# Patient Record
Sex: Male | Born: 1935 | Race: White | Hispanic: No | Smoking: Former smoker
Health system: Southern US, Community
[De-identification: ages and names within clinical notes are randomized; demographics above are authoritative.]

## PROBLEM LIST (undated history)

## (undated) DIAGNOSIS — D61818 Other pancytopenia: Secondary | ICD-10-CM

## (undated) DIAGNOSIS — IMO0002 Reserved for concepts with insufficient information to code with codable children: Secondary | ICD-10-CM

## (undated) DIAGNOSIS — Z952 Presence of prosthetic heart valve: Secondary | ICD-10-CM

## (undated) DIAGNOSIS — I350 Nonrheumatic aortic (valve) stenosis: Secondary | ICD-10-CM

## (undated) DIAGNOSIS — N183 Chronic kidney disease, stage 3 unspecified: Secondary | ICD-10-CM

## (undated) DIAGNOSIS — L0390 Cellulitis, unspecified: Secondary | ICD-10-CM

## (undated) DIAGNOSIS — E7800 Pure hypercholesterolemia, unspecified: Secondary | ICD-10-CM

## (undated) DIAGNOSIS — I5032 Chronic diastolic (congestive) heart failure: Secondary | ICD-10-CM

## (undated) DIAGNOSIS — E039 Hypothyroidism, unspecified: Secondary | ICD-10-CM

## (undated) DIAGNOSIS — I480 Paroxysmal atrial fibrillation: Secondary | ICD-10-CM

## (undated) DIAGNOSIS — I639 Cerebral infarction, unspecified: Secondary | ICD-10-CM

## (undated) DIAGNOSIS — L03116 Cellulitis of left lower limb: Secondary | ICD-10-CM

## (undated) DIAGNOSIS — R161 Splenomegaly, not elsewhere classified: Secondary | ICD-10-CM

## (undated) DIAGNOSIS — M7120 Synovial cyst of popliteal space [Baker], unspecified knee: Secondary | ICD-10-CM

## (undated) DIAGNOSIS — Z9289 Personal history of other medical treatment: Secondary | ICD-10-CM

## (undated) DIAGNOSIS — G252 Other specified forms of tremor: Secondary | ICD-10-CM

## (undated) DIAGNOSIS — R9431 Abnormal electrocardiogram [ECG] [EKG]: Secondary | ICD-10-CM

## (undated) DIAGNOSIS — R7881 Bacteremia: Secondary | ICD-10-CM

## (undated) DIAGNOSIS — K5790 Diverticulosis of intestine, part unspecified, without perforation or abscess without bleeding: Secondary | ICD-10-CM

## (undated) DIAGNOSIS — D696 Thrombocytopenia, unspecified: Secondary | ICD-10-CM

## (undated) DIAGNOSIS — I959 Hypotension, unspecified: Secondary | ICD-10-CM

## (undated) DIAGNOSIS — M25469 Effusion, unspecified knee: Secondary | ICD-10-CM

## (undated) DIAGNOSIS — D72819 Decreased white blood cell count, unspecified: Secondary | ICD-10-CM

## (undated) DIAGNOSIS — Z8719 Personal history of other diseases of the digestive system: Secondary | ICD-10-CM

## (undated) DIAGNOSIS — I10 Essential (primary) hypertension: Secondary | ICD-10-CM

## (undated) DIAGNOSIS — C140 Malignant neoplasm of pharynx, unspecified: Secondary | ICD-10-CM

## (undated) DIAGNOSIS — K219 Gastro-esophageal reflux disease without esophagitis: Secondary | ICD-10-CM

## (undated) DIAGNOSIS — F419 Anxiety disorder, unspecified: Secondary | ICD-10-CM

## (undated) DIAGNOSIS — K635 Polyp of colon: Secondary | ICD-10-CM

## (undated) DIAGNOSIS — R001 Bradycardia, unspecified: Secondary | ICD-10-CM

## (undated) HISTORY — DX: Effusion, unspecified knee: M25.469

## (undated) HISTORY — DX: Polyp of colon: K63.5

## (undated) HISTORY — DX: Anxiety disorder, unspecified: F41.9

## (undated) HISTORY — DX: Abnormal electrocardiogram (ECG) (EKG): R94.31

## (undated) HISTORY — DX: Other specified forms of tremor: G25.2

## (undated) HISTORY — PX: EXCISIONAL HEMORRHOIDECTOMY: SHX1541

## (undated) HISTORY — DX: Malignant neoplasm of pharynx, unspecified: C14.0

## (undated) HISTORY — DX: Synovial cyst of popliteal space (Baker), unspecified knee: M71.20

## (undated) HISTORY — PX: SURGERY SCROTAL / TESTICULAR: SUR1316

## (undated) HISTORY — PX: CHOLECYSTECTOMY: SHX55

## (undated) HISTORY — PX: CARDIAC CATHETERIZATION: SHX172

## (undated) HISTORY — PX: OTHER SURGICAL HISTORY: SHX169

## (undated) HISTORY — DX: Cerebral infarction, unspecified: I63.9

## (undated) HISTORY — DX: Paroxysmal atrial fibrillation: I48.0

## (undated) HISTORY — DX: Other pancytopenia: D61.818

## (undated) HISTORY — PX: CARDIAC VALVE REPLACEMENT: SHX585

## (undated) HISTORY — DX: Splenomegaly, not elsewhere classified: R16.1

## (undated) HISTORY — DX: Diverticulosis of intestine, part unspecified, without perforation or abscess without bleeding: K57.90

## (undated) HISTORY — DX: Reserved for concepts with insufficient information to code with codable children: IMO0002

## (undated) HISTORY — DX: Essential (primary) hypertension: I10

## (undated) HISTORY — DX: Cellulitis of left lower limb: L03.116

## (undated) HISTORY — DX: Decreased white blood cell count, unspecified: D72.819

## (undated) HISTORY — DX: Hypothyroidism, unspecified: E03.9

## (undated) HISTORY — DX: Gastro-esophageal reflux disease without esophagitis: K21.9

## (undated) HISTORY — DX: Thrombocytopenia, unspecified: D69.6

## (undated) HISTORY — DX: Nonrheumatic aortic (valve) stenosis: I35.0

## (undated) HISTORY — PX: INGUINAL HERNIA REPAIR: SUR1180

## (undated) HISTORY — DX: Hypotension, unspecified: I95.9

## (undated) HISTORY — DX: Bradycardia, unspecified: R00.1

---

## 1978-09-09 HISTORY — PX: MICROLARYNGOSCOPY WITH CO2 LASER AND EXCISION OF VOCAL CORD LESION: SHX5970

## 1997-10-05 ENCOUNTER — Inpatient Hospital Stay (HOSPITAL_COMMUNITY): Admission: EM | Admit: 1997-10-05 | Discharge: 1997-10-06 | Payer: Self-pay | Admitting: Internal Medicine

## 1998-05-06 ENCOUNTER — Other Ambulatory Visit: Admission: RE | Admit: 1998-05-06 | Discharge: 1998-05-06 | Payer: Self-pay | Admitting: Internal Medicine

## 1998-05-09 ENCOUNTER — Ambulatory Visit (HOSPITAL_COMMUNITY): Admission: RE | Admit: 1998-05-09 | Discharge: 1998-05-09 | Payer: Self-pay | Admitting: Internal Medicine

## 1998-05-09 ENCOUNTER — Encounter: Payer: Self-pay | Admitting: Internal Medicine

## 1998-08-10 ENCOUNTER — Other Ambulatory Visit: Admission: RE | Admit: 1998-08-10 | Discharge: 1998-08-10 | Payer: Self-pay | Admitting: Oncology

## 1999-04-20 ENCOUNTER — Inpatient Hospital Stay (HOSPITAL_COMMUNITY): Admission: EM | Admit: 1999-04-20 | Discharge: 1999-04-22 | Payer: Self-pay | Admitting: Emergency Medicine

## 1999-04-20 ENCOUNTER — Encounter: Payer: Self-pay | Admitting: Emergency Medicine

## 1999-05-09 ENCOUNTER — Encounter (INDEPENDENT_AMBULATORY_CARE_PROVIDER_SITE_OTHER): Payer: Self-pay

## 1999-05-09 ENCOUNTER — Other Ambulatory Visit: Admission: RE | Admit: 1999-05-09 | Discharge: 1999-05-09 | Payer: Self-pay | Admitting: Internal Medicine

## 2000-07-18 ENCOUNTER — Inpatient Hospital Stay (HOSPITAL_COMMUNITY): Admission: EM | Admit: 2000-07-18 | Discharge: 2000-07-19 | Payer: Self-pay | Admitting: Emergency Medicine

## 2000-07-19 ENCOUNTER — Encounter: Payer: Self-pay | Admitting: Cardiology

## 2000-08-30 ENCOUNTER — Other Ambulatory Visit: Admission: RE | Admit: 2000-08-30 | Discharge: 2000-08-30 | Payer: Self-pay | Admitting: Internal Medicine

## 2000-08-30 ENCOUNTER — Encounter (INDEPENDENT_AMBULATORY_CARE_PROVIDER_SITE_OTHER): Payer: Self-pay | Admitting: Specialist

## 2002-04-07 ENCOUNTER — Ambulatory Visit (HOSPITAL_COMMUNITY): Admission: RE | Admit: 2002-04-07 | Discharge: 2002-04-07 | Payer: Self-pay | Admitting: Physician Assistant

## 2002-04-07 ENCOUNTER — Encounter: Payer: Self-pay | Admitting: Internal Medicine

## 2002-05-18 ENCOUNTER — Inpatient Hospital Stay (HOSPITAL_COMMUNITY): Admission: AD | Admit: 2002-05-18 | Discharge: 2002-05-20 | Payer: Self-pay | Admitting: Cardiology

## 2002-09-12 ENCOUNTER — Ambulatory Visit (HOSPITAL_COMMUNITY): Admission: RE | Admit: 2002-09-12 | Discharge: 2002-09-12 | Payer: Self-pay | Admitting: Neurosurgery

## 2002-09-12 ENCOUNTER — Encounter: Payer: Self-pay | Admitting: Neurosurgery

## 2002-10-29 ENCOUNTER — Encounter: Payer: Self-pay | Admitting: Neurosurgery

## 2002-10-29 ENCOUNTER — Encounter: Admission: RE | Admit: 2002-10-29 | Discharge: 2002-10-29 | Payer: Self-pay | Admitting: Neurosurgery

## 2002-11-13 ENCOUNTER — Encounter: Admission: RE | Admit: 2002-11-13 | Discharge: 2002-11-13 | Payer: Self-pay | Admitting: Neurosurgery

## 2002-12-07 ENCOUNTER — Encounter: Admission: RE | Admit: 2002-12-07 | Discharge: 2002-12-07 | Payer: Self-pay | Admitting: Neurosurgery

## 2002-12-24 ENCOUNTER — Inpatient Hospital Stay (HOSPITAL_COMMUNITY): Admission: AD | Admit: 2002-12-24 | Discharge: 2002-12-25 | Payer: Self-pay | Admitting: Cardiology

## 2003-03-05 ENCOUNTER — Inpatient Hospital Stay (HOSPITAL_COMMUNITY): Admission: AD | Admit: 2003-03-05 | Discharge: 2003-03-09 | Payer: Self-pay | Admitting: Cardiology

## 2003-04-28 ENCOUNTER — Encounter: Admission: RE | Admit: 2003-04-28 | Discharge: 2003-04-28 | Payer: Self-pay | Admitting: Internal Medicine

## 2003-09-10 ENCOUNTER — Inpatient Hospital Stay (HOSPITAL_COMMUNITY): Admission: EM | Admit: 2003-09-10 | Discharge: 2003-09-15 | Payer: Self-pay | Admitting: Emergency Medicine

## 2003-09-10 ENCOUNTER — Ambulatory Visit: Payer: Self-pay | Admitting: Infectious Diseases

## 2003-11-25 ENCOUNTER — Ambulatory Visit: Payer: Self-pay | Admitting: Cardiology

## 2003-11-25 ENCOUNTER — Ambulatory Visit: Payer: Self-pay

## 2003-12-16 ENCOUNTER — Ambulatory Visit: Payer: Self-pay | Admitting: Cardiology

## 2004-01-06 ENCOUNTER — Ambulatory Visit: Payer: Self-pay | Admitting: *Deleted

## 2004-01-11 ENCOUNTER — Ambulatory Visit: Payer: Self-pay

## 2004-02-14 ENCOUNTER — Ambulatory Visit: Payer: Self-pay | Admitting: *Deleted

## 2004-03-13 ENCOUNTER — Ambulatory Visit: Payer: Self-pay | Admitting: Cardiology

## 2004-03-27 ENCOUNTER — Ambulatory Visit: Payer: Self-pay | Admitting: Cardiology

## 2004-04-06 ENCOUNTER — Ambulatory Visit: Payer: Self-pay | Admitting: Internal Medicine

## 2004-04-17 ENCOUNTER — Ambulatory Visit: Payer: Self-pay | Admitting: Cardiology

## 2004-04-25 ENCOUNTER — Ambulatory Visit: Payer: Self-pay | Admitting: Cardiology

## 2004-05-16 ENCOUNTER — Ambulatory Visit: Payer: Self-pay | Admitting: Cardiology

## 2004-05-24 ENCOUNTER — Ambulatory Visit: Payer: Self-pay | Admitting: Cardiology

## 2004-05-30 ENCOUNTER — Ambulatory Visit: Payer: Self-pay | Admitting: Cardiology

## 2004-06-20 ENCOUNTER — Ambulatory Visit: Payer: Self-pay | Admitting: Internal Medicine

## 2004-07-13 ENCOUNTER — Ambulatory Visit: Payer: Self-pay | Admitting: Cardiology

## 2004-08-01 ENCOUNTER — Ambulatory Visit: Payer: Self-pay | Admitting: Internal Medicine

## 2004-08-10 ENCOUNTER — Ambulatory Visit: Payer: Self-pay | Admitting: Cardiology

## 2004-08-21 ENCOUNTER — Ambulatory Visit: Payer: Self-pay | Admitting: Internal Medicine

## 2004-09-07 ENCOUNTER — Ambulatory Visit: Payer: Self-pay | Admitting: Internal Medicine

## 2004-09-28 ENCOUNTER — Ambulatory Visit: Payer: Self-pay | Admitting: Cardiology

## 2004-10-06 ENCOUNTER — Ambulatory Visit: Payer: Self-pay | Admitting: Cardiology

## 2004-10-06 ENCOUNTER — Encounter: Admission: RE | Admit: 2004-10-06 | Discharge: 2004-10-06 | Payer: Self-pay | Admitting: Neurosurgery

## 2004-10-10 ENCOUNTER — Ambulatory Visit: Payer: Self-pay | Admitting: Internal Medicine

## 2004-10-16 ENCOUNTER — Ambulatory Visit: Payer: Self-pay | Admitting: Cardiology

## 2004-10-25 ENCOUNTER — Ambulatory Visit: Payer: Self-pay | Admitting: Cardiology

## 2004-10-25 ENCOUNTER — Encounter: Admission: RE | Admit: 2004-10-25 | Discharge: 2004-10-25 | Payer: Self-pay | Admitting: Neurosurgery

## 2004-11-01 ENCOUNTER — Ambulatory Visit: Payer: Self-pay | Admitting: Cardiology

## 2004-11-08 ENCOUNTER — Encounter: Admission: RE | Admit: 2004-11-08 | Discharge: 2004-11-08 | Payer: Self-pay | Admitting: Neurosurgery

## 2004-11-08 ENCOUNTER — Ambulatory Visit: Payer: Self-pay | Admitting: *Deleted

## 2004-11-15 ENCOUNTER — Ambulatory Visit: Payer: Self-pay | Admitting: Cardiology

## 2004-11-27 ENCOUNTER — Ambulatory Visit: Payer: Self-pay | Admitting: Cardiology

## 2004-12-13 ENCOUNTER — Ambulatory Visit: Payer: Self-pay | Admitting: *Deleted

## 2004-12-13 ENCOUNTER — Ambulatory Visit: Payer: Self-pay | Admitting: Cardiology

## 2004-12-13 ENCOUNTER — Ambulatory Visit: Payer: Self-pay

## 2004-12-27 ENCOUNTER — Ambulatory Visit: Payer: Self-pay | Admitting: Cardiology

## 2004-12-28 ENCOUNTER — Ambulatory Visit: Payer: Self-pay | Admitting: Cardiology

## 2005-02-08 ENCOUNTER — Ambulatory Visit: Payer: Self-pay | Admitting: Cardiology

## 2005-02-22 ENCOUNTER — Ambulatory Visit: Payer: Self-pay | Admitting: Internal Medicine

## 2005-03-15 ENCOUNTER — Ambulatory Visit: Payer: Self-pay | Admitting: Cardiology

## 2005-03-26 ENCOUNTER — Ambulatory Visit: Payer: Self-pay | Admitting: Internal Medicine

## 2005-03-29 ENCOUNTER — Ambulatory Visit: Payer: Self-pay | Admitting: Cardiology

## 2005-04-11 ENCOUNTER — Ambulatory Visit: Payer: Self-pay | Admitting: Cardiology

## 2005-05-02 ENCOUNTER — Ambulatory Visit: Payer: Self-pay | Admitting: Cardiology

## 2005-05-16 ENCOUNTER — Ambulatory Visit: Payer: Self-pay | Admitting: Cardiology

## 2005-05-30 ENCOUNTER — Ambulatory Visit: Payer: Self-pay | Admitting: Cardiology

## 2005-06-07 ENCOUNTER — Encounter: Payer: Self-pay | Admitting: Cardiology

## 2005-06-07 ENCOUNTER — Ambulatory Visit: Payer: Self-pay

## 2005-06-27 ENCOUNTER — Ambulatory Visit: Payer: Self-pay | Admitting: Cardiovascular Disease

## 2005-07-02 ENCOUNTER — Ambulatory Visit: Payer: Self-pay | Admitting: Internal Medicine

## 2005-07-17 ENCOUNTER — Encounter: Admission: RE | Admit: 2005-07-17 | Discharge: 2005-07-17 | Payer: Self-pay | Admitting: Neurosurgery

## 2005-07-25 ENCOUNTER — Ambulatory Visit: Payer: Self-pay | Admitting: Cardiovascular Disease

## 2005-08-14 ENCOUNTER — Encounter: Admission: RE | Admit: 2005-08-14 | Discharge: 2005-08-14 | Payer: Self-pay | Admitting: Neurosurgery

## 2005-08-14 ENCOUNTER — Ambulatory Visit: Payer: Self-pay | Admitting: Cardiology

## 2005-08-23 ENCOUNTER — Ambulatory Visit: Payer: Self-pay | Admitting: Cardiology

## 2005-09-05 ENCOUNTER — Encounter: Admission: RE | Admit: 2005-09-05 | Discharge: 2005-09-05 | Payer: Self-pay | Admitting: Neurosurgery

## 2005-09-05 ENCOUNTER — Ambulatory Visit: Payer: Self-pay | Admitting: Cardiology

## 2005-09-14 ENCOUNTER — Ambulatory Visit: Payer: Self-pay | Admitting: Cardiology

## 2005-09-25 ENCOUNTER — Encounter: Admission: RE | Admit: 2005-09-25 | Discharge: 2005-09-25 | Payer: Self-pay | Admitting: Neurosurgery

## 2005-09-25 ENCOUNTER — Ambulatory Visit: Payer: Self-pay | Admitting: Cardiology

## 2005-10-04 ENCOUNTER — Ambulatory Visit: Payer: Self-pay | Admitting: Cardiology

## 2005-10-18 ENCOUNTER — Ambulatory Visit: Payer: Self-pay | Admitting: Cardiology

## 2005-11-07 ENCOUNTER — Ambulatory Visit: Payer: Self-pay | Admitting: Cardiology

## 2005-12-04 ENCOUNTER — Ambulatory Visit: Payer: Self-pay | Admitting: Cardiology

## 2006-01-10 ENCOUNTER — Emergency Department (HOSPITAL_COMMUNITY): Admission: EM | Admit: 2006-01-10 | Discharge: 2006-01-11 | Payer: Self-pay | Admitting: Emergency Medicine

## 2006-01-14 ENCOUNTER — Ambulatory Visit: Payer: Self-pay | Admitting: Cardiology

## 2006-02-07 ENCOUNTER — Ambulatory Visit: Payer: Self-pay | Admitting: Cardiology

## 2006-02-20 ENCOUNTER — Ambulatory Visit: Payer: Self-pay | Admitting: Cardiology

## 2006-02-28 ENCOUNTER — Ambulatory Visit: Payer: Self-pay | Admitting: Cardiology

## 2006-03-18 ENCOUNTER — Ambulatory Visit: Payer: Self-pay | Admitting: Cardiology

## 2006-03-28 ENCOUNTER — Ambulatory Visit: Payer: Self-pay | Admitting: Cardiology

## 2006-04-04 ENCOUNTER — Ambulatory Visit: Payer: Self-pay | Admitting: Cardiology

## 2006-04-18 ENCOUNTER — Ambulatory Visit: Payer: Self-pay | Admitting: Cardiology

## 2006-05-01 ENCOUNTER — Ambulatory Visit: Payer: Self-pay | Admitting: *Deleted

## 2006-05-01 ENCOUNTER — Ambulatory Visit: Payer: Self-pay | Admitting: Cardiology

## 2006-05-13 DIAGNOSIS — IMO0002 Reserved for concepts with insufficient information to code with codable children: Secondary | ICD-10-CM | POA: Insufficient documentation

## 2006-05-13 DIAGNOSIS — F411 Generalized anxiety disorder: Secondary | ICD-10-CM | POA: Insufficient documentation

## 2006-05-13 DIAGNOSIS — D696 Thrombocytopenia, unspecified: Secondary | ICD-10-CM

## 2006-05-13 DIAGNOSIS — R161 Splenomegaly, not elsewhere classified: Secondary | ICD-10-CM | POA: Insufficient documentation

## 2006-05-13 DIAGNOSIS — I6350 Cerebral infarction due to unspecified occlusion or stenosis of unspecified cerebral artery: Secondary | ICD-10-CM | POA: Insufficient documentation

## 2006-05-13 DIAGNOSIS — M109 Gout, unspecified: Secondary | ICD-10-CM | POA: Insufficient documentation

## 2006-05-13 DIAGNOSIS — I10 Essential (primary) hypertension: Secondary | ICD-10-CM | POA: Insufficient documentation

## 2006-05-14 ENCOUNTER — Ambulatory Visit: Payer: Self-pay | Admitting: Internal Medicine

## 2006-05-20 ENCOUNTER — Ambulatory Visit: Payer: Self-pay | Admitting: Internal Medicine

## 2006-05-20 LAB — CONVERTED CEMR LAB
Basophils Relative: 0.1 % (ref 0.0–1.0)
Eosinophils Relative: 1.5 % (ref 0.0–5.0)
HCT: 40.5 % (ref 39.0–52.0)
Hemoglobin: 14 g/dL (ref 13.0–17.0)
Lymphocytes Relative: 20.3 % (ref 12.0–46.0)
Monocytes Absolute: 0.2 10*3/uL (ref 0.2–0.7)
Monocytes Relative: 4.4 % (ref 3.0–11.0)
Neutro Abs: 2.6 10*3/uL (ref 1.4–7.7)
Neutrophils Relative %: 73.7 % (ref 43.0–77.0)
Prothrombin Time: 22.4 s — ABNORMAL HIGH (ref 10.0–14.0)
RDW: 15.9 % — ABNORMAL HIGH (ref 11.5–14.6)
WBC: 3.6 10*3/uL — ABNORMAL LOW (ref 4.5–10.5)

## 2006-05-24 ENCOUNTER — Ambulatory Visit: Payer: Self-pay | Admitting: Cardiology

## 2006-05-28 ENCOUNTER — Ambulatory Visit: Payer: Self-pay | Admitting: Cardiology

## 2006-06-11 ENCOUNTER — Ambulatory Visit: Payer: Self-pay | Admitting: Cardiology

## 2006-06-18 ENCOUNTER — Ambulatory Visit: Payer: Self-pay | Admitting: Cardiology

## 2006-07-16 ENCOUNTER — Ambulatory Visit: Payer: Self-pay | Admitting: Cardiology

## 2006-08-13 ENCOUNTER — Ambulatory Visit: Payer: Self-pay | Admitting: Cardiology

## 2006-09-10 ENCOUNTER — Ambulatory Visit: Payer: Self-pay | Admitting: Cardiology

## 2006-09-12 ENCOUNTER — Ambulatory Visit: Payer: Self-pay | Admitting: Cardiology

## 2006-10-09 ENCOUNTER — Ambulatory Visit: Payer: Self-pay | Admitting: Cardiology

## 2006-11-05 ENCOUNTER — Ambulatory Visit: Payer: Self-pay | Admitting: Internal Medicine

## 2006-11-05 DIAGNOSIS — M7120 Synovial cyst of popliteal space [Baker], unspecified knee: Secondary | ICD-10-CM

## 2006-11-11 ENCOUNTER — Encounter: Admission: RE | Admit: 2006-11-11 | Discharge: 2006-11-11 | Payer: Self-pay | Admitting: Internal Medicine

## 2006-11-12 ENCOUNTER — Encounter (INDEPENDENT_AMBULATORY_CARE_PROVIDER_SITE_OTHER): Payer: Self-pay | Admitting: *Deleted

## 2006-11-14 ENCOUNTER — Ambulatory Visit: Payer: Self-pay | Admitting: Cardiology

## 2006-11-14 LAB — CONVERTED CEMR LAB
Albumin: 4.5 g/dL (ref 3.5–5.2)
Alkaline Phosphatase: 80 units/L (ref 39–117)
BUN: 25 mg/dL — ABNORMAL HIGH (ref 6–23)
Basophils Absolute: 0 10*3/uL (ref 0.0–0.1)
Free T4: 0.9 ng/dL (ref 0.6–1.6)
GFR calc Af Amer: 53 mL/min
HCT: 42.2 % (ref 39.0–52.0)
Hemoglobin: 14.5 g/dL (ref 13.0–17.0)
Lymphocytes Relative: 23.7 % (ref 12.0–46.0)
MCHC: 34.4 g/dL (ref 30.0–36.0)
MCV: 88.9 fL (ref 78.0–100.0)
Monocytes Absolute: 0.3 10*3/uL (ref 0.2–0.7)
Monocytes Relative: 6.6 % (ref 3.0–11.0)
Neutro Abs: 2.7 10*3/uL (ref 1.4–7.7)
Neutrophils Relative %: 68 % (ref 43.0–77.0)
Potassium: 4.6 meq/L (ref 3.5–5.1)
Sodium: 141 meq/L (ref 135–145)
TSH: 6.81 microintl units/mL — ABNORMAL HIGH (ref 0.35–5.50)
Total Bilirubin: 0.9 mg/dL (ref 0.3–1.2)
Total Protein: 7.1 g/dL (ref 6.0–8.3)

## 2006-11-19 ENCOUNTER — Ambulatory Visit: Payer: Self-pay

## 2006-12-12 ENCOUNTER — Ambulatory Visit: Payer: Self-pay | Admitting: Cardiovascular Disease

## 2007-01-10 ENCOUNTER — Ambulatory Visit: Payer: Self-pay | Admitting: Cardiology

## 2007-01-13 ENCOUNTER — Telehealth (INDEPENDENT_AMBULATORY_CARE_PROVIDER_SITE_OTHER): Payer: Self-pay | Admitting: *Deleted

## 2007-01-14 ENCOUNTER — Ambulatory Visit: Payer: Self-pay | Admitting: Internal Medicine

## 2007-01-14 DIAGNOSIS — G250 Essential tremor: Secondary | ICD-10-CM

## 2007-01-14 DIAGNOSIS — L0390 Cellulitis, unspecified: Secondary | ICD-10-CM

## 2007-01-14 DIAGNOSIS — G252 Other specified forms of tremor: Secondary | ICD-10-CM

## 2007-01-14 DIAGNOSIS — L0291 Cutaneous abscess, unspecified: Secondary | ICD-10-CM

## 2007-01-16 ENCOUNTER — Telehealth (INDEPENDENT_AMBULATORY_CARE_PROVIDER_SITE_OTHER): Payer: Self-pay | Admitting: *Deleted

## 2007-01-17 ENCOUNTER — Telehealth (INDEPENDENT_AMBULATORY_CARE_PROVIDER_SITE_OTHER): Payer: Self-pay | Admitting: *Deleted

## 2007-01-17 LAB — CONVERTED CEMR LAB
Basophils Relative: 0.3 % (ref 0.0–1.0)
Eosinophils Relative: 0.9 % (ref 0.0–5.0)
Hemoglobin: 12.4 g/dL — ABNORMAL LOW (ref 13.0–17.0)
Lymphocytes Relative: 16.2 % (ref 12.0–46.0)
Monocytes Relative: 3.2 % (ref 3.0–11.0)
Neutro Abs: 2.7 10*3/uL (ref 1.4–7.7)
Platelets: 76 10*3/uL — ABNORMAL LOW (ref 150–400)
RDW: 14.9 % — ABNORMAL HIGH (ref 11.5–14.6)
Uric Acid, Serum: 4.5 mg/dL (ref 2.4–7.0)
WBC: 3.5 10*3/uL — ABNORMAL LOW (ref 4.5–10.5)

## 2007-02-10 ENCOUNTER — Ambulatory Visit: Payer: Self-pay | Admitting: Cardiology

## 2007-02-10 ENCOUNTER — Ambulatory Visit: Payer: Self-pay | Admitting: Internal Medicine

## 2007-02-18 ENCOUNTER — Ambulatory Visit: Payer: Self-pay | Admitting: Cardiovascular Disease

## 2007-03-04 ENCOUNTER — Ambulatory Visit: Payer: Self-pay | Admitting: Cardiology

## 2007-03-18 ENCOUNTER — Ambulatory Visit: Payer: Self-pay | Admitting: Cardiology

## 2007-04-15 ENCOUNTER — Ambulatory Visit: Payer: Self-pay | Admitting: Cardiovascular Disease

## 2007-04-15 ENCOUNTER — Ambulatory Visit: Payer: Self-pay | Admitting: Cardiology

## 2007-04-15 LAB — CONVERTED CEMR LAB
ALT: 32 units/L (ref 0–53)
AST: 30 units/L (ref 0–37)
Alkaline Phosphatase: 71 units/L (ref 39–117)
Bilirubin, Direct: 0.1 mg/dL (ref 0.0–0.3)
CO2: 28 meq/L (ref 19–32)
Chloride: 106 meq/L (ref 96–112)
Glucose, Bld: 127 mg/dL — ABNORMAL HIGH (ref 70–99)
Potassium: 4.3 meq/L (ref 3.5–5.1)
Sodium: 140 meq/L (ref 135–145)
TSH: 7.04 microintl units/mL — ABNORMAL HIGH (ref 0.35–5.50)
Total Bilirubin: 0.8 mg/dL (ref 0.3–1.2)
Total Protein: 6.7 g/dL (ref 6.0–8.3)

## 2007-04-28 ENCOUNTER — Ambulatory Visit: Payer: Self-pay

## 2007-04-28 ENCOUNTER — Encounter: Payer: Self-pay | Admitting: Cardiology

## 2007-05-13 ENCOUNTER — Ambulatory Visit: Payer: Self-pay | Admitting: Cardiovascular Disease

## 2007-05-23 ENCOUNTER — Emergency Department (HOSPITAL_COMMUNITY): Admission: EM | Admit: 2007-05-23 | Discharge: 2007-05-23 | Payer: Self-pay | Admitting: Emergency Medicine

## 2007-05-27 ENCOUNTER — Ambulatory Visit: Payer: Self-pay | Admitting: Internal Medicine

## 2007-06-10 ENCOUNTER — Ambulatory Visit: Payer: Self-pay | Admitting: Cardiovascular Disease

## 2007-06-12 ENCOUNTER — Ambulatory Visit: Payer: Self-pay | Admitting: Oncology

## 2007-06-12 ENCOUNTER — Ambulatory Visit: Payer: Self-pay | Admitting: Cardiology

## 2007-06-12 ENCOUNTER — Ambulatory Visit: Payer: Self-pay | Admitting: Internal Medicine

## 2007-06-12 ENCOUNTER — Inpatient Hospital Stay (HOSPITAL_COMMUNITY): Admission: AD | Admit: 2007-06-12 | Discharge: 2007-06-18 | Payer: Self-pay | Admitting: Internal Medicine

## 2007-06-12 ENCOUNTER — Telehealth (INDEPENDENT_AMBULATORY_CARE_PROVIDER_SITE_OTHER): Payer: Self-pay | Admitting: *Deleted

## 2007-06-13 ENCOUNTER — Encounter: Payer: Self-pay | Admitting: Internal Medicine

## 2007-06-20 ENCOUNTER — Ambulatory Visit: Payer: Self-pay | Admitting: Oncology

## 2007-06-23 ENCOUNTER — Ambulatory Visit: Payer: Self-pay | Admitting: Internal Medicine

## 2007-06-23 ENCOUNTER — Telehealth (INDEPENDENT_AMBULATORY_CARE_PROVIDER_SITE_OTHER): Payer: Self-pay | Admitting: *Deleted

## 2007-06-23 DIAGNOSIS — M79606 Pain in leg, unspecified: Secondary | ICD-10-CM

## 2007-06-23 DIAGNOSIS — D649 Anemia, unspecified: Secondary | ICD-10-CM

## 2007-06-25 ENCOUNTER — Encounter: Payer: Self-pay | Admitting: Internal Medicine

## 2007-06-26 ENCOUNTER — Encounter: Payer: Self-pay | Admitting: Internal Medicine

## 2007-06-26 ENCOUNTER — Ambulatory Visit: Payer: Self-pay

## 2007-06-26 ENCOUNTER — Ambulatory Visit: Payer: Self-pay | Admitting: Cardiology

## 2007-06-27 ENCOUNTER — Telehealth: Payer: Self-pay | Admitting: Internal Medicine

## 2007-06-27 LAB — CONVERTED CEMR LAB
Basophils Absolute: 0.3 10*3/uL — ABNORMAL HIGH (ref 0.0–0.1)
Basophils Relative: 4.9 % — ABNORMAL HIGH (ref 0.0–1.0)
HCT: 34.1 % — ABNORMAL LOW (ref 39.0–52.0)
Hemoglobin: 11.5 g/dL — ABNORMAL LOW (ref 13.0–17.0)
Lymphocytes Relative: 13.6 % (ref 12.0–46.0)
MCHC: 33.7 g/dL (ref 30.0–36.0)
MCV: 87.5 fL (ref 78.0–100.0)
Monocytes Absolute: 0.3 10*3/uL (ref 0.1–1.0)
Neutro Abs: 4.1 10*3/uL (ref 1.4–7.7)
RBC: 3.9 M/uL — ABNORMAL LOW (ref 4.22–5.81)
RDW: 16.2 % — ABNORMAL HIGH (ref 11.5–14.6)

## 2007-07-21 ENCOUNTER — Ambulatory Visit: Payer: Self-pay | Admitting: Cardiology

## 2007-07-21 ENCOUNTER — Ambulatory Visit: Payer: Self-pay | Admitting: Internal Medicine

## 2007-07-21 DIAGNOSIS — S81809A Unspecified open wound, unspecified lower leg, initial encounter: Secondary | ICD-10-CM | POA: Insufficient documentation

## 2007-07-31 ENCOUNTER — Encounter: Payer: Self-pay | Admitting: Internal Medicine

## 2007-07-31 ENCOUNTER — Telehealth (INDEPENDENT_AMBULATORY_CARE_PROVIDER_SITE_OTHER): Payer: Self-pay | Admitting: *Deleted

## 2007-07-31 LAB — CONVERTED CEMR LAB
Eosinophils Absolute: 0 10*3/uL (ref 0.0–0.7)
HCT: 38.9 % — ABNORMAL LOW (ref 39.0–52.0)
Monocytes Absolute: 0.2 10*3/uL (ref 0.1–1.0)
Monocytes Relative: 5 % (ref 3.0–12.0)
Neutrophils Relative %: 73.8 % (ref 43.0–77.0)
Platelets: 68 10*3/uL — ABNORMAL LOW (ref 150–400)
RDW: 17.5 % — ABNORMAL HIGH (ref 11.5–14.6)

## 2007-08-04 ENCOUNTER — Ambulatory Visit: Payer: Self-pay | Admitting: Internal Medicine

## 2007-08-04 DIAGNOSIS — D72819 Decreased white blood cell count, unspecified: Secondary | ICD-10-CM | POA: Insufficient documentation

## 2007-08-08 ENCOUNTER — Encounter: Payer: Self-pay | Admitting: Internal Medicine

## 2007-08-08 ENCOUNTER — Ambulatory Visit: Payer: Self-pay | Admitting: Oncology

## 2007-08-08 LAB — CBC & DIFF AND RETIC
Basophils Absolute: 0 10*3/uL (ref 0.0–0.1)
EOS%: 1.6 % (ref 0.0–7.0)
Eosinophils Absolute: 0 10*3/uL (ref 0.0–0.5)
HCT: 36.4 % — ABNORMAL LOW (ref 38.7–49.9)
HGB: 12.8 g/dL — ABNORMAL LOW (ref 13.0–17.1)
IRF: 0.32 (ref 0.070–0.380)
MCH: 30.5 pg (ref 28.0–33.4)
MCV: 87 fL (ref 81.6–98.0)
MONO%: 7 % (ref 0.0–13.0)
NEUT#: 1.4 10*3/uL — ABNORMAL LOW (ref 1.5–6.5)
NEUT%: 65.7 % (ref 40.0–75.0)
RETIC #: 37.7 10*3/uL (ref 31.8–103.9)
lymph#: 0.5 10*3/uL — ABNORMAL LOW (ref 0.9–3.3)

## 2007-08-08 LAB — COMPREHENSIVE METABOLIC PANEL
ALT: 29 U/L (ref 0–53)
AST: 29 U/L (ref 0–37)
Albumin: 4.3 g/dL (ref 3.5–5.2)
CO2: 25 mEq/L (ref 19–32)
Calcium: 8.7 mg/dL (ref 8.4–10.5)
Chloride: 106 mEq/L (ref 96–112)
Creatinine, Ser: 1.44 mg/dL (ref 0.40–1.50)
Potassium: 4.3 mEq/L (ref 3.5–5.3)

## 2007-08-08 LAB — MORPHOLOGY

## 2007-08-08 LAB — LACTATE DEHYDROGENASE: LDH: 141 U/L (ref 94–250)

## 2007-08-08 LAB — IRON AND TIBC: UIBC: 210 ug/dL

## 2007-09-08 ENCOUNTER — Ambulatory Visit: Payer: Self-pay | Admitting: Cardiology

## 2007-11-05 ENCOUNTER — Ambulatory Visit: Payer: Self-pay | Admitting: Cardiology

## 2007-11-05 LAB — CONVERTED CEMR LAB
Alkaline Phosphatase: 82 units/L (ref 39–117)
Bilirubin, Direct: 0.1 mg/dL (ref 0.0–0.3)
Chloride: 107 meq/L (ref 96–112)
GFR calc Af Amer: 53 mL/min
GFR calc non Af Amer: 44 mL/min
Glucose, Bld: 90 mg/dL (ref 70–99)
Potassium: 5.1 meq/L (ref 3.5–5.1)
Sodium: 143 meq/L (ref 135–145)

## 2007-12-03 ENCOUNTER — Ambulatory Visit: Payer: Self-pay | Admitting: Oncology

## 2007-12-08 ENCOUNTER — Encounter: Payer: Self-pay | Admitting: Internal Medicine

## 2007-12-08 LAB — CBC WITH DIFFERENTIAL/PLATELET
Basophils Absolute: 0 10*3/uL (ref 0.0–0.1)
EOS%: 1.8 % (ref 0.0–7.0)
Eosinophils Absolute: 0.1 10*3/uL (ref 0.0–0.5)
HCT: 37.6 % — ABNORMAL LOW (ref 38.7–49.9)
HGB: 13.2 g/dL (ref 13.0–17.1)
MCH: 31.5 pg (ref 28.0–33.4)
MCV: 89.9 fL (ref 81.6–98.0)
NEUT%: 66.8 % (ref 40.0–75.0)
lymph#: 0.9 10*3/uL (ref 0.9–3.3)

## 2008-02-25 ENCOUNTER — Ambulatory Visit: Payer: Self-pay | Admitting: Cardiology

## 2008-03-11 ENCOUNTER — Ambulatory Visit: Payer: Self-pay | Admitting: Internal Medicine

## 2008-03-11 DIAGNOSIS — M25469 Effusion, unspecified knee: Secondary | ICD-10-CM

## 2008-03-11 DIAGNOSIS — IMO0002 Reserved for concepts with insufficient information to code with codable children: Secondary | ICD-10-CM

## 2008-05-15 DIAGNOSIS — I359 Nonrheumatic aortic valve disorder, unspecified: Secondary | ICD-10-CM

## 2008-06-01 ENCOUNTER — Ambulatory Visit: Payer: Self-pay | Admitting: Oncology

## 2008-06-03 ENCOUNTER — Encounter: Payer: Self-pay | Admitting: Internal Medicine

## 2008-06-03 LAB — CBC WITH DIFFERENTIAL/PLATELET
BASO%: 0.4 % (ref 0.0–2.0)
Basophils Absolute: 0 10*3/uL (ref 0.0–0.1)
HCT: 35.3 % — ABNORMAL LOW (ref 38.4–49.9)
HGB: 12.1 g/dL — ABNORMAL LOW (ref 13.0–17.1)
MONO#: 0.1 10*3/uL (ref 0.1–0.9)
NEUT%: 76.3 % — ABNORMAL HIGH (ref 39.0–75.0)
WBC: 2.9 10*3/uL — ABNORMAL LOW (ref 4.0–10.3)
lymph#: 0.5 10*3/uL — ABNORMAL LOW (ref 0.9–3.3)

## 2008-06-08 ENCOUNTER — Ambulatory Visit: Payer: Self-pay | Admitting: Cardiology

## 2008-06-09 LAB — CONVERTED CEMR LAB
ALT: 20 units/L (ref 0–53)
AST: 24 units/L (ref 0–37)
Alkaline Phosphatase: 81 units/L (ref 39–117)
Bilirubin, Direct: 0.1 mg/dL (ref 0.0–0.3)
CO2: 28 meq/L (ref 19–32)
Glucose, Bld: 101 mg/dL — ABNORMAL HIGH (ref 70–99)
Potassium: 4.4 meq/L (ref 3.5–5.1)
Sodium: 142 meq/L (ref 135–145)
Total Bilirubin: 1 mg/dL (ref 0.3–1.2)

## 2008-06-15 ENCOUNTER — Telehealth: Payer: Self-pay | Admitting: Cardiology

## 2008-06-16 ENCOUNTER — Encounter: Payer: Self-pay | Admitting: Cardiology

## 2008-08-30 ENCOUNTER — Ambulatory Visit: Payer: Self-pay | Admitting: Internal Medicine

## 2008-09-10 ENCOUNTER — Encounter: Payer: Self-pay | Admitting: Internal Medicine

## 2008-09-14 ENCOUNTER — Ambulatory Visit: Payer: Self-pay | Admitting: Cardiology

## 2008-09-14 ENCOUNTER — Telehealth (INDEPENDENT_AMBULATORY_CARE_PROVIDER_SITE_OTHER): Payer: Self-pay | Admitting: *Deleted

## 2008-09-15 ENCOUNTER — Encounter: Payer: Self-pay | Admitting: Internal Medicine

## 2008-09-20 ENCOUNTER — Telehealth (INDEPENDENT_AMBULATORY_CARE_PROVIDER_SITE_OTHER): Payer: Self-pay | Admitting: *Deleted

## 2008-09-24 ENCOUNTER — Ambulatory Visit: Payer: Self-pay | Admitting: Internal Medicine

## 2008-09-28 LAB — CONVERTED CEMR LAB: Free T4: 0.9 ng/dL (ref 0.6–1.6)

## 2008-10-04 ENCOUNTER — Telehealth: Payer: Self-pay | Admitting: Internal Medicine

## 2008-10-06 ENCOUNTER — Ambulatory Visit: Payer: Self-pay | Admitting: Internal Medicine

## 2008-10-06 DIAGNOSIS — E118 Type 2 diabetes mellitus with unspecified complications: Secondary | ICD-10-CM

## 2008-10-06 DIAGNOSIS — R946 Abnormal results of thyroid function studies: Secondary | ICD-10-CM | POA: Insufficient documentation

## 2008-11-26 ENCOUNTER — Ambulatory Visit: Payer: Self-pay | Admitting: Oncology

## 2008-11-30 ENCOUNTER — Encounter: Payer: Self-pay | Admitting: Internal Medicine

## 2008-11-30 LAB — CBC WITH DIFFERENTIAL/PLATELET
BASO%: 0.2 % (ref 0.0–2.0)
MCHC: 34.8 g/dL (ref 32.0–36.0)
MONO#: 0.1 10*3/uL (ref 0.1–0.9)
RBC: 3.82 10*6/uL — ABNORMAL LOW (ref 4.20–5.82)
WBC: 3 10*3/uL — ABNORMAL LOW (ref 4.0–10.3)
lymph#: 0.6 10*3/uL — ABNORMAL LOW (ref 0.9–3.3)

## 2008-12-13 ENCOUNTER — Encounter: Payer: Self-pay | Admitting: Internal Medicine

## 2008-12-21 DIAGNOSIS — I2510 Atherosclerotic heart disease of native coronary artery without angina pectoris: Secondary | ICD-10-CM

## 2008-12-22 ENCOUNTER — Ambulatory Visit: Payer: Self-pay | Admitting: Cardiology

## 2008-12-27 ENCOUNTER — Ambulatory Visit: Payer: Self-pay | Admitting: Internal Medicine

## 2008-12-29 ENCOUNTER — Encounter (INDEPENDENT_AMBULATORY_CARE_PROVIDER_SITE_OTHER): Payer: Self-pay | Admitting: *Deleted

## 2008-12-29 ENCOUNTER — Telehealth (INDEPENDENT_AMBULATORY_CARE_PROVIDER_SITE_OTHER): Payer: Self-pay | Admitting: *Deleted

## 2008-12-29 LAB — CONVERTED CEMR LAB
Creatinine, Ser: 1.8 mg/dL — ABNORMAL HIGH (ref 0.4–1.5)
Hgb A1c MFr Bld: 5.4 % (ref 4.6–6.5)
Potassium: 4.7 meq/L (ref 3.5–5.1)

## 2009-01-05 ENCOUNTER — Ambulatory Visit: Payer: Self-pay | Admitting: Internal Medicine

## 2009-01-05 DIAGNOSIS — R7309 Other abnormal glucose: Secondary | ICD-10-CM | POA: Insufficient documentation

## 2009-03-02 ENCOUNTER — Ambulatory Visit: Payer: Self-pay | Admitting: Internal Medicine

## 2009-03-02 LAB — CONVERTED CEMR LAB
BUN: 34 mg/dL — ABNORMAL HIGH (ref 6–23)
TSH: 6.84 microintl units/mL — ABNORMAL HIGH (ref 0.35–5.50)

## 2009-03-09 ENCOUNTER — Ambulatory Visit: Payer: Self-pay | Admitting: Internal Medicine

## 2009-06-09 ENCOUNTER — Ambulatory Visit: Payer: Self-pay | Admitting: Cardiology

## 2009-06-28 ENCOUNTER — Encounter: Payer: Self-pay | Admitting: Cardiology

## 2009-06-28 ENCOUNTER — Ambulatory Visit: Payer: Self-pay | Admitting: Internal Medicine

## 2009-06-28 ENCOUNTER — Ambulatory Visit: Payer: Self-pay

## 2009-06-28 ENCOUNTER — Ambulatory Visit (HOSPITAL_COMMUNITY): Admission: RE | Admit: 2009-06-28 | Discharge: 2009-06-28 | Payer: Self-pay | Admitting: Cardiology

## 2009-06-30 ENCOUNTER — Telehealth: Payer: Self-pay | Admitting: Cardiology

## 2009-07-19 ENCOUNTER — Ambulatory Visit: Payer: Self-pay | Admitting: Internal Medicine

## 2009-07-25 ENCOUNTER — Ambulatory Visit: Payer: Self-pay | Admitting: Internal Medicine

## 2009-07-25 DIAGNOSIS — R7989 Other specified abnormal findings of blood chemistry: Secondary | ICD-10-CM | POA: Insufficient documentation

## 2009-08-16 ENCOUNTER — Ambulatory Visit: Payer: Self-pay | Admitting: Oncology

## 2009-08-18 ENCOUNTER — Encounter: Payer: Self-pay | Admitting: Internal Medicine

## 2009-08-18 LAB — CBC WITH DIFFERENTIAL/PLATELET
Basophils Absolute: 0 10*3/uL (ref 0.0–0.1)
EOS%: 1.4 % (ref 0.0–7.0)
Eosinophils Absolute: 0 10*3/uL (ref 0.0–0.5)
HCT: 31.5 % — ABNORMAL LOW (ref 38.4–49.9)
HGB: 10.9 g/dL — ABNORMAL LOW (ref 13.0–17.1)
MCH: 30.9 pg (ref 27.2–33.4)
MCV: 89.3 fL (ref 79.3–98.0)
MONO%: 5.9 % (ref 0.0–14.0)
NEUT#: 1.4 10*3/uL — ABNORMAL LOW (ref 1.5–6.5)
NEUT%: 71.4 % (ref 39.0–75.0)
Platelets: 46 10*3/uL — ABNORMAL LOW (ref 140–400)
RDW: 16.5 % — ABNORMAL HIGH (ref 11.0–14.6)

## 2009-09-13 ENCOUNTER — Telehealth: Payer: Self-pay | Admitting: Internal Medicine

## 2009-09-14 ENCOUNTER — Telehealth (INDEPENDENT_AMBULATORY_CARE_PROVIDER_SITE_OTHER): Payer: Self-pay | Admitting: *Deleted

## 2009-10-13 ENCOUNTER — Ambulatory Visit: Payer: Self-pay | Admitting: Internal Medicine

## 2009-10-14 LAB — CONVERTED CEMR LAB
Albumin: 3.9 g/dL (ref 3.5–5.2)
BUN: 26 mg/dL — ABNORMAL HIGH (ref 6–23)
Creatinine, Ser: 1.5 mg/dL (ref 0.4–1.5)
TSH: 5.37 microintl units/mL (ref 0.35–5.50)
Total Bilirubin: 0.4 mg/dL (ref 0.3–1.2)
Uric Acid, Serum: 8.2 mg/dL — ABNORMAL HIGH (ref 4.0–7.8)

## 2009-10-17 ENCOUNTER — Telehealth (INDEPENDENT_AMBULATORY_CARE_PROVIDER_SITE_OTHER): Payer: Self-pay | Admitting: *Deleted

## 2009-10-18 ENCOUNTER — Encounter: Payer: Self-pay | Admitting: Internal Medicine

## 2009-10-27 ENCOUNTER — Ambulatory Visit: Payer: Self-pay | Admitting: Internal Medicine

## 2009-11-15 ENCOUNTER — Ambulatory Visit: Payer: Self-pay | Admitting: Oncology

## 2009-11-17 ENCOUNTER — Encounter: Payer: Self-pay | Admitting: Internal Medicine

## 2009-11-17 LAB — CBC WITH DIFFERENTIAL/PLATELET
Basophils Absolute: 0 10*3/uL (ref 0.0–0.1)
Eosinophils Absolute: 0 10*3/uL (ref 0.0–0.5)
HCT: 33 % — ABNORMAL LOW (ref 38.4–49.9)
HGB: 11.5 g/dL — ABNORMAL LOW (ref 13.0–17.1)
LYMPH%: 22.3 % (ref 14.0–49.0)
MCV: 89.5 fL (ref 79.3–98.0)
MONO%: 5.2 % (ref 0.0–14.0)
NEUT#: 1.7 10*3/uL (ref 1.5–6.5)
NEUT%: 70.5 % (ref 39.0–75.0)
Platelets: 56 10*3/uL — ABNORMAL LOW (ref 140–400)
RDW: 17.8 % — ABNORMAL HIGH (ref 11.0–14.6)

## 2009-11-17 LAB — COMPREHENSIVE METABOLIC PANEL
CO2: 24 mEq/L (ref 19–32)
Glucose, Bld: 117 mg/dL — ABNORMAL HIGH (ref 70–99)
Sodium: 139 mEq/L (ref 135–145)
Total Bilirubin: 0.5 mg/dL (ref 0.3–1.2)
Total Protein: 6.5 g/dL (ref 6.0–8.3)

## 2009-11-17 LAB — LACTATE DEHYDROGENASE: LDH: 125 U/L (ref 94–250)

## 2009-11-26 ENCOUNTER — Observation Stay (HOSPITAL_COMMUNITY)
Admission: EM | Admit: 2009-11-26 | Discharge: 2009-11-28 | Payer: Self-pay | Source: Home / Self Care | Admitting: Emergency Medicine

## 2009-11-28 ENCOUNTER — Encounter (INDEPENDENT_AMBULATORY_CARE_PROVIDER_SITE_OTHER): Payer: Self-pay | Admitting: Internal Medicine

## 2009-11-28 ENCOUNTER — Ambulatory Visit: Payer: Self-pay | Admitting: Surgery

## 2009-12-06 ENCOUNTER — Ambulatory Visit: Payer: Self-pay | Admitting: Internal Medicine

## 2009-12-06 DIAGNOSIS — L02419 Cutaneous abscess of limb, unspecified: Secondary | ICD-10-CM

## 2009-12-06 DIAGNOSIS — D61818 Other pancytopenia: Secondary | ICD-10-CM

## 2009-12-06 DIAGNOSIS — L03119 Cellulitis of unspecified part of limb: Secondary | ICD-10-CM

## 2009-12-07 LAB — CONVERTED CEMR LAB
Basophils Relative: 0.3 % (ref 0.0–3.0)
Eosinophils Relative: 1.4 % (ref 0.0–5.0)
HCT: 34.9 % — ABNORMAL LOW (ref 39.0–52.0)
Hemoglobin: 11.9 g/dL — ABNORMAL LOW (ref 13.0–17.0)
Hgb A1c MFr Bld: 5.7 % (ref 4.6–6.5)
Lymphs Abs: 0.7 10*3/uL (ref 0.7–4.0)
MCV: 91.3 fL (ref 78.0–100.0)
Monocytes Relative: 5.5 % (ref 3.0–12.0)
Neutro Abs: 3.4 10*3/uL (ref 1.4–7.7)
RBC: 3.82 M/uL — ABNORMAL LOW (ref 4.22–5.81)
WBC: 4.5 10*3/uL (ref 4.5–10.5)

## 2009-12-22 ENCOUNTER — Ambulatory Visit: Payer: Self-pay | Admitting: Internal Medicine

## 2009-12-27 ENCOUNTER — Ambulatory Visit: Payer: Self-pay | Admitting: Cardiology

## 2009-12-27 ENCOUNTER — Telehealth: Payer: Self-pay | Admitting: Cardiology

## 2010-02-09 NOTE — Progress Notes (Signed)
Summary: pt rtn call to someone  Phone Note Call from Patient Call back at Home Phone [phone]   Caller: Patient Reason for Call: Talk to Nurse, Talk to Doctor Summary of Call: pt rtrn call to someone Initial call taken by: Omer Jack,  June 30, 2009 12:37 PM  Follow-up for Phone Call        Phone Call Completed PT AWARE OF ECHO RESULST. Follow-up by: Scherrie Bateman, LPN,  June 30, 2009 1:10 PM

## 2010-02-09 NOTE — Progress Notes (Signed)
Summary: pt rtn call  Phone Note Call from Patient Call back at Home Phone [phone]   Caller: Patient Reason for Call: Talk to Nurse, Talk to Doctor Summary of Call: pt rtn call Initial call taken by: Omer Jack,  December 27, 2009 11:20 AM  Follow-up for Phone Call        I talked with Mr. Brar this morning.  He was out mowing the yard and forgot he had an appt with Dr. Daleen Squibb.  He is not having cardiac complaints at this time and will reschedule his appt. with Dr. Daleen Squibb. Mylo Red RN Follow-up by: Lisabeth Devoid RN,  December 27, 2009 12:13 PM     Appended Document: pt rtn call  Reviewed Juanito Doom, MD

## 2010-02-09 NOTE — Assessment & Plan Note (Signed)
Summary: hosp f/u cellulitis/cbs   Vital Signs:  Patient profile:   77 year old male Weight:      191.4 pounds BMI:     27.56 Temp:     97.8 degrees F oral Pulse rate:   64 / minute Resp:     17 per minute BP sitting:   122 / 76  (left arm) Cuff size:   large  Vitals Entered By: Shonna Chock CMA (December 06, 2009 11:17 AM) CC: Hospital follow-up   Primary Care Illyria Sobocinski:  Dr. Alwyn Ren  CC:  Hospital follow-up.  History of Present Illness: Discharge Summary reviewed: LLE cellulitis , acute onset 11/26/2009 as  rigor , chils & fever. Avelox  & Septra DS completed as of today. At discharge WBC was 1.8, Hgb 8.7 & platelet # was 43,000. Heme values "were up " earlier this  month @ Dr Sherrill's (WBC 2.4, Hgb 11.5, plat # 56,000 on 11/17/2009. FBS since D/C 135-164. A1c was 5.9% in 07/2009.  Current Medications (verified): 1)  Omeprazole 20 Mg  Cpdr (Omeprazole) .[name] Kitchen.. 1 By Mouth Qd 2)  Levothyroxine Sodium 50 Mcg  (Levothyroxine Sodium) .[name] Kitchen.. 1 Once Daily Except 1&1/2 M,w,f & Sun 3)  Furosemide 20 Mg  Tabs (Furosemide) .[name] Kitchen.. 1 By Mouth Three Times A Day 4)  Pacerone 200 Mg Tabs (Amiodarone Hcl) .... One Tablet Once Daily. 5)  Primidone 50 Mg  Tabs (Primidone) .... 2 Tab Two Times A Day 6)  Flax Seed Oil 1000 Mg  Caps (Flaxseed (Linseed)) .[name] Kitchen.. 1 By Mouth Once Daily 7)  Multivitamins   Tabs (Multiple Vitamin) .[name] Kitchen.. 1 Tab Once Daily 8)  Bayer Aspirin 325 Mg  Tabs (Aspirin) .[name] Kitchen.. 1 By Mouth Once Daily 9)  Klor-Con 20 Meq  Pack (Potassium Chloride) .[name] Kitchen.. 1 By Mouth Qd 10)  Fish Oil 1000 Mg Caps (Omega-3 Fatty Acids) .[name] Kitchen.. 1 Cap Once Daily 11)  Vitamin C 500 Mg Tabs (Ascorbic Acid) .[name] Kitchen.. 1 Tab Once Daily 12)  Losartan Potassium 100 Mg Tabs (Losartan Potassium) .[name] Kitchen.. 1 Once Daily 13)  Cinnamon 500 Mg Tabs (Cinnamon) .... 2 Caps Once Daily 14)  Vitamin D 1000 Unit Tabs (Cholecalciferol) .[name] Kitchen.. 1 Tab Once Daily 15)  Propranolol Hcl 20 Mg Tabs (Propranolol Hcl) .... Take 1 Tab Once Daily Two Times A Day  For Tremors 16)  Colcrys 0.6 Mg Tabs (Colchicine) .... Take 1 Tab  Two Times A Day 17)  Allopurinol 100 Mg Tabs (Allopurinol) .[name] Kitchen.. 1 Once Daily  Allergies: 1)  ! Pcn 2)  ! Neosporin  Past History:  Past Medical History: COUMADIN THERAPY (ICD-V58.61) CVA (ICD-434.91) ATRIAL FIBRILLATION (ICD-427.31) CAD, NATIVE VESSEL (ICD-414.01) BRADYCARDIA (ICD-427.89) EDEMA (ICD-782.3) AORTIC STENOSIS, MILD (ICD-424.1) HYPERTENSION (ICD-401.9) DIABETES MELLITUS, CONTROLLED (ICD-250.00) ABRASION, ARM (ICD-919.0) JOINT EFFUSION, LEFT KNEE (ICD-719.06) LEUKOPENIA, CHRONIC (ICD-288.50) MULTIPLE&UNSPEC OPEN WOUND LOWER LIMB COMP (ICD-894.1) UNSPECIFIED ANEMIA (ICD-285.9) PAIN IN SOFT TISSUES OF LIMB (ICD-729.5) CELLULITIS AND ABSCESS OF UNSPECIFIED SITE (ICD-682.9) ACTION TREMOR (ICD-333.1) POPLITEAL CYST, RIGHT (ICD-727.51) Family Hx of ADENOCARCINOMA, COLON (ICD-153.9) Family Hx of HX, FAMILY, STROKE (ICD-V17.1) THROMBOCYTOPENIA (ICD-287.5) THROAT CANCER (ICD-149.9) DEGENERATIVE DISC DISEASE (ICD-722.6) POLYP, COLON (ICD-211.3) ANXIETY (ICD-300.00) SPLENOMEGALY (ICD-789.2) GOUT (ICD-274.9) GERD (ICD-530.81) ABNORMAL THYROID FUNCTION TESTS (ICD-794.5) 11/2009 : Cellulitis , PCP     Physical Exam  General:  in no acute distress; alert,appropriate and cooperative throughout examination Mouth:  Herpetic lesion L lower lip Pulses:  R and L dorsalis pedis and posterior tibial pulses are full and equal bilaterally Extremities:  1+ left pedal edema and  trace right pedal edema.   Neg Homan's LLE Skin:  Stasis hyperpigmentation L shin  > R Cervical Nodes:  No lymphadenopathy noted Axillary Nodes:  No palpable lymphadenopathy   Impression & Recommendations:  Problem # 1:  CELLULITIS, LEG, LEFT (ICD-682.6)  clinically resolved  Orders: Venipuncture (30865) TLB-CBC Platelet - w/Differential (85025-CBCD)  Problem # 2:  OTHER PANCYTOPENIA (ICD-284.19)  Orders: Venipuncture  (78469) TLB-CBC Platelet - w/Differential (85025-CBCD)  Problem # 3:  HYPERGLYCEMIA, FASTING (ICD-790.29)  Orders: TLB-A1C / Hgb A1C (Glycohemoglobin) (83036-A1C)  Complete Medication List: 1)  Omeprazole 20 Mg Cpdr (Omeprazole) .[name] Kitchen.. 1 by mouth qd 2)  Levothyroxine Sodium 50 Mcg (levothyroxine Sodium)  .[name] Kitchen.. 1 once daily except 1&1/2 m,w,f & sun 3)  Furosemide 20 Mg Tabs (Furosemide) .[name] Kitchen.. 1 by mouth three times a day 4)  Pacerone 200 Mg Tabs (Amiodarone hcl) .... One tablet once daily. 5)  Primidone 50 Mg Tabs (Primidone) .... 2 tab two times a day 6)  Flax Seed Oil 1000 Mg Caps (Flaxseed (linseed)) .[name] Kitchen.. 1 by mouth once daily 7)  Multivitamins Tabs (Multiple vitamin) .[name] Kitchen.. 1 tab once daily 8)  Bayer Aspirin 325 Mg Tabs (Aspirin) .[name] Kitchen.. 1 by mouth once daily 9)  Klor-con 20 Meq Pack (Potassium chloride) .[name] Kitchen.. 1 by mouth qd 10)  Fish Oil 1000 Mg Caps (Omega-3 fatty acids) .[name] Kitchen.. 1 cap once daily 11)  Vitamin C 500 Mg Tabs (Ascorbic acid) .[name] Kitchen.. 1 tab once daily 12)  Losartan Potassium 100 Mg Tabs (Losartan potassium) .[name] Kitchen.. 1 once daily 13)  Cinnamon 500 Mg Tabs (Cinnamon) .... 2 caps once daily 14)  Vitamin D 1000 Unit Tabs (Cholecalciferol) .[name] Kitchen.. 1 tab once daily 15)  Propranolol Hcl 20 Mg Tabs (Propranolol hcl) .... Take 1 tab once daily two times a day for tremors 16)  Colcrys 0.6 Mg Tabs (Colchicine) .... Take 1 tab  two times a day 17)  Allopurinol 100 Mg Tabs (Allopurinol) .[name] Kitchen.. 1 once daily  Patient Instructions: 1)  Check your blood sugars regularly. If your readings are usually  AVERAGING above :150  or below 90 you should contact our office. 2)  It is important that your Diabetic A1c level is checked every 3 months.   Orders Added: 1)  Est. Patient Level III [62952] 2)  Venipuncture [84132] 3)  TLB-CBC Platelet - w/Differential [85025-CBCD] 4)  TLB-A1C / Hgb A1C (Glycohemoglobin) [83036-A1C]

## 2010-02-09 NOTE — Progress Notes (Signed)
Summary: Medication Concerns   Phone Note Refill Request   Refills Requested: Medication #1:  COLCRYS 0.6 MG TABS Take 1 tab  two times a day.  pt called back stating that when he went to pick up med it was $300.00 for a month supply. pt states that he is unable to afford this med. Spoke with pharmacy there are no other cheaper med beside allopurinol that the pt can take. pls advise on alternative med............[name] KitchenFelecia Deloach CMA  September 14, 2009 10:09 AM    Follow-up for Phone Call        will they honor Colcrys discount card? Follow-up by: [name] MD,  September 14, 2009 4:15 PM  Additional Follow-up for Phone Call Additional follow up Details #1::        pt does not have rx insurance pt get med filled at [place] so pt is unable to use coupon.........[name] KitchenFelecia Deloach CMA  September 14, 2009 4:42 PM     Additional Follow-up for Phone Call Additional follow up Details #2::    The critical issue is to get the uric level less than 7; a level  less than 5 or 6 is usually needed  to prevent gout. The Colcrys is needed to treat acute gout. Once gout is controlled , low dose Allopurinol could be started to reduce uric acid ,but this agent is not as safe for kidney function  as Uloric. I have asked Drug Rep is there is a way to get Colcrys cheaper. Also you should check to see if Colcrys or Uloric are available @ the [place]. This issue of drug costs is terribly frustrating ,and  it is becoming more common every month. Hopp Follow-up by: [name] MD,  September 15, 2009 7:58 PM  Additional Follow-up for Phone Call Additional follow up Details #3:: Details for Additional Follow-up Action Taken: I left message on VM for Debbie, Colcrys represenative to see if she had any recommendations on how we could better help the patient in this situation (Per Dr/Hopper's request).Shonna Chock CMA  September 16, 2009 4:59 PM    I spoke with the rep yesterday and was informed to call patient and give  him the patient assistance program number for Colcrys [phone], I called patient x 2-line busy, I will try to reach patient later./Chrae [facility] CMA  September 20, 2009 8:28 AM   I spoke with patient and he said he will go to the [place] on 09/28/2009 and will see if they can help him with getting rx filled. Patient aware I will still mail him the assistance form .Shonna Chock CMA  September 22, 2009 2:10 PM

## 2010-02-09 NOTE — Progress Notes (Signed)
Summary: med too expensive   Phone Note Refill Request Call back at Home Phone [phone] Message from:  Patient  Refills Requested: Medication #1:  ULORIC 40 MG TABS 1 once daily. Pt states that med is too expensive at  $188.36 for 30 day supply. Pt would like to know if there is another med that he can try that is cheaper. Pt uses [facility]. pls advise............[name] KitchenFelecia Deloach CMA  September 13, 2009 9:22 AM    Follow-up for Phone Call        Colcrys 0.6 mg two times a day #60. BUN,creat ,K+ , uric acid in 4 weeks.(790.6, 274.9) Follow-up by: [name] MD,  September 13, 2009 1:20 PM  Additional Follow-up for Phone Call Additional follow up Details #1::        pt aware, rx sent to pharmacy, appt schedule............[name] KitchenFelecia Deloach CMA  September 13, 2009 2:29 PM     New/Updated Medications: COLCRYS 0.6 MG TABS (COLCHICINE) Take 1 tab  two times a day Prescriptions: COLCRYS 0.6 MG TABS (COLCHICINE) Take 1 tab  two times a day  #60 x 0   Entered by:   Jeremy Johann CMA   Authorized by:   [name] MD   Signed by:   Jeremy Johann CMA on 09/13/2009   Method used:   F[facility]ed to ...       [facility]       [address]       [place], [place]  [address]       Ph: [phone]       Fax: [phone]   RxID:   [id number]

## 2010-02-09 NOTE — Progress Notes (Signed)
Summary: Problems with Medication assistance forms  Phone Note Other Incoming   Caller: Sam from [facility] pt assistance program Details for Reason: Issues with RX Summary of Call: Msg from Sam stating that they rcv'd an Rx assistance request form for this pt that was not properly filled out. He stated that the quantity and rf amount was not complete and he faxed over another form to be completed. If any questions or concerns c/b [phone].  Initial call taken by: Almeta Monas CMA Duncan Dull),  October 17, 2009 12:25 PM  Follow-up for Phone Call        Form was completed and faxed back then to be scanned Follow-up by: Shonna Chock CMA,  October 19, 2009 10:00 AM

## 2010-02-09 NOTE — Assessment & Plan Note (Signed)
Summary: fup on labs//ccm   Vital Signs:  Patient profile:   77 year old male Weight:      195 pounds Pulse rate:   56 / minute Resp:     15 per minute BP sitting:   100 / 62  (left arm) Cuff size:   large  Vitals Entered By: Shonna Chock (March 09, 2009 11:25 AM) CC: Follow-up visit: Discuss Labs (copy given) Comments REVIEWED MED LIST, PATIENT AGREED DOSE AND INSTRUCTION CORRECT    Primary Care Provider:  Dr. Alwyn Ren  CC:  Follow-up visit: Discuss Labs (copy given).  History of Present Illness: [name] is asymptomatic ; he goes to [place] every 6 months. Labs reviewed & risks discussed. Despite > 40 oz of fluids / day , BUN 34. No gout , but uric acid 8.5. Off all DM meds his A1c 5.8%. FBS 150- 179. Weight stable; no specific diet. No hypoglycemia.TSH 6.84 ; he forgets to take extra 1/2 of thyroid 50 micrograms  on Weds.  Allergies: 1)  ! Pcn 2)  ! Neosporin  Review of Systems General:  Denies fatigue and weight loss. Eyes:  Denies blurring, double vision, and vision loss-both eyes. ENT:  Denies difficulty swallowing and hoarseness. CV:  Denies chest pain or discomfort, leg cramps with exertion, lightheadness, near fainting, and palpitations. 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 Neuro:  Denies numbness, poor balance, and tingling; Burning in feet. Endo:  Complains of heat intolerance; denies cold intolerance, excessive hunger, excessive thirst, and excessive urination; "Hot flashes" intermittently.  Physical Exam  General:  Appears younger than age,in no acute distress; alert,appropriate and cooperative throughout examination Neck:  No deformities, masses, or tenderness noted. Lungs:  Normal respiratory effort, chest expands symmetrically. Lungs are clear to auscultation, no crackles or wheezes. Heart:  regular rhythm, no gallop, no rub, no JVD, bradycardia, and grade1 1/2   /6 systolic murmur R base.     Pulses:  R and L carotid,radia,dorsalis pedis and posterior tibial pulses are full and equal bilaterally Extremities:  1+ left pedal edema and 1+ right pedal edema.   Neurologic:  alert & oriented X3 and DTRs symmetrical  but 0-1/2+ @ knees Skin:  Hyperpigmented forearms from ecchymoses Cervical Nodes:  No lymphadenopathy noted Axillary Nodes:  No palpable lymphadenopathy Psych:  memory intact for recent and remote, normally interactive, and good eye contact.     Impression & Recommendations:  Problem # 1:  HYPERGLYCEMIA, FASTING (ICD-790.29) Elevated FBS with normal A1c; R/O machine dysfunction His updated medication list for this problem includes:    Glipizide 5 Mg Xr24h-tab (Glipizide) .[name] Kitchen... 1/2 tab two times a day**med d/c**  Problem # 2:  THYROID FUNCTION TEST, ABNORMAL (ICD-794.5) TSH 6.84;non adherence to increased dose on Weds  Problem # 3:  GOUT (ICD-274.9) Inactive but uric acid elevated His updated medication list for this problem includes:    Allopurinol 100 Mg Tabs (Allopurinol) .[name] Kitchen... 2 by mouth once daily **med d/c**  Complete Medication List: 1)  Omeprazole 20 Mg Cpdr (Omeprazole) .[name] Kitchen.. 1 by mouth qd 2)  Levothyroxine Sodium 500 Mcg Solr (Levothyroxine sodium) .... 0.05mg  1 by mouth once daily except 1& 1/2 on weds 3)  Furosemide 20 Mg Tabs (Furosemide) .[name] Kitchen.. 1 by mouth three times a day 4)  Pacerone 200 Mg Tabs (Amiodarone hcl) .... One tablet once daily. 5)  Allopurinol 100 Mg Tabs (Allopurinol) .... 2 by mouth once daily **med d/c**  6)  Primidone 50 Mg Tabs (Primidone) .... 2 tab two times a day 7)  Flax Seed Oil 1000 Mg Caps (Flaxseed (linseed)) .[name] Kitchen.. 1 by mouth once daily 8)  Multivitamins Tabs (Multiple vitamin) .[name] Kitchen.. 1 tab once daily 9)  Bayer Aspirin 325 Mg Tabs (Aspirin) .[name] Kitchen.. 1 by mouth once daily 10)  Klor-con 20 Meq Pack (Potassium chloride) .[name] Kitchen.. 1 by mouth qd 11)  Fish Oil 1000 Mg Caps (Omega-3 fatty acids) .[name] Kitchen.. 1 cap once daily 12)  Vitamin C 500 Mg Tabs  (Ascorbic acid) .[name] Kitchen.. 1 tab once daily 13)  Glipizide 5 Mg Xr24h-tab (Glipizide) .... 1/2 tab two times a day**med d/c** 14)  Losartan Potassium 100 Mg Tabs (Losartan potassium) .[name] Kitchen.. 1 once daily 15)  Cinnamon 500 Mg Tabs (Cinnamon) .... 2 caps once daily 16)  Vitamin D 1000 Unit Tabs (Cholecalciferol) .[name] Kitchen.. 1 tab once daily 17)  Propranolol Hcl 20 Mg Tabs (Propranolol hcl) .... Take 1 tab once daily two times a day for tremors  Patient Instructions: 1)  Consume LESS THAN 40 grams of sugar / day from foods & drinks with High Fructose Corn Syrup as #1, 2 or # 3 on label to prevent DM & gout (HFCS raises uric acid) 2)  Please schedule a follow-up appointment in 4 months. 3)  HbgA1C;uric acid; TSH. Take Glucometer to all appts to verify reliaability. Use med box to guarantee correct thyroid dose.

## 2010-02-09 NOTE — Assessment & Plan Note (Signed)
Summary: 6 MONTHS F/U /CY   Visit Type:  6 mo f/u Primary Provider:  Dr. Alwyn Ren  CC:  edema/ankles/legs///no other complaints today.  History of Present Illness: [name] comes in today for followup of his complex cardiac and valvular history.  He's been having some dyspnea on exertion but continues to work initially. He denies any chest discomfort per se. He's had no syncope.  He remains on Coumadin and has had no further falls. He  He denies any orthopnea, PND or significant edema except at the end of the day. He's had no claudication.  Current Medications (verified): 1)  Omeprazole 20 Mg  Cpdr (Omeprazole) .[name] Kitchen.. 1 By Mouth Qd 2)  Levothyroxine Sodium 500 Mcg Solr (Levothyroxine Sodium) .... 0.05mg  1 By Mouth Once Daily Except 1& 1/2 On Weds 3)  Furosemide 20 Mg  Tabs (Furosemide) .[name] Kitchen.. 1 By Mouth Three Times A Day 4)  Pacerone 200 Mg Tabs (Amiodarone Hcl) .... One Tablet Once Daily. 5)  Allopurinol 100 Mg Tabs (Allopurinol) .... 2 By Mouth Once Daily **med D/c** 6)  Primidone 50 Mg  Tabs (Primidone) .... 2 Tab Two Times A Day 7)  Flax Seed Oil 1000 Mg  Caps (Flaxseed (Linseed)) .[name] Kitchen.. 1 By Mouth Once Daily 8)  Multivitamins   Tabs (Multiple Vitamin) .[name] Kitchen.. 1 Tab Once Daily 9)  Bayer Aspirin 325 Mg  Tabs (Aspirin) .[name] Kitchen.. 1 By Mouth Once Daily 10)  Klor-Con 20 Meq  Pack (Potassium Chloride) .[name] Kitchen.. 1 By Mouth Qd 11)  Fish Oil 1000 Mg Caps (Omega-3 Fatty Acids) .[name] Kitchen.. 1 Cap Once Daily 12)  Vitamin C 500 Mg Tabs (Ascorbic Acid) .[name] Kitchen.. 1 Tab Once Daily 13)  Glipizide 5 Mg Xr24h-Tab (Glipizide) .... 1/2 Tab Two Times A Day**med D/c** 14)  Losartan Potassium 100 Mg Tabs (Losartan Potassium) .[name] Kitchen.. 1 Once Daily 15)  Cinnamon 500 Mg Tabs (Cinnamon) .... 2 Caps Once Daily 16)  Vitamin D 1000 Unit Tabs (Cholecalciferol) .[name] Kitchen.. 1 Tab Once Daily 17)  Propranolol Hcl 20 Mg Tabs (Propranolol Hcl) .... Take 1 Tab Once Daily Two Times A Day For Tremors  Allergies: 1)  ! Pcn 2)  ! Neosporin  Past  History:  Past Medical History: Last updated: 12/21/2008 COUMADIN THERAPY (ICD-V58.61) CVA (ICD-434.91) ATRIAL FIBRILLATION (ICD-427.31) CAD, NATIVE VESSEL (ICD-414.01) BRADYCARDIA (ICD-427.89) EDEMA (ICD-782.3) AORTIC STENOSIS, MILD (ICD-424.1) HYPERTENSION (ICD-401.9) DIABETES MELLITUS, CONTROLLED (ICD-250.00) ABRASION, ARM (ICD-919.0) JOINT EFFUSION, LEFT KNEE (ICD-719.06) LEUKOPENIA, CHRONIC (ICD-288.50) MULTIPLE&UNSPEC OPEN WOUND LOWER LIMB COMP (ICD-894.1) UNSPECIFIED ANEMIA (ICD-285.9) PAIN IN SOFT TISSUES OF LIMB (ICD-729.5) CELLULITIS AND ABSCESS OF UNSPECIFIED SITE (ICD-682.9) ACTION TREMOR (ICD-333.1) POPLITEAL CYST, RIGHT (ICD-727.51) Family Hx of ADENOCARCINOMA, COLON (ICD-153.9) Family Hx of HX, FAMILY, STROKE (ICD-V17.1) THROMBOCYTOPENIA (ICD-287.5) THROAT CANCER (ICD-149.9) DEGENERATIVE DISC DISEASE (ICD-722.6) POLYP, COLON (ICD-211.3) ANXIETY (ICD-300.00) SPLENOMEGALY (ICD-789.2) GOUT (ICD-274.9) GERD (ICD-530.81) ABNORMAL THYROID FUNCTION TESTS (ICD-794.5)      Past Surgical History: Last updated: 06/12/2007 Cholecystectomy  Family History: Last updated: 06/12/2007 [place]  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  Alcohol Use - no  Risk Factors: Smoking Status: never (05/15/2008)  Review of Systems       negative other than history of present illness  Vital Signs:  Patient profile:   77 year old male Height:      70 inches Weight:      190 pounds BMI:     27.36 Pulse rate:   51 / minute  Pulse rhythm:   irregular BP sitting:   118 / 60  (left arm) Cuff size:   large  Vitals Entered By: Danielle Rankin, CMA (June 09, 2009 10:26 AM)  Physical Exam  General:  Well developed, well nourished, in no acute distress. Head:  normocephalic and atraumatic Eyes:  PERRLA/EOM intact; conjunctiva and lids normal. Neck:  Neck supple, no JVD. No  masses, thyromegaly or abnormal cervical nodes. Chest Navya Timmons:  no deformities or breast masses noted Lungs:  decreased breath sounds throughout Heart:  PMI nondisplaced, normal S1-S2 difficult to hear split. Aortic stenosis murmur. No gallop Msk:  decreased ROM.   Pulses:  pulses normal in all 4 extremities Extremities:  bruised,1+ left pedal edema and 1+ right pedal edema.   Neurologic:  Alert and oriented x 3. Skin:  Intact without lesions or rashes. Psych:  Normal affect.   EKG  Procedure date:  06/09/2009  Findings:      sinus bradycardia, left axis deviation  Impression & Recommendations:  Problem # 1:  ATRIAL FIBRILLATION (ICD-427.31) Assessment Unchanged Will check T4 and LFTs His updated medication list for this problem includes:    Pacerone 200 Mg Tabs (Amiodarone hcl) ..... One tablet once daily.    Bayer Aspirin 325 Mg Tabs (Aspirin) .[name] Kitchen... 1 by mouth once daily    Propranolol Hcl 20 Mg Tabs (Propranolol hcl) .[name] Kitchen... Take 1 tab once daily two times a day for tremors  Problem # 2:  COUMADIN THERAPY (ICD-V58.61) Assessment: Unchanged  Problem # 3:  CAD, NATIVE VESSEL (ICD-414.01) Assessment: Unchanged  His updated medication list for this problem includes:    Bayer Aspirin 325 Mg Tabs (Aspirin) .[name] Kitchen... 1 by mouth once daily    Propranolol Hcl 20 Mg Tabs (Propranolol hcl) .[name] Kitchen... Take 1 tab once daily two times a day for tremors  Problem # 4:  BRADYCARDIA (ICD-427.89) Assessment: Unchanged  His updated medication list for this problem includes:    Pacerone 200 Mg Tabs (Amiodarone hcl) ..... One tablet once daily.    Bayer Aspirin 325 Mg Tabs (Aspirin) .[name] Kitchen... 1 by mouth once daily    Propranolol Hcl 20 Mg Tabs (Propranolol hcl) .[name] Kitchen... Take 1 tab once daily two times a day for tremors  Orders: EKG w/ Interpretation (93000)  Problem # 5:  AORTIC STENOSIS, MILD (ICD-424.1)  I am having a difficult time hearing is A2 split. But his dyspnea chest discomfort on occasions,  will repeat his 2-D echocardiogram to evaluate severity of his aortic stenosis. His updated medication list for this problem includes:    Furosemide 20 Mg Tabs (Furosemide) .[name] Kitchen... 1 by mouth three times a day    Losartan Potassium 100 Mg Tabs (Losartan potassium) .[name] Kitchen... 1 once daily    Propranolol Hcl 20 Mg Tabs (Propranolol hcl) .[name] Kitchen... Take 1 tab once daily two times a day for tremors  Orders: Echocardiogram (Echo)  Patient Instructions: 1)  Your physician recommends that you schedule a follow-up appointment in: 6  MONTHS WITH DR Michaelene Dutan 2)  Your physician recommends that you return for lab work AO:[id number] T4 LIVER AT DR  HOPPERS OFF IN JULY V58.69  3)  Your physician recommends that you continue on your current medications as directed. Please refer to the Current Medication list given to you today. 4)  Your physician has requested that you have an echocardiogram.  Echocardiography is a painless test that uses sound waves to create images of your heart. It provides your doctor with information about the size and shape  of your heart and how well your heart's chambers and valves are working.  This procedure takes approximately one hour. There are no restrictions for this procedure.

## 2010-02-09 NOTE — Assessment & Plan Note (Signed)
Summary: FOLLOWUP FROM LAST LABS///[facility]   Vital Signs:  Patient profile:   77 year old male Weight:      197.6 pounds BMI:     28.46 Pulse rate:   56 / minute Resp:     16 per minute BP sitting:   132 / 80  (left arm) Cuff size:   large  Vitals Entered By: Shonna Chock CMA (October 27, 2009 9:28 AM) CC: Follow-up visit: discuss labs (patient with mailed copy)   Primary Care Provider:  Dr. Alwyn Ren  CC:  Follow-up visit: discuss labs (patient with mailed copy).  History of Present Illness:  Labs reviewed & risks discussed. Uric acid improved but not @ goal . Colcrys & Uloric were very expensive; he did  stay on it. He is off Allopurinol due to renal impairment . Last gout attack was  ? 1 year ago.    BP has been elevated but is decreasing.Records were reviewed.The patient reports urinary frequency, but denies lightheadedness, headaches, and fatigue.  Associated symptoms include leg edema and pedal edema.  The patient denies the following associated symptoms: chest pain, chest pressure, dyspnea, palpitations, and syncope.  Adjunctive measures currently used by the patient include salt restriction.    Current Medications (verified): 1)  Omeprazole 20 Mg  Cpdr (Omeprazole) .[name] Kitchen.. 1 By Mouth Qd 2)  Levothyroxine Sodium 50 Mcg  (Levothyroxine Sodium) .... 0.05mg  1 By Mouth Once Daily Except 1& 1/2 On  M, W & F 3)  Furosemide 20 Mg  Tabs (Furosemide) .[name] Kitchen.. 1 By Mouth Three Times A Day 4)  Pacerone 200 Mg Tabs (Amiodarone Hcl) .... One Tablet Once Daily. 5)  Primidone 50 Mg  Tabs (Primidone) .... 2 Tab Two Times A Day 6)  Flax Seed Oil 1000 Mg  Caps (Flaxseed (Linseed)) .[name] Kitchen.. 1 By Mouth Once Daily 7)  Multivitamins   Tabs (Multiple Vitamin) .[name] Kitchen.. 1 Tab Once Daily 8)  Bayer Aspirin 325 Mg  Tabs (Aspirin) .[name] Kitchen.. 1 By Mouth Once Daily 9)  Klor-Con 20 Meq  Pack (Potassium Chloride) .[name] Kitchen.. 1 By Mouth Qd 10)  Fish Oil 1000 Mg Caps (Omega-3 Fatty Acids) .[name] Kitchen.. 1 Cap Once Daily 11)  Vitamin C 500 Mg Tabs  (Ascorbic Acid) .[name] Kitchen.. 1 Tab Once Daily 12)  Losartan Potassium 100 Mg Tabs (Losartan Potassium) .[name] Kitchen.. 1 Once Daily 13)  Cinnamon 500 Mg Tabs (Cinnamon) .... 2 Caps Once Daily 14)  Vitamin D 1000 Unit Tabs (Cholecalciferol) .[name] Kitchen.. 1 Tab Once Daily 15)  Propranolol Hcl 20 Mg Tabs (Propranolol Hcl) .... Take 1 Tab Once Daily Two Times A Day For Tremors 16)  Colcrys 0.6 Mg Tabs (Colchicine) .... Take 1 Tab  Two Times A Day  Allergies: 1)  ! Pcn 2)  ! Neosporin  Physical Exam  General:  well-nourished;alert,appropriate and cooperative throughout examination Lungs:  Normal respiratory effort, chest expands symmetrically. Lungs are clear to auscultation, no crackles or wheezes. Heart:  normal rate, regular rhythm, no gallop, no rub, no JVD, and grade 1.5-2  /6 systolic murmur R base .   Pulses:  R and L carotid,radial,dorsalis pedis and posterior tibial pulses are full and equal bilaterally Extremities:  1+ left pedal edema and 1+ right pedal edema.   Neurologic:  alert & oriented X3.     Impression & Recommendations:  Problem # 1:  HYPERURICEMIA, ASYMPTOMATIC (ICD-790.6)  Problem # 2:  HYPERTENSION (ICD-401.9)  His updated medication list for this problem includes:    Furosemide 20 Mg Tabs (Furosemide) .[name] Kitchen... 1 by  mouth three times a day    Losartan Potassium 100 Mg Tabs (Losartan potassium) .[name] Kitchen... 1 once daily    Propranolol Hcl 20 Mg Tabs (Propranolol hcl) .[name] Kitchen... Take 1 tab once daily two times a day for tremors  Problem # 3:  HYPERGLYCEMIA, FASTING (ICD-790.29)  The following medications were removed from the medication list:    Glipizide 5 Mg Xr24h-tab (Glipizide) .[name] Kitchen... 1/2 tab two times a day**med d/c**  Complete Medication List: 1)  Omeprazole 20 Mg Cpdr (Omeprazole) .[name] Kitchen.. 1 by mouth qd 2)  Levothyroxine Sodium 50 Mcg (levothyroxine Sodium)  .[name] Kitchen.. 1 once daily except 1&1/2 m,w,f & sun 3)  Furosemide 20 Mg Tabs (Furosemide) .[name] Kitchen.. 1 by mouth three times a day 4)  Pacerone 200 Mg Tabs  (Amiodarone hcl) .... One tablet once daily. 5)  Primidone 50 Mg Tabs (Primidone) .... 2 tab two times a day 6)  Flax Seed Oil 1000 Mg Caps (Flaxseed (linseed)) .[name] Kitchen.. 1 by mouth once daily 7)  Multivitamins Tabs (Multiple vitamin) .[name] Kitchen.. 1 tab once daily 8)  Bayer Aspirin 325 Mg Tabs (Aspirin) .[name] Kitchen.. 1 by mouth once daily 9)  Klor-con 20 Meq Pack (Potassium chloride) .[name] Kitchen.. 1 by mouth qd 10)  Fish Oil 1000 Mg Caps (Omega-3 fatty acids) .[name] Kitchen.. 1 cap once daily 11)  Vitamin C 500 Mg Tabs (Ascorbic acid) .[name] Kitchen.. 1 tab once daily 12)  Losartan Potassium 100 Mg Tabs (Losartan potassium) .[name] Kitchen.. 1 once daily 13)  Cinnamon 500 Mg Tabs (Cinnamon) .... 2 caps once daily 14)  Vitamin D 1000 Unit Tabs (Cholecalciferol) .[name] Kitchen.. 1 tab once daily 15)  Propranolol Hcl 20 Mg Tabs (Propranolol hcl) .... Take 1 tab once daily two times a day for tremors 16)  Colcrys 0.6 Mg Tabs (Colchicine) .... Take 1 tab  two times a day 17)  Allopurinol 100 Mg Tabs (Allopurinol) .[name] Kitchen.. 1 once daily  Patient Instructions: 1)  Avoid HFCS sugar as discussed.Allopurinol 100 mg once daily . 2)  Please schedule a follow-up Lab  appointment in 2 months: Uric acid : 274.9; 3)  BMP :401.9; 4)  HbgA1C :790.29;  5)TSH : 244.9.   Orders Added: 1)  Est. Patient Level III [16109]

## 2010-02-09 NOTE — Assessment & Plan Note (Signed)
Summary: 4 mth fu/kdc   Vital Signs:  Patient profile:   77 year old male Weight:      192.4 pounds Pulse rate:   56 / minute Resp:     15 per minute BP sitting:   140 / 74  (left arm) Cuff size:   large  Vitals Entered By: Shonna Chock CMA (July 25, 2009 9:27 AM) CC: 4 month follow-up, discuss labs (copy given), Type 2 diabetes mellitus follow-up   Primary Care Provider:  Dr. Alwyn Ren  CC:  4 month follow-up, discuss labs (copy given), and Type 2 diabetes mellitus follow-up.  History of Present Illness: Labs reviewed & risks discussed. Uric acid up from 8.5 to 9.6. Role of HFCS in raising UA discussed.He is not on HCTZ. Allopurinol D/Ced due to renal insuffibciency. No gout for  > 3 years.   The patient denies polyuria, polydipsia, blurred vision, self managed hypoglycemia, weight loss, weight gain, and numbness of extremities.  Other symptoms include neuropathic pain in bottom of feet.  The patient denies the following symptoms: chest pain, vomiting, orthostatic symptoms, poor wound healing, vision loss, and foot ulcer.  Since the last visit the patient reports good dietary compliance, compliance with medications, and exercising regularly.  The patient has been measuring capillary blood glucose before breakfast; range 137-172 yet A1c 58 in 02/11 & 5.9% on 07/11.  Since the last visit, the patient reports having had eye care by an ophthalmologist  but  no foot care.    Current Medications (verified): 1)  Omeprazole 20 Mg  Cpdr (Omeprazole) .[name] Kitchen.. 1 By Mouth Qd 2)  Levothyroxine Sodium 500 Mcg Solr (Levothyroxine Sodium) .... 0.05mg  1 By Mouth Once Daily Except 1& 1/2 On Weds 3)  Furosemide 20 Mg  Tabs (Furosemide) .[name] Kitchen.. 1 By Mouth Three Times A Day 4)  Pacerone 200 Mg Tabs (Amiodarone Hcl) .... One Tablet Once Daily. 5)  Allopurinol 100 Mg Tabs (Allopurinol) .... 2 By Mouth Once Daily **med D/c** 6)  Primidone 50 Mg  Tabs (Primidone) .... 2 Tab Two Times A Day 7)  Flax Seed Oil 1000 Mg   Caps (Flaxseed (Linseed)) .[name] Kitchen.. 1 By Mouth Once Daily 8)  Multivitamins   Tabs (Multiple Vitamin) .[name] Kitchen.. 1 Tab Once Daily 9)  Bayer Aspirin 325 Mg  Tabs (Aspirin) .[name] Kitchen.. 1 By Mouth Once Daily 10)  Klor-Con 20 Meq  Pack (Potassium Chloride) .[name] Kitchen.. 1 By Mouth Qd 11)  Fish Oil 1000 Mg Caps (Omega-3 Fatty Acids) .[name] Kitchen.. 1 Cap Once Daily 12)  Vitamin C 500 Mg Tabs (Ascorbic Acid) .[name] Kitchen.. 1 Tab Once Daily 13)  Glipizide 5 Mg Xr24h-Tab (Glipizide) .... 1/2 Tab Two Times A Day**med D/c** 14)  Losartan Potassium 100 Mg Tabs (Losartan Potassium) .[name] Kitchen.. 1 Once Daily 15)  Cinnamon 500 Mg Tabs (Cinnamon) .... 2 Caps Once Daily 16)  Vitamin D 1000 Unit Tabs (Cholecalciferol) .[name] Kitchen.. 1 Tab Once Daily 17)  Propranolol Hcl 20 Mg Tabs (Propranolol Hcl) .... Take 1 Tab Once Daily Two Times A Day For Tremors  Allergies: 1)  ! Pcn 2)  ! Neosporin  Review of Systems General:  Denies fatigue. ENT:  Complains of difficulty swallowing; denies hoarseness; Occasional dysphagia with water ;" food occasionally slow going down". CV:  Denies palpitations. GI:  Denies constipation and diarrhea. Derm:  Denies changes in nail beds, dryness, and hair loss. Endo:  Denies cold intolerance and heat intolerance.  Physical Exam  General:  Appears younger than age;well-nourished,in no acute distress; alert,appropriate and cooperative throughout examination  Neck:  No deformities, masses, or tenderness noted. Lungs:  Normal respiratory effort, chest expands symmetrically. Lungs are clear to auscultation, no crackles or wheezes. Heart:  normal rate, regular rhythm, no gallop, no rub, no JVD, no HJR, and grade 2-2.5 /6 systolic murmur.   Pulses:  R and L carotid,radial,dorsalis pedis and posterior tibial pulses are full and equal bilaterally Extremities:  1/2+ left pedal edema and 1/2 + right pedal edema. Mild fungal toenail changes . MCP enlargement. Crepitus of knees Neurologic:  alert & oriented X3.   Skin:  Scattered bruising &  hyperpigmentattion esp of forearms Psych:  memory intact for recent and remote, normally interactive, and good eye contact.     Impression & Recommendations:  Problem # 1:  HYPERGLYCEMIA, FASTING (ICD-790.29) A1c normal His updated medication list for this problem includes:    Glipizide 5 Mg Xr24h-tab (Glipizide) .[name] Kitchen... 1/2 tab two times a day**med d/c**  Problem # 2:  THYROID FUNCTION TEST, ABNORMAL (ICD-794.5)  Problem # 3:  HYPERURICEMIA, ASYMPTOMATIC (ICD-790.6)  Complete Medication List: 1)  Omeprazole 20 Mg Cpdr (Omeprazole) .[name] Kitchen.. 1 by mouth qd 2)  Levothyroxine Sodium 500 Mcg Solr (Levothyroxine sodium) .... 0.05mg  1 by mouth once daily except 1& 1/2 on  m, w & f 3)  Furosemide 20 Mg Tabs (Furosemide) .[name] Kitchen.. 1 by mouth three times a day 4)  Pacerone 200 Mg Tabs (Amiodarone hcl) .... One tablet once daily. 5)  Allopurinol 100 Mg Tabs (Allopurinol) .... 2 by mouth once daily **med d/c** 6)  Primidone 50 Mg Tabs (Primidone) .... 2 tab two times a day 7)  Flax Seed Oil 1000 Mg Caps (Flaxseed (linseed)) .[name] Kitchen.. 1 by mouth once daily 8)  Multivitamins Tabs (Multiple vitamin) .[name] Kitchen.. 1 tab once daily 9)  Bayer Aspirin 325 Mg Tabs (Aspirin) .[name] Kitchen.. 1 by mouth once daily 10)  Klor-con 20 Meq Pack (Potassium chloride) .[name] Kitchen.. 1 by mouth qd 11)  Fish Oil 1000 Mg Caps (Omega-3 fatty acids) .[name] Kitchen.. 1 cap once daily 12)  Vitamin C 500 Mg Tabs (Ascorbic acid) .[name] Kitchen.. 1 tab once daily 13)  Glipizide 5 Mg Xr24h-tab (Glipizide) .... 1/2 tab two times a day**med d/c** 14)  Losartan Potassium 100 Mg Tabs (Losartan potassium) .[name] Kitchen.. 1 once daily 15)  Cinnamon 500 Mg Tabs (Cinnamon) .... 2 caps once daily 16)  Vitamin D 1000 Unit Tabs (Cholecalciferol) .[name] Kitchen.. 1 tab once daily 17)  Propranolol Hcl 20 Mg Tabs (Propranolol hcl) .... Take 1 tab once daily two times a day for tremors 18)  Uloric 40 Mg Tabs (Febuxostat) .[name] Kitchen.. 1 once daily  Patient Instructions: 1)  Please schedule a follow-up appointment in 10  weeks. 2)  BUN,  creat, K+, uric acid,TSH ,Hepatic Panel . Consume< 40 grams of sugar / day from High Fructose Corn Syrup as #1, 2 or # 3 on label. Prescriptions: ULORIC 40 MG TABS (FEBUXOSTAT) 1 once daily  #30 x 5   Entered and Authorized by:   [name] MD   Signed by:   [name] MD on 07/25/2009   Method used:   Print then Give to Patient   RxID:   [id number] LEVOTHYROXINE SODIUM 500 MCG SOLR (LEVOTHYROXINE SODIUM) 0.05MG  1 by mouth once daily EXCEPT 1& 1/2 on  M, W & F  #90 x 1   Entered and Authorized by:   [name] MD   Signed by:   [name] MD on 07/25/2009   Method used:   Print then Give to Patient  RxID:   [id number]

## 2010-02-09 NOTE — Letter (Signed)
Summary: [facility]  [facility]   Imported By: Lanelle Bal 12/06/2009 12:37:19  _____________________________________________________________________  External Attachment:    Type:   Image     Comment:   External Document

## 2010-02-09 NOTE — Letter (Signed)
Summary: [facility]  [facility]   Imported By: Lanelle Bal 09/22/2009 09:36:15  _____________________________________________________________________  External Attachment:    Type:   Image     Comment:   External Document

## 2010-02-09 NOTE — Medication Information (Signed)
Summary: Patient Assistance Form/[employer]  Patient Assistance Form/[employer]   Imported By: Lanelle Bal 10/26/2009 15:53:13  _____________________________________________________________________  External Attachment:    Type:   Image     Comment:   External Document

## 2010-02-17 ENCOUNTER — Encounter: Payer: Self-pay | Admitting: Internal Medicine

## 2010-02-17 ENCOUNTER — Ambulatory Visit (INDEPENDENT_AMBULATORY_CARE_PROVIDER_SITE_OTHER): Payer: BC Managed Care – PPO | Admitting: Internal Medicine

## 2010-02-17 DIAGNOSIS — R21 Rash and other nonspecific skin eruption: Secondary | ICD-10-CM | POA: Insufficient documentation

## 2010-02-17 DIAGNOSIS — I10 Essential (primary) hypertension: Secondary | ICD-10-CM

## 2010-02-23 NOTE — Assessment & Plan Note (Signed)
Summary: BP RUNNING HIGH/RH.....   Vital Signs:  Patient profile:   77 year old male Weight:      197 pounds BMI:     28.37 Temp:     98.4 degrees F oral Pulse rate:   64 / minute Resp:     15 per minute BP sitting:   140 / 70  (left arm) Cuff size:   large  Vitals Entered By: Shonna Chock CMA (February 17, 2010 2:45 PM) CC: B/P elevated at home and examine legs: itchy/dry spots   Primary Care Provider:  Dr. Alwyn Ren  CC:  B/P elevated at home and examine legs: itchy/dry spots.  History of Present Illness:      This is an 77 year old man who presents for Hypertension follow-up; systolic BP elevated recently(124/ 53- 181/77 over past month) .  The patient reports headaches and edema, but denies lightheadedness, urinary frequency, and fatigue.  The patient denies the following associated symptoms: chest pain, chest pressure, exercise intolerance, dyspnea, palpitations, and syncope.  The patient reports that dietary compliance has been good.  Adjunctive measures currently used by the patient include salt restriction.    Allergies: 1)  ! Pcn 2)  ! Neosporin  Physical Exam  Lungs:  Normal respiratory effort, chest expands symmetrically. Lungs are clear to auscultation, no crackles or wheezes. Heart:  normal rate, regular rhythm, no gallop, no rub, no JVD, and grade 1.5 -2  /6 systolic murmur.   Pulses:  R and L carotid,radial,dorsalis pedis and posterior tibial pulses are full and equal bilaterally Extremities:  1+ left & R  pedal edema.   Skin:  eczematoid rash upper calves   Impression & Recommendations:  Problem # 1:  HYPERTENSION (ICD-401.9) suboptimal control His updated medication list for this problem includes:    Furosemide 20 Mg Tabs (Furosemide) .[name] Kitchen... 1 by mouth three times a day    Losartan Potassium 100 Mg Tabs (Losartan potassium) .[name] Kitchen... 1 once daily    Propranolol Hcl 20 Mg Tabs (Propranolol hcl) .[name] Kitchen... Take 1 &1/2 tablets  two times a day  Problem # 2:   RASH-NONVESICULAR (ICD-782.1)  Complete Medication List: 1)  Omeprazole 20 Mg Cpdr (Omeprazole) .[name] Kitchen.. 1 by mouth qd 2)  Levothyroxine Sodium 50 Mcg Tabs (Levothyroxine sodium) .[name] Kitchen.. 1 tab once daily .[name] KitchenMarland Kitchenexcept on m, w, f, &sun take 1 1/2 tab 3)  Furosemide 20 Mg Tabs (Furosemide) .[name] Kitchen.. 1 by mouth three times a day 4)  Pacerone 200 Mg Tabs (Amiodarone hcl) .... One tablet once daily. 5)  Primidone 50 Mg Tabs (Primidone) .... 2 tab two times a day 6)  Flax Seed Oil 1000 Mg Caps (Flaxseed (linseed)) .[name] Kitchen.. 1 by mouth once daily 7)  Multivitamins Tabs (Multiple vitamin) .[name] Kitchen.. 1 tab once daily 8)  Bayer Aspirin 325 Mg Tabs (Aspirin) .[name] Kitchen.. 1 by mouth once daily 9)  Klor-con 20 Meq Pack (Potassium chloride) .[name] Kitchen.. 1 by mouth qd 10)  Fish Oil 1000 Mg Caps (Omega-3 fatty acids) .[name] Kitchen.. 1 cap once daily 11)  Vitamin C 500 Mg Tabs (Ascorbic acid) .[name] Kitchen.. 1 tab once daily 12)  Losartan Potassium 100 Mg Tabs (Losartan potassium) .[name] Kitchen.. 1 once daily 13)  Cinnamon 500 Mg Tabs (Cinnamon) .... 2 caps once daily 14)  Vitamin D 1000 Unit Tabs (Cholecalciferol) .[name] Kitchen.. 1 tab once daily 15)  Propranolol Hcl 20 Mg Tabs (Propranolol hcl) .... Take 1 &1/2 tablets  two times a day 16)  Colcrys 0.6 Mg Tabs (Colchicine) .... Take 1 tab  two  times a day 17)  Allopurinol 100 Mg Tabs (Allopurinol) .[name] Kitchen.. 1 once daily  Patient Instructions: 1)   Mix Eucerin 1 part to 1 part Cort Aid  & apply two times a day to rash. 2)  Check your Blood Pressure regularly. Your goa = AVERAGE < 140/90, ideally < 135/85 .   Orders Added: 1)  Est. Patient Level III [19147]

## 2010-02-24 ENCOUNTER — Ambulatory Visit (INDEPENDENT_AMBULATORY_CARE_PROVIDER_SITE_OTHER): Payer: Medicare Other | Admitting: Cardiology

## 2010-02-24 ENCOUNTER — Encounter: Payer: Self-pay | Admitting: Cardiology

## 2010-02-24 ENCOUNTER — Other Ambulatory Visit: Payer: Self-pay | Admitting: Cardiology

## 2010-02-24 DIAGNOSIS — E119 Type 2 diabetes mellitus without complications: Secondary | ICD-10-CM

## 2010-02-24 DIAGNOSIS — I4891 Unspecified atrial fibrillation: Secondary | ICD-10-CM

## 2010-02-24 LAB — HEPATIC FUNCTION PANEL
ALT: 25 U/L (ref 0–53)
AST: 23 U/L (ref 0–37)
Alkaline Phosphatase: 68 U/L (ref 39–117)
Bilirubin, Direct: 0.1 mg/dL (ref 0.0–0.3)
Total Bilirubin: 0.7 mg/dL (ref 0.3–1.2)
Total Protein: 6.8 g/dL (ref 6.0–8.3)

## 2010-02-24 LAB — BASIC METABOLIC PANEL
CO2: 28 mEq/L (ref 19–32)
Chloride: 100 mEq/L (ref 96–112)
Creatinine, Ser: 1.4 mg/dL (ref 0.4–1.5)
Potassium: 4.2 mEq/L (ref 3.5–5.1)
Sodium: 135 mEq/L (ref 135–145)

## 2010-02-24 LAB — T4, FREE: Free T4: 1 ng/dL (ref 0.60–1.60)

## 2010-03-01 NOTE — Assessment & Plan Note (Signed)
Summary: rov per pt call-mb   Visit Type:  Follow-up Primary Provider:  Dr. Alwyn Ren  CC:  edema in feet and ankles..  History of Present Illness: Mr [name] returns for his PAF and AS. He remains active without sxs of recurrent PAF. Spirometry at the [facility] was normal this week by prelim report I reviewed today. No sxs of AS as well.  Current Medications (verified): 1)  Omeprazole 20 Mg  Cpdr (Omeprazole) .[name] Kitchen.. 1 By Mouth Qd 2)  Levothyroxine Sodium 50 Mcg Tabs (Levothyroxine Sodium) .[name] Kitchen.. 1 Tab Once Daily .[name] KitchenMarland Kitchenexcept On M, W, F, &sun Take 1 1/2 Tab 3)  Furosemide 20 Mg  Tabs (Furosemide) .[name] Kitchen.. 1 By Mouth Three Times A Day 4)  Pacerone 200 Mg Tabs (Amiodarone Hcl) .... One Tablet Once Daily. 5)  Primidone 50 Mg  Tabs (Primidone) .... 2 Tab Two Times A Day 6)  Flax Seed Oil 1000 Mg  Caps (Flaxseed (Linseed)) .[name] Kitchen.. 1 By Mouth Once Daily 7)  Multivitamins   Tabs (Multiple Vitamin) .[name] Kitchen.. 1 Tab Once Daily 8)  Bayer Aspirin 325 Mg  Tabs (Aspirin) .[name] Kitchen.. 1 By Mouth Once Daily 9)  Klor-Con 20 Meq  Pack (Potassium Chloride) .[name] Kitchen.. 1 By Mouth Qd 10)  Fish Oil 1000 Mg Caps (Omega-3 Fatty Acids) .[name] Kitchen.. 1 Cap Once Daily 11)  Vitamin C 500 Mg Tabs (Ascorbic Acid) .[name] Kitchen.. 1 Tab Once Daily 12)  Losartan Potassium 100 Mg Tabs (Losartan Potassium) .[name] Kitchen.. 1 Once Daily 13)  Cinnamon 500 Mg Tabs (Cinnamon) .... 2 Caps Once Daily 14)  Vitamin D 1000 Unit Tabs (Cholecalciferol) .[name] Kitchen.. 1 Tab Once Daily 15)  Propranolol Hcl 20 Mg Tabs (Propranolol Hcl) .... Take 1 &1/2 Tablets  Two Times A Day 16)  Colcrys 0.6 Mg Tabs (Colchicine) .... Take 1 Tab  Two Times A Day 17)  Allopurinol 100 Mg Tabs (Allopurinol) .[name] Kitchen.. 1 Once Daily  Allergies (verified): 1)  ! Pcn 2)  ! Neosporin  Past History:  Past Medical History: Last updated: 12/06/2009 COUMADIN THERAPY (ICD-V58.61) CVA (ICD-434.91) ATRIAL FIBRILLATION (ICD-427.31) CAD, NATIVE VESSEL (ICD-414.01) BRADYCARDIA (ICD-427.89) EDEMA (ICD-782.3) AORTIC STENOSIS, MILD  (ICD-424.1) HYPERTENSION (ICD-401.9) DIABETES MELLITUS, CONTROLLED (ICD-250.00) ABRASION, ARM (ICD-919.0) JOINT EFFUSION, LEFT KNEE (ICD-719.06) LEUKOPENIA, CHRONIC (ICD-288.50) MULTIPLE&UNSPEC OPEN WOUND LOWER LIMB COMP (ICD-894.1) UNSPECIFIED ANEMIA (ICD-285.9) PAIN IN SOFT TISSUES OF LIMB (ICD-729.5) CELLULITIS AND ABSCESS OF UNSPECIFIED SITE (ICD-682.9) ACTION TREMOR (ICD-333.1) POPLITEAL CYST, RIGHT (ICD-727.51) Family Hx of ADENOCARCINOMA, COLON (ICD-153.9) Family Hx of HX, FAMILY, STROKE (ICD-V17.1) THROMBOCYTOPENIA (ICD-287.5) THROAT CANCER (ICD-149.9) DEGENERATIVE DISC DISEASE (ICD-722.6) POLYP, COLON (ICD-211.3) ANXIETY (ICD-300.00) SPLENOMEGALY (ICD-789.2) GOUT (ICD-274.9) GERD (ICD-530.81) ABNORMAL THYROID FUNCTION TESTS (ICD-794.5) 11/2009 : Cellulitis , PCP     Past Surgical History: Last updated: 06/12/2007 Cholecystectomy  Family History: Last updated: 06/12/2007 [place]  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  Alcohol Use - no  Risk Factors: Smoking Status: never (05/15/2008)  Review of Systems       negativee other than HPI  Vital Signs:  Patient profile:   77 year old male Height:      70 inches Weight:      196 pounds BMI:     28.22 Pulse rate:   56 / minute Resp:     18 per minute BP sitting:   148 / 68  (left arm) Cuff size:   regular  Vitals Entered By: Celestia Khat, CMA (February 24, 2010 12:33 PM)  Physical Exam  General:  Well developed, well nourished, in no acute distress. Head:  normocephalic and atraumatic Eyes:  PERRLA/EOM intact; conjunctiva and lids normal. Neck:  Neck supple, no JVD. No masses, thyromegaly or abnormal cervical nodes. Chest Keval Nam:  no deformities or breast masses noted Lungs:  Clear bilaterally to auscultation and percussion. Heart:  RRR, NI S1 DECREASED S2 BUT SPLITS, BILATERAL REFERRED SOUND AT BASE  OF NECK Msk:  Back normal, normal gait. Muscle strength and tone normal. Pulses:  pulses normal in all 4 extremities Extremities:  No clubbing or cyanosis. Neurologic:  Alert and oriented x 3. Skin:  Intact without lesions or rashes. Psych:  Normal affect.   Impression & Recommendations:  Problem # 1:  ATRIAL FIBRILLATION (ICD-427.31) Assessment Improved Check amio labs. His updated medication list for this problem includes:    Pacerone 200 Mg Tabs (Amiodarone hcl) ..... One tablet once daily.    Bayer Aspirin 325 Mg Tabs (Aspirin) .[name] Kitchen... 1 by mouth once daily    Propranolol Hcl 20 Mg Tabs (Propranolol hcl) .[name] Kitchen... Take 1 &1/2 tablets  two times a day  Orders: EKG w/ Interpretation (93000) TLB-BMP (Basic Metabolic Panel-BMET) (80048-METABOL) TLB-Hepatic/Liver Function Pnl (80076-HEPATIC) TLB-TSH (Thyroid Stimulating Hormone) (84443-TSH) TLB-T4 (Thyrox), Free [phone])  Problem # 2:  COUMADIN THERAPY (ICD-V58.61) Assessment: Unchanged  Orders: TLB-BMP (Basic Metabolic Panel-BMET) (80048-METABOL) TLB-Hepatic/Liver Function Pnl (80076-HEPATIC) TLB-TSH (Thyroid Stimulating Hormone) (84443-TSH) TLB-T4 (Thyrox), Free [phone])  Problem # 3:  CAD, NATIVE VESSEL (ICD-414.01) Assessment: Unchanged  His updated medication list for this problem includes:    Bayer Aspirin 325 Mg Tabs (Aspirin) .[name] Kitchen... 1 by mouth once daily    Propranolol Hcl 20 Mg Tabs (Propranolol hcl) .[name] Kitchen... Take 1 &1/2 tablets  two times a day  Problem # 4:  BRADYCARDIA (ICD-427.89) Assessment: Unchanged  His updated medication list for this problem includes:    Pacerone 200 Mg Tabs (Amiodarone hcl) ..... One tablet once daily.    Bayer Aspirin 325 Mg Tabs (Aspirin) .[name] Kitchen... 1 by mouth once daily    Propranolol Hcl 20 Mg Tabs (Propranolol hcl) .[name] Kitchen... Take 1 &1/2 tablets  two times a day  Problem # 5:  EDEMA (ICD-782.3) Assessment: Improved  Problem # 6:  AORTIC STENOSIS, MILD (ICD-424.1) Assessment:  Unchanged  His updated medication list for this problem includes:    Furosemide 20 Mg Tabs (Furosemide) .[name] Kitchen... 1 by mouth three times a day    Losartan Potassium 100 Mg Tabs (Losartan potassium) .[name] Kitchen... 1 once daily    Propranolol Hcl 20 Mg Tabs (Propranolol hcl) .[name] Kitchen... Take 1 &1/2 tablets  two times a day  Other Orders: TLB-A1C / Hgb A1C (Glycohemoglobin) (83036-A1C)  Patient Instructions: 1)  Your physician recommends that you schedule a follow-up appointment in: 6 months with DR.Jadalynn Burr 2)  Your physician recommends that you have lab work today 3)  Your physician recommends that you continue on your current medications as directed. Please refer to the Current Medication list given to you today.

## 2010-03-06 ENCOUNTER — Other Ambulatory Visit: Payer: Self-pay

## 2010-03-21 LAB — INFLUENZA PANEL BY PCR (TYPE A & B)
H1N1 flu by pcr: NOT DETECTED
Influenza A By PCR: NEGATIVE
Influenza B By PCR: NEGATIVE

## 2010-03-21 LAB — GLUCOSE, CAPILLARY: Glucose-Capillary: 143 mg/dL — ABNORMAL HIGH (ref 70–99)

## 2010-03-21 LAB — DIFFERENTIAL
Basophils Relative: 0 % (ref 0–1)
Eosinophils Relative: 1 % (ref 0–5)
Lymphocytes Relative: 6 % — ABNORMAL LOW (ref 12–46)
Monocytes Absolute: 0.2 10*3/uL (ref 0.1–1.0)
Neutrophils Relative %: 87 % — ABNORMAL HIGH (ref 43–77)

## 2010-03-21 LAB — TSH: TSH: 1.798 u[IU]/mL (ref 0.350–4.500)

## 2010-03-21 LAB — BASIC METABOLIC PANEL
Calcium: 8.4 mg/dL (ref 8.4–10.5)
Chloride: 111 mEq/L (ref 96–112)
Creatinine, Ser: 1.94 mg/dL — ABNORMAL HIGH (ref 0.4–1.5)
GFR calc Af Amer: 40 mL/min — ABNORMAL LOW (ref >60)
GFR calc Af Amer: 43 mL/min — ABNORMAL LOW (ref >60)
GFR calc non Af Amer: 35 mL/min — ABNORMAL LOW (ref >60)
Potassium: 4 mEq/L (ref 3.5–5.1)
Potassium: 4.4 mEq/L (ref 3.5–5.1)
Sodium: 140 mEq/L (ref 135–145)
Sodium: 143 mEq/L (ref 135–145)

## 2010-03-21 LAB — URINALYSIS, ROUTINE W REFLEX MICROSCOPIC
Ketones, ur: NEGATIVE mg/dL
Nitrite: NEGATIVE
Protein, ur: NEGATIVE mg/dL
pH: 5.5 (ref 5.0–8.0)

## 2010-03-21 LAB — CBC
Hemoglobin: 11.1 g/dL — ABNORMAL LOW (ref 13.0–17.0)
Hemoglobin: 8.7 g/dL — ABNORMAL LOW (ref 13.0–17.0)
MCHC: 35.2 g/dL (ref 30.0–36.0)
MCV: 90.5 fL (ref 78.0–100.0)
Platelets: 43 10*3/uL — ABNORMAL LOW (ref 150–400)
Platelets: 45 10*3/uL — ABNORMAL LOW (ref 150–400)
RBC: 2.75 MIL/uL — ABNORMAL LOW (ref 4.22–5.81)
RBC: 2.79 MIL/uL — ABNORMAL LOW (ref 4.22–5.81)
RDW: 17.8 % — ABNORMAL HIGH (ref 11.5–15.5)
WBC: 1.8 10*3/uL — ABNORMAL LOW (ref 4.0–10.5)
WBC: 1.8 10*3/uL — ABNORMAL LOW (ref 4.0–10.5)

## 2010-03-21 LAB — URINE CULTURE: Culture  Setup Time: 201111191057

## 2010-03-21 LAB — CULTURE, BLOOD (ROUTINE X 2): Culture: NO GROWTH

## 2010-03-21 LAB — COMPREHENSIVE METABOLIC PANEL
ALT: 27 U/L (ref 0–53)
AST: 35 U/L (ref 0–37)
Albumin: 3.9 g/dL (ref 3.5–5.2)
Calcium: 8.7 mg/dL (ref 8.4–10.5)
GFR calc Af Amer: 49 mL/min — ABNORMAL LOW (ref >60)
Sodium: 137 mEq/L (ref 135–145)
Total Protein: 6.1 g/dL (ref 6.0–8.3)

## 2010-03-21 LAB — CLOSTRIDIUM DIFFICILE BY PCR: Toxigenic C. Difficile by PCR: NOT DETECTED

## 2010-03-21 LAB — PROCALCITONIN: Procalcitonin: 0.43 ng/mL

## 2010-04-12 ENCOUNTER — Ambulatory Visit (HOSPITAL_BASED_OUTPATIENT_CLINIC_OR_DEPARTMENT_OTHER)
Admission: RE | Admit: 2010-04-12 | Discharge: 2010-04-12 | Disposition: A | Discharge status: Home / Self Care | Payer: Medicare Other | Source: Ambulatory Visit | Attending: Family Medicine | Admitting: Family Medicine

## 2010-04-12 ENCOUNTER — Ambulatory Visit (INDEPENDENT_AMBULATORY_CARE_PROVIDER_SITE_OTHER): Payer: Medicare Other | Admitting: Family Medicine

## 2010-04-12 ENCOUNTER — Encounter: Payer: Self-pay | Admitting: Family Medicine

## 2010-04-12 ENCOUNTER — Other Ambulatory Visit: Payer: Self-pay | Admitting: Family Medicine

## 2010-04-12 ENCOUNTER — Ambulatory Visit: Payer: Medicare Other | Admitting: Family Medicine

## 2010-04-12 DIAGNOSIS — M7120 Synovial cyst of popliteal space [Baker], unspecified knee: Secondary | ICD-10-CM

## 2010-04-12 DIAGNOSIS — R609 Edema, unspecified: Secondary | ICD-10-CM

## 2010-04-12 DIAGNOSIS — M79609 Pain in unspecified limb: Secondary | ICD-10-CM | POA: Insufficient documentation

## 2010-04-12 DIAGNOSIS — M79606 Pain in leg, unspecified: Secondary | ICD-10-CM

## 2010-04-12 NOTE — Progress Notes (Signed)
  Subjective:    Patient ID: [name], male    DOB: Nov 29, 1922, 77 y.o.   MRN: [id number]  HPI Bilateral leg swelling- R>L, swelling has been increasing over the course of 'a few weeks'.  R leg is site of vein harvest.  Reports urinating normally.  Has hx of intermittant edema.  Reports he can 'feel the fluid running down under my skin when i stand up in the morning'.  Hx of cellulitis.  R leg is painful behind the knee.  Denies recent immobility or travel.  Thinks legs look better today than yesterday.   Review of Systems For ROS see HPI    Objective:   Physical Exam  Constitutional: He appears well-developed and well-nourished. No distress.  HENT:  Head: Normocephalic and atraumatic.  Cardiovascular: Normal rate, regular rhythm and intact distal pulses.   Pulmonary/Chest: Effort normal and breath sounds normal. No respiratory distress. He has no wheezes. He has no rales.  Musculoskeletal: He exhibits edema. He exhibits no tenderness.       +2 edema on RLE +1 edema on LLE          Assessment & Plan:

## 2010-04-12 NOTE — Patient Instructions (Signed)
Schedule a lab visit next week to check your potassium We will call you with your ultrasound results- Take your 1st Right on [address], Turn Left on [address], Turn Right on [address] (the [employer] is on your Left)  Go to the [employer] Either increase your Lasix to 4 pills in the morning or continue the 3 pills in the morning and add 1 or 2 extra pills as needed for swelling Continue to limit your salt intake Elevate your legs as much as possible Call with any questions or concerns Hang in there! Happy [name]!

## 2010-04-13 ENCOUNTER — Other Ambulatory Visit: Payer: Self-pay | Admitting: Internal Medicine

## 2010-04-16 NOTE — Assessment & Plan Note (Signed)
This is not a new problem for pt- seems to be intermittant.  Legs are 'improved' today as compared to yesterday.  Given that R leg is more swollen than L, will get doppler to r/o clot.  Will also assess presence of Baker's cyst since pt has hx of this.  Will increase Lasix and have pt return next week for repeat K+.  Reviewed supportive care and red flags that should prompt return.  Pt expressed understanding and is in agreement w/ plan.

## 2010-04-20 ENCOUNTER — Other Ambulatory Visit (INDEPENDENT_AMBULATORY_CARE_PROVIDER_SITE_OTHER): Payer: Medicare Other

## 2010-04-20 DIAGNOSIS — R609 Edema, unspecified: Secondary | ICD-10-CM

## 2010-04-20 LAB — BASIC METABOLIC PANEL
Calcium: 9 mg/dL (ref 8.4–10.5)
GFR: 47.52 mL/min — ABNORMAL LOW (ref >60.00)
Sodium: 135 mEq/L (ref 135–145)

## 2010-04-25 ENCOUNTER — Encounter: Payer: Self-pay | Admitting: *Deleted

## 2010-04-27 ENCOUNTER — Telehealth: Payer: Self-pay | Admitting: Family Medicine

## 2010-04-27 NOTE — Telephone Encounter (Signed)
Pt aware K+ and kidney fxn are both stable. Can continue lasix as needed.

## 2010-04-27 NOTE — Telephone Encounter (Signed)
Patient called to check on results of lab work from 4/12--checking potassium---please call about results

## 2010-05-18 ENCOUNTER — Encounter (HOSPITAL_BASED_OUTPATIENT_CLINIC_OR_DEPARTMENT_OTHER): Payer: Medicare Other | Admitting: Oncology

## 2010-05-18 ENCOUNTER — Other Ambulatory Visit: Payer: Self-pay | Admitting: Oncology

## 2010-05-18 DIAGNOSIS — D696 Thrombocytopenia, unspecified: Secondary | ICD-10-CM

## 2010-05-18 DIAGNOSIS — R161 Splenomegaly, not elsewhere classified: Secondary | ICD-10-CM

## 2010-05-18 DIAGNOSIS — D61818 Other pancytopenia: Secondary | ICD-10-CM

## 2010-05-18 LAB — CBC WITH DIFFERENTIAL/PLATELET
BASO%: 0.2 % (ref 0.0–2.0)
EOS%: 1.5 % (ref 0.0–7.0)
HCT: 35.7 % — ABNORMAL LOW (ref 38.4–49.9)
LYMPH%: 18.2 % (ref 14.0–49.0)
MCH: 31.1 pg (ref 27.2–33.4)
MCHC: 34.4 g/dL (ref 32.0–36.0)
NEUT%: 76.3 % — ABNORMAL HIGH (ref 39.0–75.0)
RBC: 3.95 10*6/uL — ABNORMAL LOW (ref 4.20–5.82)
lymph#: 0.6 10*3/uL — ABNORMAL LOW (ref 0.9–3.3)

## 2010-05-23 NOTE — Assessment & Plan Note (Signed)
[facility]                            CARDIOLOGY OFFICE NOTE   JAYSHON, DOMMER                       MRN:          [id number]  DATE:09/08/2007                            DOB:          [birth year]    Mr. Halladay comes in today for further management and followup.  I  placed him on metoprolol 25 mg a day for his intention tremor and atrial  fib.  He went to the [place] for blood work and to get his meds renewed, and  they switched him to propranolol 40 b.i.d.  His heart rate is 48 today.  He denies any syncope or presyncope.   His other meds are the same.  We have switched him from amiodarone to  aspirin 324 a day because of his bleeding.  He also did this because of  his falls.   He has not had any further falls.  He is being a little more careful.  He is rarely in atrial fib.   PHYSICAL EXAMINATION:  VITAL SIGNS:  His blood pressure today is 144/68,  pulse 45-48 and regular, and weight is 187.  HEENT:  Unchanged.  NECK:  Carotids are full.  Thyroid is not enlarged.  LUNGS:  Clear.  HEART:  Reveals a systolic murmur along the left sternal border.  It is  difficult to hear S2 split as usual.  ABDOMEN:  Soft.  Good bowel sounds.  No midline bruit.  EXTREMITIES:  No cyanosis, clubbing, or edema.  His ecchymoses have  cleared out dramatically, off Coumadin.  He still has a few bruises.  NEURO:  Intact, otherwise, and his intention tremor is actually better.   ASSESSMENT AND PLAN:  Mr. Stancil is doing well.  I have cut his  propranolol back to 20 b.i.d. for increasing his heart rate and avoiding  any bradycardia-associated complications.  I will see him back again in  2 months.  At that time, he will probably need amiodarone, blood work,  and he wants a flu shot.     Thomas C. Daleen Squibb, MD, [facility]  Electronically Signed    TCW/MedQ  DD: 09/08/2007  DT: 09/08/2007  Job #: [id number]

## 2010-05-23 NOTE — Assessment & Plan Note (Signed)
[facility]                            CARDIOLOGY OFFICE NOTE   NESTOR, WIENEKE                       MRN:          [id number]  DATE:07/21/2007                            DOB:          10/09/1922    Mr. Locker comes in today after being seen in the hospital with a  series of accidents, non-syncope related, that resulted in cellulitis of  his left lower extremity and severe bruising of his right lower  extremity.   He was on Coumadin, of course, at that time.  It was stopped.  He was  also found to be pancytopenic with a hemoglobin of 9.2, chronic  thrombocytopenia and his white count was suppressed.  He is being seen  by Dr. Donnie Coffin of Hematology who ordered some blood work this morning.  His iron levels were also low at 26.  His reticulocyte count was only  0.8.   I had a long discussion with the primary care team and we felt Mr.  Brawley should come off of Coumadin.  He is still doing a lot of  activities at age 77 that he probably should not be doing.   He also has very infrequent atrial fib and we felt like his  thromboembolic risk is low.  He is still on aspirin 81 mg a day.  I have  asked him to increase this to 325 a day.   His biggest complaint is tremor with intention.  He is also not sleeping  well.   MEDICATIONS:  Cardiac meds at this time include:  1. Furosemide 60 mg a day.  2. Potassium 20 mEq a day.  3. Aspirin 81 mg a day.  4. Pacerone 200 mg p.o. b.i.d.  5. Fish oil.   PHYSICAL EXAMINATION:  GENERAL:  He is a very pleasant gentleman in no  acute distress.  VITAL SIGNS:  His blood pressure 130/82.  His pulse is 70 and regular.  His weight is 182, down 8.  HEENT:  Normocephalic and atraumatic.  No other changes.  NECK:  Carotid upstrokes are equal bilaterally without bruits.  Thyroid  is not enlarged.  Trachea is midline.  LUNGS:  Clear except for decreased breath sounds throughout.  CARDIAC:  Systolic murmur, which is  stable.  He has regular rate and  rhythm.  ABDOMEN:  Soft.  Good bowel sounds.  EXTREMITIES:  Resolving ecchymoses and cellulitis is absent.  Pulses are  intact.  He does have a tremor that gets worse with intention  particularly of the left hand.   ASSESSMENT AND PLAN:  Mr. Shifflett is still being evaluated for his  pancytopenia.  He is rarely in atrial fibrillation, unable to stay with  aspirin 325 mg a day.  I have also prescribed  metoprolol succinate 25 mg a day for possible help with his intention  tremor.  I will see him back in about 4 weeks.     Thomas C. Daleen Squibb, MD, [facility]  Electronically Signed    TCW/MedQ  DD: 07/21/2007  DT: 07/21/2007  Job #: [id number]

## 2010-05-23 NOTE — Assessment & Plan Note (Signed)
[facility]                            CARDIOLOGY OFFICE NOTE   HIROKI, WINT                       MRN:          [id number]  DATE:11/14/2006                            DOB:          11/25/1922    [name] returns today for management of the following issues.  1. Chronic atrial fibrillation.  He maintains in sinus rhythm with      Pacerone.  He has not had any breakthroughs clinically.  2. Anticoagulation.  3. History of chest pain, negative stress Myoview December 13, 2004.  4. Normal left ventricular function.  5. Mild aortic stenosis by 2 D echocardiogram, 2007.  6. Hypertension.  7. History of lower extremity edema responsive to Lasix.   He is not sleeping well and has some chronic fatigue.  Otherwise, he has  no specific cardiac complaints.   MEDICATIONS:  1. Niacin 500 mg daily.  2. Flax seed 1 daily.  3. Gingko daily.  4. Multivitamin daily.  5. Coumadin per pharmacy.  6. Omeprazole 20 mg daily.  7. Primidone 50 mg b.i.d.  8. Allopurinol 300 mg daily.  9. Furosemide 60 mg daily.  10.Potassium 20 mEq daily.  11.Aspirin 81 mg daily.  12.Pacerone 200 mg b.i.d.  13.Fish oil 1 daily.  14.Levothyroxine 0.025 mg daily.   EXAM:  Blood pressure 148/76, pulse 64 and regular.  Weight is 184.  HEENT:  He has a lot of spider angiomas.  Facial color is a bit ruddy.  PERRLA.  Extraocular movements intact.  Sclerae injected.  Facial  symmetry is normal.  Carotids are full with soft systolic sounds versus bruits.  Thyroid is  not enlarged.  Trachea is midline.  LUNGS:  Decreased breath sounds throughout.  Few Velcro rales in the  bases.  HEART:  A nondisplaced PMI.  Normal S1, S2.  He has a systolic murmur  consistent with aortic stenosis.  It was difficult to hear an S2 split.  ABDOMEN:  Soft, good bowel sounds.  No tenderness.  EXTREMITIES:  No edema.  Pulses are present.  NEURO:  Intact.   ASSESSMENT AND PLAN:  [name] is doing well.   We will check  amiodarone labs today as well as labs on Coumadin including a CBC.  He  would like a copy of these to send to the [place].  I have made no change in  his medical program.  We will see him back in 3 months.     Thomas C. Daleen Squibb, MD, [facility]  Electronically Signed    TCW/MedQ  DD: 11/14/2006  DT: 11/14/2006  Job #: [id number]

## 2010-05-23 NOTE — Assessment & Plan Note (Signed)
[facility]                        [place] OFFICE NOTE   HATCHER, FRONING                       MRN:          [id number]  DATE:05/14/2006                            DOB:          [birth year]    [name] was seen May 14, 2006 to evaluate pain in the shoulder and abdomen  related to trauma.   The second Saturday in April, after using a shotgun [place] hunting, he  experienced ecchymosis of the right upper extremity.  Since that time,  he will be awakened by pain in the right upper extremity.  He has no  significant symptoms during the day.   Subsequently, he was changing a tire when the tool flew off and struck  him in the abdomen and right flank.  He has had residual pain at the  site of the hematoma.   Additionally, today he struck his left forearm on the metal hanger in  the bathroom and denuded the left forearm,  a skin laceration  ofapproximately 4 cm x 2 cm.   Last tetanus is unknown.   PHYSICAL EXAMINATION:  EXTREMITIES:  There is full range of motion of  the shoulder and good strength in the hands.  Extensive  hyperpigmentation changes over his forearms is related to previous  trauma.  ABDOMEN:  He has a 2 x 2 cm subcutaneous hematoma of the right abdomen,  with extensive ecchymosis extending posteriorly on the right.  Bowel  sounds are active, and he is tender only at the hematoma.   His INR was 2.8 on May 01, 2006.   He has posttraumatic bleeding related to his Coumadin and aspirin  therapy.  He plans on [place] hunting again with a shotgun.  He will need  to pad the shoulder in order to prevent recurrence.  He is at high risk  for posttraumatic bleeding related to such injuries.  The abrasion of the left forearm was dressed; he should monitor for  purulence or red streaks up the  arm or fever.  I would expect the  avulsed dermis to slough.  Tetanus booster was given.   A cervical pillow would be recommended overnight, and  he will be given a  prescription for Darvocet-N to be taken at bedtime or during the night  if he is having pain.  This would be least likely to interfere with his  Coumadin therapy.     Titus Dubin. Alwyn Ren, MD,FACP,FCCP  Electronically Signed    [facility]/MedQ  DD: 05/14/2006  DT: 05/14/2006  Job #: [id number]

## 2010-05-23 NOTE — Discharge Summary (Signed)
NAME:  [name], [name] NO.:  [id number]   MEDICAL RECORD NO.:  [id number]          PATIENT TYPE:  INP   LOCATION:  6706                         FACILITY:  [facility]   PHYSICIAN:  Valerie A. Felicity Coyer, MDDATE OF BIRTH:  1922/04/30   DATE OF ADMISSION:  06/12/2007  DATE OF DISCHARGE:  06/18/2007                               DISCHARGE SUMMARY   DISCHARGE DIAGNOSES:  1. Left lower extremity cellulitis.  2. Pancytopenia, question mild underlying myelodysplastic syndrome      status post hematology evaluation.  3. Acute renal insufficiency.  4. History of atrial fibrillation.  5. Hypotension/volume depletion during this admission.  6. Degenerative disk disease.  7. Anxiety.  8. Hypothyroidism.  9. Gastroesophageal reflux disease.   HISTORY OF PRESENT ILLNESS:  [name] is an 77 year old male who was  admitted on June 12, 2007 with chief complaint of fever and chills.  He  noted that 6 days prior to this admission, he fell on a billboard and  suffered a large bruise to his right thigh.  Two weeks prior to this  admission, he cut his left leg with emergent visit to the emergency  department where he received 4 sutures which popped out.  He was not  put on any antibiotics.  His leg was very hot and warm.  He was noted to  have cellulitis.  Coumadin checked in [facility] on June 2  noted an INR of 1.9.  He was admitted for further evaluation and  treatment.   PAST MEDICAL HISTORY:  1. Atrial fibrillation, on chronic Coumadin prior to this admission.  2. GERD.  3. Gout.  4. Hypertension.  5. Action tremor.  6. Popliteal cyst on the right neck.  7. Chronic thrombocytopenia.  8. History of throat cancer diagnosed in the 90s.  9. Degenerative disk disease.  10.History of colon polyps.  11.Anxiety.  12.History of splenomegaly.  13.History of CVA.  14.History of cholecystectomy.   COURSE OF HOSPITALIZATION:  1. Left lower extremity cellulitis.  The patient  was admitted.  He was      placed on broad-spectrum antibiotics which included Zosyn.  This      was later changed to Cipro, vancomycin, and ceftazidime.  He      continued to improve clinically.  He was then transitioned over to      Avelox which he has tolerated well.  He did have some acute renal      insufficiency and dehydration during this admission with      temperatures as high as 102.  Blood cultures are negative x2 at      time of this dictation.  His left lower extremity cellulitis is      clinically improving.  This time we are planning discharge to home      and to continue Avelox p.o. to complete a total of the 10-day      course.  The patient will be followed by home health RN to assist      with local wound care of anterior shin wound.  2. Pancytopenia.  The patient  has a history of chronic      thrombocytopenia.  He did have a drift in his hemoglobin.  CT of      the abdomen and pelvis was performed which noted no large hematoma,      although his hemoglobin drifted during this admission.  He did      receive 2 units of packed red blood cells during this admission as      well as 1 unit of FFP.  He did develop extensive ecchymosis on his      left calf secondary to cellulitis which may have accounted for some      of this hemoglobin drift.  He currently remains hemodynamically      stable.  Hematology consult was requested.  The patient was seen by      Dr. Pierce Walton.  It was his recommendation to discontinue Coumadin      as he felt that his risk of bleed was greater than his risk of      clot.  The patient was also seen in followup by Dr. Valera Castle who      agreed that anticoagulation should be discontinued.  INR at time of      discharge was 1.5 and is trending downward as he is currently off      of Coumadin.  His white count at time of discharge is improved at      3.6.   DISCHARGE MEDICATIONS:  1. Omeprazole 20 mg p.o. daily.  2. Synthroid 25 mcg tabs  one-half tablets p.o. daily.  3. Furosemide 20 mg p.o. b.i.d.  4. Pacerone 200 mg p.o. b.i.d.  5. Allopurinol 300 mg p.o. daily.  6. Primidone 50 mg p.o. daily.  7. Niacin 500 mg p.o. daily.  8. Flaxseed oil 1000 mg as before.  9. Coumadin 5 mg p.o. daily.  10.Aspirin 81 mg p.o. daily.  11.Ginkgo 60 mg p.o. daily as before.  12.Klor-Con 20 mEq p.o. daily.  13.Avelox 400 mg p.o. daily for 5 days.   DISPOSITION:  The patient will be discharged to home with home health RN  for local wound care.   FOLLOWUP:  He is to follow up with Dr. [name] on Tuesday, June  16 at 2:45 p.m.  He is instructed to keep his left leg elevated and to  call Dr. Alwyn Walton should he develop increased swelling, pain, or redness  of the left leg or should he develop fever greater than 101.   PERTINENT DISCHARGE LABORATORY DATA:  Blood cultures on June 12, 2007  remained no growth to date x2.  Hemoglobin 10.3, hematocrit 29.8, white  blood cell count 3.6, platelets 81.  INR 1.5.      Sandford Craze, NP      Raenette Rover. Felicity Coyer, MD  Electronically Signed    MO/MEDQ  D:  06/18/2007  T:  06/19/2007  Job:  [id number]   cc:   Walter Dubin. Alwyn Ren, MD,FACP,FCCP  Walter Sans. Daleen Squibb, MD, [facility]  Walter Crane, MD

## 2010-05-23 NOTE — Consult Note (Signed)
NAME:  [name], STEIL NO.:  [id number]   MEDICAL RECORD NO.:  [id number]          PATIENT TYPE:  INP   LOCATION:  6706                         FACILITY:  [facility]   PHYSICIAN:  Jesse Sans. Wall, MD, FACCDATE OF BIRTH:  May 31, 1922   DATE OF CONSULTATION:  06/16/2007  DATE OF DISCHARGE:                                 CONSULTATION   I was asked by the [facility], specifically Dr. Drue Novel and  Dr. Felicity Coyer, to consult on [name] for question of discontinuing  his Coumadin.   HISTORY OF PRESENT ILLNESS:  [name] is an 77 year old gentleman who  I know well.  His cardiac problems include;  1. Paroxysmal atrial fibrillation, fairly well controlled on      amiodarone.  He does have occasional breakthroughs.  2. Mild aortic stenosis, last echo April 28, 2007.  3. Normal left ventricular function.  4. Normal left atrial size.  5. Hypertension.  6. History of anticoagulation with easy bruisability, particularly the      left upper extremities.  We try to keep his INR around 2.  7. Chronic lower extremity edema, responsive to Lasix.  8. History of chest discomfort, negative stress Myoview, December 13, 2004.   He was admitted to Dr. Leta Jungling office on Friday.  He was admitted because  of a significant hematoma of his right upper groin area from a fall well  outlined in the chart.  He did not have a syncopal event.  He also had  left lower extremity cellulitis and hemorrhage and hematoma from a cut  he received on thin piece of metal on a roof.  He had this sutured in  the emergency room.   He said the sutures busted loose and he started to have some drainage.   He was found to have a hemoglobin of 9.2 on admission.  He has a history  of chronic thrombocytopenia and now pancytopenia.  He was seen by Dr.  Donnie Coffin of hematology who recommend that they discontinue his Coumadin.   His Protime on admission was an INR of 2.0.  His fecal occult blood has  been negative.  His iron levels were low at a level of 26 and ferritin  was 351.  Reticulocyte count 0.8.  He has chronic renal sufficiency with  his creatinine around 1.7-2.0 in the office.   PAST MEDICAL HISTORY:  Significant for no known drug allergies.   MEDICATIONS PRIOR TO ADMISSION:  1. Amiodarone 200 mg p.o. b.i.d. which has kept him in the sinus      rhythm.  2. Coumadin trying to keep an INR of 2-2.5.  3. Levothyroxine 0.025 mg per day.  4. Aspirin 81 mg a day.  5. Potassium 20 mEg a day.  6. Furosemide 60 mg a day.   HABITS:  He does not use alcohol or tobacco.   SOCIAL HISTORY:  He is married, has 3 children, 4 grandchildren, and  retired from the [employer].  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  At 48, he probably  does a lot of activities he  should not be doing and I discussed candidly today.   REVIEW OF SYSTEMS:  He has had a headache over the back part of his head  to the front of his forehead for the last 3 days.  He denied this  several days ago.  He has not hit his head.  He denies any chest pain,  shortness of breath, tachy palpitations, orthopnea, PND.  His other  review of systems are in the HPI and the rest of them are unremarkable  and all queried.   PHYSICAL EXAMINATION:  GENERAL:  Today, he is very pleasant.  He is at  baseline mental status, alert, and oriented.  VITAL SIGNS:  Blood pressure is 137/83, his pulse is 68 and regular, his  respiratory rate is 18, his temperature is 98, and his saturations are  96% on room air.  HEENT:  Normocephalic, atraumatic.  PERRL.  Extraocular movements are  intact.  Sclerae are clear.  Facial symmetry is normal.  NECK:  Supple.  Carotids reveal soft systolic sounds bilaterally.  Thyroid is not enlarged.  Trachea is midline.  LUNGS:  Remarkable for bibasilar crackles which were baseline.  HEART:  Reveals a nondisplaced PMI.  He has a systolic murmur and it is  difficult to hear his S2  split.  ABDOMEN:  Soft, good bowel sounds.  There is no obvious  hepatosplenomegaly.  EXTREMITIES:  Reveal a hematoma of the right upper thigh.  He has 1+  pitting edema in that leg.  His dorsalis pedis pulses are 2+/4+,  posterior tibialis are 1+/4+.  His left lower extremity shows an area of  dark red cellulitis which is why his skin is getting darker.  He clearly  has a fair amount of blood under the skin.  NEUROLOGIC:  Grossly intact.  SKIN:  As already described.  MUSCULOSKELETAL:  Chronic arthritic changes.   LABORATORY DATA:  CT scan of the abdomen and pelvis did not show  retroperitoneal hematoma or bleed, but he does have some stranding in  his left psoas muscle.  He has not had a head CT.   ASSESSMENT/PLAN:  [name] clearly presents with 2 falls and  significant blood loss.  He is now pancytopenic and iron deficient.  He  clearly needs to come off Coumadin.  The fact that he stays in sinus  rhythm most of the time decreases his risk of thromboembolic event.  However, if he goes again on atrial fibrillation, he is at significant  risk of having a stroke.  This I have discussed with he and his wife  today.   His headache is disturbing.  He needs a head CT.   PLAN:  1. Discontinue Coumadin.  2. Discontinue aspirin for now, hopefully discontinue prior to      discharge.  3. CT scan without contrast of the head to rule out subdural hematoma.   Thank you very much for the consultation.     [name] Squibb, MD, [facility]  Electronically Signed    TCW/MEDQ  D:  06/16/2007  T:  06/17/2007  Job:  [id number]

## 2010-05-23 NOTE — Assessment & Plan Note (Signed)
[facility] HEALTHCARE                            CARDIOLOGY OFFICE NOTE   AKSHATH, MCCAREY                       MRN:          [id number]  DATE:02/25/2008                            DOB:          04/02/1922    [name] comes in today for followup.   His biggest complaint is hot flashes and melanotic.  He had these in the  remote past, but they have recurred.  They clearly sound like true hot  flashes!   He has had no syncope, presyncope, tachy palpitations, had no further  falls.   PROBLEM LIST INCLUDES:  1. Chronic atrial fibrillation.  He is in sinus rhythm and sinus brady      part of the time on amiodarone.  We have continued this drug for      this reason.  2. Anticoagulation.  3. History of chest discomfort with a negative Myoview on 2006 with      normal left ventricular function.  4. Mild aortic stenosis by 2-D echo.  His last 2-D echo was April 28, 2007 which was stable.  5. Hypertension.  6. History of lower extremity edema, responsive to Lasix.   His meds are unchanged since his last visit.  Please note this Pacerone  is 200 mg p.o. b.i.d.  His last blood work was 3 months ago was stable.   PHYSICAL EXAMINATION:  VITAL SIGNS:  His blood pressure is 130/70, his  pulse is 56, and he is in sinus brady.  EKG confirms.  His weight is 196  up 5.  HEENT:  Normal.  NECK:  Supple.  Carotid upstrokes were equal bilaterally with soft  referred sounds bilaterally.  Thyroid is not enlarged or tender.  Trachea is midline.  CHEST:  Lungs were clear to auscultation and percussion except for a few  dry crackles in the bases which are baseline.  HEART:  Reveals a normal PMI.  He has a systolic murmur consistent with  aortic stenosis.  S2 splits.  ABDOMEN:  Soft.  Good bowel sounds.  There is no midline or pulsatile  masses.  No hepatomegaly.  EXTREMITIES:  Reveal chronic brawny changes in the lower extremities  with hyperpigmentation and  diffuse ecchymoses that are old in his upper  extremities.  SKIN:  Really thin.  Pulses were present, but reduced.  NEURO:  Exam is intact.   ASSESSMENT/PLAN:  Mr. Hallahan is stable from my standpoint.  With his  bradycardia and remaining in sinus rhythm, I am going to cut his  Pacerone down to 200 mg per day.  In addition, I have talked to Dr. Shelby Dubin and we will try low dose Effexor XR generic 75 mg nightly per  month to see if that help his hot flashes.  I will see him back again in  3 months at which time he will need amiodarone blood work.     Thomas C. Daleen Squibb, MD, [facility]  Electronically Signed    TCW/MedQ  DD: 02/25/2008  DT: 02/25/2008  Job #: [id number]

## 2010-05-23 NOTE — Assessment & Plan Note (Signed)
[facility] HEALTHCARE                            CARDIOLOGY OFFICE NOTE   [name], [name]                       MRN:          [id number]  DATE:11/05/2007                            DOB:          August 29, 1922    [name] comes in today.  He has been having some chest tightness and  shortness of breath with exertion.  Last stress Myoview was in 2006 and  was negative for ischemia.  He has no known coronary disease, although  he probably has some based on his risk factors.   He has also been having some hot flashes out of the blue.  He has no  other associated symptoms.  Last time we checked his amiodarone, labs,  his TSH was little bit high, but his free T4 was normal.  We will repeat  that today.   His problem list is well outlined in the recent discharge summary as  well as April 15, 2007.   His meds today are outlined on the chart and are essentially unchanged  except I have him on 325 mg of aspirin per day rather than 81.   PHYSICAL EXAMINATION:  VITAL SIGNS:  His blood pressure is 124/62, his  pulse is 58 and regular, his weight is 191, up 4.  HEENT:  Baseline and unchanged.  Carotid upstrokes are equal bilaterally  without bruits, no thyromegaly.  No tenderness to his thyroid.  Trachea  is midline.  LUNGS:  Clear to auscultation and percussion.  HEART:  A poorly appreciated PMI, soft S1 and S2.  Systolic murmur, S2  split.  ABDOMINAL:  Soft, good bowel sounds.  No midline bruit.  EXTREMITIES:  No cyanosis, clubbing, or edema.  Pulses are intact.  NEURO:  Intact.   I am concerned [name] may have some obstructive coronary artery  disease.  We will arrange for him to have an adenosine rest-stress  Myoview.  In addition, we will check a free T4 and TSH.  Otherwise, I  will see him back again in 6 months.     Thomas C. Daleen Squibb, MD, [facility]  Electronically Signed    TCW/MedQ  DD: 11/05/2007  DT: 11/06/2007  Job #: [id number]

## 2010-05-23 NOTE — Letter (Signed)
May 23, 2006    Jesse Sans. Wall, MD, FACC  [address]  [address]  [place], [place] [address]   RE:  NAZAIAH, NAVARRETE  MRN:  [id number]  /  DOB:  1922-05-08   Dear Elijah Birk:   Would you please re-evaluate Vonna Kotyk as to the risk, benefit ratio of  continuing Coumadin.  Even minor trauma results in profound ecchymotic  lesions.  Minor abrasions result in chronic oozing of blood from the  wound site.   He has had a CVA and has paroxysmal atrial fibrillation for which he has  been hospitalized twice.  He has been evaluated by Dr. [name],  hematologist/oncologist, and found to have chronic leukopenia and  thrombocytopenia due to splenomegaly.  This was evaluated by a bone  marrow biopsy in 2000.   I would recommend periodic monitor by Dr. [name].   At this time, his white count is 3600 with a normal differential.  Platelet count is 73000.  On May 20, 2006, his INR was 3.1.  I have  asked him to hold the Coumadin until the abrasion has stopped bleeding  and a decent eschar can establish itself.   I do appreciate your evaluation recommendations.    Sincerely,      Titus Dubin. Alwyn Ren, MD,FACP,FCCP    [facility]/MedQ  DD: 05/23/2006  DT: 05/23/2006  Job #: [id number]

## 2010-05-23 NOTE — Assessment & Plan Note (Signed)
[facility] HEALTHCARE                            CARDIOLOGY OFFICE NOTE   TREMAINE, EARWOOD                       MRN:          [id number]  DATE:04/15/2007                            DOB:          1922/06/15    [name] comes in today for follow up.   PROBLEM LIST:  1. History of chronic atrial fibrillation.  Surprisingly, he is in      sinus bradycardia today.  He has no palpitations.  2. Anticoagulation.  3. History of chest discomfort.  He had a negative stress Myoview on      December 13, 2004 with normal LV function.  4. Mild aortic stenosis by 2-D echocardiogram in 2007.  He is due a      follow-up study.  5. Hypertension which was elevated on last visit.  6. History of lower extremity edema responsive to Lasix.   His biggest complaint today is that he just feels all hot and flushed  after he takes his niacin.  I have told him to stop it.  He is only on  500 mg over-the-counter.   Denies any orthopnea, PND, palpitations, syncope, presyncope.  His lower  extremity edema is better.   MEDICATIONS:  His medications are unchanged since last visit except that  his levothyroxine is now at 0.025 mg one a day.  His amiodarone is 200  mg b.i.d., aspirin 81 mg a day, potassium 20 mg a day, furosemide 60 mg  a day and Coumadin as directed.   PHYSICAL EXAMINATION:  VITAL SIGNS:  His blood pressure today is 139/71,  his pulse is 61.  He is in sinus rhythm.  His EKG is stable with a left  axis deviation.  His weight is 190, which is stable.  HEENT:  He is less flushed today.  Pupils are equal, round and reactive  to light and accommodation.  Extraocular movements intact.  Sclerae are  clear.  Face symmetry is normal.  NECK:  Supple.  Carotids upstrokes were equal bilaterally without  bruits.  No JVD.  Thyroid is not enlarged.  Trachea is midline.  LUNGS:  Clear.  HEART:  A systolic murmur consistent with AS, and it was difficult hear  S2 split.  ABDOMEN:   Protuberant with good bowel sounds.  There is no tenderness.  EXTREMITIES:  Only trace to 1+ edema which is markedly improved.  Pulses  are present but reduced.  His ecchymoses over his arms are remarkably  improved, as well.  NEUROLOGIC:  Intact.   ASSESSMENT AND PLAN:  [name] is probably have side effects from his  niacin.  I have told him to eliminate this.  I do not think it is worth  it.  We will check amiodarone labs today and  also set him up for a 2-D echocardiogram to follow up on his aortic  stenosis.  I will see him back in 3 months.     Thomas C. Daleen Squibb, MD, [facility]  Electronically Signed    TCW/MedQ  DD: 04/15/2007  DT: 04/15/2007  Job #: [id number]

## 2010-05-23 NOTE — Assessment & Plan Note (Signed)
[facility] HEALTHCARE                            CARDIOLOGY OFFICE NOTE   [name], [name]                       MRN:          [id number]  DATE:02/10/2007                            DOB:          1922-04-08    [name] returns today.   PROBLEM LIST:  1. Chronic atrial fibrillation.  He has had very little, if any,      palpitations since I last saw him in November.  2. Anticoagulation.  3. History of chest pain.  Negative stress Myoview, December 13, 2004,      with normal left ventricular function.  4. Mild aortic stenosis by 2-D echocardiogram in 2007.  5. Hypertension.  6. History of lower extremity edema responsive to Lasix.   He has just returned from a trip in [place].  He has all kinds of  pictures today of Tilapia that he caught!   He had blood work back in November which was stable.   Medications are unchanged.  He says sometimes he gets hot flashes.  This  is a new complaint.  It starts as wasted and goes all way to his face.  It may be the niacin.   PHYSICAL EXAMINATION:  VITAL SIGNS:  Blood pressure is 153/76, pulse 68  and regular, weight 190, up 6.  HEENT:  Unchanged.  NECK:  Supple.  Trachea is midline.  Thyroid is not enlarged or  palpable.  Carotids are full.  Soft systolic sounds.  LUNGS:  Clear except for a few crackles in the bases which is baseline.  HEART:  A nondisplaced PMI.  He has a systolic murmur consistent with  AS.  His S2 is hard to hear a split, but I think I do.  ABDOMEN:  Soft,  good bowel sounds.  No tenderness.  EXTREMITIES:  No edema.  Pulses intact.  NEUROLOGIC:  Intact.   ASSESSMENT:  [name] is stable.  Will see him back in April.  At  that time, he will need amiodarone labs and 2-D echocardiogram to follow  up on his aortic stenosis.     Thomas C. Daleen Squibb, MD, [facility]  Electronically Signed    TCW/MedQ  DD: 02/10/2007  DT: 02/11/2007  Job #: [id number]

## 2010-05-23 NOTE — Assessment & Plan Note (Signed)
[facility] HEALTHCARE                            CARDIOLOGY OFFICE NOTE   [name], BONES                       MRN:          [id number]  DATE:06/18/2006                            DOB:          [birth year]    [name] returns today for management of the following issues:  1. Chronic atrial fibrillation.  He has been bothered by intermittent      episodes of atrial fibrillation.  Since we saw him on May 24, 2006,      he has had none!  He continues on Pacerone 200 mg twice a day.  2. Chronic easy bruisability on Coumadin.  His INR was 2.5, and he is      due one today.  His bruising is improved.  3. History of chest pain.  Negative stress Myoview December 13, 2004.  4. Normal left ventricular systolic function.  5. Mild aortic stenosis.  6. Hypertension.  7. Gastroesophageal reflux.  8. History of lower extremity edema.   He is doing well today.  His biggest problem is chronic back pain, which  keeps him from sleeping.   His medications are unchanged since last visit.   His blood pressure today is 126/77.  His pulse 66 and regular.  His  weight is 183, down 3.  HEENT:  Unremarkable and unchanged.  Carotid upstrokes are equal bilaterally without bruits.  There is no  thyromegaly.  Trachea is midline.  LUNGS:  Clear except for a few crackles in the bases, which is baseline.  HEART:  Reveals a nondisplaced PMI.  He has a soft systolic murmur with  a split S2.  ABDOMINAL EXAM:  Soft.  EXTREMITIES:  Reveal minimal edema.  He has a lot of diffuse ecchymoses  and bruising of his upper extremities, which is baseline.   ASSESSMENT AND PLAN:  [name] is doing well.  He had a pro time  today in the Coumadin Clinic.  He is going to try to go to the [place] to get  some Pacerone, which will save him 70 dollars a month.  I will see him  back in September.     Thomas C. Daleen Squibb, MD, [facility]  Electronically Signed    TCW/MedQ  DD: 06/18/2006  DT: 06/18/2006  Job  #: [id number]

## 2010-05-23 NOTE — Assessment & Plan Note (Signed)
[facility] HEALTHCARE                            CARDIOLOGY OFFICE NOTE   [name], [name]                       MRN:          [id number]  DATE:05/24/2006                            DOB:          05/24/1922    PRIMARY CARDIOLOGIST:  [name].   PATIENT PROFILE:  An 77 year old married Caucasian male with a prior  history of a-fib who presents after having the recent laceration on his  forearm for discussion regarding Coumadin therapy.   PROBLEM LIST:  1. Chronic atrial fibrillation, on Pacerone and Coumadin.  2. Chronic easy bruisability, on Coumadin therapy.  3. History of chest pain, December 13, 2004, exercise Myoview, exercise      x5 minutes and 50 seconds, no ST or T changes.  Ejection fraction      of 71%, and there is no evidence of ischemia or scar.  4. Gastroesophageal reflux disease.  5. Hypertension.  6. History of lower extremity edema.   HISTORY OF PRESENT ILLNESS:  An 77 year old Caucasian male with the  above problem list who was last seen in clinic May 01, 2006.  From an  atrial fibrillation standpoint, he feels that he has been doing well  since his Pacerone was increased to 200 mg b.i.d.  He had an episode of  what he feels to be a-fib occurring last night lasting just 30 or 40  seconds, but prior to that he has not had anything in about a month.  Last Wednesday, he developed a laceration following bumping into a hook  on the door.  He was seen by [name].  It was felt that the wound was  healing well, but his Coumadin was discontinued.  The patient also had  some significant bruising on his left upper extremity after apparently  bumping something and then developing a small knot which he then pushed,  and this developed into a large bruise.  [name] presents today to  discuss whether or not he should continue his Coumadin therapy.  He has  not had any GI bleed, significant epistaxis or hemoptysis.   HOME  MEDICATIONS:  1. Niacin 500 mg daily.  2. Flax seed 1 daily.  3. Gingko daily.  4. Multivitamin 1 daily.  5. Coumadin, currently on hold.  6. Omeprazole 20 mg daily.  7. Primidone 50 mg b.i.d.  8. Allopurinol 300 mg daily.  9. Lasix 20 mg 3 tablets daily.  10.Potassium 20 mEq daily.  11.Levothyroxine 25 mcg daily.  12.Aspirin 81 mg daily.  13.Lyrica 50 mg b.i.d.  14.Pacerone 200 mg b.i.d.  15.Fish oil 1 daily.   PHYSICAL EXAMINATION:  Blood pressure 140/86, heart rate 65,  respirations 18.  He is 186 pounds.  Pleasant, white male in no acute distress.  Awake, alert, and oriented  x3.  NECK:  No bruits or JVD.  LUNGS:  Respiration is regular and unlabored, clear to auscultation.  CARDIAC:  Regular, S1, S2, he has a 2/6 systolic ejection murmur loudest  at the upper sternal borders but heard throughout.  ABDOMEN:  Soft, nontender, nondistended.  Bowel sounds present  x4.  EXTREMITIES:  Warm and dry and pink with significant ecchymoses of  bilateral forearms, left upper arm, along with a healing laceration of  the left forearm.  Dorsalis pedis, posterior tibial pulses are 1+ and  equal bilaterally.  There is no clubbing or edema.   ACCESSORY CLINICAL FINDINGS:  None.   ASSESSMENT AND PLAN:  1. Chronic atrial fibrillation.  The patient is on chronic Coumadin      therapy and this was recently placed on hold after obtaining a      laceration about a week ago.  The case has been discussed with Dr.      Daleen Walton who has also seen the patient.  Given the patient's paroxysmal      atrial fibrillation and age with risk of stroke approaching 10%, we      will plan to reinitiate Coumadin therapy at this time.  The patient      has not had any bleeding per se, but obviously bruises quite      easily.  We will discontinue his aspirin in hopes that this will      make him somewhat less sensitive to bruising.  We would also      recommend maintaining his INR right at 2.0, and thus he may get       away with a lower dose of Coumadin.  He will continue to be      followed in our Coumadin clinic.  2. Disposition.  The patient will follow up with [name] as      previously scheduled.      [name], ANP  Electronically Signed      [name]. Daleen Squibb, MD, [facility]  Electronically Signed   CB/MedQ  DD: 05/24/2006  DT: 05/24/2006  Job #: [id number]

## 2010-05-23 NOTE — Assessment & Plan Note (Signed)
[facility] HEALTHCARE                            CARDIOLOGY OFFICE NOTE   JAMESEN, STAHNKE                       MRN:          [id number]  DATE:09/12/2006                            DOB:          24-Mar-1922    Mr. Harshfield returns today for further management of the following  issues.  1. Chronic atrial fibrillation.  He has had no atrial fibrillation      since last visit June 18, 2006.  He remains on Pacerone 200 mg p.o.      b.i.d.  2. Anticoagulation with chronic bruisability.  3. History of chest pain, negative stress Myoview December 13, 2004.  4. Normal left ventricular systolic function.  5. Mild aortic stenosis.  6. Hypertension.  7. History of lower extremity edema responsive to Lasix.   He is having no complaints today.  He just got back from [place].  He  caught a bunch of [place] fish.   He walked 3 miles this morning without any symptoms.   He recently went to the [place] and they changed his dose of levothyroxine.  They suggested blood work in 3 months.   His meds are unchanged otherwise.   EXAM:  Blood pressure is 122/78, pulse 63 and regular, weight is 183.  HEENT:  Ruddy complexion, which is baseline.  The rest of his HEENT exam  is unremarkable.  Carotids are full without bruits.  No JVD.  Thyroid is not enlarged.  Trachea is midline.  LUNGS:  Clear.  HEART:  Reveals a regular rate and rhythm without gallop.  He has a  systolic murmur consistent with his aortic stenosis.  S2 does split.  ABDOMEN:  Soft with good bowel sounds.  No midline bruits.  EXTREMITIES:  With no cyanosis, clubbing, or edema today.  Pulses were  present.  NEURO:  Grossly intact.  He has chronic pigmentation changes of his skin  from chronic bruising from Coumadin.  These are unchanged.   His EKG shows normal sinus rhythm with a normal QTc, normal PR interval.   ASSESSMENT AND PLAN:  Mr. Dimock is doing well.  I have made no change  in his  program.  We will have him back in November, at which time we  will check blood work, including free T4 and TSH.    Thomas C. Daleen Squibb, MD, [facility]  Electronically Signed   TCW/MedQ  DD: 09/12/2006  DT: 09/12/2006  Job #: [id number]

## 2010-05-23 NOTE — Consult Note (Signed)
NAME:  [name], [name] NO.:  [id number]   MEDICAL RECORD NO.:  [id number]          PATIENT TYPE:  INP   LOCATION:  6706                         FACILITY:  [facility]   PHYSICIAN:  Pierce Crane, MD        DATE OF BIRTH:  Dec 08, 1922   DATE OF CONSULTATION:  DATE OF DISCHARGE:                                 CONSULTATION   This is an 77 year old gentleman who presents with anemia.   This gentleman has been in relatively good health.  He apparently had a  laceration on his left tibial area about a month ago and had sutures  placed and subsequently these were removed and he had developed  cellulitis as a result.  He also about a week ago fell through a  floorboard and developed hematoma around his right leg.  Of note, he is  on Coumadin and subsequently developed further large hematoma on the  right leg.  He was admitted because of the cellulitis on his leg.  He  has been on Coumadin for atrial fibrillation and his baseline hemoglobin  was 8.7.  His hemoglobin subsequently had fallen to 7.7.  He received 2  units of packed red blood cells.  Hemoglobin currently is 9.2.  Of note  is that he has a long-standing history of splenomegaly and chronic  thrombocytopenia.  He relates having had a bone marrow done once or  twice in the past.  It is not clear who did it and it is not clear with  the results.  He has been on long-term anticoagulation secondary to  atrial fibrillation and has continued throughout this course.  His other  medical problems include history of hypertension, history of GERD,  history of gout.  He has a history of colonic polyps, degenerative disk  disease,  history of throat cancer in 1990s.   CURRENT MEDICATIONS:  Omeprazole, Synthroid, furosemide, amiodarone,  allopurinol, Coumadin, potassium, and Inderal.   ALCOHOL OR TOBACCO USE:  None.   ALLERGIES:  None.   SOCIAL HISTORY:  He is married, 3 children, 4 grandchildren, retired  from the [employer].  He has been married for about 60 years.   REVIEW OF SYSTEMS:  Unremarkable apart from pain in his leg.  He denies  headache or blurred vision and he has not had any other areas of  bleeding.  Of note, he has had a CTA to rule out retroperitoneal  hematoma.   PHYSICAL EXAMINATION:  VITAL SIGNS:  Currently, his blood pressure is  117/53, pulse 66, respiratory 16, saturation 93%.  HEENT:  He has no palpable adenopathy in the head and neck area.  LUNGS:  Clear.  HEART SOUNDS:  Normal.  He has the spleen tip palpable on inspiration and he has no obvious  hepatomegaly.  He has no inguinal adenopathy.  He has ecchymoses on his  right upper thigh area as well as right shoulder.  His left tibial area  is bandaged.  There is a purpuric ecchymotic area extending below the  bandage, which may have resulted in cellulitis.  The area is  somewhat  fluctuant.   LABORATORY DATA:  His white count 2.7, hemoglobin 9.2, platelet count  47,000, and creatinine is 1.88.  LDH is normal.   ASSESSMENT/PLAN:  This gentleman has anemia.  In November 2008, there  was CBC on the chart, which showed his hemoglobin of 14.  I believe that  in point of fact, he likely has acute on chronic anemia.  Two to three  weeks ago he bled extensively.  We do not have any results of that CBC  at that time.  His hemoglobin is likely reflection of that as well as  whatever bleeding has gone on in the interim with hematoma in his right  leg.  In addition, he has some moderate renal insufficiency, which I  think is contributing to his anemia.  In addition, he has a history of  splenomegaly, chronic thrombocytopenia, and he also has leukopenia,  which was chronic as well.  I cannot find any result of his previous  bone marrow biopsies.  The splenomegaly was noted on a CT done in 2000,  so I suspect this is a longstanding history.  Possibility has been  raised that this may represent a proliferative myelofibrosis type   picture, in which case, he may have decreased ability to respond  appropriately to bleeding and his retic count will be important to look  at.   I think he needs his iron studies checked and he needs to have his  hemoglobin monitored.  Assuming he has a normal ferritin he may benefit  from Aranesp in the short term just to increase his hemoglobin,  especially given his renal insufficiency.   We will plan on follow him while in hospital.  We likely need to follow  up in the outpatient clinic as well.      Pierce Crane, MD  Electronically Signed     PR/MEDQ  D:  06/14/2007  T:  06/15/2007  Job:  [id number]   cc:   Darcey Nora, MD

## 2010-05-26 NOTE — Assessment & Plan Note (Signed)
[facility]                                 ON-CALL NOTE   [name], [name]                         MRN:          [id number]  DATE:02/15/2006                            DOB:          March 26, 1922    He is a patient of Dr. Vern Claude.  [name] called in this evening  because he was having irregular heartbeat which was faster at around 7  o'clock this evening, but he now says the heart rate is 76 with some  skipped beats.  Looking at his history, he has a history of paroxysmal  atrial fibrillation and is currently on amiodarone 200 mg b.i.d. as well  as Coumadin therapy.  It sounds as though he is well rate-controlled at  the present, and he thinks that he may have come out of a-fib or is on  the way out of it.  He denies any chest pain or shortness of breath at  this point.  I advised that if he is currently comfortable he can remain  at home, as he is already on amiodarone and Coumadin.  However, if he  becomes more symptomatic then he should come into the emergency room for  additional evaluation and possibly IV diltiazem.  The patient said he is  feeling okay right now and will plan to stay at home and see how he  feels in the morning. He does have followup with Dr. Daleen Squibb on Wednesday.     [name], ANP  Electronically Signed    CB/MedQ  DD: 02/15/2006  DT: 02/16/2006  Job #: [id number]

## 2010-05-26 NOTE — H&P (Signed)
NAME:  Walter, Walton NO.:  [id number]   MEDICAL RECORD NO.:  [id number]                   PATIENT TYPE:  INP   LOCATION:  4732                                 FACILITY:  [facility]   PHYSICIAN:  Titus Dubin. Alwyn Ren, M.D. [facility]         DATE OF BIRTH:  December 14, 1922   DATE OF ADMISSION:  09/10/2003  DATE OF DISCHARGE:                                HISTORY & PHYSICAL   HISTORY OF PRESENT ILLNESS:  [name] is a 77 year old white male  admitted with herpes zoster with radicular pain, with cellulitis in the  right lower extremity.   He was seen September 03, 2003, acutely, having awakened with pain in the left  lower extremity involving the ankle, knee and hip.  He was having difficulty  ambulating.  Upon awakening, he noted pain in the right ankle approximately  3 a.m.  Through the day, it radiated into the right popliteal area and then  the hip.  The pain is variable, ranging from dull to sharp, but constant at  the ankle.  It would throb as he would hang the ankle down.  Significantly,  he has a history of essential tremor and peripheral neuropathy, and is  followed by [facility].   On exam at that time, he was afebrile.  Homans sign was negative.  Pedal  pulses were decreased.  He had trace edema.  He had some pain with rotation  of the right hip.  There was mild erythema over the feet.  He was  exquisitely tender to touch or palpation over the right medial malleolus.  He has had a history of hyperuricemia and gout, and for that reason, he has  not been treated with hydrochlorothiazide in the past.  He was placed on  prednisone 20 mg 1 b.i.d. for 5 days and then 1 daily.  He was placed on  colchicine 0.6 mg every 1 hour until symptoms were relieved or he had nausea  or vomiting.  He has esophageal reflux and was told to avoid any  nonsteroidals, which would also possibly exacerbate any gout.   CBC and differential revealed a white count of  3700 with hematocrit of 36.2.  Uric acid was normal at 6.   He was seen acutely on September 10, 2003 with recurrence of the pain.  On  September 03, 2003, he had taken the prednisone and colchicine and made the  comment, I thought I was home-free, i.e., that this his pain in the ankle,  knee and hip had resolved.   Approximately 10 p.m. on the evening of September 04, 2003, he had nausea and  vomiting on 3 occasions, and this persisted until 2 a.m.  On September 05, 2003, he felt well enough to increase activities.  On September 09, 2003, the  pain returned acutely after he had been picking apples and peas.   He awoke the morning of September 10, 2003  with some chest pressure which was  better with burping.  He had no exertional angina.   The pain now persists at the right ankle and anteroinferior shin.  He has  had redness over the right calf as of September 10, 2003.   Neurontin, which had been called in September 09, 2003, 100 mg to 200 mg  every 8 hours had been of some benefit.   At this visit, a herpetic-type rash was noted over the lumbar spine.  He  stated this had occurred after the onset of the leg pain and he did not make  a connection.  It was not present on September 03, 2003 when seen.  There was a  suggestion of erythema over the right calf, suggesting cellulitis.  Homans  sign again was negative to equivocal.   He was admitted because of his pain which may be herpetic in nature, but  there is a question of possible cellulitis and possible deep venous  thrombosis.   PAST MEDICAL HISTORY:  His past medical history is long and complicated.  He  has been hospitalized on at least 2 occasions in paroxysmal atrial  fibrillation.  He has also had a CVA.  In 1998, he had visual scotoma  related to either small stroke or optic nerve or parietal cortex.  He has  been on anticoagulants because of the atrial fibrillation.  He has had vocal  cord cancer as well as cholecystectomy.  He has  also had a colonoscopy which  revealed diverticulosis and polyps.  He has hypertension and reflux.  He has  been noted to have leukopenia and thrombocytopenia in the context of the  splenomegaly.  He was felt to have acute gout in February of 2004.   FAMILY HISTORY:  Family history is positive for stroke in his daughter, lung  cancer in his mother, colon cancer in a sister.   SOCIAL HISTORY:  He has not smoked since 1964.  His alcohol intake has been  minimal.   ALLERGIES:  He is allergic to PENICILLIN, NEOSPORIN and SEPTRA.   PRESENT MEDICATIONS:  Present medications include:  1.  Multivitamins and supplements including ginkgo biloba extract.  2.  Metamucil.  3.  Omeprazole 20 mg daily.  4.  Chlordiazepoxide 10 mg daily.  5.  Allopurinol 300 mg daily.  6.  Coumadin in varying doses.  7.  Sotalol 80 mg b.i.d.  8.  Niaspan 500 mg at bedtime.  9.  Furosemide 20 mg 3 daily.  10. Lisinopril 40 mg daily.  11. Aspirin 81 mg daily.  12. Primidone 50 mg __________.   REVIEW OF SYSTEMS:  Review of systems is outlined above.  He has had some  heartburn, as noted, with chest pressure.  On the evening of September 09, 2003, he had chills.  He denies any fever or purulent secretions from the  head or chest.  He has had no other GI symptoms.   PHYSICAL EXAMINATION:  GENERAL:  On exam, he appears somewhat uncomfortable.  VITAL SIGNS:  Temperature is 99.3, weight 190, pulse of 92 and regular,  respiratory rate 16 and blood pressure 90/60.  HEENT:  Pattern alopecia is present.  He has arteriolar narrowing.  There is  decreased visualization of the fundus on the right.  He has an upper  denture; the remainder of the otolaryngologic exam is unremarkable except  for some decreased light reflex of the tympanic membranes.  NECK:  Thyroid is normal to palpation.  CHEST:  Chest is clear with no rubs or splinting. CARDIAC:  He has grade 1/2 to 1 systolic murmur with a regular rhythm.  EXTREMITIES:   Trace edema is noted.  Homans sign is equivocal.  ABDOMEN:  He has no lymphadenopathy or organomegaly.  There is a bruise  noted over the right upper quadrant and an operative scar in the right upper  quadrant.  SKIN:  The herpetic rash almost appears to mimic hemangioma as it is reddish  in color.  Erythema is noted over the right calf.   ASSESSMENT AND PLAN:  He is now admitted with right upper quadrant pain and  erythema suggestive of possible cellulitis and possible deep venous  thrombosis.  Additional considerations would be osteomyelitis, in view of  the severe pain, exquisite pain to palpation.   He has herpes zoster over the lumbar area which may be playing a role in  this pain.   He has a penicillin allergy and therefore clindamycin and gentamicin will be  used.  He will be placed on Lovenox and the Coumadin held.  Cardiac enzymes  will also be checked because of the chest pressure he mentions.                                                Titus Dubin. Alwyn Ren, M.D. [facility]    [facility]/MEDQ  D:  09/11/2003  T:  09/11/2003  Job:  [id number]   cc:   [name] L. Thad Ranger, M.D.  [address]  [address]  [place]  [place] [address]  Fax: [phone]

## 2010-06-16 ENCOUNTER — Emergency Department (HOSPITAL_COMMUNITY)
Admission: EM | Admit: 2010-06-16 | Discharge: 2010-06-16 | Disposition: A | Discharge status: Home / Self Care | Payer: Medicare Other | Attending: Emergency Medicine | Admitting: Emergency Medicine

## 2010-06-16 DIAGNOSIS — S61209A Unspecified open wound of unspecified finger without damage to nail, initial encounter: Secondary | ICD-10-CM | POA: Insufficient documentation

## 2010-06-16 DIAGNOSIS — Y93H2 Activity, gardening and landscaping: Secondary | ICD-10-CM | POA: Insufficient documentation

## 2010-06-16 DIAGNOSIS — I2510 Atherosclerotic heart disease of native coronary artery without angina pectoris: Secondary | ICD-10-CM | POA: Insufficient documentation

## 2010-06-16 DIAGNOSIS — Z85819 Personal history of malignant neoplasm of unspecified site of lip, oral cavity, and pharynx: Secondary | ICD-10-CM | POA: Insufficient documentation

## 2010-06-16 DIAGNOSIS — Z79899 Other long term (current) drug therapy: Secondary | ICD-10-CM | POA: Insufficient documentation

## 2010-06-16 DIAGNOSIS — I10 Essential (primary) hypertension: Secondary | ICD-10-CM | POA: Insufficient documentation

## 2010-06-16 DIAGNOSIS — E039 Hypothyroidism, unspecified: Secondary | ICD-10-CM | POA: Insufficient documentation

## 2010-06-16 DIAGNOSIS — W268XXA Contact with other sharp object(s), not elsewhere classified, initial encounter: Secondary | ICD-10-CM | POA: Insufficient documentation

## 2010-06-28 ENCOUNTER — Emergency Department (HOSPITAL_COMMUNITY)
Admission: EM | Admit: 2010-06-28 | Discharge: 2010-06-29 | Disposition: A | Discharge status: Home / Self Care | Payer: Medicare Other | Attending: Emergency Medicine | Admitting: Emergency Medicine

## 2010-06-28 DIAGNOSIS — Z85819 Personal history of malignant neoplasm of unspecified site of lip, oral cavity, and pharynx: Secondary | ICD-10-CM | POA: Insufficient documentation

## 2010-06-28 DIAGNOSIS — Y92009 Unspecified place in unspecified non-institutional (private) residence as the place of occurrence of the external cause: Secondary | ICD-10-CM | POA: Insufficient documentation

## 2010-06-28 DIAGNOSIS — M79609 Pain in unspecified limb: Secondary | ICD-10-CM | POA: Insufficient documentation

## 2010-06-28 DIAGNOSIS — I2510 Atherosclerotic heart disease of native coronary artery without angina pectoris: Secondary | ICD-10-CM | POA: Insufficient documentation

## 2010-06-28 DIAGNOSIS — I10 Essential (primary) hypertension: Secondary | ICD-10-CM | POA: Insufficient documentation

## 2010-06-28 DIAGNOSIS — S61209A Unspecified open wound of unspecified finger without damage to nail, initial encounter: Secondary | ICD-10-CM | POA: Insufficient documentation

## 2010-06-28 DIAGNOSIS — Z79899 Other long term (current) drug therapy: Secondary | ICD-10-CM | POA: Insufficient documentation

## 2010-06-28 DIAGNOSIS — W260XXA Contact with knife, initial encounter: Secondary | ICD-10-CM | POA: Insufficient documentation

## 2010-06-28 DIAGNOSIS — Z7982 Long term (current) use of aspirin: Secondary | ICD-10-CM | POA: Insufficient documentation

## 2010-06-28 DIAGNOSIS — E039 Hypothyroidism, unspecified: Secondary | ICD-10-CM | POA: Insufficient documentation

## 2010-07-27 ENCOUNTER — Encounter: Payer: Self-pay | Admitting: Cardiology

## 2010-08-30 ENCOUNTER — Encounter: Payer: Self-pay | Admitting: Cardiology

## 2010-08-30 ENCOUNTER — Ambulatory Visit (INDEPENDENT_AMBULATORY_CARE_PROVIDER_SITE_OTHER): Payer: Medicare Other | Admitting: Cardiology

## 2010-08-30 DIAGNOSIS — I4891 Unspecified atrial fibrillation: Secondary | ICD-10-CM

## 2010-08-30 DIAGNOSIS — I2510 Atherosclerotic heart disease of native coronary artery without angina pectoris: Secondary | ICD-10-CM

## 2010-08-30 DIAGNOSIS — I359 Nonrheumatic aortic valve disorder, unspecified: Secondary | ICD-10-CM

## 2010-08-30 DIAGNOSIS — R609 Edema, unspecified: Secondary | ICD-10-CM

## 2010-08-30 DIAGNOSIS — I498 Other specified cardiac arrhythmias: Secondary | ICD-10-CM

## 2010-08-30 DIAGNOSIS — Z79899 Other long term (current) drug therapy: Secondary | ICD-10-CM

## 2010-08-30 DIAGNOSIS — I6350 Cerebral infarction due to unspecified occlusion or stenosis of unspecified cerebral artery: Secondary | ICD-10-CM

## 2010-08-30 LAB — BASIC METABOLIC PANEL
BUN: 26 mg/dL — ABNORMAL HIGH (ref 6–23)
Creatinine, Ser: 1.4 mg/dL (ref 0.4–1.5)
GFR: 51.87 mL/min — ABNORMAL LOW (ref >60.00)
Potassium: 4.3 mEq/L (ref 3.5–5.1)

## 2010-08-30 LAB — T4, FREE: Free T4: 1.06 ng/dL (ref 0.60–1.60)

## 2010-08-30 LAB — HEPATIC FUNCTION PANEL
Albumin: 4.4 g/dL (ref 3.5–5.2)
Total Bilirubin: 0.8 mg/dL (ref 0.3–1.2)

## 2010-08-30 NOTE — Patient Instructions (Signed)
Please have lab work today to monitor the effects of Amiodarone.  The current medical regimen is effective;  continue present plan and medications.  Follow up in 6 months with Dr Daleen Squibb.  You will receive a letter in the mail 2 months before you are due.  Please call us when you receive this letter to schedule your follow up appointment.

## 2010-08-30 NOTE — Progress Notes (Signed)
HPI [name] returns today for his multiple cardiac issues. He has had one episode of brief palpitations otherwise asymptomatic. He denies any syncope, angina or change in his chronic dyspnea on exertion. His edema has been stable. No problems with Coumadin.  Meds reviewed. Complex regimen but he is compliant. Wife assures me of this.  His electrocardiogram shows sinus bradycardia with first-degree block. It is notable for LVH. Past Medical History  Diagnosis Date  . Anticoagulant long-term use   . Stroke   . Atrial fibrillation   . CAD (coronary artery disease)   . Bradycardia   . Aortic stenosis   . Hypertension   . Diabetes mellitus   . Anemia   . Action tremor   . Thrombocytopenia   . Throat cancer   . Colon polyp   . Anxiety   . Splenomegaly   . Gout   . GERD (gastroesophageal reflux disease)   . Thyroid disease   . Leukopenia   . Degenerative disc disease   . Edema   . Abrasion or friction burn of other, multiple, and unspecified sites, without mention of infection   . Joint effusion, knee     left knee  . Multiple and unspecified open wound of lower limb, complicated   . Pain in limb   . Cellulitis and abscess of unspecified site   . Synovial cyst of popliteal space     Past Surgical History  Procedure Date  . Cholecystectomy   . Joint effusion     left knee    Family History  Problem Relation Age of Onset  . Colon cancer    . Stroke    . Cancer Other     colon  . Stroke Other     History   Social History  . Marital Status: Married    Spouse Name: N/A    Number of Children: N/A  . Years of Education: N/A   Occupational History  . Not on file.   Social History Main Topics  . Smoking status: Former Smoker    Quit date: 01/11/1962  . Smokeless tobacco: Not on file  . Alcohol Use: No  . Drug Use: Not on file  . Sexually Active: Not on file   Other Topics Concern  . Not on file   Social History Narrative  . No narrative on file     Allergies  Allergen Reactions  . Penicillins   . Triple Antibiotic     Current Outpatient Prescriptions  Medication Sig Dispense Refill  . allopurinol (ZYLOPRIM) 100 MG tablet Take 100 mg by mouth daily.        [name] Kitchen amiodarone (PACERONE) 200 MG tablet Take 200 mg by mouth daily.        . Ascorbic Acid (VITAMIN C) 500 MG tablet Take 500 mg by mouth daily.        [name] Kitchen aspirin 325 MG tablet Take 325 mg by mouth daily.        . Cholecalciferol (VITAMIN D) 1000 UNITS capsule Take 1,000 Units by mouth daily.        . Cinnamon 500 MG TABS Take 1,000 mg by mouth 2 (two) times daily.       . colchicine 0.6 MG tablet Take 0.6 mg by mouth 2 (two) times daily.        . fish oil-omega-3 fatty acids 1000 MG capsule Take 1 g by mouth daily.        . Flaxseed, Linseed, (FLAX SEED OIL) 1000  MG CAPS Take by mouth daily.        . furosemide (LASIX) 20 MG tablet Take 20 mg by mouth 3 (three) times daily.        [name] Kitchen levothyroxine (SYNTHROID, LEVOTHROID) 50 MCG tablet Take 50 mcg by mouth as directed. Take 1 tab daily EXCEPT M,W,F, Sun take 1.5 tabs       . losartan (COZAAR) 100 MG tablet Take 100 mg by mouth daily.        . Multiple Vitamin (MULTIVITAMIN) tablet Take 1 tablet by mouth daily.        [name] Kitchen omeprazole (PRILOSEC) 20 MG capsule Take 20 mg by mouth daily.        . potassium chloride SA (K-DUR,KLOR-CON) 20 MEQ tablet Take 20 mEq by mouth daily.        . primidone (MYSOLINE) 50 MG tablet Take 100 mg by mouth 2 (two) times daily.        . propranolol (INDERAL) 20 MG tablet Take 30 mg by mouth 2 (two) times daily.          ROS Negative other than HPI.   PE General Appearance: well developed, well nourished in no acute distress HEENT: symmetrical face, PERRLA, good dentition  Neck: no JVD, thyromegaly, or adenopathy, trachea midline Chest: symmetric without deformity Cardiac: PMI non-displaced, RRR, normal S1, S2, no gallop, aortic stenosis murmur unchanged, S2 splits. Lung: clear to ausculation and  percussion Vascular: all pulses full without bruits  Abdominal: nondistended, nontender, good bowel sounds, no HSM, no bruits Extremities: no cyanosis, clubbing , 1+ edema of the lower extremities, no sign of DVT, no varicosities  Skin: normal color, no rashes, lots of bruising, baseline Neuro: alert and oriented x 3, non-focal Pysch: normal affect Filed Vitals:   08/30/10 1017  BP: 146/68  Pulse: 55  Height: 5\' 10"  (1.778 m)  Weight: 194 lb (87.998 kg)    EKG  Labs and Studies Reviewed.   Lab Results  Component Value Date   WBC 4.5 12/06/2009   HGB 12.3* 05/18/2010   HGB 12.3* 05/18/2010   HCT 35.7* 05/18/2010   HCT 35.7* 05/18/2010   MCV 90.3 05/18/2010   MCV 90.3 05/18/2010   PLT 59* 05/18/2010   PLT 59* 05/18/2010      Chemistry      Component Value Date/Time   NA 135 04/20/2010 0958   K 4.7 04/20/2010 0958   CL 99 04/20/2010 0958   CO2 27 04/20/2010 0958   BUN 38* 04/20/2010 0958   CREATININE 1.5 04/20/2010 0958      Component Value Date/Time   CALCIUM 9.0 04/20/2010 0958   ALKPHOS 68 02/24/2010 1257   AST 23 02/24/2010 1257   ALT 25 02/24/2010 1257   BILITOT 0.7 02/24/2010 1257       No results found for this basename: CHOL   No results found for this basename: HDL   No results found for this basename: LDLCALC   No results found for this basename: TRIG   No results found for this basename: CHOLHDL   Lab Results  Component Value Date   HGBA1C 6.0 02/24/2010   Lab Results  Component Value Date   ALT 25 02/24/2010   AST 23 02/24/2010   ALKPHOS 68 02/24/2010   BILITOT 0.7 02/24/2010   Lab Results  Component Value Date   TSH 4.88 02/24/2010

## 2010-08-30 NOTE — Assessment & Plan Note (Signed)
Stable. Continue current medical therapy. 

## 2010-08-30 NOTE — Assessment & Plan Note (Signed)
Stable by history and exam. Echo not indicated.

## 2010-08-30 NOTE — Assessment & Plan Note (Signed)
Asymptomatic and stable. No change in treatment.

## 2010-08-30 NOTE — Assessment & Plan Note (Signed)
Doing well on amiodarone. Check labs today.

## 2010-10-04 LAB — PROTIME-INR
INR: 1.5
Prothrombin Time: 18.5 — ABNORMAL HIGH

## 2010-10-05 LAB — BASIC METABOLIC PANEL
BUN: 18
BUN: 27 — ABNORMAL HIGH
BUN: 32 — ABNORMAL HIGH
BUN: 35 — ABNORMAL HIGH
CO2: 20
CO2: 22
Calcium: 8.4
Chloride: 108
Chloride: 113 — ABNORMAL HIGH
Creatinine, Ser: 1.88 — ABNORMAL HIGH
Creatinine, Ser: 1.95 — ABNORMAL HIGH
GFR calc non Af Amer: 33 — ABNORMAL LOW
Glucose, Bld: 122 — ABNORMAL HIGH
Glucose, Bld: 129 — ABNORMAL HIGH
Glucose, Bld: 141 — ABNORMAL HIGH
Glucose, Bld: 192 — ABNORMAL HIGH
Potassium: 4.1
Potassium: 4.4
Sodium: 139
Sodium: 142

## 2010-10-05 LAB — CBC
HCT: 22.6 — ABNORMAL LOW
HCT: 25 — ABNORMAL LOW
HCT: 26 — ABNORMAL LOW
HCT: 26.3 — ABNORMAL LOW
HCT: 26.4 — ABNORMAL LOW
HCT: 29.3 — ABNORMAL LOW
Hemoglobin: 10 — ABNORMAL LOW
Hemoglobin: 8.9 — ABNORMAL LOW
MCHC: 34
MCHC: 34.2
MCHC: 34.3
MCHC: 34.5
MCHC: 35
MCV: 85.4
MCV: 85.6
MCV: 86
MCV: 86
MCV: 86.3
Platelets: 43 — CL
Platelets: 47 — CL
Platelets: 50 — ABNORMAL LOW
Platelets: 50 — ABNORMAL LOW
Platelets: 65 — ABNORMAL LOW
Platelets: 81 — ABNORMAL LOW
RBC: 3.09 — ABNORMAL LOW
RBC: 3.47 — ABNORMAL LOW
RDW: 15.7 — ABNORMAL HIGH
RDW: 15.7 — ABNORMAL HIGH
RDW: 15.8 — ABNORMAL HIGH
RDW: 15.9 — ABNORMAL HIGH
RDW: 16.1 — ABNORMAL HIGH
RDW: 16.2 — ABNORMAL HIGH
WBC: 1.7 — ABNORMAL LOW
WBC: 2.7 — ABNORMAL LOW
WBC: 3 — ABNORMAL LOW

## 2010-10-05 LAB — CULTURE, BLOOD (ROUTINE X 2)
Culture: NO GROWTH
Culture: NO GROWTH

## 2010-10-05 LAB — PROTIME-INR
INR: 1.5
INR: 1.9 — ABNORMAL HIGH
INR: 2 — ABNORMAL HIGH
INR: 2.3 — ABNORMAL HIGH
Prothrombin Time: 21.2 — ABNORMAL HIGH
Prothrombin Time: 22.5 — ABNORMAL HIGH
Prothrombin Time: 23.5 — ABNORMAL HIGH
Prothrombin Time: 24.1 — ABNORMAL HIGH

## 2010-10-05 LAB — URINALYSIS, ROUTINE W REFLEX MICROSCOPIC
Bilirubin Urine: NEGATIVE
Hgb urine dipstick: NEGATIVE
Specific Gravity, Urine: 1.019
pH: 5.5

## 2010-10-05 LAB — URINE CULTURE

## 2010-10-05 LAB — DIFFERENTIAL
Basophils Absolute: 0
Basophils Absolute: 0
Basophils Absolute: 0
Basophils Relative: 0
Basophils Relative: 0
Eosinophils Absolute: 0
Eosinophils Absolute: 0
Eosinophils Absolute: 0
Eosinophils Relative: 1
Eosinophils Relative: 1
Lymphocytes Relative: 18
Monocytes Absolute: 0.1
Monocytes Absolute: 0.1
Monocytes Relative: 6
Neutrophils Relative %: 83 — ABNORMAL HIGH

## 2010-10-05 LAB — HEPATIC FUNCTION PANEL
ALT: 49
AST: 41 — ABNORMAL HIGH
Total Protein: 5.7 — ABNORMAL LOW

## 2010-10-05 LAB — PREPARE FRESH FROZEN PLASMA

## 2010-10-05 LAB — CROSSMATCH: Antibody Screen: NEGATIVE

## 2010-10-05 LAB — IRON AND TIBC
Iron: 26 — ABNORMAL LOW
UIBC: 166

## 2010-10-05 LAB — ABO/RH: ABO/RH(D): A POS

## 2010-10-05 LAB — FERRITIN: Ferritin: 351 — ABNORMAL HIGH (ref 22–322)

## 2010-10-05 LAB — URINE MICROSCOPIC-ADD ON

## 2010-10-05 LAB — RETICULOCYTES
RBC.: 3.15 — ABNORMAL LOW
Retic Ct Pct: 0.8

## 2010-10-05 LAB — LACTATE DEHYDROGENASE: LDH: 115

## 2010-10-18 ENCOUNTER — Encounter: Payer: Self-pay | Admitting: Internal Medicine

## 2010-10-18 ENCOUNTER — Encounter: Payer: Self-pay | Admitting: *Deleted

## 2010-10-18 ENCOUNTER — Ambulatory Visit (INDEPENDENT_AMBULATORY_CARE_PROVIDER_SITE_OTHER): Payer: Medicare Other | Admitting: Internal Medicine

## 2010-10-18 VITALS — BP 132/78 | HR 53 | Temp 98.0°F | Wt 197.6 lb

## 2010-10-18 DIAGNOSIS — R079 Chest pain, unspecified: Secondary | ICD-10-CM

## 2010-10-18 DIAGNOSIS — I2510 Atherosclerotic heart disease of native coronary artery without angina pectoris: Secondary | ICD-10-CM

## 2010-10-18 DIAGNOSIS — C148 Malignant neoplasm of overlapping sites of lip, oral cavity and pharynx: Secondary | ICD-10-CM

## 2010-10-18 DIAGNOSIS — I10 Essential (primary) hypertension: Secondary | ICD-10-CM

## 2010-10-18 DIAGNOSIS — Z23 Encounter for immunization: Secondary | ICD-10-CM

## 2010-10-18 DIAGNOSIS — E119 Type 2 diabetes mellitus without complications: Secondary | ICD-10-CM

## 2010-10-18 DIAGNOSIS — D649 Anemia, unspecified: Secondary | ICD-10-CM

## 2010-10-18 DIAGNOSIS — I498 Other specified cardiac arrhythmias: Secondary | ICD-10-CM

## 2010-10-18 LAB — CBC WITH DIFFERENTIAL/PLATELET
Eosinophils Relative: 1.3 % (ref 0.0–5.0)
HCT: 34.2 % — ABNORMAL LOW (ref 39.0–52.0)
Hemoglobin: 11.6 g/dL — ABNORMAL LOW (ref 13.0–17.0)
Lymphs Abs: 0.7 10*3/uL (ref 0.7–4.0)
Monocytes Relative: 7.5 % (ref 3.0–12.0)
Neutro Abs: 2.2 10*3/uL (ref 1.4–7.7)
WBC: 3.2 10*3/uL — ABNORMAL LOW (ref 4.5–10.5)

## 2010-10-18 LAB — TROPONIN I: Troponin I: <0.01 ng/mL (ref <0.06)

## 2010-10-18 LAB — BASIC METABOLIC PANEL
CO2: 28 mEq/L (ref 19–32)
Calcium: 8.8 mg/dL (ref 8.4–10.5)
Creatinine, Ser: 1.6 mg/dL — ABNORMAL HIGH (ref 0.4–1.5)
GFR: 43.72 mL/min — ABNORMAL LOW (ref >60.00)
Glucose, Bld: 94 mg/dL (ref 70–99)

## 2010-10-18 LAB — D-DIMER, QUANTITATIVE: D-Dimer, Quant: 0.3 ug/mL-FEU (ref 0.00–0.48)

## 2010-10-18 MED ORDER — NITROGLYCERIN 0.3 MG SL SUBL: 0.3000 mg | SUBLINGUAL_TABLET | SUBLINGUAL | Status: DC | PRN

## 2010-10-18 NOTE — Progress Notes (Signed)
Subjective:    Patient ID: [name], male    DOB: September 22, 1922, 77 y.o.   MRN: [id number]  HPI  HYPERTENSION: exacerbation  Disease Monitoring  Blood pressure up > 4 weeks ; up to 174/74  Chest pain: yes, intermittently  for 2 weeks   Dyspnea: yes, DOE walking up incline , not PNDyspnea   Claudication: no   Medication compliance: no  Medication Side Effects  Lightheadedness: no,    Urinary frequency: no   Edema: yes, chronic    Preventitive Healthcare:               Diet Pattern: no plan  Salt Restriction: no added salt   CHEST PAIN: Location: LSB  Quality: dull  Duration: several minutes   Onset (rest, exertion): worse with exertion as noted  Radiation: no  Better with: rest  Symptoms Nausea/vomiting: no  Diaphoresis: no   Pleuritic: no  Dizziness: no  Palpitations: no  Syncope: no  Indigestion: no   Red Flags Worse with exertion: yes  Recent Immobility: no   Tearing/radiation to back: no      Review of Systems     Objective:   Physical Exam Gen.: Healthy and well-nourished in appearance. Alert, appropriate and cooperative throughout exam,but taciturn & man of few words   Eyes: No corneal or conjunctival inflammation noted. No icterus. Mouth: Oral mucosa and oropharynx reveal no lesions or exudates. Lower  teeth in good repair; upper plate. Neck: No deformities, masses, or tenderness noted.  Thyroid normal. Lungs: Normal respiratory effort; chest expands symmetrically. Lungs are clear to auscultation without rales, wheezes, or increased work of breathing. Heart: Normal rate and rhythm. Normal S1 and S2. No gallop, click, or rub. Grade 2 -2.5  holosystolic  Murmur; radiation into carotids & to LAAL. Abdomen: Bowel sounds normal; abdomen soft and nontender. No masses, organomegaly. Large ventral  hernia noted.    .                                                                                   Musculoskeletal/extremities:  No clubbing, cyanosis noted.  Tone & strength  normal.Joints : minor OA DIP changes. Nail health  Good;nails  stained . Homan's negative. 1+ edema of legs Vascular: Carotid, radial artery, dorsalis pedis and  posterior tibial pulses are full and equal.  Neurologic: Alert and oriented x3. Deep tendon reflexes symmetrical and normal.          Skin: Intact without suspicious lesions or rashes.Extensive arm bruising with chronic hyperpigmentation Lymph: No cervical, axillary  lymphadenopathy present. Psych: Mood and affect are normal. Normally interactive                                                                                         Assessment & Plan:  #1 hypertension, labile  #2  chest pain  Plan: See orders and recommendations

## 2010-10-18 NOTE — Patient Instructions (Signed)
.  If symptoms persist or progress go to ER

## 2010-10-19 ENCOUNTER — Encounter: Payer: Self-pay | Admitting: Physician Assistant

## 2010-10-19 ENCOUNTER — Inpatient Hospital Stay (HOSPITAL_COMMUNITY)
Admission: EM | Admit: 2010-10-19 | Discharge: 2010-10-24 | DRG: 603 | Disposition: A | Discharge status: Home / Self Care | Payer: Medicare Other | Attending: Internal Medicine | Admitting: Internal Medicine

## 2010-10-19 ENCOUNTER — Ambulatory Visit (INDEPENDENT_AMBULATORY_CARE_PROVIDER_SITE_OTHER): Payer: Medicare Other | Admitting: Physician Assistant

## 2010-10-19 VITALS — BP 160/70 | HR 54 | Resp 18 | Ht 70.0 in | Wt 199.4 lb

## 2010-10-19 DIAGNOSIS — Z7982 Long term (current) use of aspirin: Secondary | ICD-10-CM

## 2010-10-19 DIAGNOSIS — N183 Chronic kidney disease, stage 3 unspecified: Secondary | ICD-10-CM | POA: Diagnosis present

## 2010-10-19 DIAGNOSIS — I658 Occlusion and stenosis of other precerebral arteries: Secondary | ICD-10-CM | POA: Diagnosis present

## 2010-10-19 DIAGNOSIS — I359 Nonrheumatic aortic valve disorder, unspecified: Secondary | ICD-10-CM

## 2010-10-19 DIAGNOSIS — I4891 Unspecified atrial fibrillation: Secondary | ICD-10-CM

## 2010-10-19 DIAGNOSIS — E039 Hypothyroidism, unspecified: Secondary | ICD-10-CM | POA: Diagnosis present

## 2010-10-19 DIAGNOSIS — Z860100 Personal history of colon polyps, unspecified: Secondary | ICD-10-CM

## 2010-10-19 DIAGNOSIS — K219 Gastro-esophageal reflux disease without esophagitis: Secondary | ICD-10-CM | POA: Diagnosis present

## 2010-10-19 DIAGNOSIS — R079 Chest pain, unspecified: Secondary | ICD-10-CM | POA: Diagnosis present

## 2010-10-19 DIAGNOSIS — I6529 Occlusion and stenosis of unspecified carotid artery: Secondary | ICD-10-CM | POA: Diagnosis present

## 2010-10-19 DIAGNOSIS — Z79899 Other long term (current) drug therapy: Secondary | ICD-10-CM

## 2010-10-19 DIAGNOSIS — L02419 Cutaneous abscess of limb, unspecified: Principal | ICD-10-CM | POA: Diagnosis present

## 2010-10-19 DIAGNOSIS — I129 Hypertensive chronic kidney disease with stage 1 through stage 4 chronic kidney disease, or unspecified chronic kidney disease: Secondary | ICD-10-CM | POA: Diagnosis present

## 2010-10-19 DIAGNOSIS — M109 Gout, unspecified: Secondary | ICD-10-CM | POA: Diagnosis present

## 2010-10-19 DIAGNOSIS — I2510 Atherosclerotic heart disease of native coronary artery without angina pectoris: Secondary | ICD-10-CM | POA: Diagnosis present

## 2010-10-19 DIAGNOSIS — N179 Acute kidney failure, unspecified: Secondary | ICD-10-CM | POA: Diagnosis present

## 2010-10-19 DIAGNOSIS — I060 Rheumatic aortic stenosis: Secondary | ICD-10-CM

## 2010-10-19 DIAGNOSIS — Z87891 Personal history of nicotine dependence: Secondary | ICD-10-CM

## 2010-10-19 DIAGNOSIS — Z880 Allergy status to penicillin: Secondary | ICD-10-CM

## 2010-10-19 DIAGNOSIS — I10 Essential (primary) hypertension: Secondary | ICD-10-CM

## 2010-10-19 DIAGNOSIS — E119 Type 2 diabetes mellitus without complications: Secondary | ICD-10-CM | POA: Diagnosis present

## 2010-10-19 DIAGNOSIS — D61818 Other pancytopenia: Secondary | ICD-10-CM | POA: Diagnosis present

## 2010-10-19 DIAGNOSIS — Z8673 Personal history of transient ischemic attack (TIA), and cerebral infarction without residual deficits: Secondary | ICD-10-CM

## 2010-10-19 DIAGNOSIS — Z8501 Personal history of malignant neoplasm of esophagus: Secondary | ICD-10-CM

## 2010-10-19 DIAGNOSIS — I509 Heart failure, unspecified: Secondary | ICD-10-CM | POA: Diagnosis present

## 2010-10-19 MED ORDER — ISOSORBIDE MONONITRATE ER 30 MG PO TB24: 30.0000 mg | ORAL_TABLET | Freq: Every day | ORAL | Status: DC

## 2010-10-19 MED ORDER — NITROGLYCERIN 0.4 MG SL SUBL: 0.4000 mg | SUBLINGUAL_TABLET | SUBLINGUAL | Status: DC | PRN

## 2010-10-19 NOTE — Assessment & Plan Note (Signed)
Maintaining NSR on amiodarone.  TSH and LFTs ok in 08/2010.

## 2010-10-19 NOTE — Progress Notes (Signed)
History of Present Illness: Primary Cardiologist:  Dr. 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  He has a h/o persistent atrial fibrillation, maintaining NSR, no longer on coumadin but remains on ASA and moderate to severe AS.  He had a cardiolite in 2006 that demonstrated no scar or ischemia.  He has never had a heart cath.  Carotids in 11/08: 0-39% bilat ICA stenosis.  Last echo 6/11 demonstrated normal LVF, mod to severe AS with a mean gradient of 37 mmHg.  Over the last 2 weeks he notes lower sternal chest pressure.  It can occur with exertion.  It can occur at rest.  He notes associated dyspnea.  No diaphoresis or nausea.  No syncope.  Denies orthopnea, PND or significant change in edema.  It is not getting worse.  Does not have NTG to take.  Pain lasts about 5-6 minutes.  Pain resolves on its own.  Rest does not necessarily make it better.  He saw Dr. Alwyn Ren yesterday.  Cardiac enzymes were obtained and were negative.  Creatinine 1.6, DDimer 0.30, Hgb 11.6 and stable.     Past Medical History  Diagnosis Date  . Anticoagulant long-term use   . Stroke   . Atrial fibrillation   . Bradycardia   . Aortic stenosis     echo 6/11: mild LVH, EF 60-65%, Mod to severe AS (indexed AVA 0.74 sq. cm; mean 37 mmHg, MAD, mod LAE)  . Hypertension   . Diabetes mellitus   . Anemia   . Action tremor   . Thrombocytopenia   . Throat cancer     s/p resection  . Colon polyp   . Anxiety   . Splenomegaly   . Gout   . GERD (gastroesophageal reflux disease)   . Hypothyroidism   . Leukopenia     Chronic pancytopenia  . Degenerative disc disease   . Edema   . Abrasion or friction burn of other, multiple, and unspecified sites, without mention of infection   . Joint effusion, knee     left knee  . Multiple and unspecified open wound of lower limb, complicated   . Pain in limb   . Cellulitis and abscess of unspecified site   . Synovial cyst  of popliteal space     Current Outpatient Prescriptions  Medication Sig Dispense Refill  . allopurinol (ZYLOPRIM) 100 MG tablet Take 100 mg by mouth daily.        [name] Kitchen amiodarone (PACERONE) 200 MG tablet Take 200 mg by mouth daily.        . Ascorbic Acid (VITAMIN C) 500 MG tablet Take 500 mg by mouth daily.        [name] Kitchen aspirin 325 MG tablet Take 325 mg by mouth daily.        . Cholecalciferol (VITAMIN D) 1000 UNITS capsule Take 1,000 Units by mouth daily.        . Cinnamon 500 MG TABS Take 1,000 mg by mouth 2 (two) times daily.       . colchicine 0.6 MG tablet Take 0.6 mg by mouth 2 (two) times daily.        . fish oil-omega-3 fatty acids 1000 MG capsule Take 1 g by mouth daily.        . Flaxseed, Linseed, (FLAX SEED OIL) 1000 MG CAPS Take by mouth daily.        . furosemide (LASIX) 20 MG  tablet Take 20 mg by mouth 3 (three) times daily.        [name] Kitchen levothyroxine (SYNTHROID, LEVOTHROID) 50 MCG tablet Take 50 mcg by mouth as directed. Take 1 tab daily EXCEPT M,W,F, Sun take 1.5 tabs       . losartan (COZAAR) 100 MG tablet Take 100 mg by mouth daily.        . Multiple Vitamin (MULTIVITAMIN) tablet Take 1 tablet by mouth daily.        [name] Kitchen omeprazole (PRILOSEC) 20 MG capsule Take 20 mg by mouth daily.        . potassium chloride SA (K-DUR,KLOR-CON) 20 MEQ tablet Take 20 mEq by mouth daily.        . primidone (MYSOLINE) 50 MG tablet Take 100 mg by mouth 2 (two) times daily.        . propranolol (INDERAL) 20 MG tablet Take 30 mg by mouth 2 (two) times daily.        [name] Kitchen DISCONTD: nitroGLYCERIN (NITROSTAT) 0.3 MG SL tablet Place 1 tablet (0.3 mg total) under the tongue every 5 (five) minutes as needed for chest pain.  25 tablet  2  . isosorbide mononitrate (IMDUR) 30 MG 24 hr tablet Take 1 tablet (30 mg total) by mouth daily.  30 tablet  11  . nitroGLYCERIN (NITROSTAT) 0.4 MG SL tablet Place 1 tablet (0.4 mg total) under the tongue every 5 (five) minutes as needed for chest pain.  25 tablet  6     Allergies: Allergies  Allergen Reactions  . Penicillins   . Triple Antibiotic     Social Hx:  Non smoker  ROS:  Please see the history of present illness.  All other systems reviewed and negative.   Vital Signs: BP 160/70  Pulse 54  Resp 18  Ht 5\' 10"  (1.778 m)  Wt 199 lb 6.4 oz (90.447 kg)  BMI 28.61 kg/m2  PHYSICAL EXAM: Well nourished, well developed, in no acute distress HEENT: normal Neck: no JVD Vascular: radiating murmur to bilateral carotids Cardiac:  normal S1, faint to absent S2; RRR; 3/6 harsh systolic murmur with crescendo-decrescendo quality loudest at RUSB Lungs:  clear to auscultation bilaterally, no wheezing, rhonchi or rales Abd: soft, nontender, no hepatomegaly Ext: trace to 1+ bilateral edema with chronic changes and scattered excoriations Skin: warm and dry Neuro:  CNs 2-12 intact, no focal abnormalities noted Psych: normal affect  EKG:  Sinus bradycardia, heart rate 55, left axis deviation, nonspecific interventricular conduction delay, LVH, no ischemic changes  ASSESSMENT AND PLAN:

## 2010-10-19 NOTE — Assessment & Plan Note (Signed)
Symptoms concerning for angina.  Question if AS is now critical given symptoms.  Will proceed with echo ASAP.  If critical, will need to decide whether or not he is a surgical candidate.  This is my first meeting with him.  He appears to be a marginal surgical candidate at best.  Will need to get him in for close follow up with Dr. Daleen Squibb.  If AS essentially unchanged, would proceed with Myoview.  He has chronic kidney disease.  If low risk scan, would try medical therapy first.  If high risk, consider cardiac cath with Dr. Vern Claude input.  Follow up next week with Dr. Daleen Squibb or me on a day he is in the office.  Start Imdur 30 mg QD.  Give NTG to use PRN.

## 2010-10-19 NOTE — Patient Instructions (Signed)
Your physician has requested that you have an echocardiogram dx 395.0 AORTIC STENOSIS. Echocardiography is a painless test that uses sound waves to create images of your heart. It provides your doctor with information about the size and shape of your heart and how well your heart's chambers and valves are working. This procedure takes approximately one hour. There are no restrictions for this procedure.  Your physician has recommended you make the following change in your medication: START IMDUR 30 MG 1 TABLET DAILY; A PRESCRIPTION FOR NITROGLYCERIN HAS BEEN SENT IN FOR YOU TODAY  Your physician recommends that you schedule a follow-up appointment in: 10/24/10 @ 10 AM TO SEE SCOTT WEAVER, PA-C

## 2010-10-19 NOTE — Assessment & Plan Note (Signed)
Uncontrolled.  Start Isosorbide as noted.

## 2010-10-19 NOTE — Assessment & Plan Note (Addendum)
Check echo as noted above.  Of note, spoke with Dr. Eden Emms who advised me that Dr. Excell Seltzer is looking for ideal patients to do TAVR on.  Consider referral to Dr. Excell Seltzer if he has critical AS and is not a traditional AVR candidate.

## 2010-10-20 ENCOUNTER — Inpatient Hospital Stay (HOSPITAL_COMMUNITY): Payer: Medicare Other

## 2010-10-20 ENCOUNTER — Other Ambulatory Visit (HOSPITAL_COMMUNITY): Payer: Medicare Other | Admitting: Radiology

## 2010-10-20 DIAGNOSIS — I359 Nonrheumatic aortic valve disorder, unspecified: Secondary | ICD-10-CM

## 2010-10-20 DIAGNOSIS — R079 Chest pain, unspecified: Secondary | ICD-10-CM

## 2010-10-20 DIAGNOSIS — M79609 Pain in unspecified limb: Secondary | ICD-10-CM

## 2010-10-20 DIAGNOSIS — M7989 Other specified soft tissue disorders: Secondary | ICD-10-CM

## 2010-10-20 LAB — CBC
HCT: 27.5 % — ABNORMAL LOW (ref 39.0–52.0)
HCT: 25.6 % — ABNORMAL LOW (ref 39.0–52.0)
Hemoglobin: 8.9 g/dL — ABNORMAL LOW (ref 13.0–17.0)
MCH: 30.1 pg (ref 26.0–34.0)
MCH: 30 pg (ref 26.0–34.0)
MCHC: 35.3 g/dL (ref 30.0–36.0)
MCHC: 34.2 g/dL (ref 30.0–36.0)
MCV: 86.8 fL (ref 78.0–100.0)
MCV: 86.5 fL (ref 78.0–100.0)
MCV: 87.7 fL (ref 78.0–100.0)
Platelets: 44 10*3/uL — ABNORMAL LOW (ref 150–400)
Platelets: 39 10*3/uL — ABNORMAL LOW (ref 150–400)
RBC: 2.96 MIL/uL — ABNORMAL LOW (ref 4.22–5.81)
RDW: 16.6 % — ABNORMAL HIGH (ref 11.5–15.5)
RDW: 16.7 % — ABNORMAL HIGH (ref 11.5–15.5)

## 2010-10-20 LAB — DIFFERENTIAL
Basophils Relative: 0 % (ref 0–1)
Basophils Relative: 0 % (ref 0–1)
Eosinophils Absolute: 0 10*3/uL (ref 0.0–0.7)
Eosinophils Absolute: 0 10*3/uL (ref 0.0–0.7)
Eosinophils Relative: 0 % (ref 0–5)
Eosinophils Relative: 0 % (ref 0–5)
Lymphocytes Relative: 11 % — ABNORMAL LOW (ref 12–46)
Lymphs Abs: 0.2 10*3/uL — ABNORMAL LOW (ref 0.7–4.0)
Lymphs Abs: 0.3 10*3/uL — ABNORMAL LOW (ref 0.7–4.0)
Monocytes Absolute: 0.4 10*3/uL (ref 0.1–1.0)
Monocytes Absolute: 0.2 10*3/uL (ref 0.1–1.0)
Monocytes Relative: 8 % (ref 3–12)
Neutro Abs: 2.9 10*3/uL (ref 1.7–7.7)
Neutro Abs: 2.3 10*3/uL (ref 1.7–7.7)

## 2010-10-20 LAB — BASIC METABOLIC PANEL
BUN: 30 mg/dL — ABNORMAL HIGH (ref 6–23)
Chloride: 101 mEq/L (ref 96–112)
Creatinine, Ser: 1.69 mg/dL — ABNORMAL HIGH (ref 0.50–1.35)
GFR calc Af Amer: 41 mL/min — ABNORMAL LOW (ref >90)
GFR calc non Af Amer: 35 mL/min — ABNORMAL LOW (ref >90)

## 2010-10-20 LAB — COMPREHENSIVE METABOLIC PANEL
ALT: 28 U/L (ref 0–53)
AST: 26 U/L (ref 0–37)
Alkaline Phosphatase: 58 U/L (ref 39–117)
CO2: 25 mEq/L (ref 19–32)
GFR calc Af Amer: 43 mL/min — ABNORMAL LOW (ref >90)
GFR calc non Af Amer: 37 mL/min — ABNORMAL LOW (ref >90)
Glucose, Bld: 155 mg/dL — ABNORMAL HIGH (ref 70–99)
Potassium: 4.5 mEq/L (ref 3.5–5.1)
Sodium: 138 mEq/L (ref 135–145)
Total Protein: 5.8 g/dL — ABNORMAL LOW (ref 6.0–8.3)

## 2010-10-20 LAB — GLUCOSE, CAPILLARY: Glucose-Capillary: 142 mg/dL — ABNORMAL HIGH (ref 70–99)

## 2010-10-21 DIAGNOSIS — I359 Nonrheumatic aortic valve disorder, unspecified: Secondary | ICD-10-CM

## 2010-10-21 LAB — CBC
HCT: 23.5 % — ABNORMAL LOW (ref 39.0–52.0)
Hemoglobin: 8.2 g/dL — ABNORMAL LOW (ref 13.0–17.0)
RBC: 2.66 MIL/uL — ABNORMAL LOW (ref 4.22–5.81)
WBC: 2.1 10*3/uL — ABNORMAL LOW (ref 4.0–10.5)

## 2010-10-21 LAB — CARDIAC PANEL(CRET KIN+CKTOT+MB+TROPI)
CK, MB: 1.9 ng/mL (ref 0.3–4.0)
Relative Index: INVALID (ref 0.0–2.5)
Troponin I: <0.3 ng/mL (ref <0.30)

## 2010-10-21 LAB — GLUCOSE, CAPILLARY
Glucose-Capillary: 146 mg/dL — ABNORMAL HIGH (ref 70–99)
Glucose-Capillary: 139 mg/dL — ABNORMAL HIGH (ref 70–99)
Glucose-Capillary: 134 mg/dL — ABNORMAL HIGH (ref 70–99)

## 2010-10-21 LAB — BASIC METABOLIC PANEL
BUN: 32 mg/dL — ABNORMAL HIGH (ref 6–23)
GFR calc Af Amer: 38 mL/min — ABNORMAL LOW (ref >90)
GFR calc non Af Amer: 32 mL/min — ABNORMAL LOW (ref >90)
Potassium: 4.2 mEq/L (ref 3.5–5.1)
Sodium: 137 mEq/L (ref 135–145)

## 2010-10-22 LAB — BASIC METABOLIC PANEL
BUN: 37 mg/dL — ABNORMAL HIGH (ref 6–23)
Calcium: 9.2 mg/dL (ref 8.4–10.5)
GFR calc non Af Amer: 29 mL/min — ABNORMAL LOW (ref >90)
Glucose, Bld: 147 mg/dL — ABNORMAL HIGH (ref 70–99)
Sodium: 138 mEq/L (ref 135–145)

## 2010-10-22 LAB — CBC
Hemoglobin: 9.1 g/dL — ABNORMAL LOW (ref 13.0–17.0)
MCH: 30.1 pg (ref 26.0–34.0)
MCHC: 34.1 g/dL (ref 30.0–36.0)

## 2010-10-22 LAB — GLUCOSE, CAPILLARY: Glucose-Capillary: 103 mg/dL — ABNORMAL HIGH (ref 70–99)

## 2010-10-23 ENCOUNTER — Telehealth: Payer: Self-pay | Admitting: *Deleted

## 2010-10-23 LAB — GLUCOSE, CAPILLARY: Glucose-Capillary: 122 mg/dL — ABNORMAL HIGH (ref 70–99)

## 2010-10-23 LAB — BASIC METABOLIC PANEL
BUN: 42 mg/dL — ABNORMAL HIGH (ref 6–23)
CO2: 23 mEq/L (ref 19–32)
Calcium: 9.2 mg/dL (ref 8.4–10.5)
Creatinine, Ser: 1.86 mg/dL — ABNORMAL HIGH (ref 0.50–1.35)
Glucose, Bld: 151 mg/dL — ABNORMAL HIGH (ref 70–99)

## 2010-10-23 LAB — CBC
Hemoglobin: 9.6 g/dL — ABNORMAL LOW (ref 13.0–17.0)
MCH: 29.9 pg (ref 26.0–34.0)
MCV: 88.5 fL (ref 78.0–100.0)
RBC: 3.21 MIL/uL — ABNORMAL LOW (ref 4.22–5.81)

## 2010-10-23 NOTE — Telephone Encounter (Signed)
Tried tcb again to see why pt cx echo and he has an appt to see SW/TW 10/24/10. Danielle Rankin

## 2010-10-23 NOTE — Telephone Encounter (Signed)
lmom in regards to pt cx echo but has appt 10/16 to see SW/TW. need to rsc echo. Danielle Rankin

## 2010-10-24 ENCOUNTER — Ambulatory Visit: Payer: Medicare Other | Admitting: Physician Assistant

## 2010-10-24 LAB — TYPE AND SCREEN: Unit division: 0

## 2010-10-24 LAB — CBC
HCT: 26.3 % — ABNORMAL LOW (ref 39.0–52.0)
Hemoglobin: 8.8 g/dL — ABNORMAL LOW (ref 13.0–17.0)
WBC: 2 10*3/uL — ABNORMAL LOW (ref 4.0–10.5)

## 2010-10-24 LAB — GLUCOSE, CAPILLARY: Glucose-Capillary: 148 mg/dL — ABNORMAL HIGH (ref 70–99)

## 2010-10-24 NOTE — Telephone Encounter (Signed)
Patient in hospital and will follow up with Dr. Daleen Squibb post discharge.  Tereso Newcomer, PA-C

## 2010-10-24 NOTE — Telephone Encounter (Signed)
Pt returning call to [place]. Please call back.  Pt husband in hospital, please call pt in hospital room: [phone]  Pt wife cancelled ECHO b/c pt went into hospital, ECHO was done in hospital on Friday.

## 2010-10-24 NOTE — Telephone Encounter (Signed)
S/w pt in the hospital today, as per Dr. Daleen Squibb pt is not a candidate for surgery. Danielle Rankin

## 2010-10-25 ENCOUNTER — Telehealth: Payer: Self-pay | Admitting: Cardiology

## 2010-10-25 ENCOUNTER — Ambulatory Visit: Payer: Medicare Other | Admitting: Physician Assistant

## 2010-10-25 MED ORDER — NIACIN ER (ANTIHYPERLIPIDEMIC) 500 MG PO TBCR: 500.0000 mg | EXTENDED_RELEASE_TABLET | Freq: Every day | ORAL | Status: DC

## 2010-10-25 MED ORDER — LOSARTAN POTASSIUM 100 MG PO TABS: 100.0000 mg | ORAL_TABLET | Freq: Every day | ORAL | Status: DC

## 2010-10-25 NOTE — Telephone Encounter (Signed)
Pt called he was released from hospital yesterday and he wanted to talk to you about meds he is taking

## 2010-10-25 NOTE — Telephone Encounter (Signed)
I spoke with pt who  1. Has a heart rate of 50                                       2. Unsure of medications post discharge Pt heart rate was 59 this morning and after he took his medications (Inderal, Imdur) this morning his heart rate is 50  bp 146/62.  He states he has more chest discomfort when his heart rate  Is 50. Denies angina at this time. Reviewed all of his discharge medications. Imdur and Propanolol were not continued on discharge instruction sheet. Pt thought he was supposed to continue these. He just started taking them before going into the hospital when he saw [place] last week. Pt understands each medication on his sheet and only take those medications at this time. Reassurance given to pt and wife. Pt has appt with pcp next week.  Pt will call back if he has any problems.  Mylo Red RN

## 2010-10-25 NOTE — Consult Note (Signed)
NAME:  [name], [name] NO.:  [id number]  MEDICAL RECORD NO.:  [id number]  LOCATION:  3707                         FACILITY:  [facility]  PHYSICIAN:  Luis Abed, MD, FACCDATE OF BIRTH:  03/04/1922  DATE OF CONSULTATION:  10/20/2010 DATE OF DISCHARGE:                                CONSULTATION   The patient is an 77 years of age.  Mr. Rueter cardiac status is followed by Dr. Elijah Birk wall.  The patient has had some chest discomfort in the past.  Cardiac catheterization has never been done.  There was a nuclear stress study in 2006 revealing no ischemia.  The patient has aortic stenosis.  It was thought to be mild in 2009.  However in since June of 2011, the mean gradient was up to 37 mmHg and it was felt to be moderately severe.  The patient does have atrial fibrillation.  He has been on amiodarone and maintaining sinus rhythm.  He has had some chest discomfort recently.  He was actually seen in our office by Mr. Tereso Newcomer, [facility], on October 19, 2010.  The chest discomfort could occur with exertion or at rest.  There is no shortness of breath, diaphoresis, or nausea.  He has not had syncope.  He has not had PND or orthopnea. Recent D-dimer was in the normal range.  The decision had been made as an outpatient to try to proceed with a followup echo to reassess his aortic stenosis.  However in the mean time, he had increased swelling in his left lower extremity.  He has also had a fever.  He is currently admitted to for further evaluation and treatment of his leg.  We are asked to see him for his chest discomfort.  PAST MEDICAL HISTORY:  ALLERGIES:  PENICILLIN.  MEDICATIONS:  Prior to admission he had been on; 1. Allopurinol. 2. Amiodarone 200 mg daily. 3. Aspirin 325. 4. Colchicine 0.6 b.i.d. 5. Fish oil. 6. Furosemide 20 mg 3 times daily. 7. Synthroid 50 mcg daily. 8. Losartan 100 mg daily. 9. Multivitamin. 10.Prilosec KCl 20 mEq daily. 11.Mysoline 100  mg b.i.d. 12.Propranolol 30 mg b.i.d. 13.Imdur 30 mg daily.  OTHER MEDICAL PROBLEMS:  See the extensive list below.  SOCIAL HISTORY:  The patient lives with his wife.  He quit smoking in 1964.  FAMILY HISTORY:  There is no strong family history of coronary artery disease.  REVIEW OF SYSTEMS:  The patient is denies chills, sweats, headache, cough, nausea or vomiting, urinary symptoms.  All other systems are reviewed and are negative other than the HPI.  PHYSICAL EXAM:  VITAL SIGNS:  Blood pressure is 130/67 with a pulse of 64.  Temperature is 98.7.  O2 sat is 99% on room air. GENERAL:  The patient is oriented to person, time and place.  Affect is normal.  His wife is in the room at the time of the evaluation. HEAD:  Atraumatic.  There is no xanthelasma. NECK:  There is no jugular venous distention.  There is no respiratory distress. LUNGS:  Clear.  Respiratory effort is not labored.  There is a radiated murmur in the neck bilaterally. CARDIAC:  S1 and S2.  There is a 3/6 crescendo decrescendo systolic murmur consistent with aortic stenosis. ABDOMEN:  Soft.  There is no significant peripheral edema.  There is discoloration and swelling of the left lower extremity. MUSCULOSKELETAL:  There are no musculoskeletal deformities.  LABORATORY DATA:  Hemoglobin is 8.8.  White blood count is 2300.  BUN is 28 with a creatinine of 1.6.  Potassium is 4.5.  At this point, I do not yet see a chest x-ray result.  EKG reveals nonspecific ST-T wave changes with sinus rhythm.  PROBLEMS: 1. History of gout. 2. History of throat cancer. 3. Thrombocytopenia and leukopenia followed by Dr. Carla Drape.  This is a     significant issue. 4. Hypothyroidism, treated. 5. GERD. 6. Significant anemia.  His hemoglobin is 8.8. 7. Diabetes. 8. History of hypertension. 9. History of atrial fibrillation.  The patient is in sinus rhythm on     amiodarone. 10.Amiodarone therapy. 11.Question of a history of a  CVA.  I cannot document this any     further. 12.History of some edema in the past that has been responsive to     Lasix. 13.History of normal LV function. 14.Carotid artery disease.  His last Doppler was done several years     ago, with 0-39% bilateral disease. 15.Current admission with swelling on the left leg and probable     cellulitis.  This is being treated by the primary team. 16.Chest discomfort.  The patient has never undergone catheterization.     His nuclear scan was negative in 2006.  So far, there is no     definite evidence of myocardial ischemia.  We know very recent labs     as an outpatient revealed negative cardiac enzymes and a normal D-     dimer. 17.Aortic stenosis.  On physical exam, this appears to be getting     worse.  There is a 2nd heart sound but it may be only the pulmonary     component.  The patient is to have a 2D echo today for further     assessment. 18.The overall approach in this case is complex related to his     multitude of problems.  We will have to see his current 2D echo.     If the patient has critical aortic stenosis, and we feel that this     is causing his exertional chest discomfort, we could consider     intervention.  We should 1st speak with Dr. Excell Seltzer, to see if the     patient would be a candidate for any type of percutaneous approach.     Also ischemia needs to be ruled out as the bases of his chest     discomfort.  However, I am very hesitant to recommend     catheterization at this point with his problems and his renal     dysfunction.  The patient does have a hemoglobin of 8.8.  Some     records suggest that this has dropped recently.  In combination of     his anemia with age 76 and leukopenia and other issues including     renal     dysfunction, I feel would be most prudent to move cautiously here     before proceeding with aggressive cardiac treatments.  We will     assess his 2D echo.  His leg of course needs to be treated  as this     is the major reason for his current admission.  Decision has to be     made about the approach his anemia.  We will follow him with you.     Luis Abed, MD, [facility]     JDK/MEDQ  D:  10/20/2010  T:  10/20/2010  Job:  [id number]  Electronically Signed by Willa Rough MD FACC on 10/25/2010 03:24:23 PM

## 2010-10-25 NOTE — Telephone Encounter (Signed)
Yes. I talked with pt wife twice more. Mylo Red RN

## 2010-10-25 NOTE — Telephone Encounter (Signed)
Pt wife calling stating that niasin was one of pt old medicines. Pt is not longer taking medications. Please return pt call with any questions.

## 2010-10-25 NOTE — Telephone Encounter (Signed)
Wife calls back about pt medications. They do not have a prescription, and no bottles at home, for Losartan or for Niacin. Wife has double checked bottles and does not have these. I will send refill in for these medications. Mylo Red RN

## 2010-10-25 NOTE — Telephone Encounter (Signed)
Ok Do they understand what medicines he is to take now? Tereso Newcomer, PA-C

## 2010-10-30 ENCOUNTER — Other Ambulatory Visit (HOSPITAL_COMMUNITY): Payer: Medicare Other | Admitting: Radiology

## 2010-10-31 ENCOUNTER — Ambulatory Visit (INDEPENDENT_AMBULATORY_CARE_PROVIDER_SITE_OTHER): Payer: Medicare Other | Admitting: Internal Medicine

## 2010-10-31 ENCOUNTER — Telehealth: Payer: Self-pay | Admitting: *Deleted

## 2010-10-31 ENCOUNTER — Encounter: Payer: Self-pay | Admitting: Internal Medicine

## 2010-10-31 DIAGNOSIS — L03116 Cellulitis of left lower limb: Secondary | ICD-10-CM

## 2010-10-31 DIAGNOSIS — L03119 Cellulitis of unspecified part of limb: Secondary | ICD-10-CM

## 2010-10-31 DIAGNOSIS — I359 Nonrheumatic aortic valve disorder, unspecified: Secondary | ICD-10-CM

## 2010-10-31 DIAGNOSIS — R519 Headache, unspecified: Secondary | ICD-10-CM

## 2010-10-31 DIAGNOSIS — R51 Headache: Secondary | ICD-10-CM

## 2010-10-31 MED ORDER — GABAPENTIN 100 MG PO CAPS: 100.0000 mg | ORAL_CAPSULE | ORAL | Status: DC

## 2010-10-31 NOTE — Telephone Encounter (Signed)
I spoke with [name] after talking with Dr. Daleen Squibb. [name] has recently been taking sl NTG for chest pressure.  He has been advised to stop taking NTG, stop any strenuous activity. [name] understands. Follow-up appt rescheduled for 11/08/10 at 3pm.  Wife will accompany [name] to appt. Mylo Red RN

## 2010-10-31 NOTE — Telephone Encounter (Signed)
Thank you Debbie. Danielle Rankin

## 2010-10-31 NOTE — Progress Notes (Signed)
  Subjective:    Patient ID: [name], male    DOB: Sep 10, 1922, 77 y.o.   MRN: [id number]  HPI   He was hospitalized 10/11-10/16/2012 with cellulitis of the left lower extremity. That has essentially resolved; he denies fever, chills, or sweats.  He had chest pain while in the hospital and was seen by Dr. Daleen Squibb. 3 sets of cardiac enzymes were negative. Echocardiogram demonstrated severe aortic valve stenosis which has progressed over the past year. He has a pending  followup appointment with Dr. Daleen Squibb as an outpatient  While hospitalized he developed headaches. No specific treatment was initiated.    Review of Systems HEADACHE: Onset: while in hospital   Location: occiput  Quality: throbbing Frequency: daily  Precipitating factors: light  Prior treatment: Tylenol w/o help; wearing sunglasses does Associated Symptoms Nausea/vomiting: no  Photophobia/phonophobia: yes, only light  Tearing of eyes: no  Sinus pain/pressure: no  Red Flags Fever: no  Neck pain/stiffness: no  Vision/speech/swallow/hearing difficulty: flashes of light 10/17  Focal weakness/numbness: no  Altered mental status: no  Trauma: no  New type of headache: yes Anticoagulant use: yes, ASA only  Past medical history of migraine (questionable visual) without pain      Objective:   Physical Exam  Gen. appearance: Well-nourished, in no distress Eyes: Extraocular motion intact, field of vision normal, vision grossly intact, no nystagmus ENT: Canals clear, tympanic membranes normal,  hearing grossly decreased Neck: Normal range of motion, no masses, normal thyroid Cardiovascular: Rate and rhythm normal; no gallops or extra heart sounds. Grade 1.5-2 AS murmur Muscle skeletal:  Tone &  strength normal;1/2 + edema LLE Neuro:no cranial nerve deficit, deep tendon  reflexes decreased R kneeLymph: No cervical or axillary LA Skin: Warm and dry without cellulitis; stasis hyperpigmentation of all limbs Psych: no anxiety  or mood change. Normally interactive and cooperative.         Assessment & Plan:  #1 cellulitis, clinically resolved  #2 chest pain in the context of progressive aortic stenosis  #3 headaches, probable visual migraine  Plan: See orders and recommendations.

## 2010-10-31 NOTE — Patient Instructions (Signed)
Please keep a diary of your headaches . Document  each occurrence on the calendar with notation of : #1 any prodrome ( any non headache symptom such as marked fatigue,visual changes, ,etc ) which precedes actual headache ; #2) severity on 1-10 scale; #3) any triggers ( food/ drink,enviromenntal or weather changes ,physical or emotional stress) in 8-12 hour period prior to the headache; & #4) response to any medications or other intervention. Please review "Headache" @ [facility] for additional information.   

## 2010-11-03 ENCOUNTER — Ambulatory Visit: Payer: Medicare Other | Admitting: Cardiology

## 2010-11-03 NOTE — Discharge Summary (Signed)
NAME:  [name], [name] NO.:  [id number]  MEDICAL RECORD NO.:  [id number]  LOCATION:  3707                         FACILITY:  [facility]  PHYSICIAN:  Peggye Pitt, M.D. DATE OF BIRTH:  29-Jun-1922  DATE OF ADMISSION:  10/19/2010 DATE OF DISCHARGE:  10/24/2010                              DISCHARGE SUMMARY   PRIMARY CARE PHYSICIAN:  [name]  CARDIOLOGIST:  [name], [facility]  DISCHARGE DIAGNOSES: 1. Left lower extremity cellulitis, improving. 2. Severe aortic valve stenosis. 3. Acute on chronic kidney disease, stage III. 4. Diabetes mellitus. 5. Hypertension. 6. Paroxysmally atrial fibrillation, not maintained on chronic     anticoagulation with Coumadin. 7. Pancytopenia, which is chronic followed by [name] for over 30     years.  DISCHARGE MEDICATIONS: 1. Docusate 100 mg twice daily. 2. Doxycycline 100 mg twice daily for 14 days. 3. Oxycodone 5 mg every 4 hours as needed for pain. 4. MiraLAX 17 g daily. 5. Allopurinol 300 mg daily. 6. Aspirin 325 mg daily. 7. Cinnamon 500 mg over the counter 2 capsules daily. 8. Fish oil 1000 mg daily. 9. Flaxseed oil 1000 mg over the counter 1 tablet daily. 10.Lasix 20 mg, which he takes 3 tablets daily. 11.Potassium chloride 20 mEq daily. 12.Synthroid 25 mcg daily. 13.Losartan 100 mg daily. 14.Multivitamin 1 tablet daily. 15.Niacin 500 mg 1 tablet daily. 16.Omeprazole 20 mg daily. 17.Amiodarone 200 mg daily. 18.Primidone 50 mg 2 tablets twice daily. 19.Vitamin C 500 mg over the counter daily. 20.Vitamin D3 1000 units over-the-counter daily.  DISPOSITION AND FOLLOWUP:  [name] will be discharged home today in stable and improved condition.  He will need to follow up with his primary care physician and with [name] as previously scheduled.  IMAGES AND PROCEDURES THIS HOSPITALIZATION: 1. A left tib-fib x-ray on October 20, 2010, that showed no acute     fracture or  dislocation.  Bony overgrowth over the distal fibula     may be related to remote fracture rather than osteomyelitis. 2. Chest x-ray on October 20, 2010, that showed no acute     cardiopulmonary abnormalities.  HISTORY AND PHYSICAL:  For full details, please refer to dictation on October 20, 2010, by [name], however, in brief [name] is a very pleasant 77 year old gentleman who was very active at baseline who came in with complaints of sudden onset of left lower extremity pain and swelling.  He also had chills and rigors; however, no subjective temperatures.  Because of this, he was admitted to our service for further evaluation and management.  HOSPITAL COURSE BY PROBLEM: 1. Left lower extremity cellulitis.  He was placed on vancomycin and     Primaxin on admission.  His leg has done well, it is much less     swollen.  Please note that he has some chronic skin changes to both     lower extremities that appear at baseline. 2. Acute on chronic kidney disease, stage III.  His creatinine went as     high as 2, it is down to 1.8, which is close to his baseline. 3. Severe aortic valve stenosis.  He did have  some chest pain on     admission.  He was ruled out for acute coronary syndrome with 3     sets of negative cardiac enzymes and EKGs that demonstrated no     acute ST or T-wave abnormalities.  He is chronically followed by     [name] with [facility] who has graciously seen him     while in the hospital.  It appears that his aortic stenosis has     rapidly progressed in the past year.  [name] is considering     possible options for relief of his stenotic valve.  He will need to     follow up with [name] in the outpatient setting. 4. Rest of chronic conditions are stable.  VITALS ON DAY OF DISCHARGE:  Blood pressure 104/65, heart rate 67, respirations 18, temp 97.6, and sats were 99% on room air.     Peggye Pitt, M.D.     EH/MEDQ  D:   10/24/2010  T:  10/24/2010  Job:  [id number]  cc:   [name] [name], [facility]  Electronically Signed by Peggye Pitt M.D. on 11/03/2010 03:52:06 PM

## 2010-11-03 NOTE — H&P (Signed)
NAME:  [name], [name] NO.:  [id number]  MEDICAL RECORD NO.:  [id number]  LOCATION:  [facility]                         FACILITY:  [facility]  PHYSICIAN:  Pleas Koch, MD        DATE OF BIRTH:  04-17-1922  DATE OF ADMISSION:  10/19/2010 DATE OF DISCHARGE:                             HISTORY & PHYSICAL   CHIEF COMPLAINT: 1. Swelling of left lower calf. 2. Fever. 3. Pancytopenia.  This pleasant 77 year old Caucasian male states he was in his normal state of health until about 4:00 p.m. on October 19, 2010, when he noticed sudden onset of chills, rigor, and increasing swelling in his left lower extremity.  He denies any specific inciting factor but does think that he was scratching at his left lower calf on October 19, 2010, and then suddenly developed the swelling and fever.  He states that after he ate supper could hardly and has had some chills and he then went to bed.  Found that he had a fever as well as 100.9 when he was in bed and took some Aleve for it and his wife, who relates some of the history states that as it looked like _________ in the past cellulitis and the patient decided to come to emergency room.  The patient is undergoing current workup for cardiac chest pain.  It seems Tereso Newcomer of [facility] on behalf of Dr. Daleen Squibb, who has been doing the workup.  So far, EKG is done and he was actually scheduled for an echocardiogram this morning, October 20, 2010, at Dr. Frederik Pear office.  The patient actually went to Dr. Frederik Pear office yesterday and was doing great, but does state that he was not sure about what was going on yesterday evening and decided to come here.  The patient states that his current chest pain has lasted only about 3-4 minutes at a time and this may better by rest.  He has been told that he might have some stiffness of one of the valves of his heart.  PAST MEDICAL HISTORY:  Significant for the following: 1. Pancytopenia.   Followed chronically by Dr. [name] for the past 35     years without cause. 2. Diabetes mellitus. 3. Hypertension. 4. Chronic renal insufficiency. 5. Paroxysmal atrial fibrillation, not on Coumadin secondary to above     and aortic stenosis. 6. Possible history of CVA. 7. Colonic polyp per report and status post colonoscopy on August 30, 2000, showing adenomatous polyp but no high-grade dysplasia or     invasive malignancy.  Past surgical history is significant for open cholecystectomy and hernia mesh repair.  ALLERGIES:  The patient is allergic to the PENICILLIN GROUP OF MEDICATIONS.  REVIEW OF SYSTEMS:  The patient denies any nausea, vomiting, any dark stool, any tarry stool, any blurred vision, any double vision, any ataxia, any dysuria, any diarrhea, any cough, any cold, any other issues.  PHYSICAL EXAMINATION:  VITAL SIGNS:  Temperature 98.2, blood pressure 119/48, pulse 61, respirations 18.  Pain is 8/10.  O2 sats 98%.  SOCIAL HISTORY:  The patient lives with his wife _________ room 27 and has been with his wife  for the past 6-8 years.  He used to work as, I believe, he was a Visual merchandiser.  He used to smoke but quit in 1964 and occasionally has a mixed drink, but this is only socially.  PERTINENT LABORATORY FINDINGS:  CBC with diff showed hemoglobin 11.6 on October 18, 2010, which dropped today to a total of 8.9.  BUN and creatinine is elevated at 30 and 1.69 which is about his baseline.  [name] INR is 1.27.  PERTINENT IMAGING STUDIES DONE:  Nothing.  ASSESSMENT AND PLAN: 1. Briefly, this is a pleasant 77 year old male with what appears to     be recurrent cellulitis.  He has been treated for this in the past     and will be treated for this again.  The patient apparently is     allergic to penicillin drugs which limited the choice of his     medications and currently we will just keep him on vancomycin and     penicillin until we can streamline his  antibiotics and care.  He     may benefit from chronic suppressive therapy given the fact that     this may be second to third attempt regarding the same.  Please     contact if you have any questions. 2. Hypertension, is moderately controlled.  We await review of his med     rec to re-implement medications. 3. Atrial fibrillation - not on Coumadin.  Amiodarone will be     continued at 200 mg daily. 4. Possible gout. 5. History of throat cancer.  This is a chronic issue and the patient     already follows up with Dr. [name].  We will make him aware of     the patient's presence here. 6. Pancytopenia.  The patients' blood count seems to be dropping.  He     will be typed and screened for 2 units of packed red blood cells.     Repeat labs will be obtained in the morning, i.e., now and should     be followed by rounding physician. 7. Query cardiogenic chest pain.  We would like [facility]     consult on the same regarding the issue. 8. Hypothyroidism.  The patient will be continued on thyroxine. 9. Hyperlipidemia.  We will hold his niacin at present time. 10.Possible congestive heart failure history.  The patient had an     echocardiogram done most recently on June 28, 2009, per my report     with an EF of 60-65% with high ventricular filling pressure.  He     also had noted moderate-to-severe aortic stenosis, valve area of     1.5.  - The patient mentions to me also that he has severe AS and     that they think that the majority of his chest pain may be     consistent with this.  Given this issue if his hemoglobin does drop     below 8, he will need a transfusion as this would compromise his     hemodynamics. 11.Questionable history of coronary artery disease.   The patient will be admitted to [facility] and will be reviewed on a day-to-day basis.  Repeat labs have been ordered.  Rounding physician will coordinate care with Dr. [name] and with Cardiology  to determine final course of action.          ______________________________ Pleas Koch, MD     JS/MEDQ  D:  10/20/2010  T:  10/20/2010  Job:  [id number]  Electronically Signed by Pleas Koch MD on 11/03/2010 02:35:31 PM

## 2010-11-08 ENCOUNTER — Encounter: Payer: Self-pay | Admitting: Cardiology

## 2010-11-08 ENCOUNTER — Ambulatory Visit (INDEPENDENT_AMBULATORY_CARE_PROVIDER_SITE_OTHER): Payer: Medicare Other | Admitting: Cardiology

## 2010-11-08 DIAGNOSIS — D61818 Other pancytopenia: Secondary | ICD-10-CM

## 2010-11-08 DIAGNOSIS — I10 Essential (primary) hypertension: Secondary | ICD-10-CM

## 2010-11-08 DIAGNOSIS — R079 Chest pain, unspecified: Secondary | ICD-10-CM

## 2010-11-08 DIAGNOSIS — I2510 Atherosclerotic heart disease of native coronary artery without angina pectoris: Secondary | ICD-10-CM

## 2010-11-08 DIAGNOSIS — I359 Nonrheumatic aortic valve disorder, unspecified: Secondary | ICD-10-CM

## 2010-11-08 DIAGNOSIS — I6350 Cerebral infarction due to unspecified occlusion or stenosis of unspecified cerebral artery: Secondary | ICD-10-CM

## 2010-11-08 DIAGNOSIS — I4891 Unspecified atrial fibrillation: Secondary | ICD-10-CM

## 2010-11-08 MED ORDER — ZOLPIDEM TARTRATE 5 MG PO TABS: 5.0000 mg | ORAL_TABLET | Freq: Every evening | ORAL | Status: DC | PRN

## 2010-11-08 NOTE — Assessment & Plan Note (Signed)
He now has symptomatic severe aortic stenosis. He may also have concomitant coronary disease. I discussed this with him and his wife at length for over 30 minutes. I've also called Dr. Bernette Redbird at [facility] who will see him early next week at the latest for possible TAVR and cor intervention. Mr. And Mrs. Boyajian understand that he is high risk but without intervention is high risk for sudden death. I have strongly advised him to her tail his activities including dear hunting or any manual work. He is also advised not to use sublingual nitroglycerin with his severe aortic stenosis. I will be anxious to hear back from Dr. Romeo Apple. Hopefully he can help him.

## 2010-11-08 NOTE — Progress Notes (Signed)
HPI [name] returns today after being discharged the hospital for left lower steady cellulitis.  During his stay, an echocardiogram was changed which showed significant progression of his aortic stenosis. It is now severe and symptomatic. He is having significant chest tightness with exertion. He is not known to have significant coronary disease though I Have been treating him medically for that. He has taken several nitroglycerin for the chest discomfort.  He wants to remain active but is not a surgical candidate. Factors included heart age, chronic renal insufficiency with a baseline creatinine of around 1.7, and chronic pancytopenia. His last white count was around 2000, hemoglobin around 9 and his platelet count in the high 50s. There denies any bleeding. I took him off Coumadin in the past because of risky hobbies and activities and some falling. His gait is actually good. He  I have not examined him today. EKG was not repeated. Past Medical History  Diagnosis Date  . Anticoagulant long-term use   . Stroke   . Atrial fibrillation   . Bradycardia   . Aortic stenosis     Echo 6/11: mild LVH, EF 60-65%, Mod to severe AS (indexed AVA 0.74 sq. cm; mean 37 mmHg, MAD, mod LAE)  . Hypertension   . Diabetes mellitus   . Anemia   . Action tremor   . Thrombocytopenia   . Throat cancer     s/p resection  . Colon polyp   . Anxiety   . Splenomegaly   . Gout   . GERD (gastroesophageal reflux disease)   . Hypothyroidism   . Leukopenia     Chronic pancytopenia  . Degenerative disc disease   . Edema   . Abrasion or friction burn of other, multiple, and unspecified sites, without mention of infection   . Joint effusion, knee     left knee  . Multiple and unspecified open wound of lower limb, complicated   . Pain in limb   . Cellulitis and abscess of unspecified site   . Synovial cyst of popliteal space   . Cellulitis of left leg 10/11-16/2012    Past Surgical History  Procedure  Date  . Cholecystectomy   . Joint effusion     left knee    Family History  Problem Relation Age of Onset  . Colon cancer    . Stroke    . Cancer Other     colon  . Stroke Other     History   Social History  . Marital Status: Married    Spouse Name: N/A    Number of Children: 3  . Years of Education: N/A   Occupational History  . Retired Company secretary    Social History Main Topics  . Smoking status: Former Smoker    Quit date: 01/11/1962  . Smokeless tobacco: Not on file  . Alcohol Use: No  . Drug Use: Not on file  . Sexually Active: Not on file   Other Topics Concern  . 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 Penicillins      ? Rash, but unsure  . Triple Antibiotic     Current Outpatient Prescriptions  Medication Sig Dispense Refill  . allopurinol (ZYLOPRIM) 100 MG tablet Take 100 mg by mouth daily.       [name] Kitchen amiodarone (PACERONE) 200 MG tablet Take 200 mg by mouth daily.        [name] Kitchen  Ascorbic Acid (VITAMIN C) 500 MG tablet Take 500 mg by mouth daily.        [name] Kitchen aspirin 325 MG tablet Take 325 mg by mouth daily.        . Cholecalciferol (VITAMIN D) 1000 UNITS capsule Take 1,000 Units by mouth daily.        . Cinnamon 500 MG TABS Take 1,000 mg by mouth 2 (two) times daily.       [name] Kitchen docusate sodium (COLACE) 100 MG capsule Take 100 mg by mouth daily.        [name] Kitchen doxycycline (DORYX) 100 MG DR capsule Take 100 mg by mouth 2 (two) times daily. For 14 days       . fish oil-omega-3 fatty acids 1000 MG capsule Take 1 g by mouth daily.        . Flaxseed, Linseed, (FLAX SEED OIL) 1000 MG CAPS Take by mouth daily.        . furosemide (LASIX) 20 MG tablet Take 20 mg by mouth 3 (three) times daily.        [name] Kitchen gabapentin (NEURONTIN) 100 MG capsule Take 1 capsule (100 mg total) by mouth as directed. 1 pill every 8 hours as needed for headache  30 capsule  1  . isosorbide dinitrate (ISORDIL) 30 MG tablet Take 30 mg by  mouth daily.        [name] Kitchen levothyroxine (SYNTHROID, LEVOTHROID) 50 MCG tablet Take 50 mcg by mouth. Pt states taking 50mg  Tuesday, Thursday, Friday, takes 1 & 1/2 Monday, Wednesday, Friday and Sunday.       . losartan (COZAAR) 100 MG tablet Take 1 tablet (100 mg total) by mouth daily.  30 tablet  6  . Multiple Vitamin (MULTIVITAMIN) tablet Take 1 tablet by mouth daily.        . niacin (NIASPAN) 500 MG CR tablet Take 1 tablet (500 mg total) by mouth at bedtime.  30 tablet  6  . nitroGLYCERIN (NITROSTAT) 0.4 MG SL tablet Place 1 tablet (0.4 mg total) under the tongue every 5 (five) minutes as needed for chest pain.  25 tablet  6  . omeprazole (PRILOSEC) 20 MG capsule Take 20 mg by mouth daily.        [name] Kitchen oxyCODONE (OXY IR/ROXICODONE) 5 MG immediate release tablet Take 5 mg by mouth every 4 (four) hours as needed.        . polyethylene glycol (MIRALAX / GLYCOLAX) packet Take 17 g by mouth daily.        . potassium chloride SA (K-DUR,KLOR-CON) 20 MEQ tablet Take 20 mEq by mouth daily.        . primidone (MYSOLINE) 50 MG tablet Take 100 mg by mouth 2 (two) times daily.        [name] Kitchen zolpidem (AMBIEN) 5 MG tablet Take 1 tablet (5 mg total) by mouth at bedtime as needed for sleep.  30 tablet  3    ROS Negative other than HPI.   PE No change from previous exam. Filed Vitals:   11/08/10 1504  BP: 132/86  Pulse: 80  Height: 5\' 10"  (1.778 m)  Weight: 184 lb (83.462 kg)    EKG  Labs and Studies Reviewed.   Lab Results  Component Value Date   WBC 2.0* 10/24/2010   HGB 8.8* 10/24/2010   HCT 26.3* 10/24/2010   MCV 88.3 10/24/2010   PLT 57* 10/24/2010      Chemistry      Component Value Date/Time   NA 136 10/23/2010  0610   K 4.0 10/23/2010 0610   CL 101 10/23/2010 0610   CO2 23 10/23/2010 0610   BUN 42* 10/23/2010 0610   CREATININE 1.86* 10/23/2010 0610      Component Value Date/Time   CALCIUM 9.2 10/23/2010 0610   ALKPHOS 58 10/20/2010 0705   AST 26 10/20/2010 0705   ALT 28 10/20/2010  0705   BILITOT 0.8 10/20/2010 0705       No results found for this basename: CHOL   No results found for this basename: HDL   No results found for this basename: LDLCALC   No results found for this basename: TRIG   No results found for this basename: CHOLHDL   Lab Results  Component Value Date   HGBA1C 6.0 10/18/2010   Lab Results  Component Value Date   ALT 28 10/20/2010   AST 26 10/20/2010   ALKPHOS 58 10/20/2010   BILITOT 0.8 10/20/2010   Lab Results  Component Value Date   TSH 3.25 08/30/2010

## 2010-11-10 ENCOUNTER — Ambulatory Visit: Payer: Medicare Other | Admitting: Cardiology

## 2010-11-15 ENCOUNTER — Encounter: Payer: Medicare Other | Admitting: Cardiology

## 2010-12-06 ENCOUNTER — Other Ambulatory Visit: Payer: Self-pay | Admitting: *Deleted

## 2010-12-06 ENCOUNTER — Telehealth: Payer: Self-pay | Admitting: *Deleted

## 2010-12-06 NOTE — Telephone Encounter (Signed)
Received call through triage from patient wife, apparently patient has recently been seen by the Cardiologist and Dr James Ivanoff, and needs followup with Dr [name] ASAP to evaluate and treat for ongoing problem with thrombocytopenia. Wife states Dr James Ivanoff was to get in touch with Dr [name] and she was calling to follow up on this. Will notify Dr [name] and his desk nurse of situation for scheduling of appts. Wife call back number is [phone].

## 2010-12-07 ENCOUNTER — Telehealth: Payer: Self-pay | Admitting: Oncology

## 2010-12-07 NOTE — Telephone Encounter (Signed)
S/w the pt's wife and she is aware of the appt with lisa on 12/08/2010@10 :15am

## 2010-12-08 ENCOUNTER — Ambulatory Visit (HOSPITAL_BASED_OUTPATIENT_CLINIC_OR_DEPARTMENT_OTHER): Payer: Medicare Other

## 2010-12-08 ENCOUNTER — Other Ambulatory Visit: Payer: Self-pay | Admitting: Oncology

## 2010-12-08 ENCOUNTER — Ambulatory Visit: Payer: Medicare Other | Admitting: Oncology

## 2010-12-08 ENCOUNTER — Ambulatory Visit (HOSPITAL_BASED_OUTPATIENT_CLINIC_OR_DEPARTMENT_OTHER): Payer: Medicare Other | Admitting: Nurse Practitioner

## 2010-12-08 ENCOUNTER — Telehealth: Payer: Self-pay | Admitting: Oncology

## 2010-12-08 DIAGNOSIS — R079 Chest pain, unspecified: Secondary | ICD-10-CM

## 2010-12-08 DIAGNOSIS — I359 Nonrheumatic aortic valve disorder, unspecified: Secondary | ICD-10-CM

## 2010-12-08 DIAGNOSIS — D61818 Other pancytopenia: Secondary | ICD-10-CM

## 2010-12-08 DIAGNOSIS — R0989 Other specified symptoms and signs involving the circulatory and respiratory systems: Secondary | ICD-10-CM

## 2010-12-08 DIAGNOSIS — D6949 Other primary thrombocytopenia: Secondary | ICD-10-CM

## 2010-12-08 LAB — CBC WITH DIFFERENTIAL/PLATELET
BASO%: 0.3 % (ref 0.0–2.0)
Eosinophils Absolute: 0 10*3/uL (ref 0.0–0.5)
MCHC: 34 g/dL (ref 32.0–36.0)
MONO#: 0.2 10*3/uL (ref 0.1–0.9)
NEUT#: 2.6 10*3/uL (ref 1.5–6.5)
RBC: 4.19 10*6/uL — ABNORMAL LOW (ref 4.20–5.82)
RDW: 15.9 % — ABNORMAL HIGH (ref 11.0–14.6)
WBC: 3.4 10*3/uL — ABNORMAL LOW (ref 4.0–10.3)
lymph#: 0.6 10*3/uL — ABNORMAL LOW (ref 0.9–3.3)

## 2010-12-08 MED ORDER — ROMIPLOSTIM 250 MCG ~~LOC~~ SOLR
1.0000 ug/kg | Freq: Once | SUBCUTANEOUS | Status: AC
  Administered 2010-12-08: 85 ug via SUBCUTANEOUS
  Filled 2010-12-08: qty 0.17

## 2010-12-08 NOTE — Progress Notes (Signed)
OFFICE PROGRESS NOTE  Interval history:  [name] is an 77 year old man with chronic pancytopenia. He was recently admitted to [facility] for further evaluation of severe aortic stenosis and consideration for surgery. He was evaluated by Dr. Sharen Counter, hematology, during the hospitalization due to pancytopenia. He underwent a bone marrow biopsy on 11/20/2010 with findings of a slightly hypercellular marrow (55%) for age; erythroid predominant trilineage hematopoeisis; a spectrum of maturation in all 3 hematopoietic lineages, interstitial histiocytosis with Niemann-Pick like histiocytes; negative for increased blasts; negative for significant dysplasia; negative for lymphoma.  Dr. James Ivanoff has recommended a trial of Nplate at an initial dose of 1 mcg per kilogram every week with a goal platelet count of greater than 100,000 prior to aortic valve surgery.  [name] reports that overall he feels well. He continues to note easy bruising. He denies bleeding. He specifically denies epistaxis, hematuria, hematochezia, melena. He has mild dyspnea on exertion. He has occasional chest pain. He takes nitroglycerin as needed.     Objective: Blood pressure 142/84, pulse 20, temperature 97.3 F (36.3 C), temperature source Oral, height 5\' 10"  (1.778 m), weight 186 lb 11.2 oz (84.687 kg).  Oropharynx is without thrush or ulceration. Lungs are clear. Regular cardiac rhythm. 2-3/6 systolic murmur. Abdomen is soft and nontender. No organomegaly. Trace lower leg edema bilaterally. Hyperpigmentation at the forearms and lower legs. Ecchymoses at the forearms as well.   Lab Results: Lab Results  Component Value Date   WBC 2.0* 10/24/2010   HGB 8.8* 10/24/2010   HCT 26.3* 10/24/2010   MCV 88.3 10/24/2010   PLT 57* 10/24/2010    Chemistry:      Component Value Date/Time   NA 136 10/23/2010 0610   K 4.0 10/23/2010 0610   CL 101 10/23/2010 0610   CO2 23 10/23/2010 0610   GLUCOSE 151* 10/23/2010 0610   BUN 42* 10/23/2010 0610   CREATININE 1.86* 10/23/2010 0610   CALCIUM 9.2 10/23/2010 0610   PROT 5.8* 10/20/2010 0705   ALBUMIN 3.4* 10/20/2010 0705   AST 26 10/20/2010 0705   ALT 28 10/20/2010 0705   ALKPHOS 58 10/20/2010 0705   BILITOT 0.8 10/20/2010 0705   GFRNONAA 31* 10/23/2010 0610   GFRAA 36* 10/23/2010 0610     Studies/Results: No results found.  Medications: I have reviewed the patient's current medications.  Assessment/Plan: 1. Chronic pancytopenia.  2. Splenomegaly.  3. History of atrial fibrillation.  4. Admission with a left lower extremity cellulitis in June 2009.  5. "Hot flashes," etiology unclear.  6. Diabetes.   7. Severe aortic stenosis-he is being evaluated for aortic valve replacement. 8.     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  Disposition-plan to proceed with a trial of Nplate 1 mcg/kg weekly in an effort to increase the platelet count to greater than 100,000 prior to aortic valve surgery. Dr. [name] reviewed potential toxicities associated with Nplate including bone marrow fibrosis and thrombotic/thromboembolic complications. [name] is agreeable to proceed. He will receive the first injection today. He will return for a followup visit on 12/29/2010. He will contact the office in the interim with any problems.  Patient seen with Dr. [name].     Lonna Cobb ANP/GNP-BC

## 2010-12-08 NOTE — Telephone Encounter (Signed)
gve the pt his dec 2012 appt calendar °

## 2010-12-15 ENCOUNTER — Other Ambulatory Visit: Payer: Self-pay | Admitting: *Deleted

## 2010-12-15 ENCOUNTER — Ambulatory Visit (HOSPITAL_BASED_OUTPATIENT_CLINIC_OR_DEPARTMENT_OTHER): Payer: Medicare Other

## 2010-12-15 DIAGNOSIS — R161 Splenomegaly, not elsewhere classified: Secondary | ICD-10-CM

## 2010-12-15 DIAGNOSIS — D696 Thrombocytopenia, unspecified: Secondary | ICD-10-CM

## 2010-12-15 DIAGNOSIS — D61818 Other pancytopenia: Secondary | ICD-10-CM

## 2010-12-15 LAB — CBC WITH DIFFERENTIAL/PLATELET
BASO%: 0.3 % (ref 0.0–2.0)
EOS%: 1.2 % (ref 0.0–7.0)
Eosinophils Absolute: 0 10*3/uL (ref 0.0–0.5)
MCH: 30.9 pg (ref 27.2–33.4)
MCHC: 34.2 g/dL (ref 32.0–36.0)
MCV: 90.4 fL (ref 79.3–98.0)
MONO%: 6.7 % (ref 0.0–14.0)
NEUT#: 2.2 10*3/uL (ref 1.5–6.5)
RBC: 4.08 10*6/uL — ABNORMAL LOW (ref 4.20–5.82)
RDW: 15.3 % — ABNORMAL HIGH (ref 11.0–14.6)

## 2010-12-15 MED ORDER — ROMIPLOSTIM 250 MCG ~~LOC~~ SOLR
85.0000 ug | Freq: Once | SUBCUTANEOUS | Status: AC
  Administered 2010-12-15: 85 ug via SUBCUTANEOUS
  Filled 2010-12-15: qty 0.17

## 2010-12-18 ENCOUNTER — Telehealth: Payer: Self-pay | Admitting: Oncology

## 2010-12-18 ENCOUNTER — Ambulatory Visit (HOSPITAL_BASED_OUTPATIENT_CLINIC_OR_DEPARTMENT_OTHER): Payer: Medicare Other

## 2010-12-18 NOTE — Telephone Encounter (Signed)
This is a test.

## 2010-12-22 ENCOUNTER — Other Ambulatory Visit: Payer: Self-pay | Admitting: Oncology

## 2010-12-29 ENCOUNTER — Ambulatory Visit (HOSPITAL_BASED_OUTPATIENT_CLINIC_OR_DEPARTMENT_OTHER): Payer: Medicare Other | Admitting: Nurse Practitioner

## 2010-12-29 ENCOUNTER — Telehealth: Payer: Self-pay | Admitting: Oncology

## 2010-12-29 ENCOUNTER — Other Ambulatory Visit: Payer: Medicare Other

## 2010-12-29 ENCOUNTER — Other Ambulatory Visit: Payer: Self-pay | Admitting: Oncology

## 2010-12-29 ENCOUNTER — Ambulatory Visit: Payer: Medicare Other

## 2010-12-29 VITALS — BP 120/70 | HR 74 | Temp 97.9°F | Ht 70.0 in | Wt 187.3 lb

## 2010-12-29 DIAGNOSIS — D696 Thrombocytopenia, unspecified: Secondary | ICD-10-CM

## 2010-12-29 DIAGNOSIS — I359 Nonrheumatic aortic valve disorder, unspecified: Secondary | ICD-10-CM

## 2010-12-29 DIAGNOSIS — D6949 Other primary thrombocytopenia: Secondary | ICD-10-CM

## 2010-12-29 DIAGNOSIS — E119 Type 2 diabetes mellitus without complications: Secondary | ICD-10-CM

## 2010-12-29 LAB — CBC WITH DIFFERENTIAL/PLATELET
BASO%: 0.3 % (ref 0.0–2.0)
Eosinophils Absolute: 0 10*3/uL (ref 0.0–0.5)
HCT: 36.2 % — ABNORMAL LOW (ref 38.4–49.9)
LYMPH%: 16.1 % (ref 14.0–49.0)
MONO#: 0.2 10*3/uL (ref 0.1–0.9)
NEUT#: 2.2 10*3/uL (ref 1.5–6.5)
NEUT%: 76.9 % — ABNORMAL HIGH (ref 39.0–75.0)
Platelets: 99 10*3/uL — ABNORMAL LOW (ref 140–400)
WBC: 2.9 10*3/uL — ABNORMAL LOW (ref 4.0–10.3)
lymph#: 0.5 10*3/uL — ABNORMAL LOW (ref 0.9–3.3)

## 2010-12-29 MED ORDER — ROMIPLOSTIM 250 MCG ~~LOC~~ SOLR
1.0000 ug/kg | Freq: Once | SUBCUTANEOUS | Status: AC
  Administered 2010-12-29: 85 ug via SUBCUTANEOUS
  Filled 2010-12-29: qty 0.17

## 2010-12-29 NOTE — Progress Notes (Signed)
OFFICE PROGRESS NOTE  Interval history:  [name] is an 77 year old man with chronic pancytopenia. He was recently admitted to [facility] for further evaluation of severe aortic stenosis and consideration for surgery. He was evaluated by Dr. Sharen Counter, hematology, during the hospitalization due to pancytopenia. He underwent a bone marrow biopsy on 11/20/2010 with findings of a slightly hypercellular marrow (55%) for age; erythroid predominant trilineage hematopoeisis; a spectrum of maturation in all 3 hematopoietic lineages, interstitial histiocytosis with Niemann-Pick like histiocytes; negative for increased blasts; negative for significant dysplasia; negative for lymphoma.  Dr. James Ivanoff recommended a trial of Nplate at an initial dose of 1 mcg per kilogram every week with a goal platelet count of greater than 100,000 prior to aortic valve surgery. He received the first Nplate injection on 12/08/2010 at which time the platelet count was 52,000. He received a second injection on 12/15/2010 at which time the platelet count was 74,000. He received the third injection at [facility] on 12/22/2010.  [name] reports that overall he feels well. No bleeding. He has mild dyspnea on exertion. He has occasional chest pain. He takes nitroglycerin as needed. He reports the aortic valve surgery is scheduled on 01/23/2011.     Objective: Blood pressure 120/70, pulse 74, temperature 97.9 F (36.6 C), temperature source Oral, height 5\' 10"  (1.778 m), weight 187 lb 4.8 oz (84.959 kg).  Oropharynx is without thrush or ulceration. Lungs are clear. Regular cardiac rhythm. 2-3/6 systolic murmur. Abdomen is soft and nontender. No organomegaly. Trace lower leg edema bilaterally. Hyperpigmentation at the forearms and lower legs. Ecchymoses at the forearms as well.   Lab Results: Lab Results  Component Value Date   WBC 2.9* 12/29/2010   HGB 12.6* 12/29/2010   HCT 36.2* 12/29/2010   MCV 87.9  12/29/2010   PLT 99* 12/29/2010    Chemistry:      Component Value Date/Time   NA 136 10/23/2010 0610   K 4.0 10/23/2010 0610   CL 101 10/23/2010 0610   CO2 23 10/23/2010 0610   GLUCOSE 151* 10/23/2010 0610   BUN 42* 10/23/2010 0610   CREATININE 1.86* 10/23/2010 0610   CALCIUM 9.2 10/23/2010 0610   PROT 5.8* 10/20/2010 0705   ALBUMIN 3.4* 10/20/2010 0705   AST 26 10/20/2010 0705   ALT 28 10/20/2010 0705   ALKPHOS 58 10/20/2010 0705   BILITOT 0.8 10/20/2010 0705   GFRNONAA 31* 10/23/2010 0610   GFRAA 36* 10/23/2010 0610     Studies/Results: No results found.  Medications: I have reviewed the patient's current medications.  Assessment/Plan: 1. Chronic pancytopenia.  2. Splenomegaly.  3. History of atrial fibrillation.  4. Admission with a left lower extremity cellulitis in June 2009.  5. "Hot flashes," etiology unclear.  6. Diabetes.   7. Severe aortic stenosis-he is being evaluated for aortic valve replacement. 8.     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  Disposition-the platelet count has improved since beginning Nplate. He will receive the fourth weekly injection today. He will return for injections on 01/05/2011, 01/12/2011 and 01/19/2011. The surgery is scheduled on 01/23/2011. He will return for a followup visit with Dr. [name] 02/23/2011. He will contact the office the interim with any problems.  Patient reviewed with Dr. [name].  Lonna Cobb ANP/GNP-BC

## 2010-12-29 NOTE — Telephone Encounter (Signed)
appt moved out to 2/25 per lisa,inj appts made for 12/28 1/4 and 1/11.not printed per wife req   aom

## 2011-01-05 ENCOUNTER — Telehealth: Payer: Self-pay | Admitting: Cardiology

## 2011-01-05 ENCOUNTER — Other Ambulatory Visit (HOSPITAL_BASED_OUTPATIENT_CLINIC_OR_DEPARTMENT_OTHER): Payer: Medicare Other

## 2011-01-05 ENCOUNTER — Ambulatory Visit (HOSPITAL_BASED_OUTPATIENT_CLINIC_OR_DEPARTMENT_OTHER): Payer: Medicare Other

## 2011-01-05 DIAGNOSIS — D696 Thrombocytopenia, unspecified: Secondary | ICD-10-CM

## 2011-01-05 LAB — CBC WITH DIFFERENTIAL/PLATELET
BASO%: 0.9 % (ref 0.0–2.0)
EOS%: 0.9 % (ref 0.0–7.0)
HCT: 35.9 % — ABNORMAL LOW (ref 38.4–49.9)
LYMPH%: 17.7 % (ref 14.0–49.0)
MCH: 30.5 pg (ref 27.2–33.4)
MCHC: 34.9 g/dL (ref 32.0–36.0)
MCV: 87.3 fL (ref 79.3–98.0)
NEUT%: 75 % (ref 39.0–75.0)
Platelets: 91 10*3/uL — ABNORMAL LOW (ref 140–400)

## 2011-01-05 MED ORDER — AMIODARONE HCL 200 MG PO TABS: 200.0000 mg | ORAL_TABLET | Freq: Every day | ORAL | Status: DC

## 2011-01-05 MED ORDER — LOSARTAN POTASSIUM 100 MG PO TABS: 100.0000 mg | ORAL_TABLET | Freq: Every day | ORAL | Status: DC

## 2011-01-05 MED ORDER — ROMIPLOSTIM 250 MCG ~~LOC~~ SOLR
1.0000 ug/kg | Freq: Once | SUBCUTANEOUS | Status: AC
  Administered 2011-01-05: 85 ug via SUBCUTANEOUS
  Filled 2011-01-05: qty 0.17

## 2011-01-05 MED ORDER — POTASSIUM CHLORIDE CRYS ER 20 MEQ PO TBCR: 20.0000 meq | EXTENDED_RELEASE_TABLET | Freq: Every day | ORAL | Status: DC

## 2011-01-05 MED ORDER — NITROGLYCERIN 0.4 MG SL SUBL: 0.4000 mg | SUBLINGUAL_TABLET | SUBLINGUAL | Status: DC | PRN

## 2011-01-05 MED ORDER — VITAMIN C 500 MG PO TABS: 500.0000 mg | ORAL_TABLET | Freq: Every day | ORAL | Status: AC

## 2011-01-05 MED ORDER — ALLOPURINOL 100 MG PO TABS: 100.0000 mg | ORAL_TABLET | Freq: Every day | ORAL | Status: DC

## 2011-01-05 MED ORDER — PRIMIDONE 50 MG PO TABS: 100.0000 mg | ORAL_TABLET | Freq: Two times a day (BID) | ORAL | Status: DC

## 2011-01-05 MED ORDER — NIACIN ER (ANTIHYPERLIPIDEMIC) 500 MG PO TBCR: 500.0000 mg | EXTENDED_RELEASE_TABLET | Freq: Every day | ORAL | Status: DC

## 2011-01-05 MED ORDER — FUROSEMIDE 20 MG PO TABS: 40.0000 mg | ORAL_TABLET | Freq: Every day | ORAL | Status: DC

## 2011-01-05 MED ORDER — ISOSORBIDE DINITRATE 30 MG PO TABS: 30.0000 mg | ORAL_TABLET | Freq: Every day | ORAL | Status: DC

## 2011-01-05 MED ORDER — VITAMIN D 1000 UNITS PO CAPS: 1000.0000 [IU] | ORAL_CAPSULE | Freq: Every day | ORAL | Status: AC

## 2011-01-05 MED ORDER — OMEPRAZOLE 20 MG PO CPDR: 20.0000 mg | DELAYED_RELEASE_CAPSULE | Freq: Every day | ORAL | Status: DC

## 2011-01-05 NOTE — Telephone Encounter (Signed)
New Msg: Pt wife calling stating that pt insurance for prescriptions is changing as of Jan 09, 2011 and was told to contact pt prescriber to inform of changes and need for pre authorization. Please return pt call to discuss further if necessary. Pt wife said if needed she can come into office today.  Previously pt was getting medication through [place] instead of [facility].   [employer] HMO [phone]

## 2011-01-05 NOTE — Telephone Encounter (Signed)
Called / number busy. We just need name of mail order pharmacy to send to. Will try to contact again.

## 2011-01-05 NOTE — Telephone Encounter (Signed)
FU Call: Mail Order Pharmacy is [facility] fax # [phone] New Prescription for pt continued medications  Member ID: [id number]

## 2011-01-05 NOTE — Telephone Encounter (Signed)
Spoke with pt and pts wife.  Made appointment for February 2013 and will have meds ready for pt at appointment.

## 2011-01-05 NOTE — Telephone Encounter (Signed)
New Rx's sent to [facility] per pt request.

## 2011-01-05 NOTE — Telephone Encounter (Signed)
Spoke with wife, she will call back with mail order pharmacy.

## 2011-01-10 ENCOUNTER — Encounter: Payer: Self-pay | Admitting: Cardiology

## 2011-01-10 ENCOUNTER — Ambulatory Visit (INDEPENDENT_AMBULATORY_CARE_PROVIDER_SITE_OTHER): Payer: Medicare Other | Admitting: Cardiology

## 2011-01-10 ENCOUNTER — Telehealth: Payer: Self-pay | Admitting: *Deleted

## 2011-01-10 ENCOUNTER — Telehealth: Payer: Self-pay | Admitting: Cardiology

## 2011-01-10 DIAGNOSIS — I359 Nonrheumatic aortic valve disorder, unspecified: Secondary | ICD-10-CM

## 2011-01-10 DIAGNOSIS — I2510 Atherosclerotic heart disease of native coronary artery without angina pectoris: Secondary | ICD-10-CM

## 2011-01-10 DIAGNOSIS — R079 Chest pain, unspecified: Secondary | ICD-10-CM

## 2011-01-10 NOTE — Telephone Encounter (Signed)
New msg Pt has had chest pain last night, no sob sob som hurting today transferred to triage nurse

## 2011-01-10 NOTE — Telephone Encounter (Signed)
App made w/ dr wall this am, pt agreeable.

## 2011-01-10 NOTE — Assessment & Plan Note (Signed)
Noncardiac. Reassurance given. Patient advised to really limit physical activity until his aortic valve is replaced.

## 2011-01-10 NOTE — Telephone Encounter (Signed)
Pt c/o cp off and on x 2 days. Nitro helpful but not taking all pain away. Has not taken any today and told to take one. Has cp while at rest. bp 133/69, denies sob, no nausea, pain stays mid chest, pt has not taken any meds for gerd. App made this am with dr wall.

## 2011-01-10 NOTE — Patient Instructions (Signed)
Keep scheduled appointment on March 07, 2011.

## 2011-01-10 NOTE — Progress Notes (Signed)
HPI [name] comes in today for chest pain. It is along the lower right sternal border and well localized. It hurts to cough. He denies any fever, chills, hemoptysis. He took nitroglycerin with no relief.  He is here aortic stenosis and is scheduled for minimally invasive aortic valve replacement due on January 15. He has no significant obstructive coronary disease.  Past Medical History  Diagnosis Date  . Anticoagulant long-term use   . Stroke   . Campath-induced atrial fibrillation   . Bradycardia   . Aortic stenosis     Echo 6/11: mild LVH, EF 60-65%, Mod to severe AS (indexed AVA 0.74 sq. cm; mean 37 mmHg, MAD, mod LAE)  . Hypertension   . Diabetes mellitus   . Anemia   . Action tremor   . Thrombocytopenia   . Throat cancer     s/p resection  . Colon polyp   . Anxiety   . Splenomegaly   . Gout   . GERD (gastroesophageal reflux disease)   . Hypothyroidism   . Leukopenia     Chronic pancytopenia  . Degenerative disc disease   . Edema   . Abrasion or friction burn of other, multiple, and unspecified sites, without mention of infection   . Joint effusion, knee     left knee  . Multiple and unspecified open wound of lower limb, complicated   . Pain in limb   . Cellulitis and abscess of unspecified site   . Synovial cyst of popliteal space   . Cellulitis of left leg 10/11-16/2012    Current Outpatient Prescriptions  Medication Sig Dispense Refill  . amiodarone (PACERONE) 200 MG tablet Take 1 tablet (200 mg total) by mouth daily.  90 tablet  3  . aspirin 325 MG tablet Take 325 mg by mouth daily.        . Cholecalciferol (VITAMIN D) 1000 UNITS capsule Take 1 capsule (1,000 Units total) by mouth daily.  90 capsule  3  . fish oil-omega-3 fatty acids 1000 MG capsule Take 1 g by mouth daily.        . Flaxseed, Linseed, (FLAX SEED OIL) 1000 MG CAPS Take by mouth daily.        . furosemide (LASIX) 20 MG tablet Take 2 tablets (40 mg total) by mouth daily.  180 tablet  3  .  levothyroxine (SYNTHROID, LEVOTHROID) 50 MCG tablet Take 50 mcg by mouth. Pt states taking 50mg  Tuesday, Thursday, Friday, takes 1 & 1/2 Monday, Wednesday, Friday and Sunday.       . losartan (COZAAR) 100 MG tablet Take 1 tablet (100 mg total) by mouth daily.  30 tablet  6  . Multiple Vitamin (MULTIVITAMIN) tablet Take 1 tablet by mouth daily.        . nitroGLYCERIN (NITROSTAT) 0.4 MG SL tablet Place 1 tablet (0.4 mg total) under the tongue every 5 (five) minutes as needed for chest pain.  25 tablet  6  . omeprazole (PRILOSEC) 20 MG capsule Take 1 capsule (20 mg total) by mouth daily.  90 capsule  3  . oxyCODONE (OXY IR/ROXICODONE) 5 MG immediate release tablet Take 5 mg by mouth every 4 (four) hours as needed.        . potassium chloride SA (K-DUR,KLOR-CON) 20 MEQ tablet Take 1 tablet (20 mEq total) by mouth daily.  90 tablet  3  . psyllium (METAMUCIL) 58.6 % packet Take 1 packet by mouth daily.        . vitamin C (  ASCORBIC ACID) 500 MG tablet Take 1 tablet (500 mg total) by mouth daily.  90 tablet  3  . zolpidem (AMBIEN) 5 MG tablet Take 1 tablet (5 mg total) by mouth at bedtime as needed for sleep.  30 tablet  3    Allergies  Allergen Reactions  . Penicillins      ? Rash, but unsure  . Triple Antibiotic     Family History  Problem Relation Age of Onset  . Colon cancer    . Stroke    . Cancer Other     colon  . Stroke Other     History   Social History  . Marital Status: Married    Spouse Name: N/A    Number of Children: 3  . Years of Education: N/A   Occupational History  . Retired Company secretary    Social History Main Topics  . Smoking status: Former Smoker    Quit date: 01/11/1962  . Smokeless tobacco: Not on file  . Alcohol Use: No  . Drug Use: Not on file  . Sexually Active: Not on file   Other Topics Concern  . 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    ROS ALL NEGATIVE EXCEPT THOSE NOTED IN HPI  PE  General Appearance:  well developed, well nourished in no acute distress HEENT: symmetrical face, PERRLA, good dentition  Neck: no JVD, thyromegaly, or adenopathy, trachea midline Chest: symmetric without deformity Cardiac: PMI non-displaced, RRR, normal S1, S2, aortic stenosis murmur Lung: clear to ausculation and percussion Vascular: all pulses full without bruits  Abdominal: nondistended, nontender, good bowel sounds, no HSM, no bruits Extremities: no cyanosis, clubbing or edema, no sign of DVT, no varicosities  Skin: normal color, no rashes Neuro: alert and oriented x 3, non-focal Pysch: normal affect  EKG Normal sinus rhythm, no acute ST segment changes BMET    Component Value Date/Time   NA 136 10/23/2010 0610   K 4.0 10/23/2010 0610   CL 101 10/23/2010 0610   CO2 23 10/23/2010 0610   GLUCOSE 151* 10/23/2010 0610   BUN 42* 10/23/2010 0610   CREATININE 1.86* 10/23/2010 0610   CALCIUM 9.2 10/23/2010 0610   GFRNONAA 31* 10/23/2010 0610   GFRAA 36* 10/23/2010 0610    Lipid Panel  No results found for this basename: chol, trig, hdl, cholhdl, vldl, ldlcalc    CBC    Component Value Date/Time   WBC 3.7* 01/05/2011 1545   WBC 2.0* 10/24/2010 0500   RBC 4.11* 01/05/2011 1545   RBC 2.98* 10/24/2010 0500   HGB 12.5* 01/05/2011 1545   HGB 8.8* 10/24/2010 0500   HCT 35.9* 01/05/2011 1545   HCT 26.3* 10/24/2010 0500   PLT 91* 01/05/2011 1545   PLT 57* 10/24/2010 0500   MCV 87.3 01/05/2011 1545   MCV 88.3 10/24/2010 0500   MCH 30.5 01/05/2011 1545   MCH 29.5 10/24/2010 0500   MCHC 34.9 01/05/2011 1545   MCHC 33.5 10/24/2010 0500   RDW 14.7* 01/05/2011 1545   RDW 15.9* 10/24/2010 0500   LYMPHSABS 0.7* 01/05/2011 1545   LYMPHSABS 0.3* 10/20/2010 0705   MONOABS 0.2 01/05/2011 1545   MONOABS 0.2 10/20/2010 0705   EOSABS 0.0 01/05/2011 1545   EOSABS 0.0 10/20/2010 0705   BASOSABS 0.0 01/05/2011 1545   BASOSABS 0.0 10/20/2010 0705

## 2011-01-12 ENCOUNTER — Other Ambulatory Visit: Payer: Self-pay | Admitting: Nurse Practitioner

## 2011-01-12 ENCOUNTER — Emergency Department (HOSPITAL_COMMUNITY): Payer: Medicare Other

## 2011-01-12 ENCOUNTER — Ambulatory Visit (HOSPITAL_BASED_OUTPATIENT_CLINIC_OR_DEPARTMENT_OTHER): Payer: Medicare Other | Admitting: Nurse Practitioner

## 2011-01-12 ENCOUNTER — Other Ambulatory Visit: Payer: Medicare Other | Admitting: Lab

## 2011-01-12 ENCOUNTER — Encounter (HOSPITAL_COMMUNITY): Payer: Self-pay | Admitting: *Deleted

## 2011-01-12 ENCOUNTER — Emergency Department (INDEPENDENT_AMBULATORY_CARE_PROVIDER_SITE_OTHER): Payer: Medicare Other

## 2011-01-12 ENCOUNTER — Emergency Department (HOSPITAL_COMMUNITY)
Admission: EM | Admit: 2011-01-12 | Discharge: 2011-01-12 | Disposition: A | Discharge status: Home / Self Care | Payer: Medicare Other | Attending: Emergency Medicine | Admitting: Emergency Medicine

## 2011-01-12 ENCOUNTER — Other Ambulatory Visit: Payer: Self-pay

## 2011-01-12 ENCOUNTER — Emergency Department (HOSPITAL_COMMUNITY)
Admission: EM | Admit: 2011-01-12 | Discharge: 2011-01-12 | Disposition: A | Discharge status: Nursing Home (Includes ICF) | Payer: Medicare Other | Source: Home / Self Care

## 2011-01-12 DIAGNOSIS — D6949 Other primary thrombocytopenia: Secondary | ICD-10-CM

## 2011-01-12 DIAGNOSIS — Z79899 Other long term (current) drug therapy: Secondary | ICD-10-CM | POA: Insufficient documentation

## 2011-01-12 DIAGNOSIS — K219 Gastro-esophageal reflux disease without esophagitis: Secondary | ICD-10-CM | POA: Insufficient documentation

## 2011-01-12 DIAGNOSIS — R079 Chest pain, unspecified: Secondary | ICD-10-CM

## 2011-01-12 DIAGNOSIS — D696 Thrombocytopenia, unspecified: Secondary | ICD-10-CM

## 2011-01-12 DIAGNOSIS — I350 Nonrheumatic aortic (valve) stenosis: Secondary | ICD-10-CM

## 2011-01-12 DIAGNOSIS — I10 Essential (primary) hypertension: Secondary | ICD-10-CM | POA: Insufficient documentation

## 2011-01-12 DIAGNOSIS — I359 Nonrheumatic aortic valve disorder, unspecified: Secondary | ICD-10-CM

## 2011-01-12 DIAGNOSIS — Z8679 Personal history of other diseases of the circulatory system: Secondary | ICD-10-CM | POA: Insufficient documentation

## 2011-01-12 DIAGNOSIS — R1013 Epigastric pain: Secondary | ICD-10-CM | POA: Insufficient documentation

## 2011-01-12 DIAGNOSIS — E119 Type 2 diabetes mellitus without complications: Secondary | ICD-10-CM | POA: Insufficient documentation

## 2011-01-12 DIAGNOSIS — M549 Dorsalgia, unspecified: Secondary | ICD-10-CM | POA: Insufficient documentation

## 2011-01-12 LAB — LIPASE, BLOOD: Lipase: 38 U/L (ref 11–59)

## 2011-01-12 LAB — COMPREHENSIVE METABOLIC PANEL
Albumin: 3.9 g/dL (ref 3.5–5.2)
Alkaline Phosphatase: 65 U/L (ref 39–117)
BUN: 28 mg/dL — ABNORMAL HIGH (ref 6–23)
CO2: 23 mEq/L (ref 19–32)
Chloride: 99 mEq/L (ref 96–112)
GFR calc Af Amer: 55 mL/min — ABNORMAL LOW (ref >90)
GFR calc non Af Amer: 48 mL/min — ABNORMAL LOW (ref >90)
Glucose, Bld: 138 mg/dL — ABNORMAL HIGH (ref 70–99)
Potassium: 4.6 mEq/L (ref 3.5–5.1)
Total Bilirubin: 0.7 mg/dL (ref 0.3–1.2)

## 2011-01-12 LAB — URINALYSIS, ROUTINE W REFLEX MICROSCOPIC
Bilirubin Urine: NEGATIVE
Hgb urine dipstick: NEGATIVE
Ketones, ur: NEGATIVE mg/dL
Specific Gravity, Urine: 1.011 (ref 1.005–1.030)
Urobilinogen, UA: 0.2 mg/dL (ref 0.0–1.0)

## 2011-01-12 LAB — CBC WITH DIFFERENTIAL/PLATELET
BASO%: 0 % (ref 0.0–2.0)
EOS%: 0.6 % (ref 0.0–7.0)
MCH: 29.9 pg (ref 27.2–33.4)
MCHC: 34.4 g/dL (ref 32.0–36.0)
MCV: 86.9 fL (ref 79.3–98.0)
MONO%: 5.2 % (ref 0.0–14.0)
NEUT%: 85 % — ABNORMAL HIGH (ref 39.0–75.0)
RDW: 14.7 % — ABNORMAL HIGH (ref 11.0–14.6)
lymph#: 0.4 10*3/uL — ABNORMAL LOW (ref 0.9–3.3)

## 2011-01-12 MED ORDER — ROMIPLOSTIM 250 MCG ~~LOC~~ SOLR
85.0000 ug | Freq: Once | SUBCUTANEOUS | Status: AC
  Administered 2011-01-12: 85 ug via SUBCUTANEOUS
  Filled 2011-01-12: qty 0.17

## 2011-01-12 MED ORDER — SODIUM CHLORIDE 0.9 % IV SOLN: INTRAVENOUS | Status: DC

## 2011-01-12 MED ORDER — OXYCODONE-ACETAMINOPHEN 5-325 MG PO TABS
2.0000 | ORAL_TABLET | Freq: Once | ORAL | Status: AC
  Administered 2011-01-12: 2 via ORAL
  Filled 2011-01-12 (×2): qty 1

## 2011-01-12 MED ORDER — MORPHINE SULFATE 2 MG/ML IJ SOLN
2.0000 mg | Freq: Once | INTRAMUSCULAR | Status: AC
  Administered 2011-01-12: 2 mg via INTRAVENOUS
  Filled 2011-01-12: qty 1

## 2011-01-12 MED ORDER — FENTANYL CITRATE 0.05 MG/ML IJ SOLN
25.0000 ug | Freq: Once | INTRAMUSCULAR | Status: AC
  Administered 2011-01-12: 25 ug via INTRAVENOUS
  Filled 2011-01-12: qty 2

## 2011-01-12 MED ORDER — OXYCODONE-ACETAMINOPHEN 5-325 MG PO TABS: 1.0000 | ORAL_TABLET | ORAL | Status: AC | PRN

## 2011-01-12 MED ORDER — MORPHINE SULFATE 4 MG/ML IJ SOLN
4.0000 mg | Freq: Once | INTRAMUSCULAR | Status: AC
  Administered 2011-01-12: 4 mg via INTRAVENOUS
  Filled 2011-01-12 (×2): qty 1

## 2011-01-12 MED ORDER — IOHEXOL 300 MG/ML  SOLN
100.0000 mL | Freq: Once | INTRAMUSCULAR | Status: AC | PRN
  Administered 2011-01-12: 100 mL via INTRAVENOUS

## 2011-01-12 NOTE — ED Notes (Signed)
Clarification about the pts trip to [facility] today.  The pt apparently had an injection to  Promote platelet formation instead of being transfused with platelets

## 2011-01-12 NOTE — ED Notes (Signed)
Pt  Reports   Upper  Back  And  Chest  Pain              Which  Started  Yesterday        He  Reports  He  Had  Chest  Pain  Several  Days  Ago           And  Went  To  Cardiologist             -  He  Reports  The   Chest  Pain     Has  Subsided  Now  But  Was  prevalant  Earlier  Today  He  Reports      That    He  Received  platelt  Injection  Earlier  Today     At  [facility]  To  Build  Up  His  Platelets  For  Impending  Surgery

## 2011-01-12 NOTE — ED Notes (Signed)
The pt continues to have pain

## 2011-01-12 NOTE — ED Notes (Signed)
The pt continues to have pain in his lower back.  Requesting water- given

## 2011-01-12 NOTE — ED Notes (Signed)
The pt [facility] with back pain with radiation to the chest for the past 2 days.  Worse today.  He saw dr wall for the same 2 days ago.  He was at [facility] long this am and was transfused with plateletts.  He was told to go to [facility] about his back pain..  The pt is scheduled for surgery at [facility]  Jan 15 to repair an aortic valve stenosis.  The pts brother has also died today not sure if the pt knows .  The wife knows

## 2011-01-12 NOTE — ED Notes (Signed)
The pt has returned from c-t sleeping at present.

## 2011-01-12 NOTE — ED Notes (Signed)
Pt c/o his feet burning ice pack given to use or not his choice.  He just wanted to try it

## 2011-01-12 NOTE — ED Provider Notes (Signed)
Medical screening examination/treatment/procedure(s) were performed by non-physician practitioner and as supervising physician I was immediately available for consultation/collaboration.  Raynald Blend, MD 01/12/11 1729

## 2011-01-12 NOTE — ED Notes (Signed)
Pt  Placed  On  Cardiac    Monitor       Nasal  o2  At  2 l  /  Min     Reports  phined to   [employer]  Nurse  Er

## 2011-01-12 NOTE — ED Provider Notes (Signed)
History     CSN: [id number]  Arrival date & time 01/12/11  1146   None     Chief Complaint  Patient presents with  . Back Pain    (Consider location/radiation/quality/duration/timing/severity/associated sxs/prior treatment) HPI Comments: [name] presents with c/o Rt back back - onset approx 36hrs ago, and chest pain. The back pain is continuous and he states is severe. Unlike back pain he has had previously associated with DDD. He denies injury. Pain is not worsened or relieved by movement or certain positions. Pt states the pain is sharp, and feels like leaning on a knot. He took an aleve without improvement. He also continues to have intermittent Rt inferior sternal border chest pain. This comes intermittently throughout the day, is sharp, and usually lasts only for a short period of time. He discussed this with Dr Daleen Squibb 2 days ago. He has severe aortic stenosis and is scheduled for valve replacement on 01-23-11.  No dysnea, indigestion, nausea or vomiting. He is uncertain what medications he is taking.   The history is provided by the patient and the spouse.    Past Medical History  Diagnosis Date  . Anticoagulant long-term use   . Stroke   . Campath-induced atrial fibrillation   . Bradycardia   . Aortic stenosis     Echo 6/11: mild LVH, EF 60-65%, Mod to severe AS (indexed AVA 0.74 sq. cm; mean 37 mmHg, MAD, mod LAE)  . Hypertension   . Diabetes mellitus   . Anemia   . Action tremor   . Thrombocytopenia   . Throat cancer     s/p resection  . Colon polyp   . Anxiety   . Splenomegaly   . Gout   . GERD (gastroesophageal reflux disease)   . Hypothyroidism   . Leukopenia     Chronic pancytopenia  . Degenerative disc disease   . Edema   . Abrasion or friction burn of other, multiple, and unspecified sites, without mention of infection   . Joint effusion, knee     left knee  . Multiple and unspecified open wound of lower limb, complicated   . Pain in limb   . Cellulitis  and abscess of unspecified site   . Synovial cyst of popliteal space   . Cellulitis of left leg 10/11-16/2012    Past Surgical History  Procedure Date  . Cholecystectomy   . Joint effusion     left knee    Family History  Problem Relation Age of Onset  . Colon cancer    . Stroke    . Cancer Other     colon  . Stroke Other     History  Substance Use Topics  . Smoking status: Former Smoker    Quit date: 01/11/1962  . Smokeless tobacco: Not on file  . Alcohol Use: No      Review of Systems  Constitutional: Negative for fever and chills.  Respiratory: Negative for cough, shortness of breath and wheezing.   Cardiovascular: Positive for chest pain. Negative for palpitations.  Musculoskeletal: Positive for back pain.    Allergies  Penicillins and Triple antibiotic  Home Medications   Current Outpatient Rx  Name Route Sig Dispense Refill  . AMIODARONE HCL 200 MG PO TABS Oral Take 1 tablet (200 mg total) by mouth daily. 90 tablet 3  . ASPIRIN 325 MG PO TABS Oral Take 325 mg by mouth daily.      [name] Kitchen VITAMIN D 1000 UNITS PO CAPS Oral  Take 1 capsule (1,000 Units total) by mouth daily. 90 capsule 3  . OMEGA-3 FATTY ACIDS 1000 MG PO CAPS Oral Take 1 g by mouth daily.      [name] Kitchen FLAX SEED OIL 1000 MG PO CAPS Oral Take by mouth daily.      . FUROSEMIDE 20 MG PO TABS Oral Take 2 tablets (40 mg total) by mouth daily. 180 tablet 3  . LEVOTHYROXINE SODIUM 50 MCG PO TABS Oral Take 50 mcg by mouth. Pt states taking 50mg  Tuesday, Thursday, Friday, takes 1 & 1/2 Monday, Wednesday, Friday and Sunday.     [name] Kitchen LOSARTAN POTASSIUM 100 MG PO TABS Oral Take 1 tablet (100 mg total) by mouth daily. 30 tablet 6  . ONE-DAILY MULTI VITAMINS PO TABS Oral Take 1 tablet by mouth daily.      [name] Kitchen NITROGLYCERIN 0.4 MG SL SUBL Sublingual Place 1 tablet (0.4 mg total) under the tongue every 5 (five) minutes as needed for chest pain. 25 tablet 6  . OMEPRAZOLE 20 MG PO CPDR Oral Take 1 capsule (20 mg total) by mouth  daily. 90 capsule 3  . OXYCODONE HCL 5 MG PO TABS Oral Take 5 mg by mouth every 4 (four) hours as needed.      [name] Kitchen POTASSIUM CHLORIDE CRYS ER 20 MEQ PO TBCR Oral Take 1 tablet (20 mEq total) by mouth daily. 90 tablet 3  . PSYLLIUM 58.6 % PO PACK Oral Take 1 packet by mouth daily.      [name] Kitchen VITAMIN C 500 MG PO TABS Oral Take 1 tablet (500 mg total) by mouth daily. 90 tablet 3  . ZOLPIDEM TARTRATE 5 MG PO TABS Oral Take 1 tablet (5 mg total) by mouth at bedtime as needed for sleep. 30 tablet 3    BP 155/72  Pulse 74  Temp(Src) 97.6 F (36.4 C) (Oral)  Resp 22  SpO2 100%  Physical Exam  Nursing note and vitals reviewed. Constitutional: He appears well-developed and well-nourished. No distress.  HENT:  Head: Normocephalic and atraumatic.  Right Ear: Tympanic membrane, external ear and ear canal normal.  Left Ear: Tympanic membrane, external ear and ear canal normal.  Nose: Nose normal.  Mouth/Throat: Uvula is midline, oropharynx is clear and moist and mucous membranes are normal. No oropharyngeal exudate, posterior oropharyngeal edema or posterior oropharyngeal erythema.  Neck: Neck supple.  Cardiovascular: Normal rate, regular rhythm and normal heart sounds.   Pulmonary/Chest: Effort normal and breath sounds normal. No respiratory distress. He exhibits no tenderness.  Abdominal: Soft. Bowel sounds are normal. He exhibits no distension, no pulsatile midline mass and no mass. There is no hepatosplenomegaly. There is no tenderness.  Musculoskeletal:       Thoracic back: He exhibits no tenderness, no bony tenderness, no swelling and no spasm.       Lumbar back: He exhibits no tenderness, no bony tenderness, no swelling and no spasm.  Lymphadenopathy:    He has no cervical adenopathy.  Neurological: He is alert.  Skin: Skin is warm and dry.  Psychiatric: He has a normal mood and affect.    ED Course  Procedures (including critical care time)  Labs Reviewed - No data to display Dg Chest 2  View  01/12/2011  *RADIOLOGY REPORT*  Clinical Data: Chest pain radiating to the right back  CHEST - 2 VIEW  Comparison: Portable chest x-ray of 10/20/2010  Findings: The lungs are clear although hyperaerated.  No infiltrate or effusion is seen.  The heart is mildly  enlarged.  There are calcified mediastinal and hilar nodes consist with prior granulomatous disease.  No bony abnormality is seen.  IMPRESSION:  1.  Calcified nodes could be consistent with prior granulomatous disease.  No active process. 2.  Cardiomegaly. Hyperaeration.  Original Report Authenticated By: Juline Patch, M.D.     1. Chest pain   2. Back pain   3. Aortic stenosis   4. Hypertension   5. Thrombocytopenia       MDM  CXR - no mediastinal widening, unchanged compared to previous. EKG - NSR rate 72. Lt axis deviation, minimal voltage criteria for LVH. Unchanged compared to EKG 01-10-11. Pt transferred to [facility] for further care & evaluation.  Patients previous records viewed, including 01-10-11 & 13-31-12 OV Dr Daleen Squibb. Discussed case with Dr Ladon Applebaum prior to transfer who agreed with plan.         Melody Comas, [place] 01/12/11 1528

## 2011-01-12 NOTE — ED Notes (Signed)
Pt waiting for c-t.  Pain in his back continues

## 2011-01-12 NOTE — ED Notes (Signed)
The pts pain is not much better just n=making him sleepy

## 2011-01-12 NOTE — ED Provider Notes (Signed)
History     CSN: [id number]  Arrival date & time 01/12/11  1608   First MD Initiated Contact with Patient 01/12/11 1637      Chief Complaint  Patient presents with  . Back Pain    (Consider location/radiation/quality/duration/timing/severity/associated sxs/prior treatment) Patient is a 78 y.o. male presenting with back pain. The history is provided by the patient. No language interpreter was used.  Back Pain  This is a new problem. The current episode started yesterday. The problem occurs constantly. The problem has not changed since onset.The pain is associated with no known injury. The pain is present in the thoracic spine. The quality of the pain is described as burning. Radiates to: epigastrum. The pain is at a severity of 7/10. The pain is moderate. The pain is the same all the time. Associated symptoms include chest pain (occasional lower chest pain) and abdominal pain (occaSional mild epigastric pain). Pertinent negatives include no fever, no headaches, no abdominal swelling and no bowel incontinence. He has tried NSAIDs, ice and heat for the symptoms. The treatment provided no relief.    Past Medical History  Diagnosis Date  . Anticoagulant long-term use   . Stroke   . Campath-induced atrial fibrillation   . Bradycardia   . Aortic stenosis     Echo 6/11: mild LVH, EF 60-65%, Mod to severe AS (indexed AVA 0.74 sq. cm; mean 37 mmHg, MAD, mod LAE)  . Hypertension   . Diabetes mellitus   . Anemia   . Action tremor   . Thrombocytopenia   . Throat cancer     s/p resection  . Colon polyp   . Anxiety   . Splenomegaly   . Gout   . GERD (gastroesophageal reflux disease)   . Hypothyroidism   . Leukopenia     Chronic pancytopenia  . Degenerative disc disease   . Edema   . Abrasion or friction burn of other, multiple, and unspecified sites, without mention of infection   . Joint effusion, knee     left knee  . Multiple and unspecified open wound of lower limb, complicated     . Pain in limb   . Cellulitis and abscess of unspecified site   . Synovial cyst of popliteal space   . Cellulitis of left leg 10/11-16/2012    Past Surgical History  Procedure Date  . Cholecystectomy   . Joint effusion     left knee    Family History  Problem Relation Age of Onset  . Colon cancer    . Stroke    . Cancer Other     colon  . Stroke Other     History  Substance Use Topics  . Smoking status: Former Smoker    Quit date: 01/11/1962  . Smokeless tobacco: Not on file  . Alcohol Use: No      Review of Systems  Constitutional: Negative for fever and chills.  Cardiovascular: Positive for chest pain (occasional lower chest pain).  Gastrointestinal: Positive for abdominal pain (occaSional mild epigastric pain). Negative for bowel incontinence.  Musculoskeletal: Positive for back pain.  Neurological: Negative for headaches.  All other systems reviewed and are negative.    Allergies  Penicillins and Triple antibiotic  Home Medications   Current Outpatient Rx  Name Route Sig Dispense Refill  . AMIODARONE HCL 200 MG PO TABS Oral Take 1 tablet (200 mg total) by mouth daily. 90 tablet 3  . ASPIRIN 325 MG PO TABS Oral Take 325 mg by  mouth daily.      [name] Kitchen VITAMIN D 1000 UNITS PO CAPS Oral Take 1 capsule (1,000 Units total) by mouth daily. 90 capsule 3  . OMEGA-3 FATTY ACIDS 1000 MG PO CAPS Oral Take 1 g by mouth daily.      [name] Kitchen FLAX SEED OIL 1000 MG PO CAPS Oral Take 1 capsule by mouth daily.     . FUROSEMIDE 20 MG PO TABS Oral Take 2 tablets (40 mg total) by mouth daily. 180 tablet 3  . LEVOTHYROXINE SODIUM 50 MCG PO TABS Oral Take 50 mcg by mouth. Pt states taking 50mg  Tuesday, Thursday, Friday, takes 1 & 1/2 Monday, Wednesday, Friday and Sunday.     [name] Kitchen LOSARTAN POTASSIUM 100 MG PO TABS Oral Take 1 tablet (100 mg total) by mouth daily. 30 tablet 6  . ONE-DAILY MULTI VITAMINS PO TABS Oral Take 1 tablet by mouth daily.      [name] Kitchen NITROGLYCERIN 0.4 MG SL SUBL Sublingual  Place 1 tablet (0.4 mg total) under the tongue every 5 (five) minutes as needed for chest pain. 25 tablet 6  . OMEPRAZOLE 20 MG PO CPDR Oral Take 1 capsule (20 mg total) by mouth daily. 90 capsule 3  . OXYCODONE HCL 5 MG PO TABS Oral Take 5 mg by mouth every 4 (four) hours as needed. For pain    . POTASSIUM CHLORIDE CRYS ER 20 MEQ PO TBCR Oral Take 1 tablet (20 mEq total) by mouth daily. 90 tablet 3  . PSYLLIUM 58.6 % PO PACK Oral Take 1 packet by mouth daily.      [name] Kitchen VITAMIN C 500 MG PO TABS Oral Take 1 tablet (500 mg total) by mouth daily. 90 tablet 3  . ZOLPIDEM TARTRATE 5 MG PO TABS Oral Take 1 tablet (5 mg total) by mouth at bedtime as needed for sleep. 30 tablet 3    BP 157/66  Pulse 86  Temp(Src) 97.6 F (36.4 C) (Oral)  Resp 18  SpO2 100%  Physical Exam  Nursing note and vitals reviewed. Constitutional: He is oriented to person, place, and time. He appears well-developed and well-nourished. No distress.  HENT:  Head: Normocephalic and atraumatic.  Mouth/Throat: No oropharyngeal exudate.  Eyes: EOM are normal. Pupils are equal, round, and reactive to light.  Neck: Normal range of motion. Neck supple.  Cardiovascular: Normal rate and regular rhythm.  Exam reveals no friction rub.   No murmur heard. Pulmonary/Chest: Effort normal and breath sounds normal. No respiratory distress. He has no wheezes. He has no rales.  Abdominal: He exhibits no distension. There is no tenderness. There is no rebound.  Musculoskeletal: Normal range of motion. He exhibits no edema.       Thoracic back: He exhibits tenderness (Around T10, R sided paraspinal muscles). He exhibits no bony tenderness, no swelling, no edema, no deformity, no laceration and no spasm.  Neurological: He is alert and oriented to person, place, and time.  Skin: He is not diaphoretic.    ED Course  Procedures (including critical care time)  Labs Reviewed  COMPREHENSIVE METABOLIC PANEL - Abnormal; Notable for the following:     Sodium 134 (*)    Glucose, Bld 138 (*)    BUN 28 (*)    GFR calc non Af Amer 48 (*)    GFR calc Af Amer 55 (*)    All other components within normal limits  LIPASE, BLOOD  TROPONIN I  URINALYSIS, ROUTINE W REFLEX MICROSCOPIC   Dg Chest 2 View  01/12/2011  *RADIOLOGY REPORT*  Clinical Data: Chest pain radiating to the right back  CHEST - 2 VIEW  Comparison: Portable chest x-ray of 10/20/2010  Findings: The lungs are clear although hyperaerated.  No infiltrate or effusion is seen.  The heart is mildly enlarged.  There are calcified mediastinal and hilar nodes consist with prior granulomatous disease.  No bony abnormality is seen.  IMPRESSION:  1.  Calcified nodes could be consistent with prior granulomatous disease.  No active process. 2.  Cardiomegaly. Hyperaeration.  Original Report Authenticated By: Juline Patch, M.D.   Ct Angio Chest W/cm &/or Wo Cm  01/12/2011  *RADIOLOGY REPORT*  Clinical Data:  78 year old male with chest pain.  CT ANGIOGRAPHY CHEST WITH CONTRAST  Technique:  Multidetector CT imaging of the chest was performed using the standard protocol during bolus administration of intravenous contrast.  Multiplanar CT image reconstructions including MIPs were obtained to evaluate the vascular anatomy.  Contrast: OMNIPAQUE IOHEXOL 300 MG/ML IV SOLN  Comparison:  01/12/2011 and prior chest radiographs.  06/14/2007 abdominal CT.  Findings:  This is a technically satisfactory study.  No pulmonary emboli are identified. Ectasia of the thoracic aorta is noted with the ascending aorta measuring 3.6 cm and the descending aorta measuring 3.2 cm in greatest diameter.  Cardiomegaly is identified with moderate to heavy coronary artery and aortic valvular calcifications. Calcified mediastinal/hilar lymph nodes and pulmonary nodules are compatible with prior granulomatous disease. There is no evidence of suspicious nodule, mass, airspace disease, consolidation or pneumothorax. A 5 x 15 mm low  density soft tissue structure in the proximal right mainstem bronchus probably represents mucus. Scattered blebs within both lungs are noted.  No acute or suspicious bony abnormalities are noted. Marked splenomegaly is again identified.  Review of the MIP images confirms the above findings.  IMPRESSION: No evidence of acute abnormality - no evidence of pulmonary emboli.  Ectasia of the thoracic aorta without aneurysm.  Cardiomegaly, coronary artery disease and heavy aortic valvular calcifications.  Unchanged marked splenomegaly.  Original Report Authenticated By: Rosendo Gros, M.D.     1. Back pain       MDM  78 year old male presents with back pain. Patient was seen at urgent care earlier today for the same complaint and was sent here after a chest x-ray and EKG were performed. [name] back pain has been present since yesterday and has been constant. No alleviating or exacerbating factors. Pain located around T10 to the right of the spine. No bony tenderness. No masses appreciated. No rash appreciated. Occasional chest pain which is not relieved by nitroglycerin. Occasional epigastric tenderness which radiates from his back.  Was seen 2 days ago by his cardiologist for his chest/epigastric pain. His cardiologist is not concerned that this is cardiac in etiology. Patient does have aortic valve replacement scheduled in 11 days and is receiving shots to help a chronic thrombocytopenia for his surgery.  Patient denies dyspnea, cough, headache, vision changes, nausea, vomiting, dysuria, hematuria. On exam vitals are stable. No masses appreciated in his back. No bony tenderness.  No CVA tenderness. Lungs are clear bilaterally. Equal pulses in lower extremities and upper extremities. Mild epigastric tenderness without rebound or guarding. Reviewed chest x-ray done earlier today at urgent care; it shows no widened mediastinum and chronic cardiomegaly. Will check basic labs including troponin, LFTs, and  lipase. Labs normal, normal troponin, urine normal. LFTs and lipase normal. CT of chest ordered since still having pain and unsure of  etiology of pain. CT normal and reviewed by me,  no acute dissection. Read as above. Patient still in pain which is most likely musculoskeletal. Patient given prescription for Percocet and instructed to follow up with cardiology and PCP next week. Patient sure he has good followup since he is having valve replacement surgery within the next 2 weeks  Elwin Mocha, MD 01/12/11 2318

## 2011-01-14 NOTE — ED Provider Notes (Signed)
I saw and evaluated the patient, reviewed the resident's note and I agree with the findings and plan.   Date: 01/14/2011  Rate: 72   Rhythm: normal sinus rhythm  QRS Axis: left  Intervals: normal  ST/T Wave abnormalities: normal  Conduction Disutrbances:none  Narrative Interpretation: LAD, NSR  CP x >2 days, now R paraspinal thoracic pain. RRR, pain not reproducible on exam. CTA unremarkable for acute cause of pain. Doubt ACS and was seen by cardiology at onset of chest pain which they did not believe to be cardiac.Pain control, home      Forbes Cellar, MD 01/14/11 2307

## 2011-01-19 ENCOUNTER — Other Ambulatory Visit (HOSPITAL_BASED_OUTPATIENT_CLINIC_OR_DEPARTMENT_OTHER): Payer: Medicare Other

## 2011-01-19 ENCOUNTER — Ambulatory Visit (HOSPITAL_BASED_OUTPATIENT_CLINIC_OR_DEPARTMENT_OTHER): Payer: Medicare Other

## 2011-01-19 DIAGNOSIS — D6949 Other primary thrombocytopenia: Secondary | ICD-10-CM

## 2011-01-19 DIAGNOSIS — D696 Thrombocytopenia, unspecified: Secondary | ICD-10-CM

## 2011-01-19 LAB — CBC WITH DIFFERENTIAL/PLATELET
Basophils Absolute: 0 10*3/uL (ref 0.0–0.1)
Eosinophils Absolute: 0 10*3/uL (ref 0.0–0.5)
LYMPH%: 16.2 % (ref 14.0–49.0)
MCV: 85.8 fL (ref 79.3–98.0)
MONO%: 4.4 % (ref 0.0–14.0)
NEUT#: 2.6 10*3/uL (ref 1.5–6.5)
Platelets: 110 10*3/uL — ABNORMAL LOW (ref 140–400)
RBC: 4.15 10*6/uL — ABNORMAL LOW (ref 4.20–5.82)

## 2011-01-19 MED ORDER — ROMIPLOSTIM 250 MCG ~~LOC~~ SOLR
85.0000 ug | Freq: Once | SUBCUTANEOUS | Status: AC
  Administered 2011-01-19: 85 ug via SUBCUTANEOUS
  Filled 2011-01-19: qty 0.17

## 2011-01-23 HISTORY — PX: AORTIC VALVE REPLACEMENT: SHX41

## 2011-01-30 ENCOUNTER — Ambulatory Visit: Payer: Medicare Other | Admitting: Oncology

## 2011-01-30 ENCOUNTER — Other Ambulatory Visit: Payer: Medicare Other | Admitting: Lab

## 2011-02-15 ENCOUNTER — Telehealth: Payer: Self-pay | Admitting: Cardiology

## 2011-02-15 NOTE — Telephone Encounter (Signed)
Pt is post AVR replacement on 01/23/11.  He is calling to see Dr. Daleen Squibb sooner.  He said his surgery went well. Except he is in atrial fib and on medication.  His staples were removed today.  He has been retaining fluid but is on medication for this. Denies shortness of breath. He is agreeable to see Tereso Newcomer PA when Dr. Daleen Squibb is here. His wife will bring updated medication list. They will need refills.  She also said their handicapped placard is running out and will need a new one.  She cannot find the form she got from the [facility]. I have form for pt. Appt made for 02/23/11 with Lorin Picket at 9:50am. Mylo Red RN

## 2011-02-15 NOTE — Telephone Encounter (Signed)
New msg: pt wife calling wanting to know if pt can see MD ASAP. Pt would rather not see PA.  Pt heart is out of rhythm and wanted to see MD sooner than pt scheduled appt.   Please return pt call to discuss further.

## 2011-02-22 ENCOUNTER — Other Ambulatory Visit: Payer: Self-pay | Admitting: *Deleted

## 2011-02-22 ENCOUNTER — Telehealth: Payer: Self-pay | Admitting: *Deleted

## 2011-02-22 DIAGNOSIS — D696 Thrombocytopenia, unspecified: Secondary | ICD-10-CM

## 2011-02-22 NOTE — Telephone Encounter (Signed)
Call from pt's wife requesting sooner appt with Dr. [name]. Pt had valve replaced 01/23/11. Reviewed with Dr. [name]: Order received for CBC on 2/15, keep same office appt. Attempted to return call with this info. Busy signal. Order sent to schedulers for lab appt.

## 2011-02-23 ENCOUNTER — Telehealth: Payer: Self-pay | Admitting: Physician Assistant

## 2011-02-23 ENCOUNTER — Encounter: Payer: Self-pay | Admitting: Physician Assistant

## 2011-02-23 ENCOUNTER — Telehealth: Payer: Self-pay | Admitting: Oncology

## 2011-02-23 ENCOUNTER — Ambulatory Visit (INDEPENDENT_AMBULATORY_CARE_PROVIDER_SITE_OTHER): Payer: Medicare Other | Admitting: Physician Assistant

## 2011-02-23 VITALS — BP 140/80 | HR 75 | Ht 70.0 in | Wt 180.0 lb

## 2011-02-23 DIAGNOSIS — I10 Essential (primary) hypertension: Secondary | ICD-10-CM

## 2011-02-23 DIAGNOSIS — I359 Nonrheumatic aortic valve disorder, unspecified: Secondary | ICD-10-CM

## 2011-02-23 DIAGNOSIS — I5032 Chronic diastolic (congestive) heart failure: Secondary | ICD-10-CM

## 2011-02-23 DIAGNOSIS — D6949 Other primary thrombocytopenia: Secondary | ICD-10-CM

## 2011-02-23 DIAGNOSIS — I4891 Unspecified atrial fibrillation: Secondary | ICD-10-CM

## 2011-02-23 DIAGNOSIS — I509 Heart failure, unspecified: Secondary | ICD-10-CM

## 2011-02-23 DIAGNOSIS — Z952 Presence of prosthetic heart valve: Secondary | ICD-10-CM

## 2011-02-23 LAB — CBC WITH DIFFERENTIAL/PLATELET
Basophils Absolute: 0 10*3/uL (ref 0.0–0.1)
Lymphocytes Relative: 15.1 % (ref 12.0–46.0)
Monocytes Relative: 8.2 % (ref 3.0–12.0)
Platelets: 94 10*3/uL — ABNORMAL LOW (ref 150.0–400.0)
RDW: 16.2 % — ABNORMAL HIGH (ref 11.5–14.6)

## 2011-02-23 LAB — BASIC METABOLIC PANEL
BUN: 26 mg/dL — ABNORMAL HIGH (ref 6–23)
Calcium: 9 mg/dL (ref 8.4–10.5)
GFR: 47.79 mL/min — ABNORMAL LOW (ref >60.00)
Glucose, Bld: 111 mg/dL — ABNORMAL HIGH (ref 70–99)
Sodium: 138 mEq/L (ref 135–145)

## 2011-02-23 NOTE — Assessment & Plan Note (Signed)
Maintaining NSR.  He is not a coumadin candidate due to his thrombocytopenia.  He remains on ASA.  Patient also seen with Dr. Valera Castle today.  We will restart his amiodarone.

## 2011-02-23 NOTE — Assessment & Plan Note (Signed)
Borderline control.  Continue current medications for now.  Adjust lasix as noted.

## 2011-02-23 NOTE — Telephone Encounter (Signed)
Spoke with pt he is aware it was Amiodarone

## 2011-02-23 NOTE — Assessment & Plan Note (Signed)
Adjust diuretics as noted.  He is doing a great job monitoring his weights.

## 2011-02-23 NOTE — Telephone Encounter (Signed)
pt number was busy on 02/14 until 02/15 @ 4pm.  scheduled lab appt on 02/18

## 2011-02-23 NOTE — Assessment & Plan Note (Signed)
S/p tissue AVR with Dr. Silvestre Mesi at [facility] by right thoracotomy.  Doing ok.  Will refer to cardiac rehab.  Still looks a little volume overloaded.  We thought he was on Lasix 20 mg and wanted to increase to 40 mg QD.  But patient thinks he is taking 40 mg QD now.  He will check his medications when he gets home and contact us.  We can increase the dose based upon his current prescription.  Check a BMET today.  Repeat a bmet in one week.  He has had some nausea.  We asked him to stop the oxycodone as this may be causing that.  Also check a CBC today.  Refer to cardiac rehab.  Follow up with Dr. Valera Castle in one month.

## 2011-02-23 NOTE — Assessment & Plan Note (Signed)
He has follow up with Dr.Sherrill in 2 weeks. Check cbc today.

## 2011-02-23 NOTE — Patient Instructions (Signed)
Your physician recommends that you schedule a follow-up appointment in: 1 month with Dr Daleen Squibb Your physician recommends that you return for lab work in: today and in 1 week Your physician has requested that you have an echocardiogram. Echocardiography is a painless test that uses sound waves to create images of your heart. It provides your doctor with information about the size and shape of your heart and how well your heart's chambers and valves are working. This procedure takes approximately one hour. There are no restrictions for this procedure. You have been referred to Cardiac Rehab at [facility] Your physician has recommended you make the following change in your medication: RESTART Amiodarone 200 mg daily -- STOP Oxycodone

## 2011-02-23 NOTE — Progress Notes (Signed)
[address] [place], [place]  [address] Phone: [phone] Fax:  [phone]  Date:  02/23/2011   Name:  [name]       DOB:  09-20-1922 MRN:  [id number]  PCP:  Dr. Alwyn Ren Primary Cardiologist:  Dr. Valera Castle  Primary Electrophysiologist:  None    History of Present Illness: [name] is a 78 y.o. male who presents for post hospital follow up.  He has a h/o persistent atrial fibrillation, maintaining NSR on amiodarone, no longer on coumadin (due to thrombocytopenia), severe AS, HTN, DM2, hypothyroidism, GERD.  He was recently noted to develop severe AS with symptoms of angina.  Echo in 10/12 with a mean AV gradient of 60 mmHg.  He was referred to [facility].  He was not a candidate for TAVR.  He had a cath at [facility] that demonstrated no obstructive CAD.  He was followed closely by his Hematologist here (Dr. Alcide Evener) and hematology at [facility] (Dr. James Ivanoff) due to his chronic pancytopenia.    He eventually underwent tissue AVR by a right thoracotomy approach with Dr. Silvestre Mesi 01/23/11.  Post op course notable for a/c renal failure (peak creatinine of 2).  Follow up notes indicate he was in AFib and this was thought to be chronic and his amiodarone was stopped.  Patient was volume overloaded and lasix was increased.  Patient is not sure what dose of Lasix he is on now.  He is overall doing ok.  Notes some nausea and increased anxiety.  Overall breathing is ok.  Sometimes has to prop up to breath better. No PND.  Edema in legs is improved.  Has a cough that is notable for clear sputum only.  He has been walking.  Describes Class 2-2b symptoms.  Has occasional chest soreness.  No syncope.  Eager to start cardiac rehab.    Past Medical History  Diagnosis Date  . Stroke   . Atrial fibrillation     amiodarone therapy  . Bradycardia   . Aortic stenosis     echo 10/12: EF 65-70%, grade 2 diast dysfxn, severe AS, mean gradient 60 mmHg, mod LAE, PASP 47;  s/p minimally  invasive tissue AVR with Dr. Silvestre Mesi at [facility] 01/2011 (pre-AVR cath with no obs CAD)  . Hypertension   . Diabetes mellitus   . Anemia   . Action tremor   . Thrombocytopenia     Dr. [name]  . Throat cancer     s/p resection  . Colon polyp   . Anxiety   . Splenomegaly   . Gout   . GERD (gastroesophageal reflux disease)   . Hypothyroidism   . Leukopenia     Chronic pancytopenia  . Degenerative disc disease   . Joint effusion, knee     left knee  . Synovial cyst of popliteal space   . Cellulitis of left leg 10/11-16/2012  . CKD (chronic kidney disease)     Current Outpatient Prescriptions  Medication Sig Dispense Refill  . allopurinol (ZYLOPRIM) 100 MG tablet Take 100 mg by mouth Daily.      [name] Kitchen amiodarone (PACERONE) 200 MG tablet Take 1 tablet (200 mg total) by mouth daily.  90 tablet  3  . aspirin 325 MG tablet Take 325 mg by mouth daily.        . Cholecalciferol (VITAMIN D) 1000 UNITS capsule Take 1 capsule (1,000 Units total) by mouth daily.  90 capsule  3  . COLCRYS 0.6 MG tablet Take  0.6 mg by mouth Daily.      [name] Kitchen diltiazem (CARDIZEM CD) 240 MG 24 hr capsule Take 240 mg by mouth Daily.      . fish oil-omega-3 fatty acids 1000 MG capsule Take 1 g by mouth daily.        . Flaxseed, Linseed, (FLAX SEED OIL) 1000 MG CAPS Take 1 capsule by mouth daily.       . furosemide (LASIX) 20 MG tablet Take 20 mg by mouth daily.      [name] Kitchen levothyroxine (SYNTHROID, LEVOTHROID) 50 MCG tablet Take 50 mcg by mouth daily.       . metoprolol succinate (TOPROL-XL) 25 MG 24 hr tablet Take 25 mg by mouth Daily.      . Multiple Vitamin (MULTIVITAMIN) tablet Take 1 tablet by mouth daily.        . nitroGLYCERIN (NITROSTAT) 0.4 MG SL tablet Place 1 tablet (0.4 mg total) under the tongue every 5 (five) minutes as needed for chest pain.  25 tablet  6  . omeprazole (PRILOSEC) 20 MG capsule Take 1 capsule (20 mg total) by mouth daily.  90 capsule  3  . potassium chloride SA (K-DUR,KLOR-CON) 20 MEQ tablet Take 1  tablet (20 mEq total) by mouth daily.  90 tablet  3  . psyllium (METAMUCIL) 58.6 % packet Take 1 packet by mouth daily.        . vitamin C (ASCORBIC ACID) 500 MG tablet Take 1 tablet (500 mg total) by mouth daily.  90 tablet  3  . zolpidem (AMBIEN) 5 MG tablet Take 1 tablet (5 mg total) by mouth at bedtime as needed for sleep.  30 tablet  3    Allergies: Allergies  Allergen Reactions  . Penicillins      ? Rash, but unsure  . Triple Antibiotic     History  Substance Use Topics  . Smoking status: Former Smoker    Quit date: 01/11/1962  . Smokeless tobacco: Not on file  . Alcohol Use: No     ROS:  Please see the history of present illness.   All other systems reviewed and negative.   PHYSICAL EXAM: VS:  BP 140/80  Pulse 75  Ht 5\' 10"  (1.778 m)  Wt 180 lb (81.647 kg)  BMI 25.83 kg/m2 Well nourished, well developed, in no acute distress HEENT: normal Neck: no JVD Cardiac:  normal S1, S2; RRR; no murmur Chest: right thoracotomy scar well healed without erythema or discharge Lungs:  clear to auscultation bilaterally, no wheezing, rhonchi or rales Abd: soft, nontender, no hepatomegaly Ext: 1-2+ bilateeral edema, R>L Skin: warm and dry Neuro:  CNs 2-12 intact, no focal abnormalities noted  EKG:  Sinus rhythm, heart rate 75, left axis deviation, nonspecific ST-T wave changes, prolonged QT with QTc 462 ms  ASSESSMENT AND PLAN:

## 2011-02-23 NOTE — Telephone Encounter (Signed)
New msg Pt's wife called. She said they were here this morning. She wants to talk to you about which med he is to start back on. Please call

## 2011-02-26 ENCOUNTER — Telehealth: Payer: Self-pay | Admitting: *Deleted

## 2011-02-26 ENCOUNTER — Other Ambulatory Visit: Payer: Medicare Other

## 2011-02-26 DIAGNOSIS — D696 Thrombocytopenia, unspecified: Secondary | ICD-10-CM

## 2011-02-26 DIAGNOSIS — I5032 Chronic diastolic (congestive) heart failure: Secondary | ICD-10-CM

## 2011-02-26 DIAGNOSIS — D649 Anemia, unspecified: Secondary | ICD-10-CM

## 2011-02-26 LAB — CBC WITH DIFFERENTIAL/PLATELET
BASO%: 0.2 % (ref 0.0–2.0)
Basophils Absolute: 0 10*3/uL (ref 0.0–0.1)
HCT: 34.7 % — ABNORMAL LOW (ref 38.4–49.9)
HGB: 11.8 g/dL — ABNORMAL LOW (ref 13.0–17.1)
MCHC: 34 g/dL (ref 32.0–36.0)
MONO#: 0.2 10*3/uL (ref 0.1–0.9)
NEUT%: 78.6 % — ABNORMAL HIGH (ref 39.0–75.0)
WBC: 3.4 10*3/uL — ABNORMAL LOW (ref 4.0–10.3)
lymph#: 0.5 10*3/uL — ABNORMAL LOW (ref 0.9–3.3)

## 2011-02-26 NOTE — Telephone Encounter (Signed)
s/w pt and his wife about lab results and lasix /K+ dose change , repeat bmet 03/05/11 @ Dr. [name] office.  Increase lasix to 60 mg daily and increase K+ 30 meq daily per Tereso Newcomer, PA-C.  Wife asked for refills for diltiazem, colcrys and allopurinol to [facility] [address] today. [name]

## 2011-02-27 ENCOUNTER — Ambulatory Visit (INDEPENDENT_AMBULATORY_CARE_PROVIDER_SITE_OTHER): Payer: Medicare Other | Admitting: Internal Medicine

## 2011-02-27 ENCOUNTER — Encounter: Payer: Self-pay | Admitting: Internal Medicine

## 2011-02-27 VITALS — BP 136/72 | HR 71 | Temp 97.5°F | Wt 177.0 lb

## 2011-02-27 DIAGNOSIS — R110 Nausea: Secondary | ICD-10-CM

## 2011-02-27 DIAGNOSIS — G2581 Restless legs syndrome: Secondary | ICD-10-CM | POA: Insufficient documentation

## 2011-02-27 DIAGNOSIS — G4700 Insomnia, unspecified: Secondary | ICD-10-CM

## 2011-02-27 DIAGNOSIS — Z954 Presence of other heart-valve replacement: Secondary | ICD-10-CM

## 2011-02-27 DIAGNOSIS — E039 Hypothyroidism, unspecified: Secondary | ICD-10-CM

## 2011-02-27 MED ORDER — LORAZEPAM 0.5 MG PO TABS: ORAL_TABLET | ORAL | Status: DC

## 2011-02-27 MED ORDER — RANITIDINE HCL 150 MG PO TABS: 150.0000 mg | ORAL_TABLET | Freq: Two times a day (BID) | ORAL | Status: DC

## 2011-02-27 NOTE — Progress Notes (Signed)
Subjective:    Patient ID: [name], male    DOB: [birth year], 78 y.o.   MRN: [id number]  HPI He had aortic valve replacement 01/23/2011 at [facility] by Dr. Silvestre Mesi. He was in the hospital for 11 days; since return home he has had insomnia. His wife denies severe snoring; she sleeps in another room and is not sure whether he has any apnea. He states that he gets antsy and must get up and move around. He does not describe classic restless leg syndrome symptoms but describes diffuse discomfort.  Insomnia pattern: Difficulty going to sleep:yes Frequent awakening:yes if med not taken Early awakening:occasionally Nightmares:no Abnormal leg movement:no Risk factors/sleep hygiene: Stimulants:no Alcohol intake:no Reading, watching TV, eating in bed:no Daytime naps:no Stress/anxiety:some Work/travel factors:no Impact: Daytime hypersomnolence: no Motor vehicle accident/motor dysfunction:no Treatment to date/efficacy: Ambien helps      Review of Systems He denies any chest pain, palpitations, edema, or paroxysmal nocturnal dyspnea as playing a role in his sleep issues.  He describes nausea each day beginning approximately 3 PM. He denies dysphagia or severe dyspepsia.  He said some cough with white sputum but the cough does not disturb sleep. He does not have a lot of heartburn; he does have increased burping. TUMS have been of some benefit.  His weight was 191/26 and has dropped to 170. She did have fluid retention.  Blood pressures in the last 3 weeks have ranged from 102/54 to a high of 152/81.  He sees a hematologist for thrombocytopenia and has been receiving platelet transfusions.     Objective:   Physical Exam Gen.:  well-nourished in appearance. Alert, appropriate and cooperative throughout exam.  Eyes: No corneal or conjunctival inflammation noted. No icterus, lid lag or proptosis   Mouth: Oral mucosa and oropharynx reveal no lesions or exudates.  Upper plate. Neck: No deformities, masses, or tenderness noted.  Thyroid normal Lungs: Normal respiratory effort; chest expands symmetrically. Lungs are clear to auscultation without rales, wheezes, or increased work of breathing. Heart: Normal rate and rhythm. Normal S1 and S2. No gallop, click, or rub. Grade 1/6 systolic murmur  Abdomen: Bowel sounds normal; abdomen soft and nontender. No masses, organomegaly . Large ventral hernia noted.                                                                          Musculoskeletal/extremities: Trace to one half edema is noted in the extremities  Vascular: Carotid, radial artery, dorsalis pedis and  posterior tibial pulses are full and equal. There is slight transmission of the basilar murmur into the left carotid  Neurologic: Alert and oriented x3. Deep tendon reflexes symmetrical and normal.  Hand tremor         Skin: There is hyperpigmentation of the extremities particularly the forearms Lymph: No cervical, axillary lymphadenopathy present. Psych: Mood and affect are normal. Normally interactive  Assessment & Plan:   #1 insomnia; the exact etiology is unclear. This could possibly be related to low ferritin levels. Anxiety may also be a component. A trial of low-dose lorazepam at bedtime and be recommended  #2 nausea; reflux measures will be discussed. Clonidine will be prescribed.

## 2011-02-27 NOTE — Patient Instructions (Signed)
The triggers for reflux   include stress; the "aspirin family" ; alcohol; peppermint; and caffeine (coffee, tea, cola, and chocolate). The aspirin family would include aspirin and the nonsteroidal agents such as ibuprofen &  Naproxen. Tylenol would not cause reflux. If having symptoms  ; food & drink should be avoided for @ least 2 hours before going to bed.   Hold the omeprazole while taking the ranitidine 150 mg twice a day 30 minutes before breakfast and evening meal.

## 2011-03-01 ENCOUNTER — Other Ambulatory Visit: Payer: Medicare Other | Admitting: *Deleted

## 2011-03-01 ENCOUNTER — Ambulatory Visit (HOSPITAL_COMMUNITY): Payer: Medicare Other | Attending: Cardiology

## 2011-03-01 ENCOUNTER — Other Ambulatory Visit: Payer: Self-pay

## 2011-03-01 DIAGNOSIS — Z954 Presence of other heart-valve replacement: Secondary | ICD-10-CM | POA: Insufficient documentation

## 2011-03-01 DIAGNOSIS — I359 Nonrheumatic aortic valve disorder, unspecified: Secondary | ICD-10-CM

## 2011-03-01 DIAGNOSIS — E119 Type 2 diabetes mellitus without complications: Secondary | ICD-10-CM | POA: Insufficient documentation

## 2011-03-01 DIAGNOSIS — I10 Essential (primary) hypertension: Secondary | ICD-10-CM | POA: Insufficient documentation

## 2011-03-01 DIAGNOSIS — I079 Rheumatic tricuspid valve disease, unspecified: Secondary | ICD-10-CM | POA: Insufficient documentation

## 2011-03-01 DIAGNOSIS — M7989 Other specified soft tissue disorders: Secondary | ICD-10-CM | POA: Insufficient documentation

## 2011-03-02 ENCOUNTER — Telehealth: Payer: Self-pay | Admitting: Cardiology

## 2011-03-02 NOTE — Telephone Encounter (Signed)
New problem:  Per Randa Evens, order fax over 2/19 . Will re-faxed form over . To start cardiac rehab.

## 2011-03-05 ENCOUNTER — Other Ambulatory Visit (HOSPITAL_BASED_OUTPATIENT_CLINIC_OR_DEPARTMENT_OTHER): Payer: Medicare Other | Admitting: Lab

## 2011-03-05 ENCOUNTER — Telehealth: Payer: Self-pay | Admitting: Oncology

## 2011-03-05 ENCOUNTER — Ambulatory Visit (HOSPITAL_BASED_OUTPATIENT_CLINIC_OR_DEPARTMENT_OTHER): Payer: Medicare Other | Admitting: Oncology

## 2011-03-05 ENCOUNTER — Telehealth: Payer: Self-pay

## 2011-03-05 VITALS — BP 144/62 | HR 65 | Temp 97.0°F | Ht 70.0 in | Wt 175.1 lb

## 2011-03-05 DIAGNOSIS — D696 Thrombocytopenia, unspecified: Secondary | ICD-10-CM

## 2011-03-05 DIAGNOSIS — I5032 Chronic diastolic (congestive) heart failure: Secondary | ICD-10-CM

## 2011-03-05 LAB — CBC WITH DIFFERENTIAL/PLATELET
Eosinophils Absolute: 0 10*3/uL (ref 0.0–0.5)
LYMPH%: 14.9 % (ref 14.0–49.0)
MONO#: 0.2 10*3/uL (ref 0.1–0.9)
NEUT#: 3.3 10*3/uL (ref 1.5–6.5)
Platelets: 84 10*3/uL — ABNORMAL LOW (ref 140–400)
RBC: 4.3 10*6/uL (ref 4.20–5.82)
WBC: 4.2 10*3/uL (ref 4.0–10.3)
lymph#: 0.6 10*3/uL — ABNORMAL LOW (ref 0.9–3.3)

## 2011-03-05 LAB — BASIC METABOLIC PANEL
CO2: 25 mEq/L (ref 19–32)
Calcium: 9.1 mg/dL (ref 8.4–10.5)
Chloride: 104 mEq/L (ref 96–112)
Glucose, Bld: 122 mg/dL — ABNORMAL HIGH (ref 70–99)
Potassium: 3.9 mEq/L (ref 3.5–5.3)
Sodium: 140 mEq/L (ref 135–145)

## 2011-03-05 NOTE — Telephone Encounter (Signed)
APPTS MADE AND PRINTED FOR MAY AND AUG    AOM

## 2011-03-05 NOTE — Telephone Encounter (Signed)
Message copied by Maurice Small on Mon Mar 05, 2011  7:37 AM ------      Message from: Pecola Lawless      Created: Sun Mar 04, 2011  7:00 AM       TSH (Thyroid Stimulating Hormone) normal range = 0.35- 5.50. Ideal value is 1-3. A  Value below 0.35 indicates excessive thyroid function or excessive thyroid medication supplementation (HYPERthyroid state) . Your chart lists present dose of thyroid  As 50 mcg daily.DECREASE to 50 mcg EXCEPT 25 mcg (1/2 pill) on Weds.PLEASE BRING THESE INSTRUCTIONS TO FOLLOW UP  LAB APPOINTMENT in 10 weeks for repeat TSH.This will guarantee correct labs are drawn, eliminating need for repeat blood sampling ( needle sticks ! ). Diagnoses /Codes:244.9.      Low Ferritin can be associated with muscle symptoms or even restless legs. Your value is normal.      Thank you for the [facility] tickets ; it is always a treat ! [employer]

## 2011-03-05 NOTE — Progress Notes (Signed)
OFFICE PROGRESS NOTE   INTERVAL HISTORY:   He returns as scheduled. He underwent aortic valve replacement procedure at [facility] on 01/23/2011. He reports developing a gout flare in the right great toe while hospitalized. This has improved, but has not completely resolved. The platelet count was adequate for surgery after he received preoperative Nplate. He complains of insomnia.  Objective:  Vital signs in last 24 hours:  Blood pressure 144/62, pulse 65, temperature 97 F (36.1 C), temperature source Oral, height 5\' 10"  (1.778 m), weight 175 lb 1.6 oz (79.425 kg).   Lymphatics: No cervical, supraclavicular, axillary, or inguinal nodes Resp: Lungs clear bilaterally Cardio: Regular rate and rhythm, 2/6 systolic murmur GI: No hepatosplenomegaly Vascular: No leg edema  Musculoskeletal: Mild erythema at the first MTP joint on the right. No apparent effusion.  Lab Results:  Lab Results  Component Value Date   WBC 4.2 03/05/2011   HGB 12.5* 03/05/2011   HCT 36.5* 03/05/2011   MCV 84.9 03/05/2011   PLT 84* 03/05/2011   ANC 3.3    Medications: I have reviewed the patient's current medications.  Assessment/Plan: 1. Chronic pancytopenia-the thrombocytopenia responded to Nplate therapy 2. Splenomegaly.  3. History of atrial fibrillation.  4. Admission with a left lower extremity cellulitis in June 2009.  5. "Hot flashes," etiology unclear.  6. Diabetes.  7. Severe aortic stenosis-he is being evaluated for aortic valve replacement. 8. 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 9. Episode of gout at the right great toe following the aortic valve replacement surgery  Disposition:  The  platelet count remains adequate. He appears stable from a hematologic standpoint. He will return for a CBC in 3 months. He is scheduled for an opposite visit in 6 months. He will try over-the-counter Benadryl for the insomnia. If this does not help he will see Dr. Alwyn Ren.   Lucile Shutters, MD  03/05/2011  3:17 PM

## 2011-03-06 ENCOUNTER — Telehealth: Payer: Self-pay

## 2011-03-06 NOTE — Telephone Encounter (Signed)
Increase lorazepam to 0.5 mg 2 pills at bedtime as needed. Continue the ranitidine twice a day before breakfast and evening meal. Monitor for any stool changes of rectal bleeding or melena. If nausea persists or progresses; GI consul tindicated

## 2011-03-06 NOTE — Telephone Encounter (Signed)
Patient called stating that he saw the doctor last week or the week before and has been taking the sleeping pills and the medication that the doctor gave him, however, he is still nauseated, not eating or sleeping well.  Please advise.

## 2011-03-07 ENCOUNTER — Ambulatory Visit: Payer: Medicare Other | Admitting: Cardiology

## 2011-03-07 NOTE — Telephone Encounter (Signed)
Spoke with patient regarding recommendations and changes.  He said he would try.  He was instructed to call us back in a week or so if still not feeling better.

## 2011-03-07 NOTE — Telephone Encounter (Signed)
LMVM for patient to return call. 

## 2011-03-07 NOTE — Telephone Encounter (Signed)
Order signed and faxed Mylo Red RN

## 2011-03-07 NOTE — Telephone Encounter (Signed)
Patient returned your call.

## 2011-03-08 ENCOUNTER — Encounter (HOSPITAL_COMMUNITY): Payer: Self-pay

## 2011-03-08 ENCOUNTER — Encounter (HOSPITAL_COMMUNITY)
Admission: RE | Admit: 2011-03-08 | Discharge: 2011-03-08 | Disposition: A | Discharge status: Home / Self Care | Payer: Medicare Other | Source: Ambulatory Visit | Attending: Cardiology | Admitting: Cardiology

## 2011-03-08 NOTE — Progress Notes (Addendum)
Cardiac Rehab Medication Review by a Pharmacist  Does the patient  feel that his/her medications are working for him/her?  yes  Has the patient been experiencing any side effects to the medications prescribed?  no  Does the patient measure his/her own blood pressure or blood glucose at home?  yes   Does the patient have any problems obtaining medications due to transportation or finances?   yes  Understanding of regimen: fair Understanding of indications: poor Potential of compliance: poor    Pharmacist comments:  1. It is unclear if this patient should be on amiodarone. A recent note to the patient says to stop taking amiodarone. Patient says that he was told to restart amiodarone but to stop "pacerdone." There is no medication by the name of pacerdone. Patient has an appointment with Dr. Daleen Squibb next week. Patient says it is impossible to get a hold of anyone at his clinic office; he's tried on multiple occasions to talk with someone by phone. 2. Patient cannot recall name or indication of most cardiac meds.  3. Patient was told to stop taking a medication to help with sleep. He has been taking a round white pill 1-2 tablets qhs PRN sleep. I suspect this to be the Ambien, not Lorazepam. Please address which medication he should be on for sleep.  Zoe Lan, PharmD pgr [phone] 03/08/2011, 8:34 AM

## 2011-03-09 ENCOUNTER — Telehealth: Payer: Self-pay | Admitting: Internal Medicine

## 2011-03-09 ENCOUNTER — Telehealth: Payer: Self-pay | Admitting: Cardiology

## 2011-03-09 NOTE — Telephone Encounter (Signed)
Patient just wanted to clarify the medication change indicated in the letter.  His questions were answered.

## 2011-03-09 NOTE — Telephone Encounter (Signed)
Patient called regarding letter copied from chart below, please call back @ [phone]   March 05, 2011   [name] [address] [place] [address]   Dear [name],  Below are the results from your recent visit:  TSH      Component Value Range   TSH 0.62  0.35 - 5.50 (uIU/mL)  FERRITIN      Component Value Range   Ferritin 427.2 (*) 22.0 - 322.0 (ng/mL)   TSH (Thyroid Stimulating Hormone) normal range = 0.35- 5.50. Ideal value is 1-3. A Value below 0.35 indicates excessive thyroid function or excessive thyroid medication supplementation (HYPERthyroid state) . Your chart lists present dose of thyroid As 50 mcg daily.DECREASE to 50 mcg EXCEPT 25 mcg (1/2 pill) on Weds.PLEASE BRING THESE INSTRUCTIONS TO FOLLOW UP LAB APPOINTMENT in 10 weeks for repeat TSH.This will guarantee correct labs are drawn, eliminating need for repeat blood sampling ( needle sticks ! ). Diagnoses /Codes:244.9. Low Ferritin can be associated with muscle symptoms or even restless legs. Your value is normal. Thank you for the [facility] tickets ; it is always a treat ! [employer]    If you have any questions or concerns, please don't hesitate to call.  Sincerely,    Titus Dubin. Alwyn Ren

## 2011-03-09 NOTE — Telephone Encounter (Signed)
Wife has already been called by Lorin Picket results of ECHO Mylo Red RN

## 2011-03-09 NOTE — Telephone Encounter (Signed)
Patient wife [name] request return call [phone]  Patient had Echo 2/21 never received any test results.  Please return call to patient wife [name] on hm#

## 2011-03-12 ENCOUNTER — Encounter (HOSPITAL_COMMUNITY): Payer: Self-pay

## 2011-03-12 ENCOUNTER — Encounter (HOSPITAL_COMMUNITY)
Admission: RE | Admit: 2011-03-12 | Discharge: 2011-03-12 | Disposition: A | Discharge status: Home / Self Care | Payer: Medicare Other | Source: Ambulatory Visit | Attending: Cardiology | Admitting: Cardiology

## 2011-03-12 DIAGNOSIS — Z8679 Personal history of other diseases of the circulatory system: Secondary | ICD-10-CM | POA: Insufficient documentation

## 2011-03-12 DIAGNOSIS — N189 Chronic kidney disease, unspecified: Secondary | ICD-10-CM | POA: Insufficient documentation

## 2011-03-12 DIAGNOSIS — K219 Gastro-esophageal reflux disease without esophagitis: Secondary | ICD-10-CM | POA: Insufficient documentation

## 2011-03-12 DIAGNOSIS — I509 Heart failure, unspecified: Secondary | ICD-10-CM | POA: Insufficient documentation

## 2011-03-12 DIAGNOSIS — I129 Hypertensive chronic kidney disease with stage 1 through stage 4 chronic kidney disease, or unspecified chronic kidney disease: Secondary | ICD-10-CM | POA: Insufficient documentation

## 2011-03-12 DIAGNOSIS — I080 Rheumatic disorders of both mitral and aortic valves: Secondary | ICD-10-CM | POA: Insufficient documentation

## 2011-03-12 DIAGNOSIS — I5030 Unspecified diastolic (congestive) heart failure: Secondary | ICD-10-CM | POA: Insufficient documentation

## 2011-03-12 DIAGNOSIS — I10 Essential (primary) hypertension: Secondary | ICD-10-CM | POA: Insufficient documentation

## 2011-03-12 DIAGNOSIS — I2510 Atherosclerotic heart disease of native coronary artery without angina pectoris: Secondary | ICD-10-CM | POA: Insufficient documentation

## 2011-03-12 DIAGNOSIS — I4891 Unspecified atrial fibrillation: Secondary | ICD-10-CM | POA: Insufficient documentation

## 2011-03-12 DIAGNOSIS — Z5189 Encounter for other specified aftercare: Secondary | ICD-10-CM | POA: Insufficient documentation

## 2011-03-12 DIAGNOSIS — E039 Hypothyroidism, unspecified: Secondary | ICD-10-CM | POA: Insufficient documentation

## 2011-03-12 NOTE — Progress Notes (Signed)
Pt started cardiac rehab today.  Pt tolerated light exercise without difficulty.  VSS, telemetry-Sinus rhythm.  Pt denies cp or dyspnea with exertion.  Pt oriented to exercise equipment and routine.  Understanding verbalized.  Will continue to monitor the patient throughout  the program.-jr,rn

## 2011-03-14 ENCOUNTER — Other Ambulatory Visit: Payer: Self-pay | Admitting: Cardiology

## 2011-03-14 ENCOUNTER — Encounter (HOSPITAL_COMMUNITY)
Admission: RE | Admit: 2011-03-14 | Discharge: 2011-03-14 | Disposition: A | Discharge status: Home / Self Care | Payer: Medicare Other | Source: Ambulatory Visit | Attending: Cardiology | Admitting: Cardiology

## 2011-03-14 NOTE — Telephone Encounter (Signed)
New Msg: pt wife calling want RX's refill request sent to [facility]. Please call in RX with 90 day supply.

## 2011-03-14 NOTE — Progress Notes (Signed)
[name] 78 y.o. male       Nutrition Screen                                                                    YES  NO Do you live in a nursing home?  X   Do you eat out more than 3 times/week?    X If yes, how many times per week do you eat out?  Do you have food allergies?   X If yes, what are you allergic to?  Have you gained or lost more than 10 lbs without trying?              X  If yes, how much weight have you lost and over what time period? 20 lbs lost over  6 weeks  Do you want to lose weight?     X If yes, what is a goal weight or amount of weight you would like to lose?  lb  Do you eat alone most of the time?   X   Do you eat less than 2 meals/day?  X If yes, how many meals do you eat? 3  Do you drink more than 3 alcohol drinks/day?  X If yes, how many drinks per day? 0  Are you having trouble with constipation? *  X If yes, what are you doing to help relieve constipation?  Do you have financial difficulties with buying food?*    X   Are you experiencing regular nausea/ vomiting?*     X   Do you have a poor appetite? *                                       X    Do you have trouble chewing/swallowing? *   X    Pt with diagnoses of:  X GERD          X AVR/MVR/ICD      X >33 years old  X %  Body fat >goal / Body Mass Index >25 X HTN / BP >120/80 XCHF  X DM/A1c >6 / CBG >126       Pt Risk Score   6       Diagnosis Risk Score  130       Total Risk Score   136                        X High Risk                Low Risk    HT: 70" Ht Readings from Last 1 Encounters:  03/08/11 5\' 10"  (1.778 m)    WT:   173.1 lb (78.7 kg) Wt Readings from Last 3 Encounters:  03/08/11 173 lb 8 oz (78.7 kg)  03/05/11 175 lb 1.6 oz (79.425 kg)  02/27/11 177 lb (80.287 kg)     IBW 75.5 104%IBW BMI 24.9 27.0%body fat  Meds reviewed: Vitamin D, Fish Oil, Flaxseed oil, Lasix, Potassium Chloride, MVI, Metamucil, Vitamin C Past Medical History  Diagnosis Date  . Stroke   . Atrial  fibrillation  amiodarone therapy  . Bradycardia   . Aortic stenosis     echo 10/12: EF 65-70%, grade 2 diast dysfxn, severe AS, mean gradient 60 mmHg, mod LAE, PASP 47;  s/p minimally invasive tissue AVR with Dr. Silvestre Mesi at [facility] 01/2011 (pre-AVR cath with no obs CAD)  . Hypertension   . Diabetes mellitus   . Anemia   . Action tremor   . Thrombocytopenia     Dr. [name]  . Throat cancer     s/p resection  . Colon polyp   . Anxiety   . Splenomegaly   . Gout   . GERD (gastroesophageal reflux disease)   . Hypothyroidism   . Leukopenia     Chronic pancytopenia  . Degenerative disc disease   . Joint effusion, knee     left knee  . Synovial cyst of popliteal space   . Cellulitis of left leg 10/11-16/2012  . CKD (chronic kidney disease)         Activity level: Pt is moderately active  Wt goal: 173 lb ( 78.7 kg) Current tobacco use? No      Food/Drug Interaction? No       Labs:  Lipid Panel  No results found for this basename: chol, trig, hdl, cholhdl, vldl, ldlcalc   Lab Results  Component Value Date   HGBA1C 6.0 10/18/2010   10/24/10 Glucose 108  LDL goal: < 70      DM and > 2:      HTN, > 76 yo male,   Estimated Daily Nutrition Needs for: ? wt maintenance  2100-2200 Kcal , Total Fat 65-70gm, Saturated Fat 15-17 gm, Trans Fat 2.2-2.5 gm,  Sodium less than 1500 mg

## 2011-03-14 NOTE — Telephone Encounter (Signed)
FU Call: Pt is almost out of lorazepam 0.5 m   Please call pt RX in ASAP.   [facility] Fax # [phone]

## 2011-03-15 MED ORDER — DILTIAZEM HCL ER COATED BEADS 240 MG PO CP24: 240.0000 mg | ORAL_CAPSULE | Freq: Every day | ORAL | Status: DC

## 2011-03-15 MED ORDER — AMIODARONE HCL 200 MG PO TABS: 200.0000 mg | ORAL_TABLET | Freq: Every day | ORAL | Status: DC

## 2011-03-15 MED ORDER — POTASSIUM CHLORIDE CRYS ER 20 MEQ PO TBCR: 30.0000 meq | EXTENDED_RELEASE_TABLET | Freq: Every day | ORAL | Status: DC

## 2011-03-15 MED ORDER — FUROSEMIDE 20 MG PO TABS: 40.0000 mg | ORAL_TABLET | Freq: Every day | ORAL | Status: DC

## 2011-03-15 MED ORDER — METOPROLOL SUCCINATE ER 25 MG PO TB24: 25.0000 mg | ORAL_TABLET | Freq: Every day | ORAL | Status: DC

## 2011-03-16 ENCOUNTER — Encounter (HOSPITAL_COMMUNITY)
Admission: RE | Admit: 2011-03-16 | Discharge: 2011-03-16 | Disposition: A | Discharge status: Home / Self Care | Payer: Medicare Other | Source: Ambulatory Visit | Attending: Cardiology | Admitting: Cardiology

## 2011-03-19 ENCOUNTER — Encounter (HOSPITAL_COMMUNITY)
Admission: RE | Admit: 2011-03-19 | Discharge: 2011-03-19 | Disposition: A | Discharge status: Home / Self Care | Payer: Medicare Other | Source: Ambulatory Visit | Attending: Cardiology | Admitting: Cardiology

## 2011-03-21 ENCOUNTER — Encounter (HOSPITAL_COMMUNITY)
Admission: RE | Admit: 2011-03-21 | Discharge: 2011-03-21 | Disposition: A | Discharge status: Home / Self Care | Payer: Medicare Other | Source: Ambulatory Visit | Attending: Cardiology | Admitting: Cardiology

## 2011-03-22 ENCOUNTER — Telehealth: Payer: Self-pay | Admitting: Cardiology

## 2011-03-22 ENCOUNTER — Other Ambulatory Visit: Payer: Self-pay | Admitting: Internal Medicine

## 2011-03-22 NOTE — Telephone Encounter (Signed)
New Msg: Pt wife calling needing RX of [facility]oprolol [facility] [address] [facility] [place]. RX was called into mail order however medication will not be delivered into pt until after he is out; therefore pt needs RX called in for 10 tablets to hold him over until RX comes in.   Please return pt call to discuss further if necessary.

## 2011-03-22 NOTE — Telephone Encounter (Signed)
Prescription called to pharmacy and pt notified.

## 2011-03-23 ENCOUNTER — Encounter (HOSPITAL_COMMUNITY): Payer: Medicare Other

## 2011-03-26 ENCOUNTER — Encounter (HOSPITAL_COMMUNITY)
Admission: RE | Admit: 2011-03-26 | Discharge: 2011-03-26 | Disposition: A | Discharge status: Home / Self Care | Payer: Medicare Other | Source: Ambulatory Visit | Attending: Cardiology | Admitting: Cardiology

## 2011-03-26 NOTE — Progress Notes (Signed)
Pt c/o back pain while walking on track. Pt reports pain is to the right and center of mid back, describes as a dull ache and this is a chronic condition for him. Pt reports relief of the sx while riding stationary bike.  Will continue to monitor-jrion,rn

## 2011-03-28 ENCOUNTER — Encounter (HOSPITAL_COMMUNITY)
Admission: RE | Admit: 2011-03-28 | Discharge: 2011-03-28 | Disposition: A | Discharge status: Home / Self Care | Payer: Medicare Other | Source: Ambulatory Visit | Attending: Cardiology | Admitting: Cardiology

## 2011-03-28 NOTE — Progress Notes (Signed)
[name] 78 y.o. male Nutrition Note Spoke with pt.  Nutrition Plan and Nutrition Survey reviewed with pt. Pt is following Step 1 of the Therapeutic Lifestyle Changes diet. This Clinical research associate went over Diabetes Education test results. Pt is not on any DM medications and states he has not been previously educated re: DM. DM education offered via Cardiac Rehab reviewed with pt. Pt checks his CBG's q am before breakfast.  Fasting CBG's @138  mg/dL per pt. Pt unaware of recommended CBG range for fasting CBG's. Recommended fasting CBG range reviewed with pt. Pt reports he lost 20 lbs during the 6 weeks following his AVR surgery. Pt states his UBW was 180-185 lbs and that "I would like to stay at the weight I am now," which is 170-175 lbs. Nutrition Diagnosis   Food-and nutrition-related knowledge deficit related to lack of exposure to information as related to diagnosis of: ? CVD ? DM (A1c 6.0)   Nutrition RX/ Estimated Daily Nutrition Needs for: wt maintenance 2100-2200 Kcal, 65-70 gm fat, 15-17 gm sat fat, 2.2-2.5 gm trans-fat, <1500 mg sodium, 250 gm CHO   Nutrition Intervention   Pt's individual nutrition plan including cholesterol goals reviewed with pt.   Benefits of adopting Therapeutic Lifestyle Changes discussed when Medficts reviewed.   Pt to attend the Portion Distortion class   Pt to attend the  ? Nutrition I class                     ? Nutrition II class        ? Diabetes Blitz class       ? Diabetes Q & A class   Pt given handouts for: ? DM ? low sodium    Continue client-centered nutrition education by RD, as part of interdisciplinary care. Goal(s)   Pt to identify and limit food sources of saturated fat, trans fat, and cholesterol   Pt able to name foods that affect blood glucose  Monitor and Evaluate progress toward nutrition goal with team.

## 2011-03-30 ENCOUNTER — Encounter (HOSPITAL_COMMUNITY)
Admission: RE | Admit: 2011-03-30 | Discharge: 2011-03-30 | Disposition: A | Discharge status: Home / Self Care | Payer: Medicare Other | Source: Ambulatory Visit | Attending: Cardiology | Admitting: Cardiology

## 2011-03-30 NOTE — Progress Notes (Signed)
Reviewed home exercise with pt today.  Pt plans to walk outdoors for exercise.  Reviewed THR, pulse, RPE, sign and symptoms, NTG use, and when to call 911 or MD.  Pt voiced understanding. Electronically signed by Harriett Sine MS on Friday March 30 2011 at [phone]

## 2011-04-02 ENCOUNTER — Encounter (HOSPITAL_COMMUNITY)
Admission: RE | Admit: 2011-04-02 | Discharge: 2011-04-02 | Disposition: A | Discharge status: Home / Self Care | Payer: Medicare Other | Source: Ambulatory Visit | Attending: Cardiology | Admitting: Cardiology

## 2011-04-03 ENCOUNTER — Ambulatory Visit (INDEPENDENT_AMBULATORY_CARE_PROVIDER_SITE_OTHER): Payer: Medicare Other | Admitting: Cardiology

## 2011-04-03 ENCOUNTER — Ambulatory Visit: Payer: Medicare Other | Admitting: Cardiology

## 2011-04-03 ENCOUNTER — Encounter: Payer: Self-pay | Admitting: Cardiology

## 2011-04-03 VITALS — BP 116/59 | HR 60 | Resp 18 | Ht 70.0 in | Wt 176.0 lb

## 2011-04-03 DIAGNOSIS — I4891 Unspecified atrial fibrillation: Secondary | ICD-10-CM

## 2011-04-03 DIAGNOSIS — I359 Nonrheumatic aortic valve disorder, unspecified: Secondary | ICD-10-CM

## 2011-04-03 DIAGNOSIS — I2510 Atherosclerotic heart disease of native coronary artery without angina pectoris: Secondary | ICD-10-CM

## 2011-04-03 MED ORDER — COLCHICINE 0.6 MG PO TABS: ORAL_TABLET | ORAL | Status: DC

## 2011-04-03 MED ORDER — COLCHICINE 0.6 MG PO TABS: 0.6000 mg | ORAL_TABLET | Freq: Every day | ORAL | Status: DC

## 2011-04-03 NOTE — Assessment & Plan Note (Signed)
Regular rhythm by exam today. Continue amiodarone.

## 2011-04-03 NOTE — Patient Instructions (Signed)
Your physician wants you to follow-up in: 6 months with Dr. Wall.  You will receive a reminder letter in the mail two months in advance. If you don't receive a letter, please call our office to schedule the follow-up appointment.  

## 2011-04-03 NOTE — Progress Notes (Signed)
HPI [name] comes in today after having his aortic valve replaced at [facility] on Jan 15. He has done remarkably well with some atypical chest pain. He had no significant obstructive coronary disease.  He remains on amiodarone  For his A. Fib. He's had no clinical recurrence. He is not on Coumadin because of a severe anemia, thrombocytopenia, and frequent falls.  Past Medical History  Diagnosis Date  . Stroke   . Atrial fibrillation     amiodarone therapy  . Bradycardia   . Aortic stenosis     echo 10/12: EF 65-70%, grade 2 diast dysfxn, severe AS, mean gradient 60 mmHg, mod LAE, PASP 47;  s/p minimally invasive tissue AVR with Dr. Silvestre Mesi at [facility] 01/2011 (pre-AVR cath with no obs CAD)  . Hypertension   . Diabetes mellitus   . Anemia   . Action tremor   . Thrombocytopenia     Dr. [name]  . Throat cancer     s/p resection  . Colon polyp   . Anxiety   . Splenomegaly   . Gout   . GERD (gastroesophageal reflux disease)   . Hypothyroidism   . Leukopenia     Chronic pancytopenia  . Degenerative disc disease   . Joint effusion, knee     left knee  . Synovial cyst of popliteal space   . Cellulitis of left leg 10/11-16/2012  . CKD (chronic kidney disease)     Current Outpatient Prescriptions  Medication Sig Dispense Refill  . allopurinol (ZYLOPRIM) 100 MG tablet Take 100 mg by mouth Daily.      [name] Kitchen amiodarone (PACERONE) 200 MG tablet Take 1 tablet (200 mg total) by mouth daily.  90 tablet  3  . aspirin 325 MG tablet Take 325 mg by mouth daily.        . Cholecalciferol (VITAMIN D) 1000 UNITS capsule Take 1 capsule (1,000 Units total) by mouth daily.  90 capsule  3  . diltiazem (CARDIZEM CD) 240 MG 24 hr capsule Take 1 capsule (240 mg total) by mouth daily.  90 capsule  3  . fish oil-omega-3 fatty acids 1000 MG capsule Take 1 g by mouth daily.        . Flaxseed, Linseed, (FLAX SEED OIL) 1000 MG CAPS Take 1 capsule by mouth daily.       . furosemide (LASIX) 20 MG tablet Take 2 tablets  (40 mg total) by mouth daily.  180 tablet  3  . levothyroxine (SYNTHROID, LEVOTHROID) 50 MCG tablet Take 50 mcg by mouth. 1 by mouth daily except 25 mcg on Wed      . LORazepam (ATIVAN) 0.5 MG tablet Take 0.5 mg by mouth at bedtime as needed. Pt takes 1-2 tablets daily prn sleep      . metoprolol succinate (TOPROL-XL) 25 MG 24 hr tablet Take 1 tablet (25 mg total) by mouth daily.  90 tablet  3  . Multiple Vitamin (MULTIVITAMIN) tablet Take 1 tablet by mouth daily.        . nitroGLYCERIN (NITROSTAT) 0.4 MG SL tablet Place 1 tablet (0.4 mg total) under the tongue every 5 (five) minutes as needed for chest pain.  25 tablet  6  . potassium chloride SA (K-DUR,KLOR-CON) 20 MEQ tablet Take 1.5 tablets (30 mEq total) by mouth daily. 1 1/2 by mouth daily  135 tablet  3  . psyllium (METAMUCIL) 58.6 % packet Take 1 packet by mouth daily.        . ranitidine (ZANTAC) 150  MG tablet Take 1 tablet (150 mg total) by mouth 2 (two) times daily.  60 tablet  2  . vitamin C (ASCORBIC ACID) 500 MG tablet Take 1 tablet (500 mg total) by mouth daily.  90 tablet  3  . colchicine 0.6 MG tablet Take one tablet by mouth daily during an acute attack of gout.  30 tablet  2  . colchicine 0.6 MG tablet Take one tablet by mouth daily during an acute episode of gout.  90 tablet  3  . zolpidem (AMBIEN) 5 MG tablet Take 5-10 mg by mouth at bedtime as needed. As needed for sleep        Allergies  Allergen Reactions  . Penicillins     Does not remember reaction (~year 1950)  . Triple Antibiotic     ? Reaction (thinks he remembers redness)    Family History  Problem Relation Age of Onset  . Colon cancer    . Stroke    . Cancer Other     colon  . Stroke Other     History   Social History  . Marital Status: Married    Spouse Name: N/A    Number of Children: 3  . Years of Education: N/A   Occupational History  . Retired Company secretary    Social History Main Topics  . Smoking status: Former Smoker    Quit date:  01/11/1962  . Smokeless tobacco: Not on file  . Alcohol Use: No  . Drug Use: Not on file  . Sexually Active: Not on file   Other Topics Concern  . 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    ROS ALL NEGATIVE EXCEPT THOSE NOTED IN HPI  PE  General Appearance: well developed, well nourished in no acute distress, Frail HEENT: symmetrical face, PERRLA, good dentition  Neck: no JVD, thyromegaly, or adenopathy, trachea midline Chest: symmetric without deformity Cardiac: PMI non-displaced, RRR, normal S1, S2, no gallop or murmur Lung: clear to ausculation and percussion Vascular: all pulses full without bruits  Abdominal: nondistended, nontender, good bowel sounds, no HSM, no bruits Extremities: no cyanosis, clubbing or edema, no sign of DVT, no varicosities  Skin: normal color, no rashes Neuro: alert and oriented x 3, non-focal Pysch: normal affect  EKG  BMET    Component Value Date/Time   NA 140 03/05/2011 0946   K 3.9 03/05/2011 0946   CL 104 03/05/2011 0946   CO2 25 03/05/2011 0946   GLUCOSE 122* 03/05/2011 0946   BUN 32* 03/05/2011 0946   CREATININE 1.41* 03/05/2011 0946   CALCIUM 9.1 03/05/2011 0946   GFRNONAA 48* 01/12/2011 1703   GFRAA 55* 01/12/2011 1703    Lipid Panel  No results found for this basename: chol, trig, hdl, cholhdl, vldl, ldlcalc    CBC    Component Value Date/Time   WBC 4.2 03/05/2011 0946   WBC 4.2* 02/23/2011 1056   RBC 4.30 03/05/2011 0946   RBC 4.22 02/23/2011 1056   HGB 12.5* 03/05/2011 0946   HGB 12.0* 02/23/2011 1056   HCT 36.5* 03/05/2011 0946   HCT 36.3* 02/23/2011 1056   PLT 84* 03/05/2011 0946   PLT 94.0* 02/23/2011 1056   MCV 84.9 03/05/2011 0946   MCV 86.1 02/23/2011 1056   MCH 29.1 03/05/2011 0946   MCH 29.5 10/24/2010 0500   MCHC 34.3 03/05/2011 0946   MCHC 33.1 02/23/2011 1056   RDW 17.3* 03/05/2011 0946   RDW  16.2* 02/23/2011 1056   LYMPHSABS 0.6* 03/05/2011 0946   LYMPHSABS 0.6* 02/23/2011 1056   MONOABS  0.2 03/05/2011 0946   MONOABS 0.3 02/23/2011 1056   EOSABS 0.0 03/05/2011 0946   EOSABS 0.0 02/23/2011 1056   BASOSABS 0.0 03/05/2011 0946   BASOSABS 0.0 02/23/2011 1056

## 2011-04-03 NOTE — Assessment & Plan Note (Signed)
Status post aortic valve replacement doing well. Return to see me in 6 months.

## 2011-04-03 NOTE — Assessment & Plan Note (Signed)
Nonobstructive disease. Chest pain atypical most likely musculoskeletal. Reassurance given.

## 2011-04-04 ENCOUNTER — Encounter (HOSPITAL_COMMUNITY)
Admission: RE | Admit: 2011-04-04 | Discharge: 2011-04-04 | Disposition: A | Discharge status: Home / Self Care | Payer: Medicare Other | Source: Ambulatory Visit | Attending: Cardiology | Admitting: Cardiology

## 2011-04-06 ENCOUNTER — Encounter (HOSPITAL_COMMUNITY): Payer: Medicare Other

## 2011-04-09 ENCOUNTER — Encounter (HOSPITAL_COMMUNITY)
Admission: RE | Admit: 2011-04-09 | Discharge: 2011-04-09 | Disposition: A | Discharge status: Home / Self Care | Payer: Medicare Other | Source: Ambulatory Visit | Attending: Cardiology | Admitting: Cardiology

## 2011-04-09 DIAGNOSIS — E039 Hypothyroidism, unspecified: Secondary | ICD-10-CM | POA: Insufficient documentation

## 2011-04-09 DIAGNOSIS — I509 Heart failure, unspecified: Secondary | ICD-10-CM | POA: Insufficient documentation

## 2011-04-09 DIAGNOSIS — I4891 Unspecified atrial fibrillation: Secondary | ICD-10-CM | POA: Insufficient documentation

## 2011-04-09 DIAGNOSIS — Z5189 Encounter for other specified aftercare: Secondary | ICD-10-CM | POA: Insufficient documentation

## 2011-04-09 DIAGNOSIS — K219 Gastro-esophageal reflux disease without esophagitis: Secondary | ICD-10-CM | POA: Insufficient documentation

## 2011-04-09 DIAGNOSIS — I10 Essential (primary) hypertension: Secondary | ICD-10-CM | POA: Insufficient documentation

## 2011-04-09 DIAGNOSIS — I5030 Unspecified diastolic (congestive) heart failure: Secondary | ICD-10-CM | POA: Insufficient documentation

## 2011-04-09 DIAGNOSIS — I080 Rheumatic disorders of both mitral and aortic valves: Secondary | ICD-10-CM | POA: Insufficient documentation

## 2011-04-09 DIAGNOSIS — I2510 Atherosclerotic heart disease of native coronary artery without angina pectoris: Secondary | ICD-10-CM | POA: Insufficient documentation

## 2011-04-09 DIAGNOSIS — I129 Hypertensive chronic kidney disease with stage 1 through stage 4 chronic kidney disease, or unspecified chronic kidney disease: Secondary | ICD-10-CM | POA: Insufficient documentation

## 2011-04-09 DIAGNOSIS — Z8679 Personal history of other diseases of the circulatory system: Secondary | ICD-10-CM | POA: Insufficient documentation

## 2011-04-09 DIAGNOSIS — N189 Chronic kidney disease, unspecified: Secondary | ICD-10-CM | POA: Insufficient documentation

## 2011-04-10 ENCOUNTER — Other Ambulatory Visit: Payer: Self-pay | Admitting: Internal Medicine

## 2011-04-10 NOTE — Telephone Encounter (Signed)
Dr.Hopper please advise, I do not see where we previously filled this for this patient (I checked [facility] also)

## 2011-04-10 NOTE — Telephone Encounter (Signed)
Refill For  Lorazepam 0.5mg  Tablet  Take one tablet nightly at bedtime as needed  Doesn't show written here  Last written by Deveron Furlong RN on 3.6.13

## 2011-04-10 NOTE — Telephone Encounter (Signed)
#   30 , R x 2

## 2011-04-10 NOTE — Telephone Encounter (Signed)
Pt also left msg on triage vmail in reference to having this med refilled.

## 2011-04-10 NOTE — Telephone Encounter (Signed)
Pt wife called back to see if the rx had been sent for the Lorazepam, please advise

## 2011-04-11 ENCOUNTER — Encounter (HOSPITAL_COMMUNITY)
Admission: RE | Admit: 2011-04-11 | Discharge: 2011-04-11 | Disposition: A | Discharge status: Home / Self Care | Payer: Medicare Other | Source: Ambulatory Visit | Attending: Cardiology | Admitting: Cardiology

## 2011-04-11 MED ORDER — LORAZEPAM 0.5 MG PO TABS: 0.5000 mg | ORAL_TABLET | Freq: Every evening | ORAL | Status: DC | PRN

## 2011-04-11 NOTE — Telephone Encounter (Signed)
RX sent

## 2011-04-13 ENCOUNTER — Encounter (HOSPITAL_COMMUNITY)
Admission: RE | Admit: 2011-04-13 | Discharge: 2011-04-13 | Disposition: A | Discharge status: Home / Self Care | Payer: Medicare Other | Source: Ambulatory Visit | Attending: Cardiology | Admitting: Cardiology

## 2011-04-13 ENCOUNTER — Telehealth: Payer: Self-pay | Admitting: Cardiology

## 2011-04-13 NOTE — Telephone Encounter (Signed)
Discussed pt issues with lori gerhardt np, pt to stop diltiazem. He will see lori next week to make sure hheart rate and bp okay. Pt voiced understanding. He will call with further problems prior to appt.

## 2011-04-13 NOTE — Progress Notes (Signed)
Pt arrived at cardiac rehab with concerns about bradycardia at home.  HR-43 with his home BP monitor upon arrival home from cardiac rehab.  Pt states he felt "strange".   Pt also reports episode of presyncope on Tuesday after walking uphill.  Symptoms relieved with rest.  Pt returned walk home (downhill) and symptoms returned. Again, symptoms relieved with rest.  Pt denies that he experienced sx on Wednesday at Cardiac Rehab, however HR-48 during recovery on Wednesday.  Pt   C/o chest discomfort at rest at cardiac rehab.  Pt rates 3/10, sx quickly relieved with lying down.  BP- 164/62, HR-58 upon arrival.   Pt reports he did not sleep well last night, has been out of lorazepam for 2 days.  Phone call to Dr. Vern Claude office, pt spoke to [place]. Per pt,she will discuss pt sx with MD and call him at home.  BP-148/64 HR- 60.  Pt asymptomatic, discharged to home.

## 2011-04-13 NOTE — Telephone Encounter (Signed)
New problem:  Per Randa Evens from cardiac rehab. patient their now . would like to be seen today. C/O chest discomfort, pre-syncope on Tuesday B/p 164/62.

## 2011-04-13 NOTE — Telephone Encounter (Signed)
Spoke with pt, he is currently at cardiac rehab. He had an episode tuesday this week, he had been walking about 3/4 aq mile on uneven ground. He stopped at his nephew's store and suddenly felt faint. He denies dizziness or palpitations. After resting it went away and he was able to walk home, he had another episode when he got home. He has noticed that he will occ feel "funny", will check his pulse and it be down in the 40's. He had some chest discomfort this am that was relieved with rest and is completely gone now. He is drinking 2 glasses of water with breakfast every morning. He feels fine now and will go home, i will discuss with the DOD and call him there.

## 2011-04-16 ENCOUNTER — Telehealth: Payer: Self-pay | Admitting: Cardiology

## 2011-04-16 ENCOUNTER — Encounter (HOSPITAL_COMMUNITY)
Admission: RE | Admit: 2011-04-16 | Discharge: 2011-04-16 | Disposition: A | Discharge status: Home / Self Care | Payer: Medicare Other | Source: Ambulatory Visit | Attending: Cardiology | Admitting: Cardiology

## 2011-04-16 MED ORDER — LEVOTHYROXINE SODIUM 50 MCG PO TABS: ORAL_TABLET | ORAL | Status: DC

## 2011-04-16 NOTE — Telephone Encounter (Signed)
New Problem:     [name] called in needing a 90 day refill of her husbands levothyroxine (SYNTHROID, LEVOTHROID) 50 MCG tablet with Primemail (phone#[phone]). Patient would like all of his medications to be refilled with Primemail from now on.  Please call if you have any questions.

## 2011-04-16 NOTE — Progress Notes (Signed)
Pt reports he feels well today with no further presyncopal episodes.  Pt stopped Diltiazem per [employer].  Pt reports he continues to be out of lorazepam and is also out of levothyroxine.  Pt states it is too early to have lorazepam filled as he was doubling his dose.  Pt states his wife is calling his MD today to request refill of levothyroxine.  Pt prefers mail order pharmacy for this med but is willing to purchase from local pharmacy will awaiting mail order med to arrive.  Pt instructed to let me know if he continues to have difficulty obtaining medication.   Understanding verbalized

## 2011-04-17 ENCOUNTER — Other Ambulatory Visit: Payer: Self-pay | Admitting: Cardiology

## 2011-04-17 NOTE — Telephone Encounter (Signed)
error 

## 2011-04-18 ENCOUNTER — Encounter (HOSPITAL_COMMUNITY)
Admission: RE | Admit: 2011-04-18 | Discharge: 2011-04-18 | Disposition: A | Discharge status: Home / Self Care | Payer: Medicare Other | Source: Ambulatory Visit | Attending: Cardiology | Admitting: Cardiology

## 2011-04-18 ENCOUNTER — Encounter: Payer: Self-pay | Admitting: Nurse Practitioner

## 2011-04-18 ENCOUNTER — Ambulatory Visit (INDEPENDENT_AMBULATORY_CARE_PROVIDER_SITE_OTHER): Payer: Medicare Other | Admitting: Nurse Practitioner

## 2011-04-18 VITALS — BP 112/60 | HR 61 | Ht 70.0 in | Wt 178.2 lb

## 2011-04-18 DIAGNOSIS — I498 Other specified cardiac arrhythmias: Secondary | ICD-10-CM

## 2011-04-18 DIAGNOSIS — I4891 Unspecified atrial fibrillation: Secondary | ICD-10-CM

## 2011-04-18 DIAGNOSIS — R251 Tremor, unspecified: Secondary | ICD-10-CM

## 2011-04-18 DIAGNOSIS — R001 Bradycardia, unspecified: Secondary | ICD-10-CM

## 2011-04-18 DIAGNOSIS — R259 Unspecified abnormal involuntary movements: Secondary | ICD-10-CM

## 2011-04-18 MED ORDER — LEVOTHYROXINE SODIUM 50 MCG PO TABS: ORAL_TABLET | ORAL | Status: DC

## 2011-04-18 NOTE — Assessment & Plan Note (Signed)
He remains in sinus.

## 2011-04-18 NOTE — Progress Notes (Signed)
[name] Date of Birth: 27-Jan-1922 Medical Record #[id number]  History of Present Illness: Mr. Gagen is seen today for a work in visit. He is seen for Dr. Daleen Squibb. He has had recent minimally invasive AVR by Dr. Silvestre Mesi at [facility] this past January. He has done quite well post op. He does have a history of atrial fib but no coumadin due to anemia, thrombocytopenia and falls. He is on chronic amiodarone and has had issues with bradycardia. He also has HTN, DM, tremor. Prior throat cancer, gout, GERD, hypothyroidism, CKD and DJD.  He comes in today. He is here with his wife. We stopped his diltiazem last week due to bradycardia that was noted at cardiac rehab as well after reporting some "fainty-like" spells at home. He has noticed heart rates down in the 40's. He has been 50 at rehab. It is sinus. He says he now feels just fine. Blood pressure and heart rate have been satisfactory since stopping the Diltiazem. He is planning on going [place] hunting at [employer] this weekend. No chest pain. Not short of breath. No swelling. Does have a significant tremor of his hands. Was apparently on some medicine in the past but does not know what it was. He does remain on amiodarone as well. He has returned to cardiac rehab as of this week.  Current Outpatient Prescriptions on File Prior to Visit  Medication Sig Dispense Refill  . allopurinol (ZYLOPRIM) 100 MG tablet Take 100 mg by mouth Daily.      [name] Kitchen amiodarone (PACERONE) 200 MG tablet Take 1 tablet (200 mg total) by mouth daily.  90 tablet  3  . aspirin 325 MG tablet Take 325 mg by mouth daily.        . Cholecalciferol (VITAMIN D) 1000 UNITS capsule Take 1 capsule (1,000 Units total) by mouth daily.  90 capsule  3  . fish oil-omega-3 fatty acids 1000 MG capsule Take 1 g by mouth daily.        . Flaxseed, Linseed, (FLAX SEED OIL) 1000 MG CAPS Take 1 capsule by mouth daily.       . furosemide (LASIX) 20 MG tablet Take 2 tablets (40 mg total) by mouth daily.  180  tablet  3  . LORazepam (ATIVAN) 0.5 MG tablet Take 1 tablet (0.5 mg total) by mouth at bedtime as needed. Pt takes 1-2 tablets daily prn sleep  30 tablet  2  . metoprolol succinate (TOPROL-XL) 25 MG 24 hr tablet Take 1 tablet (25 mg total) by mouth daily.  90 tablet  3  . Multiple Vitamin (MULTIVITAMIN) tablet Take 1 tablet by mouth daily.        . nitroGLYCERIN (NITROSTAT) 0.4 MG SL tablet Place 1 tablet (0.4 mg total) under the tongue every 5 (five) minutes as needed for chest pain.  25 tablet  6  . omeprazole (PRILOSEC) 20 MG capsule Take 20 mg by mouth daily.      . potassium chloride SA (K-DUR,KLOR-CON) 20 MEQ tablet Take 1.5 tablets (30 mEq total) by mouth daily. 1 1/2 by mouth daily  135 tablet  3  . vitamin C (ASCORBIC ACID) 500 MG tablet Take 1 tablet (500 mg total) by mouth daily.  90 tablet  3  . DISCONTD: colchicine 0.6 MG tablet Take one tablet by mouth daily during an acute episode of gout.  90 tablet  3  . DISCONTD: levothyroxine (SYNTHROID, LEVOTHROID) 50 MCG tablet Take 1 tablet (50 mcg) by mouth daily except  on Wednesdays take 1/2 tablet ( ) by mouth  90 tablet  60  . DISCONTD: colchicine 0.6 MG tablet Take one tablet by mouth daily during an acute attack of gout.  30 tablet  2    Allergies  Allergen Reactions  . Penicillins     Does not remember reaction (~year 1950)  . Triple Antibiotic     ? Reaction (thinks he remembers redness)    Past Medical History  Diagnosis Date  . Stroke   . Atrial fibrillation     amiodarone therapy  . Bradycardia   . Aortic stenosis     echo 10/12: EF 65-70%, grade 2 diast dysfxn, severe AS, mean gradient 60 mmHg, mod LAE, PASP 47;  s/p minimally invasive tissue AVR with Dr. Silvestre Mesi at [facility] 01/2011 (pre-AVR cath with no obs CAD)  . Hypertension   . Diabetes mellitus   . Anemia   . Action tremor   . Thrombocytopenia     Dr. [name]  . Throat cancer     s/p resection  . Colon polyp   . Anxiety   . Splenomegaly   . Gout   . GERD  (gastroesophageal reflux disease)   . Hypothyroidism   . Leukopenia     Chronic pancytopenia  . Degenerative disc disease   . Joint effusion, knee     left knee  . Synovial cyst of popliteal space   . Cellulitis of left leg 10/11-16/2012  . CKD (chronic kidney disease)     Past Surgical History  Procedure Date  . Cholecystectomy   . Joint effusion     left knee  . Aortic valve replacement 01/23/2011    via minimally invasive approach per Dr Silvestre Mesi, [facility]    History  Smoking status  . Former Smoker  . Quit date: 01/11/1962  Smokeless tobacco  . Not on file    History  Alcohol Use No    Family History  Problem Relation Age of Onset  . Colon cancer    . Stroke    . Cancer Other     colon  . Stroke Other     Review of Systems: The review of systems is per the HPI.  All other systems were reviewed and are negative.  Physical Exam: BP 112/60  Pulse 61  Ht 5\' 10"  (1.778 m)  Wt 178 lb 3.2 oz (80.831 kg)  BMI 25.57 kg/m2 Patient is very pleasant and in no acute distress. He looks younger than his stated age. Skin is warm and dry. Color is normal.  HEENT is unremarkable. Normocephalic/atraumatic. PERRL. Sclera are nonicteric. Neck is supple. No masses. No JVD. Lungs are clear. Cardiac exam shows a regular rate and rhythm. Soft outflow murmur noted. Incision over the right chest looks ok. Abdomen is soft. Extremities are without edema. Gait and ROM are intact. No gross neurologic deficits noted.  LABORATORY DATA: EKG today shows sinus rhythm. Rate is 61.   Assessment / Plan:

## 2011-04-18 NOTE — Assessment & Plan Note (Signed)
Patient presents with symptomatic bradycardia. His Diltiazem has been stopped. His symptoms have resolved. He remains in sinus rhythm. Blood pressure looks ok. I do not think we need to change his medicines further. He will continue to monitor at home. Will tentatively see him back at his regular time. Patient is agreeable to this plan and will call if any problems develop in the interim.

## 2011-04-18 NOTE — Patient Instructions (Signed)
Stay off the Diltiazem  Monitor your heart rate at home  Keep up with your cardiac rehab  We will see you back at your regular time with Dr. Daleen Squibb

## 2011-04-18 NOTE — Assessment & Plan Note (Signed)
Does have significant tremor of both hands. Probably worsened by his amiodarone. He has apparently been on some type of medicine in the past. His wife is going to check his pill bottles at home and let us know.

## 2011-04-19 ENCOUNTER — Telehealth: Payer: Self-pay | Admitting: Nurse Practitioner

## 2011-04-19 NOTE — Telephone Encounter (Signed)
Patient called was told spoke to Norma Fredrickson NP she advised start Propranolol 10 mg three times a day.Advised to call back if needed.

## 2011-04-19 NOTE — Telephone Encounter (Signed)
Patient wife Curtis Sites calling to report information requested by Dawayne Patricia.  @ 4/10 appnt  Patient previous medication- Propranolol HCL 10 MG tablet. She can be reached at hm# [phone] for additional information.

## 2011-04-19 NOTE — Telephone Encounter (Signed)
Patient called wanting to know can he restart propranolol 10 mg 3 tablets twice daily for tremors.States his tremors are getting worse.

## 2011-04-20 ENCOUNTER — Encounter (HOSPITAL_COMMUNITY)
Admission: RE | Admit: 2011-04-20 | Discharge: 2011-04-20 | Disposition: A | Discharge status: Home / Self Care | Payer: Medicare Other | Source: Ambulatory Visit | Attending: Cardiology | Admitting: Cardiology

## 2011-04-23 ENCOUNTER — Encounter (HOSPITAL_COMMUNITY)
Admission: RE | Admit: 2011-04-23 | Discharge: 2011-04-23 | Disposition: A | Discharge status: Home / Self Care | Payer: Medicare Other | Source: Ambulatory Visit | Attending: Cardiology | Admitting: Cardiology

## 2011-04-25 ENCOUNTER — Encounter (HOSPITAL_COMMUNITY)
Admission: RE | Admit: 2011-04-25 | Discharge: 2011-04-25 | Disposition: A | Discharge status: Home / Self Care | Payer: Medicare Other | Source: Ambulatory Visit | Attending: Cardiology | Admitting: Cardiology

## 2011-04-25 ENCOUNTER — Ambulatory Visit: Payer: Medicare Other | Admitting: Internal Medicine

## 2011-04-26 ENCOUNTER — Encounter: Payer: Self-pay | Admitting: Internal Medicine

## 2011-04-26 ENCOUNTER — Ambulatory Visit (INDEPENDENT_AMBULATORY_CARE_PROVIDER_SITE_OTHER): Payer: Medicare Other | Admitting: Internal Medicine

## 2011-04-26 VITALS — BP 114/60 | HR 52 | Wt 182.4 lb

## 2011-04-26 DIAGNOSIS — G4700 Insomnia, unspecified: Secondary | ICD-10-CM

## 2011-04-26 MED ORDER — LORAZEPAM 0.5 MG PO TABS: ORAL_TABLET | ORAL | Status: DC

## 2011-04-26 NOTE — Patient Instructions (Signed)
To prevent sleep dysfunction follow these instructions for sleep hygiene. Do not read, watch TV, or eat in bed. Do not get into bed until you are ready to turn off the light &  to go to sleep. Do not ingest stimulants ( decongestants, diet pills, nicotine, caffeine) after the evening meal.  

## 2011-04-26 NOTE — Progress Notes (Signed)
  Subjective:    Patient ID: [name], male    DOB: 08-05-1922, 78 y.o.   MRN: [id number]  HPI  On Jan 15th, pt had aortic valve replacement replacement performed at [facility]. Pt has had difficulty sleeping in the past however since the surgery, the difficulty has increased. Pt is having difficulty falling asleep and staying asleep. Pt states he sometimes does not fall asleep until 2am then he awakens one hour later. Pt has mild nocturia 1-2 times per night but pt states this is normal for him. Pt's wife states he breathes differently at night and only takes short shallow breaths and occasionally will stop breathing momentarily.   Pt denies nightmares  Pt states he sometimes has restless legs while in his chair but not while in bed.  Pt denies snoring.  Sleep hygiene: Pt does not have a TV in his room and does not read in bed.  Pt drinks one cup of decaf coffee per day.  Pt drinks alcohol rarely, only on vacation to the beach.  Pt denies naps during the day.  Pt states he was anxious for about 2 months after the surgery in January however the anxiety has improved.  Pt was prescribed a sleep medication about 2 months ago and pt used one bottle worth of medication and it seemed to help but there was a problem with renewing the prescription so the patient stopped taking the medication. Last dose was approximately 3 weeks ago.  Insomnia does not have an impact on daily activities.  Pt's appetite has improved.   Pt has been placed on ranitidine and propanolol (10mg  TID) approximately 2 weeks ago. Pt's HR is currently 52. Propanolol is primarily for the patient's fine hand tremors.     Review of Systems his wife describes "shallow breathing" but not true apnea. In reviewing her chart it appears that lorazepam 0. 5 mg 2 pills at bedtime was affective      Objective:   Physical Exam   He appears healthy and well-nourished. He does not appear fatigued.  Nares and oropharynx revealed no  obstruction.  Chest is clear without abnormal breath sounds  He has a regular rhythm with grade 1 systolic murmur.  No cyanosis, clubbing or present. He does have trace edema to midshin        Assessment & Plan:  #1 sleep disturbance ; possible sleep apnea but description is vague  Plan: Trial of lorazepam 1 mg at bedtime. His wife is asked to observe for any apnea. If she observes such, sleep study will be performed

## 2011-04-27 ENCOUNTER — Encounter (HOSPITAL_COMMUNITY)
Admission: RE | Admit: 2011-04-27 | Discharge: 2011-04-27 | Disposition: A | Discharge status: Home / Self Care | Payer: Medicare Other | Source: Ambulatory Visit | Attending: Cardiology | Admitting: Cardiology

## 2011-04-27 NOTE — Progress Notes (Signed)
Pt weight 83.8kg today at cardiac rehab up from 82.0kg on Monday.  Pt c/o mild dyspnea more than usual.  Pt reports pedal edema same as usual.  Lungs clear.  Pt states he thought he was supposed to increase his lasix dosage however is unsure about this.  PC to Dr. Vern Claude office. Per Dr. Vern Claude nurse, pt advised to take extra lasix 20mg  and KDUR today.  Continue daily weights.  Pt reports home weight today 175lb. Pt confirmed he has been taking lasix 40mg  daily at home, denies missed doses.  Pt verbalized understanding.  Will continue to monitor.

## 2011-04-30 ENCOUNTER — Encounter (HOSPITAL_COMMUNITY)
Admission: RE | Admit: 2011-04-30 | Discharge: 2011-04-30 | Disposition: A | Discharge status: Home / Self Care | Payer: Medicare Other | Source: Ambulatory Visit | Attending: Cardiology | Admitting: Cardiology

## 2011-04-30 ENCOUNTER — Telehealth: Payer: Self-pay | Admitting: *Deleted

## 2011-04-30 NOTE — Progress Notes (Signed)
Pt weight at cardiac rehab today 83.4kg.  Pt reports home weight 176lb today. Pt states dyspnea slightly improved.  Pt took additional lasix 20mg  and KDur on Friday and Sat.  Spoke to [employer], Dr. Vern Claude nurse.  She advised pt to continue additional lasix and KDur until weight restored to usual.  Pt informed, understanding verbalized.

## 2011-04-30 NOTE — Telephone Encounter (Signed)
[name] with Cardiac rehab calls today b/c pt weight is still up 1 pound. His shortness of breath has iimproved since taking extra dose of lasix on Friday and Saturday along with extra potassium. Weight is 176.  He will take 1 extra lasix today. Walter Red RN

## 2011-05-02 ENCOUNTER — Telehealth: Payer: Self-pay | Admitting: *Deleted

## 2011-05-02 ENCOUNTER — Telehealth (HOSPITAL_COMMUNITY): Payer: Self-pay | Admitting: Cardiac Rehabilitation

## 2011-05-02 ENCOUNTER — Encounter (HOSPITAL_COMMUNITY)
Admission: RE | Admit: 2011-05-02 | Discharge: 2011-05-02 | Disposition: A | Discharge status: Home / Self Care | Payer: Medicare Other | Source: Ambulatory Visit | Attending: Cardiology | Admitting: Cardiology

## 2011-05-02 NOTE — Telephone Encounter (Signed)
Spoke to Dr. Vern Claude triage nurse to discuss pt weight, improved dyspnea.  Pt home weight has consistently been 175lb and cardiac rehab wt consistently 182lb  Even with additional lasix 20mg   Daily.  Pt reports dyspnea improved, lower extremity edema same as usual, pt denies orthopnea or cough. Per Norma Fredrickson, NP pt advised stop additional lasix 20mg  and continue lasix 40mg  daily.  Will continue to monitor.  Pt verbalized understanding

## 2011-05-02 NOTE — Telephone Encounter (Signed)
Cardiac rehab called for advise stating pts weight has not decreased with the additional lasix 20 mg in afternoon along with lasix 40 mg in am. Pt states he is feeling better, denies cough, denies change to usual pedal edema, denies worsening SOB, wt today 182 lb,  4/22 182 lb,  4/19 180 lb, 4/17 180 lb, 4/15 180 lb, Nurse questions if he is to continue taking additional lasix and k+ since he is feeling better, Dr Daleen Squibb out of office, Spoke with Norma Fredrickson NP pt is to stop lasix 20 mg noon dose and go back to original 40 mg daily, Randa Evens was called back and will inform the pt, pt to call with further questions.

## 2011-05-04 ENCOUNTER — Encounter (HOSPITAL_COMMUNITY)
Admission: RE | Admit: 2011-05-04 | Discharge: 2011-05-04 | Disposition: A | Discharge status: Home / Self Care | Payer: Medicare Other | Source: Ambulatory Visit | Attending: Cardiology | Admitting: Cardiology

## 2011-05-07 ENCOUNTER — Encounter (HOSPITAL_COMMUNITY)
Admission: RE | Admit: 2011-05-07 | Discharge: 2011-05-07 | Disposition: A | Discharge status: Home / Self Care | Payer: Medicare Other | Source: Ambulatory Visit | Attending: Cardiology | Admitting: Cardiology

## 2011-05-08 ENCOUNTER — Encounter: Payer: Self-pay | Admitting: Nurse Practitioner

## 2011-05-08 ENCOUNTER — Ambulatory Visit (INDEPENDENT_AMBULATORY_CARE_PROVIDER_SITE_OTHER): Payer: Medicare Other | Admitting: Nurse Practitioner

## 2011-05-08 ENCOUNTER — Telehealth: Payer: Self-pay | Admitting: Cardiology

## 2011-05-08 VITALS — BP 108/70 | HR 77 | Ht 70.0 in | Wt 180.0 lb

## 2011-05-08 DIAGNOSIS — J069 Acute upper respiratory infection, unspecified: Secondary | ICD-10-CM

## 2011-05-08 DIAGNOSIS — I4891 Unspecified atrial fibrillation: Secondary | ICD-10-CM

## 2011-05-08 MED ORDER — AMIODARONE HCL 200 MG PO TABS: 200.0000 mg | ORAL_TABLET | Freq: Two times a day (BID) | ORAL | Status: DC

## 2011-05-08 MED ORDER — DOXYCYCLINE HYCLATE 100 MG PO TABS: 100.0000 mg | ORAL_TABLET | Freq: Two times a day (BID) | ORAL | Status: AC

## 2011-05-08 NOTE — Telephone Encounter (Signed)
Patient request return call at [phone]  Patient woke up in the middle of the night with irregular heart beat.  (BP 100/43 HR 100) Patient also notes valve replacement in 01/2011 ([facility]) and cold over the last couple of days. No SOB or Dizziness at this time. Please return call to advise.

## 2011-05-08 NOTE — Assessment & Plan Note (Addendum)
Unfortunately, [name] has gone back into atrial fib. His rate is controlled. He is not all that symptomatic and is more bothered by his cold/URI. He does not appear to be volume overloaded.  He is not a candidate for coumadin and his risk of stroke is increased. He does understand and accept this. I have spoken to Dr. Graciela Husbands regarding his situation. We are going to treat his URI. I am going to give him a week of Doxycycline 100 mg BID. He is PCN allergic and I did not want to use a quinolone with the amiodarone on board. We are going to increase his amiodarone with hopes of restoring sinus rhythm to BID. He does have moderate LAE and this may be futile. I have also explained that this may aggravate his tremor as well.  I will see him back in about 2 weeks. If he remains in atrial fib, then we will stop the amiodarone and just focus on rate control. We will also check baseline labs today as well. Patient and his wife are agreeable to this plan and will call if any problems develop in the interim.

## 2011-05-08 NOTE — Patient Instructions (Signed)
I am going to check some labs today  We are going to put you on some antibiotics - Doxycycline 100 mg two times a day for one week  Use Robitussin or Mucinex as needed. Stay hydrated.  Increase your amiodarone to two times a day  I will see you back in about 2 weeks.  Call the [facility] office at [phone] if you have any questions, problems or concerns.

## 2011-05-08 NOTE — Progress Notes (Signed)
[name] Date of Birth: 10-Jun-1922 Medical Record #[id number]  History of Present Illness: Mr. Pianka is seen back today for a work in visit. He is seen for Dr. Daleen Squibb. He had recent minimally invasive AVR by Dr. Silvestre Mesi at [facility] this past January. He did quite well with some exception of some bradycardia. He does have PAF and is on amiodarone. No longer on his Diltiazem due to bradycardia. He has been deemed not a candidate for coumadin due to anemia, thrombocytopenia and history of falls. His other issues include HTN, DM, tremor, prior throat cancer, gout, GERD, hypothyroidism, CKD and DJD. He has had a follow up echo back in February. He does have moderate LAE.  He comes in today. He is here with his wife. Says he is not really feeling all that bad from his heart. Has gotten a bad cold with cough and congestion. Not really anymore short of breath. No fever or chills. Did just get back from [place] hunting at the beach this past weekend. Is back on some propranolol for his tremor with good result. This morning he woke up about 3 am and noted that his heart was back out of rhythm. Not really dizzy or lightheaded. No chest pain. His swelling in his legs has improved with time.   Current Outpatient Prescriptions on File Prior to Visit  Medication Sig Dispense Refill  . allopurinol (ZYLOPRIM) 100 MG tablet Take 100 mg by mouth Daily.      [name] Kitchen amiodarone (PACERONE) 200 MG tablet Take 1 tablet (200 mg total) by mouth daily.  90 tablet  3  . aspirin 325 MG tablet Take 325 mg by mouth daily.        . Cholecalciferol (VITAMIN D) 1000 UNITS capsule Take 1 capsule (1,000 Units total) by mouth daily.  90 capsule  3  . colchicine 0.6 MG tablet as needed. Take one tablet by mouth daily during an acute episode of gout.      . fish oil-omega-3 fatty acids 1000 MG capsule Take 1 g by mouth daily.        . Flaxseed, Linseed, (FLAX SEED OIL) 1000 MG CAPS Take 1 capsule by mouth daily.       . furosemide (LASIX) 20  MG tablet Take 2 tablets (40 mg total) by mouth daily.  180 tablet  3  . levothyroxine (SYNTHROID, LEVOTHROID) 50 MCG tablet Take 1 tablet (50 mcg) by mouth daily except on Wednesdays take 1/2 tablet ( ) by mouth  30 tablet  1  . LORazepam (ATIVAN) 0.5 MG tablet 1 qhs  prn  30 tablet  2  . metoprolol succinate (TOPROL-XL) 25 MG 24 hr tablet Take 1 tablet (25 mg total) by mouth daily.  90 tablet  3  . Multiple Vitamin (MULTIVITAMIN) tablet Take 1 tablet by mouth daily.        . nitroGLYCERIN (NITROSTAT) 0.4 MG SL tablet Place 1 tablet (0.4 mg total) under the tongue every 5 (five) minutes as needed for chest pain.  25 tablet  6  . omeprazole (PRILOSEC) 20 MG capsule Take 20 mg by mouth daily.      . potassium chloride SA (K-DUR,KLOR-CON) 20 MEQ tablet Take 1.5 tablets (30 mEq total) by mouth daily. 1 1/2 by mouth daily  135 tablet  3  . propranolol (INDERAL) 10 MG tablet Take 10 mg by mouth 3 (three) times daily.      . ranitidine (ZANTAC) 150 MG tablet Take 150 mg by mouth 2 (  two) times daily.      . vitamin C (ASCORBIC ACID) 500 MG tablet Take 1 tablet (500 mg total) by mouth daily.  90 tablet  3    Allergies  Allergen Reactions  . Penicillins     Does not remember reaction (~year 1950)  . Neomycin-Bacitracin Zn-Polymyx     ? Reaction (thinks he remembers redness)    Past Medical History  Diagnosis Date  . Stroke   . Atrial fibrillation     amiodarone therapy  . Bradycardia   . Aortic stenosis     echo 10/12: EF 65-70%, grade 2 diast dysfxn, severe AS, mean gradient 60 mmHg, mod LAE, PASP 47;  s/p minimally invasive tissue AVR with Dr. Silvestre Mesi at [facility] 01/2011 (pre-AVR cath with no obs CAD)  . Hypertension   . Diabetes mellitus   . Anemia   . Action tremor   . Thrombocytopenia     Dr. [name]  . Throat cancer     s/p resection  . Colon polyp   . Anxiety   . Splenomegaly   . Gout   . GERD (gastroesophageal reflux disease)   . Hypothyroidism   . Leukopenia     Chronic  pancytopenia  . Degenerative disc disease   . Joint effusion, knee     left knee  . Synovial cyst of popliteal space   . Cellulitis of left leg 10/11-16/2012  . CKD (chronic kidney disease)   . Atrial flutter 05/08/2011    Past Surgical History  Procedure Date  . Cholecystectomy   . Joint effusion     left knee  . Aortic valve replacement 01/23/2011    via minimally invasive approach per Dr Silvestre Mesi, [facility]    History  Smoking status  . Former Smoker  . Quit date: 01/11/1962  Smokeless tobacco  . Not on file    History  Alcohol Use No    Family History  Problem Relation Age of Onset  . Colon cancer    . Stroke    . Cancer Other     colon  . Stroke Other     Review of Systems: The review of systems is positive for URI with cough and congestion.  All other systems were reviewed and are negative.  Physical Exam: BP 108/70  Pulse 77  Ht 5\' 10"  (1.778 m)  Wt 180 lb (81.647 kg)  BMI 25.83 kg/m2 Patient is an elderly white male who is in no acute distress. Sounds a little hoarse and nasally congested. Skin is warm and dry. His arms and legs are very dark in color - almost bluish. Color is normal.  HEENT is unremarkable. Normocephalic/atraumatic. PERRL. Sclera are nonicteric. Neck is supple. No masses. No JVD. Lungs are fairly clear. Cardiac exam shows an irregular rate and rhythm. He has an outflow murmur.  Abdomen is soft. Extremities are without edema. Gait and ROM are intact. No gross neurologic deficits noted.   LABORATORY DATA: His EKG today shows recurrent atrial fib. He has a controlled ventricular response.   BMET and CBC are pending  Lab Results  Component Value Date   WBC 4.2 03/05/2011   HGB 12.5* 03/05/2011   HCT 36.5* 03/05/2011   PLT 84* 03/05/2011   GLUCOSE 122* 03/05/2011   ALT 19 01/12/2011   AST 32 01/12/2011   NA 140 03/05/2011   K 3.9 03/05/2011   CL 104 03/05/2011   CREATININE 1.41* 03/05/2011   BUN 32* 03/05/2011   CO2 25 03/05/2011  TSH 0.62  02/27/2011   INR 1.27 10/20/2010   HGBA1C 6.0 10/18/2010    Echo Study Conclusions (February 2013)  - Left ventricle: The cavity size was normal. Wall thickness was increased in a pattern of moderate LVH. The estimated ejection fraction was 60%. Wall motion was normal; there were no regional wall motion abnormalities. Doppler parameters are consistent with high ventricular filling pressure. - Aortic valve: There is a prosthetic valve in the aortic postion. There is no AS. There is an AI signal that is vey eccentric. This may be a perivalvular leak. - Left atrium: The atrium was moderately dilated. - Right ventricle: The cavity size was mildly dilated. - Right atrium: The atrium was moderately dilated. - Tricuspid valve: Mild-moderate regurgitation. - Pulmonary arteries: PA peak pressure: 42mm Hg (S).    Assessment / Plan:

## 2011-05-08 NOTE — Telephone Encounter (Signed)
States he has had hx of afib but not since valve replacement, woke up at 3 am with heart racing, denies cp/sob/dizziness. Took bp a couple times- 100/43 p 100  114/53 p 83 c/o fatigue and having cold like symptoms/ cough, chills. Pt request to be seen, app set today.

## 2011-05-09 ENCOUNTER — Encounter (HOSPITAL_COMMUNITY)
Admission: RE | Admit: 2011-05-09 | Discharge: 2011-05-09 | Disposition: A | Discharge status: Home / Self Care | Payer: Medicare Other | Source: Ambulatory Visit | Attending: Cardiology | Admitting: Cardiology

## 2011-05-09 DIAGNOSIS — Z5189 Encounter for other specified aftercare: Secondary | ICD-10-CM | POA: Insufficient documentation

## 2011-05-09 DIAGNOSIS — Z8679 Personal history of other diseases of the circulatory system: Secondary | ICD-10-CM | POA: Insufficient documentation

## 2011-05-09 DIAGNOSIS — I2510 Atherosclerotic heart disease of native coronary artery without angina pectoris: Secondary | ICD-10-CM | POA: Insufficient documentation

## 2011-05-09 DIAGNOSIS — I129 Hypertensive chronic kidney disease with stage 1 through stage 4 chronic kidney disease, or unspecified chronic kidney disease: Secondary | ICD-10-CM | POA: Insufficient documentation

## 2011-05-09 DIAGNOSIS — K219 Gastro-esophageal reflux disease without esophagitis: Secondary | ICD-10-CM | POA: Insufficient documentation

## 2011-05-09 DIAGNOSIS — I10 Essential (primary) hypertension: Secondary | ICD-10-CM | POA: Insufficient documentation

## 2011-05-09 DIAGNOSIS — I5030 Unspecified diastolic (congestive) heart failure: Secondary | ICD-10-CM | POA: Insufficient documentation

## 2011-05-09 DIAGNOSIS — I509 Heart failure, unspecified: Secondary | ICD-10-CM | POA: Insufficient documentation

## 2011-05-09 DIAGNOSIS — N189 Chronic kidney disease, unspecified: Secondary | ICD-10-CM | POA: Insufficient documentation

## 2011-05-09 DIAGNOSIS — E039 Hypothyroidism, unspecified: Secondary | ICD-10-CM | POA: Insufficient documentation

## 2011-05-09 DIAGNOSIS — I4891 Unspecified atrial fibrillation: Secondary | ICD-10-CM | POA: Insufficient documentation

## 2011-05-09 DIAGNOSIS — I080 Rheumatic disorders of both mitral and aortic valves: Secondary | ICD-10-CM | POA: Insufficient documentation

## 2011-05-09 LAB — CBC WITH DIFFERENTIAL/PLATELET
Basophils Absolute: 0 10*3/uL (ref 0.0–0.1)
Basophils Relative: 0.1 % (ref 0.0–3.0)
Eosinophils Absolute: 0.1 10*3/uL (ref 0.0–0.7)
Eosinophils Relative: 1.3 % (ref 0.0–5.0)
HCT: 34.4 % — ABNORMAL LOW (ref 39.0–52.0)
Hemoglobin: 11.6 g/dL — ABNORMAL LOW (ref 13.0–17.0)
Lymphocytes Relative: 16.5 % (ref 12.0–46.0)
Lymphs Abs: 0.7 10*3/uL (ref 0.7–4.0)
MCHC: 33.8 g/dL (ref 30.0–36.0)
MCV: 87.5 fl (ref 78.0–100.0)
Monocytes Absolute: 0.3 10*3/uL (ref 0.1–1.0)
Monocytes Relative: 7.3 % (ref 3.0–12.0)
Neutro Abs: 3.1 10*3/uL (ref 1.4–7.7)
Neutrophils Relative %: 74.8 % (ref 43.0–77.0)
Platelets: 67 10*3/uL — ABNORMAL LOW (ref 150.0–400.0)
RBC: 3.93 Mil/uL — ABNORMAL LOW (ref 4.22–5.81)
RDW: 18.6 % — ABNORMAL HIGH (ref 11.5–14.6)
WBC: 4.1 10*3/uL — ABNORMAL LOW (ref 4.5–10.5)

## 2011-05-09 LAB — BASIC METABOLIC PANEL
BUN: 42 mg/dL — ABNORMAL HIGH (ref 6–23)
CO2: 23 mEq/L (ref 19–32)
Calcium: 9 mg/dL (ref 8.4–10.5)
Chloride: 108 mEq/L (ref 96–112)
Creatinine, Ser: 1.8 mg/dL — ABNORMAL HIGH (ref 0.4–1.5)
GFR: 39.37 mL/min — ABNORMAL LOW (ref >60.00)
Glucose, Bld: 107 mg/dL — ABNORMAL HIGH (ref 70–99)
Potassium: 4.6 mEq/L (ref 3.5–5.1)
Sodium: 141 mEq/L (ref 135–145)

## 2011-05-11 ENCOUNTER — Encounter (HOSPITAL_COMMUNITY)
Admission: RE | Admit: 2011-05-11 | Discharge: 2011-05-11 | Disposition: A | Discharge status: Home / Self Care | Payer: Medicare Other | Source: Ambulatory Visit | Attending: Cardiology | Admitting: Cardiology

## 2011-05-14 ENCOUNTER — Encounter (HOSPITAL_COMMUNITY)
Admission: RE | Admit: 2011-05-14 | Discharge: 2011-05-14 | Disposition: A | Discharge status: Home / Self Care | Payer: Medicare Other | Source: Ambulatory Visit | Attending: Cardiology | Admitting: Cardiology

## 2011-05-14 ENCOUNTER — Telehealth: Payer: Self-pay | Admitting: *Deleted

## 2011-05-14 DIAGNOSIS — I2510 Atherosclerotic heart disease of native coronary artery without angina pectoris: Secondary | ICD-10-CM

## 2011-05-14 NOTE — Telephone Encounter (Signed)
Received call from [facility] at Cardiac Rehab. Pt is at rehab today and wt is up to 184 lbs. Wt was 180 lbs when he saw Norma Fredrickson, NP on May 08, 2011. Pt weighs self daily and reports wt is up 1-2 lbs from usual home wt.  Has increased edema in lower extremities.  No shortness of breath. Reports more fatigued than usual.  Was able to exercise at rehab today. In SR now. Blood pressure 140/82 and heart rate in 60's.  Pt takes Lasix 40 daily and KCl 30 meq daily. Will review with Norma Fredrickson, NP

## 2011-05-14 NOTE — Progress Notes (Signed)
Pt arrived at cardiac rehab c/o increased lower extremity edema and fatigue.  Pt denies dyspnea, lungs are clear.  Pt is wearing compression hose, trace-1+ edema present in shins.  Pt weight at cardiac rehab 84.1kg up from 82.3kg last week.  Pt reports home weight 176lb.  Pt states he is currently taking lasix 40mg  qAM.  pc to Dr. Vern Claude triage nurse, who will discuss with Norma Fredrickson, NP in Dr. Vern Claude absence and advise pt.  Pt verbalized understanding

## 2011-05-14 NOTE — Telephone Encounter (Signed)
May increase Lasix to 40 mg BID for 3 days and double his potassium as well. Then resume prior dose. Check BMET on return visit.

## 2011-05-14 NOTE — Telephone Encounter (Signed)
Spoke with pt's wife. Pt is on way home from cardiac rehab and she would like me to give instructions to her.  Wife given instructions from Norma Fredrickson, NP.   She also reports pt still has cough and little improvement in URI symptoms.  I asked her to contact primary care for these symptoms and she is agreeable with this plan.

## 2011-05-16 ENCOUNTER — Encounter (HOSPITAL_COMMUNITY)
Admission: RE | Admit: 2011-05-16 | Discharge: 2011-05-16 | Disposition: A | Discharge status: Home / Self Care | Payer: Medicare Other | Source: Ambulatory Visit | Attending: Cardiology | Admitting: Cardiology

## 2011-05-18 ENCOUNTER — Encounter (HOSPITAL_COMMUNITY): Payer: Medicare Other

## 2011-05-21 ENCOUNTER — Encounter (HOSPITAL_COMMUNITY)
Admission: RE | Admit: 2011-05-21 | Discharge: 2011-05-21 | Disposition: A | Discharge status: Home / Self Care | Payer: Medicare Other | Source: Ambulatory Visit | Attending: Cardiology | Admitting: Cardiology

## 2011-05-22 ENCOUNTER — Encounter: Payer: Self-pay | Admitting: Nurse Practitioner

## 2011-05-22 ENCOUNTER — Ambulatory Visit (INDEPENDENT_AMBULATORY_CARE_PROVIDER_SITE_OTHER): Payer: Medicare Other | Admitting: Nurse Practitioner

## 2011-05-22 ENCOUNTER — Other Ambulatory Visit: Payer: Medicare Other

## 2011-05-22 VITALS — BP 150/68 | HR 49 | Ht 70.0 in | Wt 177.0 lb

## 2011-05-22 DIAGNOSIS — I509 Heart failure, unspecified: Secondary | ICD-10-CM

## 2011-05-22 DIAGNOSIS — I4891 Unspecified atrial fibrillation: Secondary | ICD-10-CM

## 2011-05-22 DIAGNOSIS — I5032 Chronic diastolic (congestive) heart failure: Secondary | ICD-10-CM

## 2011-05-22 DIAGNOSIS — E039 Hypothyroidism, unspecified: Secondary | ICD-10-CM

## 2011-05-22 DIAGNOSIS — I10 Essential (primary) hypertension: Secondary | ICD-10-CM

## 2011-05-22 DIAGNOSIS — I2510 Atherosclerotic heart disease of native coronary artery without angina pectoris: Secondary | ICD-10-CM

## 2011-05-22 LAB — BASIC METABOLIC PANEL
BUN: 56 mg/dL — ABNORMAL HIGH (ref 6–23)
CO2: 22 mEq/L (ref 19–32)
Calcium: 9.3 mg/dL (ref 8.4–10.5)
Chloride: 109 mEq/L (ref 96–112)
Creatinine, Ser: 2 mg/dL — ABNORMAL HIGH (ref 0.4–1.5)
GFR: 33.75 mL/min — ABNORMAL LOW (ref >60.00)
Glucose, Bld: 115 mg/dL — ABNORMAL HIGH (ref 70–99)
Potassium: 4.7 mEq/L (ref 3.5–5.1)
Sodium: 141 mEq/L (ref 135–145)

## 2011-05-22 LAB — TSH: TSH: 0.09 u[IU]/mL — ABNORMAL LOW (ref 0.35–5.50)

## 2011-05-22 NOTE — Assessment & Plan Note (Signed)
He is back in sinus rhythm. He remains on low dose amiodarone and so we will continue for now. Heart rates averaging in the mid to low 50's at home. He goes to rehab and is monitored there as well. I have left him on his current regimen. Unfortunately, his stroke risk remains high. He is not a candidate for coumadin. We will be rechecking labs today and will see him back in about a month. Patient is agreeable to this plan and will call if any problems develop in the interim.

## 2011-05-22 NOTE — Progress Notes (Signed)
[name] Date of Birth: 12-02-1922 Medical Record #[id number]  History of Present Illness: [name] is seen today for a 2 week check. He is seen for Dr. Daleen Squibb. He is 78 years of age and remains quite active. He had minimally invasive AVR by Dr. Silvestre Mesi at [facility] this past January. Does have PAF and is on amiodarone. No longer on Diltiazem due to bradycardia. Not a candidate for coumadin given his anemia, thrombocytopenia and history of falls. Other issues include HTN, DM, tremor, prior throat cancer, gout, GERD, hypothyroidism, CKD and DJD.   I saw him 2 weeks ago and he was back in atrial fib and had a URI that we treated with Doxycycline. We increased his amiodarone to BID. He has had some issues with weight gain and fluid retention that were treated with some extra Lasix.  He has had his last echo in February of this year.   He comes in today. He is here alone. He has been back [place] hunting this past weekend. His cold has improved. He is finished with the antibiotics. He took some extra Lasix for a couple of days. His weight is back down. Blood pressure at home is a little elevated in the morning but this is before he takes any medicines and he does not check it further during the course of the day. No apparent issues at rehab reported with his blood pressure. He does tell me that the amiodarone got cut back by the rehab girls. He went back into a regular rhythm this past Saturday. His heart rate is in the low 50's. No chest pain. Not dizzy or lightheaded. No syncope reported. Overall, he feels like he has improved.   Current Outpatient Prescriptions on File Prior to Visit  Medication Sig Dispense Refill  . allopurinol (ZYLOPRIM) 100 MG tablet Take 100 mg by mouth Daily.      [name] Kitchen aspirin 325 MG tablet Take 325 mg by mouth daily.        . Cholecalciferol (VITAMIN D) 1000 UNITS capsule Take 1 capsule (1,000 Units total) by mouth daily.  90 capsule  3  . colchicine 0.6 MG tablet as needed. Take  one tablet by mouth daily during an acute episode of gout.      [name] Kitchen doxycycline (VIBRA-TABS) 100 MG tablet Take 1 tablet (100 mg total) by mouth 2 (two) times daily.  14 tablet  0  . fish oil-omega-3 fatty acids 1000 MG capsule Take 1 g by mouth daily.        . Flaxseed, Linseed, (FLAX SEED OIL) 1000 MG CAPS Take 1 capsule by mouth daily.       . furosemide (LASIX) 20 MG tablet Take 2 tablets (40 mg total) by mouth daily.  180 tablet  3  . levothyroxine (SYNTHROID, LEVOTHROID) 50 MCG tablet Take 1 tablet (50 mcg) by mouth daily except on Wednesdays take 1/2 tablet ( ) by mouth  30 tablet  1  . LORazepam (ATIVAN) 0.5 MG tablet 1 qhs  prn  30 tablet  2  . metoprolol succinate (TOPROL-XL) 25 MG 24 hr tablet Take 1 tablet (25 mg total) by mouth daily.  90 tablet  3  . Multiple Vitamin (MULTIVITAMIN) tablet Take 1 tablet by mouth daily.        . nitroGLYCERIN (NITROSTAT) 0.4 MG SL tablet Place 1 tablet (0.4 mg total) under the tongue every 5 (five) minutes as needed for chest pain.  25 tablet  6  . omeprazole (PRILOSEC) 20  MG capsule Take 20 mg by mouth daily.      . potassium chloride SA (K-DUR,KLOR-CON) 20 MEQ tablet Take 1.5 tablets (30 mEq total) by mouth daily. 1 1/2 by mouth daily  135 tablet  3  . propranolol (INDERAL) 10 MG tablet Take 10 mg by mouth 3 (three) times daily.      . ranitidine (ZANTAC) 150 MG tablet Take 150 mg by mouth 2 (two) times daily.      . vitamin C (ASCORBIC ACID) 500 MG tablet Take 1 tablet (500 mg total) by mouth daily.  90 tablet  3  . DISCONTD: amiodarone (PACERONE) 200 MG tablet Take 1 tablet (200 mg total) by mouth 2 (two) times daily.  90 tablet  3    Allergies  Allergen Reactions  . Penicillins     Does not remember reaction (~year 1950)  . Neomycin-Bacitracin Zn-Polymyx     ? Reaction (thinks he remembers redness)    Past Medical History  Diagnosis Date  . Stroke   . Atrial fibrillation     amiodarone therapy; not a candidate for anticoagulation  .  Bradycardia   . Aortic stenosis     echo 10/12: EF 65-70%, grade 2 diast dysfxn, severe AS, mean gradient 60 mmHg, mod LAE, PASP 47;  s/p minimally invasive tissue AVR with Dr. Silvestre Mesi at [facility] 01/2011 (pre-AVR cath with no obs CAD)  . Hypertension   . Diabetes mellitus   . Anemia   . Action tremor   . Thrombocytopenia     Dr. [name]  . Throat cancer     s/p resection  . Colon polyp   . Anxiety   . Splenomegaly   . Gout   . GERD (gastroesophageal reflux disease)   . Hypothyroidism   . Leukopenia     Chronic pancytopenia  . Degenerative disc disease   . Joint effusion, knee     left knee  . Synovial cyst of popliteal space   . Cellulitis of left leg 10/11-16/2012  . CKD (chronic kidney disease)     Past Surgical History  Procedure Date  . Cholecystectomy   . Joint effusion     left knee  . Aortic valve replacement 01/23/2011    via minimally invasive approach per Dr Silvestre Mesi, [facility]    History  Smoking status  . Former Smoker  . Quit date: 01/11/1962  Smokeless tobacco  . Not on file    History  Alcohol Use No    Family History  Problem Relation Age of Onset  . Colon cancer    . Stroke    . Cancer Other     colon  . Stroke Other     Review of Systems: The review of systems is per the HPI.  He brings me a note from Dr. Alwyn Ren to get his TSH rechecked. All other systems were reviewed and are negative.  Physical Exam: BP 150/68  Pulse 49  Ht 5\' 10"  (1.778 m)  Wt 177 lb (80.287 kg)  BMI 25.40 kg/m2 Patient is very pleasant and in no acute distress. Skin is warm and dry. His extremities have stasis changes - almost a bluish hue. He is ecchymotic. Color is normal.  HEENT is unremarkable. Normocephalic/atraumatic. PERRL. Sclera are nonicteric. Neck is supple. No masses. No JVD. Lungs are clear. Cardiac exam shows a regular rate and rhythm.Soft outflow murmur noted. Abdomen is soft. Extremities are without significant edema. Gait and ROM are intact. No gross  neurologic deficits noted.  LABORATORY DATA: PENDING.  EKG today shows sinus bradycardia. Rate is 49.    Assessment / Plan:

## 2011-05-22 NOTE — Assessment & Plan Note (Signed)
His weights are back down. He looks compensated. I will see him back in a month.

## 2011-05-22 NOTE — Patient Instructions (Signed)
Call the [facility] about your lab appointment for next week. Tell them that you have had some labs here with [place].  I want you to stay on your current medicines  I will see you in a month.  Call the [facility] office at [phone] if you have any questions, problems or concerns.

## 2011-05-22 NOTE — Assessment & Plan Note (Signed)
His morning readings before taking any medicines are up a little. I have asked him to check some readings during the course of the day. He will also be monitored at rehab. For now, no change in his current regimen.

## 2011-05-23 ENCOUNTER — Other Ambulatory Visit: Payer: Self-pay | Admitting: *Deleted

## 2011-05-23 ENCOUNTER — Encounter (HOSPITAL_COMMUNITY)
Admission: RE | Admit: 2011-05-23 | Discharge: 2011-05-23 | Disposition: A | Discharge status: Home / Self Care | Payer: Medicare Other | Source: Ambulatory Visit | Attending: Cardiology | Admitting: Cardiology

## 2011-05-23 DIAGNOSIS — E0590 Thyrotoxicosis, unspecified without thyrotoxic crisis or storm: Secondary | ICD-10-CM

## 2011-05-23 DIAGNOSIS — E039 Hypothyroidism, unspecified: Secondary | ICD-10-CM

## 2011-05-23 DIAGNOSIS — I10 Essential (primary) hypertension: Secondary | ICD-10-CM

## 2011-05-23 MED ORDER — LEVOTHYROXINE SODIUM 25 MCG PO TABS: 25.0000 ug | ORAL_TABLET | Freq: Every day | ORAL | Status: DC

## 2011-05-25 ENCOUNTER — Encounter (HOSPITAL_COMMUNITY)
Admission: RE | Admit: 2011-05-25 | Discharge: 2011-05-25 | Disposition: A | Discharge status: Home / Self Care | Payer: Medicare Other | Source: Ambulatory Visit | Attending: Cardiology | Admitting: Cardiology

## 2011-05-28 ENCOUNTER — Encounter (HOSPITAL_COMMUNITY)
Admission: RE | Admit: 2011-05-28 | Discharge: 2011-05-28 | Disposition: A | Discharge status: Home / Self Care | Payer: Medicare Other | Source: Ambulatory Visit | Attending: Cardiology | Admitting: Cardiology

## 2011-05-29 ENCOUNTER — Other Ambulatory Visit (HOSPITAL_BASED_OUTPATIENT_CLINIC_OR_DEPARTMENT_OTHER): Payer: Medicare Other | Admitting: Lab

## 2011-05-29 DIAGNOSIS — D696 Thrombocytopenia, unspecified: Secondary | ICD-10-CM

## 2011-05-29 LAB — CBC WITH DIFFERENTIAL/PLATELET
EOS%: 1.3 % (ref 0.0–7.0)
LYMPH%: 17.2 % (ref 14.0–49.0)
MCH: 29.5 pg (ref 27.2–33.4)
MCV: 88.1 fL (ref 79.3–98.0)
MONO%: 5.4 % (ref 0.0–14.0)
RBC: 3.94 10*6/uL — ABNORMAL LOW (ref 4.20–5.82)
RDW: 17.1 % — ABNORMAL HIGH (ref 11.0–14.6)
nRBC: 0 % (ref 0–0)

## 2011-05-30 ENCOUNTER — Encounter (HOSPITAL_COMMUNITY)
Admission: RE | Admit: 2011-05-30 | Discharge: 2011-05-30 | Disposition: A | Discharge status: Home / Self Care | Payer: Medicare Other | Source: Ambulatory Visit | Attending: Cardiology | Admitting: Cardiology

## 2011-06-01 ENCOUNTER — Encounter (HOSPITAL_COMMUNITY): Payer: Medicare Other

## 2011-06-01 ENCOUNTER — Telehealth: Payer: Self-pay | Admitting: *Deleted

## 2011-06-01 NOTE — Telephone Encounter (Signed)
Left VM with CBC results--stable and follow up as scheduled.

## 2011-06-01 NOTE — Telephone Encounter (Signed)
Message copied by Wandalee Ferdinand on Fri Jun 01, 2011 12:42 PM ------      Message from: Thornton Papas B      Created: Thu May 31, 2011  9:57 PM       Please call patient, hb/plts are stable, f/u as scheduled

## 2011-06-04 ENCOUNTER — Encounter (HOSPITAL_COMMUNITY): Payer: Medicare Other

## 2011-06-06 ENCOUNTER — Encounter (HOSPITAL_COMMUNITY)
Admission: RE | Admit: 2011-06-06 | Discharge: 2011-06-06 | Disposition: A | Discharge status: Home / Self Care | Payer: Medicare Other | Source: Ambulatory Visit | Attending: Cardiology | Admitting: Cardiology

## 2011-06-06 ENCOUNTER — Other Ambulatory Visit (INDEPENDENT_AMBULATORY_CARE_PROVIDER_SITE_OTHER): Payer: Medicare Other

## 2011-06-06 DIAGNOSIS — I10 Essential (primary) hypertension: Secondary | ICD-10-CM

## 2011-06-06 LAB — BASIC METABOLIC PANEL
BUN: 37 mg/dL — ABNORMAL HIGH (ref 6–23)
CO2: 26 mEq/L (ref 19–32)
Calcium: 9 mg/dL (ref 8.4–10.5)
Chloride: 107 mEq/L (ref 96–112)
Creatinine, Ser: 1.8 mg/dL — ABNORMAL HIGH (ref 0.4–1.5)
GFR: 37.38 mL/min — ABNORMAL LOW (ref >60.00)
Glucose, Bld: 89 mg/dL (ref 70–99)
Potassium: 5.4 mEq/L — ABNORMAL HIGH (ref 3.5–5.1)
Sodium: 139 mEq/L (ref 135–145)

## 2011-06-08 ENCOUNTER — Encounter (HOSPITAL_COMMUNITY)
Admission: RE | Admit: 2011-06-08 | Discharge: 2011-06-08 | Disposition: A | Discharge status: Home / Self Care | Payer: Medicare Other | Source: Ambulatory Visit | Attending: Cardiology | Admitting: Cardiology

## 2011-06-11 ENCOUNTER — Encounter (HOSPITAL_COMMUNITY)
Admission: RE | Admit: 2011-06-11 | Discharge: 2011-06-11 | Disposition: A | Discharge status: Home / Self Care | Payer: Medicare Other | Source: Ambulatory Visit | Attending: Cardiology | Admitting: Cardiology

## 2011-06-11 DIAGNOSIS — I080 Rheumatic disorders of both mitral and aortic valves: Secondary | ICD-10-CM | POA: Insufficient documentation

## 2011-06-11 DIAGNOSIS — K219 Gastro-esophageal reflux disease without esophagitis: Secondary | ICD-10-CM | POA: Insufficient documentation

## 2011-06-11 DIAGNOSIS — Z5189 Encounter for other specified aftercare: Secondary | ICD-10-CM | POA: Insufficient documentation

## 2011-06-11 DIAGNOSIS — I10 Essential (primary) hypertension: Secondary | ICD-10-CM | POA: Insufficient documentation

## 2011-06-11 DIAGNOSIS — I5030 Unspecified diastolic (congestive) heart failure: Secondary | ICD-10-CM | POA: Insufficient documentation

## 2011-06-11 DIAGNOSIS — Z8679 Personal history of other diseases of the circulatory system: Secondary | ICD-10-CM | POA: Insufficient documentation

## 2011-06-11 DIAGNOSIS — I2510 Atherosclerotic heart disease of native coronary artery without angina pectoris: Secondary | ICD-10-CM | POA: Insufficient documentation

## 2011-06-11 DIAGNOSIS — I509 Heart failure, unspecified: Secondary | ICD-10-CM | POA: Insufficient documentation

## 2011-06-11 DIAGNOSIS — I4891 Unspecified atrial fibrillation: Secondary | ICD-10-CM | POA: Insufficient documentation

## 2011-06-11 DIAGNOSIS — I129 Hypertensive chronic kidney disease with stage 1 through stage 4 chronic kidney disease, or unspecified chronic kidney disease: Secondary | ICD-10-CM | POA: Insufficient documentation

## 2011-06-11 DIAGNOSIS — E039 Hypothyroidism, unspecified: Secondary | ICD-10-CM | POA: Insufficient documentation

## 2011-06-11 DIAGNOSIS — N189 Chronic kidney disease, unspecified: Secondary | ICD-10-CM | POA: Insufficient documentation

## 2011-06-13 ENCOUNTER — Encounter (HOSPITAL_COMMUNITY)
Admission: RE | Admit: 2011-06-13 | Discharge: 2011-06-13 | Disposition: A | Discharge status: Home / Self Care | Payer: Medicare Other | Source: Ambulatory Visit | Attending: Cardiology | Admitting: Cardiology

## 2011-06-13 NOTE — Progress Notes (Signed)
Pt developed skin tear while getting off stationary bike today at cardiac rehab.  Area cleaned with gauze, tegaderm applied.  No s/s of injury or infection.

## 2011-06-15 ENCOUNTER — Encounter (HOSPITAL_COMMUNITY)
Admission: RE | Admit: 2011-06-15 | Discharge: 2011-06-15 | Disposition: A | Discharge status: Home / Self Care | Payer: Medicare Other | Source: Ambulatory Visit | Attending: Cardiology | Admitting: Cardiology

## 2011-06-15 ENCOUNTER — Encounter (HOSPITAL_COMMUNITY): Payer: Self-pay

## 2011-06-18 NOTE — Progress Notes (Signed)
Cardiac Rehabilitation Program Outcomes Report   Orientation: 03/08/2011 Graduate Date:  06/15/2011 Discharge Date:   # of sessions completed: 36  Cardiologist: Daleen Squibb Family MD:  Rivka Safer Time:  0815  A.  Exercise Program:  Tolerates exercise @ 4.6 METS for 30 minutes, Improved functional capacity  16.33 %, Decreased  muscular strength  1.61 %, Improved  flexibility 20.0 %, Exercise limited by musculoskeletal problems and Discharged to home exercise program.  Anticipated compliance:  fair  B.  Mental Health:    C.  Education/Instruction/Skills  Accurately checks own pulse, Knows THR for exercise, Uses Perceived Exertion Scale and/or Dyspnea Scale and Attended 12 education classes    D.  Nutrition/Weight Control/Body Composition:  % Body Fat  28.6 and Patient has gained 5.2 kg Pt following a step 1 therapeutic lifestyle changes diet according to pt nutrition survey on admission. Pt consumed 21.7% kcal from saturated fat. Do not feel nutrition survey accurately reflects pt intake. Pt was not following a heart healthy diet on admission. No post-rehab data available to evaluate. Pt wt now up to pt's reported UBW.  Section completed by: Mickle Plumb, M.Ed, RD, LDN, CDE  E.  Blood Lipids  No results found for this basename: CHOL, HDL, LDLCALC, LDLDIRECT, TRIG, CHOLHDL    F.  Lifestyle Changes:    G.  Symptoms noted with exercise:  Shortness of breath, Musculoskeletal problems, Fatigue, Resting hypertension and Exertional hypertension  Report Completed By:  Electronically signed by Harriett Sine MS on Monday June 18 2011 at 0921   Comments:         Pt successfully completed cardiac rehab attending 36/36 exercise and 18 education sessions. Pt had excellent participation.   Pt hypertensive at times highest rest BP- 160/54 and highest exercise [id number], Telemetry-NSR.  Pt METS increased from 2.8 at baseline to 3.6 upon completion of program. Pt plans to  exercise on his own.  Pt did not have hospital admission during cardiac rehab period.  Pt has made positive lifestyle changes and should be commended for his efforts.  Pt was a pleasure to work with.  Thank you for the referral.

## 2011-06-21 ENCOUNTER — Ambulatory Visit (INDEPENDENT_AMBULATORY_CARE_PROVIDER_SITE_OTHER): Payer: Medicare Other | Admitting: *Deleted

## 2011-06-21 DIAGNOSIS — I10 Essential (primary) hypertension: Secondary | ICD-10-CM

## 2011-06-21 LAB — BASIC METABOLIC PANEL
Chloride: 106 mEq/L (ref 96–112)
Creatinine, Ser: 1.9 mg/dL — ABNORMAL HIGH (ref 0.4–1.5)
GFR: 35.58 mL/min — ABNORMAL LOW (ref >60.00)
Potassium: 4.4 mEq/L (ref 3.5–5.1)

## 2011-06-22 ENCOUNTER — Ambulatory Visit: Payer: Medicare Other | Admitting: Nurse Practitioner

## 2011-06-26 ENCOUNTER — Encounter: Payer: Self-pay | Admitting: Nurse Practitioner

## 2011-06-26 ENCOUNTER — Ambulatory Visit (INDEPENDENT_AMBULATORY_CARE_PROVIDER_SITE_OTHER): Payer: Medicare Other | Admitting: Nurse Practitioner

## 2011-06-26 VITALS — BP 156/58 | HR 44 | Ht 70.0 in | Wt 181.6 lb

## 2011-06-26 DIAGNOSIS — I10 Essential (primary) hypertension: Secondary | ICD-10-CM

## 2011-06-26 DIAGNOSIS — I4891 Unspecified atrial fibrillation: Secondary | ICD-10-CM

## 2011-06-26 DIAGNOSIS — I5032 Chronic diastolic (congestive) heart failure: Secondary | ICD-10-CM

## 2011-06-26 NOTE — Assessment & Plan Note (Signed)
He is in sinus on physical exam today. Remains on his amiodarone. Heart rates are staying in the 50's at home. He is not symptomatic at this time. He is not a candidate for coumadin and unfortunately, his stroke risk is high. We will see him back in about 3 months. No change in his current regimen for now. Patient is agreeable to this plan and will call if any problems develop in the interim.

## 2011-06-26 NOTE — Patient Instructions (Signed)
You may use some extra Lasix as needed but I would really encourage you to use your support stockings  We will see you in 3 months.  Call the [facility] office at [phone] if you have any questions, problems or concerns.

## 2011-06-26 NOTE — Assessment & Plan Note (Signed)
He has satisfactory readings for the most part. He is to let us know if he starts running above 160 consistently.

## 2011-06-26 NOTE — Assessment & Plan Note (Signed)
Weight is up a little. I told him he could use a little extra lasix as needed. He does have some swelling but I think if he could start wearing his support stockings it will help him. For now, he seems to be holding his own and he is happy with his current status. Will see him back in 3 months. Will need labs on return. Patient is agreeable to this plan and will call if any problems develop in the interim.

## 2011-06-26 NOTE — Progress Notes (Signed)
[name] Date of Birth: 29-Aug-1922 Medical Record #[id number]  History of Present Illness: [name] is seen back today for a one month check. [name] is seen for Dr. Daleen Walton. [name] is an active 78 year old male. [name] had minimally invasive AVR by Dr. Silvestre Walton at [facility] this past January. Does have PAF and is on amiodarone. No longer on Diltiazem due to bradycardia. Not a candidate for coumadin given his anemia, thrombocytopenia and history of falls. Other issues include HTN, DM, tremor, prior throat cancer, gout, GERD, hypothyroidism, CKD and DJD.   [name] comes in today. [name] is here with his wife. Seems to be holding his own for now. His wife is happy with how [name] is doing. Has been fishing at [employer]. Does have some swelling in his legs but has stopped using support stockings due to the heat. Some extra Lasix has been used as well. Not using too much salt. Not dizzy. Still has his tremor which is chronic. Skin remains quite fragile. Not very short of breath. No chest pain. Had BMET last week that looked ok. Has CKD. Brings in his blood pressure readings. Some lability noted but for the most part it is acceptable. [name] checks his blood pressure before taking his medicines. [name] has graduated from cardiac rehab.   Current Outpatient Prescriptions on File Prior to Visit  Medication Sig Dispense Refill  . allopurinol (ZYLOPRIM) 100 MG tablet Take 100 mg by mouth Daily.      [name] Kitchen amiodarone (PACERONE) 200 MG tablet Take 200 mg by mouth daily.      [name] Kitchen aspirin 325 MG tablet Take 325 mg by mouth daily.        . Cholecalciferol (VITAMIN D) 1000 UNITS capsule Take 1 capsule (1,000 Units total) by mouth daily.  90 capsule  3  . colchicine 0.6 MG tablet as needed. Take one tablet by mouth daily during an acute episode of gout.      . fish oil-omega-3 fatty acids 1000 MG capsule Take 1 g by mouth daily.        . Flaxseed, Linseed, (FLAX SEED OIL) 1000 MG CAPS Take 1 capsule by mouth daily.       . furosemide (LASIX) 20 MG tablet Take  2 tablets (40 mg total) by mouth daily.  180 tablet  3  . levothyroxine (LEVOTHROID) 25 MCG tablet Take 1 tablet (25 mcg total) by mouth daily.  30 tablet  11  . metoprolol succinate (TOPROL-XL) 25 MG 24 hr tablet Take 1 tablet (25 mg total) by mouth daily.  90 tablet  3  . Multiple Vitamin (MULTIVITAMIN) tablet Take 1 tablet by mouth daily.        . nitroGLYCERIN (NITROSTAT) 0.4 MG SL tablet Place 1 tablet (0.4 mg total) under the tongue every 5 (five) minutes as needed for chest pain.  25 tablet  6  . omeprazole (PRILOSEC) 20 MG capsule Take 20 mg by mouth daily.      . potassium chloride SA (K-DUR,KLOR-CON) 20 MEQ tablet Take 1.5 tablets (30 mEq total) by mouth daily. 1 1/2 by mouth daily  135 tablet  3  . propranolol (INDERAL) 10 MG tablet Take 10 mg by mouth 3 (three) times daily. Sometimes only taking just BID      . vitamin C (ASCORBIC ACID) 500 MG tablet Take 1 tablet (500 mg total) by mouth daily.  90 tablet  3  . DISCONTD: LORazepam (ATIVAN) 0.5 MG tablet 1 qhs  prn  30  tablet  2    Allergies  Allergen Reactions  . Penicillins     Does not remember reaction (~year 1950)  . Neomycin-Bacitracin Zn-Polymyx     ? Reaction (thinks [name] remembers redness)    Past Medical History  Diagnosis Date  . Stroke   . Atrial fibrillation     amiodarone therapy; not a candidate for anticoagulation  . Bradycardia   . Aortic stenosis     echo 10/12: EF 65-70%, grade 2 diast dysfxn, severe AS, mean gradient 60 mmHg, mod LAE, PASP 47;  s/p minimally invasive tissue AVR with Dr. Silvestre Walton at [facility] 01/2011 (pre-AVR cath with no obs CAD)  . Hypertension   . Diabetes mellitus   . Anemia   . Action tremor   . Thrombocytopenia     Dr. Truett Walton  . Throat cancer     s/p resection  . Colon polyp   . Anxiety   . Splenomegaly   . Gout   . GERD (gastroesophageal reflux disease)   . Hypothyroidism   . Leukopenia     Chronic pancytopenia  . Degenerative disc disease   . Joint effusion, knee     left  knee  . Synovial cyst of popliteal space   . Cellulitis of left leg 10/11-16/2012  . CKD (chronic kidney disease)     Past Surgical History  Procedure Date  . Cholecystectomy   . Joint effusion     left knee  . Aortic valve replacement 01/23/2011    via minimally invasive approach per Dr [name], [facility]    History  Smoking status  . Former Smoker  . Quit date: 01/11/1962  Smokeless tobacco  . Not on file    History  Alcohol Use No    Family History  Problem Relation Age of Onset  . Colon cancer    . Stroke    . Cancer Other     colon  . Stroke Other     Review of Systems: The review of systems is per the HPI.  All other systems were reviewed and are negative.  Physical Exam: BP 156/58  Pulse 44  Ht 5\' 10"  (1.778 m)  Wt 181 lb 9.6 oz (82.373 kg)  BMI 26.06 kg/m2 Patient is very pleasant and in no acute distress. Skin is warm and dry. [name] is quite ecchymotic. Arms are bluish in hue from his amiodarone. Color is normal.  HEENT is unremarkable. Normocephalic/atraumatic. PERRL. Sclera are nonicteric. Neck is supple. No masses. No JVD. Lungs are clear. Cardiac exam shows a regular rate and rhythm. Rate is about 50 by me. Abdomen is soft. Extremities are with 1+ bilateral edema. Gait and ROM are intact. No gross neurologic deficits noted.   LABORATORY DATA:    Chemistry      Component Value Date/Time   NA 139 06/21/2011 0954   K 4.4 06/21/2011 0954   CL 106 06/21/2011 0954   CO2 25 06/21/2011 0954   BUN 36* 06/21/2011 0954   CREATININE 1.9* 06/21/2011 0954      Component Value Date/Time   CALCIUM 8.8 06/21/2011 0954   ALKPHOS 65 01/12/2011 1703   AST 32 01/12/2011 1703   ALT 19 01/12/2011 1703   BILITOT 0.7 01/12/2011 1703        Assessment / Plan:

## 2011-07-02 ENCOUNTER — Other Ambulatory Visit: Payer: Self-pay | Admitting: Cardiology

## 2011-07-02 MED ORDER — OMEPRAZOLE 20 MG PO CPDR: 20.0000 mg | DELAYED_RELEASE_CAPSULE | Freq: Every day | ORAL | Status: DC

## 2011-07-02 NOTE — Telephone Encounter (Signed)
Refill- Omeprazole (PRILOSEC) 20 MG capsule  (90 Day Supply Please)   Preferred Pharmacy [facility]

## 2011-07-18 ENCOUNTER — Other Ambulatory Visit (INDEPENDENT_AMBULATORY_CARE_PROVIDER_SITE_OTHER): Payer: Medicare Other

## 2011-07-18 DIAGNOSIS — E0590 Thyrotoxicosis, unspecified without thyrotoxic crisis or storm: Secondary | ICD-10-CM

## 2011-07-19 LAB — TSH: TSH: 1 u[IU]/mL (ref 0.35–5.50)

## 2011-08-31 ENCOUNTER — Ambulatory Visit (INDEPENDENT_AMBULATORY_CARE_PROVIDER_SITE_OTHER): Payer: Medicare Other | Admitting: Nurse Practitioner

## 2011-08-31 ENCOUNTER — Telehealth: Payer: Self-pay | Admitting: *Deleted

## 2011-08-31 ENCOUNTER — Encounter (INDEPENDENT_AMBULATORY_CARE_PROVIDER_SITE_OTHER): Payer: Medicare Other

## 2011-08-31 ENCOUNTER — Telehealth: Payer: Self-pay | Admitting: Cardiology

## 2011-08-31 ENCOUNTER — Encounter: Payer: Self-pay | Admitting: Nurse Practitioner

## 2011-08-31 VITALS — BP 170/68 | HR 65 | Ht 70.0 in | Wt 185.8 lb

## 2011-08-31 DIAGNOSIS — I4891 Unspecified atrial fibrillation: Secondary | ICD-10-CM

## 2011-08-31 DIAGNOSIS — I5030 Unspecified diastolic (congestive) heart failure: Secondary | ICD-10-CM

## 2011-08-31 DIAGNOSIS — I10 Essential (primary) hypertension: Secondary | ICD-10-CM

## 2011-08-31 DIAGNOSIS — IMO0002 Reserved for concepts with insufficient information to code with codable children: Secondary | ICD-10-CM

## 2011-08-31 LAB — CBC WITH DIFFERENTIAL/PLATELET
Basophils Absolute: 0 10*3/uL (ref 0.0–0.1)
Basophils Relative: 0.3 % (ref 0.0–3.0)
Eosinophils Absolute: 0 10*3/uL (ref 0.0–0.7)
Eosinophils Relative: 1.3 % (ref 0.0–5.0)
HCT: 35.5 % — ABNORMAL LOW (ref 39.0–52.0)
Hemoglobin: 11.8 g/dL — ABNORMAL LOW (ref 13.0–17.0)
Lymphocytes Relative: 20.1 % (ref 12.0–46.0)
Lymphs Abs: 0.7 10*3/uL (ref 0.7–4.0)
MCHC: 33.3 g/dL (ref 30.0–36.0)
MCV: 88.4 fl (ref 78.0–100.0)
Monocytes Absolute: 0.2 10*3/uL (ref 0.1–1.0)
Monocytes Relative: 6.8 % (ref 3.0–12.0)
Neutro Abs: 2.5 10*3/uL (ref 1.4–7.7)
Neutrophils Relative %: 71.5 % (ref 43.0–77.0)
Platelets: 68 10*3/uL — ABNORMAL LOW (ref 150.0–400.0)
RBC: 4.02 Mil/uL — ABNORMAL LOW (ref 4.22–5.81)
RDW: 17 % — ABNORMAL HIGH (ref 11.5–14.6)
WBC: 3.5 10*3/uL — ABNORMAL LOW (ref 4.5–10.5)

## 2011-08-31 LAB — BASIC METABOLIC PANEL
BUN: 39 mg/dL — ABNORMAL HIGH (ref 6–23)
CO2: 26 mEq/L (ref 19–32)
Calcium: 9.2 mg/dL (ref 8.4–10.5)
Chloride: 102 mEq/L (ref 96–112)
Creatinine, Ser: 1.9 mg/dL — ABNORMAL HIGH (ref 0.4–1.5)
GFR: 36.9 mL/min — ABNORMAL LOW (ref >60.00)
Glucose, Bld: 79 mg/dL (ref 70–99)
Potassium: 4.7 mEq/L (ref 3.5–5.1)
Sodium: 136 mEq/L (ref 135–145)

## 2011-08-31 MED ORDER — AMIODARONE HCL 200 MG PO TABS: 200.0000 mg | ORAL_TABLET | Freq: Two times a day (BID) | ORAL | Status: DC

## 2011-08-31 MED ORDER — PROPRANOLOL HCL 10 MG PO TABS: 10.0000 mg | ORAL_TABLET | Freq: Three times a day (TID) | ORAL | Status: DC

## 2011-08-31 MED ORDER — HYDRALAZINE HCL 25 MG PO TABS: 25.0000 mg | ORAL_TABLET | Freq: Two times a day (BID) | ORAL | Status: DC

## 2011-08-31 NOTE — Telephone Encounter (Signed)
New msg Pt called and said he woke up and is out of rhythm. Please call

## 2011-08-31 NOTE — Telephone Encounter (Signed)
Pt woke up this morning around 0230 "feeling my heart beat in the tips of my finger. I felt like my heart was skipping" Pt thinks he may be back in atrial fib. Reviewed meds with pt.  Bp 117/60 HR 70-84. States he has been feeling weak just since this morning. Denies shortness of breath States edema is better and weight is down 2.2 pounds overnight. Agrees to come in for EKG this morning Mylo Red RN

## 2011-08-31 NOTE — Progress Notes (Signed)
[name] Date of Birth: Jul 30, 1922 Medical Record #[id number]  History of Present Illness: Mr. Kestenbaum is seen today for a work in visit. He is seen for Dr. Daleen Squibb. He is 78 years old. Has had minimally invasive AVR by Dr. Silvestre Mesi at [facility] in January of this year. Does have PAF and is on chronic amiodarone. Not able to tolerate Diltiazem due to bradycardia. Not a candidate for coumadin due to his anemia, thrombocytopenia and history of falls. Other issues include HTN, DM, tremor, prior throat cancer, gout, GERD, hypothyroidism, CKD and DJD.   He comes in today. He is here with his wife. He called earlier today. Thought he was back in atrial fib. Came to the office for a nurse visit and EKG. This does confirm atrial fib. He says he woke up out of rhythm during the night. Feels a little more short winded. Little dizzy. Has had balance issues but no recent falls. Continues to bruise. Last platelet count was just 50k. No chest pain. Blood pressure diary at home shows his readings to be creeping up. Little more swelling as well.   Current Outpatient Prescriptions on File Prior to Visit  Medication Sig Dispense Refill  . allopurinol (ZYLOPRIM) 100 MG tablet Take 100 mg by mouth Daily.      [name] Kitchen amiodarone (PACERONE) 200 MG tablet Take 200 mg by mouth daily.      [name] Kitchen aspirin 325 MG tablet Take 325 mg by mouth daily.        . Cholecalciferol (VITAMIN D) 1000 UNITS capsule Take 1 capsule (1,000 Units total) by mouth daily.  90 capsule  3  . colchicine 0.6 MG tablet as needed. Take one tablet by mouth daily during an acute episode of gout.      . fish oil-omega-3 fatty acids 1000 MG capsule Take 1 g by mouth daily.        . Flaxseed, Linseed, (FLAX SEED OIL) 1000 MG CAPS Take 1 capsule by mouth daily.       . furosemide (LASIX) 20 MG tablet Take 2 tablets (40 mg total) by mouth daily.  180 tablet  3  . levothyroxine (LEVOTHROID) 25 MCG tablet Take 1 tablet (25 mcg total) by mouth daily.  30 tablet  11  .  LORazepam (ATIVAN) 0.5 MG tablet Take 0.5 mg by mouth every 6 (six) hours as needed. 1 qhs  prn      . metoprolol succinate (TOPROL-XL) 25 MG 24 hr tablet Take 1 tablet (25 mg total) by mouth daily.  90 tablet  3  . Multiple Vitamin (MULTIVITAMIN) tablet Take 1 tablet by mouth daily.        . nitroGLYCERIN (NITROSTAT) 0.4 MG SL tablet Place 1 tablet (0.4 mg total) under the tongue every 5 (five) minutes as needed for chest pain.  25 tablet  6  . omeprazole (PRILOSEC) 20 MG capsule Take 1 capsule (20 mg total) by mouth daily.  90 capsule  1  . potassium chloride SA (K-DUR,KLOR-CON) 20 MEQ tablet Take 1.5 tablets (30 mEq total) by mouth daily. 1 1/2 by mouth daily  135 tablet  3  . propranolol (INDERAL) 10 MG tablet Take 10 mg by mouth 3 (three) times daily. Sometimes only taking just BID      . vitamin C (ASCORBIC ACID) 500 MG tablet Take 1 tablet (500 mg total) by mouth daily.  90 tablet  3    Allergies  Allergen Reactions  . Penicillins     Does  not remember reaction (~year 1950)  . Neomycin-Bacitracin Zn-Polymyx     ? Reaction (thinks he remembers redness)    Past Medical History  Diagnosis Date  . Stroke   . Atrial fibrillation     amiodarone therapy; not a candidate for anticoagulation  . Bradycardia   . Aortic stenosis     echo 10/12: EF 65-70%, grade 2 diast dysfxn, severe AS, mean gradient 60 mmHg, mod LAE, PASP 47;  s/p minimally invasive tissue AVR with Dr. Silvestre Mesi at [facility] 01/2011 (pre-AVR cath with no obs CAD)  . Hypertension   . Diabetes mellitus   . Anemia   . Action tremor   . Thrombocytopenia     Dr. [name]  . Throat cancer     s/p resection  . Colon polyp   . Anxiety   . Splenomegaly   . Gout   . GERD (gastroesophageal reflux disease)   . Hypothyroidism   . Leukopenia     Chronic pancytopenia  . Degenerative disc disease   . Joint effusion, knee     left knee  . Synovial cyst of popliteal space   . Cellulitis of left leg 10/11-16/2012  . CKD (chronic  kidney disease)     Past Surgical History  Procedure Date  . Cholecystectomy   . Joint effusion     left knee  . Aortic valve replacement 01/23/2011    via minimally invasive approach per Dr Silvestre Mesi, [facility]    History  Smoking status  . Former Smoker  . Quit date: 01/11/1962  Smokeless tobacco  . Not on file    History  Alcohol Use No    Family History  Problem Relation Age of Onset  . Colon cancer    . Stroke    . Cancer Other     colon  . Stroke Other     Review of Systems: The review of systems is per the HPI.  All other systems were reviewed and are negative.  Physical Exam: BP 170/68  Pulse 65  Ht 5\' 10"  (1.778 m)  Wt 185 lb 12.8 oz (84.278 kg)  BMI 26.66 kg/m2 Patient is an elderly white male who is very pleasant and in no acute distress. Skin is warm and dry. Color is normal.  HEENT is unremarkable. Normocephalic/atraumatic. PERRL. Sclera are nonicteric. Neck is supple. No masses. No JVD. Lungs are clear. Cardiac exam shows an irregular rhythm. His rate is controlled. He has an outflow murmur. Abdomen is soft. Extremities are with 1+ edema. His extremities are quite ecchymotic. Gait and ROM are intact. No gross neurologic deficits noted.   LABORATORY DATA: EKG today shows atrial fib with a controlled ventricular response.   Lab Results  Component Value Date   WBC 3.1* 05/29/2011   HGB 11.6* 05/29/2011   HCT 34.7* 05/29/2011   PLT 52* 05/29/2011   GLUCOSE 110* 06/21/2011   ALT 19 01/12/2011   AST 32 01/12/2011   NA 139 06/21/2011   K 4.4 06/21/2011   CL 106 06/21/2011   CREATININE 1.9* 06/21/2011   BUN 36* 06/21/2011   CO2 25 06/21/2011   TSH 1.00 07/18/2011   INR 1.27 10/20/2010   HGBA1C 6.0 10/18/2010    Assessment / Plan: 1. Recurrent atrial fib - He is on amiodarone. I have increased his dose to BID until I see him back in early September. He is not a candidate for coumadin.   2. Diastolic heart failure - Little more swelling on exam today. Weight is  up  about 4 pounds since his visit with me in June. I have increased his Lasix slightly for the next 3 days.  We will check labs today as well.   3. HTN - readings at home are quite high as it is here today. I have added Hydralazine 25 mg just BID. He will continue to monitor and bring his readings for review at his next visit in early September.  His Propranolol (for tremor) is refilled today.   I will see him back as scheduled in early September. Patient is agreeable to this plan and will call if any problems develop in the interim.

## 2011-08-31 NOTE — Telephone Encounter (Signed)
Message copied by Awilda Bill on Fri Aug 31, 2011  2:14 PM ------      Message from: Rosalio Macadamia      Created: Fri Aug 31, 2011  1:29 PM       Ok to report. Labs are satisfactory and are at his baseline.

## 2011-08-31 NOTE — Patient Instructions (Addendum)
I want you to take the amiodarone two times a day until I see you back  I want you to take an extra lasix (total of 60 mg) for the next 3 days only and then go back to your 2 pills (40mg ) a day  We need to check some labs today  Start Hydralazine 25 mg two times a day for your blood pressure. This prescription is at [facility]  I refilled your Propranolol to [facility]  Keep checking your blood pressure at home and bring in your readings from home.  I will see you in September as planned.  Call the [facility] office at [phone] if you have any questions, problems or concerns.

## 2011-08-31 NOTE — Telephone Encounter (Signed)
Pt spouse notifed of lab results.  Vista Mink, CMA

## 2011-09-03 ENCOUNTER — Telehealth: Payer: Self-pay | Admitting: Oncology

## 2011-09-03 ENCOUNTER — Ambulatory Visit (HOSPITAL_BASED_OUTPATIENT_CLINIC_OR_DEPARTMENT_OTHER): Payer: Medicare Other | Admitting: Oncology

## 2011-09-03 ENCOUNTER — Other Ambulatory Visit (HOSPITAL_BASED_OUTPATIENT_CLINIC_OR_DEPARTMENT_OTHER): Payer: Medicare Other

## 2011-09-03 VITALS — BP 167/59 | HR 52 | Temp 97.0°F | Resp 20 | Ht 70.0 in | Wt 186.4 lb

## 2011-09-03 DIAGNOSIS — I4891 Unspecified atrial fibrillation: Secondary | ICD-10-CM

## 2011-09-03 DIAGNOSIS — D696 Thrombocytopenia, unspecified: Secondary | ICD-10-CM

## 2011-09-03 DIAGNOSIS — D649 Anemia, unspecified: Secondary | ICD-10-CM

## 2011-09-03 DIAGNOSIS — D61818 Other pancytopenia: Secondary | ICD-10-CM

## 2011-09-03 DIAGNOSIS — Q2529 Other atresia of aorta: Secondary | ICD-10-CM

## 2011-09-03 LAB — CBC WITH DIFFERENTIAL/PLATELET
Basophils Absolute: 0 10*3/uL (ref 0.0–0.1)
Eosinophils Absolute: 0.1 10*3/uL (ref 0.0–0.5)
HCT: 37.4 % — ABNORMAL LOW (ref 38.4–49.9)
HGB: 12.6 g/dL — ABNORMAL LOW (ref 13.0–17.1)
LYMPH%: 16.3 % (ref 14.0–49.0)
MCV: 87.9 fL (ref 79.3–98.0)
MONO%: 5.5 % (ref 0.0–14.0)
NEUT#: 3.1 10*3/uL (ref 1.5–6.5)
NEUT%: 76.1 % — ABNORMAL HIGH (ref 39.0–75.0)
Platelets: 66 10*3/uL — ABNORMAL LOW (ref 140–400)
RDW: 17 % — ABNORMAL HIGH (ref 11.0–14.6)

## 2011-09-03 NOTE — Telephone Encounter (Signed)
Gave pt appt calendar for February 2014 lab and MD °

## 2011-09-03 NOTE — Patient Instructions (Addendum)
[name] 1922-06-21 [id number]  [facility] Discharge Instructions  Your exam findings, labs and results were discussed with your MD today.  Filed Vitals:   09/03/11 0935  BP: 167/59  Pulse: 52  Temp: 97 F (36.1 C)  Resp: 20    Current Outpatient Prescriptions on File Prior to Visit  Medication Sig Dispense Refill  . allopurinol (ZYLOPRIM) 100 MG tablet Take 100 mg by mouth Daily.      [name] Kitchen amiodarone (PACERONE) 200 MG tablet Take 1 tablet (200 mg total) by mouth 2 (two) times daily.      [name] Kitchen aspirin 325 MG tablet Take 325 mg by mouth daily.        . Cholecalciferol (VITAMIN D) 1000 UNITS capsule Take 1 capsule (1,000 Units total) by mouth daily.  90 capsule  3  . colchicine 0.6 MG tablet as needed. Take one tablet by mouth daily during an acute episode of gout.      . fish oil-omega-3 fatty acids 1000 MG capsule Take 1 g by mouth daily.        . Flaxseed, Linseed, (FLAX SEED OIL) 1000 MG CAPS Take 1 capsule by mouth daily.       . furosemide (LASIX) 20 MG tablet Take 2 tablets (40 mg total) by mouth daily.  180 tablet  3  . hydrALAZINE (APRESOLINE) 25 MG tablet Take 1 tablet (25 mg total) by mouth 2 (two) times daily.  60 tablet  3  . levothyroxine (LEVOTHROID) 25 MCG tablet Take 1 tablet (25 mcg total) by mouth daily.  30 tablet  11  . LORazepam (ATIVAN) 0.5 MG tablet Take 0.5 mg by mouth every 6 (six) hours as needed. 1 qhs  prn      . metoprolol succinate (TOPROL-XL) 25 MG 24 hr tablet Take 1 tablet (25 mg total) by mouth daily.  90 tablet  3  . Multiple Vitamin (MULTIVITAMIN) tablet Take 1 tablet by mouth daily.        . nitroGLYCERIN (NITROSTAT) 0.4 MG SL tablet Place 1 tablet (0.4 mg total) under the tongue every 5 (five) minutes as needed for chest pain.  25 tablet  6  . omeprazole (PRILOSEC) 20 MG capsule Take 1 capsule (20 mg total) by mouth daily.  90 capsule  1  . potassium chloride SA (K-DUR,KLOR-CON) 20 MEQ tablet Take 1.5 tablets (30 mEq total) by mouth  daily. 1 1/2 by mouth daily  135 tablet  3  . propranolol (INDERAL) 10 MG tablet Take 1 tablet (10 mg total) by mouth 3 (three) times daily. Sometimes only taking just BID  270 tablet  3  . vitamin C (ASCORBIC ACID) 500 MG tablet Take 1 tablet (500 mg total) by mouth daily.  90 tablet  3    Please visit scheduling to obtain calendar for future appointments.  Please call the [facility] at [phone] during business hours should you have any further questions or need assistance in obtaining follow-up care. If you have a medical emergency, please dial [phone].  Special Instructions:

## 2011-09-03 NOTE — Progress Notes (Signed)
   [facility]    OFFICE PROGRESS NOTE   INTERVAL HISTORY:   He returns as scheduled. He continues to bruise easily and bleeds with minor trauma to the skin. No other bleeding. Good appetite and energy level. The dyspnea improved following aortic valve replacement. He had recurrent atrial fibrillation last week and the amiodarone dose was increased.  Objective:  Vital signs in last 24 hours:  Blood pressure 167/59, pulse 52, temperature 97 F (36.1 C), temperature source Oral, resp. rate 20, height 5\' 10"  (1.778 m), weight 186 lb 6.4 oz (84.55 kg).    Lymphatics: No cervical, supraclavicular, or axillary nodes Resp: Lungs clear bilaterally Cardio: Regular rate and rhythm with an occasional premature beat GI: No hepatosplenomegaly Vascular: No leg edema  Skin: Changes of chronic purpura at the arms and legs     Lab Results:  Lab Results  Component Value Date   WBC 4.0 09/03/2011   HGB 12.6* 09/03/2011   HCT 37.4* 09/03/2011   MCV 87.9 09/03/2011   PLT 66* 09/03/2011   ANC 3.1   Medications: I have reviewed the patient's current medications.  Assessment/Plan: 1. Chronic pancytopenia-the thrombocytopenia responded to Nplate therapy prior to aortic valve replacement surgery 2. History of Splenomegaly.  3. History of atrial fibrillation.  4. Admission with a left lower extremity cellulitis in June 2009.  5. "Hot flashes," etiology unclear.  6. Diabetes.  7. Severe aortic stenosis-he underwent aortic valve replacement surgery at [facility] on 01/23/2011 8. 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  9. Episode of gout at the right great toe following the  aortic valve replacement surgery    Disposition:  He is stable from a hematologic standpoint. He remains off of Nplate. Mr. Sahni will return for an office visit and CBC in 6 months.   Thornton Papas, MD  09/03/2011  6:55 PM

## 2011-09-11 ENCOUNTER — Telehealth: Payer: Self-pay | Admitting: Cardiology

## 2011-09-11 NOTE — Telephone Encounter (Signed)
Patient called phone rings busy. 

## 2011-09-11 NOTE — Telephone Encounter (Signed)
Advised pt per dr wall's advice--BP will go down when pain from fall is under control--but if this does not happen and symptoms get worse--go to nearest ED--pt agrees

## 2011-09-11 NOTE — Telephone Encounter (Signed)
plz return call to patient at [phone] regarding swelling in the hand

## 2011-09-11 NOTE — Telephone Encounter (Signed)
That is fine. Blood pressure will come down once his pain improves. Do not over treat.

## 2011-09-11 NOTE — Telephone Encounter (Signed)
[place] and [place] B/P is still elevated.B/P ranging 173/58,164/49,162/52,156/52 pulse-in rhythm ranging in the 50's.States his hands are still swollen with lf worse,seems slightly better this morning.States he lost balance in boat this past Saturday and fell backwards and thinks he fractured a rib on lf side, with pain in lf rib area.States he applied ice and is taking aleve.Advised to see pcp if not better.Patient was told Dr.Wall not in office today.Will send message to him for advice about B/P and swelling in hands.

## 2011-09-18 ENCOUNTER — Ambulatory Visit (INDEPENDENT_AMBULATORY_CARE_PROVIDER_SITE_OTHER): Payer: Medicare Other | Admitting: Internal Medicine

## 2011-09-18 ENCOUNTER — Ambulatory Visit (INDEPENDENT_AMBULATORY_CARE_PROVIDER_SITE_OTHER): Payer: Medicare Other | Admitting: Nurse Practitioner

## 2011-09-18 ENCOUNTER — Encounter: Payer: Self-pay | Admitting: Nurse Practitioner

## 2011-09-18 ENCOUNTER — Encounter: Payer: Self-pay | Admitting: Internal Medicine

## 2011-09-18 VITALS — BP 128/60 | HR 52 | Temp 98.3°F | Wt 192.4 lb

## 2011-09-18 VITALS — BP 126/54 | HR 52 | Ht 70.0 in | Wt 192.4 lb

## 2011-09-18 DIAGNOSIS — I4891 Unspecified atrial fibrillation: Secondary | ICD-10-CM

## 2011-09-18 DIAGNOSIS — W1809XA Striking against other object with subsequent fall, initial encounter: Secondary | ICD-10-CM

## 2011-09-18 DIAGNOSIS — S298XXA Other specified injuries of thorax, initial encounter: Secondary | ICD-10-CM

## 2011-09-18 DIAGNOSIS — Z23 Encounter for immunization: Secondary | ICD-10-CM

## 2011-09-18 DIAGNOSIS — I5032 Chronic diastolic (congestive) heart failure: Secondary | ICD-10-CM

## 2011-09-18 DIAGNOSIS — I480 Paroxysmal atrial fibrillation: Secondary | ICD-10-CM

## 2011-09-18 DIAGNOSIS — N289 Disorder of kidney and ureter, unspecified: Secondary | ICD-10-CM

## 2011-09-18 DIAGNOSIS — W1800XA Striking against unspecified object with subsequent fall, initial encounter: Secondary | ICD-10-CM

## 2011-09-18 DIAGNOSIS — I10 Essential (primary) hypertension: Secondary | ICD-10-CM

## 2011-09-18 DIAGNOSIS — S299XXA Unspecified injury of thorax, initial encounter: Secondary | ICD-10-CM

## 2011-09-18 DIAGNOSIS — D696 Thrombocytopenia, unspecified: Secondary | ICD-10-CM

## 2011-09-18 DIAGNOSIS — R7309 Other abnormal glucose: Secondary | ICD-10-CM

## 2011-09-18 MED ORDER — HYDRALAZINE HCL 25 MG PO TABS: 25.0000 mg | ORAL_TABLET | Freq: Two times a day (BID) | ORAL | Status: DC

## 2011-09-18 MED ORDER — TRAMADOL HCL 50 MG PO TABS: 50.0000 mg | ORAL_TABLET | Freq: Three times a day (TID) | ORAL | Status: AC | PRN

## 2011-09-18 MED ORDER — ALLOPURINOL 100 MG PO TABS: 100.0000 mg | ORAL_TABLET | Freq: Every day | ORAL | Status: DC

## 2011-09-18 MED ORDER — AMIODARONE HCL 200 MG PO TABS: 200.0000 mg | ORAL_TABLET | Freq: Every day | ORAL | Status: DC

## 2011-09-18 NOTE — Assessment & Plan Note (Signed)
Renal impairment is stable with a creatinine of 1.9 and GFR of 36.90.

## 2011-09-18 NOTE — Progress Notes (Signed)
  Subjective:    Patient ID: [name], male    DOB: 12-13-1922, 78 y.o.   MRN: [id number]  [place]PI [place] and struck his left posterior thorax against the edge of the boat. Since that time he describes constant burning pain up to a level 8, only partially responsive to Aleve. The pain is aggravated by change in position but not by respirations. He denies any associated shortness of breath or hemoptysis.  Hematologist is monitoring his anemia and thrombocytopenia. His most recent platelet count was 66,000 on 09/03/11.    Review of Systems The home health nurse recommended updating labs and immunizations. Discussion of fall risk prevention was also recommended.  His ophthalmologic exam has been completed this year. Apparently he has increased pressure in one eye but no diagnosis of glaucoma. He has diabetic shoes from the [place].  He denies hematuria or rectal bleeding since the fall.     Objective:   Physical Exam General appearance;good nourishment; uncomfortable but  w/o distress.  Eyes: No conjunctival inflammation or scleral icterus is present.  Oral exam: Dental hygiene is good; lips and gums are healthy appearing.There is no oropharyngeal erythema or exudate noted.   Heart:  Normal rate and regular rhythm. S1 and S2 normal without gallop,  click, rub or other extra sounds.Grade 1/6 systolic murmur    Lungs/thorax: There is no increased work of breathing; he has slight rales in the left lower lobe. There is tenderness to palpation over the left posterior axillary line. Slight crepitus is suggested clinically.   Skin:Warm & dry.  Intact without suspicious lesions or rashes ; no jaundice or tenting. He has marked hyperpigmentation over the forearms; there is significant bruising of the left posterior thorax  Lymphatic: No lymphadenopathy is noted about the head, neck, axilla areas.              Assessment & Plan:  #1 chest wall trauma, rule  out fracture. Fall prevention discussed Plan: See orders and recommendations

## 2011-09-18 NOTE — Addendum Note (Signed)
Addended by: Edison Simon C on: 09/18/2011 02:00 PM   Modules accepted: Orders

## 2011-09-18 NOTE — Patient Instructions (Addendum)
Please  schedule fasting Labs  @ [facility]: Lipids, hepatic panel .PLEASE BRING THESE INSTRUCTIONS TO FOLLOW UP  LAB APPOINTMENT.This will guarantee correct labs are drawn, eliminating need for repeat blood sampling ( needle sticks ! ). Diagnoses /Codes: 401.9,272.4,790.29. Annual podiatry and retinal assessments are indicated because of  the diabetes.   If you activate My Chart; the results can be released to you as soon as they populate from the lab. If you choose not to use this program; the labs have to be reviewed, copied & mailed   causing a delay in getting the results to you.

## 2011-09-18 NOTE — Patient Instructions (Addendum)
See Dr. Alwyn Ren later today  I think you are doing ok  Stay on your current medicines but you may take an extra Lasix for the next 2 days.   No more Alleve. I think this is what made your weight go up  I want to see you in 3 months.  Call the [facility] office at [phone] if you have any questions, problems or concerns.

## 2011-09-18 NOTE — Assessment & Plan Note (Signed)
A1c will be scheduled

## 2011-09-18 NOTE — Progress Notes (Signed)
[name] Date of Birth: 1922/08/20 Medical Record #[id number]  History of Present Illness: [name] is seen back today for a follow up visit. He is seen for Dr. Daleen Squibb. I saw him about 2 weeks ago with recurrent atrial fib. He is 89. He has had minimally invasive AVR by Dr. Silvestre Mesi at [facility] in January of this year. Does have PAF and is on chronic amiodarone. Not able to tolerate Diltiazem due to bradycardia. Not a candidate for coumadin due to his anemia, thrombocytopenia and history of falls. Other issues include HTN, DM, tremor, prior throat cancer, gout, GERD, hypothyroidism, CKD and DJD.   I saw him 2 weeks ago and he was back in atrial fib. Felt a little more short of breath and dizzy. We do not have a lot of options for his atrial fib. We increased his amiodarone. He converted back within just a couple of days. He cut the amiodarone back to one a day at that time. Over the recent holiday, he was at the beach and fell in the boat. Thought he may have fractured a rib fracture. He is pretty bruised up. He has had considerable pain and called in to report that his BP was up. This was felt to be due to the pain and probably the use of NSAID. His wife says he was taking Aleve up to 4 x a day.   He comes in today. He is here with his wife. He is doing ok. Still with some pain in his left side. Hurts a little with deep breathing. Seeing Dr. Alwyn Ren later today. His rhythm has been ok since the most recent episode of atrial fib. His weight is up. It coincides with the use of Aleve. No chest pain. Not dizzy or lightheaded.   Current Outpatient Prescriptions on File Prior to Visit  Medication Sig Dispense Refill  . aspirin 325 MG tablet Take 325 mg by mouth daily.        . Cholecalciferol (VITAMIN D) 1000 UNITS capsule Take 1 capsule (1,000 Units total) by mouth daily.  90 capsule  3  . colchicine 0.6 MG tablet as needed. Take one tablet by mouth daily during an acute episode of gout.      . fish  oil-omega-3 fatty acids 1000 MG capsule Take 1 g by mouth daily.        . Flaxseed, Linseed, (FLAX SEED OIL) 1000 MG CAPS Take 1 capsule by mouth daily.       . furosemide (LASIX) 20 MG tablet Take 2 tablets (40 mg total) by mouth daily.  180 tablet  3  . levothyroxine (LEVOTHROID) 25 MCG tablet Take 1 tablet (25 mcg total) by mouth daily.  30 tablet  11  . LORazepam (ATIVAN) 0.5 MG tablet Take 0.5 mg by mouth every 6 (six) hours as needed. 1 qhs  prn      . metoprolol succinate (TOPROL-XL) 25 MG 24 hr tablet Take 1 tablet (25 mg total) by mouth daily.  90 tablet  3  . Multiple Vitamin (MULTIVITAMIN) tablet Take 1 tablet by mouth daily.        . nitroGLYCERIN (NITROSTAT) 0.4 MG SL tablet Place 1 tablet (0.4 mg total) under the tongue every 5 (five) minutes as needed for chest pain.  25 tablet  6  . omeprazole (PRILOSEC) 20 MG capsule Take 1 capsule (20 mg total) by mouth daily.  90 capsule  1  . potassium chloride SA (K-DUR,KLOR-CON) 20 MEQ tablet Take 1.5  tablets (30 mEq total) by mouth daily. 1 1/2 by mouth daily  135 tablet  3  . propranolol (INDERAL) 10 MG tablet Take 1 tablet (10 mg total) by mouth 3 (three) times daily. Sometimes only taking just BID  270 tablet  3  . vitamin C (ASCORBIC ACID) 500 MG tablet Take 1 tablet (500 mg total) by mouth daily.  90 tablet  3  . DISCONTD: allopurinol (ZYLOPRIM) 100 MG tablet Take 100 mg by mouth Daily.      [name] Kitchen DISCONTD: amiodarone (PACERONE) 200 MG tablet Take 1 tablet (200 mg total) by mouth 2 (two) times daily.      [name] Kitchen DISCONTD: hydrALAZINE (APRESOLINE) 25 MG tablet Take 1 tablet (25 mg total) by mouth 2 (two) times daily.  60 tablet  3    Allergies  Allergen Reactions  . Penicillins     Does not remember reaction (~year 1950)  . Neomycin-Bacitracin Zn-Polymyx     ? Reaction (thinks he remembers redness)    Past Medical History  Diagnosis Date  . Stroke   . Atrial fibrillation     amiodarone therapy; not a candidate for anticoagulation  .  Bradycardia   . Aortic stenosis     echo 10/12: EF 65-70%, grade 2 diast dysfxn, severe AS, mean gradient 60 mmHg, mod LAE, PASP 47;  s/p minimally invasive tissue AVR with Dr. Silvestre Mesi at [facility] 01/2011 (pre-AVR cath with no obs CAD)  . Hypertension   . Diabetes mellitus   . Anemia   . Action tremor   . Thrombocytopenia     Dr. [name]  . Throat cancer     s/p resection  . Colon polyp   . Anxiety   . Splenomegaly   . Gout   . GERD (gastroesophageal reflux disease)   . Hypothyroidism   . Leukopenia     Chronic pancytopenia  . Degenerative disc disease   . Joint effusion, knee     left knee  . Synovial cyst of popliteal space   . Cellulitis of left leg 10/11-16/2012  . CKD (chronic kidney disease)     Past Surgical History  Procedure Date  . Cholecystectomy   . Joint effusion     left knee  . Aortic valve replacement 01/23/2011    via minimally invasive approach per Dr Silvestre Mesi, [facility]    History  Smoking status  . Former Smoker  . Quit date: 01/11/1962  Smokeless tobacco  . Not on file    History  Alcohol Use No    Family History  Problem Relation Age of Onset  . Colon cancer    . Stroke    . Cancer Other     colon  . Stroke Other     Review of Systems: The review of systems is per the HPI.  All other systems were reviewed and are negative.  Physical Exam: BP 126/54  Pulse 52  Ht 5\' 10"  (1.778 m)  Wt 192 lb 6.4 oz (87.272 kg)  BMI 27.61 kg/m2 Patient is very pleasant and in no acute distress. He does appear to be chronically ill. Skin is warm and dry. Color is normal.  He is quite ecchymotic. HEENT is unremarkable. Normocephalic/atraumatic. PERRL. Sclera are nonicteric. Neck is supple. No masses. No JVD. Lungs are clear. Extensive bruising down the left lateral back. Cardiac exam shows a regular rate and rhythm. Abdomen is soft. Extremities are with edema. Gait and ROM are intact. No gross neurologic deficits noted.   LABORATORY DATA:  Lab Results    Component Value Date   WBC 4.0 09/03/2011   HGB 12.6* 09/03/2011   HCT 37.4* 09/03/2011   PLT 66* 09/03/2011   GLUCOSE 79 08/31/2011   ALT 19 01/12/2011   AST 32 01/12/2011   NA 136 08/31/2011   K 4.7 08/31/2011   CL 102 08/31/2011   CREATININE 1.9* 08/31/2011   BUN 39* 08/31/2011   CO2 26 08/31/2011   TSH 1.00 07/18/2011   INR 1.27 10/20/2010   HGBA1C 6.0 10/18/2010    Assessment / Plan:  1. PAF - EKG today shows sinus bradycardia with 1st degree AV block. He has already cut his amiodarone back. He is not a candidate for coumadin. His options are limited but at least he is back in sinus for now.  2. Fall - may have had a rib fracture. Encouraged him to cough and deep breath. He is seeing Dr. Alwyn Ren later today.   3. Diastolic heart failure - His weight is up. He has had recent NSAID use which I suspect is more the culprit. I have asked him to stop the Aleve. He may use an extra Lasix for the next 2 days.   4. HTN - BP has now come down nicely with the addition of Hydralazine.   His Hydralazine and Allopurinol are refilled today. He is to see Dr. Daleen Squibb later this month. I will see him back in about mid December. Patient is agreeable to this plan and will call if any problems develop in the interim.

## 2011-09-18 NOTE — Assessment & Plan Note (Signed)
The risk of health or life threatening bleeding from trauma was discussed in the context of thrombocytopenia. Fall prevention stressed. Because of low platelet count, avoid excess aspirin , ibuprofen, naproxen etc .Repeat platelet count if  any abnormal bruising or bleeding occur.

## 2011-09-19 ENCOUNTER — Ambulatory Visit (INDEPENDENT_AMBULATORY_CARE_PROVIDER_SITE_OTHER)
Admission: RE | Admit: 2011-09-19 | Discharge: 2011-09-19 | Disposition: A | Discharge status: Home / Self Care | Payer: Medicare Other | Source: Ambulatory Visit | Attending: Internal Medicine | Admitting: Internal Medicine

## 2011-09-19 ENCOUNTER — Other Ambulatory Visit (INDEPENDENT_AMBULATORY_CARE_PROVIDER_SITE_OTHER): Payer: Medicare Other

## 2011-09-19 ENCOUNTER — Telehealth: Payer: Self-pay | Admitting: Cardiology

## 2011-09-19 DIAGNOSIS — R7309 Other abnormal glucose: Secondary | ICD-10-CM

## 2011-09-19 DIAGNOSIS — S299XXA Unspecified injury of thorax, initial encounter: Secondary | ICD-10-CM

## 2011-09-19 DIAGNOSIS — I10 Essential (primary) hypertension: Secondary | ICD-10-CM

## 2011-09-19 DIAGNOSIS — S298XXA Other specified injuries of thorax, initial encounter: Secondary | ICD-10-CM

## 2011-09-19 LAB — LIPID PANEL
Cholesterol: 78 mg/dL (ref 0–200)
HDL: 35.6 mg/dL — ABNORMAL LOW (ref >39.00)

## 2011-09-19 LAB — HEPATIC FUNCTION PANEL
ALT: 24 U/L (ref 0–53)
AST: 28 U/L (ref 0–37)
Bilirubin, Direct: 0.2 mg/dL (ref 0.0–0.3)
Total Bilirubin: 0.8 mg/dL (ref 0.3–1.2)
Total Protein: 6.4 g/dL (ref 6.0–8.3)

## 2011-09-19 LAB — HEMOGLOBIN A1C: Hgb A1c MFr Bld: 5.4 % (ref 4.6–6.5)

## 2011-09-19 NOTE — Telephone Encounter (Signed)
Pt received klor con from mail order, dosage different from what he was taking, pls call [phone]

## 2011-09-19 NOTE — Telephone Encounter (Signed)
Pt  Calls today regarding potassium prescription. He has been  taking 1/2 of a Potassium daily for many months. Checked past labs & K+ was 4.7 Pt is feeling better except that his back and side still hurt "right much" from where he fell.  Does not hurt to take a deep breath in. Corrected potassium dosage. Mylo Red RN

## 2011-09-21 ENCOUNTER — Telehealth: Payer: Self-pay | Admitting: Internal Medicine

## 2011-09-21 NOTE — Telephone Encounter (Signed)
Left message on voicemail informing patient all reports mailed. Patient to return call if he would like specific details on either report

## 2011-09-21 NOTE — Telephone Encounter (Signed)
pt called wants lab & xray results called into him ph# [phone] ok to leave mess on recorder

## 2011-10-01 ENCOUNTER — Ambulatory Visit: Payer: Medicare Other | Admitting: Cardiology

## 2011-10-04 ENCOUNTER — Encounter: Payer: Self-pay | Admitting: Cardiology

## 2011-10-04 ENCOUNTER — Ambulatory Visit (INDEPENDENT_AMBULATORY_CARE_PROVIDER_SITE_OTHER): Payer: Medicare Other | Admitting: Cardiology

## 2011-10-04 VITALS — BP 141/79 | HR 46 | Ht 70.0 in | Wt 193.0 lb

## 2011-10-04 DIAGNOSIS — R609 Edema, unspecified: Secondary | ICD-10-CM

## 2011-10-04 DIAGNOSIS — R001 Bradycardia, unspecified: Secondary | ICD-10-CM | POA: Insufficient documentation

## 2011-10-04 DIAGNOSIS — I359 Nonrheumatic aortic valve disorder, unspecified: Secondary | ICD-10-CM

## 2011-10-04 DIAGNOSIS — I5032 Chronic diastolic (congestive) heart failure: Secondary | ICD-10-CM

## 2011-10-04 DIAGNOSIS — I498 Other specified cardiac arrhythmias: Secondary | ICD-10-CM

## 2011-10-04 DIAGNOSIS — I10 Essential (primary) hypertension: Secondary | ICD-10-CM

## 2011-10-04 DIAGNOSIS — I4891 Unspecified atrial fibrillation: Secondary | ICD-10-CM

## 2011-10-04 DIAGNOSIS — I2510 Atherosclerotic heart disease of native coronary artery without angina pectoris: Secondary | ICD-10-CM

## 2011-10-04 MED ORDER — PROPRANOLOL HCL 10 MG PO TABS: 10.0000 mg | ORAL_TABLET | Freq: Three times a day (TID) | ORAL | Status: DC

## 2011-10-04 MED ORDER — HYDRALAZINE HCL 50 MG PO TABS: 50.0000 mg | ORAL_TABLET | Freq: Two times a day (BID) | ORAL | Status: DC

## 2011-10-04 NOTE — Assessment & Plan Note (Signed)
Stable. It doesn't seem to bother him so we'll not increase his Lasix. He also has aortic stenosis and needs preload.

## 2011-10-04 NOTE — Assessment & Plan Note (Signed)
Stable. Continue conservative therapy at this point.

## 2011-10-04 NOTE — Assessment & Plan Note (Signed)
Poor control. Increase hydralazine to 50 mg twice a day.

## 2011-10-04 NOTE — Assessment & Plan Note (Signed)
Stable. Continue current medical program. I will increase his hydralazine to 50 mg twice a day for better afterload reduction.

## 2011-10-04 NOTE — Assessment & Plan Note (Addendum)
We have made it clear to him and his wife that he is on 2 beta blockers. I've asked my nurse to discuss with him which he prefers. He may prefer propranolol because of his tremors. After discussion, he prefers propranolol 3 times a day.

## 2011-10-04 NOTE — Assessment & Plan Note (Signed)
Stable. Continue secondary preventative therapy. 

## 2011-10-04 NOTE — Patient Instructions (Addendum)
Your physician has recommended you make the following change in your medication:  stop Metoprolol succinate ( Toprol) Increase Hydralazine to 50mg  twice a day. Continue taking Inderal 10 mg 1 tablet three times a day  Your physician wants you to follow-up in:6 months with Dr. Daleen Squibb. You will receive a reminder letter in the mail two months in advance. If you don't receive a letter, please call our office to schedule the follow-up appointment.

## 2011-10-04 NOTE — Progress Notes (Signed)
HPI [name] returns he also day for close followup of his multiple cardiac and vascular issues.  He was recently evaluated here for some chest pain. He had fallen and had broken a lower left rib. He was noted to be bradycardic. He was relatively asymptomatic.  I note today that he is taking propranolol 10 mg by mouth 3 times a day when necessary for tremors. He is also on low-dose metoprolol. His heart rate is in the mid 40s.  He is not a Coumadin candidate because of his fall risk but also severe thrombocytopenia.  Denies any increased dyspnea on exertion, chest pain, or presyncope. He does have chronic lower extremity edema.  Past Medical History  Diagnosis Date  . Stroke   . Atrial fibrillation     amiodarone therapy; not a candidate for anticoagulation  . Bradycardia   . Aortic stenosis     echo 10/12: EF 65-70%, grade 2 diast dysfxn, severe AS, mean gradient 60 mmHg, mod LAE, PASP 47;  s/p minimally invasive tissue AVR with Dr. Silvestre Walton at [facility] 01/2011 (pre-AVR cath with no obs CAD)  . Hypertension   . Diabetes mellitus   . Anemia   . Action tremor   . Thrombocytopenia     Dr. Truett Walton  . Throat cancer     s/p resection  . Colon polyp   . Anxiety   . Splenomegaly   . Gout   . GERD (gastroesophageal reflux disease)   . Hypothyroidism   . Leukopenia     Chronic pancytopenia  . Degenerative disc disease   . Joint effusion, knee     left knee  . Synovial cyst of popliteal space   . Cellulitis of left leg 10/11-16/2012  . CKD (chronic kidney disease)     Current Outpatient Prescriptions  Medication Sig Dispense Refill  . amiodarone (PACERONE) 200 MG tablet Take 1 tablet (200 mg total) by mouth daily.      [name] Kitchen aspirin 325 MG tablet Take 325 mg by mouth daily.        . Cholecalciferol (VITAMIN D) 1000 UNITS capsule Take 1 capsule (1,000 Units total) by mouth daily.  90 capsule  3  . colchicine 0.6 MG tablet as needed. Take one tablet by mouth daily during an acute  episode of gout.      . fish oil-omega-3 fatty acids 1000 MG capsule Take 1 g by mouth daily.        . Flaxseed, Linseed, (FLAX SEED OIL) 1000 MG CAPS Take 1 capsule by mouth daily.       . furosemide (LASIX) 20 MG tablet Take 2 tablets (40 mg total) by mouth daily.  180 tablet  3  . hydrALAZINE (APRESOLINE) 50 MG tablet Take 1 tablet (50 mg total) by mouth 2 (two) times daily.  180 tablet  3  . levothyroxine (LEVOTHROID) 25 MCG tablet Take 1 tablet (25 mcg total) by mouth daily.  30 tablet  11  . LORazepam (ATIVAN) 0.5 MG tablet Take 0.5 mg by mouth every 6 (six) hours as needed. 1 qhs  prn      . Multiple Vitamin (MULTIVITAMIN) tablet Take 1 tablet by mouth daily.        . nitroGLYCERIN (NITROSTAT) 0.4 MG SL tablet Place 1 tablet (0.4 mg total) under the tongue every 5 (five) minutes as needed for chest pain.  25 tablet  6  . omeprazole (PRILOSEC) 20 MG capsule Take 1 capsule (20 mg total) by mouth daily.  90  capsule  1  . potassium chloride SA (K-DUR,KLOR-CON) 20 MEQ tablet Take 30 mEq by mouth daily. 1/2 by mouth daily      . propranolol (INDERAL) 10 MG tablet Take 1 tablet (10 mg total) by mouth 3 (three) times daily.  270 tablet  3  . vitamin C (ASCORBIC ACID) 500 MG tablet Take 1 tablet (500 mg total) by mouth daily.  90 tablet  3  . DISCONTD: hydrALAZINE (APRESOLINE) 25 MG tablet Take 1 tablet (25 mg total) by mouth 2 (two) times daily.  180 tablet  3  . DISCONTD: propranolol (INDERAL) 10 MG tablet Take 1 tablet (10 mg total) by mouth 3 (three) times daily. Sometimes only taking just BID  270 tablet  3  . allopurinol (ZYLOPRIM) 100 MG tablet Take 1 tablet (100 mg total) by mouth daily.  90 tablet  3    Allergies  Allergen Reactions  . Penicillins     Does not remember reaction (~year 1950)  . Neomycin-Bacitracin Zn-Polymyx     ? Reaction (thinks he remembers redness)    Family History  Problem Relation Age of Onset  . Colon cancer    . Stroke    . Cancer Other     colon  .  Stroke Other     History   Social History  . Marital Status: Married    Spouse Name: N/A    Number of Children: 3  . Years of Education: N/A   Occupational History  . Retired Company secretary    Social History Main Topics  . Smoking status: Former Smoker    Quit date: 01/11/1962  . Smokeless tobacco: Not on file  . Alcohol Use: No  . Drug Use: No  . Sexually Active: No   Other Topics Concern  . 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    ROS ALL NEGATIVE EXCEPT THOSE NOTED IN HPI  PE  General Appearance: well developed, well nourished in no acute distress, chronically ill HEENT: symmetrical face, PERRLA, good dentition  Neck: no JVD, thyromegaly, or adenopathy, trachea midline Chest: symmetric without deformity Cardiac: PMI non-displaced, slow irregular rate and rhythm, normal S1, S2, no gallop, aortic stenosis murmur, S2 does split. Lung: clear to ausculation and percussion Vascular: Decreased pulses in the lower extremities Abdominal: nondistended, nontender, good bowel sounds, no HSM, no bruits Extremities: no cyanosis, clubbing, 1+ bilateral pitting edema, no sign of DVT, no varicosities  Skin: normal color, no rashes Neuro: alert and oriented x 3, non-focal Pysch: normal affect  EKG Last EKG reviewed from September 10 and he was in sinus bradycardia. BMET    Component Value Date/Time   NA 136 08/31/2011 1102   K 4.7 08/31/2011 1102   CL 102 08/31/2011 1102   CO2 26 08/31/2011 1102   GLUCOSE 79 08/31/2011 1102   BUN 39* 08/31/2011 1102   CREATININE 1.9* 08/31/2011 1102   CALCIUM 9.2 08/31/2011 1102   GFRNONAA 48* 01/12/2011 1703   GFRAA 55* 01/12/2011 1703    Lipid Panel     Component Value Date/Time   CHOL 78 09/19/2011 0832   TRIG 65.0 09/19/2011 0832   HDL 35.60* 09/19/2011 0832   CHOLHDL 2 09/19/2011 0832   VLDL 13.0 09/19/2011 0832   LDLCALC 29 09/19/2011 0832    CBC    Component Value Date/Time   WBC 4.0 09/03/2011 0921    WBC 3.5* 08/31/2011 1102   RBC 4.26 09/03/2011 1610  RBC 4.02* 08/31/2011 1102   HGB 12.6* 09/03/2011 0921   HGB 11.8* 08/31/2011 1102   HCT 37.4* 09/03/2011 0921   HCT 35.5* 08/31/2011 1102   PLT 66* 09/03/2011 0921   PLT 68.0* 08/31/2011 1102   MCV 87.9 09/03/2011 0921   MCV 88.4 08/31/2011 1102   MCH 29.6 09/03/2011 0921   MCH 29.5 10/24/2010 0500   MCHC 33.6 09/03/2011 0921   MCHC 33.3 08/31/2011 1102   RDW 17.0* 09/03/2011 0921   RDW 17.0* 08/31/2011 1102   LYMPHSABS 0.7* 09/03/2011 0921   LYMPHSABS 0.7 08/31/2011 1102   MONOABS 0.2 09/03/2011 0921   MONOABS 0.2 08/31/2011 1102   EOSABS 0.1 09/03/2011 0921   EOSABS 0.0 08/31/2011 1102   BASOSABS 0.0 09/03/2011 0921   BASOSABS 0.0 08/31/2011 1102

## 2011-10-08 ENCOUNTER — Telehealth: Payer: Self-pay | Admitting: Cardiology

## 2011-10-08 NOTE — Telephone Encounter (Signed)
New Problem:    Patient called in because at 7:15 this morning his BP was 192/58.  Patient claims that his medication is not working and after about two hours his BP goes back down. Please call back.

## 2011-10-08 NOTE — Telephone Encounter (Signed)
Appt made to see [name] on 10/15/2011.

## 2011-10-15 ENCOUNTER — Ambulatory Visit (INDEPENDENT_AMBULATORY_CARE_PROVIDER_SITE_OTHER): Payer: Medicare Other | Admitting: Nurse Practitioner

## 2011-10-15 ENCOUNTER — Ambulatory Visit: Payer: Medicare Other | Admitting: Physician Assistant

## 2011-10-15 ENCOUNTER — Encounter: Payer: Self-pay | Admitting: Nurse Practitioner

## 2011-10-15 VITALS — BP 140/54 | HR 59 | Ht 70.0 in | Wt 193.1 lb

## 2011-10-15 DIAGNOSIS — I10 Essential (primary) hypertension: Secondary | ICD-10-CM

## 2011-10-15 MED ORDER — HYDRALAZINE HCL 50 MG PO TABS: 50.0000 mg | ORAL_TABLET | Freq: Three times a day (TID) | ORAL | Status: DC

## 2011-10-15 NOTE — Patient Instructions (Addendum)
Increase the Hydralazine to 50 mg three times a day  Stay on your other medicines  I will see you in December as planned.  Call us if your blood pressure does not come down. Goal is to stay around 140 or less on the top.  Call the [facility] office at [phone] if you have any questions, problems or concerns.

## 2011-10-15 NOTE — Progress Notes (Signed)
[name] Date of Birth: 26-Nov-1922 Medical Record #[id number]  History of Present Illness: [name] is seen back for an early follow up visit. He is seen for Dr. Daleen Walton. His history is as previously outlined. He has had minimally invasive AVR by Dr. Silvestre Walton at [facility] in January of this year. Does have PAF and is on chronic amiodarone. Not able to tolerate Diltiazem due to bradycardia. Not a candidate for coumadin due to his anemia, thrombocytopenia and history of falls. Other issues include HTN, DM, tremor, prior throat cancer, gout, GERD, hypothyroidism, CKD and DJD. Hydralazine has recently been added for his HTN.   He comes in today. He is here with his wife. Dr. Daleen Walton saw him about 10 days ago. Hydralazine was increased to 50 mg BID. BP is still staying up in the 160 to 190 range at home. He feels ok. Not short of breath. Has chronic edema of his legs. Limited by his feet burning. Does have some nocturia.   Current Outpatient Prescriptions on File Prior to Visit  Medication Sig Dispense Refill  . allopurinol (ZYLOPRIM) 100 MG tablet Take 1 tablet (100 mg total) by mouth daily.  90 tablet  3  . amiodarone (PACERONE) 200 MG tablet Take 1 tablet (200 mg total) by mouth daily.      [name] Kitchen aspirin 325 MG tablet Take 325 mg by mouth daily.        . Cholecalciferol (VITAMIN D) 1000 UNITS capsule Take 1 capsule (1,000 Units total) by mouth daily.  90 capsule  3  . colchicine 0.6 MG tablet as needed. Take one tablet by mouth daily during an acute episode of gout.      . fish oil-omega-3 fatty acids 1000 MG capsule Take 1 g by mouth daily.        . Flaxseed, Linseed, (FLAX SEED OIL) 1000 MG CAPS Take 1 capsule by mouth daily.       . furosemide (LASIX) 20 MG tablet Take 2 tablets (40 mg total) by mouth daily.  180 tablet  3  . levothyroxine (LEVOTHROID) 25 MCG tablet Take 1 tablet (25 mcg total) by mouth daily.  30 tablet  11  . LORazepam (ATIVAN) 0.5 MG tablet Take 0.5 mg by mouth every 6 (six) hours  as needed. 1 qhs  prn      . Multiple Vitamin (MULTIVITAMIN) tablet Take 1 tablet by mouth daily.        . nitroGLYCERIN (NITROSTAT) 0.4 MG SL tablet Place 1 tablet (0.4 mg total) under the tongue every 5 (five) minutes as needed for chest pain.  25 tablet  6  . omeprazole (PRILOSEC) 20 MG capsule Take 1 capsule (20 mg total) by mouth daily.  90 capsule  1  . potassium chloride SA (K-DUR,KLOR-CON) 20 MEQ tablet Take 30 mEq by mouth daily. 1/2 by mouth daily      . propranolol (INDERAL) 10 MG tablet Take 1 tablet (10 mg total) by mouth 3 (three) times daily.  270 tablet  3  . vitamin C (ASCORBIC ACID) 500 MG tablet Take 1 tablet (500 mg total) by mouth daily.  90 tablet  3  . DISCONTD: hydrALAZINE (APRESOLINE) 50 MG tablet Take 1 tablet (50 mg total) by mouth 2 (two) times daily.  180 tablet  3    Allergies  Allergen Reactions  . Penicillins     Does not remember reaction (~year 1950)  . Neomycin-Bacitracin Zn-Polymyx     ? Reaction (thinks he remembers redness)  Past Medical History  Diagnosis Date  . Stroke   . Atrial fibrillation     amiodarone therapy; not a candidate for anticoagulation  . Bradycardia   . Aortic stenosis     echo 10/12: EF 65-70%, grade 2 diast dysfxn, severe AS, mean gradient 60 mmHg, mod LAE, PASP 47;  s/p minimally invasive tissue AVR with Dr. Silvestre Walton at [facility] 01/2011 (pre-AVR cath with no obs CAD)  . Hypertension   . Diabetes mellitus   . Anemia   . Action tremor   . Thrombocytopenia     Dr. Truett Walton  . Throat cancer     s/p resection  . Colon polyp   . Anxiety   . Splenomegaly   . Gout   . GERD (gastroesophageal reflux disease)   . Hypothyroidism   . Leukopenia     Chronic pancytopenia  . Degenerative disc disease   . Joint effusion, knee     left knee  . Synovial cyst of popliteal space   . Cellulitis of left leg 10/11-16/2012  . CKD (chronic kidney disease)     Past Surgical History  Procedure Date  . Cholecystectomy   . Joint effusion       left knee  . Aortic valve replacement 01/23/2011    via minimally invasive approach per Dr [name], [facility]    History  Smoking status  . Former Smoker  . Quit date: 01/11/1962  Smokeless tobacco  . Not on file    History  Alcohol Use No    Family History  Problem Relation Age of Onset  . Colon cancer    . Stroke    . Cancer Other     colon  . Stroke Other     Review of Systems: The review of systems is per the HPI.  All other systems were reviewed and are negative.  Physical Exam: BP 140/54  Pulse 59  Ht 5\' 10"  (1.778 m)  Wt 193 lb 1.9 oz (87.599 kg)  BMI 27.71 kg/m2 Repeat blood pressure by me is 140/54 from 168/50 when he first arrived. Patient is very pleasant and in no acute distress. Skin is warm and dry. Color is normal.  HEENT is unremarkable. Normocephalic/atraumatic. PERRL. Sclera are nonicteric. Neck is supple. No masses. No JVD. Lungs are clear. Cardiac exam shows a regular rate and rhythm. Outflow murmur noted.  Abdomen is soft. Extremities are with trace edema. He has his support stockings. Gait and ROM are intact. No gross neurologic deficits noted.  LABORATORY DATA:  Lab Results  Component Value Date   WBC 4.0 09/03/2011   HGB 12.6* 09/03/2011   HCT 37.4* 09/03/2011   PLT 66* 09/03/2011   GLUCOSE 79 08/31/2011   CHOL 78 09/19/2011   TRIG 65.0 09/19/2011   HDL 35.60* 09/19/2011   LDLCALC 29 09/19/2011   ALT 24 09/19/2011   AST 28 09/19/2011   NA 136 08/31/2011   K 4.7 08/31/2011   CL 102 08/31/2011   CREATININE 1.9* 08/31/2011   BUN 39* 08/31/2011   CO2 26 08/31/2011   TSH 1.00 07/18/2011   INR 1.27 10/20/2010   HGBA1C 5.4 09/19/2011     Assessment / Plan:  HTN - Repeat blood pressure by me is satisfactory. He brings in pages of readings that just show an increase in his systolic readings over the past 4 to 6 weeks at home. I have increased his Hydralazine to TID. I will tentatively see him back in December. His wife will call if  we do not have  improvement in his BP. May need to consider Cardura. Could try Norvasc but may aggravate his swelling. I don't think ACE/ARB is a good choice given his CKD. He cannot have other rate slowing medicines.   Patient is agreeable to this plan and will call if any problems develop in the interim.

## 2011-10-24 ENCOUNTER — Telehealth: Payer: Self-pay | Admitting: Cardiology

## 2011-10-24 NOTE — Telephone Encounter (Signed)
New Problem:    Patient called in needing a refill of his levothyroxine (LEVOTHROID) 25 MCG tablet Prime mail (phone#- [phone]).  Please call back if you have any questions.  This is the patient's permanent pharmacy.

## 2011-10-25 ENCOUNTER — Other Ambulatory Visit: Payer: Self-pay

## 2011-10-25 DIAGNOSIS — E039 Hypothyroidism, unspecified: Secondary | ICD-10-CM

## 2011-10-25 MED ORDER — LEVOTHYROXINE SODIUM 25 MCG PO TABS: 25.0000 ug | ORAL_TABLET | Freq: Every day | ORAL | Status: DC

## 2011-10-29 ENCOUNTER — Telehealth: Payer: Self-pay | Admitting: Cardiology

## 2011-10-29 MED ORDER — AMLODIPINE BESYLATE 5 MG PO TABS: 5.0000 mg | ORAL_TABLET | Freq: Every day | ORAL | Status: DC

## 2011-10-29 NOTE — Telephone Encounter (Signed)
Busy unable to leave a message

## 2011-10-29 NOTE — Telephone Encounter (Signed)
New Problem:    Patient called in because his BP is still running high despite changing his medication.  Yesterday his BP was 198 and this morning it is 186/62. Please call back.

## 2011-10-29 NOTE — Telephone Encounter (Signed)
Spoke with pt. Pt states BP this morning before medications was 186/62. Pt  took meds about 8am this morning-BP now  175/58. Pt states  hydralazine was increased 10/14/11.  BP readings since hydralazine increased in the morning before morning medications:  10/15/11 178/54  10/16/11 162/50  10/17/11 187/57  10/18/11 161/54  10/19/11 167/53  10/20/11 158/51  10/21/11 164/55 10/22/11 183/58 10/23/11 166/58 01/24/11 167/51 10/25/11 178/54 10/26/11 190/56 10/27/11 180/56 10/28/11 198/63 10/29/11 186/62. Pt feels OK except for waking up with a headache most mornings. I will forward to Sunday Spillers for review and recommendations.

## 2011-10-29 NOTE — Telephone Encounter (Signed)
LMTCB

## 2011-10-29 NOTE — Telephone Encounter (Signed)
Follow-up:    Patient returned your call.  Please call back. 

## 2011-10-29 NOTE — Telephone Encounter (Signed)
Let's try to add Norvasc 5 mg a day. This may aggravate his swelling in his legs. Needs to use support stockings. Restrict salt. Continue to monitor blood pressure at home. Call with an update in a week.

## 2011-10-29 NOTE — Telephone Encounter (Signed)
Pt advised,verbalized understanding. 

## 2011-11-08 ENCOUNTER — Telehealth: Payer: Self-pay | Admitting: Cardiology

## 2011-11-08 DIAGNOSIS — I10 Essential (primary) hypertension: Secondary | ICD-10-CM

## 2011-11-08 NOTE — Telephone Encounter (Signed)
Plz return call to patient [phone] regarding issues with blood pressure meds. Would like to speak with [name].

## 2011-11-08 NOTE — Telephone Encounter (Signed)
Can someone call him. I have left for the day.  Thanks

## 2011-11-08 NOTE — Telephone Encounter (Signed)
Pt states he has been taking his medications as RXed including the amlodipine 5 mg a day for the past 10 days.  The lowest BP he has gotten was 157/55.  The highest its been was 190,  It usually runs around 182/60 most days and his heart rate has been in the 50's.   Advised pt I will forward this information to [facility] tomorrow and he will be called back with medication changes as ordered.

## 2011-11-09 ENCOUNTER — Telehealth: Payer: Self-pay | Admitting: *Deleted

## 2011-11-09 MED ORDER — DOXAZOSIN MESYLATE 2 MG PO TABS: 1.0000 mg | ORAL_TABLET | Freq: Every day | ORAL | Status: DC

## 2011-11-09 NOTE — Telephone Encounter (Signed)
t/w  pt is not takingklor-con I will take off of med list 

## 2011-11-09 NOTE — Telephone Encounter (Signed)
He may try Cardura 2 mg tablets - to start out only on a half of a tablet (1mg ) at night. Please send RX to the drug store. Ask them to call back in 2 weeks with BP report.

## 2011-11-15 ENCOUNTER — Telehealth: Payer: Self-pay | Admitting: Cardiology

## 2011-11-15 ENCOUNTER — Encounter: Payer: Self-pay | Admitting: *Deleted

## 2011-11-15 DIAGNOSIS — I10 Essential (primary) hypertension: Secondary | ICD-10-CM

## 2011-11-15 MED ORDER — AMLODIPINE BESYLATE 5 MG PO TABS: 5.0000 mg | ORAL_TABLET | Freq: Every day | ORAL | Status: DC

## 2011-11-15 MED ORDER — DOXAZOSIN MESYLATE 2 MG PO TABS: 1.0000 mg | ORAL_TABLET | Freq: Every day | ORAL | Status: DC

## 2011-11-15 NOTE — Telephone Encounter (Signed)
Pt is aware. Refills sent in for Amlodipine and Cardura Mylo Red RN

## 2011-11-15 NOTE — Telephone Encounter (Signed)
Sunday 134/72 Monday 168/55 Tuesday 132/48 Wednesday 151/51 today 119/50 Pt was put on a new b/p med by Norma Fredrickson and this is his readings for the week and if she needs to continue amlodipine besylate 5mg  please call into [facility] on [address] For 7 - 14 days and send in a permanent refill to [facility]

## 2011-11-15 NOTE — Telephone Encounter (Signed)
-   Continue amlodipine ?

## 2011-11-20 ENCOUNTER — Telehealth: Payer: Self-pay | Admitting: Nurse Practitioner

## 2011-11-20 NOTE — Telephone Encounter (Signed)
t/w pts wife per Norma Fredrickson, pt will take one extra lasix today and one extra lasix tomorrow pts wife verbally understood

## 2011-11-20 NOTE — Telephone Encounter (Signed)
Pt was put on new BP med by lori, doxazocin,  pt has been on it for 10 days and had gained 5 lbs, pls advise [phone]

## 2011-11-29 NOTE — Telephone Encounter (Signed)
Ok to stop the Cardura. Monitor blood pressure. Extra lasix as needed.

## 2011-11-29 NOTE — Telephone Encounter (Signed)
t/w pt stopped cardura and take extra lasixs as needed pt stated he is taking two lasix in the am and one in the pm

## 2011-11-29 NOTE — Telephone Encounter (Signed)
Follow-up:    Patient called in because he is still retaining fluid.  Patient weighs 195.8 this morning.  States that he has gained 10 lbs since beginning his new BP med. Please call back.

## 2011-12-21 ENCOUNTER — Encounter: Payer: Self-pay | Admitting: Nurse Practitioner

## 2011-12-21 ENCOUNTER — Ambulatory Visit (INDEPENDENT_AMBULATORY_CARE_PROVIDER_SITE_OTHER): Payer: Medicare Other | Admitting: Nurse Practitioner

## 2011-12-21 VITALS — BP 130/46 | HR 52 | Ht 70.0 in | Wt 198.4 lb

## 2011-12-21 DIAGNOSIS — I10 Essential (primary) hypertension: Secondary | ICD-10-CM

## 2011-12-21 LAB — CBC WITH DIFFERENTIAL/PLATELET
Basophils Absolute: 0 10*3/uL (ref 0.0–0.1)
Basophils Relative: 0.4 % (ref 0.0–3.0)
Eosinophils Absolute: 0.1 10*3/uL (ref 0.0–0.7)
Eosinophils Relative: 1.5 % (ref 0.0–5.0)
HCT: 33.2 % — ABNORMAL LOW (ref 39.0–52.0)
Hemoglobin: 11.2 g/dL — ABNORMAL LOW (ref 13.0–17.0)
Lymphocytes Relative: 18 % (ref 12.0–46.0)
Lymphs Abs: 0.7 10*3/uL (ref 0.7–4.0)
MCHC: 33.8 g/dL (ref 30.0–36.0)
MCV: 90 fl (ref 78.0–100.0)
Monocytes Absolute: 0.2 10*3/uL (ref 0.1–1.0)
Monocytes Relative: 5.7 % (ref 3.0–12.0)
Neutro Abs: 2.8 10*3/uL (ref 1.4–7.7)
Neutrophils Relative %: 74.4 % (ref 43.0–77.0)
Platelets: 66 10*3/uL — ABNORMAL LOW (ref 150.0–400.0)
RBC: 3.68 Mil/uL — ABNORMAL LOW (ref 4.22–5.81)
RDW: 16 % — ABNORMAL HIGH (ref 11.5–14.6)
WBC: 3.8 10*3/uL — ABNORMAL LOW (ref 4.5–10.5)

## 2011-12-21 LAB — BASIC METABOLIC PANEL
BUN: 40 mg/dL — ABNORMAL HIGH (ref 6–23)
CO2: 26 mEq/L (ref 19–32)
Calcium: 9.2 mg/dL (ref 8.4–10.5)
Chloride: 102 mEq/L (ref 96–112)
Creatinine, Ser: 2.1 mg/dL — ABNORMAL HIGH (ref 0.4–1.5)
GFR: 32.75 mL/min — ABNORMAL LOW (ref >60.00)
Glucose, Bld: 146 mg/dL — ABNORMAL HIGH (ref 70–99)
Potassium: 4.3 mEq/L (ref 3.5–5.1)
Sodium: 136 mEq/L (ref 135–145)

## 2011-12-21 LAB — TSH: TSH: 5.08 u[IU]/mL (ref 0.35–5.50)

## 2011-12-21 MED ORDER — OMEPRAZOLE 20 MG PO CPDR: 20.0000 mg | DELAYED_RELEASE_CAPSULE | Freq: Every day | ORAL | Status: DC

## 2011-12-21 NOTE — Patient Instructions (Signed)
I think you are doing well.  Stay on your current medicines  Stay active  We need to check labs today  We will see you back in 3 months.  Call the [facility] office at [phone] if you have any questions, problems or concerns.

## 2011-12-21 NOTE — Progress Notes (Signed)
[name] Date of Birth: 16-Apr-1922 Medical Record #[id number]  History of Present Illness: [name] is seen back today for a 2 month check. He is seen for Dr. Daleen Squibb. He has a history of minimally invasive AVR by Dr. Silvestre Mesi at [facility] in January of this year. Follow up echo done in February of this year. Has PAF and is on chronic amiodarone. Not a candidate for coumadin due to anemia, thrombocytopenia and falls. Other issues include HTN (hard to control), DM, tremor, prior throat cancer, gout, GERD, hypothyroidism, CKD and DJD. He has had a tendency towards bradycardia and has not been able to tolerate Cardizem.   We have most recently been having issues with his blood pressure. Hydralazine has been added. He has not tolerated Cardura due to fluid retention.   He comes in today. He is here with his wife. He is doing pretty good. Blood pressure hasn't been too bad at home. For the most part, averaging about 150. Tolerating his medicines. No chest pain. Fatigues easily. Tried to split wood the other day. Got tired. Has chronic swelling in his legs. Does eat out and probably has too much salt. No atrial fib. Overall they both think he is doing ok.  Current Outpatient Prescriptions on File Prior to Visit  Medication Sig Dispense Refill  . allopurinol (ZYLOPRIM) 100 MG tablet Take 1 tablet (100 mg total) by mouth daily.  90 tablet  3  . amiodarone (PACERONE) 200 MG tablet Take 1 tablet (200 mg total) by mouth daily.      [name] Kitchen amLODipine (NORVASC) 5 MG tablet Take 1 tablet (5 mg total) by mouth daily.  90 tablet  3  . aspirin 325 MG tablet Take 325 mg by mouth daily.        . Cholecalciferol (VITAMIN D) 1000 UNITS capsule Take 1 capsule (1,000 Units total) by mouth daily.  90 capsule  3  . colchicine 0.6 MG tablet as needed. Take one tablet by mouth daily during an acute episode of gout.      . fish oil-omega-3 fatty acids 1000 MG capsule Take 1 g by mouth daily.        . Flaxseed, Linseed, (FLAX  SEED OIL) 1000 MG CAPS Take 1 capsule by mouth daily.       . furosemide (LASIX) 20 MG tablet Take 2 tablets (40 mg total) by mouth daily.  180 tablet  3  . hydrALAZINE (APRESOLINE) 50 MG tablet Take 1 tablet (50 mg total) by mouth 3 (three) times daily.  90 tablet  6  . levothyroxine (LEVOTHROID) 25 MCG tablet Take 1 tablet (25 mcg total) by mouth daily.  90 tablet  3  . LORazepam (ATIVAN) 0.5 MG tablet Take 0.5 mg by mouth every 6 (six) hours as needed. 1 qhs  prn      . Multiple Vitamin (MULTIVITAMIN) tablet Take 1 tablet by mouth daily.        . nitroGLYCERIN (NITROSTAT) 0.4 MG SL tablet Place 1 tablet (0.4 mg total) under the tongue every 5 (five) minutes as needed for chest pain.  25 tablet  6  . omeprazole (PRILOSEC) 20 MG capsule Take 1 capsule (20 mg total) by mouth daily.  90 capsule  1  . potassium chloride SA (K-DUR,KLOR-CON) 20 MEQ tablet Take 30 mEq by mouth daily. 1/2 by mouth daily      . propranolol (INDERAL) 10 MG tablet Take 1 tablet (10 mg total) by mouth 3 (three) times daily.  270 tablet  3  . vitamin C (ASCORBIC ACID) 500 MG tablet Take 1 tablet (500 mg total) by mouth daily.  90 tablet  3    Allergies  Allergen Reactions  . Penicillins     Does not remember reaction (~year 1950)  . Neomycin-Bacitracin Zn-Polymyx     ? Reaction (thinks he remembers redness)    Past Medical History  Diagnosis Date  . Stroke   . Atrial fibrillation     amiodarone therapy; not a candidate for anticoagulation  . Bradycardia   . Aortic stenosis     echo 10/12: EF 65-70%, grade 2 diast dysfxn, severe AS, mean gradient 60 mmHg, mod LAE, PASP 47;  s/p minimally invasive tissue AVR with Dr. Silvestre Mesi at [facility] 01/2011 (pre-AVR cath with no obs CAD)  . Hypertension   . Diabetes mellitus   . Anemia   . Action tremor   . Thrombocytopenia     Dr. [name]  . Throat cancer     s/p resection  . Colon polyp   . Anxiety   . Splenomegaly   . Gout   . GERD (gastroesophageal reflux disease)   .  Hypothyroidism   . Leukopenia     Chronic pancytopenia  . Degenerative disc disease   . Joint effusion, knee     left knee  . Synovial cyst of popliteal space   . Cellulitis of left leg 10/11-16/2012  . CKD (chronic kidney disease)     Past Surgical History  Procedure Date  . Cholecystectomy   . Joint effusion     left knee  . Aortic valve replacement 01/23/2011    via minimally invasive approach per Dr Silvestre Mesi, [facility]    History  Smoking status  . Former Smoker  . Quit date: 01/11/1962  Smokeless tobacco  . Not on file    History  Alcohol Use No    Family History  Problem Relation Age of Onset  . Colon cancer    . Stroke    . Cancer Other     colon  . Stroke Other     Review of Systems: The review of systems is per the HPI.  All other systems were reviewed and are negative.  Physical Exam: BP 130/46  Pulse 52  Ht 5\' 10"  (1.778 m)  Wt 198 lb 6.4 oz (89.994 kg)  BMI 28.47 kg/m2 Weight is up about 5 pounds over the past 2 months.  Patient is very pleasant and in no acute distress. Skin is warm and dry. Color is normal.  His arms are quite ecchymotic and discolored from his amiodarone.  HEENT is unremarkable. Normocephalic/atraumatic. PERRL. Sclera are nonicteric. Neck is supple. No masses. No JVD. Lungs are clear. Cardiac exam shows a regular rate and rhythm. Harsh outflow murmur. Abdomen is soft. Extremities are with 1+ edema. He has his support stockings in place. Gait and ROM are intact. No gross neurologic deficits noted.   LABORATORY DATA: Pending for today.   Lab Results  Component Value Date   WBC 4.0 09/03/2011   HGB 12.6* 09/03/2011   HCT 37.4* 09/03/2011   PLT 66* 09/03/2011   GLUCOSE 79 08/31/2011   CHOL 78 09/19/2011   TRIG 65.0 09/19/2011   HDL 35.60* 09/19/2011   LDLCALC 29 09/19/2011   ALT 24 09/19/2011   AST 28 09/19/2011   NA 136 08/31/2011   K 4.7 08/31/2011   CL 102 08/31/2011   CREATININE 1.9* 08/31/2011   BUN 39* 08/31/2011  CO2 26 08/31/2011     TSH 1.00 07/18/2011   INR 1.27 10/20/2010   HGBA1C 5.4 09/19/2011     Assessment / Plan: 1. HTN - blood pressure not too bad. No change in his medicines.   2. PAF - remains on amiodarone. Not a candidate for coumadin. In sinus on exam today.  3. AVR - prior minimally invasive AVR at [facility] almost one year ago. Has an outflow murmur with presumed perivalvular leak. Do not think repeat echo will change our course of therapy.   Overall, I think he is doing ok. He needs follow up labs today. No change in his medicines. I will see him back in 3 months. Patient is agreeable to this plan and will call if any problems develop in the interim.

## 2011-12-29 ENCOUNTER — Observation Stay (HOSPITAL_COMMUNITY)
Admission: EM | Admit: 2011-12-29 | Discharge: 2011-12-30 | Disposition: A | Discharge status: Home / Self Care | Payer: Medicare Other | Attending: Family Medicine | Admitting: Family Medicine

## 2011-12-29 ENCOUNTER — Encounter (HOSPITAL_COMMUNITY): Payer: Self-pay | Admitting: Family Medicine

## 2011-12-29 DIAGNOSIS — C148 Malignant neoplasm of overlapping sites of lip, oral cavity and pharynx: Secondary | ICD-10-CM

## 2011-12-29 DIAGNOSIS — L0291 Cutaneous abscess, unspecified: Secondary | ICD-10-CM

## 2011-12-29 DIAGNOSIS — I2510 Atherosclerotic heart disease of native coronary artery without angina pectoris: Secondary | ICD-10-CM | POA: Insufficient documentation

## 2011-12-29 DIAGNOSIS — L02419 Cutaneous abscess of limb, unspecified: Principal | ICD-10-CM | POA: Insufficient documentation

## 2011-12-29 DIAGNOSIS — I6350 Cerebral infarction due to unspecified occlusion or stenosis of unspecified cerebral artery: Secondary | ICD-10-CM

## 2011-12-29 DIAGNOSIS — E119 Type 2 diabetes mellitus without complications: Secondary | ICD-10-CM

## 2011-12-29 DIAGNOSIS — D649 Anemia, unspecified: Secondary | ICD-10-CM

## 2011-12-29 DIAGNOSIS — R609 Edema, unspecified: Secondary | ICD-10-CM

## 2011-12-29 DIAGNOSIS — Z79899 Other long term (current) drug therapy: Secondary | ICD-10-CM | POA: Insufficient documentation

## 2011-12-29 DIAGNOSIS — I10 Essential (primary) hypertension: Secondary | ICD-10-CM

## 2011-12-29 DIAGNOSIS — L0390 Cellulitis, unspecified: Secondary | ICD-10-CM

## 2011-12-29 DIAGNOSIS — K219 Gastro-esophageal reflux disease without esophagitis: Secondary | ICD-10-CM | POA: Insufficient documentation

## 2011-12-29 DIAGNOSIS — R161 Splenomegaly, not elsewhere classified: Secondary | ICD-10-CM

## 2011-12-29 DIAGNOSIS — N289 Disorder of kidney and ureter, unspecified: Secondary | ICD-10-CM

## 2011-12-29 DIAGNOSIS — R251 Tremor, unspecified: Secondary | ICD-10-CM

## 2011-12-29 DIAGNOSIS — R259 Unspecified abnormal involuntary movements: Secondary | ICD-10-CM | POA: Insufficient documentation

## 2011-12-29 DIAGNOSIS — I4891 Unspecified atrial fibrillation: Secondary | ICD-10-CM

## 2011-12-29 DIAGNOSIS — R001 Bradycardia, unspecified: Secondary | ICD-10-CM

## 2011-12-29 DIAGNOSIS — D708 Other neutropenia: Secondary | ICD-10-CM | POA: Insufficient documentation

## 2011-12-29 DIAGNOSIS — R7309 Other abnormal glucose: Secondary | ICD-10-CM

## 2011-12-29 DIAGNOSIS — E039 Hypothyroidism, unspecified: Secondary | ICD-10-CM | POA: Insufficient documentation

## 2011-12-29 DIAGNOSIS — F411 Generalized anxiety disorder: Secondary | ICD-10-CM

## 2011-12-29 DIAGNOSIS — G2581 Restless legs syndrome: Secondary | ICD-10-CM

## 2011-12-29 DIAGNOSIS — I509 Heart failure, unspecified: Secondary | ICD-10-CM | POA: Insufficient documentation

## 2011-12-29 DIAGNOSIS — M79609 Pain in unspecified limb: Secondary | ICD-10-CM

## 2011-12-29 DIAGNOSIS — D72819 Decreased white blood cell count, unspecified: Secondary | ICD-10-CM | POA: Diagnosis present

## 2011-12-29 DIAGNOSIS — G252 Other specified forms of tremor: Secondary | ICD-10-CM

## 2011-12-29 DIAGNOSIS — D61818 Other pancytopenia: Secondary | ICD-10-CM

## 2011-12-29 DIAGNOSIS — I5032 Chronic diastolic (congestive) heart failure: Secondary | ICD-10-CM | POA: Insufficient documentation

## 2011-12-29 DIAGNOSIS — Z9181 History of falling: Secondary | ICD-10-CM | POA: Insufficient documentation

## 2011-12-29 DIAGNOSIS — I498 Other specified cardiac arrhythmias: Secondary | ICD-10-CM | POA: Insufficient documentation

## 2011-12-29 DIAGNOSIS — I359 Nonrheumatic aortic valve disorder, unspecified: Secondary | ICD-10-CM

## 2011-12-29 DIAGNOSIS — D696 Thrombocytopenia, unspecified: Secondary | ICD-10-CM

## 2011-12-29 DIAGNOSIS — R946 Abnormal results of thyroid function studies: Secondary | ICD-10-CM

## 2011-12-29 DIAGNOSIS — R7989 Other specified abnormal findings of blood chemistry: Secondary | ICD-10-CM

## 2011-12-29 DIAGNOSIS — L03119 Cellulitis of unspecified part of limb: Secondary | ICD-10-CM | POA: Insufficient documentation

## 2011-12-29 DIAGNOSIS — IMO0002 Reserved for concepts with insufficient information to code with codable children: Secondary | ICD-10-CM

## 2011-12-29 DIAGNOSIS — M109 Gout, unspecified: Secondary | ICD-10-CM | POA: Insufficient documentation

## 2011-12-29 DIAGNOSIS — D126 Benign neoplasm of colon, unspecified: Secondary | ICD-10-CM

## 2011-12-29 DIAGNOSIS — M7120 Synovial cyst of popliteal space [Baker], unspecified knee: Secondary | ICD-10-CM | POA: Insufficient documentation

## 2011-12-29 LAB — CBC WITH DIFFERENTIAL/PLATELET
Basophils Absolute: 0 10*3/uL (ref 0.0–0.1)
Basophils Absolute: 0 10*3/uL (ref 0.0–0.1)
HCT: 29.7 % — ABNORMAL LOW (ref 39.0–52.0)
Lymphocytes Relative: 8 % — ABNORMAL LOW (ref 12–46)
Lymphocytes Relative: 5 % — ABNORMAL LOW (ref 12–46)
Lymphs Abs: 0.2 10*3/uL — ABNORMAL LOW (ref 0.7–4.0)
Monocytes Absolute: 0.3 10*3/uL (ref 0.1–1.0)
Neutro Abs: 2.8 10*3/uL (ref 1.7–7.7)
Neutrophils Relative %: 86 % — ABNORMAL HIGH (ref 43–77)
Platelets: 41 10*3/uL — ABNORMAL LOW (ref 150–400)
Platelets: 47 10*3/uL — ABNORMAL LOW (ref 150–400)
RBC: 3.37 MIL/uL — ABNORMAL LOW (ref 4.22–5.81)
RBC: 3.44 MIL/uL — ABNORMAL LOW (ref 4.22–5.81)
RDW: 15.6 % — ABNORMAL HIGH (ref 11.5–15.5)
WBC: 3.5 10*3/uL — ABNORMAL LOW (ref 4.0–10.5)
WBC: 4.3 10*3/uL (ref 4.0–10.5)

## 2011-12-29 LAB — BASIC METABOLIC PANEL
CO2: 23 mEq/L (ref 19–32)
Chloride: 106 mEq/L (ref 96–112)
Sodium: 141 mEq/L (ref 135–145)

## 2011-12-29 MED ORDER — LEVOTHYROXINE SODIUM 25 MCG PO TABS
25.0000 ug | ORAL_TABLET | Freq: Every day | ORAL | Status: DC
  Administered 2011-12-29 – 2011-12-30 (×2): 25 ug via ORAL
  Filled 2011-12-29 (×3): qty 1

## 2011-12-29 MED ORDER — LORAZEPAM 0.5 MG PO TABS: 0.5000 mg | ORAL_TABLET | Freq: Four times a day (QID) | ORAL | Status: DC | PRN

## 2011-12-29 MED ORDER — PANTOPRAZOLE SODIUM 40 MG PO TBEC
40.0000 mg | DELAYED_RELEASE_TABLET | Freq: Every day | ORAL | Status: DC
  Administered 2011-12-29 – 2011-12-30 (×2): 40 mg via ORAL
  Filled 2011-12-29 (×2): qty 1

## 2011-12-29 MED ORDER — FUROSEMIDE 40 MG PO TABS
40.0000 mg | ORAL_TABLET | Freq: Every day | ORAL | Status: DC
  Administered 2011-12-29 – 2011-12-30 (×2): 40 mg via ORAL
  Filled 2011-12-29 (×2): qty 1

## 2011-12-29 MED ORDER — OXYCODONE HCL 5 MG PO TABS: 5.0000 mg | ORAL_TABLET | ORAL | Status: DC | PRN

## 2011-12-29 MED ORDER — VANCOMYCIN HCL IN DEXTROSE 1-5 GM/200ML-% IV SOLN
1000.0000 mg | Freq: Once | INTRAVENOUS | Status: AC
  Administered 2011-12-29: 1000 mg via INTRAVENOUS
  Filled 2011-12-29: qty 200

## 2011-12-29 MED ORDER — SODIUM CHLORIDE 0.9 % IJ SOLN
3.0000 mL | Freq: Two times a day (BID) | INTRAMUSCULAR | Status: DC
  Administered 2011-12-29 – 2011-12-30 (×3): 3 mL via INTRAVENOUS

## 2011-12-29 MED ORDER — NITROGLYCERIN 0.4 MG SL SUBL: 0.4000 mg | SUBLINGUAL_TABLET | SUBLINGUAL | Status: DC | PRN

## 2011-12-29 MED ORDER — PROPRANOLOL HCL 10 MG PO TABS
10.0000 mg | ORAL_TABLET | Freq: Three times a day (TID) | ORAL | Status: DC
  Administered 2011-12-29 – 2011-12-30 (×3): 10 mg via ORAL
  Filled 2011-12-29 (×5): qty 1

## 2011-12-29 MED ORDER — AMLODIPINE BESYLATE 5 MG PO TABS
5.0000 mg | ORAL_TABLET | Freq: Every day | ORAL | Status: DC
  Administered 2011-12-29 – 2011-12-30 (×2): 5 mg via ORAL
  Filled 2011-12-29 (×2): qty 1

## 2011-12-29 MED ORDER — AMIODARONE HCL 200 MG PO TABS
200.0000 mg | ORAL_TABLET | Freq: Every day | ORAL | Status: DC
  Administered 2011-12-29 – 2011-12-30 (×2): 200 mg via ORAL
  Filled 2011-12-29 (×2): qty 1

## 2011-12-29 MED ORDER — COLCHICINE 0.6 MG PO TABS
0.6000 mg | ORAL_TABLET | Freq: Every day | ORAL | Status: DC
  Administered 2011-12-29 – 2011-12-30 (×2): 0.6 mg via ORAL
  Filled 2011-12-29 (×2): qty 1

## 2011-12-29 MED ORDER — VANCOMYCIN HCL IN DEXTROSE 1-5 GM/200ML-% IV SOLN
1000.0000 mg | INTRAVENOUS | Status: DC
  Administered 2011-12-30: 1000 mg via INTRAVENOUS
  Filled 2011-12-29 (×2): qty 200

## 2011-12-29 MED ORDER — ASPIRIN 325 MG PO TABS
325.0000 mg | ORAL_TABLET | Freq: Every day | ORAL | Status: DC
  Administered 2011-12-29 – 2011-12-30 (×2): 325 mg via ORAL
  Filled 2011-12-29 (×2): qty 1

## 2011-12-29 MED ORDER — HYDRALAZINE HCL 50 MG PO TABS
50.0000 mg | ORAL_TABLET | Freq: Three times a day (TID) | ORAL | Status: DC
Start: 2011-12-29 — End: 2011-12-30
  Administered 2011-12-29 – 2011-12-30 (×3): 50 mg via ORAL
  Filled 2011-12-29 (×5): qty 1

## 2011-12-29 MED ORDER — HEPARIN SODIUM (PORCINE) 5000 UNIT/ML IJ SOLN
5000.0000 [IU] | Freq: Three times a day (TID) | INTRAMUSCULAR | Status: DC
  Administered 2011-12-29 – 2011-12-30 (×4): 5000 [IU] via SUBCUTANEOUS
  Filled 2011-12-29 (×6): qty 1

## 2011-12-29 MED ORDER — FENTANYL CITRATE 0.05 MG/ML IJ SOLN
25.0000 ug | Freq: Once | INTRAMUSCULAR | Status: AC
  Administered 2011-12-29: 25 ug via INTRAVENOUS
  Filled 2011-12-29: qty 2

## 2011-12-29 MED ORDER — ALLOPURINOL 100 MG PO TABS
100.0000 mg | ORAL_TABLET | Freq: Every day | ORAL | Status: DC
  Administered 2011-12-29 – 2011-12-30 (×2): 100 mg via ORAL
  Filled 2011-12-29 (×2): qty 1

## 2011-12-29 NOTE — Progress Notes (Signed)
ANTIBIOTIC CONSULT NOTE - INITIAL  Pharmacy Consult for Vancomycin Indication: RLE cellulitis  Allergies  Allergen Reactions  . Penicillins     Does not remember reaction (~year 1950)  . Neomycin-Bacitracin Zn-Polymyx     ? Reaction (thinks he remembers redness)    Patient Measurements: Height: 5\' 10"  (177.8 cm) Weight: 195 lb 1.7 oz (88.5 kg) IBW/kg (Calculated) : 73  Adjusted Body Weight:   Vital Signs: Temp: 99.5 F (37.5 C) (12/21 1312) Temp src: Oral (12/21 1312) BP: 165/60 mmHg (12/21 1312) Pulse Rate: 59  (12/21 1312) Intake/Output from previous day:   Intake/Output from this shift:    Labs:  [facility] 12/29/11 0807  WBC 3.5*  HGB 10.0*  PLT 41*  LABCREA --  CREATININE 1.93*   Estimated Creatinine Clearance: 30.2 ml/min (by C-G formula based on Cr of 1.93). No results found for this basename: VANCOTROUGH:2,VANCOPEAK:2,VANCORANDOM:2,GENTTROUGH:2,GENTPEAK:2,GENTRANDOM:2,TOBRATROUGH:2,TOBRAPEAK:2,TOBRARND:2,AMIKACINPEAK:2,AMIKACINTROU:2,AMIKACIN:2, in the last 72 hours   Microbiology: No results found for this or any previous visit (from the past 720 hour(s)).  Medical History: Past Medical History  Diagnosis Date  . Stroke   . Atrial fibrillation     amiodarone therapy; not a candidate for anticoagulation  . Bradycardia   . Aortic stenosis     echo 10/12: EF 65-70%, grade 2 diast dysfxn, severe AS, mean gradient 60 mmHg, mod LAE, PASP 47;  s/p minimally invasive tissue AVR with Dr. Silvestre Mesi at [facility] 01/2011 (pre-AVR cath with no obs CAD)  . Hypertension   . Diabetes mellitus   . Anemia   . Action tremor   . Thrombocytopenia     Dr. [name]  . Throat cancer     s/p resection  . Colon polyp   . Anxiety   . Splenomegaly   . Gout   . GERD (gastroesophageal reflux disease)   . Hypothyroidism   . Leukopenia     Chronic pancytopenia  . Degenerative disc disease   . Joint effusion, knee     left knee  . Synovial cyst of popliteal space   . Cellulitis  of left leg 10/11-16/2012  . CKD (chronic kidney disease)   . CHF (congestive heart failure)     Medications:  Prescriptions prior to admission  Medication Sig Dispense Refill  . allopurinol (ZYLOPRIM) 100 MG tablet Take 1 tablet (100 mg total) by mouth daily.  90 tablet  3  . amiodarone (PACERONE) 200 MG tablet Take 1 tablet (200 mg total) by mouth daily.      [name] Kitchen amLODipine (NORVASC) 5 MG tablet Take 1 tablet (5 mg total) by mouth daily.  90 tablet  3  . aspirin 325 MG tablet Take 325 mg by mouth daily.        . Cholecalciferol (VITAMIN D) 1000 UNITS capsule Take 1 capsule (1,000 Units total) by mouth daily.  90 capsule  3  . colchicine 0.6 MG tablet Take 0.6 mg by mouth as needed. Take one tablet by mouth daily during an acute episode of gout.      . fish oil-omega-3 fatty acids 1000 MG capsule Take 1 g by mouth daily.        . Flaxseed, Linseed, (FLAX SEED OIL) 1000 MG CAPS Take 1 capsule by mouth daily.       . furosemide (LASIX) 20 MG tablet Take 2 tablets (40 mg total) by mouth daily.  180 tablet  3  . hydrALAZINE (APRESOLINE) 50 MG tablet Take 1 tablet (50 mg total) by mouth 3 (three) times daily.  90  tablet  6  . levothyroxine (LEVOTHROID) 25 MCG tablet Take 1 tablet (25 mcg total) by mouth daily.  90 tablet  3  . LORazepam (ATIVAN) 0.5 MG tablet Take 0.5 mg by mouth every 6 (six) hours as needed. For nerves/tremors      . Multiple Vitamin (MULTIVITAMIN) tablet Take 1 tablet by mouth daily.        . nitroGLYCERIN (NITROSTAT) 0.4 MG SL tablet Place 1 tablet (0.4 mg total) under the tongue every 5 (five) minutes as needed for chest pain.  25 tablet  6  . omeprazole (PRILOSEC) 20 MG capsule Take 1 capsule (20 mg total) by mouth daily.  90 capsule  1  . potassium chloride SA (K-DUR,KLOR-CON) 20 MEQ tablet Take 10 mEq by mouth daily.       . propranolol (INDERAL) 10 MG tablet Take 1 tablet (10 mg total) by mouth 3 (three) times daily.  270 tablet  3  . vitamin C (ASCORBIC ACID) 500 MG  tablet Take 1 tablet (500 mg total) by mouth daily.  90 tablet  3   Assessment: 89yom to start Vancomycin for RLE cellulitis. Patient received Vancomycin 1g in ED ~1020. Per MD notes, planning to transition to PO antibiotics in 1-2 days. Wt: 88.5kg CrCl 30 ml/min  Goal of Therapy:  Vancomycin trough level 10-15 mcg/ml  Plan:  1. Vancomycin 1g IV q24h - next dose due 12/22 2. Follow-up renal function, clinical course and antibiotic plan  [name] [phone] 12/29/2011,1:33 PM

## 2011-12-29 NOTE — ED Provider Notes (Signed)
History     CSN: [id number]  Arrival date & time 12/29/11  4098   First MD Initiated Contact with Patient 12/29/11 0754      Chief Complaint  Patient presents with  . Extremity Pain    (Consider location/radiation/quality/duration/timing/severity/associated sxs/prior treatment) Patient is a 78 y.o. male presenting with extremity pain. The history is provided by the patient.  Extremity Pain This is a new (Woke up this morning with right lower leg pain that is tender to the touch) problem. The current episode started 6 to 12 hours ago. The problem occurs constantly. The problem has not changed since onset.Pertinent negatives include no abdominal pain and no shortness of breath. Associated symptoms comments: No trauma to the right lower extremity, chills, no fever. The symptoms are aggravated by walking (Palpation). Relieved by: Elevation. He has tried nothing for the symptoms. The treatment provided no relief.    Past Medical History  Diagnosis Date  . Stroke   . Atrial fibrillation     amiodarone therapy; not a candidate for anticoagulation  . Bradycardia   . Aortic stenosis     echo 10/12: EF 65-70%, grade 2 diast dysfxn, severe AS, mean gradient 60 mmHg, mod LAE, PASP 47;  s/p minimally invasive tissue AVR with Dr. Silvestre Mesi at [facility] 01/2011 (pre-AVR cath with no obs CAD)  . Hypertension   . Diabetes mellitus   . Anemia   . Action tremor   . Thrombocytopenia     Dr. [name]  . Throat cancer     s/p resection  . Colon polyp   . Anxiety   . Splenomegaly   . Gout   . GERD (gastroesophageal reflux disease)   . Hypothyroidism   . Leukopenia     Chronic pancytopenia  . Degenerative disc disease   . Joint effusion, knee     left knee  . Synovial cyst of popliteal space   . Cellulitis of left leg 10/11-16/2012  . CKD (chronic kidney disease)     Past Surgical History  Procedure Date  . Cholecystectomy   . Joint effusion     left knee  . Aortic valve replacement  01/23/2011    via minimally invasive approach per Dr Silvestre Mesi, [facility]    Family History  Problem Relation Age of Onset  . Colon cancer    . Stroke    . Cancer Other     colon  . Stroke Other     History  Substance Use Topics  . Smoking status: Former Smoker    Quit date: 01/11/1962  . Smokeless tobacco: Not on file  . Alcohol Use: No      Review of Systems  Constitutional: Positive for chills. Negative for fever.  Respiratory: Negative for shortness of breath.   Gastrointestinal: Negative for abdominal pain.  Musculoskeletal: Negative for myalgias and joint swelling.  All other systems reviewed and are negative.    Allergies  Penicillins and Neomycin-bacitracin zn-polymyx  Home Medications   Current Outpatient Rx  Name  Route  Sig  Dispense  Refill  . ALLOPURINOL 100 MG PO TABS   Oral   Take 1 tablet (100 mg total) by mouth daily.   90 tablet   3   . AMIODARONE HCL 200 MG PO TABS   Oral   Take 1 tablet (200 mg total) by mouth daily.         [name] Kitchen AMLODIPINE BESYLATE 5 MG PO TABS   Oral   Take 1 tablet (5 mg total)  by mouth daily.   90 tablet   3   . ASPIRIN 325 MG PO TABS   Oral   Take 325 mg by mouth daily.           [name] Kitchen VITAMIN D 1000 UNITS PO CAPS   Oral   Take 1 capsule (1,000 Units total) by mouth daily.   90 capsule   3   . COLCHICINE 0.6 MG PO TABS   Oral   Take 0.6 mg by mouth as needed. Take one tablet by mouth daily during an acute episode of gout.         . OMEGA-3 FATTY ACIDS 1000 MG PO CAPS   Oral   Take 1 g by mouth daily.           [name] Kitchen FLAX SEED OIL 1000 MG PO CAPS   Oral   Take 1 capsule by mouth daily.          . FUROSEMIDE 20 MG PO TABS   Oral   Take 2 tablets (40 mg total) by mouth daily.   180 tablet   3   . HYDRALAZINE HCL 50 MG PO TABS   Oral   Take 1 tablet (50 mg total) by mouth 3 (three) times daily.   90 tablet   6   . LEVOTHYROXINE SODIUM 25 MCG PO TABS   Oral   Take 1 tablet (25 mcg total) by mouth  daily.   90 tablet   3   . LORAZEPAM 0.5 MG PO TABS   Oral   Take 0.5 mg by mouth every 6 (six) hours as needed. For nerves/tremors         . ONE-DAILY MULTI VITAMINS PO TABS   Oral   Take 1 tablet by mouth daily.           [name] Kitchen NITROGLYCERIN 0.4 MG SL SUBL   Sublingual   Place 1 tablet (0.4 mg total) under the tongue every 5 (five) minutes as needed for chest pain.   25 tablet   6   . OMEPRAZOLE 20 MG PO CPDR   Oral   Take 1 capsule (20 mg total) by mouth daily.   90 capsule   1   . POTASSIUM CHLORIDE CRYS ER 20 MEQ PO TBCR   Oral   Take 10 mEq by mouth daily.          [name] Kitchen PROPRANOLOL HCL 10 MG PO TABS   Oral   Take 1 tablet (10 mg total) by mouth 3 (three) times daily.   270 tablet   3   . VITAMIN C 500 MG PO TABS   Oral   Take 1 tablet (500 mg total) by mouth daily.   90 tablet   3     BP 132/57  Pulse 64  Temp 97.8 F (36.6 C) (Oral)  Resp 20  SpO2 97%  Physical Exam  Nursing note and vitals reviewed. Constitutional: He is oriented to person, place, and time. He appears well-developed and well-nourished. No distress.  HENT:  Head: Normocephalic and atraumatic.  Mouth/Throat: Oropharynx is clear and moist.  Eyes: Conjunctivae normal and EOM are normal. Pupils are equal, round, and reactive to light.  Neck: Normal range of motion. Neck supple.  Cardiovascular: Normal rate, regular rhythm and intact distal pulses.   Murmur heard. Pulmonary/Chest: Effort normal and breath sounds normal. No respiratory distress. He has no wheezes. He has no rales.  Abdominal: Soft. He exhibits no distension. There is no  tenderness. There is no rebound and no guarding.  Musculoskeletal: Normal range of motion. He exhibits edema. He exhibits no tenderness.       Right lower leg: He exhibits tenderness.       Legs:      2+ pitting edema in bilateral lower extremities to the midshin  Neurological: He is alert and oriented to person, place, and time.  Skin: Skin is warm  and dry. No rash noted. No erythema.  Psychiatric: He has a normal mood and affect. His behavior is normal.    ED Course  Procedures (including critical care time)  Labs Reviewed  CBC WITH DIFFERENTIAL - Abnormal; Notable for the following:    WBC 3.5 (*)     RBC 3.37 (*)     Hemoglobin 10.0 (*)     HCT 29.7 (*)     RDW 15.6 (*)     Neutrophils Relative 81 (*)     Lymphocytes Relative 8 (*)     Lymphs Abs 0.3 (*)     All other components within normal limits  BASIC METABOLIC PANEL - Abnormal; Notable for the following:    Glucose, Bld 146 (*)     BUN 38 (*)     Creatinine, Ser 1.93 (*)     GFR calc non Af Amer 30 (*)     GFR calc Af Amer 34 (*)     All other components within normal limits   No results found.   No diagnosis found.    MDM   Patient with right lower extremity pain starting this morning. Mild warmth, erythema and tenderness to the lateral portion of the right lower leg. Neurovascularly intact with bilateral 3+ pitting edema which is not unusual for the patient. He denies any trauma but does have a history of cellulitis in the past. Also he has a history of a aortic valve replacement but is not anticoagulated other than aspirin. Concern for phlebitis versus DVT versus cellulitis. Patient complains of some mild chills but denies any known fever. CBC, BMP, venous duplex pending  9:39 AM Labs within normal limits and duplex negative for DVT.  However on repeat exam persistent and now more pronounced erythema and pain most consistent with cellulitis. Given patient's extensive medical history do not feel that he is a good candidate for CDU office. Will admit with vancomycin and 24 hour observation.      Gwyneth Sprout, MD 12/29/11 [phone]

## 2011-12-29 NOTE — ED Provider Notes (Signed)
Medical screening examination/treatment/procedure(s) were conducted as a shared visit with non-physician practitioner(s) and myself.  I personally evaluated the patient during the encounter   Gwyneth Sprout, MD 12/29/11 1546

## 2011-12-29 NOTE — ED Provider Notes (Signed)
Patient resting comfortably.  Moved here by Gwyneth Sprout, M.D. for IV antibiotic administration awaiting evaluation by the hospitalist.  His resting comfortably without complaint.  BP 143/48  Pulse 60  Temp 99 F (37.2 C) (Oral)  Resp 19  SpO2 96%   10:52 AM Pt is being examined by hospitalist and will be admitted for his cellulitis  Dierdre Forth, PA-C 12/29/11 1127

## 2011-12-29 NOTE — H&P (Signed)
[facility] History and Physical  [name]:[id number] DOB: 1922/07/26 DOA: 12/29/2011   Referring physician: Anitra Lauth, EDP PCP: [name], MD    Chief Complaint: Lower extremity swelling  HPI: [name] is a 78 y.o. male came to [facility] ed 12/29/2011 with a 1 day history of lower swelling. He states that he will awoke at 4 AM 12/29/2011 with significant pain in his right lower extremity. He admits that he also felt somewhat chilly and the fireplace and felt weak. He came to [facility] emergency room and also felt the same way.  He has no recent sick contacts, he does however get out a lot to mow the lawn, chop wood, and is as active as he can be His wife relates that he had a recent AV valve replacement done January 2 doesn't 13 at [facility]. He has been placed on aspirin 325 mg given his propensity for falls His last significant fall was September 2013 where he fell on a fishing trip and broke to posterior ribs on the right side. Patient is a very active man and shut a deer 3 days ago in his backyard.  Workup in the emergency room reveals BUN/creatinine 38/1.93-his baseline is 40/2.1 WBC = 3.5, he he will been 10.2, platelets 41, neutrophilia predominant 81%, random glucose 146 Right lower extremity venous duplex = no evidence of DVT or Baker cyst. Vital signs in emergency room showed no specific elevation of temperature but low-grade temp of 99.  Patient was given 1 g of Vancomycin.    Review of Systems: The patient denies chest pain, shortness of breath, headache-patient does have some poor vision secondary to glaucoma and was supposed to see an ophthalmologist 12/31/2011. Denies diarrhea denies dark stool denies tarry stool denies dysuria. Positive for fever positive for chills. Denies recent fall, denies dizziness, denies cough or sputum  Chart review Admission LLE cellulitis 10.11.12 ?h/o throat cancer H/o Afib, not on coumadin ?h/o TIA Colonic  polyp per report and status post colonoscopy on August 30, 2000, showing adenomatous polyp but no high-grade dysplasia or  invasive malignancy.      Past Medical History  Diagnosis Date  . Stroke   . Atrial fibrillation     amiodarone therapy; not a candidate for anticoagulation  . Bradycardia   . Aortic stenosis     echo 10/12: EF 65-70%, grade 2 diast dysfxn, severe AS, mean gradient 60 mmHg, mod LAE, PASP 47;  s/p minimally invasive tissue AVR with Dr. Silvestre Mesi at [facility] 01/2011 (pre-AVR cath with no obs CAD)  . Hypertension   . Diabetes mellitus   . Anemia   . Action tremor   . Thrombocytopenia     Dr. [name]  . Throat cancer     s/p resection  . Colon polyp   . Anxiety   . Splenomegaly   . Gout   . GERD (gastroesophageal reflux disease)   . Hypothyroidism   . Leukopenia     Chronic pancytopenia  . Degenerative disc disease   . Joint effusion, knee     left knee  . Synovial cyst of popliteal space   . Cellulitis of left leg 10/11-16/2012  . CKD (chronic kidney disease)    Past Surgical History  Procedure Date  . Cholecystectomy   . Joint effusion     left knee  . 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 Penicillins     Does not remember reaction (~year 1950)  . Neomycin-Bacitracin Zn-Polymyx     ? Reaction (thinks he remembers redness)    Family History  Problem Relation Age of Onset  . Colon cancer    . Stroke    . Cancer Other     colon  . Stroke Other      Prior to Admission medications   Medication Sig Start Date End Date Taking? Authorizing Provider  allopurinol (ZYLOPRIM) 100 MG tablet Take 1 tablet (100 mg total) by mouth daily. 09/18/11  Yes Walter Macadamia, NP  amiodarone (PACERONE) 200 MG tablet Take 1 tablet (200 mg total) by mouth daily. 09/18/11  Yes Walter Macadamia, NP  amLODipine (NORVASC) 5 MG tablet Take 1 tablet (5 mg total) by mouth daily. 11/15/11  Yes Gaylord Shih, MD  aspirin 325 MG tablet Take 325 mg by mouth daily.     Yes Historical Provider, MD  Cholecalciferol (VITAMIN D) 1000 UNITS capsule Take 1 capsule (1,000 Units total) by mouth daily. 01/05/11  Yes Gaylord Shih, MD  colchicine 0.6 MG tablet Take 0.6 mg by mouth as needed. Take one tablet by mouth daily during an acute episode of gout. 04/03/11  Yes Gaylord Shih, MD  fish oil-omega-3 fatty acids 1000 MG capsule Take 1 g by mouth daily.     Yes Historical Provider, MD  Flaxseed, Linseed, (FLAX SEED OIL) 1000 MG CAPS Take 1 capsule by mouth daily.    Yes Historical Provider, MD  furosemide (LASIX) 20 MG tablet Take 2 tablets (40 mg total) by mouth daily. 03/14/11  Yes Gaylord Shih, MD  hydrALAZINE (APRESOLINE) 50 MG tablet Take 1 tablet (50 mg total) by mouth 3 (three) times daily. 10/15/11 10/14/12 Yes Walter Macadamia, NP  levothyroxine (LEVOTHROID) 25 MCG tablet Take 1 tablet (25 mcg total) by mouth daily. 10/25/11 10/24/12 Yes Gaylord Shih, MD  LORazepam (ATIVAN) 0.5 MG tablet Take 0.5 mg by mouth every 6 (six) hours as needed. For nerves/tremors 04/26/11  Yes Pecola Lawless, MD  Multiple Vitamin (MULTIVITAMIN) tablet Take 1 tablet by mouth daily.     Yes Historical Provider, MD  nitroGLYCERIN (NITROSTAT) 0.4 MG SL tablet Place 1 tablet (0.4 mg total) under the tongue every 5 (five) minutes as needed for chest pain. 01/05/11 01/05/12 Yes Gaylord Shih, MD  omeprazole (PRILOSEC) 20 MG capsule Take 1 capsule (20 mg total) by mouth daily. 12/21/11  Yes Walter Macadamia, NP  potassium chloride SA (K-DUR,KLOR-CON) 20 MEQ tablet Take 10 mEq by mouth daily.  03/14/11  Yes Gaylord Shih, MD  propranolol (INDERAL) 10 MG tablet Take 1 tablet (10 mg total) by mouth 3 (three) times daily. 10/04/11  Yes Gaylord Shih, MD  vitamin C (ASCORBIC ACID) 500 MG tablet Take 1 tablet (500 mg total) by mouth  daily. 01/05/11  Yes Gaylord Shih, MD   Physical Exam: Filed Vitals:   12/29/11 0753  BP: 132/57  Pulse: 64  Temp: 97.8 F (36.6 C)  TempSrc: Oral  Resp: 20  SpO2: 97%     General:  Alert pleasant oriented Caucasian male, tan complexion, looking about stated age.  Eyes: no icterus, no pallor, good dentition on bottom teeth, partial replacement on upper, uvula midline, smile symmetrical  ENT: no appreciable JVD or bruit  Neck:  no thyromegaly  Cardiovascular: S1-S2 regular rate rhythm  Respiratory: clinically clear no added  Abdomen: soft nontender  Skin: scar to right upper chest-swelling over posterior right calf with tenderness in the upper gastric penis area. There is no fluctuance.  Musculoskeletal: range of motion to most of the joints in upper and lower extremities is within normal limits  Psychiatric: euthymic  Neurologic: grossly intact, motor 5/5, sensory 5/5, reflexes knee jerks are equivocal bilaterally. Extraocular movements  Labs on Admission:  Basic Metabolic Panel:  Lab 12/29/11 0981  NA 141  K 4.6  CL 106  CO2 23  GLUCOSE 146*  BUN 38*  CREATININE 1.93*  CALCIUM 9.3  MG --  PHOS --   Liver Function Tests: No results found for this basename: AST:5,ALT:5,ALKPHOS:5,BILITOT:5,PROT:5,ALBUMIN:5 in the last 168 hours No results found for this basename: LIPASE:5,AMYLASE:5 in the last 168 hours No results found for this basename: AMMONIA:5 in the last 168 hours CBC:  Lab 12/29/11 0807  WBC 3.5*  NEUTROABS 2.8  HGB 10.0*  HCT 29.7*  MCV 88.1  PLT PENDING   Cardiac Enzymes: No results found for this basename: CKTOTAL:5,CKMB:5,CKMBINDEX:5,TROPONINI:5 in the last 168 hours  BNP (last 3 results) No results found for this basename: PROBNP:3 in the last 8760 hours CBG: No results found for this basename: GLUCAP:5 in the last 168 hours  Radiological Exams on Admission: No results found.  EKG: Independently reviewed. None  performed  Assessment/Plan Principal Problem:  *Cellulitis Active Problems:  GOUT  LEUKOPENIA, CHRONIC  ACTION TREMOR  CAD, NATIVE VESSEL  Atrial fibrillation  BRADYCARDIA  GERD  POPLITEAL CYST, RIGHT  HYPERGLYCEMIA, FASTING  Chronic diastolic heart failure   1. Likely right lower extremity cellulitis-we'll continue vancomycin per pharmacy. Get a CBC in the morning with differential count. Will hopefully transition once seen lower extremity to doxycycline twice a day which he was discharged on in the past last year 10/09/2010-for the pain we will prescribe OxyIR 5 mg every 4 when necessary 2. History of porcine aortic valve repair Jan 2013-given history of falls, not a candidate for Coumadin. Continue aspirin 325 mg daily.-Noted that patient is on fish oil 1 g daily as well-there is a have to effect. Would consider discontinuation of fish oil as an outpatient 3. Reflux-continue pantoprazole 40 mg by mouth daily 4. History of chronic diastolic heart failure-Echo 03/01/2011 = EF 60% with wall motion = normal-cardiologist in the outpatient setting is aware of the abnormality on the collar signaling 5. History of gout-continue colchicine 0.6 mg by mouth daily. 6. History hypertension-continue Norvasc 5 mg daily, continue hydralazine 50 mg by mouth 3 times a day 7. History of Atrial fibrillation, CHad2vasc score 4-currently rate controlled/sinus rhythm on amiodarone 200 by mouth daily, continue propanolol 10 mg 3 times a day-not a Coumadin candidate-family unaware 8. History of hypothyroidism-continue Synthroid 25 mcg by mouth daily 9. History of fasting hypoglycemia-will cover only if blood sugars above 200 10. History of bradycardia-this is currently stable.   Code Status: full (must indicate code status--if unknown or must be presumed, indicate so) Family Communication: family at bedside (indicate person spoken with, if applicable, with phone number if by telephone) Disposition Plan:  obs overnight on telemetry-consider narrowing and Plavix in a.m. (indicate anticipated LOS)  Time spent: 67  Mahala Menghini [facility] Pager [phone]  If 7PM-7AM, please contact night-coverage [email or web address] Password [facility] 12/29/2011, 9:51 AM       \

## 2011-12-29 NOTE — Progress Notes (Signed)
*  PRELIMINARY RESULTS* Vascular Ultrasound Right lower extremity venous duplex has been completed.  Preliminary findings: Right= no evidence of DVT or baker's cyst.  Farrel Demark, RDMS, RVT  12/29/2011, 9:22 AM

## 2011-12-29 NOTE — ED Notes (Signed)
Rt. Lower leg: reddened areas marked with pen.

## 2011-12-29 NOTE — ED Notes (Signed)
Lunch tray order for pt.  

## 2011-12-29 NOTE — ED Notes (Signed)
Refuses pain medication b/c hx. Of constipation.

## 2011-12-29 NOTE — ED Notes (Signed)
Rt. Lateral side of calf pain. Warmer on rt. Side. There is redness on rt. Lateral side of under calf. Normally, the rt. Lower leg is swollen compared to left.

## 2011-12-30 LAB — GLUCOSE, CAPILLARY: Glucose-Capillary: 119 mg/dL — ABNORMAL HIGH (ref 70–99)

## 2011-12-30 MED ORDER — DOXYCYCLINE HYCLATE 100 MG PO TABS: 100.0000 mg | ORAL_TABLET | Freq: Two times a day (BID) | ORAL | Status: DC

## 2011-12-30 NOTE — Discharge Summary (Signed)
Physician Discharge Summary  [name] AVW:[id number] DOB: 1922/10/22 DOA: 12/29/2011  PCP: [name], MD  Admit date: 12/29/2011 Discharge date: 12/30/2011  Time spent: 15 minutes  Recommendations for Outpatient Follow-up:  1. Complete course of antibiotics until 01/09/2012 2. Followup with primary care physician and outpatient oncologist    Discharge Diagnoses:  Principal Problem:  *Cellulitis Active Problems:  GOUT  LEUKOPENIA, CHRONIC  ACTION TREMOR  CAD, NATIVE VESSEL  Atrial fibrillation  BRADYCARDIA  GERD  POPLITEAL CYST, RIGHT  HYPERGLYCEMIA, FASTING  Chronic diastolic heart failure   Discharge Condition: Stable  Diet recommendation: Prior  Filed Weights   12/29/11 1303 12/30/11 0600  Weight: 88.5 kg (195 lb 1.7 oz) 86.5 kg (190 lb 11.2 oz)    History of present illness:  78 y.o. male came to [facility] ed 12/29/2011 with a 1 day history of lower swelling. He states that he will awoke at 4 AM 12/29/2011 with significant pain in his right lower extremity. He admits that he also felt somewhat chilly and the fireplace and felt weak.  He came to [facility] emergency room and also felt the same way. He has no recent sick contacts, he does however get out a lot to mow the lawn, chop wood, and is as active as he can be  His wife relates that he had a recent AV valve replacement done January 2 doesn't 13 at [facility]. He has been placed on aspirin 325 mg given his propensity for falls  His last significant fall was September 2013 where he fell on a fishing trip and broke to posterior ribs on the right side.  Patient is a very active man and shut a deer 3 days ago in his backyard.  Workup in the emergency room reveals BUN/creatinine 38/1.93-his baseline is 40/2.1  WBC = 3.5, he he will been 10.2, platelets 41, neutrophilia predominant 81%, random glucose 146  Right lower extremity venous duplex = no evidence of DVT or Baker cyst.  Vital signs in emergency  room showed no specific elevation of temperature but low-grade temp of 99.  Patient was given 1 g of Vancomycin   Hospital Course:  1. Likely right lower extremity cellulitis-we'll continue vancomycin per pharmacy. Get a CBC in the morning with differential count. Patient was transitioned to doxycycline 100 twice a day on discharge home 2. Hisory of porcine aortic valve repair Jan 2013-given history of falls, not a candidate for Coumadin. Continue aspirin 325 mg daily.-Noted that patient is on fish oil 1 g daily as well-there is a have to effect. Would consider discontinuation of fish oil as an outpatient 3. Reflux-continue pantoprazole 40 mg by mouth daily 4. History of chronic diastolic heart failure-Echo 03/01/2011 = EF 60% with wall motion = normal-cardiologist in the outpatient setting is aware of the abnormality echo signal 5. History of gout-continue colchicine 0.6 mg by mouth daily. 6. History hypertension-continue Norvasc 5 mg daily, continue hydralazine 50 mg by mouth 3 times a day 7. History of Atrial fibrillation, CHad2vasc score 4-currently rate controlled/sinus rhythm on amiodarone 200 by mouth daily, continue propanolol 10 mg 3 times a day-not a Coumadin candidate-family aware 8. History of hypothyroidism-continue Synthroid 25 mcg by mouth daily 9. History of fasting hypoglycemia-will cover only if blood sugars above 200 10. History of bradycardia-this is currently stable.  Procedures:  Ultrasound lower extremity showed no Doppler issue   Consultations:  none  Discharge Exam: Filed Vitals:   12/29/11 2037 12/30/11 0547 12/30/11 0600 12/30/11 1325  BP: 114/50 103/55  115/36  Pulse: 65 57  52  Temp: 98.3 F (36.8 C) 98.7 F (37.1 C)  98.5 F (36.9 C)  TempSrc: Oral Oral  Oral  Resp: 19 19  18   Height:      Weight:   86.5 kg (190 lb 11.2 oz)   SpO2: 97% 97%  98%   awake alert feeling well no current current distress. Shortness of breath no chest pain Right lower  extremity leg pain seems much better.  General: Pleasant and oriented no acute distress Cardiovascular: S1-S2 no murmur rub or gallop Respiratory: Clear Wound demarcated looks much less inflamed than prior  Discharge Instructions  Discharge Orders    Future Appointments: Provider: Department: Dept Phone: Center:   03/04/2012 10:00 AM [facility] MEDICAL ONCOLOGY [phone] None   03/04/2012 10:30 AM Ladene Artist, MD [facility] MEDICAL ONCOLOGY [phone] None   03/19/2012 10:00 AM Rosalio Macadamia, NP [facility] Main Office [place]) [phone] [id number]     Future Orders Please Complete By Expires   Diet - low sodium heart healthy      Increase activity slowly      Call MD for:  persistant nausea and vomiting      Call MD for:  temperature >100.4      Call MD for:  redness, tenderness, or signs of infection (pain, swelling, redness, odor or green/yellow discharge around incision site)      Call MD for:  persistant dizziness or light-headedness          Medication List     As of 12/30/2011  2:14 PM    TAKE these medications         allopurinol 100 MG tablet   Commonly known as: ZYLOPRIM   Take 1 tablet (100 mg total) by mouth daily.      amiodarone 200 MG tablet   Commonly known as: PACERONE   Take 1 tablet (200 mg total) by mouth daily.      amLODipine 5 MG tablet   Commonly known as: NORVASC   Take 1 tablet (5 mg total) by mouth daily.      aspirin 325 MG tablet   Take 325 mg by mouth daily.      colchicine 0.6 MG tablet   Take 0.6 mg by mouth as needed. Take one tablet by mouth daily during an acute episode of gout.      doxycycline 100 MG tablet   Commonly known as: VIBRA-TABS   Take 1 tablet (100 mg total) by mouth 2 (two) times daily.      fish oil-omega-3 fatty acids 1000 MG capsule   Take 1 g by mouth daily.      Flax Seed Oil 1000 MG Caps   Take 1 capsule by mouth daily.      furosemide 20  MG tablet   Commonly known as: LASIX   Take 2 tablets (40 mg total) by mouth daily.      hydrALAZINE 50 MG tablet   Commonly known as: APRESOLINE   Take 1 tablet (50 mg total) by mouth 3 (three) times daily.      levothyroxine 25 MCG tablet   Commonly known as: SYNTHROID, LEVOTHROID   Take 1 tablet (25 mcg total) by mouth daily.      LORazepam 0.5 MG tablet   Commonly known as: ATIVAN   Take 0.5 mg by mouth every 6 (six) hours as needed. For nerves/tremors  multivitamin tablet   Take 1 tablet by mouth daily.      nitroGLYCERIN 0.4 MG SL tablet   Commonly known as: NITROSTAT   Place 1 tablet (0.4 mg total) under the tongue every 5 (five) minutes as needed for chest pain.      omeprazole 20 MG capsule   Commonly known as: PRILOSEC   Take 1 capsule (20 mg total) by mouth daily.      potassium chloride SA 20 MEQ tablet   Commonly known as: K-DUR,KLOR-CON   Take 10 mEq by mouth daily.      propranolol 10 MG tablet   Commonly known as: INDERAL   Take 1 tablet (10 mg total) by mouth 3 (three) times daily.      vitamin C 500 MG tablet   Commonly known as: ASCORBIC ACID   Take 1 tablet (500 mg total) by mouth daily.      Vitamin D 1000 UNITS capsule   Take 1 capsule (1,000 Units total) by mouth daily.          The results of significant diagnostics from this hospitalization (including imaging, microbiology, ancillary and laboratory) are listed below for reference.    Significant Diagnostic Studies: No results found.  Microbiology: No results found for this or any previous visit (from the past 240 hour(s)).   Labs: Basic Metabolic Panel:  Lab 12/29/11 1610  NA 141  K 4.6  CL 106  CO2 23  GLUCOSE 146*  BUN 38*  CREATININE 1.93*  CALCIUM 9.3  MG --  PHOS --   Liver Function Tests: No results found for this basename: AST:5,ALT:5,ALKPHOS:5,BILITOT:5,PROT:5,ALBUMIN:5 in the last 168 hours No results found for this basename: LIPASE:5,AMYLASE:5 in the last  168 hours No results found for this basename: AMMONIA:5 in the last 168 hours CBC:  Lab 12/29/11 1336 12/29/11 0807  WBC 4.3 3.5*  NEUTROABS 3.8 2.8  HGB 10.4* 10.0*  HCT 30.4* 29.7*  MCV 88.4 88.1  PLT 47* 41*   Cardiac Enzymes: No results found for this basename: CKTOTAL:5,CKMB:5,CKMBINDEX:5,TROPONINI:5 in the last 168 hours BNP: BNP (last 3 results) No results found for this basename: PROBNP:3 in the last 8760 hours CBG:  Lab 12/30/11 0738  GLUCAP 119*       Signed:  Rhetta Mura  [facility] 12/30/2011, 2:14 PM

## 2011-12-31 ENCOUNTER — Telehealth: Payer: Self-pay | Admitting: Internal Medicine

## 2011-12-31 NOTE — Telephone Encounter (Signed)
Patient was seen in ER, per Dr.Hopper's protocol if sent to ER or Urgent Care ok to close encounter

## 2011-12-31 NOTE — Progress Notes (Signed)
Utilization Review Completed.   Dera Vanaken, RN, BSN Nurse Case Manager  [phone]  

## 2011-12-31 NOTE — Telephone Encounter (Signed)
[facility] Triage Call Report Triage Record Num: [id number] Operator: Chevis Pretty Patient Name: [name] Call Date & Time: 12/29/2011 6:53:07AM Patient Phone: [phone] PCP: [name] Patient Gender: Male PCP Fax : [phone] Patient DOB: 1922-01-19 Practice Name: [facility] Reason for Call: Caller: Cathleen/Spouse; PCP: [name]; CB#: [phone]; Call regarding Right leg swollen with pain; onset of right-sided calf swelling below the knee 12/29/11 0400. Has had history of cellulitis. No wound area or specific redness. Had heart valve replaced 01/23/11. States pain is worse when standing, and calf is warm to touch. Per protocol, advised ED; states will take to [facility] ED. Protocol(s) Used: Leg Non-Injury Recommended Outcome per Protocol: See ED Immediately Reason for Outcome: Recent onset of one-sided pain, tenderness, or aching that may worsen with standing or walking OR a cord-like swelling that may feel warm, look red or discolored. Care Advice: ~ Another adult should drive. ~ Call EMS [phone] if chest pain or shortness of breath develops; or begins coughing or spitting up blood. ~ Do not massage affected area if red, swollen, warm, or tender to touch OR if history of blood clots. ~ Elevate part to improve circulation and decrease discomfort. ~ IMMEDIATE ACTION Write down provider's name. List or place the following in a bag for transport with the patient: current prescription and/or nonprescription medications; alternative treatments, therapies and medications; and street drugs. ~

## 2012-01-04 ENCOUNTER — Telehealth: Payer: Self-pay | Admitting: Cardiology

## 2012-01-04 MED ORDER — HYDRALAZINE HCL 50 MG PO TABS: 50.0000 mg | ORAL_TABLET | Freq: Three times a day (TID) | ORAL | Status: DC

## 2012-01-04 NOTE — Telephone Encounter (Signed)
plz return call to pt [phone] at hm# regarding mail order medication.

## 2012-01-21 ENCOUNTER — Encounter: Payer: Self-pay | Admitting: Internal Medicine

## 2012-01-21 ENCOUNTER — Ambulatory Visit (INDEPENDENT_AMBULATORY_CARE_PROVIDER_SITE_OTHER): Payer: Medicare Other | Admitting: Internal Medicine

## 2012-01-21 ENCOUNTER — Telehealth: Payer: Self-pay | Admitting: Internal Medicine

## 2012-01-21 ENCOUNTER — Telehealth: Payer: Self-pay | Admitting: Cardiology

## 2012-01-21 VITALS — BP 160/68 | HR 67 | Temp 98.1°F | Wt 199.0 lb

## 2012-01-21 DIAGNOSIS — M109 Gout, unspecified: Secondary | ICD-10-CM

## 2012-01-21 MED ORDER — PREDNISONE 20 MG PO TABS: ORAL_TABLET | ORAL | Status: DC

## 2012-01-21 NOTE — Progress Notes (Signed)
  Subjective:    Patient ID: [name], male    DOB: 04-16-1922, 79 y.o.   MRN: [id number]  HPI Extremity pain Location: R foot @base  of great toe with joint redness & swelling Onset: 01/20/12 Trigger/injury:no Pain quality:sharp Pain severity: up to 7 Duration:constant Radiation:no Exacerbating factors: weight bearing ; wearing shoe Treatment/response: maintenance meds only     Review of Systems Constitutional: no fever, chills, sweats. Weight  Up , ?from fluid retention Musculoskeletal:no  muscle cramps or pain. Neuro: no weakness; numbness and tingling Heme:no lymphadenopathy        Objective:   Physical Exam   He is uncomfortable due to pain at the base of the right great toe but in no acute distress  The dorsum of the right foot is visibly swollen. He has tense edema of the left lower extremity; support hose worn bilaterally.  Pedal pulses are excellent.  There is exquisite tenderness to touch at the base of the right great toe medially.  Hyperpigmentation is noted of the shins and the dorsum of the forearms and hands        Assessment & Plan:

## 2012-01-21 NOTE — Telephone Encounter (Signed)
Patient called, he stated he has gout in rt foot.Patient advised to call PCP.

## 2012-01-21 NOTE — Patient Instructions (Addendum)
Do NOT increase Allopurinol 100 mg daily. Increase the colchicine to twice a day until the acute gout is gone. Complete the prescription for prednisone. Please review the medication list in the After Visit Summary provided.Please verify the medication name (this may be  brand or generic) & correct dosage. Write the name of the prescribing physician to the right of the medication and share this with all medical staff seen at each appointment. This will help provide continuity of care; help optimize therapeutic interventions;and help prevent drug:drug adverse reaction.

## 2012-01-21 NOTE — Assessment & Plan Note (Signed)
Colchicine will be increased to twice a day until the acute gouty attack has resolved. Abbreviated course of prednisone will be initiated. Uric acid will be checked.  Rest and elevation recommended

## 2012-01-21 NOTE — Telephone Encounter (Signed)
Patient called phone rang busy.

## 2012-01-21 NOTE — Telephone Encounter (Signed)
Patient Information:  Caller Name: Maze  Phone: [phone]  Patient: [name], [name]  Gender: Male  DOB: June 30, 1922  Age: 79 Years  PCP: [name]  Office Follow Up:  Does the office need to follow up with this patient?: No  Instructions For The Office: N/A  RN Note:  Pt has Gout, he is taking his Allopurinol rates the pain at a 7.  Symptoms  Reason For Call & Symptoms: right  big toe swelling and pain  Reviewed Health History In EMR: Yes  Reviewed Medications In EMR: Yes  Reviewed Allergies In EMR: Yes  Reviewed Surgeries / Procedures: Yes  Date of Onset of Symptoms: 01/20/2012  Treatments Tried: Allopurinol  Treatments Tried Worked: No  Guideline(s) Used:  Toe Pain  Disposition Per Guideline:   See Within 3 Days in Office  Reason For Disposition Reached:   Moderate pain (e.g., interferes with normal activities, limping) and present > 3 days  Advice Given:  Pain Medicines:  For pain relief, you can take either acetaminophen, ibuprofen, or naproxen.  Call Back If:  You become worse.  Appointment Scheduled:  01/21/2012 15:15:00 doc of the afternoon, in slot Appointment Scheduled Provider:  [name]

## 2012-01-21 NOTE — Telephone Encounter (Signed)
New Problem:    Patient called in because he has the gout in the big toe on his right foot and was left a message with his PCP for them to give him a call back at 9:00am.  Patient still has not heard form them and wanted to speak with someone here.  Please call back.

## 2012-01-22 LAB — URIC ACID: Uric Acid, Serum: 6.9 mg/dL (ref 4.0–7.8)

## 2012-01-31 ENCOUNTER — Encounter: Payer: Self-pay | Admitting: Internal Medicine

## 2012-01-31 ENCOUNTER — Ambulatory Visit (INDEPENDENT_AMBULATORY_CARE_PROVIDER_SITE_OTHER): Payer: Medicare Other | Admitting: Internal Medicine

## 2012-01-31 VITALS — BP 138/60 | HR 57 | Temp 97.7°F | Wt 197.0 lb

## 2012-01-31 DIAGNOSIS — B029 Zoster without complications: Secondary | ICD-10-CM

## 2012-01-31 DIAGNOSIS — D61818 Other pancytopenia: Secondary | ICD-10-CM

## 2012-01-31 MED ORDER — GABAPENTIN 100 MG PO CAPS: ORAL_CAPSULE | ORAL | Status: DC

## 2012-01-31 MED ORDER — FAMCICLOVIR 500 MG PO TABS: 500.0000 mg | ORAL_TABLET | Freq: Three times a day (TID) | ORAL | Status: DC

## 2012-01-31 NOTE — Progress Notes (Signed)
  Subjective:    Patient ID: [name], male    DOB: February 28, 1922, 79 y.o.   MRN: [id number]  HPI RASH: Location: Started on Rt. Upper shoulder (C6)  Onset: 4-5 days   Course: pain only with movement Self-treated with: antibiotic ointment             Improvement with treatment: none   History Pruritis: no Tenderness: yes with movement New medications/antibiotics: no Tick/insect/pet exposure:No Recent travel: No New detergent, new clothing, or other topical exposure:No                                                                            Review of Systems Feeling ill: No Fever: No Mouth lesions: no Facial/tongue swelling/difficulty breathing: no Diabetes contolled.Possible  Immunocompromised in setting of pancytopenia.Monitored by Dr. [name]. He had told patient not to take the shingles vaccine     Objective:   Physical Exam Gen.:  well-nourished in appearance. Alert, appropriate and cooperative throughout exam.   Eyes: No corneal or conjunctival inflammation noted. No icterus  Mouth: Oral mucosa and oropharynx reveal no lesions or exudates. Teeth in good repair. Neck: No deformities, masses, or tenderness noted.  Musculoskeletal/extremities:  No clubbing or cyanosis. 1+ edema noted.   Neurologic: Alert and oriented x3.         Skin: Hyperpigmentation & ecchymoses of forearms. Classic herpes zoster in C.-6 dermatome left upper back Lymph: No cervical, axillary lymphadenopathy present. Psych: Mood and affect are normal. Normally interactive                    Assessment & Plan:  #1 C-6 herpes zoster with some neuropathic pain  Plan: See orders and recommendations

## 2012-01-31 NOTE — Patient Instructions (Addendum)
Review and correct the record as indicated. Please share record with all medical staff seen.  Please review the medication list in the After Visit Summary provided.Please verify the medication name (this may be  brand or generic) & correct dosage. Write the name of the prescribing physician to the right of the medication and share this with all medical staff seen at each appointment. This will help provide continuity of care; help optimize therapeutic interventions;and help prevent drug:drug adverse reaction.

## 2012-02-13 ENCOUNTER — Telehealth: Payer: Self-pay | Admitting: Cardiology

## 2012-02-13 NOTE — Telephone Encounter (Signed)
New Problem    Pt c/o of severe swelling in legs and full of fluid. Retaining a lot of fluid and not eating much. Would like to speak to nurse.

## 2012-02-13 NOTE — Telephone Encounter (Signed)
Pt calls increasing edema over the past week. States he took " 60mg  of lasix for several days last week... But it doesn't seem to be working". Pt took 60mg  this morning.  Weight today: 196  States edema is worse in his legs and he has had some shortness of breath. " but my wife thinks it might be bronchitis"  Okey Regal has made appt for 3:40pm with Tereso Newcomer tomorrow.  Pt aware. Mylo Red RN

## 2012-02-14 ENCOUNTER — Ambulatory Visit (INDEPENDENT_AMBULATORY_CARE_PROVIDER_SITE_OTHER): Payer: Medicare Other | Admitting: Physician Assistant

## 2012-02-14 VITALS — BP 142/50 | HR 50 | Ht 70.0 in | Wt 200.6 lb

## 2012-02-14 DIAGNOSIS — I359 Nonrheumatic aortic valve disorder, unspecified: Secondary | ICD-10-CM

## 2012-02-14 DIAGNOSIS — N189 Chronic kidney disease, unspecified: Secondary | ICD-10-CM

## 2012-02-14 DIAGNOSIS — I10 Essential (primary) hypertension: Secondary | ICD-10-CM

## 2012-02-14 DIAGNOSIS — R0609 Other forms of dyspnea: Secondary | ICD-10-CM

## 2012-02-14 DIAGNOSIS — I4891 Unspecified atrial fibrillation: Secondary | ICD-10-CM

## 2012-02-14 DIAGNOSIS — I5032 Chronic diastolic (congestive) heart failure: Secondary | ICD-10-CM

## 2012-02-14 DIAGNOSIS — R609 Edema, unspecified: Secondary | ICD-10-CM

## 2012-02-14 NOTE — Progress Notes (Signed)
[address] [place], [place]  [address] Phone: [phone], Fax:  [phone]  Date:  02/14/2012   ID:  [name], DOB 02-01-1922, MRN [id number]  PCP:  [name], MD  Primary Cardiologist:  Dr. Valera Castle     History of Present Illness: [name] is a 79 y.o. male who returns for the evaluation of edema and shortness of breath.  He has a h/o persistent atrial fibrillation, maintaining NSR on amiodarone, no longer on coumadin (due to thrombocytopenia), severe AS, HTN, DM2, CKD, hypothyroidism, GERD.  He underwent tissue AVR by a right thoracotomy approach with Dr. Silvestre Mesi 01/23/11. Post op course notable for a/c renal failure (peak creatinine of 2).  Echo 2/13: Moderate LVH, EF 60%, AVR okay, moderate LAE, mild RVE, moderate RAE, mild to moderate TR, PASP 42. He has had problems with paroxysmal atrial fibrillation with adjustments made in his amiodarone returning to sinus rhythm over the last several months. Last seen in this office 12/13. Patient was admitted to the hospital with lower extremity cellulitis in December. Over the last several months, he has noted worsening LE edema. Over the last week he has noted dyspnea with exertion. He describes NYHA class II-IIb symptoms. He denies orthopnea, PND. He denies chest pain, syncope, near syncope. He does note a recent cough. This is productive of clear sputum. He denies wheezing. He denies fevers or chills, sore throat or congestion. His wife does have bronchitis.    Labs (8/13):    K 4.7, creatinine 1.9 Labs (9/13):    ALT 24 Labs (12/13):  K 4.6, creatinine 1.93, TSH 5.08  Wt Readings from Last 3 Encounters:  02/14/12 200 lb 9.6 oz (90.992 kg)  01/31/12 197 lb (89.359 kg)  01/21/12 199 lb (90.266 kg)     Past Medical History  Diagnosis Date  . Stroke   . Atrial fibrillation     amiodarone therapy; not a candidate for anticoagulation  . Bradycardia   . Aortic stenosis     echo 10/12: EF 65-70%,  grade 2 diast dysfxn, severe AS, mean gradient 60 mmHg, mod LAE, PASP 47;  s/p minimally invasive tissue AVR with Dr. Silvestre Mesi at [facility] 01/2011 (pre-AVR cath with no obs CAD)  . Hypertension   . Diabetes mellitus   . Anemia   . Action tremor   . Thrombocytopenia     Dr. [name]  . Throat cancer     s/p resection  . Colon polyp   . Anxiety   . Splenomegaly   . Gout   . GERD (gastroesophageal reflux disease)   . Hypothyroidism   . Leukopenia     Chronic pancytopenia  . Degenerative disc disease   . Joint effusion, knee     left knee  . Synovial cyst of popliteal space   . Cellulitis of left leg 10/11-16/2012  . CKD (chronic kidney disease)   . CHF (congestive heart failure)   . Heart murmur     Current Outpatient Prescriptions  Medication Sig Dispense Refill  . allopurinol (ZYLOPRIM) 100 MG tablet Take 1 tablet (100 mg total) by mouth daily.  90 tablet  3  . amiodarone (PACERONE) 200 MG tablet Take 1 tablet (200 mg total) by mouth daily.      [name] Kitchen amLODipine (NORVASC) 5 MG tablet Take 1 tablet (5 mg total) by mouth daily.  90 tablet  3  . aspirin 325 MG tablet Take 325 mg by mouth daily.        [name] Kitchen  Cholecalciferol (VITAMIN D) 1000 UNITS capsule Take 1 capsule (1,000 Units total) by mouth daily.  90 capsule  3  . colchicine 0.6 MG tablet Take 0.6 mg by mouth as needed. Take one tablet by mouth daily during an acute episode of gout.      . fish oil-omega-3 fatty acids 1000 MG capsule Take 1 g by mouth daily.        . Flaxseed, Linseed, (FLAX SEED OIL) 1000 MG CAPS Take 1 capsule by mouth daily.       . furosemide (LASIX) 20 MG tablet Take 2 tablets (40 mg total) by mouth daily.  180 tablet  3  . gabapentin (NEURONTIN) 100 MG capsule One pill every eight hours as needed; dose may be increased by one pill each dose after 72 hours if only partially effective  30 capsule  2  . hydrALAZINE (APRESOLINE) 50 MG tablet Take 1 tablet (50 mg total) by mouth 3 (three) times daily.  270 tablet  3  .  levothyroxine (LEVOTHROID) 25 MCG tablet Take 1 tablet (25 mcg total) by mouth daily.  90 tablet  3  . LORazepam (ATIVAN) 0.5 MG tablet Take 0.5 mg by mouth every 6 (six) hours as needed. For nerves/tremors      . Multiple Vitamin (MULTIVITAMIN) tablet Take 1 tablet by mouth daily.        [name] Kitchen omeprazole (PRILOSEC) 20 MG capsule Take 1 capsule (20 mg total) by mouth daily.  90 capsule  1  . potassium chloride SA (K-DUR,KLOR-CON) 20 MEQ tablet Take 10 mEq by mouth daily.       . propranolol (INDERAL) 10 MG tablet Take 1 tablet (10 mg total) by mouth 3 (three) times daily.  270 tablet  3  . vitamin C (ASCORBIC ACID) 500 MG tablet Take 1 tablet (500 mg total) by mouth daily.  90 tablet  3  . nitroGLYCERIN (NITROSTAT) 0.4 MG SL tablet Place 1 tablet (0.4 mg total) under the tongue every 5 (five) minutes as needed for chest pain.  25 tablet  6    Allergies:    Allergies  Allergen Reactions  . Penicillins     Does not remember reaction (~year 1950)  . Neomycin-Bacitracin Zn-Polymyx     ? Reaction (thinks he remembers redness)    Social History:  The patient  reports that he quit smoking about 50 years ago. He does not have any smokeless tobacco history on file. He reports that he does not drink alcohol or use illicit drugs.   ROS:  Please see the history of present illness.    All other systems reviewed and negative.   PHYSICAL EXAM: VS:  BP 142/50  Pulse 50  Ht 5\' 10"  (1.778 m)  Wt 200 lb 9.6 oz (90.992 kg)  BMI 28.78 kg/m2 Well nourished, well developed, in no acute distress HEENT: normal Neck: no JVD Cardiac:  normal S1, S2; RRR;  2/6 systolic murmur heard best at the RUSB  Lungs:  clear to auscultation bilaterally, no wheezing, rhonchi or rales Abd: soft, nontender, no hepatomegaly Ext:  1-2+ bilateral LE edema with chronic stasis changes  Skin: warm and dry Neuro:  CNs 2-12 intact, no focal abnormalities noted  EKG:  Sinus bradycardia, HR 50, left axis deviation, no change from  prior tracing     ASSESSMENT AND PLAN:  1. Dyspnea:  His lungs are clear. He does have a history of chronic diastolic CHF. He does have LE edema. His weights are minimally elevated.  I suspect that most of his dyspnea is related to his recent illness. This is likely viral. I have recommended conservative measures. He will contact us if he develops fevers or purulent sputum. I will check a basic metabolic panel and BNP today.  2. Edema: This is likely related more to venous insufficiency than anything else. I have given him a prescription for bilateral knee-high compression stockings. I have recommended that he keep his legs elevated as much as possible. I will increase his Lasix to 40 mg twice a day for 5 days. He will then resume 60 mg daily. Check a basic metabolic panel today and repeat a basic metabolic panel in 10 days. 3. Chronic Diastolic CHF: As noted, I suspect his edema is more related to venous insufficiency. Check a BNP today. If this is significantly elevated, continue higher dose Lasix for longer.  We discussed limiting his salt intake. 4. Aortic Stenosis, status post AVR:  Overall stable. 5. Atrial Fibrillation: Maintaining sinus rhythm. He is not a Coumadin candidate. 6. Hypertension: Controlled. 7. Chronic Kidney Disease: I will keep a close eye on his renal function and potassium with adjustments in his diuretics as noted. 8. Disposition: He will keep his followup appointment next month as planned.  Signed, Tereso Newcomer, PA-C  4:55 PM 02/14/2012

## 2012-02-14 NOTE — Patient Instructions (Addendum)
Your physician has recommended you make the following change in your medication: INCREASE your LASIX (FUROSEMIDE) to 40 mg twice daily x 5 days.  Then resume Lasix 60 mg daily  Your physician recommends that you return for lab work in: today (bmet) and in 10 days (bmet)  Your physician recommends that you schedule a follow-up appointment in: March with Norma Fredrickson  Get some knee high compression stockings and wear them during the day (prescription given)

## 2012-02-15 LAB — BASIC METABOLIC PANEL
Creatinine, Ser: 1.8 mg/dL — ABNORMAL HIGH (ref 0.4–1.5)
Potassium: 4 mEq/L (ref 3.5–5.1)
Sodium: 139 mEq/L (ref 135–145)

## 2012-02-18 ENCOUNTER — Telehealth: Payer: Self-pay | Admitting: *Deleted

## 2012-02-18 NOTE — Telephone Encounter (Signed)
Message copied by Tarri Fuller on Mon Feb 18, 2012  8:56 AM ------      Message from: [place], [place] T      Created: Sun Feb 17, 2012  9:33 PM       Creatinine stable      BNP minimally elevated      Continue with current treatment plan.      Tereso Newcomer, PA-C  9:33 PM 02/17/2012 ------

## 2012-02-18 NOTE — Telephone Encounter (Signed)
pt notified about lab results and states he is going to come in 2/13 for bmet instead of 2/17 due to will be leaving for [facility] 2/14 am and will be gone for 10-12 days. pt verbalized understanding to lab results. I will advise PA lab earlier on 02/21/12

## 2012-02-21 ENCOUNTER — Other Ambulatory Visit: Payer: Medicare Other

## 2012-02-25 ENCOUNTER — Other Ambulatory Visit: Payer: Medicare Other

## 2012-03-04 ENCOUNTER — Other Ambulatory Visit (HOSPITAL_BASED_OUTPATIENT_CLINIC_OR_DEPARTMENT_OTHER): Payer: Medicare Other | Admitting: Lab

## 2012-03-04 ENCOUNTER — Telehealth: Payer: Self-pay | Admitting: Oncology

## 2012-03-04 ENCOUNTER — Ambulatory Visit (HOSPITAL_BASED_OUTPATIENT_CLINIC_OR_DEPARTMENT_OTHER): Payer: Medicare Other | Admitting: Oncology

## 2012-03-04 VITALS — BP 164/45 | HR 50 | Temp 97.1°F | Resp 20 | Ht 70.0 in | Wt 195.0 lb

## 2012-03-04 DIAGNOSIS — D61818 Other pancytopenia: Secondary | ICD-10-CM

## 2012-03-04 LAB — CBC WITH DIFFERENTIAL/PLATELET
Basophils Absolute: 0 10*3/uL (ref 0.0–0.1)
Eosinophils Absolute: 0.1 10*3/uL (ref 0.0–0.5)
LYMPH%: 16 % (ref 14.0–49.0)
MCH: 30.4 pg (ref 27.2–33.4)
MCV: 89.5 fL (ref 79.3–98.0)
MONO%: 4.9 % (ref 0.0–14.0)
NEUT#: 2.8 10*3/uL (ref 1.5–6.5)
Platelets: 49 10*3/uL — ABNORMAL LOW (ref 140–400)
RBC: 3.6 10*6/uL — ABNORMAL LOW (ref 4.20–5.82)

## 2012-03-04 NOTE — Telephone Encounter (Signed)
gv and printed appt schedule for Aug °

## 2012-03-04 NOTE — Progress Notes (Signed)
   [facility]    OFFICE PROGRESS NOTE   INTERVAL HISTORY:   He returns as scheduled. He had a recent "cold ". He continues to bruise easily. No other bleeding. No other complaint. He had a recent zoster rash near the left shoulder, treated by Dr. Alwyn Ren.  Objective:  Vital signs in last 24 hours:  Blood pressure 164/45, pulse 50, temperature 97.1 F (36.2 C), temperature source Oral, resp. rate 20, height 5\' 10"  (1.778 m), weight 195 lb (88.451 kg).    HEENT: Neck without mass Lymphatics: No cervical, supraclavicular, axillary, or inguinal nodes Resp: Lungs clear bilaterally Cardio: Regular rate and rhythm, 2/6 systolic and diastolic murmurs GI: No hepatomegaly Vascular: No leg edema   Lab Results:  Lab Results  Component Value Date   WBC 3.7* 03/04/2012   HGB 11.0* 03/04/2012   HCT 32.3* 03/04/2012   MCV 89.5 03/04/2012   PLT 49* 03/04/2012   ANC 2.8   Medications: I have reviewed the patient's current medications.  Assessment/Plan: 1. Chronic pancytopenia-the thrombocytopenia responded to Nplate therapy prior to aortic valve replacement surgery 2. History of Splenomegaly.  3. History of atrial fibrillation.  4. Admission with a left lower extremity cellulitis in June 2009.  5. "Hot flashes," etiology unclear.  6. Diabetes.  7. Severe aortic stenosis-he underwent aortic valve replacement surgery at [facility] on 01/23/2011 8. 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  9. Episode of gout at the right great toe following the aortic valve replacement surgery    Disposition:  He is stable from a hematologic standpoint. He will contact us for bleeding.  [name] will return for an office visit and CBC in 6 months.   Thornton Papas, MD  03/04/2012  6:00 PM

## 2012-03-19 ENCOUNTER — Ambulatory Visit (INDEPENDENT_AMBULATORY_CARE_PROVIDER_SITE_OTHER): Payer: Medicare Other | Admitting: Nurse Practitioner

## 2012-03-19 ENCOUNTER — Encounter: Payer: Self-pay | Admitting: Nurse Practitioner

## 2012-03-19 VITALS — BP 134/50 | HR 52 | Ht 70.0 in | Wt 196.0 lb

## 2012-03-19 DIAGNOSIS — I5032 Chronic diastolic (congestive) heart failure: Secondary | ICD-10-CM

## 2012-03-19 LAB — BASIC METABOLIC PANEL
BUN: 43 mg/dL — ABNORMAL HIGH (ref 6–23)
CO2: 26 mEq/L (ref 19–32)
Calcium: 8.8 mg/dL (ref 8.4–10.5)
Chloride: 108 mEq/L (ref 96–112)
Creatinine, Ser: 2.2 mg/dL — ABNORMAL HIGH (ref 0.4–1.5)
GFR: 30.17 mL/min — ABNORMAL LOW (ref >60.00)
Glucose, Bld: 98 mg/dL (ref 70–99)
Potassium: 4.4 mEq/L (ref 3.5–5.1)
Sodium: 140 mEq/L (ref 135–145)

## 2012-03-19 MED ORDER — AMIODARONE HCL 200 MG PO TABS: 200.0000 mg | ORAL_TABLET | Freq: Every day | ORAL | Status: DC

## 2012-03-19 NOTE — Progress Notes (Signed)
[name] Date of Birth: 05-07-1922 Medical Record #[id number]  History of Present Illness: [name] is seen back today for a one month check. This is a regular follow up visit. He is seen for Dr. Daleen Squibb. He has a history of minimally invasive AVR by Dr. Silvestre Mesi at [facility] in January of 2013. Follow up echo done in February of 2013. Echo 2/13: Moderate LVH, EF 60%, AVR okay, moderate LAE, mild RVE, moderate RAE, mild to moderate TR, PASP 42. Has PAF and is on chronic amiodarone. Not a candidate for coumadin due to anemia, thrombocytopenia and falls. Other issues include HTN (hard to control), DM, tremor, prior throat cancer, gout, GERD, hypothyroidism, CKD and DJD. He has had a tendency towards bradycardia and has not been able to tolerate Cardizem. He has chronic venous insufficiency with chronic edema.   He was seen a month ago for a work in visit by Tereso Newcomer, PA. He was seen for edema and shortness of breath. These are chronic complaints. His diuretics were increased for a short course. Has been seen by Dr. [name] last month and felt to be stable from a heme standpoint.   He comes back today. He is here with his wife. He is doing ok. Planning on going to [place] tomorrow for a funeral. Breathing is ok. Swelling is a little better. Wearing his support stockings. He has stayed on the higher dose of Lasix - need to recheck labs today. No chest pain. Rhythm has been good. More active over the last several days. Actually been using a chain saw and a block of wood did hit his leg yesterday and he had bleeding. He was able to get it stopped. BP has been ok. Has had some nasal/ear congestion over the last several weeks that has been slow to dissipate. No fever or chills. No cough. Overall, he seems to be holding his own.   Current Outpatient Prescriptions on File Prior to Visit  Medication Sig Dispense Refill  . allopurinol (ZYLOPRIM) 100 MG tablet Take 1 tablet (100 mg total) by mouth daily.  90 tablet  3   . amLODipine (NORVASC) 5 MG tablet Take 1 tablet (5 mg total) by mouth daily.  90 tablet  3  . aspirin 325 MG tablet Take 325 mg by mouth daily.        . Cholecalciferol (VITAMIN D) 1000 UNITS capsule Take 1 capsule (1,000 Units total) by mouth daily.  90 capsule  3  . colchicine 0.6 MG tablet Take 0.6 mg by mouth as needed. Take one tablet by mouth daily during an acute episode of gout.      . fish oil-omega-3 fatty acids 1000 MG capsule Take 1 g by mouth daily.        . Flaxseed, Linseed, (FLAX SEED OIL) 1000 MG CAPS Take 1 capsule by mouth daily.       [name] Kitchen gabapentin (NEURONTIN) 100 MG capsule One pill every eight hours as needed; dose may be increased by one pill each dose after 72 hours if only partially effective  30 capsule  2  . hydrALAZINE (APRESOLINE) 50 MG tablet Take 1 tablet (50 mg total) by mouth 3 (three) times daily.  270 tablet  3  . levothyroxine (LEVOTHROID) 25 MCG tablet Take 1 tablet (25 mcg total) by mouth daily.  90 tablet  3  . LORazepam (ATIVAN) 0.5 MG tablet Take 0.5 mg by mouth every 6 (six) hours as needed. For nerves/tremors      .  Multiple Vitamin (MULTIVITAMIN) tablet Take 1 tablet by mouth daily.        . nitroGLYCERIN (NITROSTAT) 0.4 MG SL tablet Place 1 tablet (0.4 mg total) under the tongue every 5 (five) minutes as needed for chest pain.  25 tablet  6  . omeprazole (PRILOSEC) 20 MG capsule Take 1 capsule (20 mg total) by mouth daily.  90 capsule  1  . potassium chloride SA (K-DUR,KLOR-CON) 20 MEQ tablet Take 10 mEq by mouth daily.       . propranolol (INDERAL) 10 MG tablet Take 1 tablet (10 mg total) by mouth 3 (three) times daily.  270 tablet  3  . vitamin C (ASCORBIC ACID) 500 MG tablet Take 1 tablet (500 mg total) by mouth daily.  90 tablet  3   No current facility-administered medications on file prior to visit.    Allergies  Allergen Reactions  . Penicillins     Does not remember reaction (~year 1950)  . Neomycin-Bacitracin Zn-Polymyx     ? Reaction  (thinks he remembers redness)    Past Medical History  Diagnosis Date  . Stroke   . Atrial fibrillation     amiodarone therapy; not a candidate for anticoagulation  . Bradycardia   . Aortic stenosis     echo 10/12: EF 65-70%, grade 2 diast dysfxn, severe AS, mean gradient 60 mmHg, mod LAE, PASP 47;  s/p minimally invasive tissue AVR with Dr. Silvestre Mesi at [facility] 01/2011 (pre-AVR cath with no obs CAD)  . Hypertension   . Diabetes mellitus   . Anemia   . Action tremor   . Thrombocytopenia     Dr. [name]  . Throat cancer     s/p resection  . Colon polyp   . Anxiety   . Splenomegaly   . Gout   . GERD (gastroesophageal reflux disease)   . Hypothyroidism   . Leukopenia     Chronic pancytopenia  . Degenerative disc disease   . Joint effusion, knee     left knee  . Synovial cyst of popliteal space   . Cellulitis of left leg 10/11-16/2012  . CKD (chronic kidney disease)   . CHF (congestive heart failure)   . Heart murmur     Past Surgical History  Procedure Laterality Date  . Cholecystectomy    . Joint effusion      left knee  . Aortic valve replacement  01/23/2011    via minimally invasive approach per Dr Silvestre Mesi, [facility]    History  Smoking status  . Former Smoker  . Quit date: 01/11/1962  Smokeless tobacco  . Not on file    History  Alcohol Use No    Family History  Problem Relation Age of Onset  . Colon cancer    . Stroke    . Cancer Other     colon  . Stroke Other     Review of Systems: The review of systems is per the HPI.  Does tend to bruise very easily. All other systems were reviewed and are negative.  Physical Exam: BP 134/50  Pulse 52  Ht 5\' 10"  (1.778 m)  Wt 196 lb (88.905 kg)  BMI 28.12 kg/m2 Patient is an elderly male who is chronically ill but pleasant and in no acute distress. Skin is warm and dry. Arms are very ecchymotic. Color is normal.  HEENT is unremarkable. Normocephalic/atraumatic. PERRL. Sclera are nonicteric. Neck is supple. No  masses. No JVD. Lungs are clear. Cardiac exam shows a regular  rate and rhythm. Outflow murmur noted. Abdomen is soft. Extremities are with 1+ edema. Support stockings in place. Gait and ROM are intact. No gross neurologic deficits noted.  LABORATORY DATA: BMET is pending  Lab Results  Component Value Date   WBC 3.7* 03/04/2012   HGB 11.0* 03/04/2012   HCT 32.3* 03/04/2012   PLT 49* 03/04/2012   GLUCOSE 81 02/14/2012   CHOL 78 09/19/2011   TRIG 65.0 09/19/2011   HDL 35.60* 09/19/2011   LDLCALC 29 09/19/2011   ALT 24 09/19/2011   AST 28 09/19/2011   NA 139 02/14/2012   K 4.0 02/14/2012   CL 106 02/14/2012   CREATININE 1.8* 02/14/2012   BUN 25* 02/14/2012   CO2 26 02/14/2012   TSH 5.08 12/21/2011   INR 1.27 10/20/2010   HGBA1C 5.4 09/19/2011   Assessment / Plan:  1. Diastolic heart failure - normal EF at 60%. Will need to recheck a BMET today.   2. Chronic venous insufficiency - swelling is better. I suspect he still gets too much salt and really does not elevate his legs much. Continue support stockings. He has continued the higher dose of Lasix. Recheck BMET today.   3. PAF - on amiodarone and not a candidate for coumadin given his thrombocytopenia and prior fall history  4. Prior AVR for severe AS - presumed perivalvular leak - I do not see where a repeat echo would really change what we are doing for him.   5. CKD - rechecking today  6. HTN - BP looks ok.   Will get him back to see Dr. Daleen Squibb in June.   Patient is agreeable to this plan and will call if any problems develop in the interim.   Rosalio Macadamia, RN, ANP-C [facility] [address] [place], [place]  [address]

## 2012-03-19 NOTE — Patient Instructions (Addendum)
Stay active  Try to keep elevating your legs  Stay on your current medicines  We will check labs today  Dr. Daleen Squibb will see you in 3 months  Touch base with Dr. Alwyn Ren if your sugars run high  Call the [facility] office at [phone] if you have any questions, problems or concerns.

## 2012-03-21 ENCOUNTER — Ambulatory Visit: Payer: Medicare Other | Admitting: Nurse Practitioner

## 2012-04-01 ENCOUNTER — Encounter: Payer: Self-pay | Admitting: Internal Medicine

## 2012-04-01 ENCOUNTER — Ambulatory Visit (INDEPENDENT_AMBULATORY_CARE_PROVIDER_SITE_OTHER): Payer: Medicare Other | Admitting: Internal Medicine

## 2012-04-01 VITALS — BP 124/72 | HR 50 | Temp 97.6°F | Wt 200.0 lb

## 2012-04-01 DIAGNOSIS — M79609 Pain in unspecified limb: Secondary | ICD-10-CM

## 2012-04-01 DIAGNOSIS — R609 Edema, unspecified: Secondary | ICD-10-CM

## 2012-04-01 DIAGNOSIS — M79661 Pain in right lower leg: Secondary | ICD-10-CM

## 2012-04-01 DIAGNOSIS — E119 Type 2 diabetes mellitus without complications: Secondary | ICD-10-CM

## 2012-04-01 DIAGNOSIS — R600 Localized edema: Secondary | ICD-10-CM

## 2012-04-01 LAB — HEMOGLOBIN A1C: Hgb A1c MFr Bld: 5.9 % (ref 4.6–6.5)

## 2012-04-01 NOTE — Progress Notes (Signed)
Subjective:    Patient ID: [name], male    DOB: 1922/09/11, 79 y.o.   MRN: [id number]  HPI He has had swelling in both legs; it has been worse in the right lower extremity over past week. He is on amlodipine. Furosamide was reduced 03/20/12 by Cardiology based on creatinine values. There has also been some tenderness below the knee to touch. He describes a sensation of the skin of the right lower extremity being thickened compared to the left  He had ridden to [place] last week for a family death.The swelling progressed & associated with pain. He was seen at an urgent care center; apparently venous Doppler was negative. He was placed on antibiotics. Definitive cellulitis never has appeared  Last week he did notice diffuse bruising around the left knee. There has been some swelling below the right anterior knee for approximately 2 weeks even prior to travel  He stopped coming back from [place] every 2 hours over the 11 hour trip  This morning he noticed a knot in the right popliteal space; this did resolve. The pain has moved into the right ankle.    Review of Systems He denies fever, chills, or sweats. Glucoses have been elevated over past month, 127-207. He is not on any diabetic medications He denies chest pain or shortness of breath. While  on the antibiotic he thought that he did have some palpitations.     Objective:   Physical Exam  Gen.: Well-nourished in appearance. Alert, appropriate and cooperative throughout exam.Appears younger than stated age . In no distress Decreased auditory acuity Lungs: Normal respiratory effort; chest expands symmetrically. Lungs are clear to auscultation without rales, wheezes, or increased work of breathing. Heart: Normal rate and rhythm. Normal S1 and S2. No gallop, click, or rub. Grade 1.5-2 /6 systolic murmur. Abdomen: Bowel sounds normal; abdomen soft and nontender. No masses, organomegaly or hernias noted.             Musculoskeletal/extremities:  There is faint resolving ecchymosis over the inferior and medial right knee area. There is a 3 x 2.5 cm hematoma below the right knee anteriorly. There is tense edema from the knee down on the right. Homans sign is equivocal on the right. He has more pain at the ankle with [facility] testing. There is no definite popliteal cyst present. Vascular: Carotid, radial artery, dorsalis pedis and  posterior tibial pulses are full and equal. No bruits present. Neurologic: Alert and oriented x3.          Skin: Intact without suspicious lesions or rashes. Resolving ecchymosis of the right lower extremity is noted. Stasis dermatitis and chronic hypertension pigmentation of all 4 extremities, greatest over the forearms. Lymph: No cervical, axillary Psych: Mood and affect are normal. Normally interactive                                                                                      Assessment & Plan:  #1 progressive edema right lower extremity in the context of decreased diuretic and prolonged travel. Despite the negative venous Doppler; deep venous thrombosis is still of concern  #2 recent subcutaneous bleeding in the right lower  extremity and probable popliteal cyst  #3 uncontrolled diabetes  Plan: See orders and recommendations. Furosemide will be increased to 60 mg daily.

## 2012-04-01 NOTE — Patient Instructions (Addendum)
Increase the furosemide to 60 mg daily. If the d-dimer is elevated; we will need to repeat the venous Doppler because of the recent prolonged travel.

## 2012-04-03 ENCOUNTER — Telehealth: Payer: Self-pay | Admitting: Internal Medicine

## 2012-04-03 NOTE — Telephone Encounter (Signed)
Dr.Hopper addressed labs already

## 2012-04-03 NOTE — Telephone Encounter (Signed)
[facility] Triage Call Report Triage Record Num: [id number] Operator: Tomasita Crumble Patient Name: [name] Call Date & Time: 04/02/2012 8:57:08PM Patient Phone: [phone] PCP: [name] Patient Gender: Male PCP Fax : [phone] Patient DOB: Jun 30, 1922 Practice Name: [facility] Reason for Call: Caller: Montel Clock; PCP: [name]; CB#: [phone]; Call regarding Manjou is calling from [facility] regarding a D-Dimer ordered on [place], [place] by [name].; Result 0.41 (within range); it was done STAT. Information noted and sent to office for follow up per PCP Calls, No Triage protocol and direction from L. Steuer, Charity fundraiser. Protocol(s) Used: PCP Calls, No Triage (Adult) Recommended Outcome per Protocol: Call Provider within 24 Hours Reason for Outcome: Lab calling with test results Care Advice: ~

## 2012-04-03 NOTE — Telephone Encounter (Signed)
Continue the high-dose furosemide and keep the leg elevated as much as possible to help reduce the swelling. If the pain around the knee persists; orthopedic assessment is indicated.

## 2012-04-03 NOTE — Telephone Encounter (Signed)
Spoke with patient, patient verbalized understanding and was without questions at the time of call

## 2012-04-09 ENCOUNTER — Other Ambulatory Visit: Payer: Self-pay | Admitting: *Deleted

## 2012-04-09 NOTE — Telephone Encounter (Signed)
Unable to reach patient to verify exactly which dose of furosemide he is taking. I will sent in this message to PCP since his last dosage was change in Dr Hopper's office 04/01/12.    Micki Riley, CMA

## 2012-04-09 NOTE — Telephone Encounter (Signed)
Furosemide 20 mg, 3 pills daily. Dispense 270.

## 2012-04-10 MED ORDER — FUROSEMIDE 20 MG PO TABS: 60.0000 mg | ORAL_TABLET | Freq: Every day | ORAL | Status: DC

## 2012-04-18 ENCOUNTER — Encounter: Payer: Self-pay | Admitting: Internal Medicine

## 2012-06-06 ENCOUNTER — Other Ambulatory Visit: Payer: Self-pay | Admitting: Nurse Practitioner

## 2012-06-12 ENCOUNTER — Other Ambulatory Visit: Payer: Self-pay

## 2012-06-13 ENCOUNTER — Other Ambulatory Visit: Payer: Self-pay

## 2012-06-13 MED ORDER — OMEPRAZOLE 20 MG PO CPDR: 20.0000 mg | DELAYED_RELEASE_CAPSULE | Freq: Every day | ORAL | Status: DC

## 2012-06-13 NOTE — Telephone Encounter (Signed)
omeprazole (PRILOSEC) 20 MG capsule  Take 1 capsule (20 mg total) by mouth daily.   90 capsule

## 2012-06-17 ENCOUNTER — Other Ambulatory Visit: Payer: Self-pay | Admitting: *Deleted

## 2012-06-17 MED ORDER — POTASSIUM CHLORIDE CRYS ER 20 MEQ PO TBCR: 10.0000 meq | EXTENDED_RELEASE_TABLET | Freq: Every day | ORAL | Status: DC

## 2012-06-18 ENCOUNTER — Ambulatory Visit: Payer: Medicare Other | Admitting: Cardiology

## 2012-06-19 ENCOUNTER — Other Ambulatory Visit: Payer: Self-pay

## 2012-06-19 MED ORDER — OMEPRAZOLE 20 MG PO CPDR: 20.0000 mg | DELAYED_RELEASE_CAPSULE | Freq: Every day | ORAL | Status: DC

## 2012-06-19 NOTE — Telephone Encounter (Signed)
Omeprazole refill needs to go to [facility]

## 2012-06-26 ENCOUNTER — Telehealth: Payer: Self-pay | Admitting: *Deleted

## 2012-06-26 NOTE — Telephone Encounter (Signed)
Per Cardiology last note 03-19-12: Pt Labs are satisfactory but kidney function is a little worse. Would encourage him to try and go back to 40 mg of Lasix Pt due for f/u with Cardiology.Please advise if ok to fill Rx at present dose of 20 mg 3 tab qd

## 2012-06-27 ENCOUNTER — Other Ambulatory Visit: Payer: Self-pay

## 2012-06-27 MED ORDER — FUROSEMIDE 20 MG PO TABS: 60.0000 mg | ORAL_TABLET | Freq: Every day | ORAL | Status: DC

## 2012-06-27 NOTE — Telephone Encounter (Signed)
**Note De-Identified [name] Obfuscation** Per pt request RX for Furosemide sent to [facility] to fill #[phone] with no refills, pt aware.

## 2012-06-27 NOTE — Telephone Encounter (Signed)
Continue daily Lasix as well as daily weights.

## 2012-06-27 NOTE — Telephone Encounter (Signed)
Furosemide dose should be determined by Cardiology

## 2012-06-27 NOTE — Telephone Encounter (Signed)
Need to make sure this got called in.

## 2012-07-23 ENCOUNTER — Ambulatory Visit (INDEPENDENT_AMBULATORY_CARE_PROVIDER_SITE_OTHER): Payer: Medicare Other | Admitting: Cardiology

## 2012-07-23 ENCOUNTER — Encounter: Payer: Self-pay | Admitting: Cardiology

## 2012-07-23 VITALS — BP 142/60 | HR 49 | Ht 70.0 in | Wt 198.1 lb

## 2012-07-23 DIAGNOSIS — I359 Nonrheumatic aortic valve disorder, unspecified: Secondary | ICD-10-CM

## 2012-07-23 DIAGNOSIS — I4891 Unspecified atrial fibrillation: Secondary | ICD-10-CM

## 2012-07-23 DIAGNOSIS — N289 Disorder of kidney and ureter, unspecified: Secondary | ICD-10-CM

## 2012-07-23 DIAGNOSIS — I2510 Atherosclerotic heart disease of native coronary artery without angina pectoris: Secondary | ICD-10-CM

## 2012-07-23 DIAGNOSIS — E119 Type 2 diabetes mellitus without complications: Secondary | ICD-10-CM

## 2012-07-23 DIAGNOSIS — I498 Other specified cardiac arrhythmias: Secondary | ICD-10-CM

## 2012-07-23 DIAGNOSIS — I5032 Chronic diastolic (congestive) heart failure: Secondary | ICD-10-CM

## 2012-07-23 DIAGNOSIS — R001 Bradycardia, unspecified: Secondary | ICD-10-CM

## 2012-07-23 DIAGNOSIS — R609 Edema, unspecified: Secondary | ICD-10-CM

## 2012-07-23 NOTE — Assessment & Plan Note (Signed)
Asymptomatic. Patient told he started becoming dizzy or has presyncope to let us know. We can cut his Inderal 10 mg twice a day hopefully without making his intention tremor worse.

## 2012-07-23 NOTE — Assessment & Plan Note (Signed)
Weight and blood pressure stable. No change in medical program.

## 2012-07-23 NOTE — Progress Notes (Signed)
HPI [name] returns today for evaluation and management of his history of paroxysmal atrial fibrillation on amiodarone but not anticoagulation because of fall risk, history of severe aortic stenosis status post aortic valve replacement at [facility] minimally invasive surgery, coronary disease, a symptomatic bradycardia, chronic kidney disease, diabetes, and history of pancytopenia.  After his surgery due to, he has done better. He is not having any angina or significant dyspnea on exertion. He's much more mobile and still is active in his garden and fishing. He denies any further falls. He also denies any presyncope or syncope or dizziness. He is on low-dose Inderal for intention tremor.  Past Medical History  Diagnosis Date  . Stroke   . Atrial fibrillation     amiodarone therapy; not a candidate for anticoagulation  . Bradycardia   . Aortic stenosis     echo 10/12: EF 65-70%, grade 2 diast dysfxn, severe AS, mean gradient 60 mmHg, mod LAE, PASP 47;  s/p minimally invasive tissue AVR with Dr. Silvestre Mesi at [facility] 01/2011 (pre-AVR cath with no obs CAD)  . Hypertension   . Diabetes mellitus   . Anemia   . Action tremor   . Thrombocytopenia     Dr. [name]  . Throat cancer     s/p resection  . Colon polyp   . Anxiety   . Splenomegaly   . Gout   . GERD (gastroesophageal reflux disease)   . Hypothyroidism   . Leukopenia     Chronic pancytopenia  . Degenerative disc disease   . Joint effusion, knee     left knee  . Synovial cyst of popliteal space   . Cellulitis of left leg 10/11-16/2012  . CKD (chronic kidney disease)   . CHF (congestive heart failure)   . Heart murmur     Current Outpatient Prescriptions  Medication Sig Dispense Refill  . allopurinol (ZYLOPRIM) 100 MG tablet Take 1 tablet (100 mg total) by mouth daily.  90 tablet  3  . amiodarone (PACERONE) 200 MG tablet Take 1 tablet (200 mg total) by mouth daily.  90 tablet  3  . amLODipine (NORVASC) 5 MG tablet Take 1 tablet (5 mg  total) by mouth daily.  90 tablet  3  . aspirin 325 MG tablet Take 325 mg by mouth daily.        . Cholecalciferol (VITAMIN D) 1000 UNITS capsule Take 1 capsule (1,000 Units total) by mouth daily.  90 capsule  3  . colchicine 0.6 MG tablet Take 0.6 mg by mouth as needed. Take one tablet by mouth daily during an acute episode of gout.      . fish oil-omega-3 fatty acids 1000 MG capsule Take 1 g by mouth daily.        . Flaxseed, Linseed, (FLAX SEED OIL) 1000 MG CAPS Take 1 capsule by mouth daily.       . furosemide (LASIX) 20 MG tablet Take 3 tablets (60 mg total) by mouth daily.  270 tablet  0  . gabapentin (NEURONTIN) 100 MG capsule One pill every eight hours as needed; dose may be increased by one pill each dose after 72 hours if only partially effective  30 capsule  2  . hydrALAZINE (APRESOLINE) 50 MG tablet Take 1 tablet (50 mg total) by mouth 3 (three) times daily.  270 tablet  3  . levothyroxine (LEVOTHROID) 25 MCG tablet Take 1 tablet (25 mcg total) by mouth daily.  90 tablet  3  . LORazepam (ATIVAN) 0.5  MG tablet Take 0.5 mg by mouth every 6 (six) hours as needed. For nerves/tremors      . Multiple Vitamin (MULTIVITAMIN) tablet Take 1 tablet by mouth daily.        [name] Kitchen omeprazole (PRILOSEC) 20 MG capsule Take 1 capsule (20 mg total) by mouth daily.  90 capsule  1  . potassium chloride SA (K-DUR,KLOR-CON) 20 MEQ tablet Take 0.5 tablets (10 mEq total) by mouth daily.  45 tablet  3  . propranolol (INDERAL) 10 MG tablet Take 1 tablet (10 mg total) by mouth 3 (three) times daily.  270 tablet  3  . vitamin C (ASCORBIC ACID) 500 MG tablet Take 1 tablet (500 mg total) by mouth daily.  90 tablet  3   No current facility-administered medications for this visit.    Allergies  Allergen Reactions  . Penicillins     Does not remember reaction (~year 1950)  . Neomycin-Bacitracin Zn-Polymyx     ? Reaction (thinks he remembers redness)    Family History  Problem Relation Age of Onset  . Colon  cancer    . Stroke    . Cancer Other     colon  . Stroke Other     History   Social History  . Marital Status: Married    Spouse Name: N/A    Number of Children: 3  . Years of Education: N/A   Occupational History  . Retired Company secretary    Social History Main Topics  . Smoking status: Former Smoker    Quit date: 01/11/1962  . Smokeless tobacco: Not on file  . Alcohol Use: No  . Drug Use: No  . Sexually Active: No   Other Topics Concern  . 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   Lives with his wife, been married to her for 69 years,   Smoked until 1964 about 7 cigarettes a day for 30 years   No alcohol history.   No drug history             ROS ALL NEGATIVE EXCEPT THOSE NOTED IN HPI  PE  General Appearance: well developed, well nourished in no acute distress, chronically ill, frail HEENT: symmetrical face, PERRLA,  Neck: no JVD, thyromegaly, or adenopathy, trachea midline Chest: symmetric without deformity Cardiac: PMI non-displaced, RRR, normal S1, S2, no gallop, soft systolic murmur left sternal border, S2 splits normally, no diastolic component Lung: Decreased breath sounds throughout Vascular: all pulses full without bruits  Abdominal: nondistended, nontender, good bowel sounds, no HSM, no bruits Extremities: no cyanosis, clubbing, chronic pitting in brawny edema no sign of DVT,  Skin: normal color, no rashes, multiple bruises and discolorations Neuro: alert and oriented x 3, non-focal Pysch: normal affect  EKG Sinus bradycardia, rate 49 BMET    Component Value Date/Time   NA 140 03/19/2012 1048   K 4.4 03/19/2012 1048   CL 108 03/19/2012 1048   CO2 26 03/19/2012 1048   GLUCOSE 98 03/19/2012 1048   BUN 43* 03/19/2012 1048   CREATININE 2.2* 03/19/2012 1048   CALCIUM 8.8 03/19/2012 1048   GFRNONAA 30* 12/29/2011 0807   GFRAA 34* 12/29/2011 0807    Lipid Panel     Component Value Date/Time   CHOL 78 09/19/2011 0832    TRIG 65.0 09/19/2011 0832   HDL 35.60* 09/19/2011 0832   CHOLHDL 2 09/19/2011 0832   VLDL 13.0 09/19/2011 0832   LDLCALC 29 09/19/2011 1610  CBC    Component Value Date/Time   WBC 3.7* 03/04/2012 0954   WBC 4.3 12/29/2011 1336   RBC 3.60* 03/04/2012 0954   RBC 3.44* 12/29/2011 1336   HGB 11.0* 03/04/2012 0954   HGB 10.4* 12/29/2011 1336   HCT 32.3* 03/04/2012 0954   HCT 30.4* 12/29/2011 1336   PLT 49* 03/04/2012 0954   PLT 47* 12/29/2011 1336   MCV 89.5 03/04/2012 0954   MCV 88.4 12/29/2011 1336   MCH 30.4 03/04/2012 0954   MCH 30.2 12/29/2011 1336   MCHC 34.0 03/04/2012 0954   MCHC 34.2 12/29/2011 1336   RDW 16.9* 03/04/2012 0954   RDW 15.8* 12/29/2011 1336   LYMPHSABS 0.6* 03/04/2012 0954   LYMPHSABS 0.2* 12/29/2011 1336   MONOABS 0.2 03/04/2012 0954   MONOABS 0.3 12/29/2011 1336   EOSABS 0.1 03/04/2012 0954   EOSABS 0.0 12/29/2011 1336   BASOSABS 0.0 03/04/2012 0954   BASOSABS 0.0 12/29/2011 1336

## 2012-07-23 NOTE — Assessment & Plan Note (Signed)
Stable. No change in medical therapy. 

## 2012-07-23 NOTE — Patient Instructions (Addendum)
You will need lab work today: TSH, LFT,Free T4  We will call you with your results   Your physician recommends that you continue on your current medications as directed. Please refer to the Current Medication list given to you today.  Your physician wants you to follow-up in: 6 months with Dr. Excell Seltzer.  You will receive a reminder letter in the mail two months in advance. If you don't receive a letter, please call our office to schedule the follow-up appointment.

## 2012-07-23 NOTE — Assessment & Plan Note (Signed)
Continue amiodarone. He is not a candidate for anticoagulation with his history of frequent falls not to mention his severe thrombocytopenia.

## 2012-07-23 NOTE — Assessment & Plan Note (Signed)
Stable. Continue medical therapy only.

## 2012-07-23 NOTE — Assessment & Plan Note (Signed)
Doing well status post aortic valve replacement at [facility]. Schedule followup with Dr. Excell Seltzer

## 2012-07-24 LAB — HEPATIC FUNCTION PANEL
ALT: 42 U/L (ref 0–53)
Albumin: 4.1 g/dL (ref 3.5–5.2)
Alkaline Phosphatase: 68 U/L (ref 39–117)
Total Protein: 6.7 g/dL (ref 6.0–8.3)

## 2012-08-26 ENCOUNTER — Telehealth: Payer: Self-pay | Admitting: *Deleted

## 2012-08-26 ENCOUNTER — Other Ambulatory Visit (HOSPITAL_BASED_OUTPATIENT_CLINIC_OR_DEPARTMENT_OTHER): Payer: Medicare Other | Admitting: Lab

## 2012-08-26 ENCOUNTER — Other Ambulatory Visit: Payer: Self-pay

## 2012-08-26 ENCOUNTER — Ambulatory Visit (HOSPITAL_BASED_OUTPATIENT_CLINIC_OR_DEPARTMENT_OTHER): Payer: Medicare Other | Admitting: Oncology

## 2012-08-26 VITALS — BP 170/52 | HR 40 | Temp 96.8°F | Resp 18 | Ht 70.0 in | Wt 197.3 lb

## 2012-08-26 DIAGNOSIS — E119 Type 2 diabetes mellitus without complications: Secondary | ICD-10-CM

## 2012-08-26 DIAGNOSIS — R001 Bradycardia, unspecified: Secondary | ICD-10-CM

## 2012-08-26 DIAGNOSIS — D696 Thrombocytopenia, unspecified: Secondary | ICD-10-CM

## 2012-08-26 DIAGNOSIS — I498 Other specified cardiac arrhythmias: Secondary | ICD-10-CM

## 2012-08-26 LAB — CBC WITH DIFFERENTIAL/PLATELET
Eosinophils Absolute: 0.1 10*3/uL (ref 0.0–0.5)
HCT: 31.6 % — ABNORMAL LOW (ref 38.4–49.9)
HGB: 10.4 g/dL — ABNORMAL LOW (ref 13.0–17.1)
LYMPH%: 20.9 % (ref 14.0–49.0)
MONO#: 0.2 10*3/uL (ref 0.1–0.9)
NEUT#: 2.1 10*3/uL (ref 1.5–6.5)
Platelets: 54 10*3/uL — ABNORMAL LOW (ref 140–400)
RBC: 3.56 10*6/uL — ABNORMAL LOW (ref 4.20–5.82)
WBC: 3 10*3/uL — ABNORMAL LOW (ref 4.0–10.3)
nRBC: 0 % (ref 0–0)

## 2012-08-26 LAB — TECHNOLOGIST REVIEW

## 2012-08-26 NOTE — Addendum Note (Signed)
Addended by: Burnard Bunting C on: 08/26/2012 11:50 AM   Modules accepted: Orders

## 2012-08-26 NOTE — Addendum Note (Signed)
Addended by: Thornton Papas B on: 08/26/2012 12:44 PM   Modules accepted: Orders

## 2012-08-26 NOTE — Progress Notes (Signed)
   [facility]    OFFICE PROGRESS NOTE   INTERVAL HISTORY:   He returns as scheduled. He reports feeling weak for the past several days. He has episodes of anterior chest pain lasting for minutes. He has also noted a slow heart rate for the past several days and exertional dyspnea. No bleeding.  Objective:  Vital signs in last 24 hours:  Blood pressure 170/52, pulse 40, temperature 96.8 F (36 C), temperature source Oral, resp. rate 18, height 5\' 10"  (1.778 m), weight 197 lb 4.8 oz (89.495 kg).    HEENT: Neck without mass Lymphatics: No cervical, supraclavicular, or axillary nodes Resp: Lungs clear bilaterally Cardio: Bradycardia, irregular, 2/6 systolic and diastolic murmurs GI: No hepatosplenomegaly Vascular: 1+ edema at the right greater than left lower leg   Lab Results:  Lab Results  Component Value Date   WBC 3.0* 08/26/2012   HGB 10.4* 08/26/2012   HCT 31.6* 08/26/2012   MCV 88.8 08/26/2012   PLT 54* 08/26/2012   ANC 2.1    Medications: I have reviewed the patient's current medications.  Assessment/Plan: 1. Chronic pancytopenia-the thrombocytopenia responded to Nplate therapy prior to aortic valve replacement surgery 2. History of Splenomegaly. Not palpable today 3. History of atrial fibrillation.  4. Admission with a left lower extremity cellulitis in June 2009.  5. History of "Hot flashes," etiology unclear.  6. Diabetes.  7. Severe aortic stenosis-he underwent aortic valve replacement surgery at [facility] on 01/23/2011 8. 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  9. Episode of gout at the right great toe following the aortic valve replacement  surgery  10. Bradycardia-appears to be symptomatic, we will check an EKG and contact cardiology  Disposition:  [name] appears stable from a hematologic standpoint. He will return for an office visit and CBC in 6 months. I will contact cardiology today regarding the bradycardia.   Thornton Papas, MD  08/26/2012  11:18 AM  The EKG revealed a sinus bradycardia. I discussed the case with Dr. Jens Som. We will discontinue the propranolol and increase the amlodipine to 10 mg daily. [name] will go to the emergency room if he develops presyncope symptoms or recurrent chest pain. Cardiology will contact him for an appointment.

## 2012-08-26 NOTE — Telephone Encounter (Signed)
Dr Jens Som spoke with dr [name] regarding this pt and his fatigue. EKG was faxed and reviewed by dr Jens Som. Dr [name] was going to stop the pts beta blocker and increase his amlodipine for bp control. Dr [name] requested an appt for pt to be seen sooner than November. Follow up appt scheduled. Left message for pt to call

## 2012-08-27 ENCOUNTER — Telehealth: Payer: Self-pay

## 2012-08-27 NOTE — Telephone Encounter (Signed)
s.w. pt and advised on feb 2015 appt...pt ok and aware

## 2012-09-02 ENCOUNTER — Ambulatory Visit (INDEPENDENT_AMBULATORY_CARE_PROVIDER_SITE_OTHER): Payer: Medicare Other | Admitting: Cardiovascular Disease

## 2012-09-02 ENCOUNTER — Encounter: Payer: Self-pay | Admitting: Cardiovascular Disease

## 2012-09-02 VITALS — BP 140/60 | HR 60 | Ht 70.0 in | Wt 195.0 lb

## 2012-09-02 DIAGNOSIS — I2510 Atherosclerotic heart disease of native coronary artery without angina pectoris: Secondary | ICD-10-CM

## 2012-09-02 DIAGNOSIS — I4891 Unspecified atrial fibrillation: Secondary | ICD-10-CM

## 2012-09-02 DIAGNOSIS — I359 Nonrheumatic aortic valve disorder, unspecified: Secondary | ICD-10-CM

## 2012-09-02 MED ORDER — METOPROLOL TARTRATE 25 MG PO TABS: ORAL_TABLET | ORAL | Status: DC

## 2012-09-02 MED ORDER — POTASSIUM CHLORIDE CRYS ER 20 MEQ PO TBCR: 10.0000 meq | EXTENDED_RELEASE_TABLET | Freq: Every day | ORAL | Status: DC

## 2012-09-02 NOTE — Patient Instructions (Addendum)
Your physician has recommended you make the following change in your medication: DECREASE Amiodarone to 200mg  take one-half tablet by mouth daily, START Metoprolol Tartrate 25mg  take one-half tablet by mouth twice a day  Your physician has recommended that you wear a 24 hour holter monitor. Holter monitors are medical devices that record the heart's electrical activity. Doctors most often use these monitors to diagnose arrhythmias. Arrhythmias are problems with the speed or rhythm of the heartbeat. The monitor is a small, portable device. You can wear one while you do your normal daily activities. This is usually used to diagnose what is causing palpitations/syncope (passing out).  Your physician recommends that you keep your scheduled follow-up appointment in November.

## 2012-09-07 NOTE — Progress Notes (Signed)
HPI:  79 year-old male presenting for follow-up evaluation. He has been followed by Dr Daleen Squibb and I will be seeing him in Dr Vern Claude retirement.   The patient has a complex cardiac and medical history. He has severe aortic stenosis and underwent AVR at [facility] in 2013 via a minimally-invasive approach. A bioprosthetic valve was chosen. He is on amiodarone for atrial fibrillation. He hasn't been anticoagulated because of recurrent falls and advanced age.  He was recently seen by Dr [name] and his inderal was stopped because of marked bradycardia with heart rate about 40 bpm. The patient has complained of feeling weak and fatigued. He has also had intermittent chest pain with no exertional component. Since his beta blocker was stopped, he feels very 'nervous' and 'jittery.' He denies dyspnea, orhtopnea, or PND. Admits to chronic leg swelling. States he is 'give out.' No lightheadedness or syncope.  Outpatient Encounter Prescriptions as of 09/02/2012  Medication Sig Dispense Refill  . allopurinol (ZYLOPRIM) 100 MG tablet Take 1 tablet (100 mg total) by mouth daily.  90 tablet  3  . amiodarone (PACERONE) 200 MG tablet Take 0.5 tablets (100 mg total) by mouth daily.  1 tablet  0  . amLODipine (NORVASC) 10 MG tablet Take 10 mg by mouth daily.      [name] Kitchen aspirin 325 MG tablet Take 325 mg by mouth daily.        . Cholecalciferol (VITAMIN D) 1000 UNITS capsule Take 1 capsule (1,000 Units total) by mouth daily.  90 capsule  3  . colchicine 0.6 MG tablet Take 0.6 mg by mouth as needed. Take one tablet by mouth daily during an acute episode of gout.      . fish oil-omega-3 fatty acids 1000 MG capsule Take 1 g by mouth daily.        . Flaxseed, Linseed, (FLAX SEED OIL) 1000 MG CAPS Take 1 capsule by mouth daily.       . furosemide (LASIX) 20 MG tablet Take 3 tablets (60 mg total) by mouth daily.  270 tablet  0  . hydrALAZINE (APRESOLINE) 50 MG tablet Take 1 tablet (50 mg total) by mouth 3 (three) times daily.  270  tablet  3  . levothyroxine (LEVOTHROID) 25 MCG tablet Take 1 tablet (25 mcg total) by mouth daily.  90 tablet  3  . LORazepam (ATIVAN) 0.5 MG tablet Take 0.5 mg by mouth every 6 (six) hours as needed. For nerves/tremors      . Multiple Vitamin (MULTIVITAMIN) tablet Take 1 tablet by mouth daily.        [name] Kitchen omeprazole (PRILOSEC) 20 MG capsule Take 1 capsule (20 mg total) by mouth daily.  90 capsule  1  . potassium chloride SA (K-DUR,KLOR-CON) 20 MEQ tablet Take 0.5 tablets (10 mEq total) by mouth daily.  15 tablet  3  . vitamin C (ASCORBIC ACID) 500 MG tablet Take 1 tablet (500 mg total) by mouth daily.  90 tablet  3  . [DISCONTINUED] amiodarone (PACERONE) 200 MG tablet Take 1 tablet (200 mg total) by mouth daily.  90 tablet  3  . [DISCONTINUED] potassium chloride SA (K-DUR,KLOR-CON) 20 MEQ tablet Take 0.5 tablets (10 mEq total) by mouth daily.  45 tablet  3  . metoprolol tartrate (LOPRESSOR) 25 MG tablet Take one-half tablet by mouth twice a day  30 tablet  3  . [DISCONTINUED] amLODipine (NORVASC) 5 MG tablet Take 1 tablet (5 mg total) by mouth daily.  90 tablet  3  . [  DISCONTINUED] gabapentin (NEURONTIN) 100 MG capsule One pill every eight hours as needed; dose may be increased by one pill each dose after 72 hours if only partially effective  30 capsule  2   No facility-administered encounter medications on file as of 09/02/2012.    Allergies  Allergen Reactions  . Penicillins     Does not remember reaction (~year 1950)  . Neomycin-Bacitracin Zn-Polymyx     ? Reaction (thinks he remembers redness)    Past Medical History  Diagnosis Date  . Stroke   . Atrial fibrillation     amiodarone therapy; not a candidate for anticoagulation  . Bradycardia   . Aortic stenosis     echo 10/12: EF 65-70%, grade 2 diast dysfxn, severe AS, mean gradient 60 mmHg, mod LAE, PASP 47;  s/p minimally invasive tissue AVR with Dr. Silvestre Mesi at [facility] 01/2011 (pre-AVR cath with no obs CAD)  . Hypertension   . Diabetes  mellitus   . Anemia   . Action tremor   . Thrombocytopenia     Dr. [name]  . Throat cancer     s/p resection  . Colon polyp   . Anxiety   . Splenomegaly   . Gout   . GERD (gastroesophageal reflux disease)   . Hypothyroidism   . Leukopenia     Chronic pancytopenia  . Degenerative disc disease   . Joint effusion, knee     left knee  . Synovial cyst of popliteal space   . Cellulitis of left leg 10/11-16/2012  . CKD (chronic kidney disease)   . CHF (congestive heart failure)   . Heart murmur     ROS: Negative except as per HPI  BP 140/60  Pulse 60  Ht 5\' 10"  (1.778 m)  Wt 195 lb (88.451 kg)  BMI 27.98 kg/m2  SpO2 96%  PHYSICAL EXAM: Pt is alert and oriented, pleasant elderly male in NAD HEENT: normal Neck: JVP - normal, carotids 2+=  Lungs: CTA bilaterally CV: RRR with soft systolic ejection murmur at the LSB and RUSB Abd: soft, NT, Positive BS, no hepatomegaly Ext: 1+ pretibial edema with chronic stasis changes, distal pulses intact and equal Skin: diffuse ecchymoses  EKG:  Sinus rhythm, HR 60 bpm, incomplete LBBB, LVH  ASSESSMENT AND PLAN: 1. PAF with bradycardia in sinus rhythm. meds reviewed. Note he feels poorly off of his beta-blocker. Will try to reduce his amiodarone to 100 mg daily and start him on metoprolol 12.5 mg BID. Will obtain a 24 hour Holter monitor to assess heart rate response to this change. Pt will keep his follow-up appt in November and will call if problems arise in the interim.  2. Aortic stenosis s/p AVR. Normal functioning prosthesis post-operatively.  3. CAD - stable without angina. On ASA, statin, and beta-blocker resumed.  Tonny Bollman 09/07/2012 12:16 PM

## 2012-09-09 ENCOUNTER — Telehealth: Payer: Self-pay | Admitting: Cardiovascular Disease

## 2012-09-09 NOTE — Telephone Encounter (Signed)
This pt needs appointment for 24 hour holter monitor.  I will have [facility] contact the pt.

## 2012-09-09 NOTE — Telephone Encounter (Signed)
New Problem   24 hour holt// Pt wants to know when he should start. request a call back to discuss.

## 2012-09-09 NOTE — Telephone Encounter (Signed)
Appointment scheduled on 09/10/12.

## 2012-09-10 ENCOUNTER — Encounter (INDEPENDENT_AMBULATORY_CARE_PROVIDER_SITE_OTHER): Payer: Medicare Other

## 2012-09-10 ENCOUNTER — Telehealth: Payer: Self-pay | Admitting: *Deleted

## 2012-09-10 DIAGNOSIS — I2510 Atherosclerotic heart disease of native coronary artery without angina pectoris: Secondary | ICD-10-CM

## 2012-09-10 DIAGNOSIS — I4891 Unspecified atrial fibrillation: Secondary | ICD-10-CM

## 2012-09-10 DIAGNOSIS — I359 Nonrheumatic aortic valve disorder, unspecified: Secondary | ICD-10-CM

## 2012-09-10 NOTE — Telephone Encounter (Signed)
24 hr holter monitor placed on Pt 09/10/12 TK

## 2012-09-11 ENCOUNTER — Other Ambulatory Visit: Payer: Self-pay | Admitting: Cardiology

## 2012-09-11 MED ORDER — FUROSEMIDE 20 MG PO TABS: 60.0000 mg | ORAL_TABLET | Freq: Every day | ORAL | Status: DC

## 2012-09-16 ENCOUNTER — Other Ambulatory Visit: Payer: Self-pay

## 2012-09-16 MED ORDER — POTASSIUM CHLORIDE ER 10 MEQ PO TBCR: 10.0000 meq | EXTENDED_RELEASE_TABLET | Freq: Every day | ORAL | Status: DC

## 2012-09-18 ENCOUNTER — Other Ambulatory Visit: Payer: Self-pay | Admitting: *Deleted

## 2012-09-18 ENCOUNTER — Other Ambulatory Visit: Payer: Self-pay

## 2012-09-18 MED ORDER — AMLODIPINE BESYLATE 10 MG PO TABS: 10.0000 mg | ORAL_TABLET | Freq: Every day | ORAL | Status: DC

## 2012-09-18 MED ORDER — FUROSEMIDE 20 MG PO TABS: 60.0000 mg | ORAL_TABLET | Freq: Every day | ORAL | Status: DC

## 2012-10-01 ENCOUNTER — Other Ambulatory Visit: Payer: Self-pay

## 2012-10-01 DIAGNOSIS — I359 Nonrheumatic aortic valve disorder, unspecified: Secondary | ICD-10-CM

## 2012-10-01 DIAGNOSIS — I4891 Unspecified atrial fibrillation: Secondary | ICD-10-CM

## 2012-10-01 MED ORDER — METOPROLOL TARTRATE 25 MG PO TABS: ORAL_TABLET | ORAL | Status: DC

## 2012-10-10 ENCOUNTER — Other Ambulatory Visit: Payer: Self-pay

## 2012-10-10 ENCOUNTER — Other Ambulatory Visit: Payer: Self-pay | Admitting: *Deleted

## 2012-10-10 DIAGNOSIS — E039 Hypothyroidism, unspecified: Secondary | ICD-10-CM

## 2012-10-10 MED ORDER — LEVOTHYROXINE SODIUM 25 MCG PO TABS: 25.0000 ug | ORAL_TABLET | Freq: Every day | ORAL | Status: DC

## 2012-11-11 ENCOUNTER — Other Ambulatory Visit: Payer: Self-pay

## 2012-11-11 ENCOUNTER — Ambulatory Visit (INDEPENDENT_AMBULATORY_CARE_PROVIDER_SITE_OTHER): Payer: Medicare Other | Admitting: Cardiovascular Disease

## 2012-11-11 ENCOUNTER — Encounter: Payer: Self-pay | Admitting: Cardiovascular Disease

## 2012-11-11 VITALS — BP 160/80 | HR 64 | Ht 70.0 in | Wt 196.0 lb

## 2012-11-11 DIAGNOSIS — I4891 Unspecified atrial fibrillation: Secondary | ICD-10-CM

## 2012-11-11 DIAGNOSIS — I359 Nonrheumatic aortic valve disorder, unspecified: Secondary | ICD-10-CM

## 2012-11-11 DIAGNOSIS — I2510 Atherosclerotic heart disease of native coronary artery without angina pectoris: Secondary | ICD-10-CM

## 2012-11-11 MED ORDER — ALLOPURINOL 100 MG PO TABS: 100.0000 mg | ORAL_TABLET | Freq: Every day | ORAL | Status: DC

## 2012-11-11 MED ORDER — FUROSEMIDE 40 MG PO TABS: 80.0000 mg | ORAL_TABLET | Freq: Every day | ORAL | Status: DC

## 2012-11-11 MED ORDER — AMLODIPINE BESYLATE 5 MG PO TABS: 5.0000 mg | ORAL_TABLET | Freq: Every day | ORAL | Status: DC

## 2012-11-11 NOTE — Progress Notes (Signed)
HPI:   80 year old gentleman presenting for followup evaluation. The patient has been followed for aortic stenosis. He underwent an aortic valve replacement with a bioprosthesis in 2013 at [facility] via a minimally invasive approach. He takes amiodarone for atrial fibrillation and has not been anticoagulated because of recurrent falls and advanced age. He's also had bradycardia without clear symptoms. He felt poorly off of his beta blocker. He is currently taking metoprolol tartrate 12.5 mg once daily. When he was taking this twice daily his heart rate was in the 40s. With once daily dosing his heart rate is about 60 beats per minute. He has fatigue and leg swelling. This is a chronic problem. He has no orthopnea, PND, or chest pain.  Outpatient Encounter Prescriptions as of 11/11/2012  Medication Sig  . allopurinol (ZYLOPRIM) 100 MG tablet Take 1 tablet (100 mg total) by mouth daily.  [name] Kitchen amiodarone (PACERONE) 200 MG tablet Take 0.5 tablets (100 mg total) by mouth daily.  [name] Kitchen amLODipine (NORVASC) 10 MG tablet Take 1 tablet (10 mg total) by mouth daily.  [name] Kitchen aspirin 325 MG tablet Take 325 mg by mouth daily.    . Cholecalciferol (VITAMIN D) 1000 UNITS capsule Take 1 capsule (1,000 Units total) by mouth daily.  . colchicine 0.6 MG tablet Take 0.6 mg by mouth as needed. Take one tablet by mouth daily during an acute episode of gout.  . fish oil-omega-3 fatty acids 1000 MG capsule Take 1 g by mouth daily.    . Flaxseed, Linseed, (FLAX SEED OIL) 1000 MG CAPS Take 1 capsule by mouth daily.   . furosemide (LASIX) 20 MG tablet Take 3 tablets (60 mg total) by mouth daily.  . hydrALAZINE (APRESOLINE) 50 MG tablet Take 1 tablet (50 mg total) by mouth 3 (three) times daily.  [name] Kitchen levothyroxine (LEVOTHROID) 25 MCG tablet Take 1 tablet (25 mcg total) by mouth daily.  [name] Kitchen LORazepam (ATIVAN) 0.5 MG tablet Take 0.5 mg by mouth every 6 (six) hours as needed. For nerves/tremors  . metoprolol tartrate (LOPRESSOR) 25 MG  tablet Take one-half tablet by mouth twice a day  . Multiple Vitamin (MULTIVITAMIN) tablet Take 1 tablet by mouth daily.    [name] Kitchen omeprazole (PRILOSEC) 20 MG capsule Take 1 capsule (20 mg total) by mouth daily.  . potassium chloride (K-DUR) 10 MEQ tablet Take 1 tablet (10 mEq total) by mouth daily.  . vitamin C (ASCORBIC ACID) 500 MG tablet Take 1 tablet (500 mg total) by mouth daily.    Allergies  Allergen Reactions  . Penicillins     Does not remember reaction (~year 1950)  . Neomycin-Bacitracin Zn-Polymyx     ? Reaction (thinks he remembers redness)    Past Medical History  Diagnosis Date  . Stroke   . Atrial fibrillation     amiodarone therapy; not a candidate for anticoagulation  . Bradycardia   . Aortic stenosis     echo 10/12: EF 65-70%, grade 2 diast dysfxn, severe AS, mean gradient 60 mmHg, mod LAE, PASP 47;  s/p minimally invasive tissue AVR with Dr. Silvestre Mesi at [facility] 01/2011 (pre-AVR cath with no obs CAD)  . Hypertension   . Diabetes mellitus   . Anemia   . Action tremor   . Thrombocytopenia     Dr. [name]  . Throat cancer     s/p resection  . Colon polyp   . Anxiety   . Splenomegaly   . Gout   . GERD (gastroesophageal reflux disease)   .  Hypothyroidism   . Leukopenia     Chronic pancytopenia  . Degenerative disc disease   . Joint effusion, knee     left knee  . Synovial cyst of popliteal space   . Cellulitis of left leg 10/11-16/2012  . CKD (chronic kidney disease)   . CHF (congestive heart failure)   . Heart murmur    ROS: Negative except as per HPI  BP 160/80  Pulse 64  Ht 5\' 10"  (1.778 m)  Wt 196 lb (88.905 kg)  BMI 28.12 kg/m2  PHYSICAL EXAM: Pt is alert and oriented, pleasant elderly male in NAD HEENT: normal Neck: JVP - normal Lungs: CTA bilaterally CV: RRR with grade 2/6 systolic ejection murmur at the left sternal border with a soft 1/6 diastolic decrescendo murmur best heard at the right upper sternal border Abd: soft, NT, Positive BS, no  hepatomegaly Ext: 2+ pretibial pitting edema bilaterally, distal pulses intact and equal Skin: warm/dry no rash  2D Echo 03/01/2011: Study Conclusions  - Left ventricle: The cavity size was normal. Wall thickness was increased in a pattern of moderate LVH. The estimated ejection fraction was 60%. Wall motion was normal; there were no regional wall motion abnormalities. Doppler parameters are consistent with high ventricular filling pressure. - Aortic valve: There is a prosthetic valve in the aortic postion. There is no AS. There is an AI signal that is vey eccentric. This may be a perivalvular leak. - Left atrium: The atrium was moderately dilated. - Right ventricle: The cavity size was mildly dilated. - Right atrium: The atrium was moderately dilated. - Tricuspid valve: Mild-moderate regurgitation. - Pulmonary arteries: PA peak pressure: 42mm Hg (S). - Impressions: There is a very unusual color signal seen in the PA in the short axis view. The signal appears to have continuous flow. it is seen in the outside wall of the PA (across from where the PA and aorta touch each other. This signal was not present on the pre-op study. Etiology is not clear. I doubt it is the eccentric AI signal. Impressions:  - There is a very unusual color signal seen in the PA in the short axis view. The signal appears to have continuous flow. it is seen in the outside wall of the PA (across from where the PA and aorta touch each other. This signal was not present on the pre-op study. Etiology is not clear. I doubt it is the eccentric AI signal.  ASSESSMENT AND PLAN: 1. Aortic valve disease status post bioprosthetic aortic valve replacement. His exam is stable. He has no current symptoms of congestive heart failure. Will continue his current medical program which includes aspirin for antiplatelet therapy.  2. Bradycardia. He will continue on low-dose metoprolol 12.5 mg daily. Heart rates are  acceptable.  3. Chronic leg swelling. Suspect multifactorial with chronic diastolic heart failure and venous insufficiency. Venous insufficiency is likely the primary issue. However, his edema is significant. I have recommended that he increase furosemide to 80 mg daily. He is on potassium supplementation. Will repeat a metabolic panel and 2 weeks. He understands the leg elevation and salt restriction are important parts of his treatment plan.  4. Atrial fibrillation. He continues on low-dose amiodarone and is maintaining sinus rhythm.  Tonny Bollman 11/11/2012 2:37 PM

## 2012-11-11 NOTE — Patient Instructions (Addendum)
Your physician has recommended you make the following change in your medication: INCREASE Furosemide to 80mg  daily, DECREASE Amlodipine to 5mg  take one by mouth daily  Your physician recommends that you return for lab work in: 2 WEEKS (BMP)  Your physician recommends that you schedule a follow-up appointment in: 4 MONTHS with Dr Excell Seltzer

## 2012-11-25 ENCOUNTER — Ambulatory Visit: Payer: Medicare Other | Admitting: Internal Medicine

## 2012-11-25 ENCOUNTER — Other Ambulatory Visit: Payer: Self-pay

## 2012-11-25 ENCOUNTER — Other Ambulatory Visit (INDEPENDENT_AMBULATORY_CARE_PROVIDER_SITE_OTHER): Payer: Medicare Other

## 2012-11-25 DIAGNOSIS — I2510 Atherosclerotic heart disease of native coronary artery without angina pectoris: Secondary | ICD-10-CM

## 2012-11-25 DIAGNOSIS — I4891 Unspecified atrial fibrillation: Secondary | ICD-10-CM

## 2012-11-25 DIAGNOSIS — I359 Nonrheumatic aortic valve disorder, unspecified: Secondary | ICD-10-CM

## 2012-11-25 LAB — BASIC METABOLIC PANEL
CO2: 23 mEq/L (ref 19–32)
Chloride: 106 mEq/L (ref 96–112)
Creatinine, Ser: 2.2 mg/dL — ABNORMAL HIGH (ref 0.4–1.5)
Glucose, Bld: 150 mg/dL — ABNORMAL HIGH (ref 70–99)
Potassium: 3.6 mEq/L (ref 3.5–5.1)

## 2012-11-25 MED ORDER — METOPROLOL TARTRATE 25 MG PO TABS: 12.5000 mg | ORAL_TABLET | Freq: Every day | ORAL | Status: DC

## 2012-11-26 ENCOUNTER — Ambulatory Visit: Payer: Medicare Other | Admitting: Cardiovascular Disease

## 2012-12-01 ENCOUNTER — Ambulatory Visit (INDEPENDENT_AMBULATORY_CARE_PROVIDER_SITE_OTHER): Payer: Medicare Other | Admitting: Nurse Practitioner

## 2012-12-01 ENCOUNTER — Telehealth: Payer: Self-pay | Admitting: Cardiovascular Disease

## 2012-12-01 ENCOUNTER — Telehealth: Payer: Self-pay | Admitting: *Deleted

## 2012-12-01 ENCOUNTER — Other Ambulatory Visit: Payer: Self-pay | Admitting: *Deleted

## 2012-12-01 ENCOUNTER — Encounter: Payer: Self-pay | Admitting: Nurse Practitioner

## 2012-12-01 VITALS — BP 130/48 | HR 72 | Ht 70.0 in | Wt 196.8 lb

## 2012-12-01 DIAGNOSIS — I359 Nonrheumatic aortic valve disorder, unspecified: Secondary | ICD-10-CM

## 2012-12-01 DIAGNOSIS — R609 Edema, unspecified: Secondary | ICD-10-CM

## 2012-12-01 LAB — BASIC METABOLIC PANEL
BUN: 34 mg/dL — ABNORMAL HIGH (ref 6–23)
CO2: 22 mEq/L (ref 19–32)
Calcium: 8.6 mg/dL (ref 8.4–10.5)
Chloride: 104 mEq/L (ref 96–112)
Creatinine, Ser: 2.2 mg/dL — ABNORMAL HIGH (ref 0.4–1.5)
GFR: 30.12 mL/min — ABNORMAL LOW (ref >60.00)
Glucose, Bld: 102 mg/dL — ABNORMAL HIGH (ref 70–99)
Potassium: 3.7 mEq/L (ref 3.5–5.1)
Sodium: 134 mEq/L — ABNORMAL LOW (ref 135–145)

## 2012-12-01 LAB — CBC WITH DIFFERENTIAL/PLATELET
Basophils Absolute: 0 10*3/uL (ref 0.0–0.1)
Basophils Relative: 0.3 % (ref 0.0–3.0)
Eosinophils Absolute: 0 10*3/uL (ref 0.0–0.7)
Eosinophils Relative: 0.3 % (ref 0.0–5.0)
HCT: 25.9 % — ABNORMAL LOW (ref 39.0–52.0)
Hemoglobin: 8.7 g/dL — ABNORMAL LOW (ref 13.0–17.0)
Lymphocytes Relative: 15.1 % (ref 12.0–46.0)
Lymphs Abs: 0.3 10*3/uL — ABNORMAL LOW (ref 0.7–4.0)
MCHC: 33.7 g/dL (ref 30.0–36.0)
MCV: 86.4 fl (ref 78.0–100.0)
Monocytes Absolute: 0.2 10*3/uL (ref 0.1–1.0)
Monocytes Relative: 9.2 % (ref 3.0–12.0)
Neutro Abs: 1.7 10*3/uL (ref 1.4–7.7)
Neutrophils Relative %: 75.1 % (ref 43.0–77.0)
Platelets: 65 10*3/uL — ABNORMAL LOW (ref 150.0–400.0)
RBC: 2.99 Mil/uL — ABNORMAL LOW (ref 4.22–5.81)
RDW: 16.7 % — ABNORMAL HIGH (ref 11.5–14.6)
WBC: 2.3 10*3/uL — ABNORMAL LOW (ref 4.5–10.5)

## 2012-12-01 LAB — TSH: TSH: 2.6 u[IU]/mL (ref 0.35–5.50)

## 2012-12-01 MED ORDER — DOXYCYCLINE HYCLATE 100 MG PO TABS: 100.0000 mg | ORAL_TABLET | Freq: Two times a day (BID) | ORAL | Status: DC

## 2012-12-01 MED ORDER — DOXYCYCLINE HYCLATE 50 MG PO CAPS: 50.0000 mg | ORAL_CAPSULE | Freq: Two times a day (BID) | ORAL | Status: DC

## 2012-12-01 NOTE — Progress Notes (Signed)
[name] Date of Birth: [birth year] Medical Record #[id number]  History of Present Illness: [name] is seen back today for a work in visit. Seen for Dr. Excell Seltzer - former patient of Dr. Vern Claude. He has multiple issues which include a history of minimally invasive AVR by Dr. Silvestre Mesi at [facility] in January of 2013. Follow up echo done in February of 2013. Echo 2/13: Moderate LVH, EF 60%, AVR okay, moderate LAE, mild RVE, moderate RAE, mild to moderate TR, PASP 42. Has PAF and is on chronic amiodarone. Not a candidate for coumadin due to anemia, thrombocytopenia and falls. Other issues include HTN (hard to control), DM, tremor, prior throat cancer, gout, GERD, hypothyroidism, CKD and DJD. He has had a tendency towards bradycardia and has not been able to tolerate Cardizem but has been maintained on just low dose beta blocker therapy. He has chronic venous insufficiency and diastolic heart failure.  Was just here earlier this month. Seemed to be doing ok but did have more swelling - Lasix was increased - Norvasc was cut back.   Comes back today. Here with his wife. Called earlier today with fatigue and weight gain. Has not felt well for the past week. Feels weak and tired. Got real thirsty today. No fever or chills. Some cough. Wife thought he was getting a cold. Appetite down. Some diarrhea today and vomited a few days ago. No sick contacts. Says his weight is up but it is really unchanged from prior visits.   Current Outpatient Prescriptions  Medication Sig Dispense Refill  . allopurinol (ZYLOPRIM) 100 MG tablet Take 1 tablet (100 mg total) by mouth daily.  90 tablet  3  . amiodarone (PACERONE) 200 MG tablet Take 0.5 tablets (100 mg total) by mouth daily.  1 tablet  0  . amLODipine (NORVASC) 5 MG tablet Take 1 tablet (5 mg total) by mouth daily.  90 tablet  3  . aspirin 325 MG tablet Take 325 mg by mouth daily.        . Cholecalciferol (VITAMIN D) 1000 UNITS capsule Take 1 capsule (1,000 Units total) by  mouth daily.  90 capsule  3  . colchicine 0.6 MG tablet Take 0.6 mg by mouth as needed. Take one tablet by mouth daily during an acute episode of gout.      . fish oil-omega-3 fatty acids 1000 MG capsule Take 1 g by mouth daily.        . Flaxseed, Linseed, (FLAX SEED OIL) 1000 MG CAPS Take 1 capsule by mouth daily.       . furosemide (LASIX) 40 MG tablet Take 2 tablets (80 mg total) by mouth daily.  180 tablet  3  . hydrALAZINE (APRESOLINE) 50 MG tablet Take 1 tablet (50 mg total) by mouth 3 (three) times daily.  270 tablet  3  . levothyroxine (LEVOTHROID) 25 MCG tablet Take 1 tablet (25 mcg total) by mouth daily.  90 tablet  3  . LORazepam (ATIVAN) 0.5 MG tablet Take 0.5 mg by mouth every 6 (six) hours as needed. For nerves/tremors      . metoprolol tartrate (LOPRESSOR) 25 MG tablet Take 0.5 tablets (12.5 mg total) by mouth daily.  90 tablet  3  . Multiple Vitamin (MULTIVITAMIN) tablet Take 1 tablet by mouth daily.        [name] Kitchen omeprazole (PRILOSEC) 20 MG capsule Take 1 capsule (20 mg total) by mouth daily.  90 capsule  1  . potassium chloride (K-DUR) 10 MEQ tablet Take  1 tablet (10 mEq total) by mouth daily.  90 tablet  3  . vitamin C (ASCORBIC ACID) 500 MG tablet Take 1 tablet (500 mg total) by mouth daily.  90 tablet  3   No current facility-administered medications for this visit.    Allergies  Allergen Reactions  . Penicillins     Does not remember reaction (~year 1948)  . Neomycin-Bacitracin Zn-Polymyx     ? Reaction (thinks he remembers redness)    Past Medical History  Diagnosis Date  . Stroke   . Atrial fibrillation     amiodarone therapy; not a candidate for anticoagulation  . Bradycardia   . Aortic stenosis     echo 10/12: EF 65-70%, grade 2 diast dysfxn, severe AS, mean gradient 60 mmHg, mod LAE, PASP 47;  s/p minimally invasive tissue AVR with Dr. Silvestre Mesi at [facility] 01/2011 (pre-AVR cath with no obs CAD)  . Hypertension   . Diabetes mellitus   . Anemia   . Action tremor   .  Thrombocytopenia     Dr. [name]  . Throat cancer     s/p resection  . Colon polyp   . Anxiety   . Splenomegaly   . Gout   . GERD (gastroesophageal reflux disease)   . Hypothyroidism   . Leukopenia     Chronic pancytopenia  . Degenerative disc disease   . Joint effusion, knee     left knee  . Synovial cyst of popliteal space   . Cellulitis of left leg 10/11-16/2012  . CKD (chronic kidney disease)   . CHF (congestive heart failure)   . Heart murmur     Past Surgical History  Procedure Laterality Date  . Cholecystectomy    . Joint effusion      left knee  . Aortic valve replacement  01/23/2011    via minimally invasive approach per Dr Silvestre Mesi, [facility]    History  Smoking status  . Former Smoker  . Quit date: 01/11/1962  Smokeless tobacco  . Not on file    History  Alcohol Use No    Family History  Problem Relation Age of Onset  . Colon cancer    . Stroke    . Cancer Other     colon  . Stroke Other     Review of Systems: The review of systems is per the HPI.  All other systems were reviewed and are negative.  Physical Exam: BP 130/48  Pulse 72  Ht 5\' 10"  (1.778 m)  Wt 196 lb 12.8 oz (89.268 kg)  BMI 28.24 kg/m2  SpO2 99% Patient is very pleasant and in no acute distress. Looks fatigued today and like he does not feel well. Skin is warm and dry. Color is sallow.  HEENT is unremarkable. Normocephalic/atraumatic. PERRL. Sclera are nonicteric. Neck is supple. No masses. No JVD. Lungs are clear. Cardiac exam shows a regular rate and rhythm. Outflow murmur noted. Abdomen is soft. Extremities are full with edema. Gait and ROM are intact. No gross neurologic deficits noted.  Wt Readings from Last 3 Encounters:  12/01/12 196 lb 12.8 oz (89.268 kg)  11/11/12 196 lb (88.905 kg)  09/02/12 195 lb (88.451 kg)     LABORATORY DATA: PENDING  Lab Results  Component Value Date   WBC 3.0* 08/26/2012   HGB 10.4* 08/26/2012   HCT 31.6* 08/26/2012   PLT 54* 08/26/2012    GLUCOSE 150* 11/25/2012   CHOL 78 09/19/2011   TRIG 65.0 09/19/2011   HDL  35.60* 09/19/2011   LDLCALC 29 09/19/2011   ALT 42 07/23/2012   AST 47* 07/23/2012   NA 136 11/25/2012   K 3.6 11/25/2012   CL 106 11/25/2012   CREATININE 2.2* 11/25/2012   BUN 39* 11/25/2012   CO2 23 11/25/2012   TSH 5.92* 07/23/2012   INR 1.27 10/20/2010   HGBA1C 5.9 04/01/2012     Assessment / Plan: 1. Aortic valve disease with past AVR at [facility] - felt to be stable with recent check up with Dr. Excell Seltzer.   2. Chronic edema - felt to be from chronic venous insufficiency and diastolic HF - His EF is 60% per echo from 02/2101. His weight is stable. Continue to elevate legs and restrict salt  3. PAF - on very low dose amiodarone - in sinus on exam - does need a follow up TSH  4. Advanced age -   5. Fatigue - probably multifactorial - but he looks like he does not feel well today - probably has a viral illness. He would be quite susceptible given his issues. I am going to treat him with Doxycycline 100 mg for a week to take twice a day. Check labs today. Keep regular follow up.   Patient is agreeable to this plan and will call if any problems develop in the interim.   Rosalio Macadamia, RN, ANP-C [facility] [address] [place], [place]  [address]

## 2012-12-01 NOTE — Telephone Encounter (Signed)
S/w mark @ [phone] [facility] to correct script of Doxycyline (100 mg) bid for 7 days

## 2012-12-01 NOTE — Telephone Encounter (Signed)
I spoke with the pt and he complains of fatigue and weight gain.  The pt said he just does not feel well and would like to be seen today.  BP 124/50, pulse 75.  I have scheduled the pt to see Norma Fredrickson NP today at 2:00.

## 2012-12-01 NOTE — Telephone Encounter (Signed)
New problem   Pt has been feeling weak no energy and has gained 4 lb in a week.   Today pt feels very tired an about to give out.    Pt still has not gotten his lab results from last week.  Pt would like to come in today.

## 2012-12-01 NOTE — Telephone Encounter (Signed)
I attempted to reach the pt but is phone line is busy.  I have tentatively placed the pt on Lori's scheduled today.

## 2012-12-01 NOTE — Patient Instructions (Addendum)
We need to check labs today  I am going to give you a round of antibiotics - doxycycline 100 mg to take 2 times a day for a week  Stay on your current medicines   Call the [facility] office at [phone] if you have any questions, problems or concerns.

## 2012-12-02 ENCOUNTER — Telehealth: Payer: Self-pay | Admitting: Oncology

## 2012-12-02 NOTE — Telephone Encounter (Signed)
Made appt for pt per Norma Fredrickson PA , appt with ML on 12/1, nurse notified

## 2012-12-08 ENCOUNTER — Ambulatory Visit (HOSPITAL_COMMUNITY)
Admission: RE | Admit: 2012-12-08 | Discharge: 2012-12-08 | Disposition: A | Discharge status: Home / Self Care | Payer: Medicare Other | Source: Ambulatory Visit | Attending: Oncology | Admitting: Oncology

## 2012-12-08 ENCOUNTER — Telehealth: Payer: Self-pay | Admitting: Oncology

## 2012-12-08 ENCOUNTER — Telehealth: Payer: Self-pay | Admitting: *Deleted

## 2012-12-08 ENCOUNTER — Ambulatory Visit: Payer: Medicare Other | Admitting: Lab

## 2012-12-08 ENCOUNTER — Other Ambulatory Visit: Payer: Self-pay | Admitting: *Deleted

## 2012-12-08 ENCOUNTER — Ambulatory Visit (HOSPITAL_COMMUNITY)
Admission: RE | Admit: 2012-12-08 | Discharge: 2012-12-08 | Disposition: A | Discharge status: Home / Self Care | Payer: Medicare Other | Source: Ambulatory Visit | Attending: Nurse Practitioner | Admitting: Nurse Practitioner

## 2012-12-08 ENCOUNTER — Ambulatory Visit (HOSPITAL_BASED_OUTPATIENT_CLINIC_OR_DEPARTMENT_OTHER): Payer: Medicare Other | Admitting: Nurse Practitioner

## 2012-12-08 ENCOUNTER — Ambulatory Visit (HOSPITAL_BASED_OUTPATIENT_CLINIC_OR_DEPARTMENT_OTHER): Payer: Medicare Other | Admitting: Lab

## 2012-12-08 VITALS — BP 113/50 | HR 66 | Temp 97.6°F | Resp 17 | Ht 70.0 in | Wt 192.3 lb

## 2012-12-08 DIAGNOSIS — R161 Splenomegaly, not elsewhere classified: Secondary | ICD-10-CM

## 2012-12-08 DIAGNOSIS — Z954 Presence of other heart-valve replacement: Secondary | ICD-10-CM | POA: Insufficient documentation

## 2012-12-08 DIAGNOSIS — R0609 Other forms of dyspnea: Secondary | ICD-10-CM | POA: Insufficient documentation

## 2012-12-08 DIAGNOSIS — D696 Thrombocytopenia, unspecified: Secondary | ICD-10-CM | POA: Insufficient documentation

## 2012-12-08 DIAGNOSIS — D61818 Other pancytopenia: Secondary | ICD-10-CM | POA: Insufficient documentation

## 2012-12-08 DIAGNOSIS — I359 Nonrheumatic aortic valve disorder, unspecified: Secondary | ICD-10-CM

## 2012-12-08 DIAGNOSIS — D649 Anemia, unspecified: Secondary | ICD-10-CM

## 2012-12-08 DIAGNOSIS — R05 Cough: Secondary | ICD-10-CM

## 2012-12-08 DIAGNOSIS — E119 Type 2 diabetes mellitus without complications: Secondary | ICD-10-CM

## 2012-12-08 DIAGNOSIS — D72819 Decreased white blood cell count, unspecified: Secondary | ICD-10-CM

## 2012-12-08 DIAGNOSIS — R0989 Other specified symptoms and signs involving the circulatory and respiratory systems: Secondary | ICD-10-CM | POA: Insufficient documentation

## 2012-12-08 DIAGNOSIS — R0602 Shortness of breath: Secondary | ICD-10-CM

## 2012-12-08 DIAGNOSIS — R918 Other nonspecific abnormal finding of lung field: Secondary | ICD-10-CM

## 2012-12-08 DIAGNOSIS — R059 Cough, unspecified: Secondary | ICD-10-CM | POA: Insufficient documentation

## 2012-12-08 DIAGNOSIS — I4891 Unspecified atrial fibrillation: Secondary | ICD-10-CM

## 2012-12-08 LAB — CBC & DIFF AND RETIC
BASO%: 0 % (ref 0.0–2.0)
Basophils Absolute: 0 10*3/uL (ref 0.0–0.1)
EOS%: 0 % (ref 0.0–7.0)
HGB: 8.3 g/dL — ABNORMAL LOW (ref 13.0–17.1)
Immature Retic Fract: 6.7 % (ref 3.00–10.60)
MCH: 27.6 pg (ref 27.2–33.4)
MCHC: 32.5 g/dL (ref 32.0–36.0)
MONO#: 0.2 10*3/uL (ref 0.1–0.9)
NEUT%: 82.4 % — ABNORMAL HIGH (ref 39.0–75.0)
RBC: 3.01 10*6/uL — ABNORMAL LOW (ref 4.20–5.82)
Retic Ct Abs: 36.42 10*3/uL (ref 34.80–93.90)
WBC: 2.8 10*3/uL — ABNORMAL LOW (ref 4.0–10.3)
lymph#: 0.3 10*3/uL — ABNORMAL LOW (ref 0.9–3.3)
nRBC: 0 % (ref 0–0)

## 2012-12-08 LAB — COMPREHENSIVE METABOLIC PANEL (CC13)
Albumin: 2.8 g/dL — ABNORMAL LOW (ref 3.5–5.0)
Anion Gap: 10 mEq/L (ref 3–11)
BUN: 42.8 mg/dL — ABNORMAL HIGH (ref 7.0–26.0)
CO2: 18 mEq/L — ABNORMAL LOW (ref 22–29)
Calcium: 8.8 mg/dL (ref 8.4–10.4)
Chloride: 106 mEq/L (ref 98–109)
Creatinine: 2.1 mg/dL — ABNORMAL HIGH (ref 0.7–1.3)
Glucose: 109 mg/dl (ref 70–140)
Potassium: 4 mEq/L (ref 3.5–5.1)

## 2012-12-08 NOTE — Progress Notes (Addendum)
OFFICE PROGRESS NOTE  Interval history:  [name] is an 79 year old man with chronic pancytopenia. He is seen today at the request of cardiology for evaluation of progressive anemia.  He reports an approximate 2 week history of "feeling bad". He has had no fever or chills. He has had a few episodes of night sweats but none recent. Energy level is poor. Appetite is also poor. He is having intermittent headaches. He denies neck pain. He notes some light sensitivity. No visual disturbance. He feels short of breath and has had a cough with clear phlegm expectorated. He has mild nausea. No vomiting. Baseline bowel habits of periodic loose stools are unchanged. He has chronic leg swelling.  He completed approximately 5-1/2 days of a course of doxycycline with no improvement in his symptoms.   Objective: Blood pressure 113/50, pulse 66, temperature 97.6 F (36.4 C), temperature source Oral, resp. rate 17, height 5\' 10"  (1.778 m), weight 192 lb 4.8 oz (87.227 kg), SpO2 100.00%.  Question mild jaundice. No scleral icterus. No thrush or ulcerations. Inspiratory rales at the left lung base. Regular cardiac rhythm. Abdomen soft. Spleen palpable 10-12 cm below the left costal margin. Pitting leg edema. He is alert. Flat affect.  Lab Results: Lab Results  Component Value Date   WBC 2.3 Repeated and verified X2.* 12/01/2012   HGB 8.7 Repeated and verified X2.* 12/01/2012   HCT 25.9 Repeated and verified X2.* 12/01/2012   MCV 86.4 12/01/2012   PLT 65.0* 12/01/2012    Chemistry:    Chemistry      Component Value Date/Time   NA 134* 12/01/2012 1431   K 3.7 12/01/2012 1431   CL 104 12/01/2012 1431   CO2 22 12/01/2012 1431   BUN 34* 12/01/2012 1431   CREATININE 2.2* 12/01/2012 1431      Component Value Date/Time   CALCIUM 8.6 12/01/2012 1431   ALKPHOS 68 07/23/2012 1612   AST 47* 07/23/2012 1612   ALT 42 07/23/2012 1612   BILITOT 0.6 07/23/2012 1612       Studies/Results: No results  found.  Medications: I have reviewed the patient's current medications.  Assessment/Plan:  1. Chronic pancytopenia-the thrombocytopenia responded to Nplate therapy prior to aortic valve replacement surgery. 2. History of splenomegaly. Spleen palpable on exam today. 3. History of atrial fibrillation.  4. Admission with a left lower extremity cellulitis in June 2009.  5. History of "Hot flashes," etiology unclear.  6. Diabetes.  7. Severe aortic stenosis-he underwent aortic valve replacement surgery at [facility] on 01/23/2011. 8. 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 9. Episode of gout at the right great toe following the aortic valve replacement. 10. History of bradycardia.  Disposition-[name] was found to have progressive anemia on recent labs. Not clear that this explains the current symptoms he is experiencing. Question if he has an infection.  He will return to the lab today for a repeat CBC, chemistry panel, LDH and reticulocyte count. We will also obtain a type and hold with plans to transfuse 2 units of blood if the hemoglobin is lower. We will obtain a chest x-ray due to the abnormal lung exam, cough and shortness of breath.  We will contact him once results are available. He will return for a followup visit in approximately one  week.   Patient seen with Dr. [name]   30 minutes were spent face-to-face at today's visit with the majority of that time involved in counseling/coordination of care.  Lonna Cobb ANP/GNP-BC    This was a shared visit with Lonna Cobb. [name] was interviewed and examined. He has developed malaise and progressive anemia. It is unclear  whether his symptoms are related to anemia or another systemic process. We will obtain additional evaluation to include a chest x-ray and repeat laboratory studies. He will be transfused with packed red blood cells if the hemoglobin is lower. He will return for an office visit in one week.  Mancel Bale, M.D.

## 2012-12-08 NOTE — Telephone Encounter (Signed)
Gave pt appt for lab and ML , sent pt to labs today and x-ray, emailed Michelle regarding PRBC this week

## 2012-12-08 NOTE — Addendum Note (Signed)
Addended by: Thornton Papas B on: 12/08/2012 06:16 PM   Modules accepted: Level of Service

## 2012-12-08 NOTE — Telephone Encounter (Signed)
Per staff message and POF I have scheduled appts.  JMW  

## 2012-12-09 ENCOUNTER — Other Ambulatory Visit: Payer: Self-pay | Admitting: *Deleted

## 2012-12-09 ENCOUNTER — Other Ambulatory Visit: Payer: Self-pay | Admitting: Nurse Practitioner

## 2012-12-09 ENCOUNTER — Telehealth: Payer: Self-pay | Admitting: Oncology

## 2012-12-09 DIAGNOSIS — D649 Anemia, unspecified: Secondary | ICD-10-CM

## 2012-12-09 LAB — HOLD TUBE, BLOOD BANK

## 2012-12-09 NOTE — Telephone Encounter (Signed)
Gave pt's wife gave her appt for  12/4

## 2012-12-10 ENCOUNTER — Telehealth: Payer: Self-pay | Admitting: *Deleted

## 2012-12-10 NOTE — Telephone Encounter (Signed)
Call from pt's wife requesting Chest Xray results. Did not show pneumonia, per Lonna Cobb, NP. Mrs. Lofaso voiced understanding. Transfusion appt confirmed.

## 2012-12-11 ENCOUNTER — Ambulatory Visit (HOSPITAL_BASED_OUTPATIENT_CLINIC_OR_DEPARTMENT_OTHER): Payer: Medicare Other

## 2012-12-11 VITALS — BP 140/51 | HR 68 | Temp 100.0°F | Resp 18

## 2012-12-11 DIAGNOSIS — D649 Anemia, unspecified: Secondary | ICD-10-CM

## 2012-12-11 MED ORDER — SODIUM CHLORIDE 0.9 % IV SOLN
250.0000 mL | Freq: Once | INTRAVENOUS | Status: AC
  Administered 2012-12-11: 250 mL via INTRAVENOUS

## 2012-12-11 NOTE — Patient Instructions (Signed)
Blood Transfusion Information WHAT IS A BLOOD TRANSFUSION? A transfusion is the replacement of blood or some of its parts. Blood is made up of multiple cells which provide different functions.  Red blood cells carry oxygen and are used for blood loss replacement.  White blood cells fight against infection.  Platelets control bleeding.  Plasma helps clot blood.  Other blood products are available for specialized needs, such as hemophilia or other clotting disorders. BEFORE THE TRANSFUSION  Who gives blood for transfusions?   You may be able to donate blood to be used at a later date on yourself (autologous donation).  Relatives can be asked to donate blood. This is generally not any safer than if you have received blood from a stranger. The same precautions are taken to ensure safety when a relative's blood is donated.  Healthy volunteers who are fully evaluated to make sure their blood is safe. This is blood bank blood. Transfusion therapy is the safest it has ever been in the practice of medicine. Before blood is taken from a donor, a complete history is taken to make sure that person has no history of diseases nor engages in risky social behavior (examples are intravenous drug use or sexual activity with multiple partners). The donor's travel history is screened to minimize risk of transmitting infections, such as malaria. The donated blood is tested for signs of infectious diseases, such as HIV and hepatitis. The blood is then tested to be sure it is compatible with you in order to minimize the chance of a transfusion reaction. If you or a relative donates blood, this is often done in anticipation of surgery and is not appropriate for emergency situations. It takes many days to process the donated blood. RISKS AND COMPLICATIONS Although transfusion therapy is very safe and saves many lives, the main dangers of transfusion include:   Getting an infectious disease.  Developing a  transfusion reaction. This is an allergic reaction to something in the blood you were given. Every precaution is taken to prevent this. The decision to have a blood transfusion has been considered carefully by your caregiver before blood is given. Blood is not given unless the benefits outweigh the risks. AFTER THE TRANSFUSION  Right after receiving a blood transfusion, you will usually feel much better and more energetic. This is especially true if your red blood cells have gotten low (anemic). The transfusion raises the level of the red blood cells which carry oxygen, and this usually causes an energy increase.  The nurse administering the transfusion will monitor you carefully for complications. HOME CARE INSTRUCTIONS  No special instructions are needed after a transfusion. You may find your energy is better. Speak with your caregiver about any limitations on activity for underlying diseases you may have. SEEK MEDICAL CARE IF:   Your condition is not improving after your transfusion.  You develop redness or irritation at the intravenous (IV) site. SEEK IMMEDIATE MEDICAL CARE IF:  Any of the following symptoms occur over the next 12 hours:  Shaking chills.  You have a temperature by mouth above 102 F (38.9 C), not controlled by medicine.  Chest, back, or muscle pain.  People around you feel you are not acting correctly or are confused.  Shortness of breath or difficulty breathing.  Dizziness and fainting.  You get a rash or develop hives.  You have a decrease in urine output.  Your urine turns a dark color or changes to pink, red, or brown. Any of the following   symptoms occur over the next 10 days:  You have a temperature by mouth above 102 F (38.9 C), not controlled by medicine.  Shortness of breath.  Weakness after normal activity.  The white part of the eye turns yellow (jaundice).  You have a decrease in the amount of urine or are urinating less often.  Your  urine turns a dark color or changes to pink, red, or brown. Document Released: 12/23/1999 Document Revised: 03/19/2011 Document Reviewed: 08/11/2007 [employer] Patient Information 2014 [employer].  

## 2012-12-11 NOTE — Progress Notes (Signed)
1535- informed NP of low grade temperature. Okay to continue transfusion  1757-transfusion completed. Patient and wife instructed to check temp when arriving home and during the night, voices understanding.   [name] left bag in visitor seat, attempted to call, unable to reach patient at either number in chart. Bag left at nurses station.

## 2012-12-12 ENCOUNTER — Other Ambulatory Visit: Payer: Self-pay

## 2012-12-12 ENCOUNTER — Ambulatory Visit (INDEPENDENT_AMBULATORY_CARE_PROVIDER_SITE_OTHER): Payer: Medicare Other | Admitting: Internal Medicine

## 2012-12-12 ENCOUNTER — Inpatient Hospital Stay (HOSPITAL_COMMUNITY)
Admission: EM | Admit: 2012-12-12 | Discharge: 2012-12-17 | DRG: 947 | Disposition: A | Discharge status: Home Health | Payer: Medicare Other | Attending: Internal Medicine | Admitting: Internal Medicine

## 2012-12-12 ENCOUNTER — Emergency Department (HOSPITAL_COMMUNITY): Payer: Medicare Other

## 2012-12-12 ENCOUNTER — Encounter: Payer: Self-pay | Admitting: Internal Medicine

## 2012-12-12 ENCOUNTER — Encounter (HOSPITAL_COMMUNITY): Payer: Self-pay | Admitting: Emergency Medicine

## 2012-12-12 VITALS — BP 122/55 | HR 67 | Temp 98.5°F | Wt 189.8 lb

## 2012-12-12 DIAGNOSIS — Z954 Presence of other heart-valve replacement: Secondary | ICD-10-CM

## 2012-12-12 DIAGNOSIS — R0989 Other specified symptoms and signs involving the circulatory and respiratory systems: Secondary | ICD-10-CM

## 2012-12-12 DIAGNOSIS — B955 Unspecified streptococcus as the cause of diseases classified elsewhere: Secondary | ICD-10-CM

## 2012-12-12 DIAGNOSIS — Z8673 Personal history of transient ischemic attack (TIA), and cerebral infarction without residual deficits: Secondary | ICD-10-CM

## 2012-12-12 DIAGNOSIS — E43 Unspecified severe protein-calorie malnutrition: Secondary | ICD-10-CM

## 2012-12-12 DIAGNOSIS — I359 Nonrheumatic aortic valve disorder, unspecified: Secondary | ICD-10-CM

## 2012-12-12 DIAGNOSIS — N183 Chronic kidney disease, stage 3 unspecified: Secondary | ICD-10-CM | POA: Diagnosis present

## 2012-12-12 DIAGNOSIS — I129 Hypertensive chronic kidney disease with stage 1 through stage 4 chronic kidney disease, or unspecified chronic kidney disease: Secondary | ICD-10-CM | POA: Diagnosis present

## 2012-12-12 DIAGNOSIS — R21 Rash and other nonspecific skin eruption: Secondary | ICD-10-CM | POA: Diagnosis present

## 2012-12-12 DIAGNOSIS — R161 Splenomegaly, not elsewhere classified: Secondary | ICD-10-CM

## 2012-12-12 DIAGNOSIS — E118 Type 2 diabetes mellitus with unspecified complications: Secondary | ICD-10-CM | POA: Diagnosis present

## 2012-12-12 DIAGNOSIS — D61818 Other pancytopenia: Secondary | ICD-10-CM

## 2012-12-12 DIAGNOSIS — R5381 Other malaise: Secondary | ICD-10-CM

## 2012-12-12 DIAGNOSIS — N289 Disorder of kidney and ureter, unspecified: Secondary | ICD-10-CM

## 2012-12-12 DIAGNOSIS — C140 Malignant neoplasm of pharynx, unspecified: Secondary | ICD-10-CM | POA: Diagnosis present

## 2012-12-12 DIAGNOSIS — I509 Heart failure, unspecified: Secondary | ICD-10-CM | POA: Diagnosis present

## 2012-12-12 DIAGNOSIS — M109 Gout, unspecified: Secondary | ICD-10-CM | POA: Diagnosis present

## 2012-12-12 DIAGNOSIS — E039 Hypothyroidism, unspecified: Secondary | ICD-10-CM

## 2012-12-12 DIAGNOSIS — E119 Type 2 diabetes mellitus without complications: Secondary | ICD-10-CM

## 2012-12-12 DIAGNOSIS — Z800 Family history of malignant neoplasm of digestive organs: Secondary | ICD-10-CM

## 2012-12-12 DIAGNOSIS — D649 Anemia, unspecified: Secondary | ICD-10-CM

## 2012-12-12 DIAGNOSIS — R0689 Other abnormalities of breathing: Secondary | ICD-10-CM

## 2012-12-12 DIAGNOSIS — I10 Essential (primary) hypertension: Secondary | ICD-10-CM

## 2012-12-12 DIAGNOSIS — I4891 Unspecified atrial fibrillation: Secondary | ICD-10-CM

## 2012-12-12 DIAGNOSIS — R531 Weakness: Secondary | ICD-10-CM | POA: Diagnosis present

## 2012-12-12 DIAGNOSIS — D72819 Decreased white blood cell count, unspecified: Secondary | ICD-10-CM

## 2012-12-12 DIAGNOSIS — R5383 Other fatigue: Secondary | ICD-10-CM

## 2012-12-12 DIAGNOSIS — K219 Gastro-esophageal reflux disease without esophagitis: Secondary | ICD-10-CM

## 2012-12-12 DIAGNOSIS — F411 Generalized anxiety disorder: Secondary | ICD-10-CM | POA: Diagnosis present

## 2012-12-12 DIAGNOSIS — D693 Immune thrombocytopenic purpura: Secondary | ICD-10-CM

## 2012-12-12 DIAGNOSIS — I5032 Chronic diastolic (congestive) heart failure: Secondary | ICD-10-CM | POA: Diagnosis present

## 2012-12-12 DIAGNOSIS — Z7982 Long term (current) use of aspirin: Secondary | ICD-10-CM

## 2012-12-12 DIAGNOSIS — Z952 Presence of prosthetic heart valve: Secondary | ICD-10-CM

## 2012-12-12 DIAGNOSIS — I330 Acute and subacute infective endocarditis: Secondary | ICD-10-CM

## 2012-12-12 DIAGNOSIS — T826XXA Infection and inflammatory reaction due to cardiac valve prosthesis, initial encounter: Secondary | ICD-10-CM

## 2012-12-12 DIAGNOSIS — Z87891 Personal history of nicotine dependence: Secondary | ICD-10-CM

## 2012-12-12 DIAGNOSIS — Z79899 Other long term (current) drug therapy: Secondary | ICD-10-CM

## 2012-12-12 DIAGNOSIS — B952 Enterococcus as the cause of diseases classified elsewhere: Secondary | ICD-10-CM | POA: Diagnosis present

## 2012-12-12 DIAGNOSIS — R51 Headache: Secondary | ICD-10-CM

## 2012-12-12 DIAGNOSIS — I2510 Atherosclerotic heart disease of native coronary artery without angina pectoris: Secondary | ICD-10-CM

## 2012-12-12 DIAGNOSIS — Z6826 Body mass index (BMI) 26.0-26.9, adult: Secondary | ICD-10-CM

## 2012-12-12 DIAGNOSIS — D696 Thrombocytopenia, unspecified: Secondary | ICD-10-CM

## 2012-12-12 LAB — COMPREHENSIVE METABOLIC PANEL
Albumin: 3.1 g/dL — ABNORMAL LOW (ref 3.5–5.2)
Alkaline Phosphatase: 98 U/L (ref 39–117)
BUN: 39 mg/dL — ABNORMAL HIGH (ref 6–23)
Chloride: 102 mEq/L (ref 96–112)
Creatinine, Ser: 1.97 mg/dL — ABNORMAL HIGH (ref 0.50–1.35)
GFR calc Af Amer: 33 mL/min — ABNORMAL LOW (ref >90)
GFR calc non Af Amer: 29 mL/min — ABNORMAL LOW (ref >90)
Glucose, Bld: 120 mg/dL — ABNORMAL HIGH (ref 70–99)
Total Bilirubin: 0.7 mg/dL (ref 0.3–1.2)
Total Protein: 7.2 g/dL (ref 6.0–8.3)

## 2012-12-12 LAB — RETICULOCYTES: Retic Count, Absolute: 32.6 10*3/uL (ref 19.0–186.0)

## 2012-12-12 LAB — CBC WITH DIFFERENTIAL/PLATELET
Basophils Relative: 0 % (ref 0–1)
Eosinophils Absolute: 0 10*3/uL (ref 0.0–0.7)
HCT: 34.4 % — ABNORMAL LOW (ref 39.0–52.0)
Hemoglobin: 11.6 g/dL — ABNORMAL LOW (ref 13.0–17.0)
Lymphs Abs: 0.5 10*3/uL — ABNORMAL LOW (ref 0.7–4.0)
MCH: 28.6 pg (ref 26.0–34.0)
MCHC: 33.7 g/dL (ref 30.0–36.0)
MCV: 84.7 fL (ref 78.0–100.0)
Monocytes Absolute: 0.3 10*3/uL (ref 0.1–1.0)
Monocytes Relative: 7 % (ref 3–12)
Neutro Abs: 3.5 10*3/uL (ref 1.7–7.7)
RBC: 4.06 MIL/uL — ABNORMAL LOW (ref 4.22–5.81)

## 2012-12-12 LAB — TYPE AND SCREEN
ABO/RH(D): A POS
Unit division: 0

## 2012-12-12 LAB — LIPASE, BLOOD: Lipase: 27 U/L (ref 11–59)

## 2012-12-12 LAB — POCT I-STAT TROPONIN I: Troponin i, poc: 0.06 ng/mL (ref 0.00–0.08)

## 2012-12-12 LAB — URINALYSIS, ROUTINE W REFLEX MICROSCOPIC
Bilirubin Urine: NEGATIVE
Hgb urine dipstick: NEGATIVE
Ketones, ur: NEGATIVE mg/dL
Leukocytes, UA: NEGATIVE
Nitrite: NEGATIVE
Protein, ur: NEGATIVE mg/dL
Specific Gravity, Urine: 1.01 (ref 1.005–1.030)
Urobilinogen, UA: 0.2 mg/dL (ref 0.0–1.0)
pH: 5 (ref 5.0–8.0)

## 2012-12-12 LAB — LACTATE DEHYDROGENASE: LDH: 193 U/L (ref 94–250)

## 2012-12-12 LAB — HAPTOGLOBIN: Haptoglobin: 100 mg/dL (ref 45–215)

## 2012-12-12 MED ORDER — LORAZEPAM 0.5 MG PO TABS
0.5000 mg | ORAL_TABLET | Freq: Four times a day (QID) | ORAL | Status: DC | PRN
  Administered 2012-12-13 – 2012-12-17 (×3): 0.5 mg via ORAL
  Filled 2012-12-12 (×3): qty 1

## 2012-12-12 MED ORDER — AMIODARONE HCL 100 MG PO TABS
100.0000 mg | ORAL_TABLET | Freq: Every day | ORAL | Status: DC
  Administered 2012-12-13 – 2012-12-17 (×5): 100 mg via ORAL
  Filled 2012-12-12 (×6): qty 1

## 2012-12-12 MED ORDER — HYDRALAZINE HCL 50 MG PO TABS
50.0000 mg | ORAL_TABLET | Freq: Three times a day (TID) | ORAL | Status: DC
  Administered 2012-12-13 – 2012-12-16 (×8): 50 mg via ORAL
  Filled 2012-12-12 (×16): qty 1

## 2012-12-12 MED ORDER — ALLOPURINOL 100 MG PO TABS
100.0000 mg | ORAL_TABLET | Freq: Every day | ORAL | Status: DC
  Administered 2012-12-13 – 2012-12-17 (×5): 100 mg via ORAL
  Filled 2012-12-12 (×6): qty 1

## 2012-12-12 MED ORDER — ONDANSETRON HCL 4 MG PO TABS: 4.0000 mg | ORAL_TABLET | Freq: Four times a day (QID) | ORAL | Status: DC | PRN

## 2012-12-12 MED ORDER — LEVOTHYROXINE SODIUM 25 MCG PO TABS
25.0000 ug | ORAL_TABLET | Freq: Every day | ORAL | Status: DC
  Administered 2012-12-13 – 2012-12-17 (×4): 25 ug via ORAL
  Filled 2012-12-12 (×6): qty 1

## 2012-12-12 MED ORDER — SODIUM CHLORIDE 0.9 % IJ SOLN
3.0000 mL | Freq: Two times a day (BID) | INTRAMUSCULAR | Status: DC
  Administered 2012-12-13 – 2012-12-17 (×10): 3 mL via INTRAVENOUS

## 2012-12-12 MED ORDER — ONDANSETRON HCL 4 MG/2ML IJ SOLN: 4.0000 mg | Freq: Four times a day (QID) | INTRAMUSCULAR | Status: DC | PRN

## 2012-12-12 MED ORDER — FUROSEMIDE 80 MG PO TABS
80.0000 mg | ORAL_TABLET | Freq: Every day | ORAL | Status: DC
  Administered 2012-12-13 – 2012-12-17 (×5): 80 mg via ORAL
  Filled 2012-12-12 (×6): qty 1

## 2012-12-12 MED ORDER — POTASSIUM CHLORIDE ER 10 MEQ PO TBCR
10.0000 meq | EXTENDED_RELEASE_TABLET | Freq: Every day | ORAL | Status: DC
  Administered 2012-12-13 – 2012-12-17 (×5): 10 meq via ORAL
  Filled 2012-12-12 (×6): qty 1

## 2012-12-12 MED ORDER — PANTOPRAZOLE SODIUM 40 MG PO TBEC
40.0000 mg | DELAYED_RELEASE_TABLET | Freq: Every day | ORAL | Status: DC
  Administered 2012-12-13 – 2012-12-17 (×5): 40 mg via ORAL
  Filled 2012-12-12 (×4): qty 1

## 2012-12-12 MED ORDER — ASPIRIN 325 MG PO TABS
325.0000 mg | ORAL_TABLET | Freq: Every day | ORAL | Status: DC
  Administered 2012-12-13 – 2012-12-17 (×5): 325 mg via ORAL
  Filled 2012-12-12 (×6): qty 1

## 2012-12-12 MED ORDER — AMLODIPINE BESYLATE 5 MG PO TABS
5.0000 mg | ORAL_TABLET | Freq: Every day | ORAL | Status: DC
  Administered 2012-12-13 – 2012-12-17 (×5): 5 mg via ORAL
  Filled 2012-12-12 (×5): qty 1

## 2012-12-12 MED ORDER — METOPROLOL TARTRATE 12.5 MG HALF TABLET
12.5000 mg | ORAL_TABLET | Freq: Every day | ORAL | Status: DC
  Administered 2012-12-13 – 2012-12-17 (×5): 12.5 mg via ORAL
  Filled 2012-12-12 (×6): qty 1

## 2012-12-12 NOTE — Progress Notes (Signed)
Subjective:    Patient ID: [name], male    DOB: [birth year], 79 y.o.   MRN: [id number]  HPI  He complains of profound fatigue, malaise & anorexia ; he received 2 units of packed cells 12/11/12 for pancytopenia. His white count was 2300, hematocrit 25.9, and platelet count 65,000.  Chest x-ray 12/08/12 was reviewed. This revealed no acute infiltrate. It did reveal dense calcium deposits in the left hilum suggesting remote granulomatous disease  He's also had night sweats without definite fever or chills.  He describes black spots in his vision. He's been having constant dull occipital/neck discomfort in past 2 weeks. This is aggravated by light. It does improve at night    Review of Systems He has not had constipation, diarrhea, melena, or rectal bleeding.  He describes some shortness of breath and intermittent sputum production with clear phlegm.  He has intermittent loose/watery stool without frank diarrhea  He has no dysuria, pyuria, or hematuria.  His edema is unchanged.    Objective:   Physical Exam  Gen.: profoundly fatigued ; minimal interaction. History is mainly from his daughter and wife. Head: Normocephalic without obvious abnormalities; pattern alopecia  Eyes: No corneal or conjunctival inflammation noted. No scleral icterus   Mouth: Oral mucosa and oropharynx reveal no lesions or exudates. Denture in good repair. Neck: No deformities, masses, or tenderness noted. No meningismus present. Lungs: Normal respiratory effort; rales noted at the left base Heart: Normal rate and rhythm. Normal S1 and S2. No gallop, click, or rub. Grade 1.5 systolic murmur Abdomen: Bowel sounds normal; abdomen soft and nontender. Thyromegaly suggested on the left with tenderness to palpation                                   Musculoskeletal/extremities:  Tense edema of the lower extremities. Decreased tone & strength generally. Negative straight leg raising bilaterally, again with no  meningeal signs Hand joints normal OR reveal mild  DJD DIP changes. Fingernail / toenail health good. Able to lie down & sit up w/o help. Negative SLR bilaterally Vascular: Carotid, radial artery, dorsalis pedis and  posterior tibial pulses are full and equal. Epigastric bruit present. Neurologic: withdrawn but responsive when addressed directly     Skin: Intact without suspicious lesions or rashes. Hyperpigmentation from chronic bruising especially of upper arms Lymph: No cervical, axillary lymphadenopathy present. Psych: Mood and affect are normal. Normally interactive                                                                                        Assessment & Plan:  #1 fatigue and malaise the context of profound pancytopenia  #2 occipital headache with photosensitivity. No meningeal signs. The improvement overnight mitigates against a CNS bleed in the context of thrombocytopenia  #3 cough and sputum production with associated abnormal breath sounds (rales) in the left lower lobe. Rule out atypical community-acquired pneumonia  #4 splenomegaly; this could be associated with nonfunctioning spleen and increased risk of infection with encapsulated organisms Plan : I would recommend evaluation in the emergency room to  rule out community-acquired pneumonia in the left lower lobe. Additionally blood count check to rule out progressive pancytopenia.

## 2012-12-12 NOTE — Progress Notes (Signed)
Attempted to call report. No answer.  Will attempt a call back in a few minutes.

## 2012-12-12 NOTE — ED Provider Notes (Signed)
Elderly - weakness and cough X 3 weeks - abx without improvement - blood tx yesterday - still weak and run down- non productive cough.  During tx, states had a fever  Exam - appears diffusely weak, no focality, mild cough, clear lungs, murmur present (history of aortic valve replacement 2 years ago).  No lower extremity edema of any significance, clear oropharynx though he does appear mildly dehydrated. No tachycardia, normal lung sounds.  Patient will need laboratory evaluation, likely admission to the hospital though no definite etiology of the patient's symptoms exists on physical exam or history.  I saw and evaluated the patient, reviewed the resident's note and I agree with the findings and plan.   I personally interpreted the EKG as well as the resident and agree with the interpretation on the resident's chart.  ED ECG REPORT  I personally interpreted this EKG   Date: 12/12/2012   Rate: 69  Rhythm: normal sinus rhythm  QRS Axis: left  Intervals: normal  ST/T Wave abnormalities: normal  Conduction Disutrbances:none  Narrative Interpretation:   Old EKG Reviewed: none available   Vida Roller, MD 12/12/12 2250

## 2012-12-12 NOTE — ED Notes (Addendum)
Patient transported to XR. 

## 2012-12-12 NOTE — ED Notes (Signed)
Per pt sts for the past 2 weeks he has been feeling bad. Cough, fever, chills, weakness. Sent here by PCP to rule out PNA. Pt very weak at triage and making effort to talk.received 2 units of blood yesterday due to pancytoppenia.

## 2012-12-12 NOTE — ED Notes (Signed)
Patient requested and received a cup of Ice Water.

## 2012-12-12 NOTE — Patient Instructions (Signed)
Share results with ER staff .

## 2012-12-12 NOTE — H&P (Signed)
[facility] History and Physical  Patient: [name]  ZOX:[id number]  DOB: 03/15/1922  DOS: the patient was seen and examined on 12/12/2012 PCP: [name], MD  Chief Complaint: Fatigue and lethargy  HPI: [name] is a 79 y.o. male with Past medical history of aortic wall replacement, atrial fibrillation, CVA, hypertension, diabetes mellitus, throat cancer, hypothyroidism, splenomegaly. The patient is coming from home. The patient presents with complaints of 3-4 weeks of not feeling well and fatigue. He mentions that he has chronic pancytopenia and has multiple bone marrow biopsies and they have not been able to identify the cause. Since last 3 weeks he has been having fatigue with tiredness and dyspnea on exertion. He denies any fever or chills or chest pain or orthopnea, PND, leg swelling he denies any diarrhea or black color bowel movements . he also denies any nausea or vomiting. He chief 2 weeks ago his PCP gave him some antibiotics but other than that there is no change in his medication and he is compliant with all his medication. Since recently he was found that his blood count was in eights, he was referred to a hematologist we'll give him 2 units of blood transfusion yesterday. He mentions that he has cough with clear expectoration without any blood. He initially denied any rash or bug bite, but later on examination he was Found to have upper body rash present he does not remember seeing them before today.  4 weeks ago he mentions that he has been able to chop wood for fire. At which time he also denies any bites from bug. He complains that he has been seeing black spots flying around his eyes. He also complains of some pain on the left upper quadrant that has been noted since last 2 weeks  Review of Systems: as mentioned in the history of present illness.  A Comprehensive review of the other systems is negative.  Past Medical History  Diagnosis Date  . Stroke    . Atrial fibrillation     amiodarone therapy; not a candidate for anticoagulation  . Bradycardia   . Aortic stenosis     echo 10/12: EF 65-70%, grade 2 diast dysfxn, severe AS, mean gradient 60 mmHg, mod LAE, PASP 47;  s/p minimally invasive tissue AVR with Dr. Silvestre Mesi at [facility] 01/2011 (pre-AVR cath with no obs CAD)  . Hypertension   . Diabetes mellitus   . Anemia   . Action tremor   . Thrombocytopenia     Dr. [name]  . Throat cancer     s/p resection  . Colon polyp   . Anxiety   . Splenomegaly   . Gout   . GERD (gastroesophageal reflux disease)   . Hypothyroidism   . Leukopenia     Chronic pancytopenia  . Degenerative disc disease   . Joint effusion, knee     left knee  . Synovial cyst of popliteal space   . Cellulitis of left leg 10/11-16/2012  . CKD (chronic kidney disease)   . CHF (congestive heart failure)   . Heart murmur    Past Surgical History  Procedure Laterality Date  . Cholecystectomy    . Joint effusion      left knee  . Aortic valve replacement  01/23/2011    via minimally invasive approach per Dr Silvestre Mesi, [facility]   Social History:  reports that he quit smoking about 50 years ago. He does not have any smokeless tobacco history on file. He reports that  he does not drink alcohol or use illicit drugs. Independent for most of his  ADL.  Allergies  Allergen Reactions  . Penicillins     Does not remember reaction (~year 1950)  . Neomycin-Bacitracin Zn-Polymyx     ? Reaction (thinks he remembers redness)    Family History  Problem Relation Age of Onset  . Colon cancer    . Stroke    . Cancer Other     colon  . Stroke Other     Prior to Admission medications   Medication Sig Start Date End Date Taking? Authorizing Provider  allopurinol (ZYLOPRIM) 100 MG tablet Take 1 tablet (100 mg total) by mouth daily. 11/11/12  Yes Tonny Bollman, MD  amiodarone (PACERONE) 200 MG tablet Take 0.5 tablets (100 mg total) by mouth daily. 09/02/12  Yes Tonny Bollman, MD   amLODipine (NORVASC) 5 MG tablet Take 1 tablet (5 mg total) by mouth daily. 11/11/12  Yes Tonny Bollman, MD  aspirin 325 MG tablet Take 325 mg by mouth daily.     Yes Historical Provider, MD  Cholecalciferol (VITAMIN D) 1000 UNITS capsule Take 1 capsule (1,000 Units total) by mouth daily. 01/05/11  Yes Gaylord Shih, MD  colchicine 0.6 MG tablet Take 0.6 mg by mouth as needed. Take one tablet by mouth daily during an acute episode of gout. 04/03/11  Yes Gaylord Shih, MD  fish oil-omega-3 fatty acids 1000 MG capsule Take 1 g by mouth daily.     Yes Historical Provider, MD  Flaxseed, Linseed, (FLAX SEED OIL) 1000 MG CAPS Take 1 capsule by mouth daily.    Yes Historical Provider, MD  furosemide (LASIX) 40 MG tablet Take 2 tablets (80 mg total) by mouth daily. 11/11/12  Yes Tonny Bollman, MD  hydrALAZINE (APRESOLINE) 50 MG tablet Take 1 tablet (50 mg total) by mouth 3 (three) times daily. 01/04/12 01/03/13 Yes Gaylord Shih, MD  levothyroxine (LEVOTHROID) 25 MCG tablet Take 1 tablet (25 mcg total) by mouth daily. 10/10/12 10/10/13 Yes Tonny Bollman, MD  LORazepam (ATIVAN) 0.5 MG tablet Take 0.5 mg by mouth every 6 (six) hours as needed. For nerves/tremors 04/26/11  Yes Pecola Lawless, MD  metoprolol tartrate (LOPRESSOR) 25 MG tablet Take 0.5 tablets (12.5 mg total) by mouth daily. 11/25/12  Yes Tonny Bollman, MD  Multiple Vitamin (MULTIVITAMIN) tablet Take 1 tablet by mouth daily.     Yes Historical Provider, MD  omeprazole (PRILOSEC) 20 MG capsule Take 1 capsule (20 mg total) by mouth daily. 06/19/12  Yes Peter M [place], MD  potassium chloride (K-DUR) 10 MEQ tablet Take 1 tablet (10 mEq total) by mouth daily. 09/16/12  Yes Tonny Bollman, MD  vitamin C (ASCORBIC ACID) 500 MG tablet Take 1 tablet (500 mg total) by mouth daily. 01/05/11  Yes Gaylord Shih, MD    Physical Exam: Filed Vitals:   12/12/12 2100 12/12/12 2115 12/12/12 2130 12/12/12 2222  BP: 124/53 121/55 117/54 115/56  Pulse: 68 71 72 75   Temp:      TempSrc:      Resp: 15 14 15 16   Height:      Weight:      SpO2: 99% 97% 99% 97%    General: Alert, Awake and Oriented to Time, Place and Person. Appear in moderate distress Eyes: PERRL ENT: Oral Mucosa clear moist. Neck:  minimal JVD Cardiovascular: S1 and S2 Present,  no  Murmur, Peripheral Pulses Present Respiratory: Bilateral Air entry equal and Decreased, bilateral basal  Crackles, no wheezes Abdomen: Bowel Sound Present, Soft and minimally tender in the left upper quadrant without any guarding or rigidity  Skin:  no Rash Extremities:  trace  Pedal edema, No  calf tenderness Neurologic: Grossly Unremarkable.  Labs on Admission:  CBC:  Recent Labs Lab 12/08/12 1320 12/12/12 1730  WBC 2.8* 4.3  NEUTROABS 2.3 3.5  HGB 8.3* 11.6*  HCT 25.5* 34.4*  MCV 84.7 84.7  PLT 49* 50*    CMP     Component Value Date/Time   NA 136 12/12/2012 1730   NA 134* 12/08/2012 1320   K 3.8 12/12/2012 1730   K 4.0 12/08/2012 1320   CL 102 12/12/2012 1730   CO2 20 12/12/2012 1730   CO2 18* 12/08/2012 1320   GLUCOSE 120* 12/12/2012 1730   GLUCOSE 109 12/08/2012 1320   BUN 39* 12/12/2012 1730   BUN 42.8* 12/08/2012 1320   CREATININE 1.97* 12/12/2012 1730   CREATININE 2.1* 12/08/2012 1320   CALCIUM 8.8 12/12/2012 1730   CALCIUM 8.8 12/08/2012 1320   PROT 7.2 12/12/2012 1730   PROT 6.1* 12/08/2012 1320   ALBUMIN 3.1* 12/12/2012 1730   ALBUMIN 2.8* 12/08/2012 1320   AST 42* 12/12/2012 1730   AST 31 12/08/2012 1320   ALT 31 12/12/2012 1730   ALT 25 12/08/2012 1320   ALKPHOS 98 12/12/2012 1730   ALKPHOS 68 12/08/2012 1320   BILITOT 0.7 12/12/2012 1730   BILITOT 0.65 12/08/2012 1320   GFRNONAA 29* 12/12/2012 1730   GFRAA 33* 12/12/2012 1730     Recent Labs Lab 12/12/12 1730  LIPASE 27   No results found for this basename: AMMONIA,  in the last 168 hours  No results found for this basename: CKTOTAL, CKMB, CKMBINDEX, TROPONINI,  in the last 168 hours BNP (last 3 results)  Recent Labs   02/14/12 1729  PROBNP 404.0*    Radiological Exams on Admission: Dg Chest 2 View  12/12/2012   CLINICAL DATA:  Weakness.  EXAM: CHEST  2 VIEW  COMPARISON:  12/08/2012.  FINDINGS: Mildly enlarged cardiac silhouette. Clear lungs. Stable tiny calcified granulomata on the left. Stable calcified hilar lymph nodes. The aorta remains tortuous. Diffuse osteopenia. Upper abdominal surgical clips on the right compatible with a previous cholecystectomy if.  IMPRESSION: 1. No acute abnormality. 2. Mild cardiomegaly. 3. Previous granulomatous infection.   Electronically Signed   By: Gordan Payment M.D.   On: 12/12/2012 19:25    EKG: Independently reviewed. nonspecific ST and T waves changes.  Assessment/Plan Principal Problem:   Lethargy Active Problems:   THROAT CANCER   DIABETES MELLITUS, CONTROLLED   HYPERTENSION   Weakness   S/P AVR   1. Lethargy  patient is presenting with lethargy that has been ongoing since last few weeks also he has weight loss with this and anorexia His anemia seems to be corrected after the blood transfusion. He does not have any significant medication changes , his urinalysis and chest x-ray does not show any signs of infection neither he has any leukocytosis.his platelet count is around his baseline .he does not have any focal neurological deficit on exam. He does have a rash on his upper body which is maculopapular in nature which is not aware of and thinks might have happened today. The probable etiology is worsening all thyroid function, other endocrine abnormality, infection especially infectious endocarditis due to his history of aVR, medication side effect. IV check blood cultures, echocardiogram, ESR CRP, TSH free T4 and cortisol level for further  workup. As per my discussion with the cardiology on call if he does not have any fever I would not give him any antibiotics. consulting PTOT in the morning   2. history of A. fib  Continue  Amiodarone.  3.hypothyroidism  Continue Synthroid   4. history of  chronic diastolic heart failure   continue Lasix  Consults:  cardiology  DVT Prophylaxis: subcutaneous Heparin Nutrition:  cardiac diet  Code Status: full  Family Communication:  family  was present at bedside, opportunity was given to ask question and all questions were answered satisfactorily at the time of interview. Disposition: Admitted to observation in telemetryunit.  Author: Lynden Oxford, MD [facility] Pager: [phone] 12/12/2012, 11:18 PM    If 7PM-7AM, please contact night-coverage [email or web address] Password [email or web address]

## 2012-12-12 NOTE — ED Provider Notes (Signed)
CSN: [id number]     Arrival date & time 12/12/12  1706 History   First MD Initiated Contact with Patient 12/12/12 1746     Chief Complaint  Patient presents with  . Weakness  . Nausea   (Consider location/radiation/quality/duration/timing/severity/associated sxs/prior Treatment) HPI  Chief complaint weakness and cough  Patient is 79 year old male past medical history significant for diabetes, CAD and pancytopenia who comes in today chief complaint of fatigue and cough. Patient reports he's had approximately 3 weeks' worth of fatigue. He's been evaluated multiple times by PCP. Patient was found to be anemic and received blood transfusion yesterday secondary to this. No benefit from blood transfusion. Patient also complaining of a cough. This is mild in severity. It is been present for approximately 2-3 weeks. Nonproductive. Patient was given antibiotic 2 weeks ago by PCP. Did not finish antibiotic. Patient does also endorses some fever and chills as well as generalized weakness. No other associated symptoms noted.  Past Medical History  Diagnosis Date  . Stroke   . Atrial fibrillation     amiodarone therapy; not a candidate for anticoagulation  . Bradycardia   . Aortic stenosis     echo 10/12: EF 65-70%, grade 2 diast dysfxn, severe AS, mean gradient 60 mmHg, mod LAE, PASP 47;  s/p minimally invasive tissue AVR with Dr. Silvestre Mesi at [facility] 01/2011 (pre-AVR cath with no obs CAD)  . Hypertension   . Diabetes mellitus   . Anemia   . Action tremor   . Thrombocytopenia     Dr. [name]  . Throat cancer     s/p resection  . Colon polyp   . Anxiety   . Splenomegaly   . Gout   . GERD (gastroesophageal reflux disease)   . Hypothyroidism   . Leukopenia     Chronic pancytopenia  . Degenerative disc disease   . Joint effusion, knee     left knee  . Synovial cyst of popliteal space   . Cellulitis of left leg 10/11-16/2012  . CKD (chronic kidney disease)   . CHF (congestive heart failure)    . Heart murmur    Past Surgical History  Procedure Laterality Date  . Cholecystectomy    . Joint effusion      left knee  . Aortic valve replacement  01/23/2011    via minimally invasive approach per Dr Silvestre Mesi, [facility]   Family History  Problem Relation Age of Onset  . Colon cancer    . Stroke    . Cancer Other     colon  . Stroke Other    History  Substance Use Topics  . Smoking status: Former Smoker    Quit date: 01/11/1962  . Smokeless tobacco: Not on file  . Alcohol Use: No    Review of Systems  Constitutional: Positive for fatigue.  Respiratory: Negative for shortness of breath.   Cardiovascular: Negative for chest pain.  Gastrointestinal: Negative for abdominal pain.  Genitourinary: Negative for dysuria.  Musculoskeletal: Negative for back pain.  Skin: Negative for rash.  Neurological: Positive for light-headedness. Negative for numbness and headaches.  Psychiatric/Behavioral: Negative for agitation.  All other systems reviewed and are negative.    Allergies  Penicillins and Neomycin-bacitracin zn-polymyx  Home Medications   Current Outpatient Rx  Name  Route  Sig  Dispense  Refill  . allopurinol (ZYLOPRIM) 100 MG tablet   Oral   Take 1 tablet (100 mg total) by mouth daily.   90 tablet   3   .  amiodarone (PACERONE) 200 MG tablet   Oral   Take 0.5 tablets (100 mg total) by mouth daily.   1 tablet   0   . amLODipine (NORVASC) 5 MG tablet   Oral   Take 1 tablet (5 mg total) by mouth daily.   90 tablet   3   . aspirin 325 MG tablet   Oral   Take 325 mg by mouth daily.           . Cholecalciferol (VITAMIN D) 1000 UNITS capsule   Oral   Take 1 capsule (1,000 Units total) by mouth daily.   90 capsule   3   . colchicine 0.6 MG tablet   Oral   Take 0.6 mg by mouth as needed. Take one tablet by mouth daily during an acute episode of gout.         . fish oil-omega-3 fatty acids 1000 MG capsule   Oral   Take 1 g by mouth daily.            . Flaxseed, Linseed, (FLAX SEED OIL) 1000 MG CAPS   Oral   Take 1 capsule by mouth daily.          . furosemide (LASIX) 40 MG tablet   Oral   Take 2 tablets (80 mg total) by mouth daily.   180 tablet   3   . hydrALAZINE (APRESOLINE) 50 MG tablet   Oral   Take 1 tablet (50 mg total) by mouth 3 (three) times daily.   270 tablet   3   . levothyroxine (LEVOTHROID) 25 MCG tablet   Oral   Take 1 tablet (25 mcg total) by mouth daily.   90 tablet   3   . LORazepam (ATIVAN) 0.5 MG tablet   Oral   Take 0.5 mg by mouth every 6 (six) hours as needed. For nerves/tremors         . metoprolol tartrate (LOPRESSOR) 25 MG tablet   Oral   Take 0.5 tablets (12.5 mg total) by mouth daily.   90 tablet   3   . Multiple Vitamin (MULTIVITAMIN) tablet   Oral   Take 1 tablet by mouth daily.           [name] Kitchen omeprazole (PRILOSEC) 20 MG capsule   Oral   Take 1 capsule (20 mg total) by mouth daily.   90 capsule   1   . potassium chloride (K-DUR) 10 MEQ tablet   Oral   Take 1 tablet (10 mEq total) by mouth daily.   90 tablet   3   . vitamin C (ASCORBIC ACID) 500 MG tablet   Oral   Take 1 tablet (500 mg total) by mouth daily.   90 tablet   3    BP 117/54  Pulse 72  Temp(Src) 97.4 F (36.3 C) (Oral)  Resp 15  Ht 5\' 10"  (1.778 m)  Wt 188 lb (85.276 kg)  BMI 26.98 kg/m2  SpO2 99% Physical Exam  Nursing note and vitals reviewed. Constitutional: He is oriented to person, place, and time. He appears well-developed.  Appears fatigued   HENT:  Head: Normocephalic and atraumatic.  Eyes: EOM are normal. Pupils are equal, round, and reactive to light.  Neck: Normal range of motion.  Cardiovascular: Normal rate, regular rhythm and intact distal pulses.   Murmur heard. Pulmonary/Chest: Effort normal and breath sounds normal. No respiratory distress. He exhibits no tenderness.  Slight non-productive cough noted, mainly CTAB  Abdominal: Soft.  He exhibits no distension. There is no  tenderness. There is no rebound and no guarding.  Musculoskeletal: Normal range of motion. He exhibits no edema and no tenderness.  Neurological: He is alert and oriented to person, place, and time. No cranial nerve deficit or sensory deficit. He exhibits normal muscle tone. Coordination normal. GCS eye subscore is 4. GCS verbal subscore is 5. GCS motor subscore is 6.  Skin: Skin is warm and dry. No rash noted.  Psychiatric: He has a normal mood and affect. His behavior is normal. Judgment and thought content normal.    ED Course  Procedures (including critical care time) Labs Review Labs Reviewed  CBC WITH DIFFERENTIAL - Abnormal; Notable for the following:    RBC 4.06 (*)    Hemoglobin 11.6 (*)    HCT 34.4 (*)    Platelets 50 (*)    Neutrophils Relative % 81 (*)    Lymphocytes Relative 11 (*)    Lymphs Abs 0.5 (*)    All other components within normal limits  COMPREHENSIVE METABOLIC PANEL - Abnormal; Notable for the following:    Glucose, Bld 120 (*)    BUN 39 (*)    Creatinine, Ser 1.97 (*)    Albumin 3.1 (*)    AST 42 (*)    GFR calc non Af Amer 29 (*)    GFR calc Af Amer 33 (*)    All other components within normal limits  RETICULOCYTES - Abnormal; Notable for the following:    RBC. 4.08 (*)    All other components within normal limits  LIPASE, BLOOD  URINALYSIS, ROUTINE W REFLEX MICROSCOPIC  LACTATE DEHYDROGENASE  HAPTOGLOBIN  POCT I-STAT TROPONIN I   Imaging Review Dg Chest 2 View  12/12/2012   CLINICAL DATA:  Weakness.  EXAM: CHEST  2 VIEW  COMPARISON:  12/08/2012.  FINDINGS: Mildly enlarged cardiac silhouette. Clear lungs. Stable tiny calcified granulomata on the left. Stable calcified hilar lymph nodes. The aorta remains tortuous. Diffuse osteopenia. Upper abdominal surgical clips on the right compatible with a previous cholecystectomy if.  IMPRESSION: 1. No acute abnormality. 2. Mild cardiomegaly. 3. Previous granulomatous infection.   Electronically Signed   By:  Gordan Payment M.D.   On: 12/12/2012 19:25    EKG Interpretation   None       MDM   1. Weakness     On arrival patient afebrile vital signs within normal limits. Patient does appear very fatigued and run down concerning for possible continued anemia, transfusion reactions, renal dysfunction or other infectious cause. Screening labs showed improvement in hemoglobin. Patient did receive 2 units packed red cells yesterday for symptomatic anemia. Appropriate rise in hemoglobin today. Reticulocyte count also obtained an inappropriate low given patient's anemia for 3 weeks discover by PCP. Patient still thrombocytopenic. Unchanged from previous. Patient does report fever yesterday. Concern for transfusion reaction. LDH and haptoglobin obtained to rule out hemolytic transfusion reaction. No evidence this time to support this. CMP within normal limits for this patient with known renal disease. No worsening renal function. EKG unchanged with normal troponin. Chest x-ray normal no signs of pneumonia. UA shows no UTI. On reexam patient continues to look very lethargic/fatigued. Given multiple medical comorbidities in 3 weeks for the symptoms without well identified cause undetermined patient will benefit from observation stay. Consult medicine hospitalist who agreed to admit the patient. Remains stable in the emergency department until time of transfer to floor.   Bridgett Larsson, MD 12/12/12 2150

## 2012-12-12 NOTE — Progress Notes (Signed)
Pre visit review using our clinic review tool, if applicable. No additional management support is needed unless otherwise documented below in the visit note. 

## 2012-12-13 ENCOUNTER — Observation Stay (HOSPITAL_COMMUNITY): Payer: Medicare Other

## 2012-12-13 DIAGNOSIS — D61818 Other pancytopenia: Secondary | ICD-10-CM

## 2012-12-13 DIAGNOSIS — Z952 Presence of prosthetic heart valve: Secondary | ICD-10-CM

## 2012-12-13 DIAGNOSIS — I359 Nonrheumatic aortic valve disorder, unspecified: Secondary | ICD-10-CM

## 2012-12-13 DIAGNOSIS — I2510 Atherosclerotic heart disease of native coronary artery without angina pectoris: Secondary | ICD-10-CM

## 2012-12-13 DIAGNOSIS — E43 Unspecified severe protein-calorie malnutrition: Secondary | ICD-10-CM | POA: Insufficient documentation

## 2012-12-13 DIAGNOSIS — R5381 Other malaise: Principal | ICD-10-CM

## 2012-12-13 DIAGNOSIS — I4891 Unspecified atrial fibrillation: Secondary | ICD-10-CM

## 2012-12-13 LAB — CBC WITH DIFFERENTIAL/PLATELET
Basophils Absolute: 0 10*3/uL (ref 0.0–0.1)
Basophils Relative: 0 % (ref 0–1)
Eosinophils Relative: 0 % (ref 0–5)
HCT: 26.6 % — ABNORMAL LOW (ref 39.0–52.0)
Lymphocytes Relative: 14 % (ref 12–46)
MCH: 28.2 pg (ref 26.0–34.0)
MCHC: 33.8 g/dL (ref 30.0–36.0)
MCV: 83.4 fL (ref 78.0–100.0)
Monocytes Absolute: 0.2 10*3/uL (ref 0.1–1.0)
RDW: 15.2 % (ref 11.5–15.5)

## 2012-12-13 LAB — COMPREHENSIVE METABOLIC PANEL
AST: 29 U/L (ref 0–37)
CO2: 19 mEq/L (ref 19–32)
Calcium: 8.2 mg/dL — ABNORMAL LOW (ref 8.4–10.5)
Chloride: 102 mEq/L (ref 96–112)
Creatinine, Ser: 1.91 mg/dL — ABNORMAL HIGH (ref 0.50–1.35)
GFR calc non Af Amer: 30 mL/min — ABNORMAL LOW (ref >90)
Sodium: 134 mEq/L — ABNORMAL LOW (ref 135–145)
Total Bilirubin: 0.6 mg/dL (ref 0.3–1.2)

## 2012-12-13 LAB — PROTIME-INR
INR: 1.33 (ref 0.00–1.49)
Prothrombin Time: 16.2 seconds — ABNORMAL HIGH (ref 11.6–15.2)

## 2012-12-13 LAB — TSH: TSH: 2.438 u[IU]/mL (ref 0.350–4.500)

## 2012-12-13 LAB — SEDIMENTATION RATE: Sed Rate: 1 mm/hr (ref 0–16)

## 2012-12-13 LAB — GLUCOSE, CAPILLARY: Glucose-Capillary: 118 mg/dL — ABNORMAL HIGH (ref 70–99)

## 2012-12-13 LAB — T4, FREE: Free T4: 1.3 ng/dL (ref 0.80–1.80)

## 2012-12-13 MED ORDER — VANCOMYCIN HCL 10 G IV SOLR
1250.0000 mg | INTRAVENOUS | Status: DC
  Administered 2012-12-13: 1250 mg via INTRAVENOUS
  Filled 2012-12-13: qty 1250

## 2012-12-13 MED ORDER — BOOST / RESOURCE BREEZE PO LIQD
1.0000 | Freq: Three times a day (TID) | ORAL | Status: DC
  Administered 2012-12-13 – 2012-12-17 (×7): 1 via ORAL

## 2012-12-13 MED ORDER — ZOLPIDEM TARTRATE 5 MG PO TABS
5.0000 mg | ORAL_TABLET | Freq: Once | ORAL | Status: AC
  Administered 2012-12-13: 23:00:00 5 mg via ORAL
  Filled 2012-12-13: qty 1

## 2012-12-13 MED ORDER — VANCOMYCIN HCL IN DEXTROSE 1-5 GM/200ML-% IV SOLN
1000.0000 mg | INTRAVENOUS | Status: DC
  Administered 2012-12-14: 1000 mg via INTRAVENOUS
  Filled 2012-12-13 (×2): qty 200

## 2012-12-13 MED ORDER — WHITE PETROLATUM GEL
Status: AC
  Administered 2012-12-13: 0.2
  Filled 2012-12-13: qty 5

## 2012-12-13 NOTE — Progress Notes (Signed)
CRITICAL VALUE ALERT  Critical value received: Blood Culture aerobic GRAM POSITIVE COCCI IN PAIRS   Date of notification:  12/13/12  Time of notification:  1915  Critical value read back: yes  Nurse who received alert:  Madelin Rear, MSN, RN, CMSRN  MD notified (1st page):  Claiborne Billings  Time of first page:  1958  MD notified (2nd page):  Time of second page:  Responding MD:  Claiborne Billings  Time MD responded:  2006

## 2012-12-13 NOTE — Progress Notes (Signed)
INITIAL NUTRITION ASSESSMENT  DOCUMENTATION CODES Per approved criteria  -Severe malnutrition in the context of acute illness or injury   INTERVENTION: 1.  Supplements; Resource Breeze po TID, each supplement provides 250 kcal and 9 grams of protein. 2.  General healthful diet; encourage intake of foods and beverages as able.  RD to follow and assess for nutritional adequacy.   NUTRITION DIAGNOSIS: Inadequate oral intake related to lethargy as evidenced by pt report.   Monitor:  1.  Food/Beverage; pt meeting >/=90% estimated needs with tolerance. 2.  Wt/wt change; monitor trends  Reason for Assessment: MST  79 y.o. male  Admitting Dx: Lethargy  ASSESSMENT: Pt admitted with fatigue and lethargy for the past 3-4 weeks. Pt reports poor appetite and wt loss.   Per chart review, pt has lost 10 lbs (5% of his usual wt) in the past 1 month.  Pt and wife state that pt has not been able to eat anything recently.  Pt initially experienced poor appetite with onset of illness 3-4 weeks ago and it has progressively worsened.  Pt's intake has been less and less until recently he hasn't been able to eat anything at all; "makes me sick to smell it or think about it."    Nutrition Focused Physical Exam: Subcutaneous Fat:  Orbital Region: mild wasting Upper Arm Region: mild wasting Thoracic and Lumbar Region: WNL  Muscle:  Temple Region: mild wasting Clavicle Bone Region: mild-moderate wasting Clavicle and Acromion Bone Region: mild wasting Scapular Bone Region: not assessed Dorsal Hand: mild-moderate wasting Patellar Region: mild wasting Anterior Thigh Region: mild wasting Posterior Calf Region: WNL  Edema: none present  Pt meets criteria for severe MALNUTRITION in the context of acute illness as evidenced by PO intake insufficient to meet >50% estimated needs, mild wasting noted on physical exam, 5% wt loss in 1 month.  Height: Ht Readings from Last 1 Encounters:  12/12/12 5' 10.5"  (1.791 m)    Weight: Wt Readings from Last 1 Encounters:  12/12/12 186 lb 1.1 oz (84.4 kg)    Ideal Body Weight: 160 lbs  % Ideal Body Weight: 116%  Wt Readings from Last 10 Encounters:  12/12/12 186 lb 1.1 oz (84.4 kg)  12/12/12 189 lb 12.8 oz (86.093 kg)  12/08/12 192 lb 4.8 oz (87.227 kg)  12/01/12 196 lb 12.8 oz (89.268 kg)  11/11/12 196 lb (88.905 kg)  09/02/12 195 lb (88.451 kg)  08/26/12 197 lb 4.8 oz (89.495 kg)  07/23/12 198 lb 1.9 oz (89.867 kg)  04/01/12 200 lb (90.719 kg)  03/19/12 196 lb (88.905 kg)    Usual Body Weight: 200 lbs  % Usual Body Weight: 93%  BMI:  Body mass index is 26.31 kg/(m^2).  Estimated Nutritional Needs: Kcal: 1850-2100 Protein: 85-100g Fluid: >2.0 L/day  Skin: intact, rash  Diet Order: Carb Control  EDUCATION NEEDS: -Education needs addressed  No intake or output data in the 24 hours ending 12/13/12 1145  Last BM: 12/5  Labs:   Recent Labs Lab 12/08/12 1320 12/12/12 1730 12/13/12 0520  NA 134* 136 134*  K 4.0 3.8 3.5  CL  --  102 102  CO2 18* 20 19  BUN 42.8* 39* 37*  CREATININE 2.1* 1.97* 1.91*  CALCIUM 8.8 8.8 8.2*  GLUCOSE 109 120* 106*    CBG (last 3)   Recent Labs  12/12/12 2354 12/13/12 0838  GLUCAP 108* 118*    Scheduled Meds: . allopurinol  100 mg Oral Daily  . amiodarone  100  mg Oral Daily  . amLODipine  5 mg Oral Daily  . aspirin  325 mg Oral Daily  . furosemide  80 mg Oral Daily  . hydrALAZINE  50 mg Oral TID  . levothyroxine  25 mcg Oral QAC breakfast  . metoprolol tartrate  12.5 mg Oral Daily  . pantoprazole  40 mg Oral Daily  . potassium chloride  10 mEq Oral Daily  . sodium chloride  3 mL Intravenous Q12H  . vancomycin  1,250 mg Intravenous Q24H    Continuous Infusions:   Past Medical History  Diagnosis Date  . Stroke   . Atrial fibrillation     amiodarone therapy; not a candidate for anticoagulation  . Bradycardia   . Aortic stenosis     echo 10/12: EF 65-70%, grade 2  diast dysfxn, severe AS, mean gradient 60 mmHg, mod LAE, PASP 47;  s/p minimally invasive tissue AVR with Dr. Silvestre Mesi at [facility] 01/2011 (pre-AVR cath with no obs CAD)  . Hypertension   . Diabetes mellitus   . Anemia   . Action tremor   . Thrombocytopenia     Dr. [name]  . Throat cancer     s/p resection  . Colon polyp   . Anxiety   . Splenomegaly   . Gout   . GERD (gastroesophageal reflux disease)   . Hypothyroidism   . Leukopenia     Chronic pancytopenia  . Degenerative disc disease   . Joint effusion, knee     left knee  . Synovial cyst of popliteal space   . Cellulitis of left leg 10/11-16/2012  . CKD (chronic kidney disease)   . CHF (congestive heart failure)   . Heart murmur     Past Surgical History  Procedure Laterality Date  . Cholecystectomy    . Joint effusion      left knee  . Aortic valve replacement  01/23/2011    via minimally invasive approach per Dr Silvestre Mesi, [facility]    Loyce Dys, MS RD LDN Clinical Inpatient Dietitian Pager: [phone] Weekend/After hours pager: [phone]

## 2012-12-13 NOTE — Progress Notes (Signed)
[name] [id number] Admitted to 5W25: 12/12/12 23:35 Attending Provider: Cathren Harsh, MD    [name] is a 79 y.o. male patient admitted from ED awake, alert  & orientated  X 3,  Full Code, VSS - Blood pressure 153/66, pulse 77, temperature 100 F (37.8 C), temperature source Oral, resp. rate 16, height 5' 10.5" (1.791 m), weight 84.4 kg (186 lb 1.1 oz), SpO2 99.00%.RA, no c/o shortness of breath, no c/o chest pain, no distress noted. Tele # [id number] placed and pt is currently running: NSR   IV site WDL:  with a transparent dsg that's clean dry and intact.  Allergies:   Allergies  Allergen Reactions  . Penicillins     Does not remember reaction (~year 1950)  . Neomycin-Bacitracin Zn-Polymyx     ? Reaction (thinks he remembers redness)     Past Medical History  Diagnosis Date  . Stroke   . Atrial fibrillation     amiodarone therapy; not a candidate for anticoagulation  . Bradycardia   . Aortic stenosis     echo 10/12: EF 65-70%, grade 2 diast dysfxn, severe AS, mean gradient 60 mmHg, mod LAE, PASP 47;  s/p minimally invasive tissue AVR with Dr. Silvestre Mesi at [facility] 01/2011 (pre-AVR cath with no obs CAD)  . Hypertension   . Diabetes mellitus   . Anemia   . Action tremor   . Thrombocytopenia     Dr. [name]  . Throat cancer     s/p resection  . Colon polyp   . Anxiety   . Splenomegaly   . Gout   . GERD (gastroesophageal reflux disease)   . Hypothyroidism   . Leukopenia     Chronic pancytopenia  . Degenerative disc disease   . Joint effusion, knee     left knee  . Synovial cyst of popliteal space   . Cellulitis of left leg 10/11-16/2012  . CKD (chronic kidney disease)   . CHF (congestive heart failure)   . Heart murmur     History:  obtained from patient  Pt orientation to unit, room and routine. Information packet given to patient/family and safety video needs to be viewed.  Admission INP armband ID verified with patient, and in place. SR up x 2, fall risk  assessment complete with Patient verbalizing understanding of risks associated with falls. Pt verbalizes an understanding of how to use the call bell and to call for help before getting out of bed.  Skin, clean-dry- intact without evidence of bruising, or skin tears; however BUE and BLE are discolored and dark.   Will cont to monitor and assist as needed.  Elisha Ponder, RN 12/13/2012 1:08 AM

## 2012-12-13 NOTE — ED Provider Notes (Signed)
I saw and evaluated the patient, reviewed the resident's note and I agree with the findings and plan.  Please see my separate note regarding my evaluation of the patient.    Vida Roller, MD 12/13/12 [phone]

## 2012-12-13 NOTE — Progress Notes (Addendum)
ANTIBIOTIC CONSULT NOTE - INITIAL  Pharmacy Consult for vancomycin Indication: suspected endocarditis  Allergies  Allergen Reactions  . Penicillins     Does not remember reaction (~year 1950)  . Neomycin-Bacitracin Zn-Polymyx     ? Reaction (thinks he remembers redness)    Patient Measurements: Height: 5' 10.5" (179.1 cm) Weight: 186 lb 1.1 oz (84.4 kg) IBW/kg (Calculated) : 74.15  Vital Signs: Temp: 100 F (37.8 C) (12/05 2335) Temp src: Oral (12/05 2335) BP: 153/66 mmHg (12/05 2335) Pulse Rate: 77 (12/05 2335)  Labs:  Recent Labs  12/12/12 1730  WBC 4.3  HGB 11.6*  PLT 50*  CREATININE 1.97*   Estimated Creatinine Clearance: 27.2 ml/min (by C-G formula based on Cr of 1.97).   Microbiology: No results found for this or any previous visit (from the past 720 hour(s)).  Medical History: Past Medical History  Diagnosis Date  . Stroke   . Atrial fibrillation     amiodarone therapy; not a candidate for anticoagulation  . Bradycardia   . Aortic stenosis     echo 10/12: EF 65-70%, grade 2 diast dysfxn, severe AS, mean gradient 60 mmHg, mod LAE, PASP 47;  s/p minimally invasive tissue AVR with Dr. Silvestre Mesi at [facility] 01/2011 (pre-AVR cath with no obs CAD)  . Hypertension   . Diabetes mellitus   . Anemia   . Action tremor   . Thrombocytopenia     Dr. [name]  . Throat cancer     s/p resection  . Colon polyp   . Anxiety   . Splenomegaly   . Gout   . GERD (gastroesophageal reflux disease)   . Hypothyroidism   . Leukopenia     Chronic pancytopenia  . Degenerative disc disease   . Joint effusion, knee     left knee  . Synovial cyst of popliteal space   . Cellulitis of left leg 10/11-16/2012  . CKD (chronic kidney disease)   . CHF (congestive heart failure)   . Heart murmur     Medications:  Prescriptions prior to admission  Medication Sig Dispense Refill  . allopurinol (ZYLOPRIM) 100 MG tablet Take 1 tablet (100 mg total) by mouth daily.  90 tablet  3  .  amiodarone (PACERONE) 200 MG tablet Take 0.5 tablets (100 mg total) by mouth daily.  1 tablet  0  . amLODipine (NORVASC) 5 MG tablet Take 1 tablet (5 mg total) by mouth daily.  90 tablet  3  . aspirin 325 MG tablet Take 325 mg by mouth daily.        . Cholecalciferol (VITAMIN D) 1000 UNITS capsule Take 1 capsule (1,000 Units total) by mouth daily.  90 capsule  3  . colchicine 0.6 MG tablet Take 0.6 mg by mouth as needed. Take one tablet by mouth daily during an acute episode of gout.      . fish oil-omega-3 fatty acids 1000 MG capsule Take 1 g by mouth daily.        . Flaxseed, Linseed, (FLAX SEED OIL) 1000 MG CAPS Take 1 capsule by mouth daily.       . furosemide (LASIX) 40 MG tablet Take 2 tablets (80 mg total) by mouth daily.  180 tablet  3  . hydrALAZINE (APRESOLINE) 50 MG tablet Take 1 tablet (50 mg total) by mouth 3 (three) times daily.  270 tablet  3  . levothyroxine (LEVOTHROID) 25 MCG tablet Take 1 tablet (25 mcg total) by mouth daily.  90 tablet  3  .  LORazepam (ATIVAN) 0.5 MG tablet Take 0.5 mg by mouth every 6 (six) hours as needed. For nerves/tremors      . metoprolol tartrate (LOPRESSOR) 25 MG tablet Take 0.5 tablets (12.5 mg total) by mouth daily.  90 tablet  3  . Multiple Vitamin (MULTIVITAMIN) tablet Take 1 tablet by mouth daily.        [name] Kitchen omeprazole (PRILOSEC) 20 MG capsule Take 1 capsule (20 mg total) by mouth daily.  90 capsule  1  . potassium chloride (K-DUR) 10 MEQ tablet Take 1 tablet (10 mEq total) by mouth daily.  90 tablet  3  . vitamin C (ASCORBIC ACID) 500 MG tablet Take 1 tablet (500 mg total) by mouth daily.  90 tablet  3   Scheduled:  . allopurinol  100 mg Oral Daily  . amiodarone  100 mg Oral Daily  . amLODipine  5 mg Oral Daily  . aspirin  325 mg Oral Daily  . furosemide  80 mg Oral Daily  . hydrALAZINE  50 mg Oral TID  . levothyroxine  25 mcg Oral QAC breakfast  . metoprolol tartrate  12.5 mg Oral Daily  . pantoprazole  40 mg Oral Daily  . potassium  chloride  10 mEq Oral Daily  . sodium chloride  3 mL Intravenous Q12H    Assessment: 79yo male c/o generalized weakness/fatigue w/ cough, fever, and chills, sent to ED by PCP to r/o PNA, rec'd two units yesterday for symptomatic anemia, concern for edocarditis, to begin IV ABX.  Goal of Therapy:  Vancomycin trough level 15-20 mcg/ml  Plan:  Will begin vancomycin 1250mg  IV Q24H and monitor CBC, Cx, levels prn.  Vernard Gambles, PharmD, BCPS  12/13/2012,4:29 AM   ------------------------------------------------------------------------------------------ Addendum  Given the patient's renal function, SCr 1.91, CrCl~25-30 ml/min. Will plan to reduce subsequent doses to 1g/24h for now. If truly concerned about endocarditis, consider adding on additional empiric antibiotics while ruling this out.   Plan 1. Reduce Vancomycin to 1g IV every 24 hours (starting on 12/7) 2. Will continue to follow renal function, culture results, LOT, and antibiotic de-escalation plans   Georgina Pillion, PharmD, BCPS Clinical Pharmacist Pager: [phone] 12/13/2012 1:53 PM

## 2012-12-13 NOTE — Progress Notes (Signed)
Utilization Review completed.  

## 2012-12-13 NOTE — Progress Notes (Signed)
[facility] PROGRESS NOTE  [name] AVW:[id number] DOB: Jun 08, 1922 DOA: 12/12/2012 PCP: Walter Melnick, MD  Assessment/Plan: 1. Generalized weakness. Could be secondary to pancytopenia. Other possibilities include underlying infectious process although lab work has thus far been unremarkable. He was treated with a course of antibiotic therapy as an outpatient. I discussed case with his oncologist Dr. Truett Walton. Lab work showed a sedimentation rate of 1, LDH of 193, chest x-ray clear, without clinical evidence of acute decompensated congestive heart failure. Heart rates have been stable the 60s to 70s range, blood pressure is stable as well. Unclear etiology for his generalized weakness and fatigue. Dr [name] stated he would come by on Monday to see him. Despite recent blood transfusion at the [facility] he has not felt better. Blood cultures pending. Patient was started on empiric IV antibiotic therapy with vancomycin. Will continue Vanc for the next 24 hours, reassess in a.m. 2. Chronic pancytopenia. Patient undergoing multiple bone marrow biopsies which have been unrevealing. Bone marrow biopsy performed on 11/20/2010 showing slightly hypercellular marrow for age, erythroid predominant trilineage hematopoiesis, a spectrum of maturation in all 3 hematopoietic leakages, negative for dysplasia negative for lymphoma. Dr [name] consulted.  3. History of aortic valve replacement, procedure performed at [facility] on 01/23/2011. He presently does not show symptoms of acute decompensated heart failure. Blood cultures are pending at the time of this dictation. 4. Hypertension. Continue amlodipine 5 mg by mouth daily and hydralazine 50 mg by mouth 3 times a day. 5. Hypothyroidism. Continue Synthroid 25 mcg by mouth daily. Will check a TSH  Code Status: Full Code Family Communication: [name] updated Disposition Plan: Follow up on cultures, continue Vanc for the next  24 hours, reassess in am.    Consultants:  MedOnc  Antibiotics:  Vanc  HPI/Subjective: Patient is an 79 year old woman with a past medical history of age of fibrillation, aortic valve replacement, presented with a four-week history of generalized weakness, malaise, fatigue and poor tolerance to physical exertion. He has a history of pancytopenia undergoing bone marrow biopsies in the past which have been unrevealing. On my evaluation today he reports feeling slightly better although continues to have fatigue and weakness.  Objective: Filed Vitals:   12/12/12 2335  BP: 153/66  Pulse: 77  Temp: 100 F (37.8 C)  Resp: 16   No intake or output data in the 24 hours ending 12/13/12 1339 Filed Weights   12/12/12 1734 12/12/12 2335  Weight: 85.276 kg (188 lb) 84.4 kg (186 lb 1.1 oz)    Exam:   General:  Chronically ill appearing, no acute distress, reports ongoing fatigue.   Cardiovascular: 3/6 systolic ejection murmur, no rubs or gallops  Respiratory: Normal respiratory effort, no wheezing rhonchi rales  Abdomen: Soft nontender nontender positive bowel sound  Musculoskeletal: Patient having 1+ bilateral pedal edema, SCDs in place  Data Reviewed: Basic Metabolic Panel:  Recent Labs Lab 12/08/12 1320 12/12/12 1730 12/13/12 0520  NA 134* 136 134*  K 4.0 3.8 3.5  CL  --  102 102  CO2 18* 20 19  GLUCOSE 109 120* 106*  BUN 42.8* 39* 37*  CREATININE 2.1* 1.97* 1.91*  CALCIUM 8.8 8.8 8.2*   Liver Function Tests:  Recent Labs Lab 12/08/12 1320 12/12/12 1730 12/13/12 0520  AST 31 42* 29  ALT 25 31 22   ALKPHOS 68 98 80  BILITOT 0.65 0.7 0.6  PROT 6.1* 7.2 5.7*  ALBUMIN 2.8* 3.1* 2.6*    Recent Labs Lab  12/12/12 1730  LIPASE 27   No results found for this basename: AMMONIA,  in the last 168 hours CBC:  Recent Labs Lab 12/08/12 1320 12/12/12 1730 12/13/12 0520  WBC 2.8* 4.3 1.9*  NEUTROABS 2.3 3.5 1.4*  HGB 8.3* 11.6* 9.0*  HCT 25.5* 34.4* 26.6*   MCV 84.7 84.7 83.4  PLT 49* 50* 40*   Cardiac Enzymes: No results found for this basename: CKTOTAL, CKMB, CKMBINDEX, TROPONINI,  in the last 168 hours BNP (last 3 results)  Recent Labs  02/14/12 1729  PROBNP 404.0*   CBG:  Recent Labs Lab 12/12/12 2354 12/13/12 0838  GLUCAP 108* 118*    No results found for this or any previous visit (from the past 240 hour(s)).   Studies: Dg Chest 2 View  12/12/2012   CLINICAL DATA:  Weakness.  EXAM: CHEST  2 VIEW  COMPARISON:  12/08/2012.  FINDINGS: Mildly enlarged cardiac silhouette. Clear lungs. Stable tiny calcified granulomata on the left. Stable calcified hilar lymph nodes. The aorta remains tortuous. Diffuse osteopenia. Upper abdominal surgical clips on the right compatible with a previous cholecystectomy if.  IMPRESSION: 1. No acute abnormality. 2. Mild cardiomegaly. 3. Previous granulomatous infection.   Electronically Signed   By: Gordan Payment M.D.   On: 12/12/2012 19:25   US Abdomen Complete  12/13/2012   CLINICAL DATA:  Left upper quadrant pain, history of splenomegaly  EXAM: ULTRASOUND ABDOMEN COMPLETE  COMPARISON:  CT abdomen/ pelvis 06/14/2007  FINDINGS: Gallbladder:  The gallbladder is surgically absent.  Common bile duct:  Diameter: Within normal limits at 4.6 mm  Liver:  No focal lesion identified. Within normal limits in parenchymal echogenicity. Trace perihepatic ascites.  IVC:  No abnormality visualized.  Pancreas:  Visualized portion unremarkable.  Spleen:  Marked splenomegaly. The splenic volume is approximated at 3781 cc and the spleen measures approximately 25 cm in craniocaudal dimension.  Right Kidney:  Length: 11.8 cm. Diffuse cortical thinning. The remaining renal parenchyma appears echogenic. There are multiple sonographically simple cysts, the largest measuring up to 5.4 cm exophytic from the lower pole.  Left Kidney:  Length: 13.9 cm. Diffuse cortical thinning. The remaining renal parenchyma appears echogenic.  Sonographically simple renal cysts. The largest measures 6.5 cm exophytic from the lower pole.  Abdominal aorta:  No aneurysm visualized.  Partially obscured by overlying bowel gas.  Other findings:  None.  IMPRESSION: 1. The marked splenomegaly. The splenic volume is estimated at 3781 cc. Comparing across modalities to the prior CT of the abdomen and pelvis from June of 2009, the splenic size is similar to mildly enlarged. 2. Trace perihepatic ascites. 3. Bilateral renal cortical thinning with echogenic parenchyma suggesting underlying medical renal disease. 4. Bilateral large but sonographically simple renal cysts. On the left, an exophytic cyst from the lower pole measures up to 6.5 cm.   Electronically Signed   By: Malachy Moan M.D.   On: 12/13/2012 08:20    Scheduled Meds: . allopurinol  100 mg Oral Daily  . amiodarone  100 mg Oral Daily  . amLODipine  5 mg Oral Daily  . aspirin  325 mg Oral Daily  . feeding supplement (RESOURCE BREEZE)  1 Container Oral TID BM  . furosemide  80 mg Oral Daily  . hydrALAZINE  50 mg Oral TID  . levothyroxine  25 mcg Oral QAC breakfast  . metoprolol tartrate  12.5 mg Oral Daily  . pantoprazole  40 mg Oral Daily  . potassium chloride  10 mEq Oral Daily  .  sodium chloride  3 mL Intravenous Q12H  . vancomycin  1,250 mg Intravenous Q24H   Continuous Infusions:   Principal Problem:   Lethargy Active Problems:   THROAT CANCER   DIABETES MELLITUS, CONTROLLED   HYPERTENSION   Weakness   S/P AVR   Protein-calorie malnutrition, severe    Time spent: 35 minutes    Jeralyn Bennett  [facility] Pager [phone]. If 7PM-7AM, please contact night-coverage at [email or web address], password [facility] 12/13/2012, 1:39 PM  LOS: 1 day

## 2012-12-14 DIAGNOSIS — D649 Anemia, unspecified: Secondary | ICD-10-CM

## 2012-12-14 DIAGNOSIS — D696 Thrombocytopenia, unspecified: Secondary | ICD-10-CM

## 2012-12-14 DIAGNOSIS — D72819 Decreased white blood cell count, unspecified: Secondary | ICD-10-CM

## 2012-12-14 DIAGNOSIS — N189 Chronic kidney disease, unspecified: Secondary | ICD-10-CM

## 2012-12-14 DIAGNOSIS — I330 Acute and subacute infective endocarditis: Secondary | ICD-10-CM

## 2012-12-14 DIAGNOSIS — I517 Cardiomegaly: Secondary | ICD-10-CM

## 2012-12-14 LAB — CBC
MCH: 28.3 pg (ref 26.0–34.0)
MCHC: 33.3 g/dL (ref 30.0–36.0)
MCV: 84.9 fL (ref 78.0–100.0)
Platelets: 55 10*3/uL — ABNORMAL LOW (ref 150–400)
RBC: 3.25 MIL/uL — ABNORMAL LOW (ref 4.22–5.81)
RDW: 15.2 % (ref 11.5–15.5)

## 2012-12-14 LAB — BASIC METABOLIC PANEL
Calcium: 8 mg/dL — ABNORMAL LOW (ref 8.4–10.5)
Creatinine, Ser: 1.86 mg/dL — ABNORMAL HIGH (ref 0.50–1.35)
GFR calc Af Amer: 36 mL/min — ABNORMAL LOW (ref >90)
GFR calc non Af Amer: 31 mL/min — ABNORMAL LOW (ref >90)
Sodium: 130 mEq/L — ABNORMAL LOW (ref 135–145)

## 2012-12-14 MED ORDER — GENTAMICIN IN SALINE 1.6-0.9 MG/ML-% IV SOLN
80.0000 mg | INTRAVENOUS | Status: DC
  Administered 2012-12-14: 80 mg via INTRAVENOUS
  Filled 2012-12-14: qty 50

## 2012-12-14 MED ORDER — DEXTROSE 5 % IV SOLN
2.0000 g | INTRAVENOUS | Status: DC
  Administered 2012-12-14 – 2012-12-17 (×4): 2 g via INTRAVENOUS
  Filled 2012-12-14 (×4): qty 2

## 2012-12-14 MED ORDER — POTASSIUM CHLORIDE CRYS ER 20 MEQ PO TBCR
40.0000 meq | EXTENDED_RELEASE_TABLET | Freq: Once | ORAL | Status: AC
  Administered 2012-12-14: 40 meq via ORAL
  Filled 2012-12-14: qty 2

## 2012-12-14 MED ORDER — TEMAZEPAM 15 MG PO CAPS: 15.0000 mg | ORAL_CAPSULE | Freq: Every evening | ORAL | Status: DC | PRN

## 2012-12-14 MED ORDER — ZOLPIDEM TARTRATE 5 MG PO TABS
5.0000 mg | ORAL_TABLET | Freq: Every evening | ORAL | Status: DC | PRN
  Administered 2012-12-14 – 2012-12-16 (×3): 5 mg via ORAL
  Filled 2012-12-14 (×3): qty 1

## 2012-12-14 NOTE — Progress Notes (Signed)
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 Penicillins Other (See Comments)    Does not remember reaction (~year 1950)  . Neomycin-Bacitracin Zn-Polymyx     ? Reaction (thinks he remembers redness)    Patient Measurements: Height: 5' 10.5" (179.1 cm) Weight: 186 lb 1.1 oz (84.4 kg) IBW/kg (Calculated) : 74.15  Vital Signs: Temp: 98 F (36.7 C) (12/07 0658) Temp src: Oral (12/07 0658) BP: 108/52 mmHg (12/07 1022) Pulse Rate: 73 (12/07 1022) Intake/Output from previous day:   Intake/Output from this shift:    Labs:  Recent Labs  12/12/12 1730 12/13/12 0520 12/14/12 1300  WBC 4.3 1.9* 2.5*  HGB 11.6* 9.0* 9.2*  PLT 50* 40* 55*  CREATININE 1.97* 1.91* 1.86*   Estimated Creatinine Clearance: 28.8 ml/min (by C-G formula based on Cr of 1.86). No results found for this basename: VANCOTROUGH, Leodis Binet, VANCORANDOM, GENTTROUGH, GENTPEAK, GENTRANDOM, TOBRATROUGH, TOBRAPEAK, TOBRARND, AMIKACINPEAK, AMIKACINTROU, AMIKACIN,  in the last 72 hours   Microbiology: Recent Results (from the past 720 hour(s))  CULTURE, BLOOD (ROUTINE X 2)     Status: None   Collection Time    12/13/12 12:35 AM      Result Value Range Status   Specimen Description BLOOD RIGHT WRIST   Final   Special Requests BOTTLES DRAWN AEROBIC ONLY 10CC   Final   Culture  Setup Time     Final   Value: 12/13/2012 05:48     Performed at [facility]   Culture     Final   Value: STREPTOCOCCUS SPECIES     Note: Gram Stain Report Called to,Read Back By and Verified With: Foye Spurling RN 12/13/2012 1915PM     Performed at [facility]   Report Status PENDING   Incomplete  CULTURE, BLOOD (ROUTINE X 2)     Status: None   Collection Time    12/13/12  2:45 AM      Result Value Range Status   Specimen Description BLOOD RIGHT ARM   Final   Special Requests BOTTLES DRAWN  AEROBIC AND ANAEROBIC 10CC EACH   Final   Culture  Setup Time     Final   Value: 12/13/2012 14:28     Performed at [facility]   Culture     Final   Value: STREPTOCOCCUS SPECIES     Note: Gram Stain Report Called to,Read Back By and Verified With: [name] RN on 12/14/12 at 03:15 by Christie Nottingham     Performed at [facility]   Report Status PENDING   Incomplete    Anti-infectives   Start     Dose/Rate Route Frequency Ordered Stop   12/14/12 1400  gentamicin (GARAMYCIN) IVPB 80 mg     80 mg 100 mL/hr over 30 Minutes Intravenous Every 24 hours 12/14/12 1259     12/14/12 1300  cefTRIAXone (ROCEPHIN) 2 g in dextrose 5 % 50 mL IVPB     2 g 100 mL/hr over 30 Minutes Intravenous Every 24 hours 12/14/12 1235     12/14/12 0500  vancomycin (VANCOCIN) IVPB 1000 mg/200 mL premix     1,000 mg 200 mL/hr over 60 Minutes Intravenous Every 24 hours 12/13/12 1355     12/13/12 0500  vancomycin (VANCOCIN) 1,250 mg in sodium chloride 0.9 % 250 mL IVPB  Status:  Discontinued     1,250 mg 166.7 mL/hr over 90 Minutes Intravenous  Every 24 hours 12/13/12 0433 12/13/12 1355      Assessment: 79 y.o. M admitted on 12/5 with fatigue and lethargy and started on Vancomycin for r/o IE. Blood cultures are now growing 2/2 Streptococcus species. ECHO (TTE) done today -- results not yet reported. ID has been consulted and asked pharmacy to dose Gentamicin for synergy. The patient continues on Vancomycin + Ceftriaxone while awaiting sensitivities of the cultures. Given the patient's reduced renal function, Natasha Bence will be dosed once daily. SCr 1.86, CrCl~25-30 ml/min. Vancomycin dose remains appropriate at this time.   Goal of Therapy:  Vancomycin trough level 15-20 mcg/ml Gentamicin peak of 3-4 mcg/ml Gent trough of <1 mcg/ml  Plan:  1. Gentamicin 80 mg IV every 24 hours 2. Continue Vancomycin 1g IV every 24 hours 3. Will f/u sensitivities, renal function, and planned duration of  antibiotics  Georgina Pillion, PharmD, BCPS Clinical Pharmacist Pager: [phone] 12/14/2012 2:06 PM

## 2012-12-14 NOTE — Progress Notes (Signed)
  Echocardiogram 2D Echocardiogram has been performed.  [name] 12/14/2012, 12:26 PM

## 2012-12-14 NOTE — Progress Notes (Addendum)
[facility] PROGRESS NOTE  [name] WUJ:[id number] DOB: 06/27/1922 DOA: 12/12/2012 PCP: [name], MD  Assessment/Plan: 1. Endocarditis. Patient with a history of aortic valve replacement with bioprosthesis, performed in 2013 at [facility], admitted for generalized weakness and fatigue. Blood cultures that were drawn on 12/13/2012 have now come back positive for Streptococcus species on both sets. I have discussed case with both cardiology and infectious disease. Patient will be made n.p.o. after midnight, schedule transesophageal echocardiogram in a.m. Dr. [name] recommending continuing IV vancomycin and starting gentamicin. I do not see an obvious source of infection on physical examination, although did have a dried scab to his left shin. There is no purulence or erythema noted. He does not have a chronic indwelling line. He reports developing a rash to penicillin a long time ago. 2. Chronic pancytopenia. Patient undergoing multiple bone marrow biopsies which have been unrevealing. Bone marrow biopsy performed on 11/20/2010 showing slightly hypercellular marrow for age, erythroid predominant trilineage hematopoiesis, a spectrum of maturation in all 3 hematopoietic leakages, negative for dysplasia negative for lymphoma. Dr [name] consulted.  3. Stage III chronic kidney disease. Patient's baseline creatinine near 2.0. A.m. lab work performed on 12/13/2012 showing a creatinine of 1.9. 4. History of aortic valve replacement, procedure performed at [facility] on 01/23/2011. Blood cultures have now grown Streptococcus species x2 sets. Both cardiology and infectious disease consulted. On IV vancomycin and gentamicin. 5. Hypertension. Continue amlodipine 5 mg by mouth daily and hydralazine 50 mg by mouth 3 times a day. 6. Hypothyroidism. Continue Synthroid 25 mcg by mouth daily. Will check a TSH  Code Status: Full Code Family Communication: Patient's  wife updated Disposition Plan: Follow up on cultures, continue Vanc for the next 24 hours, reassess in am.    Consultants:  Med[facility]c  Cardiology  ID  Antibiotics:  Vanc  HPI/Subjective: Patient is an 79 year old woman with a past medical history of age of fibrillation, aortic valve replacement, presented with a four-week history of generalized weakness, malaise, fatigue and poor tolerance to physical exertion. He has a history of pancytopenia undergoing bone marrow biopsies in the past which have been unrevealing.  On 12/14/2012 he reports feeling about the same with ongoing generalized weakness and fatigue, having poor oral intake. Blood cultures were followed, has Streptococcus species growing from both bottles.  Objective: Filed Vitals:   12/14/12 1022  BP: 108/52  Pulse: 73  Temp:   Resp:    No intake or output data in the 24 hours ending 12/14/12 1155 Filed Weights   12/12/12 1734 12/12/12 2335  Weight: 85.276 kg (188 lb) 84.4 kg (186 lb 1.1 oz)    Exam:   General:  Chronically ill appearing, no acute distress, reports ongoing fatigue.   Cardiovascular: 3/6 systolic ejection murmur, no rubs or gallops  Respiratory: Normal respiratory effort, no wheezing rhonchi rales  Abdomen: Soft nontender nontender positive bowel sound  Musculoskeletal: Patient having 1+ bilateral pedal edema, SCDs in place  Data Reviewed: Basic Metabolic Panel:  Recent Labs Lab 12/08/12 1320 12/12/12 1730 12/13/12 0520  NA 134* 136 134*  K 4.0 3.8 3.5  CL  --  102 102  CO2 18* 20 19  GLUCOSE 109 120* 106*  BUN 42.8* 39* 37*  CREATININE 2.1* 1.97* 1.91*  CALCIUM 8.8 8.8 8.2*   Liver Function Tests:  Recent Labs Lab 12/08/12 1320 12/12/12 1730 12/13/12 0520  AST 31 42* 29  ALT 25 31 22   ALKPHOS 68 98  80  BILITOT 0.65 0.7 0.6  PROT 6.1* 7.2 5.7*  ALBUMIN 2.8* 3.1* 2.6*    Recent Labs Lab 12/12/12 1730  LIPASE 27   No results found for this basename: AMMONIA,  in  the last 168 hours CBC:  Recent Labs Lab 12/08/12 1320 12/12/12 1730 12/13/12 0520  WBC 2.8* 4.3 1.9*  NEUTROABS 2.3 3.5 1.4*  HGB 8.3* 11.6* 9.0*  HCT 25.5* 34.4* 26.6*  MCV 84.7 84.7 83.4  PLT 49* 50* 40*   Cardiac Enzymes: No results found for this basename: CKTOTAL, CKMB, CKMBINDEX, TROPONINI,  in the last 168 hours BNP (last 3 results)  Recent Labs  02/14/12 1729 12/13/12 0520  PROBNP 404.0* 4856.0*   CBG:  Recent Labs Lab 12/12/12 2354 12/13/12 0838 12/14/12 0751  GLUCAP 108* 118* 110*    Recent Results (from the past 240 hour(s))  CULTURE, BLOOD (ROUTINE X 2)     Status: None   Collection Time    12/13/12 12:35 AM      Result Value Range Status   Specimen Description BLOOD RIGHT WRIST   Final   Special Requests BOTTLES DRAWN AEROBIC ONLY 10CC   Final   Culture  Setup Time     Final   Value: 12/13/2012 05:48     Performed at [facility]   Culture     Final   Value: STREPTOCOCCUS SPECIES     Note: Gram Stain Report Called to,Read Back By and Verified With: Foye Spurling RN 12/13/2012 1915PM     Performed at [facility]   Report Status PENDING   Incomplete  CULTURE, BLOOD (ROUTINE X 2)     Status: None   Collection Time    12/13/12  2:45 AM      Result Value Range Status   Specimen Description BLOOD RIGHT ARM   Final   Special Requests BOTTLES DRAWN AEROBIC AND ANAEROBIC 10CC EACH   Final   Culture  Setup Time     Final   Value: 12/13/2012 14:28     Performed at [facility]   Culture     Final   Value: STREPTOCOCCUS SPECIES     Note: Gram Stain Report Called to,Read Back By and Verified With: [name] RN on 12/14/12 at 03:15 by Christie Nottingham     Performed at [facility]   Report Status PENDING   Incomplete     Studies: Dg Chest 2 View  12/12/2012   CLINICAL DATA:  Weakness.  EXAM: CHEST  2 VIEW  COMPARISON:  12/08/2012.  FINDINGS: Mildly enlarged cardiac silhouette. Clear lungs. Stable tiny calcified  granulomata on the left. Stable calcified hilar lymph nodes. The aorta remains tortuous. Diffuse osteopenia. Upper abdominal surgical clips on the right compatible with a previous cholecystectomy if.  IMPRESSION: 1. No acute abnormality. 2. Mild cardiomegaly. 3. Previous granulomatous infection.   Electronically Signed   By: Gordan Payment M.D.   On: 12/12/2012 19:25   US Abdomen Complete  12/13/2012   CLINICAL DATA:  Left upper quadrant pain, history of splenomegaly  EXAM: ULTRASOUND ABDOMEN COMPLETE  COMPARISON:  CT abdomen/ pelvis 06/14/2007  FINDINGS: Gallbladder:  The gallbladder is surgically absent.  Common bile duct:  Diameter: Within normal limits at 4.6 mm  Liver:  No focal lesion identified. Within normal limits in parenchymal echogenicity. Trace perihepatic ascites.  IVC:  No abnormality visualized.  Pancreas:  Visualized portion unremarkable.  Spleen:  Marked splenomegaly. The splenic volume is approximated at 3781  cc and the spleen measures approximately 25 cm in craniocaudal dimension.  Right Kidney:  Length: 11.8 cm. Diffuse cortical thinning. The remaining renal parenchyma appears echogenic. There are multiple sonographically simple cysts, the largest measuring up to 5.4 cm exophytic from the lower pole.  Left Kidney:  Length: 13.9 cm. Diffuse cortical thinning. The remaining renal parenchyma appears echogenic. Sonographically simple renal cysts. The largest measures 6.5 cm exophytic from the lower pole.  Abdominal aorta:  No aneurysm visualized.  Partially obscured by overlying bowel gas.  Other findings:  None.  IMPRESSION: 1. The marked splenomegaly. The splenic volume is estimated at 3781 cc. Comparing across modalities to the prior CT of the abdomen and pelvis from June of 2009, the splenic size is similar to mildly enlarged. 2. Trace perihepatic ascites. 3. Bilateral renal cortical thinning with echogenic parenchyma suggesting underlying medical renal disease. 4. Bilateral large but  sonographically simple renal cysts. On the left, an exophytic cyst from the lower pole measures up to 6.5 cm.   Electronically Signed   By: Malachy Moan M.D.   On: 12/13/2012 08:20    Scheduled Meds: . allopurinol  100 mg Oral Daily  . amiodarone  100 mg Oral Daily  . amLODipine  5 mg Oral Daily  . aspirin  325 mg Oral Daily  . feeding supplement (RESOURCE BREEZE)  1 Container Oral TID BM  . furosemide  80 mg Oral Daily  . hydrALAZINE  50 mg Oral TID  . levothyroxine  25 mcg Oral QAC breakfast  . metoprolol tartrate  12.5 mg Oral Daily  . pantoprazole  40 mg Oral Daily  . potassium chloride  10 mEq Oral Daily  . sodium chloride  3 mL Intravenous Q12H  . vancomycin  1,000 mg Intravenous Q24H   Continuous Infusions:   Principal Problem:   Lethargy Active Problems:   THROAT CANCER   DIABETES MELLITUS, CONTROLLED   HYPERTENSION   Weakness   S/P AVR   Protein-calorie malnutrition, severe    Time spent: 35 minutes    Jeralyn Bennett  [facility] Pager [phone]. If 7PM-7AM, please contact night-coverage at [email or web address], password [email or web address] 12/14/2012, 11:55 AM  LOS: 2 days

## 2012-12-14 NOTE — Plan of Care (Signed)
Problem: Phase III Progression Outcomes Goal: Other Phase III Outcomes/Goals Outcome: Completed/Met Date Met:  12/14/12 Pt and family given "Living Better with Heart Failure" Pt Info Packet

## 2012-12-14 NOTE — Consult Note (Addendum)
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 Vanessa Barbara   HPI: [name] is an 79 y.o. male with past medical history significant for chronic thrombocytopenia, aortic stenosis atrial fibrillation bradycardia status post aortic valve replacement at [facility] in January 2013 who has felt malaise and fatigue for approximately 3 weeks. No clear-cut etiology was identified but he was given a course of doxycycline in late November. He did have symptoms of "hot flashes and malaise and fatigue that worsened with worsenign anorexia and lethargy. He was mid of the hospitalist service yesterday on the fifth and blood cultures were taken which now are growing a streptococcal species. Been started on vancomycin I'm adding ceftriaxone and gentamicin. He has had no recent dental work he did have a contusion on his left leg where he struck against an item near a barn. He has had no recent  episodes of cellulitis. He is reportedly allergic to penicillin although he states that the rash that he have a penicillin out of more than 50 years ago from [place] it appears that he received several doses of ceftaz edema as well several doses of Ancef while an inpatient in 2009 without complication.   Past Medical History  Diagnosis Date  . Stroke   . Atrial fibrillation     amiodarone therapy; not a candidate for anticoagulation  . Bradycardia   . Aortic stenosis     echo 10/12: EF 65-70%, grade 2 diast dysfxn, severe AS, mean gradient 60 mmHg, mod LAE, PASP 47;  s/p minimally invasive tissue AVR with Dr. Silvestre Mesi at [facility] 01/2011 (pre-AVR cath with no obs CAD)  . Hypertension   . Diabetes mellitus   . Anemia   . Action tremor   . Thrombocytopenia     Dr. [name]  . Throat cancer     s/p resection  . Colon  polyp   . Anxiety   . Splenomegaly   . Gout   . GERD (gastroesophageal reflux disease)   . Hypothyroidism   . Leukopenia     Chronic pancytopenia  . Degenerative disc disease   . Joint effusion, knee     left knee  . Synovial cyst of popliteal space   . Cellulitis of left leg 10/11-16/2012  . CKD (chronic kidney disease)   . CHF (congestive heart failure)   . Heart murmur     Past Surgical History  Procedure Laterality Date  . Cholecystectomy    . Joint effusion      left knee  . Aortic valve replacement  01/23/2011    via minimally invasive approach per Dr Silvestre Mesi, [facility]  ergies:   Allergies  Allergen Reactions  . Penicillins Other (See Comments)    Does not remember reaction (~year 1948)  . Neomycin-Bacitracin Zn-Polymyx     ? Reaction (thinks he remembers redness)     Medications: I have reviewed patients current medications as documented in Epic Anti-infectives   Start     Dose/Rate Route Frequency Ordered Stop   12/14/12 1300  cefTRIAXone (ROCEPHIN) 2 g in dextrose 5 % 50 mL IVPB     2 g 100 mL/hr over 30 Minutes Intravenous Every 24 hours 12/14/12 1235     12/14/12 0500  vancomycin (VANCOCIN) IVPB 1000 mg/200 mL premix     1,000 mg 200  mL/hr over 60 Minutes Intravenous Every 24 hours 12/13/12 1355     12/13/12 0500  vancomycin (VANCOCIN) 1,250 mg in sodium chloride 0.9 % 250 mL IVPB  Status:  Discontinued     1,250 mg 166.7 mL/hr over 90 Minutes Intravenous Every 24 hours 12/13/12 0433 12/13/12 1355      Social History:  reports that he quit smoking about 50 years ago. He does not have any smokeless tobacco history on file. He reports that he does not drink alcohol or use illicit drugs.  Family History  Problem Relation Age of Onset  . Colon cancer    . Stroke    . Cancer Other     colon  . Stroke Other     As in HPI and primary teams notes otherwise 12 point review of systems is negative  Blood pressure 108/52, pulse 73, temperature 98 F (36.7 C),  temperature source Oral, resp. rate 18, height 5' 10.5" (1.791 m), weight 186 lb 1.1 oz (84.4 kg), SpO2 100.00%. General: Alert and awake, oriented x3, not in any acute distress. HEENT: anicteric sclera, pupils reactive to light and accommodation, EOMI, oropharynx clear and without exudate CVS regular rate , II/VI holosystolic murmur at the RUSB , also at PMI but less intense there Chest: clear to auscultation bilaterally, no wheezing, rales or rhonchi Abdomen: soft nontender, nondistended, normal bowel sounds, Extremities: 2+ edema legs Skin: multiple ecchymoses on his arms: No obvious Janeway lesions or Osler's nodes on his hands  He does have patch of erythema on ankle right side see below  s have a few areas in the nailbeds ?  splinter hemorrhages see pictures attached.           Neuro: nonfocal, strength and sensation intact   Results for orders placed during the hospital encounter of 12/12/12 (from the past 48 hour(s))  CBC WITH DIFFERENTIAL     Status: Abnormal   Collection Time    12/12/12  5:30 PM      Result Value Range   WBC 4.3  4.0 - 10.5 K/uL   RBC 4.06 (*) 4.22 - 5.81 MIL/uL   Hemoglobin 11.6 (*) 13.0 - 17.0 g/dL   HCT 81.1 (*) 91.4 - 78.2 %   MCV 84.7  78.0 - 100.0 fL   MCH 28.6  26.0 - 34.0 pg   MCHC 33.7  30.0 - 36.0 g/dL   RDW 95.6  21.3 - 08.6 %   Platelets 50 (*) 150 - 400 K/uL   Comment: REPEATED TO VERIFY     SPECIMEN CHECKED FOR CLOTS     PLATELETS APPEAR DECREASED     PLATELET COUNT CONFIRMED BY SMEAR   Neutrophils Relative % 81 (*) 43 - 77 %   Neutro Abs 3.5  1.7 - 7.7 K/uL   Lymphocytes Relative 11 (*) 12 - 46 %   Lymphs Abs 0.5 (*) 0.7 - 4.0 K/uL   Monocytes Relative 7  3 - 12 %   Monocytes Absolute 0.3  0.1 - 1.0 K/uL   Eosinophils Relative 0  0 - 5 %   Eosinophils Absolute 0.0  0.0 - 0.7 K/uL   Basophils Relative 0  0 - 1 %   Basophils Absolute 0.0  0.0 - 0.1 K/uL  COMPREHENSIVE METABOLIC PANEL     Status: Abnormal   Collection Time      12/12/12  5:30 PM      Result Value Range   Sodium 136  135 - 145 mEq/L  Potassium 3.8  3.5 - 5.1 mEq/L   Chloride 102  96 - 112 mEq/L   CO2 20  19 - 32 mEq/L   Glucose, Bld 120 (*) 70 - 99 mg/dL   BUN 39 (*) 6 - 23 mg/dL   Creatinine, Ser 1.61 (*) 0.50 - 1.35 mg/dL   Calcium 8.8  8.4 - 09.6 mg/dL   Total Protein 7.2  6.0 - 8.3 g/dL   Albumin 3.1 (*) 3.5 - 5.2 g/dL   AST 42 (*) 0 - 37 U/L   ALT 31  0 - 53 U/L   Alkaline Phosphatase 98  39 - 117 U/L   Total Bilirubin 0.7  0.3 - 1.2 mg/dL   GFR calc non Af Amer 29 (*) >90 mL/min   GFR calc Af Amer 33 (*) >90 mL/min   Comment: (NOTE)     The eGFR has been calculated using the CKD EPI equation.     This calculation has not been validated in all clinical situations.     eGFR's persistently <90 mL/min signify possible Chronic Kidney     Disease.  LIPASE, BLOOD     Status: None   Collection Time    12/12/12  5:30 PM      Result Value Range   Lipase 27  11 - 59 U/L  RETICULOCYTES     Status: Abnormal   Collection Time    12/12/12  5:30 PM      Result Value Range   Retic Ct Pct 0.8  0.4 - 3.1 %   RBC. 4.08 (*) 4.22 - 5.81 MIL/uL   Retic Count, Manual 32.6  19.0 - 186.0 K/uL  HAPTOGLOBIN     Status: None   Collection Time    12/12/12  5:30 PM      Result Value Range   Haptoglobin 100  45 - 215 mg/dL   Comment: Performed at [facility]  LACTATE DEHYDROGENASE     Status: None   Collection Time    12/12/12  5:30 PM      Result Value Range   LDH 193  94 - 250 U/L  POCT I-STAT TROPONIN I     Status: None   Collection Time    12/12/12  6:12 PM      Result Value Range   Troponin i, poc 0.06  0.00 - 0.08 ng/mL   Comment 3            Comment: Due to the release kinetics of cTnI,     a negative result within the first hours     of the onset of symptoms does not rule out     myocardial infarction with certainty.     If myocardial infarction is still suspected,     repeat the test at appropriate intervals.   URINALYSIS, ROUTINE W REFLEX MICROSCOPIC     Status: None   Collection Time    12/12/12  6:56 PM      Result Value Range   Color, Urine YELLOW  YELLOW   APPearance CLEAR  CLEAR   Specific Gravity, Urine 1.010  1.005 - 1.030   pH 5.0  5.0 - 8.0   Glucose, UA NEGATIVE  NEGATIVE mg/dL   Hgb urine dipstick NEGATIVE  NEGATIVE   Bilirubin Urine NEGATIVE  NEGATIVE   Ketones, ur NEGATIVE  NEGATIVE mg/dL   Protein, ur NEGATIVE  NEGATIVE mg/dL   Urobilinogen, UA 0.2  0.0 - 1.0 mg/dL   Nitrite NEGATIVE  NEGATIVE  Leukocytes, UA NEGATIVE  NEGATIVE   Comment: MICROSCOPIC NOT DONE ON URINES WITH NEGATIVE PROTEIN, BLOOD, LEUKOCYTES, NITRITE, OR GLUCOSE <1000 mg/dL.  GLUCOSE, CAPILLARY     Status: Abnormal   Collection Time    12/12/12 11:54 PM      Result Value Range   Glucose-Capillary 108 (*) 70 - 99 mg/dL  SEDIMENTATION RATE     Status: None   Collection Time    12/13/12 12:30 AM      Result Value Range   Sed Rate 1  0 - 16 mm/hr  C-REACTIVE PROTEIN     Status: Abnormal   Collection Time    12/13/12 12:30 AM      Result Value Range   CRP 8.5 (*) <0.60 mg/dL   Comment: Performed at [facility]  TSH     Status: None   Collection Time    12/13/12 12:30 AM      Result Value Range   TSH 2.438  0.350 - 4.500 uIU/mL   Comment: Performed at [facility]  T4, FREE     Status: None   Collection Time    12/13/12 12:30 AM      Result Value Range   Free T4 1.30  0.80 - 1.80 ng/dL   Comment: Performed at [facility]  RHEUMATOID FACTOR     Status: Abnormal   Collection Time    12/13/12 12:30 AM      Result Value Range   Rheumatoid Factor 96 (*) <=14 IU/mL   Comment: (NOTE)                             Interpretive Table                        Low Positive: 15 - 41 IU/mL                        High Positive:  >= 42 IU/mL     In addition to the RF result, and clinical symptoms including joint     involvement, the 2010 ACR Classification Criteria for      scoring/diagnosing Rheumatoid Arthritis include the results of the     following tests:  CRP (16109), ESR (15010), and CCP (APCA) (60454).     www.rheumatology.org/practice/clinical/classification/ra/ra_2010.asp     Performed at [facility]  CULTURE, BLOOD (ROUTINE X 2)     Status: None   Collection Time    12/13/12 12:35 AM      Result Value Range   Specimen Description BLOOD RIGHT WRIST     Special Requests BOTTLES DRAWN AEROBIC ONLY 10CC     Culture  Setup Time       Value: 12/13/2012 05:48     Performed at [facility]   Culture       Value: STREPTOCOCCUS SPECIES     Note: Gram Stain Report Called to,Read Back By and Verified With: Foye Spurling RN 12/13/2012 1915PM     Performed at [facility]   Report Status PENDING    CULTURE, BLOOD (ROUTINE X 2)     Status: None   Collection Time    12/13/12  2:45 AM      Result Value Range   Specimen Description BLOOD RIGHT ARM     Special Requests BOTTLES DRAWN AEROBIC AND ANAEROBIC 10CC EACH     Culture  Setup Time       Value: 12/13/2012 14:28     Performed at [facility]   Culture       Value: STREPTOCOCCUS SPECIES     Note: Gram Stain Report Called to,Read Back By and Verified With: [name] RN on 12/14/12 at 03:15 by Christie Nottingham     Performed at [facility]   Report Status PENDING    CBC WITH DIFFERENTIAL     Status: Abnormal   Collection Time    12/13/12  5:20 AM      Result Value Range   WBC 1.9 (*) 4.0 - 10.5 K/uL   RBC 3.19 (*) 4.22 - 5.81 MIL/uL   Hemoglobin 9.0 (*) 13.0 - 17.0 g/dL   Comment: REPEATED TO VERIFY   HCT 26.6 (*) 39.0 - 52.0 %   MCV 83.4  78.0 - 100.0 fL   MCH 28.2  26.0 - 34.0 pg   MCHC 33.8  30.0 - 36.0 g/dL   RDW 16.1  09.6 - 04.5 %   Platelets 40 (*) 150 - 400 K/uL   Comment: REPEATED TO VERIFY     SPECIMEN CHECKED FOR CLOTS   Neutrophils Relative % 75  43 - 77 %   Neutro Abs 1.4 (*) 1.7 - 7.7 K/uL   Lymphocytes Relative 14  12 - 46 %   Lymphs Abs  0.3 (*) 0.7 - 4.0 K/uL   Monocytes Relative 11  3 - 12 %   Monocytes Absolute 0.2  0.1 - 1.0 K/uL   Eosinophils Relative 0  0 - 5 %   Eosinophils Absolute 0.0  0.0 - 0.7 K/uL   Basophils Relative 0  0 - 1 %   Basophils Absolute 0.0  0.0 - 0.1 K/uL  COMPREHENSIVE METABOLIC PANEL     Status: Abnormal   Collection Time    12/13/12  5:20 AM      Result Value Range   Sodium 134 (*) 135 - 145 mEq/L   Potassium 3.5  3.5 - 5.1 mEq/L   Chloride 102  96 - 112 mEq/L   CO2 19  19 - 32 mEq/L   Glucose, Bld 106 (*) 70 - 99 mg/dL   BUN 37 (*) 6 - 23 mg/dL   Creatinine, Ser 4.09 (*) 0.50 - 1.35 mg/dL   Calcium 8.2 (*) 8.4 - 10.5 mg/dL   Total Protein 5.7 (*) 6.0 - 8.3 g/dL   Albumin 2.6 (*) 3.5 - 5.2 g/dL   AST 29  0 - 37 U/L   ALT 22  0 - 53 U/L   Alkaline Phosphatase 80  39 - 117 U/L   Total Bilirubin 0.6  0.3 - 1.2 mg/dL   GFR calc non Af Amer 30 (*) >90 mL/min   GFR calc Af Amer 34 (*) >90 mL/min   Comment: (NOTE)     The eGFR has been calculated using the CKD EPI equation.     This calculation has not been validated in all clinical situations.     eGFR's persistently <90 mL/min signify possible Chronic Kidney     Disease.  PROTIME-INR     Status: Abnormal   Collection Time    12/13/12  5:20 AM      Result Value Range   Prothrombin Time 16.2 (*) 11.6 - 15.2 seconds   INR 1.33  0.00 - 1.49  PRO B NATRIURETIC PEPTIDE     Status: Abnormal   Collection Time    12/13/12  5:20 AM      Result Value Range   Pro B Natriuretic peptide (BNP) 4856.0 (*) 0 - 450 pg/mL  GLUCOSE, CAPILLARY     Status: Abnormal   Collection Time    12/13/12  8:38 AM      Result Value Range   Glucose-Capillary 118 (*) 70 - 99 mg/dL  GLUCOSE, CAPILLARY     Status: Abnormal   Collection Time    12/14/12  7:51 AM      Result Value Range   Glucose-Capillary 110 (*) 70 - 99 mg/dL      Component Value Date/Time   SDES BLOOD RIGHT ARM 12/13/2012 0245   SPECREQUEST BOTTLES DRAWN AEROBIC AND ANAEROBIC 10CC EACH  12/13/2012 0245   CULT  Value: STREPTOCOCCUS SPECIES Note: Gram Stain Report Called to,Read Back By and Verified With: [name] RN on 12/14/12 at 03:15 by Christie Nottingham Performed at [facility] 12/13/2012 0245   REPTSTATUS PENDING 12/13/2012 0245   Dg Chest 2 View  12/12/2012   CLINICAL DATA:  Weakness.  EXAM: CHEST  2 VIEW  COMPARISON:  12/08/2012.  FINDINGS: Mildly enlarged cardiac silhouette. Clear lungs. Stable tiny calcified granulomata on the left. Stable calcified hilar lymph nodes. The aorta remains tortuous. Diffuse osteopenia. Upper abdominal surgical clips on the right compatible with a previous cholecystectomy if.  IMPRESSION: 1. No acute abnormality. 2. Mild cardiomegaly. 3. Previous granulomatous infection.   Electronically Signed   By: Gordan Payment M.D.   On: 12/12/2012 19:25   US Abdomen Complete  12/13/2012   CLINICAL DATA:  Left upper quadrant pain, history of splenomegaly  EXAM: ULTRASOUND ABDOMEN COMPLETE  COMPARISON:  CT abdomen/ pelvis 06/14/2007  FINDINGS: Gallbladder:  The gallbladder is surgically absent.  Common bile duct:  Diameter: Within normal limits at 4.6 mm  Liver:  No focal lesion identified. Within normal limits in parenchymal echogenicity. Trace perihepatic ascites.  IVC:  No abnormality visualized.  Pancreas:  Visualized portion unremarkable.  Spleen:  Marked splenomegaly. The splenic volume is approximated at 3781 cc and the spleen measures approximately 25 cm in craniocaudal dimension.  Right Kidney:  Length: 11.8 cm. Diffuse cortical thinning. The remaining renal parenchyma appears echogenic. There are multiple sonographically simple cysts, the largest measuring up to 5.4 cm exophytic from the lower pole.  Left Kidney:  Length: 13.9 cm. Diffuse cortical thinning. The remaining renal parenchyma appears echogenic. Sonographically simple renal cysts. The largest measures 6.5 cm exophytic from the lower pole.  Abdominal aorta:  No aneurysm visualized.  Partially  obscured by overlying bowel gas.  Other findings:  None.  IMPRESSION: 1. The marked splenomegaly. The splenic volume is estimated at 3781 cc. Comparing across modalities to the prior CT of the abdomen and pelvis from June of 2009, the splenic size is similar to mildly enlarged. 2. Trace perihepatic ascites. 3. Bilateral renal cortical thinning with echogenic parenchyma suggesting underlying medical renal disease. 4. Bilateral large but sonographically simple renal cysts. On the left, an exophytic cyst from the lower pole measures up to 6.5 cm.   Electronically Signed   By: Malachy Moan M.D.   On: 12/13/2012 08:20     Recent Results (from the past 720 hour(s))  CULTURE, BLOOD (ROUTINE X 2)     Status: None   Collection Time    12/13/12 12:35 AM      Result Value Range Status   Specimen Description BLOOD RIGHT WRIST   Final   Special Requests BOTTLES DRAWN AEROBIC ONLY  10CC   Final   Culture  Setup Time     Final   Value: 12/13/2012 05:48     Performed at [facility]   Culture     Final   Value: STREPTOCOCCUS SPECIES     Note: Gram Stain Report Called to,Read Back By and Verified With: Foye Spurling RN 12/13/2012 1915PM     Performed at [facility]   Report Status PENDING   Incomplete  CULTURE, BLOOD (ROUTINE X 2)     Status: None   Collection Time    12/13/12  2:45 AM      Result Value Range Status   Specimen Description BLOOD RIGHT ARM   Final   Special Requests BOTTLES DRAWN AEROBIC AND ANAEROBIC 10CC EACH   Final   Culture  Setup Time     Final   Value: 12/13/2012 14:28     Performed at [facility]   Culture     Final   Value: STREPTOCOCCUS SPECIES     Note: Gram Stain Report Called to,Read Back By and Verified With: [name] RN on 12/14/12 at 03:15 by Christie Nottingham     Performed at [facility]   Report Status PENDING   Incomplete     Impression/Recommendation  79 year old man with Thrombocytopenia atrial fibrillation on amiodarone  aortic stenosis status post aortic valve replacement in 2013 now with streptococcal bacteremia with 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 I doubt he would be a very good surgical candidate consider he would be a redo prosthetic aortic valve and he had to be done within on orthodox approach at [facility] and he does crack have multiple other comorbid disease including his chronic thrombocytopenia.  --Repeat blood cultures  --When he is able to stand for sufficient time would obtain a Panorex look at his teeth he still has lower teeth in place.  --He is likely going to require 4 if not 6 weeks of IV antibiotics, Please hold off on placing a long-term IV access until we have documented clearance of his blood cultures.  #2 chronic kidney disease but to watch his creatinine there he carefully especially if he requires vancomycin and gentamicin.  #3 chronic thrombocytopenia he has multiple ecchymoses from this after a careful if he has any invasive procedures performed.  Dr. Ninetta Lights is back tomorrow.    Thank you so much for this interesting consult  [facility] [phone] (pager) [phone] (office) 12/14/2012, 12:58 PM  Paulette Blanch Dam 12/14/2012, 12:58 PM   ADDENDUM: clarified with lab this is NOT an Enterococcus in Blood, more likely a viridans type streptococcus  THerefore I will dc the vancomycin and also gentamicin until we know more about the PCN MIC for this organism

## 2012-12-15 ENCOUNTER — Encounter (HOSPITAL_COMMUNITY)
Admission: EM | Disposition: A | Discharge status: Home Health | Payer: Self-pay | Source: Home / Self Care | Attending: Internal Medicine

## 2012-12-15 ENCOUNTER — Encounter (HOSPITAL_COMMUNITY): Payer: Self-pay | Admitting: *Deleted

## 2012-12-15 DIAGNOSIS — I059 Rheumatic mitral valve disease, unspecified: Secondary | ICD-10-CM

## 2012-12-15 DIAGNOSIS — R7881 Bacteremia: Secondary | ICD-10-CM

## 2012-12-15 DIAGNOSIS — B952 Enterococcus as the cause of diseases classified elsewhere: Secondary | ICD-10-CM

## 2012-12-15 DIAGNOSIS — E43 Unspecified severe protein-calorie malnutrition: Secondary | ICD-10-CM

## 2012-12-15 DIAGNOSIS — A491 Streptococcal infection, unspecified site: Secondary | ICD-10-CM

## 2012-12-15 DIAGNOSIS — T827XXA Infection and inflammatory reaction due to other cardiac and vascular devices, implants and grafts, initial encounter: Secondary | ICD-10-CM

## 2012-12-15 DIAGNOSIS — D693 Immune thrombocytopenic purpura: Secondary | ICD-10-CM

## 2012-12-15 DIAGNOSIS — N289 Disorder of kidney and ureter, unspecified: Secondary | ICD-10-CM

## 2012-12-15 HISTORY — PX: TEE WITHOUT CARDIOVERSION: SHX5443

## 2012-12-15 LAB — HEPATITIS PANEL, ACUTE
HCV Ab: NEGATIVE
Hep A IgM: NONREACTIVE
Hep B C IgM: NONREACTIVE
Hepatitis B Surface Ag: NEGATIVE

## 2012-12-15 LAB — CULTURE, BLOOD (ROUTINE X 2)

## 2012-12-15 LAB — BASIC METABOLIC PANEL
CO2: 20 mEq/L (ref 19–32)
Chloride: 105 mEq/L (ref 96–112)
Potassium: 3.8 mEq/L (ref 3.5–5.1)
Sodium: 135 mEq/L (ref 135–145)

## 2012-12-15 LAB — CBC
Platelets: 48 10*3/uL — ABNORMAL LOW (ref 150–400)
RBC: 3.48 MIL/uL — ABNORMAL LOW (ref 4.22–5.81)
WBC: 2.8 10*3/uL — ABNORMAL LOW (ref 4.0–10.5)

## 2012-12-15 SURGERY — ECHOCARDIOGRAM, TRANSESOPHAGEAL
Anesthesia: Moderate Sedation

## 2012-12-15 MED ORDER — MIDAZOLAM HCL 10 MG/2ML IJ SOLN
INTRAMUSCULAR | Status: DC | PRN
  Administered 2012-12-15: 1 mg via INTRAVENOUS
  Administered 2012-12-15: 2 mg via INTRAVENOUS

## 2012-12-15 MED ORDER — MIDAZOLAM HCL 5 MG/ML IJ SOLN
INTRAMUSCULAR | Status: AC
  Filled 2012-12-15: qty 2

## 2012-12-15 MED ORDER — FENTANYL CITRATE 0.05 MG/ML IJ SOLN
INTRAMUSCULAR | Status: AC
  Filled 2012-12-15: qty 2

## 2012-12-15 MED ORDER — BUTAMBEN-TETRACAINE-BENZOCAINE 2-2-14 % EX AERO
INHALATION_SPRAY | CUTANEOUS | Status: DC | PRN
  Administered 2012-12-15: 2 via TOPICAL

## 2012-12-15 MED ORDER — FENTANYL CITRATE 0.05 MG/ML IJ SOLN
INTRAMUSCULAR | Status: DC | PRN
  Administered 2012-12-15 (×2): 25 ug via INTRAVENOUS

## 2012-12-15 MED ORDER — SODIUM CHLORIDE 0.9 % IV SOLN: INTRAVENOUS | Status: DC

## 2012-12-15 NOTE — CV Procedure (Signed)
     Transesophageal Echocardiogram Note  [name] [id number] 1922-03-13  Procedure: Transesophageal Echocardiogram Indications: Bacteremia  Procedure Details Consent: Obtained Time Out: Verified patient identification, verified procedure, site/side was marked, verified correct patient position, special equipment/implants available, Radiology Safety Procedures followed,  medications/allergies/relevent history reviewed, required imaging and test results available.  Performed  Medications: Fentanyl: 50 mcg Versed: 3 mg  Left Ventrical:  Normal LV EF, LVH  Mitral Valve: Significant thickening of both leaflets, no echodensity visualized, mitral annular calcifications, mild MR  Aortic Valve: Bioprosthesis functioning well, no central AI or PVL, no vegetation  Tricuspid Valve: Normal, trace TR, no vegetation  Pulmonic Valve: Grossly normal, no PR, no vegetation  Left Atrium/ Left atrial appendage: No thrombus, normal filling velocities  Atrial septum: No PFO by Doppler  Aorta: Severe non-mobile atherosclerotic plague at the visualized portion of the ascending, descending aorta and in the arch   Complications: No apparent complications Patient did tolerate procedure well.  Lars Masson, MD, [facility] 12/15/2012, 3:51 PM

## 2012-12-15 NOTE — Interval H&P Note (Signed)
History and Physical Interval Note:  12/15/2012 2:25 PM  [name]  has presented today for surgery, with the diagnosis of endocaritis  The various methods of treatment have been discussed with the patient and family. After consideration of risks, benefits and other options for treatment, the patient has consented to  Procedure(s): TRANSESOPHAGEAL ECHOCARDIOGRAM (TEE) (N/A) as a surgical intervention .  [name] history has been reviewed, patient examined, no change in status, stable for surgery.  I have reviewed the patient's chart and labs.  Questions were answered to the patient's satisfaction.     Tobias Alexander, H

## 2012-12-15 NOTE — Progress Notes (Signed)
IP PROGRESS NOTE  Subjective:   [name] is well-known to me with a chronic history of splenomegaly and pancytopenia. We saw him last week with a complaint of malaise. The hemoglobin was lower. We felt the anemia could be related to the chronic hematologic condition or renal insufficiency. He was transfused with packed red blood cells on 12/11/2012. [name] reports that he continues to feel poorly. He was admitted on 12/12/2012. Blood cultures from hospital admission returned positive for Streptococcus.  Objective: Vital signs in last 24 hours: Blood pressure 96/57, pulse 69, temperature 98.1 F (36.7 C), temperature source Oral, resp. rate 18, height 5' 10.5" (1.791 m), weight 186 lb 4.6 oz (84.5 kg), SpO2 98.00%.  Intake/Output from previous day: 12/07 0701 - 12/08 0700 In: 100 [IV Piggyback:100] Out: -   Physical Exam:  Lungs: Decreased breath sounds at the bases, no respiratory distress Cardiac: Regular rate and rhythm, 2/6 systolic murmur Abdomen: The spleen tip is palpable in the left lateral abdomen. Tender to deep palpation in the left lateral abdomen. No hepatomegaly. Extremities: Trace pedal edema bilaterally    Lab Results:  Recent Labs  12/14/12 1300 12/15/12 0655  WBC 2.5* 2.8*  HGB 9.2* 9.7*  HCT 27.6* 29.1*  PLT 55* 48*   ANC 1.4 on 12/13/2012  BMET  Recent Labs  12/14/12 1300 12/15/12 0655  NA 130* 135  K 3.3* 3.8  CL 101 105  CO2 18* 20  GLUCOSE 173* 118*  BUN 41* 39*  CREATININE 1.86* 1.90*  CALCIUM 8.0* 8.5    Studies/Results: No results found.  Medications: I have reviewed the patient's current medications.  Assessment/Plan: 1. Chronic pancytopenia/splenomegaly-likely related to a chronic lymphoproliferative disorder,? Splenic lymphoma  2. History of severe aortic stenosis, status post aortic valve replacement surgery at [facility] on 01/23/2011  3. History of gout  4. Diabetes  5. 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 Malaise-likely secondary to #5  7. Progressive anemia-? Secondary to bacteremia, status post a red cell transfusion 12/11/2012  8. Renal insufficiency  I suspect his symptoms are related to the bacteremia. He is now being treated with antibiotics per the infectious disease service and will undergo a cardiology evaluation. The thrombocytopenia has responded to nplate therapy in the past.  Recommendations:  1. Management of endocarditis per infectious disease/cardiology  2. Please contact me if a cardiac intervention procedure is required. We can prescribe nplate to raise the platelet count.      LOS: 3 days   Kamyla Olejnik  12/15/2012, 12:33 PM   

## 2012-12-15 NOTE — Evaluation (Signed)
Occupational Therapy Evaluation Patient Details Name: [name] MRN: [id number] DOB: April 30, 1922 Today's Date: 12/15/2012 Time: 4098-1191 OT Time Calculation (min): 15 min  OT Assessment / Plan / Recommendation History of present illness  79 y.o. male admitted with fatigue and lethargy for x 4 weeks. PT with hx of aortic wall replacement, atrial fibrillation, CVA, hypertension, diabetes mellitus, throat cancer, hypothyroidism, and splenomegaly.    Clinical Impression   PT admitted with fatigue and . Pt currently with functional limitiations due to the deficits listed below (see OT problem list).  Pt will benefit from skilled OT to increase their independence and safety with adls and balance to allow discharge HHOT.     OT Assessment  Patient needs continued OT Services    Follow Up Recommendations  Home health OT    Barriers to Discharge      Equipment Recommendations  Other (comment);3 in 1 bedside comode (RW)    Recommendations for Other Services    Frequency  Min 2X/week    Precautions / Restrictions Precautions Precautions: Fall   Pertinent Vitals/Pain Feel "horrible"    ADL  Grooming: Wash/dry hands;Min guard Where Assessed - Grooming: Supported standing Toilet Transfer: Minimal Dentist Method: Sit to Barista: Regular height toilet;Grab bars Toileting - Architect and Hygiene: Min guard Where Assessed - Engineer, mining and Hygiene: Sit to stand from 3-in-1 or toilet Equipment Used: Gait belt Transfers/Ambulation Related to ADLs: pt ambulating with min (A) hand held (A) ADL Comments: Pt completed toilet transfer with hand held (A) and reports fatigue. pt washing hands at sink and vitals stable during all activity. Pt reports feeling fatigued but no specific pain. Session ending after grooming at sink due to pending TEE. Daughters x3 retired and able to assist wife with care upon d/c.     OT  Diagnosis: Generalized weakness  OT Problem List: Decreased strength;Decreased activity tolerance;Impaired balance (sitting and/or standing);Decreased safety awareness;Decreased knowledge of precautions;Decreased knowledge of use of DME or AE OT Treatment Interventions: Self-care/ADL training;Therapeutic exercise;DME and/or AE instruction;Therapeutic activities;Patient/family education;Balance training   OT Goals(Current goals can be found in the care plan section) Acute Rehab OT Goals Patient Stated Goal: to return to deer hunting and [place] the beach OT Goal Formulation: With patient/family Time For Goal Achievement: 12/29/12 Potential to Achieve Goals: Good ADL Goals Pt Will Perform Grooming: with modified independence;standing Pt Will Perform Upper Body Bathing: with modified independence;standing Pt Will Perform Lower Body Bathing: with modified independence;sit to/from stand Pt Will Transfer to Toilet: with modified independence;bedside commode Pt Will Perform Toileting - Clothing Manipulation and hygiene: with modified independence;sit to/from stand  Visit Information  Last OT Received On: 12/15/12 Assistance Needed: +1 History of Present Illness:  79 y.o. male admitted with fatigue and lethargy for x 4 weeks. PT with hx of aortic wall replacement, atrial fibrillation, CVA, hypertension, diabetes mellitus, throat cancer, hypothyroidism, and splenomegaly.        Prior Functioning     Home Living Family/patient expects to be discharged to:: Private residence Living Arrangements: Spouse/significant other Available Help at Discharge: Family Type of Home: House Home Access: Stairs to enter Secretary/administrator of Steps: 3 Entrance Stairs-Rails: Right Home Layout: One level Home Equipment: None Prior Function Level of Independence: Independent Comments: drives Communication Communication: No difficulties Dominant Hand: Right         Vision/Perception Vision -  History Baseline Vision: Wears glasses only for reading Patient Visual Report: No change from baseline  Cognition  Cognition Arousal/Alertness: Awake/alert Behavior During Therapy: WFL for tasks assessed/performed Overall Cognitive Status: Within Functional Limits for tasks assessed    Extremity/Trunk Assessment Upper Extremity Assessment Upper Extremity Assessment: Overall WFL for tasks assessed Lower Extremity Assessment Lower Extremity Assessment: Defer to PT evaluation Cervical / Trunk Assessment Cervical / Trunk Assessment: Normal     Mobility Bed Mobility Bed Mobility: Supine to Sit;Sitting - Scoot to Edge of Bed;Sit to Supine Supine to Sit: 4: Min guard;With rails Sitting - Scoot to Edge of Bed: 4: Min guard Sit to Supine: 4: Min guard;With rail Details for Bed Mobility Assistance: needed extended time Transfers Transfers: Sit to Stand;Stand to Sit Sit to Stand: 4: Min assist;With upper extremity assist;From bed Stand to Sit: 4: Min assist;With upper extremity assist;To bed Details for Transfer Assistance: Pt ambulated min (A) hand held (A)     Exercise     Balance     End of Session OT - End of Session Activity Tolerance: Patient tolerated treatment well Patient left: in bed;with call bell/phone within reach;with family/visitor present Nurse Communication: Mobility status;Precautions  GO     Harolyn Rutherford 12/15/2012, 2:39 PM Pager: [phone]

## 2012-12-15 NOTE — Progress Notes (Signed)
[facility] PROGRESS NOTE  SHERON ROBIN ZOX:[id number] DOB: 1922/03/07 DOA: 12/12/2012 PCP: [name], MD  Assessment/Plan: 1. Suspected Endocarditis. Patient with a history of aortic valve replacement with bioprosthesis, performed in 2013 at [facility], admitted for generalized weakness and fatigue. Blood cultures that were drawn on 12/13/2012 have now come back positive for Streptococcus species on both sets. I have discussed case with both cardiology and infectious disease. Patient is having transesophageal echocardiogram now (12/8).   Dr. [name] recommending continuing Rocephin as this is most likely strep viridans.  No obvious source of infection.  ID recommends a Panorex when he can stand for a period of time. 2. Chronic pancytopenia. Patient undergoing multiple bone marrow biopsies which have been unrevealing. Bone marrow biopsy performed on 11/20/2010 showing slightly hypercellular marrow for age, erythroid predominant trilineage hematopoiesis, a spectrum of maturation in all 3 hematopoietic leakages, negative for dysplasia negative for lymphoma. Dr [name] consulted.  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  He notes the patient's thrombocytopenia has responded to nplate therapy in the past. 3. Stage III chronic kidney disease. Patient's baseline creatinine near 2.0. A.m. lab work performed on 12/13/2012 showing a creatinine of 1.9. 4. History of aortic valve replacement, procedure performed at [facility] on 01/23/2011. Blood cultures have now grown Streptococcus species x2 sets. Both cardiology and infectious disease consulted.  5. Hypertension. Continue amlodipine 5 mg by mouth daily and hydralazine 50 mg by mouth 3 times a day. 6. Hypothyroidism. Continue Synthroid 25 mcg by mouth daily. Will check a TSH  Code Status: Full Code Family Communication: [name], daughter and  adopted son updated Disposition Plan: Home when medically appropriate.  Will need HHPT, OT, RN   Consultants:  MedOnc  Cardiology  ID  Antibiotics:  Vanc  HPI/Subjective: Patient is an 79 year old woman with a past medical history of age of fibrillation, aortic valve replacement, presented with a four-week history of generalized weakness, malaise, fatigue and poor tolerance to physical exertion. He has a history of pancytopenia undergoing bone marrow biopsies in the past which have been unrevealing.  On 12/15/12 - patient is complaining about dry mouth (NPO for TEE).  He denies being hungry and states his family has had to force him to eat for the past month.  Objective: Filed Vitals:   12/15/12 1455  BP: 120/54  Pulse: 65  Temp:   Resp: 12    Intake/Output Summary (Last 24 hours) at 12/15/12 1502 Last data filed at 12/15/12 1400  Gross per 24 hour  Intake      0 ml  Output      0 ml  Net      0 ml   Filed Weights   12/12/12 1734 12/12/12 2335 12/15/12 0500  Weight: 85.276 kg (188 lb) 84.4 kg (186 lb 1.1 oz) 84.5 kg (186 lb 4.6 oz)    Exam:   General:  Awake, Appropriate, Chronically ill appearing, no acute distress, multiple family members at bedside.  Cardiovascular: 3/6 systolic ejection murmur, no rubs or gallops  Respiratory: Normal respiratory effort, no wheezing rhonchi rales  Abdomen: Soft nontender nontender positive bowel sound  Musculoskeletal: Patient having 1+ bilateral pedal edema, SCDs in place  Data Reviewed: Basic Metabolic Panel:  Recent Labs Lab 12/12/12 1730 12/13/12 0520 12/14/12 1300 12/15/12 0655  NA 136 134* 130* 135  K 3.8 3.5 3.3* 3.8  CL 102 102 101 105  CO2 20 19 18* 20  GLUCOSE 120* 106* 173* 118*  BUN 39* 37* 41* 39*  CREATININE 1.97* 1.91* 1.86* 1.90*  CALCIUM 8.8 8.2* 8.0* 8.5   Liver Function Tests:  Recent Labs Lab 12/12/12 1730 12/13/12 0520  AST 42* 29  ALT 31 22  ALKPHOS 98 80  BILITOT 0.7 0.6  PROT  7.2 5.7*  ALBUMIN 3.1* 2.6*    Recent Labs Lab 12/12/12 1730  LIPASE 27   CBC:  Recent Labs Lab 12/12/12 1730 12/13/12 0520 12/14/12 1300 12/15/12 0655  WBC 4.3 1.9* 2.5* 2.8*  NEUTROABS 3.5 1.4*  --   --   HGB 11.6* 9.0* 9.2* 9.7*  HCT 34.4* 26.6* 27.6* 29.1*  MCV 84.7 83.4 84.9 83.6  PLT 50* 40* 55* 48*   BNP (last 3 results)  Recent Labs  02/14/12 1729 12/13/12 0520  PROBNP 404.0* 4856.0*   CBG:  Recent Labs Lab 12/12/12 2354 12/13/12 0838 12/14/12 0751 12/15/12 0817  GLUCAP 108* 118* 110* 108*    Recent Results (from the past 240 hour(s))  CULTURE, BLOOD (ROUTINE X 2)     Status: None   Collection Time    12/13/12 12:35 AM      Result Value Range Status   Specimen Description BLOOD RIGHT WRIST   Final   Special Requests BOTTLES DRAWN AEROBIC ONLY 10CC   Final   Culture  Setup Time     Final   Value: 12/13/2012 05:48     Performed at [facility]   Culture     Final   Value: STREPTOCOCCUS GROUP D;high probability for S.bovis     Note: Gram Stain Report Called to,Read Back By and Verified With: Foye Spurling RN 12/13/2012 1915PM     Performed at [facility]   Report Status 12/15/2012 FINAL   Final   Organism ID, Bacteria STREPTOCOCCUS GROUP D;high probability for S.bovis   Final  CULTURE, BLOOD (ROUTINE X 2)     Status: None   Collection Time    12/13/12  2:45 AM      Result Value Range Status   Specimen Description BLOOD RIGHT ARM   Final   Special Requests BOTTLES DRAWN AEROBIC AND ANAEROBIC 10CC EACH   Final   Culture  Setup Time     Final   Value: 12/13/2012 14:28     Performed at [facility]   Culture     Final   Value: STREPTOCOCCUS GROUP D;high probability for S.bovis     Note: SUSCEPTIBILITIES PERFORMED ON PREVIOUS CULTURE WITHIN THE LAST 5 DAYS.     Note: Gram Stain Report Called to,Read Back By and Verified With: [name] RN on 12/14/12 at 03:15 by Christie Nottingham     Performed at [facility]    Report Status 12/15/2012 FINAL   Final     Studies: No results found.  Scheduled Meds: . [facility] HOLD] allopurinol  100 mg Oral Daily  . [facility] HOLD] amiodarone  100 mg Oral Daily  . [MAR HOLD] amLODipine  5 mg Oral Daily  . [facility] HOLD] aspirin  325 mg Oral Daily  . [MAR HOLD] cefTRIAXone (ROCEPHIN)  IV  2 g Intravenous Q24H  . [MAR HOLD] feeding supplement (RESOURCE BREEZE)  1 Container Oral TID BM  . [facility] HOLD] furosemide  80 mg Oral Daily  . [MAR HOLD] hydrALAZINE  50 mg Oral TID  . [facility] HOLD] levothyroxine  25 mcg Oral QAC breakfast  . [MAR HOLD] metoprolol tartrate  12.5 mg Oral Daily  . [  MAR HOLD] pantoprazole  40 mg Oral Daily  . [facility] HOLD] potassium chloride  10 mEq Oral Daily  . [MAR HOLD] sodium chloride  3 mL Intravenous Q12H   Continuous Infusions: . sodium chloride      Principal Problem:   Lethargy Active Problems:   THROAT CANCER   DIABETES MELLITUS, CONTROLLED   HYPERTENSION   Weakness   S/P AVR   Protein-calorie malnutrition, severe    Time spent: 35 minutes    Conley Canal  [facility] Pager [phone]. If 7PM-7AM, please contact night-coverage at [email or web address], password [facility] 12/15/2012, 3:02 PM  LOS: 3 days   Addendum  I personally examined patient on 12/15/12, agree with the above findings. Plan was formulated with Algis Downs PA. Patient continues to feel about the same. He is scheduled to undergo TEE today. Has blood cultures growing Streptococcus Group D. ID following. Plan to place PICC once Blood Cultures are sterile as he will likely require long term IV AB's.

## 2012-12-15 NOTE — Progress Notes (Addendum)
INFECTIOUS DISEASE PROGRESS NOTE  ID: [name] is a 79 y.o. male with  Principal Problem:   Lethargy Active Problems:   THROAT CANCER   DIABETES MELLITUS, CONTROLLED   HYPERTENSION   Weakness   S/P AVR   Protein-calorie malnutrition, severe  Subjective: Resting quietly  Abtx:  Anti-infectives   Start     Dose/Rate Route Frequency Ordered Stop   12/14/12 1400  gentamicin (GARAMYCIN) IVPB 80 mg  Status:  Discontinued     80 mg 100 mL/hr over 30 Minutes Intravenous Every 24 hours 12/14/12 1259 12/14/12 1640   12/14/12 1300  cefTRIAXone (ROCEPHIN) 2 g in dextrose 5 % 50 mL IVPB     2 g 100 mL/hr over 30 Minutes Intravenous Every 24 hours 12/14/12 1235     12/14/12 0500  vancomycin (VANCOCIN) IVPB 1000 mg/200 mL premix  Status:  Discontinued     1,000 mg 200 mL/hr over 60 Minutes Intravenous Every 24 hours 12/13/12 1355 12/14/12 1637   12/13/12 0500  vancomycin (VANCOCIN) 1,250 mg in sodium chloride 0.9 % 250 mL IVPB  Status:  Discontinued     1,250 mg 166.7 mL/hr over 90 Minutes Intravenous Every 24 hours 12/13/12 0433 12/13/12 1355      Medications:  Scheduled: . allopurinol  100 mg Oral Daily  . amiodarone  100 mg Oral Daily  . amLODipine  5 mg Oral Daily  . aspirin  325 mg Oral Daily  . cefTRIAXone (ROCEPHIN)  IV  2 g Intravenous Q24H  . feeding supplement (RESOURCE BREEZE)  1 Container Oral TID BM  . furosemide  80 mg Oral Daily  . hydrALAZINE  50 mg Oral TID  . levothyroxine  25 mcg Oral QAC breakfast  . metoprolol tartrate  12.5 mg Oral Daily  . pantoprazole  40 mg Oral Daily  . potassium chloride  10 mEq Oral Daily  . sodium chloride  3 mL Intravenous Q12H    Objective: Vital signs in last 24 hours: Temp:  [97.6 F (36.4 C)-98.1 F (36.7 C)] 97.6 F (36.4 C) (12/08 1557) Pulse Rate:  [56-69] 59 (12/08 1620) Resp:  [9-18] 14 (12/08 1620) BP: (96-155)/(46-69) 108/49 mmHg (12/08 1619) SpO2:  [94 %-100 %] 98 % (12/08 1620) Weight:  [84.5 kg (186  lb 4.6 oz)] 84.5 kg (186 lb 4.6 oz) (12/08 0500)   General appearance: no distress and resting quietly.   Lab Results  Recent Labs  12/14/12 1300 12/15/12 0655  WBC 2.5* 2.8*  HGB 9.2* 9.7*  HCT 27.6* 29.1*  NA 130* 135  K 3.3* 3.8  CL 101 105  CO2 18* 20  BUN 41* 39*  CREATININE 1.86* 1.90*   Liver Panel  Recent Labs  12/13/12 0520  PROT 5.7*  ALBUMIN 2.6*  AST 29  ALT 22  ALKPHOS 80  BILITOT 0.6   Sedimentation Rate  Recent Labs  12/13/12 0030  ESRSEDRATE 1   C-Reactive Protein  Recent Labs  12/13/12 0030  CRP 8.5*    Microbiology: Recent Results (from the past 240 hour(s))  CULTURE, BLOOD (ROUTINE X 2)     Status: None   Collection Time    12/13/12 12:35 AM      Result Value Range Status   Specimen Description BLOOD RIGHT WRIST   Final   Special Requests BOTTLES DRAWN AEROBIC ONLY 10CC   Final   Culture  Setup Time     Final   Value: 12/13/2012 05:48     Performed at  [facility]     Final   Value: STREPTOCOCCUS GROUP D;high probability for S.bovis     Note: Gram Stain Report Called to,Read Back By and Verified With: Foye Spurling RN 12/13/2012 1915PM     Performed at [facility]   Report Status 12/15/2012 FINAL   Final   Organism ID, Bacteria STREPTOCOCCUS GROUP D;high probability for S.bovis   Final  CULTURE, BLOOD (ROUTINE X 2)     Status: None   Collection Time    12/13/12  2:45 AM      Result Value Range Status   Specimen Description BLOOD RIGHT ARM   Final   Special Requests BOTTLES DRAWN AEROBIC AND ANAEROBIC 10CC EACH   Final   Culture  Setup Time     Final   Value: 12/13/2012 14:28     Performed at [facility]   Culture     Final   Value: STREPTOCOCCUS GROUP D;high probability for S.bovis     Note: SUSCEPTIBILITIES PERFORMED ON PREVIOUS CULTURE WITHIN THE LAST 5 DAYS.     Note: Gram Stain Report Called to,Read Back By and Verified With: [name] RN on 12/14/12 at 03:15 by Christie Nottingham      Performed at [facility]   Report Status 12/15/2012 FINAL   Final    Studies/Results: No results found.   Assessment/Plan: Streptococcal Bacteremia (Group D, Bovis) AVR ITP Total days of antibiotics: 2 (Ceftriaxone)  TEE (-) today Would repeat his BCx Plan for 2 weeks of rx.  Pt needs colonoscopy, Group D strep is highly associated with Colon Cancer (can be done as outpt).        [name] Infectious Diseases (pager) [phone] [email or web address] 12/15/2012, 6:21 PM  LOS: 3 days

## 2012-12-15 NOTE — H&P (View-Only) (Signed)
IP PROGRESS NOTE  Subjective:   [name] is well-known to me with a chronic history of splenomegaly and pancytopenia. We saw him last week with a complaint of malaise. The hemoglobin was lower. We felt the anemia could be related to the chronic hematologic condition or renal insufficiency. He was transfused with packed red blood cells on 12/11/2012. [name] reports that he continues to feel poorly. He was admitted on 12/12/2012. Blood cultures from hospital admission returned positive for Streptococcus.  Objective: Vital signs in last 24 hours: Blood pressure 96/57, pulse 69, temperature 98.1 F (36.7 C), temperature source Oral, resp. rate 18, height 5' 10.5" (1.791 m), weight 186 lb 4.6 oz (84.5 kg), SpO2 98.00%.  Intake/Output from previous day: 12/07 0701 - 12/08 0700 In: 100 [IV Piggyback:100] Out: -   Physical Exam:  Lungs: Decreased breath sounds at the bases, no respiratory distress Cardiac: Regular rate and rhythm, 2/6 systolic murmur Abdomen: The spleen tip is palpable in the left lateral abdomen. Tender to deep palpation in the left lateral abdomen. No hepatomegaly. Extremities: Trace pedal edema bilaterally    Lab Results:  Recent Labs  12/14/12 1300 12/15/12 0655  WBC 2.5* 2.8*  HGB 9.2* 9.7*  HCT 27.6* 29.1*  PLT 55* 48*   ANC 1.4 on 12/13/2012  BMET  Recent Labs  12/14/12 1300 12/15/12 0655  NA 130* 135  K 3.3* 3.8  CL 101 105  CO2 18* 20  GLUCOSE 173* 118*  BUN 41* 39*  CREATININE 1.86* 1.90*  CALCIUM 8.0* 8.5    Studies/Results: No results found.  Medications: I have reviewed the patient's current medications.  Assessment/Plan: 1. Chronic pancytopenia/splenomegaly-likely related to a chronic lymphoproliferative disorder,? Splenic lymphoma  2. History of severe aortic stenosis, status post aortic valve replacement surgery at [facility] on 01/23/2011  3. History of gout  4. Diabetes  5. 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 Malaise-likely secondary to #5  7. Progressive anemia-? Secondary to bacteremia, status post a red cell transfusion 12/11/2012  8. Renal insufficiency  I suspect his symptoms are related to the bacteremia. He is now being treated with antibiotics per the infectious disease service and will undergo a cardiology evaluation. The thrombocytopenia has responded to nplate therapy in the past.  Recommendations:  1. Management of endocarditis per infectious disease/cardiology  2. Please contact me if a cardiac intervention procedure is required. We can prescribe nplate to raise the platelet count.      LOS: 3 days   Lallie Strahm, Jillyn Hidden  12/15/2012, 12:33 PM

## 2012-12-16 ENCOUNTER — Encounter: Payer: Self-pay | Admitting: Physician Assistant

## 2012-12-16 LAB — CBC
HCT: 31 % — ABNORMAL LOW (ref 39.0–52.0)
Hemoglobin: 10.3 g/dL — ABNORMAL LOW (ref 13.0–17.0)
MCH: 28.1 pg (ref 26.0–34.0)
MCHC: 33.2 g/dL (ref 30.0–36.0)
MCV: 84.5 fL (ref 78.0–100.0)
Platelets: 56 10*3/uL — ABNORMAL LOW (ref 150–400)
RBC: 3.67 MIL/uL — ABNORMAL LOW (ref 4.22–5.81)
RDW: 15.2 % (ref 11.5–15.5)
WBC: 3.6 10*3/uL — ABNORMAL LOW (ref 4.0–10.5)

## 2012-12-16 LAB — BASIC METABOLIC PANEL
BUN: 39 mg/dL — ABNORMAL HIGH (ref 6–23)
CO2: 20 mEq/L (ref 19–32)
Calcium: 8.4 mg/dL (ref 8.4–10.5)
Chloride: 105 mEq/L (ref 96–112)
Creatinine, Ser: 1.9 mg/dL — ABNORMAL HIGH (ref 0.50–1.35)
GFR calc Af Amer: 35 mL/min — ABNORMAL LOW (ref >90)
GFR calc non Af Amer: 30 mL/min — ABNORMAL LOW (ref >90)
Glucose, Bld: 134 mg/dL — ABNORMAL HIGH (ref 70–99)
Potassium: 3.9 mEq/L (ref 3.5–5.1)
Sodium: 138 mEq/L (ref 135–145)

## 2012-12-16 NOTE — Progress Notes (Signed)
Occupational Therapy Treatment Patient Details Name: [name] MRN: [id number] DOB: 1922-05-09 Today's Date: 12/16/2012 Time: 4098-1191 OT Time Calculation (min): 24 min  OT Assessment / Plan / Recommendation  History of present illness  79 y.o. male admitted with fatigue and lethargy for x 4 weeks. PT with hx of aortic wall replacement, atrial fibrillation, CVA, hypertension, diabetes mellitus, throat cancer, hypothyroidism, and splenomegaly.    OT comments  Pt making good progress with functional goals and should continue with acute OT services to increase level of function and safety to return home with his wife  Follow Up Recommendations  Home health OT    Barriers to Discharge   none    Equipment Recommendations  3 in 1 bedside comode    Recommendations for Other Services    Frequency Min 2X/week   Progress towards OT Goals Progress towards OT goals: Progressing toward goals  Plan Discharge plan remains appropriate    Precautions / Restrictions Precautions Precautions: Fall Restrictions Weight Bearing Restrictions: No   Pertinent Vitals/Pain No c/o pain, vitals stable    ADL  Grooming: Wash/dry hands;Min guard;Performed Where Assessed - Grooming: Unsupported standing Upper Body Bathing: Simulated;Min guard Where Assessed - Upper Body Bathing: Supported standing Lower Body Bathing: Simulated;Min guard;Minimal assistance Where Assessed - Lower Body Bathing: Supported sit to Pharmacist, hospital: Research scientist (life sciences) Method: Sit to Barista: Regular height toilet;Grab bars Toileting - Architect and Hygiene: Min guard;Supervision/safety Where Assessed - Engineer, mining and Hygiene: Standing Equipment Used: Gait belt;Rolling walker Transfers/Ambulation Related to ADLs: ambulated HHA from sink back to EOB ADL Comments: Pt and his wife provided with energy conservation education and given  handout    OT Diagnosis:    OT Problem List:   OT Treatment Interventions:     OT Goals(current goals can now be found in the care plan section) Acute Rehab OT Goals Patient Stated Goal: to go home  Visit Information  Last OT Received On: 12/16/12 Assistance Needed: +1 History of Present Illness:  79 y.o. male admitted with fatigue and lethargy for x 4 weeks. PT with hx of aortic wall replacement, atrial fibrillation, CVA, hypertension, diabetes mellitus, throat cancer, hypothyroidism, and splenomegaly.     Subjective Data      Prior Functioning       Cognition  Cognition Arousal/Alertness: Awake/alert Behavior During Therapy: WFL for tasks assessed/performed Overall Cognitive Status: Within Functional Limits for tasks assessed    Mobility  Bed Mobility Bed Mobility: Supine to Sit;Sitting - Scoot to Edge of Bed;Sit to Supine Supine to Sit: With rails;6: Modified independent (Device/Increase time);HOB flat Sitting - Scoot to Edge of Bed: 4: Min guard;6: Modified independent (Device/Increase time) Sit to Supine: 4: Min guard;With rail Transfers Transfers: Sit to Stand;Stand to Sit Sit to Stand: With upper extremity assist;From bed;5: Supervision;From toilet Stand to Sit: With upper extremity assist;To bed;5: Supervision;To toilet       Balance Balance Balance Assessed: Yes Static Sitting Balance Static Sitting - Balance Support: No upper extremity supported;Feet supported Static Sitting - Level of Assistance: 7: Independent Dynamic Sitting Balance Dynamic Sitting - Balance Support: Feet unsupported;No upper extremity supported;During functional activity Dynamic Sitting - Level of Assistance: 6: Modified independent (Device/Increase time) Static Standing Balance Static Standing - Balance Support: No upper extremity supported Static Standing - Level of Assistance: 5: Stand by assistancet Dynamic Standing Balance Dynamic Standing - Balance Support: Left upper extremity  supported;No upper extremity supported;During functional activity Dynamic Standing - Level  of Assistance: 4: Min assist;Other (comment) (min guard A)   End of Session OT - End of Session Equipment Utilized During Treatment: Gait belt;Rolling walker Activity Tolerance: Patient tolerated treatment well Patient left: in bed;with call bell/phone within reach;with family/visitor present  GO     Galen Manila 12/16/2012, 1:29 PM

## 2012-12-16 NOTE — Progress Notes (Addendum)
INFECTIOUS DISEASE PROGRESS NOTE  ID: [name] is a 79 y.o. male with  Principal Problem:   Lethargy Active Problems:   THROAT CANCER   DIABETES MELLITUS, CONTROLLED   HYPERTENSION   Weakness   S/P AVR   Protein-calorie malnutrition, severe  Subjective: Without complaints.   Abtx:  Anti-infectives   Start     Dose/Rate Route Frequency Ordered Stop   12/14/12 1400  gentamicin (GARAMYCIN) IVPB 80 mg  Status:  Discontinued     80 mg 100 mL/hr over 30 Minutes Intravenous Every 24 hours 12/14/12 1259 12/14/12 1640   12/14/12 1300  cefTRIAXone (ROCEPHIN) 2 g in dextrose 5 % 50 mL IVPB     2 g 100 mL/hr over 30 Minutes Intravenous Every 24 hours 12/14/12 1235     12/14/12 0500  vancomycin (VANCOCIN) IVPB 1000 mg/200 mL premix  Status:  Discontinued     1,000 mg 200 mL/hr over 60 Minutes Intravenous Every 24 hours 12/13/12 1355 12/14/12 1637   12/13/12 0500  vancomycin (VANCOCIN) 1,250 mg in sodium chloride 0.9 % 250 mL IVPB  Status:  Discontinued     1,250 mg 166.7 mL/hr over 90 Minutes Intravenous Every 24 hours 12/13/12 0433 12/13/12 1355      Medications:  Scheduled: . allopurinol  100 mg Oral Daily  . amiodarone  100 mg Oral Daily  . amLODipine  5 mg Oral Daily  . aspirin  325 mg Oral Daily  . cefTRIAXone (ROCEPHIN)  IV  2 g Intravenous Q24H  . feeding supplement (RESOURCE BREEZE)  1 Container Oral TID BM  . furosemide  80 mg Oral Daily  . hydrALAZINE  50 mg Oral TID  . levothyroxine  25 mcg Oral QAC breakfast  . metoprolol tartrate  12.5 mg Oral Daily  . pantoprazole  40 mg Oral Daily  . potassium chloride  10 mEq Oral Daily  . sodium chloride  3 mL Intravenous Q12H    Objective: Vital signs in last 24 hours: Temp:  [97.6 F (36.4 C)-98.4 F (36.9 C)] 98.2 F (36.8 C) (12/09 0521) Pulse Rate:  [56-74] 69 (12/09 1053) Resp:  [9-18] 18 (12/09 0521) BP: (94-169)/(46-69) 169/57 mmHg (12/09 1053) SpO2:  [94 %-100 %] 97 % (12/09 0521) Weight:  [86.1 kg  (189 lb 13.1 oz)] 86.1 kg (189 lb 13.1 oz) (12/09 0521)   General appearance: alert, cooperative and no distress Resp: clear to auscultation bilaterally Cardio: regular rate and rhythm and systolic murmur: early systolic 3/6, crescendo at 2nd left intercostal space GI: normal findings: bowel sounds normal and soft, non-tender Extremities: edema 3+ pedal  Lab Results  Recent Labs  12/15/12 0655 12/16/12 0613  WBC 2.8* 3.6*  HGB 9.7* 10.3*  HCT 29.1* 31.0*  NA 135 138  K 3.8 3.9  CL 105 105  CO2 20 20  BUN 39* 39*  CREATININE 1.90* 1.90*   Liver Panel No results found for this basename: PROT, ALBUMIN, AST, ALT, ALKPHOS, BILITOT, BILIDIR, IBILI,  in the last 72 hours Sedimentation Rate No results found for this basename: ESRSEDRATE,  in the last 72 hours C-Reactive Protein No results found for this basename: CRP,  in the last 72 hours  Microbiology: Recent Results (from the past 240 hour(s))  CULTURE, BLOOD (ROUTINE X 2)     Status: None   Collection Time    12/13/12 12:35 AM      Result Value Range Status   Specimen Description BLOOD RIGHT WRIST   Final  Special Requests BOTTLES DRAWN AEROBIC ONLY 10CC   Final   Culture  Setup Time     Final   Value: 12/13/2012 05:48     Performed at [facility]   Culture     Final   Value: STREPTOCOCCUS GROUP D;high probability for S.bovis     Note: Gram Stain Report Called to,Read Back By and Verified With: Foye Spurling RN 12/13/2012 1915PM     Performed at [facility]   Report Status 12/15/2012 FINAL   Final   Organism ID, Bacteria STREPTOCOCCUS GROUP D;high probability for S.bovis   Final  CULTURE, BLOOD (ROUTINE X 2)     Status: None   Collection Time    12/13/12  2:45 AM      Result Value Range Status   Specimen Description BLOOD RIGHT ARM   Final   Special Requests BOTTLES DRAWN AEROBIC AND ANAEROBIC 10CC EACH   Final   Culture  Setup Time     Final   Value: 12/13/2012 14:28     Performed at [facility]   Culture     Final   Value: STREPTOCOCCUS GROUP D;high probability for S.bovis     Note: SUSCEPTIBILITIES PERFORMED ON PREVIOUS CULTURE WITHIN THE LAST 5 DAYS.     Note: Gram Stain Report Called to,Read Back By and Verified With: [name] RN on 12/14/12 at 03:15 by Christie Nottingham     Performed at [facility]   Report Status 12/15/2012 FINAL   Final    Studies/Results: No results found.   Assessment/Plan: Streptococcal Bacteremia (Group D, Bovis)  AVR  ITP   Total days of antibiotics: 3 (Ceftriaxone)  TEE (-) 12-8  Repeat BCx pending Plan for 21 days total of rx. This is a longer course due to the presence of AVR.  Communicated to CM/ [facility].   Pt needs colonoscopy, Group D strep is highly associated with Colon Cancer (can be done as outpt). Will have him seen in ID clinic for f/u, repeat BCx at end of therapy. [name] Infectious Diseases (pager) [phone] [email or web address] 12/16/2012, 11:48 AM  LOS: 4 days

## 2012-12-16 NOTE — Care Management Note (Signed)
    Page 1 of 2   12/17/2012     2:07:52 PM   CARE MANAGEMENT NOTE 12/17/2012  Patient:  [name], [name]   Account Number:  [id number]  Date Initiated:  12/16/2012  Documentation initiated by:  Letha Cape  Subjective/Objective Assessment:   dx lethargy,  admit- lives with spouse. pta indep.     Action/Plan:   pt eval- rec hhpt/ot and rolling walker  12/10- midline insertion   Anticipated DC Date:  12/17/2012   Anticipated DC Plan:  HOME W HOME HEALTH SERVICES      DC Planning Services  CM consult      [facility] Choice  HOME HEALTH   Choice offered to / List presented to:  C-3 Spouse        HH arranged  HH-1 RN  HH-2 PT  HH-3 OT      [facility] agency  [facility]   Status of service:  Completed, signed off Medicare Important Message given?   (If response is "NO", the following Medicare IM given date fields will be blank) Date Medicare IM given:   Date Additional Medicare IM given:    Discharge Disposition:  HOME W HOME HEALTH SERVICES  Per UR Regulation:  Reviewed for med. necessity/level of care/duration of stay  If discussed at Long Length of Stay Meetings, dates discussed:    Comments:  12/17/12 14:07 Letha Cape RN, BSN [phone] patient for dc to home today, [facility] notified.  12/16/12 12:01 Letha Cape RN, BSN [phone] patient lives with spouse, per phsycal therapy recs hhpt/ot, will also need hhrn for iv abx and rolling walker. Spouse chose [facility] from agency list.  Referral made to [facility], Lupita Leash notified.  Soc will begin 24-48hrs post discharge. Family wants to know what co pay is for rolling walker, NCM sent in benefits check.  Per benefits check , patient will need to pay 20% for co-insurance, informed spouse, and she states that they will use the rolling walker they have at home.

## 2012-12-16 NOTE — Evaluation (Signed)
Physical Therapy Evaluation Patient Details Name: [name] MRN: [id number] DOB: 1922-05-01 Today's Date: 12/16/2012 Time: 4098-1191 PT Time Calculation (min): 40 min  PT Assessment / Plan / Recommendation History of Present Illness   79 y.o. male admitted with fatigue and lethargy for x 4 weeks. PT with hx of aortic wall replacement, atrial fibrillation, CVA, hypertension, diabetes mellitus, throat cancer, hypothyroidism, and splenomegaly.   Clinical Impression  Pt with deconditioning.  He appears that he does not feel well, but he is able to participate with PT.  Good potential to continue improvement with HHPT, but he needs a RW for safety.    PT Assessment  Patient needs continued PT services    Follow Up Recommendations  Home health PT    Does the patient have the potential to tolerate intense rehabilitation      Barriers to Discharge        Equipment Recommendations  Rolling walker with 5" wheels    Recommendations for Other Services     Frequency Min 3X/week    Precautions / Restrictions Precautions Precautions: Fall Restrictions Weight Bearing Restrictions: No   Pertinent Vitals/Pain No c/o pain      Mobility  Bed Mobility Bed Mobility: Supine to Sit;Sitting - Scoot to Edge of Bed;Sit to Supine Supine to Sit: With rails;6: Modified independent (Device/Increase time);HOB flat Sitting - Scoot to Edge of Bed: 4: Min guard;6: Modified independent (Device/Increase time) Details for Bed Mobility Assistance: needed extended time Transfers Transfers: Sit to Stand;Stand to Sit Sit to Stand: With upper extremity assist;From bed;5: Supervision;From toilet Stand to Sit: With upper extremity assist;To bed;5: Supervision Details for Transfer Assistance: pt uses arms to push up Ambulation/Gait Ambulation/Gait Assistance: 4: Min assist Ambulation Distance (Feet): 75 Feet Assistive device: Rolling walker Ambulation/Gait Assistance Details: pt instructed in use of RW  and able to use it for balance and support Gait Pattern: Step-through pattern;Trunk flexed;Decreased step length - left;Decreased stance time - right Gait velocity: decreased .. pt appears weak General Gait Details: pt needs RW for safety and confidence at this point Stairs: Yes Stairs Assistance: Other (comment) (verbally reviewed with pt and family,  deferred practiciing) Stair Management Technique: One rail Right;Sideways (recommended to family placing chairs to rest as needed)    Exercises General Exercises - Lower Extremity Ankle Circles/Pumps: AROM;5 reps;Both;Seated Long Arc Quad: AROM;Both;5 reps;Seated Toe Raises: AROM;Both;5 reps;Seated Heel Raises: AROM;Both;5 reps;Seated Other Exercises Other Exercises: repeated sit to stand x 5 reps   PT Diagnosis: Difficulty walking;Abnormality of gait;Generalized weakness  PT Problem List: Decreased strength;Decreased activity tolerance;Decreased balance;Decreased mobility;Decreased knowledge of use of DME;Decreased safety awareness PT Treatment Interventions: DME instruction;Gait training;Stair training;Functional mobility training;Therapeutic activities;Therapeutic exercise;Balance training;Patient/family education     PT Goals(Current goals can be found in the care plan section) Acute Rehab PT Goals Patient Stated Goal: to go home PT Goal Formulation: With patient/family Time For Goal Achievement: 12/30/12 Potential to Achieve Goals: Good  Visit Information  Assistance Needed: +1 History of Present Illness:  79 y.o. male admitted with fatigue and lethargy for x 4 weeks. PT with hx of aortic wall replacement, atrial fibrillation, CVA, hypertension, diabetes mellitus, throat cancer, hypothyroidism, and splenomegaly.        Prior Functioning  Home Living Family/patient expects to be discharged to:: Private residence Living Arrangements: Spouse/significant other Available Help at Discharge: Family Type of Home: House Home Access:  Stairs to enter Secretary/administrator of Steps: 3 Entrance Stairs-Rails: Right Home Layout: One level Home Equipment: None Prior Function  Level of Independence: Independent Comments: drives Communication Communication: No difficulties Dominant Hand: Right    Cognition  Cognition Arousal/Alertness: Awake/alert Behavior During Therapy: WFL for tasks assessed/performed Overall Cognitive Status: Within Functional Limits for tasks assessed    Extremity/Trunk Assessment Lower Extremity Assessment Lower Extremity Assessment: Generalized weakness (hx of swelling in legs. hemosiderin staining, ususaly wears support hose) Cervical / Trunk Assessment Cervical / Trunk Assessment: Kyphotic   Balance Balance Balance Assessed: Yes Static Sitting Balance Static Sitting - Balance Support: No upper extremity supported;Feet supported Static Sitting - Level of Assistance: 7: Independent Static Standing Balance Static Standing - Balance Support: No upper extremity supported Static Standing - Level of Assistance: 5: Stand by assistance Static Standing - Comment/# of Minutes: pt unable to challeng balance without UE support  End of Session PT - End of Session Equipment Utilized During Treatment: Gait belt Activity Tolerance: Patient limited by fatigue Patient left: in chair;with family/visitor present Nurse Communication: Mobility status  GP    Rosey Bath K. Manson Passey, [place] [phone] 12/16/2012, 9:37 AM

## 2012-12-16 NOTE — Progress Notes (Signed)
*  PRELIMINARY RESULTS* Echocardiogram Echocardiogram Transesophageal has been performed.  Jeryl Columbia 12/16/2012, 8:32 AM

## 2012-12-16 NOTE — Progress Notes (Signed)
[facility] PROGRESS NOTE  YOVANY CLOCK WJX:[id number] DOB: [birth year] DOA: 12/12/2012 PCP: [name], MD  Interim Summary Mr. Holness is a pleasant 79 year old gentleman with a past medical history of chronic pancytopenia, undergoing multiple bone marrow biopsies in the outpatient setting, with last bone marrow biopsy performed on 11/20/2010 showing slightly hypercellular marrow for age, erythroid predominant trilineage hematopoiesis, spectrum of maturation in all 3 hematopoietic lineages, that was negative for dysplasia and negative for lymphoma. Patient also with a past medical history of aortic valve replacement with bioprosthesis performed in 2013 at [facility]. He presented with a three-week history of generalized weakness, fatigue, malaise and poor tolerance to physical exertion. He received doxycycline in the outpatient setting. Blood cultures obtained during this hospitalization grew Streptococcus for which there was high clinical suspicion for infectious endocarditis given his history of aortic valve replacement. Dr. [name] of infectious disease was consulted as well as cardiology. Patient undergoing a transesophageal echocardiogram on 12/15/2012 which that was negative for vegetations. Cultures growing Streptococcus group D., high probability for S. bovis. Infectious disease making recommendations for IV antibiotic therapy with ceftriaxone, planning for a total of 21 days. Since this organism is associated with colon cancer, infectious disease recommending followup with GI for colonoscopy which can be done as an outpatient. Plan for PICC line placement, anticipate discharge in the next 24-48 hours to home with home health services.                                      Assessment/Plan:  Bacteremia 2 of 2 blood cultures with Strep (high probability for Strep Bovis) ID consulted.  Pending negative repeat bld cultures (drawn 12/8).  Midline can be placed and  Patient can be d/c'd on Rocephin. End date for Rocephin 12/27/2012. Given patient's sister had colon cancer, Mr. Knotts is scheduled to see [facility] on 12/23 at 1:30. TEE completed 12/8 was negative for vegetation. He does have a severe plaque in the aortic arch.  Visual changes Seeing black spots - floaters? BP has remained moderately controlled. Sxs not typical of retinal artery occlusion. Will continue to monitor.   Chronic pancytopenia.  Stable inhouse at approximately 55,000. Patient undergoing multiple bone marrow biopsies which have been unrevealing. 2012 BM bx negative for dysplasia negative for lymphoma.  Dr [name] consulted.  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  He notes the patient's thrombocytopenia has responded to nplate therapy in the past.   Stage III chronic kidney disease.  Stable. Patient's baseline creatinine near 2.0.    History of aortic valve replacement procedure performed at [facility] on 01/23/2011. No vegetation on TEE Not on anticoagulation.  Aspirin 325 mg daily.   Hypertension.  Continue amlodipine 5 mg by mouth daily and hydralazine 50 mg by mouth 3 times a day.   Hypothyroidism.  Continue Synthroid 25 mcg by mouth daily. TSH WNL   Code Status: Full Code Family Communication: [name], daughters at bedside Disposition Plan: Home when medically appropriate.  Will need HHPT, OT, RN   Consultants:  MedOnc  Cardiology  ID  Antibiotics:  Rocephin  HPI/Subjective: Patient is an 79 year old man with a past medical history of age of fibrillation, aortic valve replacement, presented with a four-week history of generalized weakness, malaise, fatigue and poor tolerance to physical exertion. He has a history of pancytopenia undergoing bone marrow biopsies  in the past which have been unrevealing.  12/9 Patient complains of seeing black spots "like 50  gnats"x 2-3 days  Objective: Filed Vitals:   12/16/12 1053  BP: 169/57  Pulse: 69  Temp:   Resp:     Intake/Output Summary (Last 24 hours) at 12/16/12 1224 Last data filed at 12/16/12 1101  Gross per 24 hour  Intake    503 ml  Output      0 ml  Net    503 ml   Filed Weights   12/12/12 2335 12/15/12 0500 12/16/12 0521  Weight: 84.4 kg (186 lb 1.1 oz) 84.5 kg (186 lb 4.6 oz) 86.1 kg (189 lb 13.1 oz)    Exam:   General:  Awake, Appropriate, Chronically ill appearing, no acute distress, multiple family members at bedside.  Cardiovascular: 3/6 systolic ejection murmur, no rubs or gallops  Respiratory: Normal respiratory effort, no wheezing rhonchi rales  Abdomen: Soft nontender nontender positive bowel sound  Musculoskeletal: Patient having 1+ bilateral pedal edema, SCDs in place  Data Reviewed: Basic Metabolic Panel:  Recent Labs Lab 12/12/12 1730 12/13/12 0520 12/14/12 1300 12/15/12 0655 12/16/12 0613  NA 136 134* 130* 135 138  K 3.8 3.5 3.3* 3.8 3.9  CL 102 102 101 105 105  CO2 20 19 18* 20 20  GLUCOSE 120* 106* 173* 118* 134*  BUN 39* 37* 41* 39* 39*  CREATININE 1.97* 1.91* 1.86* 1.90* 1.90*  CALCIUM 8.8 8.2* 8.0* 8.5 8.4   Liver Function Tests:  Recent Labs Lab 12/12/12 1730 12/13/12 0520  AST 42* 29  ALT 31 22  ALKPHOS 98 80  BILITOT 0.7 0.6  PROT 7.2 5.7*  ALBUMIN 3.1* 2.6*    Recent Labs Lab 12/12/12 1730  LIPASE 27   CBC:  Recent Labs Lab 12/12/12 1730 12/13/12 0520 12/14/12 1300 12/15/12 0655 12/16/12 0613  WBC 4.3 1.9* 2.5* 2.8* 3.6*  NEUTROABS 3.5 1.4*  --   --   --   HGB 11.6* 9.0* 9.2* 9.7* 10.3*  HCT 34.4* 26.6* 27.6* 29.1* 31.0*  MCV 84.7 83.4 84.9 83.6 84.5  PLT 50* 40* 55* 48* 56*   BNP (last 3 results)  Recent Labs  02/14/12 1729 12/13/12 0520  PROBNP 404.0* 4856.0*   CBG:  Recent Labs Lab 12/12/12 2354 12/13/12 0838 12/14/12 0751 12/15/12 0817 12/16/12 0755  GLUCAP 108* 118* 110* 108* 137*     Recent Results (from the past 240 hour(s))  CULTURE, BLOOD (ROUTINE X 2)     Status: None   Collection Time    12/13/12 12:35 AM      Result Value Range Status   Specimen Description BLOOD RIGHT WRIST   Final   Special Requests BOTTLES DRAWN AEROBIC ONLY 10CC   Final   Culture  Setup Time     Final   Value: 12/13/2012 05:48     Performed at [facility]   Culture     Final   Value: STREPTOCOCCUS GROUP D;high probability for S.bovis     Note: Gram Stain Report Called to,Read Back By and Verified With: Foye Spurling RN 12/13/2012 1915PM     Performed at [facility]   Report Status 12/15/2012 FINAL   Final   Organism ID, Bacteria STREPTOCOCCUS GROUP D;high probability for S.bovis   Final  CULTURE, BLOOD (ROUTINE X 2)     Status: None   Collection Time    12/13/12  2:45 AM      Result Value Range Status  Specimen Description BLOOD RIGHT ARM   Final   Special Requests BOTTLES DRAWN AEROBIC AND ANAEROBIC 10CC EACH   Final   Culture  Setup Time     Final   Value: 12/13/2012 14:28     Performed at [facility]   Culture     Final   Value: STREPTOCOCCUS GROUP D;high probability for S.bovis     Note: SUSCEPTIBILITIES PERFORMED ON PREVIOUS CULTURE WITHIN THE LAST 5 DAYS.     Note: Gram Stain Report Called to,Read Back By and Verified With: [name] RN on 12/14/12 at 03:15 by Christie Nottingham     Performed at [facility]   Report Status 12/15/2012 FINAL   Final     Studies: No results found.  Scheduled Meds: . allopurinol  100 mg Oral Daily  . amiodarone  100 mg Oral Daily  . amLODipine  5 mg Oral Daily  . aspirin  325 mg Oral Daily  . cefTRIAXone (ROCEPHIN)  IV  2 g Intravenous Q24H  . feeding supplement (RESOURCE BREEZE)  1 Container Oral TID BM  . furosemide  80 mg Oral Daily  . hydrALAZINE  50 mg Oral TID  . levothyroxine  25 mcg Oral QAC breakfast  . metoprolol tartrate  12.5 mg Oral Daily  . pantoprazole  40 mg Oral Daily  . potassium  chloride  10 mEq Oral Daily  . sodium chloride  3 mL Intravenous Q12H   Continuous Infusions:    Principal Problem:   Lethargy Active Problems:   THROAT CANCER   DIABETES MELLITUS, CONTROLLED   HYPERTENSION   Weakness   S/P AVR   Protein-calorie malnutrition, severe    Time spent: 35 minutes    Conley Canal  [facility] Pager [phone]. If 7PM-7AM, please contact night-coverage at [email or web address], password [facility] 12/16/2012, 12:24 PM  LOS: 4 days   Addendum I personally examined the patient on 12/16/2012 and agree with the above findings. Plan was formulated with Algis Downs PA.

## 2012-12-17 ENCOUNTER — Encounter (HOSPITAL_COMMUNITY): Payer: Self-pay | Admitting: Cardiology

## 2012-12-17 ENCOUNTER — Ambulatory Visit: Payer: Medicare Other | Admitting: Lab

## 2012-12-17 ENCOUNTER — Ambulatory Visit: Payer: Medicare Other | Admitting: Nurse Practitioner

## 2012-12-17 ENCOUNTER — Other Ambulatory Visit: Payer: Medicare Other

## 2012-12-17 MED ORDER — DEXTROSE 5 % IV SOLN: 2.0000 g | INTRAVENOUS | Status: DC

## 2012-12-17 MED ORDER — SODIUM CHLORIDE 0.9 % IJ SOLN: 10.0000 mL | INTRAMUSCULAR | Status: DC | PRN

## 2012-12-17 MED ORDER — DIPHENHYDRAMINE-ZINC ACETATE 2-0.1 % EX CREA
TOPICAL_CREAM | Freq: Two times a day (BID) | CUTANEOUS | Status: DC | PRN
  Filled 2012-12-17: qty 28

## 2012-12-17 MED ORDER — DIPHENHYDRAMINE HCL 25 MG PO CAPS
25.0000 mg | ORAL_CAPSULE | ORAL | Status: DC | PRN
  Administered 2012-12-17: 25 mg via ORAL
  Filled 2012-12-17: qty 1

## 2012-12-17 NOTE — Progress Notes (Signed)
Physical Therapy Treatment Patient Details Name: [name] MRN: [id number] DOB: January 14, 1922 Today's Date: 12/17/2012 Time: 4098-1191 PT Time Calculation (min): 23 min  PT Assessment / Plan / Recommendation  History of Present Illness  79 y.o. male admitted with fatigue and lethargy for x 4 weeks. PT with hx of aortic wall replacement, atrial fibrillation, CVA, hypertension, diabetes mellitus, throat cancer, hypothyroidism, and splenomegaly.    PT Comments   Pt pleasant & agreeable to participate in therapy.  Cont's to appear weak & fatigues fairly quickly but he was able to ambulate ~120' with RW & complete stair training.     Follow Up Recommendations  Home health PT     Does the patient have the potential to tolerate intense rehabilitation     Barriers to Discharge        Equipment Recommendations  Rolling walker with 5" wheels    Recommendations for Other Services    Frequency Min 3X/week   Progress towards PT Goals Progress towards PT goals: Progressing toward goals  Plan Current plan remains appropriate    Precautions / Restrictions Precautions Precautions: Fall Restrictions Weight Bearing Restrictions: No       Mobility  Bed Mobility Bed Mobility: Supine to Sit;Sitting - Scoot to Edge of Bed;Sit to Supine Supine to Sit: 5: Supervision;HOB flat Sitting - Scoot to Edge of Bed: 5: Supervision Sit to Supine: 5: Supervision;HOB flat Details for Bed Mobility Assistance: Incr time but no physical (A) Transfers Transfers: Sit to Stand;Stand to Sit Sit to Stand: 4: Min guard;With upper extremity assist;From bed Stand to Sit: 4: Min guard;With upper extremity assist;To bed Details for Transfer Assistance: cues to reinforce safest hand placement Ambulation/Gait Ambulation/Gait Assistance: 4: Min guard Ambulation Distance (Feet): 120 Feet Assistive device: Rolling walker Ambulation/Gait Assistance Details: Guarding for safety.  Slow but steady gait.   Gait Pattern:  Step-through pattern;Decreased stride length Gait velocity: decreased .. pt appears weak General Gait Details: pt needs RW for safety and confidence at this point Stairs: Yes Stairs Assistance: 4: Min guard Stair Management Technique: Forwards;One rail Left;Step to pattern Number of Stairs: 4 Wheelchair Mobility Wheelchair Mobility: No    Exercises General Exercises - Lower Extremity Ankle Circles/Pumps: AROM;Both;10 reps Long Arc Quad: AROM;Strengthening;Both;10 reps Hip ABduction/ADduction: AROM;Strengthening;Both;10 reps Straight Leg Raises: AROM;Strengthening;Both;10 reps Hip Flexion/Marching: AROM;Strengthening;Both;10 reps;Seated    PT Goals (current goals can now be found in the care plan section) Acute Rehab PT Goals PT Goal Formulation: With patient/family Time For Goal Achievement: 12/30/12 Potential to Achieve Goals: Good  Visit Information  Last PT Received On: 12/17/12 Assistance Needed: +1 History of Present Illness:  79 y.o. male admitted with fatigue and lethargy for x 4 weeks. PT with hx of aortic wall replacement, atrial fibrillation, CVA, hypertension, diabetes mellitus, throat cancer, hypothyroidism, and splenomegaly.     Subjective Data      Cognition  Cognition Arousal/Alertness: Awake/alert Behavior During Therapy: WFL for tasks assessed/performed Overall Cognitive Status: Within Functional Limits for tasks assessed    Balance     End of Session PT - End of Session Equipment Utilized During Treatment: Gait belt Activity Tolerance: Patient tolerated treatment well Patient left: in bed;with call bell/phone within reach;with family/visitor present Nurse Communication: Mobility status   GP     Lara Mulch 12/17/2012, 12:54 PM  Verdell Face, PTA [phone] 12/17/2012

## 2012-12-17 NOTE — Progress Notes (Signed)
Pt BP 107/51. NP on call paged. Awaiting orders.

## 2012-12-17 NOTE — Progress Notes (Signed)
Peripherally Inserted Central Catheter/Midline Placement  The IV Nurse has discussed with the patient and/or persons authorized to consent for the patient, the purpose of this procedure and the potential benefits and risks involved with this procedure.  The benefits include less needle sticks, lab draws from the catheter and patient may be discharged home with the catheter.  Risks include, but not limited to, infection, bleeding, blood clot (thrombus formation), and puncture of an artery; nerve damage and irregular heat beat.  Alternatives to this procedure were also discussed.  PICC/Midline Placement Documentation        [name] 12/17/2012, 5:00 PM

## 2012-12-17 NOTE — Progress Notes (Signed)
Nsg Discharge Note  Admit Date:  12/12/2012 Discharge date: 12/17/2012   [name] to be D/C'd Home per MD order.  AVS completed.  Copy for chart, and copy for patient signed, and dated. Patient/caregiver able to verbalize understanding.  Discharge Medication:   Medication List    STOP taking these medications       hydrALAZINE 50 MG tablet  Commonly known as:  APRESOLINE      TAKE these medications       allopurinol 100 MG tablet  Commonly known as:  ZYLOPRIM  Take 1 tablet (100 mg total) by mouth daily.     amiodarone 200 MG tablet  Commonly known as:  PACERONE  Take 0.5 tablets (100 mg total) by mouth daily.     amLODipine 5 MG tablet  Commonly known as:  NORVASC  Take 1 tablet (5 mg total) by mouth daily.     aspirin 325 MG tablet  Take 325 mg by mouth daily.     colchicine 0.6 MG tablet  Take 0.6 mg by mouth as needed. Take one tablet by mouth daily during an acute episode of gout.     dextrose 5 % SOLN 50 mL with cefTRIAXone 2 G SOLR 2 g  Inject 2 g into the vein daily.     fish oil-omega-3 fatty acids 1000 MG capsule  Take 1 g by mouth daily.     Flax Seed Oil 1000 MG Caps  Take 1 capsule by mouth daily.     furosemide 40 MG tablet  Commonly known as:  LASIX  Take 2 tablets (80 mg total) by mouth daily.     levothyroxine 25 MCG tablet  Commonly known as:  LEVOTHROID  Take 1 tablet (25 mcg total) by mouth daily.     LORazepam 0.5 MG tablet  Commonly known as:  ATIVAN  Take 0.5 mg by mouth every 6 (six) hours as needed. For nerves/tremors     metoprolol tartrate 25 MG tablet  Commonly known as:  LOPRESSOR  Take 0.5 tablets (12.5 mg total) by mouth daily.     multivitamin tablet  Take 1 tablet by mouth daily.     omeprazole 20 MG capsule  Commonly known as:  PRILOSEC  Take 1 capsule (20 mg total) by mouth daily.     potassium chloride 10 MEQ tablet  Commonly known as:  K-DUR  Take 1 tablet (10 mEq total) by mouth daily.     vitamin C  500 MG tablet  Commonly known as:  ASCORBIC ACID  Take 1 tablet (500 mg total) by mouth daily.     Vitamin D 1000 UNITS capsule  Take 1 capsule (1,000 Units total) by mouth daily.        Discharge Assessment: Filed Vitals:   12/17/12 1559  BP: 99/47  Pulse: 56  Temp: 97.9 F (36.6 C)  Resp: 18   Skin clean, dry and intact without evidence of skin break down, no evidence of skin tears noted. Skin discoloration to extremities noted and documented, otherwise skin intact.  IV catheter discontinued intact. Site without signs and symptoms of complications - no redness or edema noted at insertion site, patient denies c/o pain - only slight tenderness at site.  Dressing with slight pressure applied.  D/c Instructions-Education: Discharge instructions given to patient/family with verbalized understanding. D/c education completed with patient/family including follow up instructions, medication list, d/c activities limitations if indicated, with other d/c instructions as indicated by MD - patient able  to verbalize understanding, all questions fully answered. Patient instructed to return to ED, call 911, or call MD for any changes in condition.  Patient escorted via WC, and D/C home via private auto.  Kern Reap, RN 12/17/2012 6:51 PM

## 2012-12-17 NOTE — Progress Notes (Signed)
Spoke with NP on call. No new orders given. Will continue to monitor pt.

## 2012-12-17 NOTE — Discharge Summary (Signed)
Physician Discharge Summary  [name] ZOX:[id number] DOB: [birth year] DOA: 12/12/2012  PCP: [name], MD  Admit date: 12/12/2012 Discharge date: 12/17/2012  Time spent: 40 minutes  Recommendations for Outpatient Follow-up:  1. Followup with primary care physician within one week. 2. Followup with Dr. Yancey Flemings of [facility] gastroenterology.  Discharge Diagnoses:  Principal Problem:   Lethargy Active Problems:   THROAT CANCER   DIABETES MELLITUS, CONTROLLED   HYPERTENSION   Weakness   S/P AVR   Protein-calorie malnutrition, severe   Discharge Condition: Stable  Diet recommendation: Low sodium heart healthy diet  Filed Weights   12/15/12 0500 12/16/12 0521 12/17/12 0443  Weight: 84.5 kg (186 lb 4.6 oz) 86.1 kg (189 lb 13.1 oz) 86.3 kg (190 lb 4.1 oz)    History of present illness:  [name] is a 79 y.o. male with Past medical history of aortic wall replacement, atrial fibrillation, CVA, hypertension, diabetes mellitus, throat cancer, hypothyroidism, splenomegaly.  The patient is coming from home.  The patient presents with complaints of 3-4 weeks of not feeling well and fatigue.  He mentions that he has chronic pancytopenia and has multiple bone marrow biopsies and they have not been able to identify the cause. Since last 3 weeks he has been having fatigue with tiredness and dyspnea on exertion. He denies any fever or chills or chest pain or orthopnea, PND, leg swelling he denies any diarrhea or black color bowel movements . he also denies any nausea or vomiting.  He chief 2 weeks ago his PCP gave him some antibiotics but other than that there is no change in his medication and he is compliant with all his medication.  Since recently he was found that his blood count was in eights, he was referred to a hematologist we'll give him 2 units of blood transfusion yesterday.  He mentions that he has cough with clear expectoration without any blood.  He initially denied  any rash or bug bite, but later on examination he was Found to have upper body rash present he does not remember seeing them before today.  4 weeks ago he mentions that he has been able to chop wood for fire. At which time he also denies any bites from bug.  He complains that he has been seeing black spots flying around his eyes.  He also complains of some pain on the left upper quadrant that has been noted since last 2 weeks  Hospital Course:   1. Streptococcus bovis bacteremia: Patient admitted to the hospital with several weeks of fatigue and dyspnea on exertion. After admission to the hospital empiric antibiotics started. His blood culture shows 2 out of 2 Streptococcus species, lap says it has high probability for Streptococcus bovis. ID was consulted and recommended Rocephin for 2 weeks. Because of correlation between Streptococcus bacteremia: Cancer patient to see [place] gastro[facility]ology on 12/23 for possible colonoscopy. Please note that patient first degree relative had colon cancer. TEE was completed on 12/8 and was negative for vegetations.  2. Visual changes: Patient was seeing black spots, floaters. Patient mentioned these changes as of admission, but improving since admission actually. Patient recommended to followup with his ophthalmologist for further evaluation.  3. Chronic pancytopenia: Patient does have chronic pancytopenia, per Dr. [name] of the oncology service likely due to chronic lymphoproliferative disorder. Patient had bone marrow biopsy done before but it was unrevealing. This is stable..  4. Stage III chronic kidney disease: This is stable, baseline creatinine is about 2.0,  he is running close to his baseline. His Lasix continued throughout the hospital stay.  5. History of aortic valve replacement: Procedure performed at [facility] on 01/23/2011, patient evaluated by TEE to rule out vegetations. Per TEE report no medications were seen near the  prosthetic aortic valve, there is nonmobile atherosclerotic plaque in the ascending aorta.  6. Hypertension: Home medication continued throughout the hospital stay included amlodipine and hydralazine 50 mg.  Procedures:  TEE:, Dr. Delton See on 12/15/2012 showed no evidence of vegetation.  Consultations:  Cardiology.  Infectious disease.  Hematology oncology  Discharge Exam: Filed Vitals:   12/17/12 1038  BP: 106/59  Pulse: 73  Temp:   Resp:    General: Alert and awake, oriented x3, not in any acute distress. HEENT: anicteric sclera, pupils reactive to light and accommodation, EOMI CVS: S1-S2 clear, no murmur rubs or gallops Chest: clear to auscultation bilaterally, no wheezing, rales or rhonchi Abdomen: soft nontender, nondistended, normal bowel sounds, no organomegaly Extremities: no cyanosis, clubbing or edema noted bilaterally Neuro: Cranial nerves II-XII intact, no focal neurological deficits  Discharge Instructions  Discharge Orders   Future Appointments Provider Department Dept Phone   12/30/2012 1:30 PM Amy Sydell Axon [facility] [phone]   12/31/2012 9:00 AM Randall Hiss, MD [facility] [phone]   03/02/2013 3:15 PM [facility] [phone]   03/02/2013 3:45 PM Ladene Artist, MD [facility] MEDICAL ONCOLOGY [phone]   03/17/2013 9:15 AM Tonny Bollman, MD [facility] Office [phone]   Future Orders Complete By Expires   Diet - low sodium heart healthy  As directed    Increase activity slowly  As directed        Medication List         allopurinol 100 MG tablet  Commonly known as:  ZYLOPRIM  Take 1 tablet (100 mg total) by mouth daily.     amiodarone 200 MG tablet  Commonly known as:  PACERONE  Take 0.5 tablets (100 mg total) by mouth daily.     amLODipine 5 MG tablet  Commonly known as:   NORVASC  Take 1 tablet (5 mg total) by mouth daily.     aspirin 325 MG tablet  Take 325 mg by mouth daily.     colchicine 0.6 MG tablet  Take 0.6 mg by mouth as needed. Take one tablet by mouth daily during an acute episode of gout.     dextrose 5 % SOLN 50 mL with cefTRIAXone 2 G SOLR 2 g  Inject 2 g into the vein daily.     fish oil-omega-3 fatty acids 1000 MG capsule  Take 1 g by mouth daily.     Flax Seed Oil 1000 MG Caps  Take 1 capsule by mouth daily.     furosemide 40 MG tablet  Commonly known as:  LASIX  Take 2 tablets (80 mg total) by mouth daily.     hydrALAZINE 50 MG tablet  Commonly known as:  APRESOLINE  Take 1 tablet (50 mg total) by mouth 3 (three) times daily.     levothyroxine 25 MCG tablet  Commonly known as:  LEVOTHROID  Take 1 tablet (25 mcg total) by mouth daily.     LORazepam 0.5 MG tablet  Commonly known as:  ATIVAN  Take 0.5 mg by mouth every 6 (six) hours as needed. For nerves/tremors  metoprolol tartrate 25 MG tablet  Commonly known as:  LOPRESSOR  Take 0.5 tablets (12.5 mg total) by mouth daily.     multivitamin tablet  Take 1 tablet by mouth daily.     omeprazole 20 MG capsule  Commonly known as:  PRILOSEC  Take 1 capsule (20 mg total) by mouth daily.     potassium chloride 10 MEQ tablet  Commonly known as:  K-DUR  Take 1 tablet (10 mEq total) by mouth daily.     vitamin C 500 MG tablet  Commonly known as:  ASCORBIC ACID  Take 1 tablet (500 mg total) by mouth daily.     Vitamin D 1000 UNITS capsule  Take 1 capsule (1,000 Units total) by mouth daily.       Allergies  Allergen Reactions  . Penicillins Other (See Comments)    Does not remember reaction (~year 1950)  . Neomycin-Bacitracin Zn-Polymyx     ? Reaction (thinks he remembers redness)       Follow-up Information   Follow up with Mike Gip, PA-C On 12/30/2012. (1:30 pm - [facility])    Specialty:  Gastroenterology   Contact information:   [address] [place] [address] [phone]       Follow up with [name], MD In 1 week.   Specialty:  Internal Medicine   Contact information:   [phone] [address] [facility] [address] [place] [address] [phone]        The results of significant diagnostics from this hospitalization (including imaging, microbiology, ancillary and laboratory) are listed below for reference.    Significant Diagnostic Studies: Dg Chest 2 View  12/12/2012   CLINICAL DATA:  Weakness.  EXAM: CHEST  2 VIEW  COMPARISON:  12/08/2012.  FINDINGS: Mildly enlarged cardiac silhouette. Clear lungs. Stable tiny calcified granulomata on the left. Stable calcified hilar lymph nodes. The aorta remains tortuous. Diffuse osteopenia. Upper abdominal surgical clips on the right compatible with a previous cholecystectomy if.  IMPRESSION: 1. No acute abnormality. 2. Mild cardiomegaly. 3. Previous granulomatous infection.   Electronically Signed   By: Gordan Payment M.D.   On: 12/12/2012 19:25   Dg Chest 2 View  12/08/2012   CLINICAL DATA:  Cough, dyspnea, anemia.  EXAM: CHEST  2 VIEW  COMPARISON:  Multiple priors  FINDINGS: The cardiomediastinal silhouette is unchanged. Evidence of prior granulomatous disease. No focal infiltrate or edema. No pneumothorax. Minimal blunting of the left costophrenic angle. No acute osseous abnormality.  IMPRESSION: Minimal blunting of the left costophrenic angle, consistent with a tiny pleural effusion. No focal infiltrate.   Electronically Signed   By: Jerene Dilling M.D.   On: 12/08/2012 15:32   US Abdomen Complete  12/13/2012   CLINICAL DATA:  Left upper quadrant pain, history of splenomegaly  EXAM: ULTRASOUND ABDOMEN COMPLETE  COMPARISON:  CT abdomen/ pelvis 06/14/2007  FINDINGS: Gallbladder:  The gallbladder is surgically absent.  Common bile duct:  Diameter: Within normal limits at 4.6 mm  Liver:  No focal lesion identified. Within normal limits in parenchymal echogenicity. Trace  perihepatic ascites.  IVC:  No abnormality visualized.  Pancreas:  Visualized portion unremarkable.  Spleen:  Marked splenomegaly. The splenic volume is approximated at 3781 cc and the spleen measures approximately 25 cm in craniocaudal dimension.  Right Kidney:  Length: 11.8 cm. Diffuse cortical thinning. The remaining renal parenchyma appears echogenic. There are multiple sonographically simple cysts, the largest measuring up to 5.4 cm exophytic from the lower  pole.  Left Kidney:  Length: 13.9 cm. Diffuse cortical thinning. The remaining renal parenchyma appears echogenic. Sonographically simple renal cysts. The largest measures 6.5 cm exophytic from the lower pole.  Abdominal aorta:  No aneurysm visualized.  Partially obscured by overlying bowel gas.  Other findings:  None.  IMPRESSION: 1. The marked splenomegaly. The splenic volume is estimated at 3781 cc. Comparing across modalities to the prior CT of the abdomen and pelvis from June of 2009, the splenic size is similar to mildly enlarged. 2. Trace perihepatic ascites. 3. Bilateral renal cortical thinning with echogenic parenchyma suggesting underlying medical renal disease. 4. Bilateral large but sonographically simple renal cysts. On the left, an exophytic cyst from the lower pole measures up to 6.5 cm.   Electronically Signed   By: Malachy Moan M.D.   On: 12/13/2012 08:20    Microbiology: Recent Results (from the past 240 hour(s))  CULTURE, BLOOD (ROUTINE X 2)     Status: None   Collection Time    12/13/12 12:35 AM      Result Value Range Status   Specimen Description BLOOD RIGHT WRIST   Final   Special Requests BOTTLES DRAWN AEROBIC ONLY 10CC   Final   Culture  Setup Time     Final   Value: 12/13/2012 05:48     Performed at [facility]   Culture     Final   Value: STREPTOCOCCUS GROUP D;high probability for S.bovis     Note: Gram Stain Report Called to,Read Back By and Verified With: Foye Spurling RN 12/13/2012 1915PM      Performed at [facility]   Report Status 12/15/2012 FINAL   Final   Organism ID, Bacteria STREPTOCOCCUS GROUP D;high probability for S.bovis   Final  CULTURE, BLOOD (ROUTINE X 2)     Status: None   Collection Time    12/13/12  2:45 AM      Result Value Range Status   Specimen Description BLOOD RIGHT ARM   Final   Special Requests BOTTLES DRAWN AEROBIC AND ANAEROBIC 10CC EACH   Final   Culture  Setup Time     Final   Value: 12/13/2012 14:28     Performed at [facility]   Culture     Final   Value: STREPTOCOCCUS GROUP D;high probability for S.bovis     Note: SUSCEPTIBILITIES PERFORMED ON PREVIOUS CULTURE WITHIN THE LAST 5 DAYS.     Note: Gram Stain Report Called to,Read Back By and Verified With: [name] RN on 12/14/12 at 03:15 by Christie Nottingham     Performed at [facility]   Report Status 12/15/2012 FINAL   Final  CULTURE, BLOOD (ROUTINE X 2)     Status: None   Collection Time    12/15/12  7:13 PM      Result Value Range Status   Specimen Description BLOOD LEFT ARM   Final   Special Requests BOTTLES DRAWN AEROBIC AND ANAEROBIC 10CC   Final   Culture  Setup Time     Final   Value: 12/16/2012 01:02     Performed at [facility]   Culture     Final   Value:        BLOOD CULTURE RECEIVED NO GROWTH TO DATE CULTURE WILL BE HELD FOR 5 DAYS BEFORE ISSUING A FINAL NEGATIVE REPORT     Performed at [facility]   Report Status PENDING   Incomplete  CULTURE, BLOOD (ROUTINE X 2)  Status: None   Collection Time    12/15/12  7:24 PM      Result Value Range Status   Specimen Description BLOOD LEFT FOREARM   Final   Special Requests BOTTLES DRAWN AEROBIC AND ANAEROBIC 10CC   Final   Culture  Setup Time     Final   Value: 12/16/2012 01:04     Performed at [facility]   Culture     Final   Value:        BLOOD CULTURE RECEIVED NO GROWTH TO DATE CULTURE WILL BE HELD FOR 5 DAYS BEFORE ISSUING A FINAL NEGATIVE REPORT     Performed at [facility]   Report Status PENDING   Incomplete     Labs: Basic Metabolic Panel:  Recent Labs Lab 12/12/12 1730 12/13/12 0520 12/14/12 1300 12/15/12 0655 12/16/12 0613  NA 136 134* 130* 135 138  K 3.8 3.5 3.3* 3.8 3.9  CL 102 102 101 105 105  CO2 20 19 18* 20 20  GLUCOSE 120* 106* 173* 118* 134*  BUN 39* 37* 41* 39* 39*  CREATININE 1.97* 1.91* 1.86* 1.90* 1.90*  CALCIUM 8.8 8.2* 8.0* 8.5 8.4   Liver Function Tests:  Recent Labs Lab 12/12/12 1730 12/13/12 0520  AST 42* 29  ALT 31 22  ALKPHOS 98 80  BILITOT 0.7 0.6  PROT 7.2 5.7*  ALBUMIN 3.1* 2.6*    Recent Labs Lab 12/12/12 1730  LIPASE 27   No results found for this basename: AMMONIA,  in the last 168 hours CBC:  Recent Labs Lab 12/12/12 1730 12/13/12 0520 12/14/12 1300 12/15/12 0655 12/16/12 0613  WBC 4.3 1.9* 2.5* 2.8* 3.6*  NEUTROABS 3.5 1.4*  --   --   --   HGB 11.6* 9.0* 9.2* 9.7* 10.3*  HCT 34.4* 26.6* 27.6* 29.1* 31.0*  MCV 84.7 83.4 84.9 83.6 84.5  PLT 50* 40* 55* 48* 56*   Cardiac Enzymes: No results found for this basename: CKTOTAL, CKMB, CKMBINDEX, TROPONINI,  in the last 168 hours BNP: BNP (last 3 results)  Recent Labs  02/14/12 1729 12/13/12 0520  PROBNP 404.0* 4856.0*   CBG:  Recent Labs Lab 12/13/12 0838 12/14/12 0751 12/15/12 0817 12/16/12 0755 12/17/12 0752  GLUCAP 118* 110* 108* 137* 121*       Signed:  Khalon Cansler A  [facility] 12/17/2012, 1:20 PM

## 2012-12-18 LAB — HIV-1 RNA QUANT-NO REFLEX-BLD
HIV 1 RNA Quant: <20 copies/mL (ref <20)
HIV-1 RNA Quant, Log: <1.3 {Log} (ref <1.30)

## 2012-12-19 ENCOUNTER — Encounter (HOSPITAL_COMMUNITY): Payer: Self-pay | Admitting: Emergency Medicine

## 2012-12-19 ENCOUNTER — Emergency Department (HOSPITAL_COMMUNITY)
Admission: EM | Admit: 2012-12-19 | Discharge: 2012-12-20 | Disposition: A | Discharge status: Home / Self Care | Payer: Medicare Other | Attending: Emergency Medicine | Admitting: Emergency Medicine

## 2012-12-19 DIAGNOSIS — Z8739 Personal history of other diseases of the musculoskeletal system and connective tissue: Secondary | ICD-10-CM | POA: Insufficient documentation

## 2012-12-19 DIAGNOSIS — N189 Chronic kidney disease, unspecified: Secondary | ICD-10-CM | POA: Insufficient documentation

## 2012-12-19 DIAGNOSIS — T82838A Hemorrhage of vascular prosthetic devices, implants and grafts, initial encounter: Secondary | ICD-10-CM

## 2012-12-19 DIAGNOSIS — Z860100 Personal history of colon polyps, unspecified: Secondary | ICD-10-CM | POA: Insufficient documentation

## 2012-12-19 DIAGNOSIS — K219 Gastro-esophageal reflux disease without esophagitis: Secondary | ICD-10-CM | POA: Insufficient documentation

## 2012-12-19 DIAGNOSIS — E039 Hypothyroidism, unspecified: Secondary | ICD-10-CM | POA: Insufficient documentation

## 2012-12-19 DIAGNOSIS — E119 Type 2 diabetes mellitus without complications: Secondary | ICD-10-CM | POA: Insufficient documentation

## 2012-12-19 DIAGNOSIS — R011 Cardiac murmur, unspecified: Secondary | ICD-10-CM | POA: Insufficient documentation

## 2012-12-19 DIAGNOSIS — I4891 Unspecified atrial fibrillation: Secondary | ICD-10-CM | POA: Insufficient documentation

## 2012-12-19 DIAGNOSIS — Z7982 Long term (current) use of aspirin: Secondary | ICD-10-CM | POA: Insufficient documentation

## 2012-12-19 DIAGNOSIS — I509 Heart failure, unspecified: Secondary | ICD-10-CM | POA: Insufficient documentation

## 2012-12-19 DIAGNOSIS — Z954 Presence of other heart-valve replacement: Secondary | ICD-10-CM | POA: Insufficient documentation

## 2012-12-19 DIAGNOSIS — Z862 Personal history of diseases of the blood and blood-forming organs and certain disorders involving the immune mechanism: Secondary | ICD-10-CM | POA: Insufficient documentation

## 2012-12-19 DIAGNOSIS — Z79899 Other long term (current) drug therapy: Secondary | ICD-10-CM | POA: Insufficient documentation

## 2012-12-19 DIAGNOSIS — I129 Hypertensive chronic kidney disease with stage 1 through stage 4 chronic kidney disease, or unspecified chronic kidney disease: Secondary | ICD-10-CM | POA: Insufficient documentation

## 2012-12-19 DIAGNOSIS — Z880 Allergy status to penicillin: Secondary | ICD-10-CM | POA: Insufficient documentation

## 2012-12-19 DIAGNOSIS — M109 Gout, unspecified: Secondary | ICD-10-CM | POA: Insufficient documentation

## 2012-12-19 DIAGNOSIS — IMO0002 Reserved for concepts with insufficient information to code with codable children: Secondary | ICD-10-CM | POA: Insufficient documentation

## 2012-12-19 DIAGNOSIS — Y840 Cardiac catheterization as the cause of abnormal reaction of the patient, or of later complication, without mention of misadventure at the time of the procedure: Secondary | ICD-10-CM | POA: Insufficient documentation

## 2012-12-19 DIAGNOSIS — Z8673 Personal history of transient ischemic attack (TIA), and cerebral infarction without residual deficits: Secondary | ICD-10-CM | POA: Insufficient documentation

## 2012-12-19 DIAGNOSIS — Z872 Personal history of diseases of the skin and subcutaneous tissue: Secondary | ICD-10-CM | POA: Insufficient documentation

## 2012-12-19 DIAGNOSIS — Z8659 Personal history of other mental and behavioral disorders: Secondary | ICD-10-CM | POA: Insufficient documentation

## 2012-12-19 DIAGNOSIS — Z8669 Personal history of other diseases of the nervous system and sense organs: Secondary | ICD-10-CM | POA: Insufficient documentation

## 2012-12-19 DIAGNOSIS — Z8521 Personal history of malignant neoplasm of larynx: Secondary | ICD-10-CM | POA: Insufficient documentation

## 2012-12-19 DIAGNOSIS — Z87891 Personal history of nicotine dependence: Secondary | ICD-10-CM | POA: Insufficient documentation

## 2012-12-19 NOTE — ED Notes (Signed)
Pt upset that he has not been taken back to a room yet

## 2012-12-19 NOTE — ED Notes (Signed)
Pt. reports bleeding his left arm PICC line onset this morning .

## 2012-12-20 NOTE — ED Notes (Signed)
IV team at bedside, PICC line leaking at site, no edema noted.

## 2012-12-20 NOTE — ED Provider Notes (Signed)
CSN: [id number]     Arrival date & time 12/19/12  2030 History   First MD Initiated Contact with Patient 12/19/12 2351     Chief Complaint  Patient presents with  . Vascular Access Problem   (Consider location/radiation/quality/duration/timing/severity/associated sxs/prior Treatment) HPI Hx per PT - recent admit and discharged home yesterday with PICC line to receive ABx. Yesterday had some bleeding from PICC insertion site and home health RN placed a tegaderm bandage. PT went to bed and woke up with blood on his shirt.  He presents here with some persistent bleeding from the PICC site. He is prescribed ASA, no other blood thinners.  No trauma. No weakness, no SOB. No bleeding from the line. Bleeding mild to mod in severity. Past Medical History  Diagnosis Date  . Stroke   . Atrial fibrillation     amiodarone therapy; not a candidate for anticoagulation  . Bradycardia   . Aortic stenosis     echo 10/12: EF 65-70%, grade 2 diast dysfxn, severe AS, mean gradient 60 mmHg, mod LAE, PASP 47;  s/p minimally invasive tissue AVR with Dr. Silvestre Mesi at [facility] 01/2011 (pre-AVR cath with no obs CAD)  . Hypertension   . Diabetes mellitus   . Anemia   . Action tremor   . Thrombocytopenia     Dr. [name]  . Throat cancer     s/p resection  . Colon polyp   . Anxiety   . Splenomegaly   . Gout   . GERD (gastroesophageal reflux disease)   . Hypothyroidism   . Leukopenia     Chronic pancytopenia  . Degenerative disc disease   . Joint effusion, knee     left knee  . Synovial cyst of popliteal space   . Cellulitis of left leg 10/11-16/2012  . CKD (chronic kidney disease)   . CHF (congestive heart failure)   . Heart murmur    Past Surgical History  Procedure Laterality Date  . Cholecystectomy    . Joint effusion      left knee  . Aortic valve replacement  01/23/2011    via minimally invasive approach per Dr Silvestre Mesi, [facility]  . Tee without cardioversion N/A 12/15/2012    Procedure: TRANSESOPHAGEAL  ECHOCARDIOGRAM (TEE);  Surgeon: Lars Masson, MD;  Location: [facility] ENDOSCOPY;  Service: Cardiovascular;  Laterality: N/A;   Family History  Problem Relation Age of Onset  . Colon cancer    . Stroke    . Cancer Other     colon  . Stroke Other    History  Substance Use Topics  . Smoking status: Former Smoker    Quit date: 01/11/1962  . Smokeless tobacco: Not on file  . Alcohol Use: No    Review of Systems  Constitutional: Negative for fever and chills.  Respiratory: Negative for shortness of breath.   Cardiovascular: Negative for chest pain.  Gastrointestinal: Negative for abdominal pain.  Genitourinary: Negative for dysuria.  Musculoskeletal: Negative for neck pain.  Skin: Negative for rash.  Neurological: Negative for headaches.  All other systems reviewed and are negative.    Allergies  Penicillins and Neomycin-bacitracin zn-polymyx  Home Medications   Current Outpatient Rx  Name  Route  Sig  Dispense  Refill  . allopurinol (ZYLOPRIM) 100 MG tablet   Oral   Take 1 tablet (100 mg total) by mouth daily.   90 tablet   3   . amiodarone (PACERONE) 200 MG tablet   Oral   Take 0.5 tablets (100  mg total) by mouth daily.   1 tablet   0   . amLODipine (NORVASC) 5 MG tablet   Oral   Take 1 tablet (5 mg total) by mouth daily.   90 tablet   3   . aspirin 325 MG tablet   Oral   Take 325 mg by mouth daily.           . Cholecalciferol (VITAMIN D) 1000 UNITS capsule   Oral   Take 1 capsule (1,000 Units total) by mouth daily.   90 capsule   3   . colchicine 0.6 MG tablet   Oral   Take 0.6 mg by mouth as needed. Take one tablet by mouth daily during an acute episode of gout.         [name] Kitchen dextrose 5 % SOLN 50 mL with cefTRIAXone 2 G SOLR 2 g   Intravenous   Inject 2 g into the vein daily.   2 g   0   . fish oil-omega-3 fatty acids 1000 MG capsule   Oral   Take 1 g by mouth daily.           . Flaxseed, Linseed, (FLAX SEED OIL) 1000 MG CAPS   Oral    Take 1 capsule by mouth daily.          . furosemide (LASIX) 40 MG tablet   Oral   Take 2 tablets (80 mg total) by mouth daily.   180 tablet   3   . levothyroxine (LEVOTHROID) 25 MCG tablet   Oral   Take 1 tablet (25 mcg total) by mouth daily.   90 tablet   3   . metoprolol tartrate (LOPRESSOR) 25 MG tablet   Oral   Take 0.5 tablets (12.5 mg total) by mouth daily.   90 tablet   3   . Multiple Vitamin (MULTIVITAMIN) tablet   Oral   Take 1 tablet by mouth daily.           [name] Kitchen omeprazole (PRILOSEC) 20 MG capsule   Oral   Take 1 capsule (20 mg total) by mouth daily.   90 capsule   1   . potassium chloride (K-DUR) 10 MEQ tablet   Oral   Take 1 tablet (10 mEq total) by mouth daily.   90 tablet   3   . vitamin C (ASCORBIC ACID) 500 MG tablet   Oral   Take 1 tablet (500 mg total) by mouth daily.   90 tablet   3    BP 133/53  Pulse 62  Temp(Src) 97.5 F (36.4 C) (Oral)  Resp 18  SpO2 98% Physical Exam  Constitutional: He is oriented to person, place, and time. He appears well-developed and well-nourished.  HENT:  Head: Normocephalic and atraumatic.  Eyes: EOM are normal. Pupils are equal, round, and reactive to light.  Neck: Neck supple.  Cardiovascular: Regular rhythm and intact distal pulses.   Pulmonary/Chest: Effort normal. No respiratory distress. He exhibits no tenderness.  Abdominal: Soft. He exhibits no distension. There is no tenderness.  Musculoskeletal: Normal range of motion.  RUE: PICC in place with clear dressing and some blood that appears to be from the insertion site. No active bleeding visualized. No areas of hematoma or tenderness.  Distal N/V intact. No proximal tenderness or edema.   Neurological: He is alert and oriented to person, place, and time.  Skin: Skin is warm and dry.    ED Course  Procedures (including critical care time)  Labs Review Labs Reviewed - No data to display Imaging Review No results found.  12:48 AM IV team  evaluated PICC, replaced dressing with a pressure dressing, line flushes no issues.  No active bleeding. Distal n/v intact  1:15 AM after period of observation, no return of bleeding and PT requesting to be discharged home, plan f/u as scheduled and continue ABx as prescribed.   MDM  DX: bleeding from PICC line insertion site - resolved  No indication for labs or imaging VS and nurses notes reviewed   Sunnie Nielsen, MD 12/20/12 [phone]

## 2012-12-22 ENCOUNTER — Telehealth: Payer: Self-pay | Admitting: *Deleted

## 2012-12-22 LAB — CULTURE, BLOOD (ROUTINE X 2): Culture: NO GROWTH

## 2012-12-22 NOTE — Telephone Encounter (Signed)
Error

## 2012-12-30 ENCOUNTER — Ambulatory Visit (INDEPENDENT_AMBULATORY_CARE_PROVIDER_SITE_OTHER): Payer: Medicare Other | Admitting: Physician Assistant

## 2012-12-30 ENCOUNTER — Encounter: Payer: Self-pay | Admitting: Physician Assistant

## 2012-12-30 VITALS — BP 100/58 | HR 60 | Ht 70.0 in | Wt 177.8 lb

## 2012-12-30 DIAGNOSIS — R7881 Bacteremia: Secondary | ICD-10-CM

## 2012-12-30 DIAGNOSIS — Z86010 Personal history of colonic polyps: Secondary | ICD-10-CM

## 2012-12-30 DIAGNOSIS — Z1211 Encounter for screening for malignant neoplasm of colon: Secondary | ICD-10-CM

## 2012-12-30 NOTE — Progress Notes (Signed)
Subjective:    Patient ID: [name], male    DOB: July 15, 1922, 79 y.o.   MRN: [id number]  HPI  [name] is a very nice 32 H. or old white male with multiple medical problems. He does have history of laryngeal cancer, diabetes mellitus, hypertension, history of atrial fibrillation, prior CVA, and is status post remote aortic valve replacement. He is maintained on a regular aspirin once daily no other blood thinners. He also has history of a chronic thrombocytopenia I believe secondary to ITP Patient does have history of adenomatous polyps and last had colonoscopy with Dr. Wendie Walton in 2002 with 2 small adenomatous polyps removed one 3 mm and one 7 mm. He had colonoscopy in 2001 with 1 tubovillous adenoma. . Patient was recently hospitalized December 5 through December 10 and was found to have a strapped D. bacteremia. No clear etiology was found and infectious disease has referred him for GI evaluation because strap D. can be associated with colon cancer. Patient had undergone TEE which did not show any evidence of vegetation in his heart. He is currently on a six-week course of Rocephin and has followup with infectious disease tomorrow. He has no current GI complaints other than generalized decrease in appetite and loss of taste since being on antibiotics. He has no complaints of abdominal pain changes in his bowel habits melena or hematochezia. He says he is very tired and fatigued and just does not feel well. Family history is pertinent for colon cancer in patient's sister    Review of Systems  Constitutional: Positive for activity change, appetite change, fatigue and unexpected weight change.  HENT: Negative.   Eyes: Negative.   Respiratory: Negative.   Cardiovascular: Negative.   Gastrointestinal: Negative.   Endocrine: Negative.   Genitourinary: Negative.   Musculoskeletal: Negative.   Skin: Negative.   Allergic/Immunologic: Negative.   Neurological: Positive for weakness.  Hematological:  Bruises/bleeds easily.   Outpatient Prescriptions Prior to Visit  Medication Sig Dispense Refill  . allopurinol (ZYLOPRIM) 100 MG tablet Take 1 tablet (100 mg total) by mouth daily.  90 tablet  3  . amiodarone (PACERONE) 200 MG tablet Take 0.5 tablets (100 mg total) by mouth daily.  1 tablet  0  . amLODipine (NORVASC) 5 MG tablet Take 1 tablet (5 mg total) by mouth daily.  90 tablet  3  . aspirin 325 MG tablet Take 325 mg by mouth daily.        . Cholecalciferol (VITAMIN D) 1000 UNITS capsule Take 1 capsule (1,000 Units total) by mouth daily.  90 capsule  3  . colchicine 0.6 MG tablet Take 0.6 mg by mouth as needed. Take one tablet by mouth daily during an acute episode of gout.      [name] Kitchen dextrose 5 % SOLN 50 mL with cefTRIAXone 2 G SOLR 2 g Inject 2 g into the vein daily.  2 g  0  . fish oil-omega-3 fatty acids 1000 MG capsule Take 1 g by mouth daily.        . Flaxseed, Linseed, (FLAX SEED OIL) 1000 MG CAPS Take 1 capsule by mouth daily.       . furosemide (LASIX) 40 MG tablet Take 2 tablets (80 mg total) by mouth daily.  180 tablet  3  . levothyroxine (LEVOTHROID) 25 MCG tablet Take 1 tablet (25 mcg total) by mouth daily.  90 tablet  3  . metoprolol tartrate (LOPRESSOR) 25 MG tablet Take 0.5 tablets (12.5 mg total) by mouth  daily.  90 tablet  3  . Multiple Vitamin (MULTIVITAMIN) tablet Take 1 tablet by mouth daily.        [name] Kitchen omeprazole (PRILOSEC) 20 MG capsule Take 1 capsule (20 mg total) by mouth daily.  90 capsule  1  . potassium chloride (K-DUR) 10 MEQ tablet Take 1 tablet (10 mEq total) by mouth daily.  90 tablet  3  . vitamin C (ASCORBIC ACID) 500 MG tablet Take 1 tablet (500 mg total) by mouth daily.  90 tablet  3   No facility-administered medications prior to visit.     Allergies  Allergen Reactions  . Penicillins Other (See Comments)    Does not remember reaction (~year 1950)  . Neomycin-Bacitracin Zn-Polymyx     ? Reaction (thinks he remembers redness)   Patient Active Problem  List   Diagnosis Date Noted  . Bacteremia 12/30/2012  . S/P AVR 12/13/2012  . Protein-calorie malnutrition, severe 12/13/2012  . Weakness 12/12/2012  . Lethargy 12/12/2012  . Cellulitis 12/29/2011  . Sinus bradycardia 10/04/2011  . Renal insufficiency 09/18/2011  . Tremor 04/18/2011  . Hypothyroidism 02/27/2011  . Restless legs 02/27/2011  . Chronic diastolic heart failure 02/23/2011  . Primary thrombocytopenia 12/08/2010  . Chest pain 10/19/2010  . Other pancytopenia 12/06/2009  . HYPERURICEMIA, ASYMPTOMATIC 07/25/2009  . HYPERGLYCEMIA, FASTING 01/05/2009  . CAD, NATIVE VESSEL 12/21/2008  . DIABETES MELLITUS, CONTROLLED 10/06/2008  . Nonspecific abnormal results of thyroid function study 10/06/2008  . BRADYCARDIA 06/08/2008  . AORTIC STENOSIS 05/15/2008  . EDEMA 05/15/2008  . LEUKOPENIA, CHRONIC 08/04/2007  . UNSPECIFIED ANEMIA 06/23/2007  . ACTION TREMOR 01/14/2007  . POPLITEAL CYST, RIGHT 11/05/2006  . THROAT CANCER 05/13/2006  . POLYP, COLON 05/13/2006  . GOUT 05/13/2006  . THROMBOCYTOPENIA 05/13/2006  . ANXIETY 05/13/2006  . HYPERTENSION 05/13/2006  . Atrial fibrillation 05/13/2006  . CVA 05/13/2006  . GERD 05/13/2006  . DEGENERATIVE DISC DISEASE 05/13/2006  . SPLENOMEGALY 05/13/2006   History  Substance Use Topics  . Smoking status: Former Smoker    Quit date: 01/11/1962  . Smokeless tobacco: Never Used  . Alcohol Use: No   family history includes Cancer in his other; Colon cancer in an other family member; Stroke in his other and another family member. -sister with colon cancer    Objective:   Physical Exam  Well-developed elderly white male in no acute distress, accompanied by his wife. He is somewhat frail and chronically ill-appearing with diffuse bruising on his upper ext. pressure 100/58 pulse 60 height 5 foot 10 weight 177. HEENT; nontraumatic normocephalic EOMI PERRLA sclera anicteric, Supple; no JVD, Cardiovascular; regular rate and rhythm with S1-S2  he does have mechanical valve sounds, Pulmonary; clear bilaterally, Abdomen soft nontender nondistended no palpable mass or hepatosplenomegaly bowel sounds are active, Rectal; exam not done, Extremities; trace edema bilateral ankles, Psych; mood and affect appropriate       Assessment & Plan:  #81  76 -year-old white male with recent group D. strep bacteremia-etiology not clear-there is an association between group D. strep and colon cancer and patient was therefore referred here for consideration of colonoscopy. #2 generalized fatigue malaise and anorexia as well as loss of taste all secondary to infection and antibiotics #3 personal history of adenomatous colon polyps last colonoscopy 2002 #4 positive family history of colon cancer the patient's sister #5 status post aortic valve replacement on aspirin #6 history of CVA #7 coronary artery disease #8 chronic thrombocytopenia/ITP #9 diabetes mellitus #10 normocytic anemia  Plan;  patient is a poor colonoscopy candidate currently due to advanced age and debilitated state with recent infection. He is at higher risk for colon cancer given family history and personal history of adenomatous polyps He and his wife do not feel that he could undergo a bowel prep for colonoscopy at this time. Will  Do Cologuard stool testing which is a new stool DNA test capable of detecting adenomatous polyps and colon cancers.-If this returns positive then we will need to push forward with colonoscopy, if negative the likelihood of him having a colon cancer is very low and may defer colonoscopy. Nevertheless he is requesting another few weeks to continue to recover prior to making any decisions We will see him back in the office in 3-4 weeks for followup

## 2012-12-30 NOTE — Patient Instructions (Signed)
We made you a follow up appointment with Dr. Marina Goodell on 01-28-2013 at 1:30 PM.   You will be contacted by the Cologuard company.  They will also  be mailing you a package and information.

## 2012-12-31 ENCOUNTER — Encounter: Payer: Self-pay | Admitting: Infectious Disease

## 2012-12-31 ENCOUNTER — Ambulatory Visit (INDEPENDENT_AMBULATORY_CARE_PROVIDER_SITE_OTHER): Payer: Medicare Other | Admitting: Infectious Disease

## 2012-12-31 VITALS — BP 156/65 | HR 61 | Temp 97.7°F | Wt 179.5 lb

## 2012-12-31 DIAGNOSIS — A491 Streptococcal infection, unspecified site: Secondary | ICD-10-CM

## 2012-12-31 DIAGNOSIS — R7881 Bacteremia: Secondary | ICD-10-CM

## 2012-12-31 DIAGNOSIS — T889XXS Complication of surgical and medical care, unspecified, sequela: Secondary | ICD-10-CM

## 2012-12-31 DIAGNOSIS — K5900 Constipation, unspecified: Secondary | ICD-10-CM

## 2012-12-31 DIAGNOSIS — T826XXS Infection and inflammatory reaction due to cardiac valve prosthesis, sequela: Secondary | ICD-10-CM

## 2012-12-31 NOTE — Progress Notes (Signed)
Agree with initial assessment and plan. I do not feel strongly about colonoscopy in this patient. Amy, please see him back in your clinic for continuity purposes. Thanks

## 2012-12-31 NOTE — Progress Notes (Signed)
Subjective:    Patient ID: [name], male    DOB: 1922/04/10, 79 y.o.   MRN: [id number]  HPI  79 y.o. male with past medical history significant for chronic thrombocytopenia, aortic stenosis atrial fibrillation bradycardia status post aortic valve replacement at [facility] in January 2013 who has felt malaise and fatigue for approximately 3 weeks. No clear-cut etiology was identified but he was given a course of doxycycline in late November. He did have symptoms of "hot flashes and malaise and fatigue that worsened with worsenign anorexia and lethargy. He was mid of the hospitalist service and found to have bacteremia with STreptococcus bovis with PCN MIC of .064  He was changed over to high dose rocephin 2 grams daily and blood cultures had cleared by 12/15/12.  He had TEE was negative for vegetation and he was slated to finish 3 weeks of IV antibiotics but I would like to go to at least 6 weeks .   He has no more loose stools appetite is better. PICC line had bleed that stoped when eh went to ED and had bandage re-dressed. No new skin lesion. He has not had panorex, He will not have colonoscopy unless more pressing signs for colonic neoplasm per GI.    Review of Systems  Constitutional: Positive for fatigue. Negative for fever, chills, diaphoresis, activity change, appetite change and unexpected weight change.  HENT: Negative for congestion, rhinorrhea, sinus pressure, sneezing, sore throat and trouble swallowing.   Respiratory: Negative for cough, shortness of breath, wheezing and stridor.   Cardiovascular: Positive for leg swelling.  Gastrointestinal: Positive for constipation. Negative for nausea, vomiting, abdominal pain, diarrhea, blood in stool, abdominal distention and anal bleeding.  Genitourinary: Negative for dysuria, hematuria, flank pain and difficulty urinating.  Musculoskeletal: Negative for arthralgias, back pain, gait problem, joint swelling and myalgias.  Skin:  Negative for color change, pallor, rash and wound.  Neurological: Negative for dizziness, tremors, weakness and light-headedness.  Hematological: Negative for adenopathy. Bruises/bleeds easily.  Psychiatric/Behavioral: Negative for behavioral problems, confusion, sleep disturbance, dysphoric mood, decreased concentration and agitation.       Objective:   Physical Exam  Constitutional: He is oriented to person, place, and time. No distress.  HENT:  Head: Normocephalic and atraumatic.  Mouth/Throat: Oropharynx is clear and moist. No oropharyngeal exudate.  Eyes: Conjunctivae and EOM are normal. Pupils are equal, round, and reactive to light. No scleral icterus.  Neck: Normal range of motion. Neck supple. No JVD present.  Cardiovascular: Normal rate and regular rhythm.  Exam reveals no gallop and no friction rub.   Murmur heard.  Systolic murmur is present with a grade of 3/6  Pulmonary/Chest: Effort normal and breath sounds normal. No respiratory distress. He has no wheezes. He has no rales. He exhibits no tenderness.  Abdominal: He exhibits no distension and no mass. There is no tenderness. There is no rebound and no guarding.  Musculoskeletal: He exhibits no edema and no tenderness.  Lymphadenopathy:    He has no cervical adenopathy.  Neurological: He is alert and oriented to person, place, and time. He exhibits normal muscle tone. Coordination normal.  Skin: Skin is warm and dry. He is not diaphoretic. No pallor.  See pictures  Psychiatric: He has a normal mood and affect. His behavior is normal. Judgment and thought content normal.   Hyperpigmented skin on bilateral forearms, no new splinters, janeways or oslers see two pics below      Lower teeth still intact  PICC line intact and clean           Assessment & Plan:  79 year old with aortic stenosis atrial fibrillation bradycardia status post aortic valve replacement at [facility] in January 2013 admitted with  smoldering infection found to have strep bovis in blood cultures pCN MIC =.06   TEE was clean for vegetations  While TEE was negative he could have had endocarditis and embolized the vegetation I remain concerned for endocarditis.  We are NOT giving him textbook rx for prosthetic valve endocarditis which would be rocephin 2g daily plus gentamicin x 6 weeks  While I AM NOT wanting to hazard gentamicin in him I would like to extend his therapy to 6 weeks which would January 18th.    Will check panorex to evalute for dental infection  OK to put off colonoscopy  I spent greater than 45 minutes with the patient including greater than 50% of time in face to face counsel of the patient and in coordination of their care.   Constipation: not big issue at present   TTPenia: stable

## 2013-01-02 ENCOUNTER — Ambulatory Visit (HOSPITAL_COMMUNITY)
Admission: RE | Admit: 2013-01-02 | Discharge: 2013-01-02 | Disposition: A | Discharge status: Home / Self Care | Payer: Medicare Other | Source: Ambulatory Visit | Attending: Infectious Disease | Admitting: Infectious Disease

## 2013-01-02 DIAGNOSIS — R7881 Bacteremia: Secondary | ICD-10-CM

## 2013-01-02 DIAGNOSIS — A419 Sepsis, unspecified organism: Secondary | ICD-10-CM | POA: Insufficient documentation

## 2013-01-04 ENCOUNTER — Encounter (HOSPITAL_COMMUNITY): Payer: Self-pay | Admitting: Emergency Medicine

## 2013-01-04 ENCOUNTER — Inpatient Hospital Stay (HOSPITAL_COMMUNITY)
Admission: EM | Admit: 2013-01-04 | Discharge: 2013-01-06 | DRG: 308 | Disposition: A | Discharge status: Home Health | Payer: Medicare Other | Attending: Cardiovascular Disease | Admitting: Cardiovascular Disease

## 2013-01-04 ENCOUNTER — Other Ambulatory Visit: Payer: Self-pay

## 2013-01-04 ENCOUNTER — Emergency Department (HOSPITAL_COMMUNITY): Payer: Medicare Other

## 2013-01-04 DIAGNOSIS — B952 Enterococcus as the cause of diseases classified elsewhere: Secondary | ICD-10-CM | POA: Diagnosis present

## 2013-01-04 DIAGNOSIS — R079 Chest pain, unspecified: Secondary | ICD-10-CM

## 2013-01-04 DIAGNOSIS — G250 Essential tremor: Secondary | ICD-10-CM | POA: Diagnosis present

## 2013-01-04 DIAGNOSIS — R609 Edema, unspecified: Secondary | ICD-10-CM

## 2013-01-04 DIAGNOSIS — E43 Unspecified severe protein-calorie malnutrition: Secondary | ICD-10-CM | POA: Diagnosis present

## 2013-01-04 DIAGNOSIS — D696 Thrombocytopenia, unspecified: Secondary | ICD-10-CM

## 2013-01-04 DIAGNOSIS — R001 Bradycardia, unspecified: Secondary | ICD-10-CM

## 2013-01-04 DIAGNOSIS — Z79899 Other long term (current) drug therapy: Secondary | ICD-10-CM

## 2013-01-04 DIAGNOSIS — D61818 Other pancytopenia: Secondary | ICD-10-CM

## 2013-01-04 DIAGNOSIS — M109 Gout, unspecified: Secondary | ICD-10-CM | POA: Diagnosis present

## 2013-01-04 DIAGNOSIS — Z8673 Personal history of transient ischemic attack (TIA), and cerebral infarction without residual deficits: Secondary | ICD-10-CM

## 2013-01-04 DIAGNOSIS — I872 Venous insufficiency (chronic) (peripheral): Secondary | ICD-10-CM | POA: Diagnosis present

## 2013-01-04 DIAGNOSIS — N289 Disorder of kidney and ureter, unspecified: Secondary | ICD-10-CM

## 2013-01-04 DIAGNOSIS — Z952 Presence of prosthetic heart valve: Secondary | ICD-10-CM

## 2013-01-04 DIAGNOSIS — I480 Paroxysmal atrial fibrillation: Secondary | ICD-10-CM

## 2013-01-04 DIAGNOSIS — I6350 Cerebral infarction due to unspecified occlusion or stenosis of unspecified cerebral artery: Secondary | ICD-10-CM | POA: Diagnosis present

## 2013-01-04 DIAGNOSIS — R7881 Bacteremia: Secondary | ICD-10-CM

## 2013-01-04 DIAGNOSIS — I700 Atherosclerosis of aorta: Secondary | ICD-10-CM

## 2013-01-04 DIAGNOSIS — I10 Essential (primary) hypertension: Secondary | ICD-10-CM

## 2013-01-04 DIAGNOSIS — I359 Nonrheumatic aortic valve disorder, unspecified: Secondary | ICD-10-CM

## 2013-01-04 DIAGNOSIS — I129 Hypertensive chronic kidney disease with stage 1 through stage 4 chronic kidney disease, or unspecified chronic kidney disease: Secondary | ICD-10-CM | POA: Diagnosis present

## 2013-01-04 DIAGNOSIS — Z954 Presence of other heart-valve replacement: Secondary | ICD-10-CM

## 2013-01-04 DIAGNOSIS — I509 Heart failure, unspecified: Secondary | ICD-10-CM | POA: Diagnosis present

## 2013-01-04 DIAGNOSIS — N183 Chronic kidney disease, stage 3 unspecified: Secondary | ICD-10-CM | POA: Diagnosis present

## 2013-01-04 DIAGNOSIS — E118 Type 2 diabetes mellitus with unspecified complications: Secondary | ICD-10-CM | POA: Diagnosis present

## 2013-01-04 DIAGNOSIS — K219 Gastro-esophageal reflux disease without esophagitis: Secondary | ICD-10-CM | POA: Diagnosis present

## 2013-01-04 DIAGNOSIS — D649 Anemia, unspecified: Secondary | ICD-10-CM

## 2013-01-04 DIAGNOSIS — R600 Localized edema: Secondary | ICD-10-CM

## 2013-01-04 DIAGNOSIS — I498 Other specified cardiac arrhythmias: Secondary | ICD-10-CM | POA: Diagnosis present

## 2013-01-04 DIAGNOSIS — I2510 Atherosclerotic heart disease of native coronary artery without angina pectoris: Secondary | ICD-10-CM

## 2013-01-04 DIAGNOSIS — Z87891 Personal history of nicotine dependence: Secondary | ICD-10-CM

## 2013-01-04 DIAGNOSIS — E039 Hypothyroidism, unspecified: Secondary | ICD-10-CM

## 2013-01-04 DIAGNOSIS — I4891 Unspecified atrial fibrillation: Principal | ICD-10-CM

## 2013-01-04 DIAGNOSIS — Z7982 Long term (current) use of aspirin: Secondary | ICD-10-CM

## 2013-01-04 DIAGNOSIS — I5032 Chronic diastolic (congestive) heart failure: Secondary | ICD-10-CM

## 2013-01-04 DIAGNOSIS — E119 Type 2 diabetes mellitus without complications: Secondary | ICD-10-CM | POA: Diagnosis present

## 2013-01-04 HISTORY — DX: Personal history of other medical treatment: Z92.89

## 2013-01-04 HISTORY — DX: Pure hypercholesterolemia, unspecified: E78.00

## 2013-01-04 HISTORY — DX: Personal history of other diseases of the digestive system: Z87.19

## 2013-01-04 HISTORY — DX: Bacteremia: R78.81

## 2013-01-04 HISTORY — DX: Chronic diastolic (congestive) heart failure: I50.32

## 2013-01-04 HISTORY — DX: Chronic kidney disease, stage 3 (moderate): N18.3

## 2013-01-04 HISTORY — DX: Chronic kidney disease, stage 3 unspecified: N18.30

## 2013-01-04 LAB — CBC
HCT: 31.2 % — ABNORMAL LOW (ref 39.0–52.0)
Hemoglobin: 10.1 g/dL — ABNORMAL LOW (ref 13.0–17.0)
MCH: 28.5 pg (ref 26.0–34.0)
MCHC: 32.4 g/dL (ref 30.0–36.0)
RBC: 3.54 MIL/uL — ABNORMAL LOW (ref 4.22–5.81)

## 2013-01-04 LAB — GLUCOSE, CAPILLARY: Glucose-Capillary: 90 mg/dL (ref 70–99)

## 2013-01-04 LAB — TROPONIN I: Troponin I: <0.3 ng/mL (ref <0.30)

## 2013-01-04 LAB — POCT I-STAT, CHEM 8
BUN: 35 mg/dL — ABNORMAL HIGH (ref 6–23)
Calcium, Ion: 1.21 mmol/L (ref 1.13–1.30)
Creatinine, Ser: 1.8 mg/dL — ABNORMAL HIGH (ref 0.50–1.35)
Glucose, Bld: 156 mg/dL — ABNORMAL HIGH (ref 70–99)
Hemoglobin: 10.9 g/dL — ABNORMAL LOW (ref 13.0–17.0)
TCO2: 25 mmol/L (ref 0–100)

## 2013-01-04 MED ORDER — DILTIAZEM HCL 25 MG/5ML IV SOLN
10.0000 mg | Freq: Once | INTRAVENOUS | Status: AC
  Administered 2013-01-04: 25 mg via INTRAVENOUS
  Filled 2013-01-04: qty 5

## 2013-01-04 MED ORDER — CEFTRIAXONE SODIUM IN DEXTROSE 40 MG/ML IV SOLN
2.0000 g | INTRAVENOUS | Status: DC
  Administered 2013-01-04 – 2013-01-05 (×2): 2 g via INTRAVENOUS
  Filled 2013-01-04 (×3): qty 50

## 2013-01-04 MED ORDER — PANTOPRAZOLE SODIUM 40 MG PO TBEC
40.0000 mg | DELAYED_RELEASE_TABLET | Freq: Every day | ORAL | Status: DC
  Administered 2013-01-04 – 2013-01-06 (×3): 40 mg via ORAL
  Filled 2013-01-04 (×3): qty 1

## 2013-01-04 MED ORDER — ADULT MULTIVITAMIN W/MINERALS CH
1.0000 | ORAL_TABLET | Freq: Every day | ORAL | Status: DC
  Administered 2013-01-04 – 2013-01-06 (×3): 1 via ORAL
  Filled 2013-01-04 (×3): qty 1

## 2013-01-04 MED ORDER — ALLOPURINOL 100 MG PO TABS
100.0000 mg | ORAL_TABLET | Freq: Every day | ORAL | Status: DC
  Administered 2013-01-04 – 2013-01-06 (×3): 100 mg via ORAL
  Filled 2013-01-04 (×3): qty 1

## 2013-01-04 MED ORDER — FUROSEMIDE 80 MG PO TABS
80.0000 mg | ORAL_TABLET | Freq: Every day | ORAL | Status: DC
  Administered 2013-01-04 – 2013-01-06 (×3): 80 mg via ORAL
  Filled 2013-01-04 (×3): qty 1

## 2013-01-04 MED ORDER — POTASSIUM CHLORIDE ER 10 MEQ PO TBCR
10.0000 meq | EXTENDED_RELEASE_TABLET | Freq: Every day | ORAL | Status: DC
  Administered 2013-01-04 – 2013-01-06 (×3): 10 meq via ORAL
  Filled 2013-01-04 (×3): qty 1

## 2013-01-04 MED ORDER — FUROSEMIDE 10 MG/ML IJ SOLN
40.0000 mg | Freq: Once | INTRAMUSCULAR | Status: AC
  Administered 2013-01-04: 40 mg via INTRAVENOUS
  Filled 2013-01-04: qty 4

## 2013-01-04 MED ORDER — ONDANSETRON HCL 4 MG/2ML IJ SOLN: 4.0000 mg | Freq: Four times a day (QID) | INTRAMUSCULAR | Status: DC | PRN

## 2013-01-04 MED ORDER — SODIUM CHLORIDE 0.9 % IV SOLN: 250.0000 mL | INTRAVENOUS | Status: DC | PRN

## 2013-01-04 MED ORDER — LORAZEPAM 0.5 MG PO TABS
0.5000 mg | ORAL_TABLET | Freq: Four times a day (QID) | ORAL | Status: DC | PRN
  Administered 2013-01-04 – 2013-01-05 (×2): 0.5 mg via ORAL
  Filled 2013-01-04 (×3): qty 1

## 2013-01-04 MED ORDER — LEVOTHYROXINE SODIUM 25 MCG PO TABS
25.0000 ug | ORAL_TABLET | Freq: Every day | ORAL | Status: DC
  Administered 2013-01-04 – 2013-01-06 (×3): 25 ug via ORAL
  Filled 2013-01-04 (×4): qty 1

## 2013-01-04 MED ORDER — ACETAMINOPHEN 325 MG PO TABS: 650.0000 mg | ORAL_TABLET | ORAL | Status: DC | PRN

## 2013-01-04 MED ORDER — ASPIRIN 325 MG PO TABS
325.0000 mg | ORAL_TABLET | Freq: Every day | ORAL | Status: DC
  Administered 2013-01-04 – 2013-01-06 (×3): 325 mg via ORAL
  Filled 2013-01-04 (×3): qty 1

## 2013-01-04 MED ORDER — AMLODIPINE BESYLATE 5 MG PO TABS
5.0000 mg | ORAL_TABLET | Freq: Every day | ORAL | Status: DC
  Administered 2013-01-05 – 2013-01-06 (×2): 5 mg via ORAL
  Filled 2013-01-04 (×3): qty 1

## 2013-01-04 MED ORDER — AMIODARONE HCL 200 MG PO TABS
200.0000 mg | ORAL_TABLET | Freq: Every day | ORAL | Status: DC
  Administered 2013-01-04 – 2013-01-06 (×3): 200 mg via ORAL
  Filled 2013-01-04 (×3): qty 1

## 2013-01-04 MED ORDER — SODIUM CHLORIDE 0.9 % IJ SOLN: 3.0000 mL | INTRAMUSCULAR | Status: DC | PRN

## 2013-01-04 MED ORDER — METOPROLOL TARTRATE 12.5 MG HALF TABLET
12.5000 mg | ORAL_TABLET | Freq: Every day | ORAL | Status: DC
  Administered 2013-01-06: 12.5 mg via ORAL
  Filled 2013-01-04 (×3): qty 1

## 2013-01-04 MED ORDER — SODIUM CHLORIDE 0.9 % IJ SOLN
3.0000 mL | Freq: Two times a day (BID) | INTRAMUSCULAR | Status: DC
  Administered 2013-01-04 – 2013-01-05 (×2): 3 mL via INTRAVENOUS

## 2013-01-04 MED ORDER — OMEGA-3-ACID ETHYL ESTERS 1 G PO CAPS
1.0000 g | ORAL_CAPSULE | Freq: Every day | ORAL | Status: DC
  Administered 2013-01-04 – 2013-01-06 (×3): 1 g via ORAL
  Filled 2013-01-04 (×3): qty 1

## 2013-01-04 NOTE — Progress Notes (Signed)
Reported received from ED nurse. Awaiting patient's arrival to 2 west. [place], [place] R

## 2013-01-04 NOTE — Progress Notes (Signed)
Patient HR dropped to 35, Non-sustaining, patient asymptomatic. HR currently running 55-65. MD made aware, orders given. Will continue to monitor closely. Stanton Kidney R

## 2013-01-04 NOTE — ED Provider Notes (Signed)
CSN: [id number]     Arrival date & time 01/04/13  0507 History   First MD Initiated Contact with Patient 01/04/13 0510     Chief Complaint  Patient presents with  . Irregular Heart Beat   (Consider location/radiation/quality/duration/timing/severity/associated sxs/prior Treatment) HPI History provided by patient. Woke up around midnight with palpitations and heart racing. Patient has a history of atrial fibrillation. He has not been in atrial fibrillation and he believes about 3 years. He takes aspirin but no other blood thinners/anticoagulation. No chest pain. No shortness of breath. Complains of not feeling well but denies any other specific symptoms. No nausea vomiting or diarrhea. No leg pain or leg swelling. Symptoms moderate in severity. No change in medications.. No known sick contacts. Symptoms moderate in severity. Past Medical History  Diagnosis Date  . Stroke   . Atrial fibrillation     amiodarone therapy; not a candidate for anticoagulation  . Bradycardia   . Aortic stenosis     echo 10/12: EF 65-70%, grade 2 diast dysfxn, severe AS, mean gradient 60 mmHg, mod LAE, PASP 47;  s/p minimally invasive tissue AVR with Dr. Silvestre Mesi at [facility] 01/2011 (pre-AVR cath with no obs CAD)  . Hypertension   . Diabetes mellitus   . Anemia   . Action tremor   . Thrombocytopenia     Dr. [name]  . Throat cancer     s/p resection  . Colon polyp   . Anxiety   . Splenomegaly   . Gout   . GERD (gastroesophageal reflux disease)   . Hypothyroidism   . Leukopenia     Chronic pancytopenia  . Degenerative disc disease   . Joint effusion, knee     left knee  . Synovial cyst of popliteal space   . Cellulitis of left leg 10/11-16/2012  . CKD (chronic kidney disease)   . CHF (congestive heart failure)   . Heart murmur    Past Surgical History  Procedure Laterality Date  . Cholecystectomy    . Joint effusion      left knee  . Aortic valve replacement  01/23/2011    via minimally invasive  approach per Dr Silvestre Mesi, [facility]  . Tee without cardioversion N/A 12/15/2012    Procedure: TRANSESOPHAGEAL ECHOCARDIOGRAM (TEE);  Surgeon: Lars Masson, MD;  Location: [facility] ENDOSCOPY;  Service: Cardiovascular;  Laterality: N/A;  . Hernia repair     Family History  Problem Relation Age of Onset  . Colon cancer    . Stroke    . Cancer Other     colon  . Stroke Other    History  Substance Use Topics  . Smoking status: Former Smoker    Quit date: 01/11/1962  . Smokeless tobacco: Never Used  . Alcohol Use: No    Review of Systems  Constitutional: Negative for fever and chills.  Eyes: Negative for visual disturbance.  Respiratory: Negative for shortness of breath.   Cardiovascular: Positive for palpitations. Negative for chest pain.  Gastrointestinal: Negative for abdominal pain.  Genitourinary: Negative for dysuria.  Musculoskeletal: Negative for back pain, neck pain and neck stiffness.  Skin: Negative for rash.  Neurological: Negative for headaches.  All other systems reviewed and are negative.    Allergies  Penicillins and Neomycin-bacitracin zn-polymyx  Home Medications   Current Outpatient Rx  Name  Route  Sig  Dispense  Refill  . allopurinol (ZYLOPRIM) 100 MG tablet   Oral   Take 1 tablet (100 mg total) by mouth daily.  90 tablet   3   . amiodarone (PACERONE) 200 MG tablet   Oral   Take 0.5 tablets (100 mg total) by mouth daily.   1 tablet   0   . amLODipine (NORVASC) 5 MG tablet   Oral   Take 1 tablet (5 mg total) by mouth daily.   90 tablet   3   . aspirin 325 MG tablet   Oral   Take 325 mg by mouth daily.           . cefTRIAXone (ROCEPHIN) 2 G SOLR injection   Intravenous   Inject 2 g into the vein daily. Blood stream infection         . Cholecalciferol (VITAMIN D) 1000 UNITS capsule   Oral   Take 1 capsule (1,000 Units total) by mouth daily.   90 capsule   3   . colchicine 0.6 MG tablet   Oral   Take 0.6 mg by mouth as needed. Take  one tablet by mouth daily during an acute episode of gout.         . fish oil-omega-3 fatty acids 1000 MG capsule   Oral   Take 1 g by mouth daily.           . Flaxseed, Linseed, (FLAX SEED OIL) 1000 MG CAPS   Oral   Take 1 capsule by mouth daily.          . furosemide (LASIX) 40 MG tablet   Oral   Take 2 tablets (80 mg total) by mouth daily.   180 tablet   3   . levothyroxine (LEVOTHROID) 25 MCG tablet   Oral   Take 1 tablet (25 mcg total) by mouth daily.   90 tablet   3   . LORazepam (ATIVAN) 0.5 MG tablet   Oral   Take 0.5 mg by mouth every 6 (six) hours as needed for anxiety or sleep.         . metoprolol tartrate (LOPRESSOR) 25 MG tablet   Oral   Take 0.5 tablets (12.5 mg total) by mouth daily.   90 tablet   3   . Multiple Vitamin (MULTIVITAMIN) tablet   Oral   Take 1 tablet by mouth daily.           [name] Kitchen omeprazole (PRILOSEC) 20 MG capsule   Oral   Take 1 capsule (20 mg total) by mouth daily.   90 capsule   1   . potassium chloride (K-DUR) 10 MEQ tablet   Oral   Take 1 tablet (10 mEq total) by mouth daily.   90 tablet   3   . vitamin C (ASCORBIC ACID) 500 MG tablet   Oral   Take 1 tablet (500 mg total) by mouth daily.   90 tablet   3    BP 91/53  Pulse 104  Temp(Src) 98.2 F (36.8 C) (Oral)  Resp 16  SpO2 97% Physical Exam  Constitutional: He is oriented to person, place, and time. He appears well-developed and well-nourished.  HENT:  Head: Normocephalic and atraumatic.  Eyes: EOM are normal. Pupils are equal, round, and reactive to light.  Neck: Neck supple.  Cardiovascular: Intact distal pulses.   Rapid and irregular  Pulmonary/Chest: Effort normal. No respiratory distress. He exhibits no tenderness.  Abdominal: Soft. He exhibits no distension. There is no tenderness.  Musculoskeletal: Normal range of motion. He exhibits no edema.  Neurological: He is alert and oriented to person, place, and time.  Skin: Skin is warm and dry.     ED Course  Procedures (including critical care time)   Results for orders placed during the hospital encounter of 01/04/13  CBC      Result Value Range   WBC 2.8 (*) 4.0 - 10.5 K/uL   RBC 3.54 (*) 4.22 - 5.81 MIL/uL   Hemoglobin 10.1 (*) 13.0 - 17.0 g/dL   HCT 09.8 (*) 11.9 - 14.7 %   MCV 88.1  78.0 - 100.0 fL   MCH 28.5  26.0 - 34.0 pg   MCHC 32.4  30.0 - 36.0 g/dL   RDW 82.9 (*) 56.2 - 13.0 %   Platelets 42 (*) 150 - 400 K/uL  POCT I-STAT, CHEM 8      Result Value Range   Sodium 142  135 - 145 mEq/L   Potassium 4.2  3.5 - 5.1 mEq/L   Chloride 104  96 - 112 mEq/L   BUN 35 (*) 6 - 23 mg/dL   Creatinine, Ser 8.65 (*) 0.50 - 1.35 mg/dL   Glucose, Bld 784 (*) 70 - 99 mg/dL   Calcium, Ion 6.96  2.95 - 1.30 mmol/L   TCO2 25  0 - 100 mmol/L   Hemoglobin 10.9 (*) 13.0 - 17.0 g/dL   HCT 28.4 (*) 13.2 - 44.0 %  POCT I-STAT TROPONIN I      Result Value Range   Troponin i, poc 0.03  0.00 - 0.08 ng/mL   Comment 3            Dg Orthopantogram  01/02/2013   CLINICAL DATA:  Sepsis, likely endocarditis.  EXAM: ORTHOPANTOGRAM/PANORAMIC  COMPARISON:  None.  FINDINGS: No periapical lucencies are seen. Multiple lower midline teeth remain. Remaining teeth have been extracted. Possible small cyst body of mandible on the right and slight cortical irregularity along the left body of mandible articular surface both likely chronic.  IMPRESSION: No evidence for periodontal disease. The observed lucencies in the mandible are likely chronic.   Electronically Signed   By: Davonna Belling M.D.   On: 01/02/2013 12:24   Dg Chest 2 View  12/12/2012   CLINICAL DATA:  Weakness.  EXAM: CHEST  2 VIEW  COMPARISON:  12/08/2012.  FINDINGS: Mildly enlarged cardiac silhouette. Clear lungs. Stable tiny calcified granulomata on the left. Stable calcified hilar lymph nodes. The aorta remains tortuous. Diffuse osteopenia. Upper abdominal surgical clips on the right compatible with a previous cholecystectomy if.   IMPRESSION: 1. No acute abnormality. 2. Mild cardiomegaly. 3. Previous granulomatous infection.   Electronically Signed   By: Gordan Payment M.D.   On: 12/12/2012 19:25   Dg Chest 2 View  12/08/2012   CLINICAL DATA:  Cough, dyspnea, anemia.  EXAM: CHEST  2 VIEW  COMPARISON:  Multiple priors  FINDINGS: The cardiomediastinal silhouette is unchanged. Evidence of prior granulomatous disease. No focal infiltrate or edema. No pneumothorax. Minimal blunting of the left costophrenic angle. No acute osseous abnormality.  IMPRESSION: Minimal blunting of the left costophrenic angle, consistent with a tiny pleural effusion. No focal infiltrate.   Electronically Signed   By: Jerene Dilling M.D.   On: 12/08/2012 15:32   US Abdomen Complete  12/13/2012   CLINICAL DATA:  Left upper quadrant pain, history of splenomegaly  EXAM: ULTRASOUND ABDOMEN COMPLETE  COMPARISON:  CT abdomen/ pelvis 06/14/2007  FINDINGS: Gallbladder:  The gallbladder is surgically absent.  Common bile duct:  Diameter: Within normal limits at 4.6 mm  Liver:  No focal lesion  identified. Within normal limits in parenchymal echogenicity. Trace perihepatic ascites.  IVC:  No abnormality visualized.  Pancreas:  Visualized portion unremarkable.  Spleen:  Marked splenomegaly. The splenic volume is approximated at 3781 cc and the spleen measures approximately 25 cm in craniocaudal dimension.  Right Kidney:  Length: 11.8 cm. Diffuse cortical thinning. The remaining renal parenchyma appears echogenic. There are multiple sonographically simple cysts, the largest measuring up to 5.4 cm exophytic from the lower pole.  Left Kidney:  Length: 13.9 cm. Diffuse cortical thinning. The remaining renal parenchyma appears echogenic. Sonographically simple renal cysts. The largest measures 6.5 cm exophytic from the lower pole.  Abdominal aorta:  No aneurysm visualized.  Partially obscured by overlying bowel gas.  Other findings:  None.  IMPRESSION: 1. The marked splenomegaly.  The splenic volume is estimated at 3781 cc. Comparing across modalities to the prior CT of the abdomen and pelvis from June of 2009, the splenic size is similar to mildly enlarged. 2. Trace perihepatic ascites. 3. Bilateral renal cortical thinning with echogenic parenchyma suggesting underlying medical renal disease. 4. Bilateral large but sonographically simple renal cysts. On the left, an exophytic cyst from the lower pole measures up to 6.5 cm.   Electronically Signed   By: Malachy Moan M.D.   On: 12/13/2012 08:20   Dg Chest Portable 1 View  01/04/2013   CLINICAL DATA:  Irregular heart beat.  EXAM: PORTABLE CHEST - 1 VIEW  COMPARISON:  Chest radiograph December 12, 2012.  FINDINGS: Cardiac silhouette appears mildly enlarged, even with consideration to this low inspiratory portable examination with crowded vasculature markings. Status post median sternotomy. Mild chronic interstitial changes without pleural effusions or pleural focal consolidations. Scattered granulomata with bulky left hilar calcified lymph nodes. No pneumothorax.  Degenerative changes shoulders. Multiple EKG lines overlie the patient and may obscure subtle underlying pathology. Remote left posterior rib fractures. Apparent catheter projects in the included right humerus soft tissues.  IMPRESSION: Stable cardiomegaly and mild chronic interstitial changes without superimposed acute cardiopulmonary process.  Remote granulomatous changes.  Apparent catheter within the right humerus soft tissues, recommend clinical correlation.   Electronically Signed   By: Awilda Metro   On: 01/04/2013 06:13     Date: 01/04/2013  Rate: 102  Rhythm: sinus tachycardia  QRS Axis: left  Intervals: normal  ST/T Wave abnormalities: nonspecific ST changes  Conduction Disutrbances:none  Narrative Interpretation:   Old EKG Reviewed: changes noted previous EKG dated 12/12/2012 demonstrates normal sinus   Cardizem 10 mg IV provided for heart rate 110  to 120s  7:36 AM on recheck symptomatically feels unchanged. Heart rate now in the 90s, systolic blood pressure 90s. Discussed with cardiology midlevel on call - will evaluate bedside for admit  MDM  Diagnosis: Atrial fibrillation, rate controlled with Cardizem  Evaluated with EKG, labs and chest x-ray all reviewed as above. Cardiology consult/ admit    Sunnie Nielsen, MD 01/05/13 [phone]

## 2013-01-04 NOTE — ED Notes (Signed)
Pt to ed from home by ems. sts feels like his heart is racing.  Has hx of same.  Was given 324mg  asa enroute by ems. Denies pain, nausea, shortness of breath.

## 2013-01-04 NOTE — ED Notes (Signed)
Patient was at home felt funny last night brought in by ems, vss

## 2013-01-04 NOTE — H&P (Signed)
The patient was seen and examined, and I agree with the assessment and plan as documented above. Patient denies being in atrial fibrillation since prior to his valve replacement surgery. Problems with bradycardia as noted above, on anything higher than 12.5 mg metoprolol daily. Will give IV amiodarone 150 mg with the hope of cardioverting him, with a subsequent increases in oral amiodarone to 200 mg daily. He says he's gained 4 lbs from his baseline. He has some chronic lower extremity edema and is maintained on 80 mg daily. Will give a one-time dose of 40 IV, given his CKD. No anticoagulation given thrombocytopenia.

## 2013-01-04 NOTE — H&P (Signed)
Patient ID: [name] MRN: [id number], DOB/AGE: [birth year]   Admit date: 01/04/2013  Primary Physician: [name], MD Primary Cardiologist: Judie Petit. Excell Seltzer, MD   Pt. Profile:  79 y/o male with h/o AS s/p bioprosth AVR, PAF on amio (no coumadin 2/2 falls), and recent bacteremia on ongoing abx therapy, who presented to ed this AM with palpitations/afib.  Problem List  Past Medical History  Diagnosis Date  . Stroke   . Atrial fibrillation     a. amiodarone therapy; not felt to be a candidate for anticoagulation - bruises easily.  . Bradycardia     a. only tolerates metoprolol 12.5mg  daily (bradycardic w/ bid dosing).  . Aortic stenosis     a. Previously severe -> s/p minimally invasive tissue AVR with Dr. Silvestre Mesi at [facility] 01/2011 (pre-AVR cath with no obs CAD);  b. 12/2012 TEE: EF 60-65%, no veg, mild AS, triv AI.  [name] Kitchen Hypertension   . Diabetes mellitus   . Anemia   . Action tremor   . Thrombocytopenia     Dr. [name]  . Throat cancer     s/p resection  . Colon polyp   . Anxiety   . Splenomegaly   . Gout   . GERD (gastroesophageal reflux disease)   . Hypothyroidism   . Leukopenia     Chronic pancytopenia  . Degenerative disc disease   . Joint effusion, knee     left knee  . Synovial cyst of popliteal space   . Cellulitis of left leg 10/11-16/2012  . CKD (chronic kidney disease), stage III   . Chronic diastolic CHF (congestive heart failure)     a. 12/15/2012 TEE: EF 60-65%, no veg.  . Bacteremia     a. 12/2012 - S bovis;  b. 12/2012 TEE w/o veg;  c. Seeing ID->Rocephin therapy extended to 01/25/2013 via PICC for possible endocarditis (No veg on TEE).    Past Surgical History  Procedure Laterality Date  . Cholecystectomy    . Joint effusion      left knee  . Aortic valve replacement  01/23/2011    via minimally invasive approach per Dr Silvestre Mesi, [facility]  . Tee without cardioversion N/A 12/15/2012    Procedure: TRANSESOPHAGEAL ECHOCARDIOGRAM (TEE);  Surgeon: Lars Masson, MD;  Location: [facility] ENDOSCOPY;  Service: Cardiovascular;  Laterality: N/A;  . Hernia repair      Allergies  Allergies  Allergen Reactions  . Penicillins Other (See Comments)    Does not remember reaction (~year 1950)  . Neomycin-Bacitracin Zn-Polymyx     ? Reaction (thinks he remembers redness)   HPI  79 year old male with prior history of aortic stenosis status post bioprosthetic aortic valve replacement at [facility] in January 2013. He also has a history of paroxysmal atrial fibrillation and has been maintained in sinus rhythm on amiodarone therapy. He has a history of thrombocytopenia and easy bruisability and is not on long-term anticoagulation other than aspirin. He also has a history of bradycardia which limits his beta blocker dose to 12.5 mg daily. He was seen by Dr. Excell Seltzer in clinic in November at which time his Lasix dose was adjusted secondary to chronic venous stasis in lower extremity edema. He was seen a few weeks later back in clinic secondary to progressive fatigue. He had no localizing symptoms and was placed on doxycycline. He was referred to heme-onc and was found to be anemic. He received outpatient transfusion. Despite this, he continued to experience weakness and fatigue and was subsequently  hospitalized in early December and later found to have Streptococcus bovis bacteremia. Transesophageal echocardiogram did not show any vegetation. Patient was seen by infectious disease discharged home with a PICC line and Rocephin therapy. He has since followed up in infectious disease clinic and plan at this point is to continue Rocephin therapy for total of 6 weeks left (end date January 18). Panorex was performed on December 26 and did not show any evidence for periodontal disease.  Since his discharge from the hospital, overall patient strength is been steadily improving. He had not had any chest pain or dyspnea exertion. He continues to have mild chronic lower extremity edema.  Approximately 12 to 12:30 this morning he awoke with irregular palpitations and tachycardia. He recognized this as being in atrial fibrillation. He also noted intermittent squeezing chest pain lasting a few seconds at a time and resolving spontaneously. He denies any presyncope or dyspnea. He says that when he got up he felt as though his extremities were very cold to his touch the when he asked his wife about it she reassured him that his extremities were warm. Due to ongoing palpitations, EMS was called and he was found to be in atrial fibrillation with a rapid ventricular response. He was treated with diltiazem 10 mg IV x1 with improvement in heart rate into the 80s to 90s. At this point is only complaining of palpitations.   Home Medications  Prior to Admission medications   Medication Sig Start Date End Date Taking? Authorizing Provider  allopurinol (ZYLOPRIM) 100 MG tablet Take 1 tablet (100 mg total) by mouth daily. 11/11/12  Yes Tonny Bollman, MD  amiodarone (PACERONE) 200 MG tablet Take 0.5 tablets (100 mg total) by mouth daily. 09/02/12  Yes Tonny Bollman, MD  amLODipine (NORVASC) 5 MG tablet Take 1 tablet (5 mg total) by mouth daily. 11/11/12  Yes Tonny Bollman, MD  aspirin 325 MG tablet Take 325 mg by mouth daily.     Yes Historical Provider, MD  cefTRIAXone (ROCEPHIN) 2 G SOLR injection Inject 2 g into the vein daily. Blood stream infection   Yes Historical Provider, MD  Cholecalciferol (VITAMIN D) 1000 UNITS capsule Take 1 capsule (1,000 Units total) by mouth daily. 01/05/11  Yes Gaylord Shih, MD  colchicine 0.6 MG tablet Take 0.6 mg by mouth as needed. Take one tablet by mouth daily during an acute episode of gout. 04/03/11  Yes Gaylord Shih, MD  fish oil-omega-3 fatty acids 1000 MG capsule Take 1 g by mouth daily.     Yes Historical Provider, MD  Flaxseed, Linseed, (FLAX SEED OIL) 1000 MG CAPS Take 1 capsule by mouth daily.    Yes Historical Provider, MD  furosemide (LASIX) 40 MG  tablet Take 2 tablets (80 mg total) by mouth daily. 11/11/12  Yes Tonny Bollman, MD  levothyroxine (LEVOTHROID) 25 MCG tablet Take 1 tablet (25 mcg total) by mouth daily. 10/10/12 10/10/13 Yes Tonny Bollman, MD  LORazepam (ATIVAN) 0.5 MG tablet Take 0.5 mg by mouth every 6 (six) hours as needed for anxiety or sleep.   Yes Historical Provider, MD  metoprolol tartrate (LOPRESSOR) 25 MG tablet Take 0.5 tablets (12.5 mg total) by mouth daily. 11/25/12  Yes Tonny Bollman, MD  Multiple Vitamin (MULTIVITAMIN) tablet Take 1 tablet by mouth daily.     Yes Historical Provider, MD  omeprazole (PRILOSEC) 20 MG capsule Take 1 capsule (20 mg total) by mouth daily. 06/19/12  Yes Peter M [place], MD  potassium chloride (K-DUR) 10  MEQ tablet Take 1 tablet (10 mEq total) by mouth daily. 09/16/12  Yes Tonny Bollman, MD  vitamin C (ASCORBIC ACID) 500 MG tablet Take 1 tablet (500 mg total) by mouth daily. 01/05/11  Yes Gaylord Shih, MD   Family History  Family History  Problem Relation Age of Onset  . Colon cancer    . Stroke    . Cancer Other     colon  . Stroke Other    Social History  History   Social History  . Marital Status: Married    Spouse Name: N/A    Number of Children: 3  . Years of Education: N/A   Occupational History  . Retired Company secretary    Social History Main Topics  . Smoking status: Former Smoker    Quit date: 01/11/1962  . Smokeless tobacco: Never Used  . Alcohol Use: No  . Drug Use: No  . Sexual Activity: No   Other Topics Concern  . Not on file   Social History Narrative   Lives at his farm outside of [place]...does some work   Lives with his wife, been married to her for 69 years,   Smoked until 1964 about 7 cigarettes a day for 30 years   No alcohol history.   No drug history                Review of Systems General:   notable for ongoing weakness and fatigue though this has been improving slowly.No chills, fever, night sweats or weight changes.  Cardiovascular:   as  above, he's been experiencing irregular palpitations and tachycardia this morning. No chest pain, dyspnea on exertion, edema, orthopnea, paroxysmal nocturnal dyspnea. Dermatological: notable for easy bruisability with hyperpigmented forearms and hands as well as bilateral lower legs. He's had some itching around his medial malleolus on the right lower leg.  Respiratory: No cough, dyspnea Urologic: No hematuria, dysuria Abdominal:   No nausea, vomiting, diarrhea, bright red blood per rectum, melena, or hematemesis Neurologic:  No visual changes, wkns, changes in mental status. All other systems reviewed and are otherwise negative except as noted above.  Physical Exam  Blood pressure 95/51, pulse 100, temperature 98 F (36.7 C), temperature source Oral, resp. rate 19, SpO2 97.00%.  General: Pleasant, NAD Psych: Normal affect. Neuro: Alert and oriented X 3. Moves all extremities spontaneously. HEENT: Normal  Neck: Supple without bruits or JVD. Lungs:  Resp regular and unlabored, CTA. Heart:  irregularly irregular with a 3/6 systolic ejection murmur at the right upper sternal border which is transmitted throughout.  Abdomen: Soft, non-tender, non-distended, BS + x 4.  Extremities: No clubbing, cyanosis, chronic venous stasis changes to bilateral lower extremities with 2+ left lower extremity edema and 1+ right lower extremity edema. DP/PT/Radials 1 + and equal bilaterally.  Labs  Troponin [facility] Care Test)  Recent Labs  01/04/13 0553  TROPIPOC 0.03   No results found for this basename: CKTOTAL, CKMB, TROPONINI,  in the last 72 hours Lab Results  Component Value Date   WBC 2.8* 01/04/2013   HGB 10.9* 01/04/2013   HCT 32.0* 01/04/2013   MCV 88.1 01/04/2013   PLT 42* 01/04/2013     Recent Labs Lab 01/04/13 0555  NA 142  K 4.2  CL 104  BUN 35*  CREATININE 1.80*  GLUCOSE 156*   Radiology/Studies  Dg Orthopantogram  01/02/2013   CLINICAL DATA:  Sepsis, likely  endocarditis.  EXAM: ORTHOPANTOGRAM/PANORAMIC  COMPARISON:  None.  FINDINGS: No periapical  lucencies are seen. Multiple lower midline teeth remain. Remaining teeth have been extracted. Possible small cyst body of mandible on the right and slight cortical irregularity along the left body of mandible articular surface both likely chronic.  IMPRESSION: No evidence for periodontal disease. The observed lucencies in the mandible are likely chronic.   Electronically Signed   By: Davonna Belling M.D.   On: 01/02/2013 12:24   Dg Chest Portable 1 View  01/04/2013   CLINICAL DATA:  Irregular heart beat.  EXAM: PORTABLE CHEST - 1 VIEW  COMPARISON:  Chest radiograph December 12, 2012.  IMPRESSION: Stable cardiomegaly and mild chronic interstitial changes without superimposed acute cardiopulmonary process.  Remote granulomatous changes.  Apparent catheter within the right humerus soft tissues, recommend clinical correlation.   Electronically Signed   By: Awilda Metro   On: 01/04/2013 06:13   ECG  Afib, 102, lad - no acute st/t changes.  ASSESSMENT AND PLAN   1. Atrial fibrillation with rapid ventricular response: Patient has a history of paroxysmal atrial fibrillation and has been managed with amiodarone 100 mg daily and low-dose beta blocker therapy. He has a history of bradycardia with rates in the 40s on anything more than 12.5 mg of metoprolol daily.  We will plan to admit and place on IV amio with a 150 mg bolus in the ED.  Hopefully he will convert with additional amio at which point we can put him on 200mg  po daily.  In doing so, we may need to d/c his metoprolol altogether, given his h/o bradycardia.  With significant thrombocytopenia, we will not add heparin at this time.  If he remains in afib by tomorrow morning, we will consider dccv, given that we will still be inside a 48hr window.    2.  AS:  S/p bioprosth AVR - mild AS by last echo a few wks ago.  3.  Strep bovis bacteremia:  No source  identified.  No veg on TEE.  Panorex normal on 12/26.  Cont IV rocephin via picc line as he was receiving at home.  4.  Stage III CKD:  Creat stable @ 1.8.  5.  Pancytopenia:  Avoiding heparin with platelets of 42.  H/H stable.  6.  Hypothyroidism:  TSH nl on 12/6.  7.  Chronic venous stasis and LEE:  Slightly worse.  Will give a dose of IV lasix.  Otw continue home regimen.  8.  GERD:  Cont PPI.  Signed, Nicolasa Ducking, NP 01/04/2013, 8:49 AM

## 2013-01-05 ENCOUNTER — Telehealth: Payer: Self-pay | Admitting: *Deleted

## 2013-01-05 ENCOUNTER — Encounter (HOSPITAL_COMMUNITY): Payer: Self-pay | Admitting: General Practice

## 2013-01-05 DIAGNOSIS — E46 Unspecified protein-calorie malnutrition: Secondary | ICD-10-CM

## 2013-01-05 DIAGNOSIS — R161 Splenomegaly, not elsewhere classified: Secondary | ICD-10-CM

## 2013-01-05 DIAGNOSIS — B952 Enterococcus as the cause of diseases classified elsewhere: Secondary | ICD-10-CM

## 2013-01-05 DIAGNOSIS — C140 Malignant neoplasm of pharynx, unspecified: Secondary | ICD-10-CM

## 2013-01-05 LAB — GLUCOSE, CAPILLARY
Glucose-Capillary: 140 mg/dL — ABNORMAL HIGH (ref 70–99)
Glucose-Capillary: 130 mg/dL — ABNORMAL HIGH (ref 70–99)
Glucose-Capillary: 107 mg/dL — ABNORMAL HIGH (ref 70–99)

## 2013-01-05 LAB — CBC
Hemoglobin: 8.4 g/dL — ABNORMAL LOW (ref 13.0–17.0)
MCH: 28.5 pg (ref 26.0–34.0)
Platelets: 33 10*3/uL — ABNORMAL LOW (ref 150–400)
RBC: 2.95 MIL/uL — ABNORMAL LOW (ref 4.22–5.81)
WBC: 1.5 10*3/uL — ABNORMAL LOW (ref 4.0–10.5)

## 2013-01-05 LAB — OCCULT BLOOD X 1 CARD TO LAB, STOOL: Fecal Occult Bld: NEGATIVE

## 2013-01-05 LAB — BASIC METABOLIC PANEL
BUN: 37 mg/dL — ABNORMAL HIGH (ref 6–23)
CO2: 26 mEq/L (ref 19–32)
Calcium: 8.4 mg/dL (ref 8.4–10.5)
Chloride: 104 mEq/L (ref 96–112)
GFR calc Af Amer: 37 mL/min — ABNORMAL LOW (ref >90)
Glucose, Bld: 132 mg/dL — ABNORMAL HIGH (ref 70–99)
Sodium: 138 mEq/L (ref 135–145)

## 2013-01-05 LAB — TROPONIN I: Troponin I: <0.3 ng/mL (ref <0.30)

## 2013-01-05 MED ORDER — TEMAZEPAM 7.5 MG PO CAPS
7.5000 mg | ORAL_CAPSULE | Freq: Every evening | ORAL | Status: DC | PRN
  Administered 2013-01-05 (×2): 7.5 mg via ORAL
  Filled 2013-01-05 (×2): qty 1

## 2013-01-05 MED ORDER — ZOLPIDEM TARTRATE 5 MG PO TABS: 5.0000 mg | ORAL_TABLET | Freq: Every evening | ORAL | Status: DC | PRN

## 2013-01-05 NOTE — Progress Notes (Signed)
[facility]  Patient Status: Active (receiving services up to time of hospitalization)  [facility] is providing the following services: RN, PT and OT . IV antibiotics  If patient discharges after hours, please call [phone].   [facility] 01/05/2013, 11:23 AM

## 2013-01-05 NOTE — Telephone Encounter (Signed)
[facility] POC orders signed and faxed to the attn of Darilyn Knibb

## 2013-01-05 NOTE — Progress Notes (Signed)
Patient ID: [name], male   DOB: Oct 10, 1922, 79 y.o.   MRN: [id number]    Subjective:  Denies SSCP, palpitations or Dyspnea   Objective:  Filed Vitals:   01/04/13 1120 01/04/13 1300 01/04/13 1323 01/05/13 0527  BP: 105/54 111/65  119/53  Pulse: 80 90  56  Temp:    97.7 F (36.5 C)  TempSrc:    Oral  Resp: 9   12  Weight: 176 lb 9.4 oz (80.1 kg)   172 lb 11.2 oz (78.336 kg)  SpO2: 98% 100% 98% 99%    Intake/Output from previous day:  Intake/Output Summary (Last 24 hours) at 01/05/13 0730 Last data filed at 01/05/13 7829  Gross per 24 hour  Intake    240 ml  Output   1200 ml  Net   -960 ml    Physical Exam: Affect appropriate Elderly white male  HEENT: normal Neck supple with no adenopathy JVP normal no bruits no thyromegaly Lungs clear with no wheezing and good diaphragmatic motion Heart:  S1/S2 SEM through tissue valve no AR  murmur, no rub, gallop or click PMI normal Abdomen: benighn, BS positve, no tenderness, no AAA no bruit.  No HSM or HJR Distal pulses intact with no bruits No edema Neuro non-focal Skin multiple echymosis on arms  PIC line RUE No muscular weakness   Lab Results: Basic Metabolic Panel:  Recent Labs  56/21/30 0555 01/04/13 1829 01/05/13 0500  NA 142  --  138  K 4.2  --  3.9  CL 104  --  104  CO2  --   --  26  GLUCOSE 156*  --  132*  BUN 35*  --  37*  CREATININE 1.80*  --  1.81*  CALCIUM  --   --  8.4  MG  --  2.0  --    Liver Function Tests: No results found for this basename: AST, ALT, ALKPHOS, BILITOT, PROT, ALBUMIN,  in the last 72 hours No results found for this basename: LIPASE, AMYLASE,  in the last 72 hours CBC:  Recent Labs  01/04/13 0547 01/04/13 0555 01/05/13 0500  WBC 2.8*  --  1.5*  HGB 10.1* 10.9* 8.4*  HCT 31.2* 32.0* 26.0*  MCV 88.1  --  88.1  PLT 42*  --  33*   Cardiac Enzymes:  Recent Labs  01/04/13 1829 01/05/13 0521  TROPONINI <0.30 <0.30    Imaging: Dg Chest Portable 1  View  01/04/2013   CLINICAL DATA:  Irregular heart beat.  EXAM: PORTABLE CHEST - 1 VIEW  COMPARISON:  Chest radiograph December 12, 2012.  FINDINGS: Cardiac silhouette appears mildly enlarged, even with consideration to this low inspiratory portable examination with crowded vasculature markings. Status post median sternotomy. Mild chronic interstitial changes without pleural effusions or pleural focal consolidations. Scattered granulomata with bulky left hilar calcified lymph nodes. No pneumothorax.  Degenerative changes shoulders. Multiple EKG lines overlie the patient and may obscure subtle underlying pathology. Remote left posterior rib fractures. Apparent catheter projects in the included right humerus soft tissues.  IMPRESSION: Stable cardiomegaly and mild chronic interstitial changes without superimposed acute cardiopulmonary process.  Remote granulomatous changes.  Apparent catheter within the right humerus soft tissues, recommend clinical correlation.   Electronically Signed   By: Awilda Metro   On: 01/04/2013 06:13    Cardiac Studies:  ECG:  afib nonspecific ST/T wave changes    Telemetry:  NSR rate 60-70  Echo: 12/8 EF 60-65%  Normal tissue AVR mild  to moderate MR  Medications:   . allopurinol  100 mg Oral Daily  . amiodarone  200 mg Oral Daily  . amLODipine  5 mg Oral Daily  . aspirin  325 mg Oral Daily  . cefTRIAXone  2 g Intravenous Q24H  . furosemide  80 mg Oral Daily  . levothyroxine  25 mcg Oral QAC breakfast  . metoprolol tartrate  12.5 mg Oral Daily  . multivitamin with minerals  1 tablet Oral Daily  . omega-3 acid ethyl esters  1 g Oral Daily  . pantoprazole  40 mg Oral Daily  . potassium chloride  10 mEq Oral Daily  . sodium chloride  3 mL Intravenous Q12H       Assessment/Plan:  PAF:  IN NSR  Continue iv amiodarone d/c with PO 200 bid in am no anticoagulation AVR:  Normal by TEE 12/8  Continue Rx for SBE per ID  Has PIC line in place   Anemia:  guiac stools  recheck in am    Charlton Haws 01/05/2013, 7:30 AM

## 2013-01-05 NOTE — Progress Notes (Signed)
INITIAL NUTRITION ASSESSMENT  DOCUMENTATION CODES Per approved criteria  -Severe malnutrition in the context of acute illness or injury   INTERVENTION: No nutrition intervention at this time --- patient declined RD to follow for nutrition care plan  NUTRITION DIAGNOSIS: Increased nutrient needs related to ongoing infection therapy as evidenced by estimated nutrition needs  Goal: Pt to meet >/= 90% of their estimated nutrition needs  Monitor:  PO & supplemental intake, weight, labs, I/O's  Reason for Assessment: Malnutrition Screening Tool Report  79 y.o. male  Admitting Dx: palpitations, A-fibrillation   ASSESSMENT: Patient with PMH of DM, HTN, stroke, anemia and CKD; recently hospitalized for weakness & fatigue, found to have Streptococcus bovis bacteremia ---> discharged with PICC and Rocephin therapy for 6 weeks; re-admitted for A-fibrillation with rapid ventricular response.  Patient seen per Clinical Nutrition during previous hospital admission; with hx of poor PO intake & weight loss; wife reports patient's appetite is getting better; ate well this AM for breakfast; has had a 10% weight loss x 1 month; would benefit from continued nutrition supplements, however, patient declined.  Patient continues to meet criteria for severe malnutrition in the context of acute illness.  Height: Ht Readings from Last 1 Encounters:  12/30/12 5\' 10"  (1.778 m)    Weight: Wt Readings from Last 1 Encounters:  01/05/13 172 lb 11.2 oz (78.336 kg)    Ideal Body Weight: 166 lb  % Ideal Body Weight: 104%  Wt Readings from Last 10 Encounters:  01/05/13 172 lb 11.2 oz (78.336 kg)  12/31/12 179 lb 8 oz (81.421 kg)  12/30/12 177 lb 12.8 oz (80.65 kg)  12/17/12 190 lb 4.1 oz (86.3 kg)  12/17/12 190 lb 4.1 oz (86.3 kg)  12/12/12 189 lb 12.8 oz (86.093 kg)  12/08/12 192 lb 4.8 oz (87.227 kg)  12/01/12 196 lb 12.8 oz (89.268 kg)  11/11/12 196 lb (88.905 kg)  09/02/12 195 lb (88.451 kg)     Usual Body Weight: 192 lb -- December 2014  % Usual Body Weight: 90%  BMI:  Body mass index is 24.78 kg/(m^2).  Estimated Nutritional Needs: Kcal: 1900-2100 Protein: 95-105 gm Fluid: 1.9-2.1 L  Skin: Intact  Diet Order: Cardiac  EDUCATION NEEDS: -No education needs identified at this time   Intake/Output Summary (Last 24 hours) at 01/05/13 1339 Last data filed at 01/05/13 1304  Gross per 24 hour  Intake    600 ml  Output   1550 ml  Net   -950 ml    Labs:   Recent Labs Lab 01/04/13 0555 01/04/13 1829 01/05/13 0500  NA 142  --  138  K 4.2  --  3.9  CL 104  --  104  CO2  --   --  26  BUN 35*  --  37*  CREATININE 1.80*  --  1.81*  CALCIUM  --   --  8.4  MG  --  2.0  --   GLUCOSE 156*  --  132*    CBG (last 3)   Recent Labs  01/04/13 2135 01/05/13 0622 01/05/13 1117  GLUCAP 90 140* 130*    Scheduled Meds: . allopurinol  100 mg Oral Daily  . amiodarone  200 mg Oral Daily  . amLODipine  5 mg Oral Daily  . aspirin  325 mg Oral Daily  . cefTRIAXone  2 g Intravenous Q24H  . furosemide  80 mg Oral Daily  . levothyroxine  25 mcg Oral QAC breakfast  . metoprolol tartrate  12.5  mg Oral Daily  . multivitamin with minerals  1 tablet Oral Daily  . omega-3 acid ethyl esters  1 g Oral Daily  . pantoprazole  40 mg Oral Daily  . potassium chloride  10 mEq Oral Daily  . sodium chloride  3 mL Intravenous Q12H    Continuous Infusions:   Past Medical History  Diagnosis Date  . Stroke   . Atrial fibrillation     a. amiodarone therapy; not felt to be a candidate for anticoagulation - bruises easily.  . Bradycardia     a. only tolerates metoprolol 12.5mg  daily (bradycardic w/ bid dosing).  . Aortic stenosis     a. Previously severe -> s/p minimally invasive tissue AVR with Dr. Silvestre Mesi at [facility] 01/2011 (pre-AVR cath with no obs CAD);  b. 12/2012 TEE: EF 60-65%, no veg, mild AS, triv AI.  [name] Kitchen Hypertension   . Diabetes mellitus   . Anemia   . Action tremor   .  Thrombocytopenia     Dr. [name]  . Throat cancer     s/p resection  . Colon polyp   . Anxiety   . Splenomegaly   . Gout   . GERD (gastroesophageal reflux disease)   . Hypothyroidism   . Leukopenia     Chronic pancytopenia  . Degenerative disc disease   . Joint effusion, knee     left knee  . Synovial cyst of popliteal space   . Cellulitis of left leg 10/11-16/2012  . CKD (chronic kidney disease), stage III   . Chronic diastolic CHF (congestive heart failure)     a. 12/15/2012 TEE: EF 60-65%, no veg.  . Bacteremia     a. 12/2012 - S bovis;  b. 12/2012 TEE w/o veg;  c. Seeing ID->Rocephin therapy extended to 01/25/2013 via PICC for possible endocarditis (No veg on TEE).    Past Surgical History  Procedure Laterality Date  . Cholecystectomy    . Joint effusion      left knee  . Aortic valve replacement  01/23/2011    via minimally invasive approach per Dr Silvestre Mesi, [facility]  . Tee without cardioversion N/A 12/15/2012    Procedure: TRANSESOPHAGEAL ECHOCARDIOGRAM (TEE);  Surgeon: Lars Masson, MD;  Location: [facility] ENDOSCOPY;  Service: Cardiovascular;  Laterality: N/A;  . Hernia repair      Maureen Chatters, RD, LDN Pager #: [phone] After-Hours Pager #: [phone]

## 2013-01-06 DIAGNOSIS — I700 Atherosclerosis of aorta: Secondary | ICD-10-CM

## 2013-01-06 DIAGNOSIS — I480 Paroxysmal atrial fibrillation: Secondary | ICD-10-CM

## 2013-01-06 DIAGNOSIS — R600 Localized edema: Secondary | ICD-10-CM

## 2013-01-06 LAB — GLUCOSE, CAPILLARY: Glucose-Capillary: 146 mg/dL — ABNORMAL HIGH (ref 70–99)

## 2013-01-06 MED ORDER — SODIUM CHLORIDE 0.9 % IJ SOLN: 10.0000 mL | Freq: Two times a day (BID) | INTRAMUSCULAR | Status: DC

## 2013-01-06 MED ORDER — HEPARIN SOD (PORK) LOCK FLUSH 100 UNIT/ML IV SOLN
250.0000 [IU] | INTRAVENOUS | Status: DC | PRN
  Administered 2013-01-06: 250 [IU]
  Filled 2013-01-06: qty 3

## 2013-01-06 MED ORDER — HEPARIN SOD (PORK) LOCK FLUSH 100 UNIT/ML IV SOLN
250.0000 [IU] | Freq: Every day | INTRAVENOUS | Status: DC
  Filled 2013-01-06: qty 3

## 2013-01-06 MED ORDER — SODIUM CHLORIDE 0.9 % IJ SOLN
10.0000 mL | INTRAMUSCULAR | Status: DC | PRN
  Administered 2013-01-06: 10 mL

## 2013-01-06 MED ORDER — AMIODARONE HCL 200 MG PO TABS: 200.0000 mg | ORAL_TABLET | Freq: Two times a day (BID) | ORAL | Status: DC

## 2013-01-06 NOTE — Discharge Summary (Signed)
Discharge Summary   Patient ID: [name],  MRN: [id number], DOB/AGE: 1922-07-30 79 y.o.  Admit date: 01/04/2013 Discharge date: 01/06/2013  Primary Physician: [name], MD Primary Cardiologist: Judie Petit. Excell Seltzer, MD  Discharge Diagnoses Principal Problem:   PAF (paroxysmal atrial fibrillation)  - Converted on amiodarone 150mg  IV x 1  - PO amiodarone up-titrated to 200mg  PO BID  - No anticoagulation d/t pancytopenia Active Problems:   Other pancytopenia  - H/H drop from 10.9/32.0->8.4/26.0 on 12/28 to 12/29. Stable this AM.   - Follow-up hem/onc   Sinus bradycardia  - BB held (HR 30-40s in NSR)   DIABETES MELLITUS, CONTROLLED   GOUT   CAD, NATIVE VESSEL   CVA   GERD   Hypothyroidism   S/P AVR   Protein-calorie malnutrition, severe  - Follow-up PCP, refused nutritionist consult this admission. Recommend protein supplementation.    Aortic atherosclerosis  - Noted on 12/28 CXR. Continue to monitor.   Lower extremity edema  - Consider holding trial of Norvasc  - Diuresed 1275 with one dose of Lasix IV inpatient   CKD, stage III   Bacteremia  Allergies Allergies  Allergen Reactions  . Penicillins Other (See Comments)    Does not remember reaction (~year 1950)  . Neomycin-Bacitracin Zn-Polymyx     ? Reaction (thinks he remembers redness)    Diagnostic Studies/Procedures  PORTABLE CHEST X-RAY - 01/04/13  IMPRESSION:  Stable cardiomegaly and mild chronic interstitial changes without superimposed acute cardiopulmonary process. Remote granulomatous changes. Apparent catheter within the right humerus soft tissues, recommend clinical correlation.  History of Present Illness [name] is a 79 y.o. male who was admitted to [facility] hospital 12/28 to 12/30 with the above problem list.   He has an extensive past medical history outlined in the PMHx section of his chart. This includes h/o AS s/p bioprosthetic AVR, PAF (on amiodarone, not an anticoagulation  candidate 2/2 pancytopenia), h/o bradycardia, HFpEF, CAD, h/o CVA, DM2, HTN and CKD (stage III).   He was recently treated for bacteremia of unclear source. He has been on home IV rocephine therapy via PICC. TEE earlier this month indicated no vegetation. He was discharged 12/26 and had been feeling well until the morning of 12/28 when he began experiencing irregular tachy-palpitations c/w with prior episodes of a-fib. He did have some intermittent chest "squeezing" and cold extremities. He thus presented to the ED where telemetry and EKG confirmed atrial fibrillatoin with RVR. He was administered diltiazem IV with improvement of HR to 80-90s.   He was found to be volume overloaded on exam. He has a history of lower extremity edema attributed to chronic systolic CHF. The decision was made to admit the patient for management of atrial fibrillation and lower extremity edema.   Hospital Course   He was given a loaded dose of amiodarone IV (150mg ) and converted to SR shortly thereafter-- within 48 hour window so no TEE performed. He was not heparinized given significant thrombocytopenia. Amiodarone was increased to 200mg  PO daily. Metoprolol was resumed. He had an episode of bradycardia- HR 30-40s- and this was held. He maintained NSR on the adjusted amiodarone regimen.  He was also given a dose of Lasix 40mg  IV for volume overload. UOP - 1275 mL. Renal function remained stable with this.   Rocephine was continued upon admission.   Of note, the patient did have a drop in H/H as noted above. FOBT was negative for occult bleeding. Hgb/hct were stable between yesterday and today.  He was evaluated by Dr. Eden Emms this morning who deemed the patient to be stable for discharge. He will be discharged on a up-titrated regimen of amiodarone at 200mg  PO BID. Metoprolol has been held due to bradycardia. This can be re-addressed on follow-up. Additionally, the patient takes Norvasc at home which may contribute to  lower extremity edema. Anemia can also cause LE edema. Unclear that this represents primary acute on chronic diastolic CHF. Continued Norvasc use will be re-visited with his primary cardiologist, and he has been advised to follow-up hem/onc regarding pancytopenia. He will resume outpatient home health services including antibiotic administration via PICC line. This information has been clearly outlined in the discharge AVS.   Discharge Vitals:  Blood pressure 128/59, pulse 61, temperature 97.5 F (36.4 C), temperature source Oral, resp. rate 18, weight 78.7 kg (173 lb 8 oz), SpO2 100.00%.   Weight change: -1.4 kg (-3 lb 1.4 oz)  Labs: Recent Labs     01/04/13  0547   01/05/13  0500  01/06/13  0447  WBC  2.8*   --   1.5*   --   HGB  10.1*   < >  8.4*  8.5*  HCT  31.2*   < >  26.0*  26.8*  MCV  88.1   --   88.1   --   PLT  42*   --   33*   --    < > = values in this interval not displayed.   Recent Labs Lab 01/04/13 0555 01/05/13 0500  NA 142 138  K 4.2 3.9  CL 104 104  CO2  --  26  BUN 35* 37*  CREATININE 1.80* 1.81*  CALCIUM  --  8.4  GLUCOSE 156* 132*   Recent Labs     01/04/13  1829  01/05/13  0521  TROPONINI  <0.30  <0.30   Disposition:  Discharge Orders   Future Appointments Provider Department Dept Phone   01/16/2013 11:30 AM Tonny Bollman, MD [facility] [place] Office [phone]   01/27/2013 1:30 PM Hilarie Fredrickson, MD [facility] [phone]   03/02/2013 3:15 PM [facility] [phone]   03/02/2013 3:45 PM Ladene Artist, MD [facility] MEDICAL ONCOLOGY [phone]   03/17/2013 9:15 AM Tonny Bollman, MD [facility] [phone]   Future Orders Complete By Expires   Diet - low sodium heart healthy  As directed    Increase activity slowly  As directed      Follow-up Information   Follow up with Tonny Bollman, MD On 01/16/2013. (At 7:30 AM for  post-hospital follow-up. )    Specialty:  Cardiology   Contact information:   [address] [place] [address] [phone]       Follow up with [name], MD. Schedule an appointment as soon as possible for a visit in 1 week. (For general post-hospital follow-up. )    Specialty:  Internal Medicine   Contact information:   [phone] [facility] [address] [place] [address] [phone]       Schedule an appointment as soon as possible for a visit with Thornton Papas, MD. (Please follow-up for continued monitoring of low blood and platelet counts. )    Specialty:  Oncology   Contact information:   [address] [place] [address] [phone]      Discharge Medications:    Medication List  STOP taking these medications       metoprolol tartrate 25 MG tablet  Commonly known as:  LOPRESSOR      TAKE these medications       allopurinol 100 MG tablet  Commonly known as:  ZYLOPRIM  Take 1 tablet (100 mg total) by mouth daily.     amiodarone 200 MG tablet  Commonly known as:  PACERONE  Take 1 tablet (200 mg total) by mouth 2 (two) times daily.     amLODipine 5 MG tablet  Commonly known as:  NORVASC  Take 1 tablet (5 mg total) by mouth daily.     aspirin 325 MG tablet  Take 325 mg by mouth daily.     cefTRIAXone 2 G Solr injection  Commonly known as:  ROCEPHIN  Inject 2 g into the vein daily. Blood stream infection     colchicine 0.6 MG tablet  Take 0.6 mg by mouth as needed. Take one tablet by mouth daily during an acute episode of gout.     fish oil-omega-3 fatty acids 1000 MG capsule  Take 1 g by mouth daily.     Flax Seed Oil 1000 MG Caps  Take 1 capsule by mouth daily.     furosemide 40 MG tablet  Commonly known as:  LASIX  Take 2 tablets (80 mg total) by mouth daily.     levothyroxine 25 MCG tablet  Commonly known as:  LEVOTHROID  Take 1 tablet (25 mcg total) by mouth daily.     LORazepam 0.5 MG  tablet  Commonly known as:  ATIVAN  Take 0.5 mg by mouth every 6 (six) hours as needed for anxiety or sleep.     multivitamin tablet  Take 1 tablet by mouth daily.     omeprazole 20 MG capsule  Commonly known as:  PRILOSEC  Take 1 capsule (20 mg total) by mouth daily.     potassium chloride 10 MEQ tablet  Commonly known as:  K-DUR  Take 1 tablet (10 mEq total) by mouth daily.     vitamin C 500 MG tablet  Commonly known as:  ASCORBIC ACID  Take 1 tablet (500 mg total) by mouth daily.     Vitamin D 1000 UNITS capsule  Take 1 capsule (1,000 Units total) by mouth daily.       Outstanding Labs/Studies: CBC on follow-up  Duration of Discharge Encounter: Greater than 30 minutes including physician time.  Signed, R. Hurman Horn, PA-C 01/06/2013, 11:02 AM

## 2013-01-06 NOTE — Progress Notes (Signed)
Patient ID: [name], male   DOB: [birth year], 79 y.o.   MRN: [id number]    Subjective:  Denies SSCP, palpitations or Dyspnea Ready for d/c   Objective:  Filed Vitals:   01/05/13 1028 01/05/13 1405 01/05/13 1932 01/06/13 0532  BP:  104/40 100/52 128/59  Pulse: 56 87 59 61  Temp:  97.6 F (36.4 C) 98.2 F (36.8 C) 97.5 F (36.4 C)  TempSrc:  Oral Oral   Resp:  20 18   Weight:    173 lb 8 oz (78.7 kg)  SpO2:  99% 97% 100%    Intake/Output from previous day:  Intake/Output Summary (Last 24 hours) at 01/06/13 4098 Last data filed at 01/06/13 1191  Gross per 24 hour  Intake   1080 ml  Output   1875 ml  Net   -795 ml    Physical Exam: Affect appropriate Elderly white male  HEENT: normal Neck supple with no adenopathy JVP normal no bruits no thyromegaly Lungs clear with no wheezing and good diaphragmatic motion Heart:  S1/S2 SEM through tissue valve no AR  murmur, no rub, gallop or click PMI normal Abdomen: benighn, BS positve, no tenderness, no AAA no bruit.  No HSM or HJR Distal pulses intact with no bruits No edema Neuro non-focal Skin multiple echymosis on arms  PIC line RUE No muscular weakness   Lab Results: Basic Metabolic Panel:  Recent Labs  47/82/95 0555 01/04/13 1829 01/05/13 0500  NA 142  --  138  K 4.2  --  3.9  CL 104  --  104  CO2  --   --  26  GLUCOSE 156*  --  132*  BUN 35*  --  37*  CREATININE 1.80*  --  1.81*  CALCIUM  --   --  8.4  MG  --  2.0  --    Liver Function Tests: No results found for this basename: AST, ALT, ALKPHOS, BILITOT, PROT, ALBUMIN,  in the last 72 hours No results found for this basename: LIPASE, AMYLASE,  in the last 72 hours CBC:  Recent Labs  01/04/13 0547  01/05/13 0500 01/06/13 0447  WBC 2.8*  --  1.5*  --   HGB 10.1*  < > 8.4* 8.5*  HCT 31.2*  < > 26.0* 26.8*  MCV 88.1  --  88.1  --   PLT 42*  --  33*  --   < > = values in this interval not displayed. Cardiac Enzymes:  Recent Labs   01/04/13 1829 01/05/13 0521  TROPONINI <0.30 <0.30    Imaging: No results found.  Cardiac Studies:  ECG:  afib nonspecific ST/T wave changes    Telemetry:  NSR rate 60-70  Echo: 12/8 EF 60-65%  Normal tissue AVR mild to moderate MR  Medications:   . allopurinol  100 mg Oral Daily  . amiodarone  200 mg Oral Daily  . amLODipine  5 mg Oral Daily  . aspirin  325 mg Oral Daily  . cefTRIAXone  2 g Intravenous Q24H  . furosemide  80 mg Oral Daily  . levothyroxine  25 mcg Oral QAC breakfast  . metoprolol tartrate  12.5 mg Oral Daily  . multivitamin with minerals  1 tablet Oral Daily  . omega-3 acid ethyl esters  1 g Oral Daily  . pantoprazole  40 mg Oral Daily  . potassium chloride  10 mEq Oral Daily  . sodium chloride  3 mL Intravenous Q12H  Assessment/Plan:  PAF:  IN NSR   d/c with PO 200 bid in am no anticoagulation AVR:  Normal by TEE 12/8  Continue Rx for SBE per ID  Has PIC line in place   Anemia:  Stable 26.8 this am   Charlton Haws 01/06/2013, 7:38 AM

## 2013-01-06 NOTE — Care Management Note (Signed)
    Page 1 of 2   01/06/2013     3:19:43 PM   CARE MANAGEMENT NOTE 01/06/2013  Patient:  HEATON, SARIN   Account Number:  [id number]  Date Initiated:  01/06/2013  Documentation initiated by:  Sheldon Amara  Subjective/Objective Assessment:   PT ADM ON 12/28 WITH AFIB. PTA, PT INDEPENDENT, LIVES WITH SPOUSE.  PT IS ACTIVE WITH [facility] FOR HHRN, PT, AND OT. CURRENTLY HAS PICC LINE AND IS GETTING IV ABX AT HOME.     Action/Plan:   PT WILL NEED RESUMPTION OF CARE ORDERS FOR HH PRIOR TO DC.   Anticipated DC Date:  01/06/2013   Anticipated DC Plan:  HOME W HOME HEALTH SERVICES      DC Planning Services  CM consult      [facility] Choice  HOME HEALTH  Resumption Of Svcs/PTA Provider   Choice offered to / List presented to:  C-1 Patient        HH arranged  HH-1 RN  HH-2 PT  HH-3 OT      [facility] agency  [facility]   Status of service:  Completed, signed off Medicare Important Message given?   (If response is "NO", the following Medicare IM given date fields will be blank) Date Medicare IM given:   Date Additional Medicare IM given:    Discharge Disposition:  HOME W HOME HEALTH SERVICES  Per UR Regulation:  Reviewed for med. necessity/level of care/duration of stay  If discussed at Long Length of Stay Meetings, dates discussed:    Comments:  01/06/13 Rosalita Chessman [phone] PT FOR DC HOME TODAY.  NOTIFIED [facility] OF DC DATE AND RESUMPTION OF CARE ORDERS.

## 2013-01-07 ENCOUNTER — Telehealth: Payer: Self-pay | Admitting: Cardiovascular Disease

## 2013-01-07 NOTE — Telephone Encounter (Signed)
New problem     Pt has question about medication hydralazine.

## 2013-01-07 NOTE — Telephone Encounter (Signed)
Wife called stating she was trying to figure out if Hydralazine was stopped when he was in hospital.. States her daughter was there and they figured out that the medication was stopped on the 12/10 admission.  Understands all of his other medications. Advised the medications was not listed on current medication plan.

## 2013-01-09 NOTE — Telephone Encounter (Signed)
01/09/2013 Copy sent to batch, copy sent to billing.  bw

## 2013-01-11 ENCOUNTER — Telehealth: Payer: Self-pay | Admitting: Physician Assistant

## 2013-01-11 NOTE — Telephone Encounter (Signed)
[name] is an 80 y/o M with history of PAF. He and his wife called Sunday morning. He woke up this morning, got dressed and went back into AF. HR is somewhat over 100s. He doesn't currently feel bad but does feel different. Mild chest discomfort sensation but no overt pain. No SOB. During last admission for AF, he required discontinuation of beta blocker due to bradycardia with HR in the 30s-40s. He states yesterday HR was in the 50s thus I do not feel comfortable resuming home metoprolol. He just took his amiodarone about 15 mins ago. I gave them the option of heading to ER for evaluation if he decides he is feeling poorly given vague symptoms, age, and limitation of telephone care. He and his wife did not want to start with that. I told them the other option (if he currently feels OK) is to give the amiodarone more time to kick in this AM, and if still feeling uncomfortable, to proceed to ER. His wife asked if we had an emergency doctor in the office to see. I explained that the office is closed and we continue to see patients in the ER over the weekend. They elected option #2 and verbalized understanding and gratitude. Johnattan Strassman PA-C

## 2013-01-12 NOTE — Telephone Encounter (Signed)
Spoke with patient. He feels like heart is 'back in normal rhythm now.' He has appt with me in the am and will reassess then. thx

## 2013-01-13 ENCOUNTER — Encounter: Payer: Self-pay | Admitting: Cardiovascular Disease

## 2013-01-13 ENCOUNTER — Ambulatory Visit (INDEPENDENT_AMBULATORY_CARE_PROVIDER_SITE_OTHER): Payer: Medicare Other | Admitting: Cardiovascular Disease

## 2013-01-13 VITALS — BP 116/62 | HR 59 | Ht 70.0 in | Wt 182.0 lb

## 2013-01-13 DIAGNOSIS — I2510 Atherosclerotic heart disease of native coronary artery without angina pectoris: Secondary | ICD-10-CM

## 2013-01-13 DIAGNOSIS — I4891 Unspecified atrial fibrillation: Secondary | ICD-10-CM

## 2013-01-13 DIAGNOSIS — I359 Nonrheumatic aortic valve disorder, unspecified: Secondary | ICD-10-CM

## 2013-01-13 MED ORDER — METOPROLOL TARTRATE 25 MG PO TABS: 25.0000 mg | ORAL_TABLET | ORAL | Status: DC | PRN

## 2013-01-13 NOTE — Progress Notes (Signed)
HPI:  Medically complex 80 year old gentleman presenting for followup evaluation. The patient was hospitalized from 12/28 to 01/06/2013 with atrial fibrillation. His past medical history includes aortic stenosis status post bioprosthetic aortic valve replacement, paroxysmal atrial fibrillation with history of bradycardia, diastolic heart failure, coronary artery disease, diabetes, and stroke. He has not been anticoagulated in the setting of advanced age, recurrent falls, and pancytopenia (platelets less than 50,000). He recently had bacteremia of unclear source and was treated with an extended course of IV antibiotics. A TEE demonstrated no evidence of bacterial endocarditis.   The patient had another episode of atrial fibrillation about 48 hours ago. His resting heart rate in sinus rhythm is generally in the 50s. He noticed that his heart beat was irregular and there was associated weakness and fatigue. He did not have shortness of breath or chest pain. His heart rate was in the 90s. He took an extra dose of oral amiodarone and symptoms resolved within a few hours. He's had no recurrence since that time. He continues to feel generally fatigued. Leg swelling has been stable. He denies orthopnea, PND, fevers, chills, or lightheadedness.  His beta blocker has been discontinued because of resting bradycardia.    Outpatient Encounter Prescriptions as of 01/13/2013  Medication Sig  . allopurinol (ZYLOPRIM) 100 MG tablet Take 1 tablet (100 mg total) by mouth daily.  [name] Kitchen amiodarone (PACERONE) 200 MG tablet Take 1 tablet (200 mg total) by mouth 2 (two) times daily.  [name] Kitchen amLODipine (NORVASC) 5 MG tablet Take 1 tablet (5 mg total) by mouth daily.  [name] Kitchen aspirin 325 MG tablet Take 325 mg by mouth daily.    . cefTRIAXone (ROCEPHIN) 2 G SOLR injection Inject 2 g into the vein daily. Blood stream infection  . Cholecalciferol (VITAMIN D) 1000 UNITS capsule Take 1 capsule (1,000 Units total) by mouth daily.  .  colchicine 0.6 MG tablet Take 0.6 mg by mouth as needed. Take one tablet by mouth daily during an acute episode of gout.  . fish oil-omega-3 fatty acids 1000 MG capsule Take 1 g by mouth daily.    . Flaxseed, Linseed, (FLAX SEED OIL) 1000 MG CAPS Take 1 capsule by mouth daily.   . furosemide (LASIX) 40 MG tablet Take 2 tablets (80 mg total) by mouth daily.  [name] Kitchen levothyroxine (LEVOTHROID) 25 MCG tablet Take 1 tablet (25 mcg total) by mouth daily.  [name] Kitchen LORazepam (ATIVAN) 0.5 MG tablet Take 0.5 mg by mouth every 6 (six) hours as needed for anxiety or sleep.  . Multiple Vitamin (MULTIVITAMIN) tablet Take 1 tablet by mouth daily.    [name] Kitchen omeprazole (PRILOSEC) 20 MG capsule Take 1 capsule (20 mg total) by mouth daily.  . potassium chloride (K-DUR) 10 MEQ tablet Take 1 tablet (10 mEq total) by mouth daily.  . vitamin C (ASCORBIC ACID) 500 MG tablet Take 1 tablet (500 mg total) by mouth daily.    Allergies  Allergen Reactions  . Penicillins Other (See Comments)    Does not remember reaction (~year 1950)  . Neomycin-Bacitracin Zn-Polymyx     ? Reaction (thinks he remembers redness)    Past Medical History  Diagnosis Date  . Stroke   . Atrial fibrillation     a. amiodarone therapy; not felt to be a candidate for anticoagulation - bruises easily.  . Bradycardia     a. only tolerates metoprolol 12.5mg  daily (bradycardic w/ bid dosing).  . Aortic stenosis     a. Previously severe -> s/p minimally invasive  tissue AVR with Dr. Evelina Dun at [facility] 01/2011 (pre-AVR cath with no obs CAD);  b. 12/2012 TEE: EF 60-65%, no veg, mild AS, triv AI.  [name] Kitchen Hypertension   . Anemia   . Action tremor   . Thrombocytopenia     Dr. Benay Spice  . Colon polyp   . Anxiety   . Splenomegaly   . Gout   . GERD (gastroesophageal reflux disease)   . Hypothyroidism   . Leukopenia     Chronic pancytopenia  . Degenerative disc disease   . Joint effusion, knee     left knee  . Synovial cyst of popliteal space   . Cellulitis of left  leg 10/11-16/2012  . CKD (chronic kidney disease), stage III   . Chronic diastolic CHF (congestive heart failure)     a. 12/15/2012 TEE: EF 60-65%, no veg.  . Bacteremia     a. 12/2012 - S bovis;  b. 12/2012 TEE w/o veg;  c. Seeing ID->Rocephin therapy extended to 01/25/2013 via PICC for possible endocarditis (No veg on TEE).  . High cholesterol   . Heart murmur   . Diabetes mellitus     "borderline" (01/05/2013)  . History of blood transfusion 01/2011; 11/2012    "related to heart valve replaced; low HgB count" (01/05/2013)  . H/O hiatal hernia   . Throat cancer     s/p lasered   ROS: Negative except as per HPI  BP 116/62  Pulse 59  Ht 5\' 10"  (1.778 m)  Wt 182 lb (82.555 kg)  BMI 26.11 kg/m2  SpO2 98%  PHYSICAL EXAM: Pt is alert and oriented, pleasant elderly male in NAD HEENT: normal Neck: JVP - normal, carotids 2+= with bilateral bruits Lungs: CTA bilaterally CV: RRR with grade 2/6 harsh systolic murmur at the left sternal border Abd: soft, NT, Positive BS, no hepatomegaly Ext: 1+ pretibial edema bilaterally, distal pulses intact and equal Skin: warm/dry no rash  ASSESSMENT AND PLAN: 1. Paroxysmal atrial fibrillation. The patient is treated with a rhythm control strategy using amiodarone. He is highly symptomatic with atrial fibrillation. His amiodarone dose has been increased to 200 mg twice daily and I think this should be continued at the present time. I will see him back in 6 weeks for followup. If he remains stable at that time, will consider reducing his dose back down to 200 mg. I think he needs to be of maintenance beta blocker because of marked bradycardia with that. However, advised that he could use metoprolol 25 mg as needed when he has symptomatic palpitations and/or heart rates greater than 80 beats per minute at rest. The patient is not felt to be a candidate for anticoagulation as detailed above.  2. Aortic valve disease status post bioprosthetic aortic valve  replacement. A recent echocardiogram has been done with stable findings of mild prosthetic aortic stenosis.  3. Strep bovis bacteremia: He continues on IV antibiotics. I examined his PICC line site today and it was stable with no evidence of infection.  4. Stage III chronic kidney disease. Appears stable and he remains on furosemide 80 mg daily.  [name] 01/13/2013 2:31 PM

## 2013-01-13 NOTE — Patient Instructions (Signed)
Your physician has recommended you make the following change in your medication:  Take an extra Metoprolol 25 mg as needed for irregular heart rate if heart rate is greater than 80 Try Tylenol PM as needed to help you sleep  Your physician recommends that you return for lab work in: 6 weeks - before next appointment on 2/20 (TSH, T4, BMET, Liver panel)  Your physician recommends that you schedule a follow-up appointment in: Friday, February 20 at 3:30 pm

## 2013-01-15 ENCOUNTER — Ambulatory Visit (INDEPENDENT_AMBULATORY_CARE_PROVIDER_SITE_OTHER): Payer: Medicare Other | Admitting: Internal Medicine

## 2013-01-15 DIAGNOSIS — G250 Essential tremor: Secondary | ICD-10-CM

## 2013-01-15 DIAGNOSIS — I480 Paroxysmal atrial fibrillation: Secondary | ICD-10-CM

## 2013-01-15 DIAGNOSIS — R7881 Bacteremia: Secondary | ICD-10-CM

## 2013-01-15 DIAGNOSIS — G252 Other specified forms of tremor: Principal | ICD-10-CM

## 2013-01-15 DIAGNOSIS — I4891 Unspecified atrial fibrillation: Secondary | ICD-10-CM

## 2013-01-15 MED ORDER — PROPRANOLOL HCL 10 MG PO TABS: ORAL_TABLET | ORAL | Status: DC

## 2013-01-15 NOTE — Progress Notes (Signed)
   Subjective:    Patient ID: [name], male    DOB: 06-24-1922, 80 y.o.   MRN: [id number]  HPI  His complicated hospitalizations 12/5-12/10/14 and 12/28-12/30/14 were reviewed. He continues Rocephin IV through a PICC line for Strep bovis bacteremia; he may have additional doses until ? 01/26/13.  The blood cultures revealed strep bovis; special stool cultures are being processed @ a subspecialty lab in [place]. Echo imaging did not reveal any vegetations on the valves.   Review of Systems  At this time he denies any signs of active infection such as nasal purulence, purulent sputum, diarrhea, pustules, dysuria, or pyuria.  His major concern is a coarse action tremor which involves the left upper extremity greater than the right. This is actually long-standing. He believes it began after neck hyperextension for an unknown period time in 1944. It has seemingly progressed since September  He's been on a selective beta blocker as needed for paroxysmal atrial fibrillation. He believes medicines he took for shingles in ? 2010 may have made his tremor better.       Objective:   Physical Exam Gen.:adequately nourished in appearance. Alert, appropriate and cooperative throughout exam.Appears younger than stated age  Head: Normocephalic without obvious abnormalities;pattern alopecia  Eyes: No corneal or conjunctival inflammation noted. No icterus Nose: External nasal exam reveals no deformity or inflammation. Nasal mucosa are pink and moist. No lesions or exudates noted.  Severe hearing deficit despite aids Mouth: Oral mucosa and oropharynx reveal no lesions or exudates. Upper plate; lower teeth in good repair. Neck: No deformities, masses, or tenderness noted.  Thyroid normal Lungs: Normal respiratory effort; chest expands symmetrically. Lungs are clear to auscultation without rales, wheezes, or increased work of breathing. Heart: Normal rate and rhythm. Normal S1 and S2. No gallop, click,  or rub. Grade 2.9-9 /6 systolic murmur. Abdomen: Bowel sounds normal; abdomen soft and nontender. No masses, organomegaly or hernias noted.                                  Musculoskeletal/extremities:  No clubbing or cyanosis. Tense ankle  edema .Tone & strength normal. Hand joints normal  Fingernail  health good. Able to lie down & sit up w/o help. Negative SLR bilaterally Vascular: Carotid, radial artery, dorsalis pedis and  posterior tibial pulses are full and equal. ?faint L carotid  bruit present. Neurologic: Alert and oriented x3. Deep tendon reflexes symmetrical but 0-1/2+        Skin:Marked hyperpigmentation of forearms. No evidence of cellulitis at the site of the PICC line Lymph: No cervical, axillary lymphadenopathy present. Psych: Mood and affect are normal. Normally interactive                                                                                        Assessment & Plan:  See Current Assessment & Plan in Problem List under specific Diagnosis

## 2013-01-15 NOTE — Progress Notes (Signed)
Pre visit review using our clinic review tool, if applicable. No additional management support is needed unless otherwise documented below in the visit note. 

## 2013-01-15 NOTE — Assessment & Plan Note (Addendum)
  Non selective Beta blocker propranolol could be tried To avoid stimulants such as decongestants, diet pills, nicotine, or caffeine (coffee, tea, cola, or chocolate) to excess. Referral to Dr Tat if no better

## 2013-01-15 NOTE — Assessment & Plan Note (Signed)
F/U with Dr Henrene Pastor, GI

## 2013-01-15 NOTE — Patient Instructions (Signed)
Avoid stimulants :decongestants, diet pills, nicotine, caffeine( coffee, tea,cola, chocolate) to excess to prevent  Exacerbation of tremor

## 2013-01-16 ENCOUNTER — Ambulatory Visit: Payer: Medicare Other | Admitting: Cardiovascular Disease

## 2013-01-16 ENCOUNTER — Encounter: Payer: Self-pay | Admitting: Internal Medicine

## 2013-01-19 ENCOUNTER — Other Ambulatory Visit: Payer: Self-pay

## 2013-01-19 MED ORDER — OMEPRAZOLE 20 MG PO CPDR: 20.0000 mg | DELAYED_RELEASE_CAPSULE | Freq: Every day | ORAL | Status: DC

## 2013-01-20 ENCOUNTER — Telehealth: Payer: Self-pay | Admitting: Cardiovascular Disease

## 2013-01-20 NOTE — Telephone Encounter (Signed)
PT  CURRENTLY  TAKING  PACERONE  200 MG  BID  WAS  RECENTLY  INCREASED  FROM  100 MG  BID  PER  PT   HEART  RATE  HAS  BEEN  RUNNING  IN THE  LOW  HIGH 40 'S TO  TO  LOW  50'S  FOR WEEK   NO SYMPTOMS  WILL FORWARD TO DR Burt Knack  FOR  RECOMMENDATIONS   PT'S  HEART  RATE THIS  AM  WAS  BEFORE TAKING   PACERONE  .Adonis Housekeeper

## 2013-01-20 NOTE — Telephone Encounter (Signed)
New Problem:  Pt states he was seen in the hospital recently and they increased his pacerone. Pt states now is heart rate has been down in the 40s... Pt states it was 47 this morning. Pt would like a call back from a nurse.

## 2013-01-21 NOTE — Telephone Encounter (Signed)
Would go to amiodarone 200 mg once daily

## 2013-01-22 ENCOUNTER — Telehealth: Payer: Self-pay | Admitting: *Deleted

## 2013-01-22 ENCOUNTER — Telehealth: Payer: Self-pay | Admitting: Physician Assistant

## 2013-01-22 NOTE — Telephone Encounter (Signed)
VM from patient reporting he was recently in hospital and upon discharge was told to make an appointment with Dr. Benay Spice as soon as possible. Upon investigation, he was discharged on 12/28 and d/c instruction did note to make appointment with Dr. Benay Spice to follow up on low blood counts-Hgb was 8.4 and platelet count was 33,000.  Forwarded message to MD.

## 2013-01-22 NOTE — Telephone Encounter (Signed)
Left message for patient to call office to discuss medication change. 

## 2013-01-22 NOTE — Telephone Encounter (Signed)
Spoke with patient and advised him to take Amiodarone 200 mg once daily per Dr. Burt Knack.  Patient verbalized understanding and agreement.  I advised patient to call back with worsening symptoms or concerns.  Patient verbalized agreement.

## 2013-01-23 ENCOUNTER — Telehealth: Payer: Self-pay | Admitting: *Deleted

## 2013-01-23 NOTE — Telephone Encounter (Signed)
CBC results from Dr. Clayborn Heron office reviewed by Dr. Benay Spice. PLT 46. HGB 9.1. Per Dr. Benay Spice: Counts look OK. Call office for dyspnea or bleeding. Follow up as scheduled or next 1-2 weeks with NP if pt would rather be seen sooner. Pt reports he feels fine. Does not feel like he needs to be seen sooner. Instructed pt to call office if he develops dyspnea or bleeding. He voiced understanding.

## 2013-01-26 ENCOUNTER — Other Ambulatory Visit: Payer: Self-pay

## 2013-01-26 NOTE — Telephone Encounter (Signed)
I called and told the patient I am expecting faxed results today. I called [name] said she will fax them to [phone].  [name] said he has an appointment with Dr. Henrene Pastor tomorrow on 01-27-2013.  I told him when we get the results I will see that Dr. Henrene Pastor gets the results.

## 2013-01-27 ENCOUNTER — Encounter: Payer: Self-pay | Admitting: Internal Medicine

## 2013-01-27 ENCOUNTER — Ambulatory Visit (INDEPENDENT_AMBULATORY_CARE_PROVIDER_SITE_OTHER): Payer: Medicare Other | Admitting: Internal Medicine

## 2013-01-27 VITALS — BP 126/54 | HR 60 | Ht 70.0 in | Wt 185.4 lb

## 2013-01-27 DIAGNOSIS — K922 Gastrointestinal hemorrhage, unspecified: Secondary | ICD-10-CM

## 2013-01-27 DIAGNOSIS — R195 Other fecal abnormalities: Secondary | ICD-10-CM

## 2013-01-27 DIAGNOSIS — R7881 Bacteremia: Secondary | ICD-10-CM

## 2013-01-27 DIAGNOSIS — Z860100 Personal history of colon polyps, unspecified: Secondary | ICD-10-CM

## 2013-01-27 DIAGNOSIS — K56609 Unspecified intestinal obstruction, unspecified as to partial versus complete obstruction: Secondary | ICD-10-CM

## 2013-01-27 NOTE — Progress Notes (Signed)
HISTORY OF PRESENT ILLNESS:  [name] is a 80 y.o. male with MULTIPLE SIGNIFICANT medical problems who presents today for office followup. I have seen the patient remotely for a history of adenomatous colon polyps and family history of colon cancer. Last colonoscopy 2003. Normal exam except for diminutive polyp and diverticulosis. He has chronic leukopenia and thrombocytopenia. Hospitalized with group B strep bacteremia 14 is completed long-term antibiotic therapy. Concern is raised by the hospital team over possible occult colon cancer given this pathogen. Seen by the advanced practitioner last month. Cologaurd ordered and returned positive. Patient here with his wife to discuss the implications of this finding and plans. Since his last visit, he is feeling better. He has gained weight and strength, though not back to baseline. He continues on multiple medications as listed. Last hemoglobin December 30 was 8.5. Abdominal ultrasound in December 2014 most significant for splenomegaly. No CT scan. Patient denies abdominal pain or GI bleeding. No change in bowel habits.  REVIEW OF SYSTEMS:  All non-GI ROS negative except for bruising, fatigue, ankle edema, exertional dyspnea  Past Medical History  Diagnosis Date  . Stroke   . Atrial fibrillation     a. amiodarone therapy; not felt to be a candidate for anticoagulation - bruises easily.  . Bradycardia     a. only tolerates metoprolol 12.5mg  daily (bradycardic w/ bid dosing).  . Aortic stenosis     a. Previously severe -> s/p minimally invasive tissue AVR with Dr. Evelina Dun at [facility] 01/2011 (pre-AVR cath with no obs CAD);  b. 12/2012 TEE: EF 60-65%, no veg, mild AS, triv AI.  [name] Kitchen Hypertension   . Anemia   . Action tremor   . Thrombocytopenia     Dr. Benay Spice  . Colon polyp   . Anxiety   . Splenomegaly   . Gout   . GERD (gastroesophageal reflux disease)   . Hypothyroidism   . Leukopenia     Chronic pancytopenia  . Degenerative disc disease    . Joint effusion, knee     left knee  . Synovial cyst of popliteal space   . Cellulitis of left leg 10/11-16/2012  . CKD (chronic kidney disease), stage III   . Chronic diastolic CHF (congestive heart failure)     a. 12/15/2012 TEE: EF 60-65%, no veg.  . Bacteremia     a. 12/2012 - S bovis;  b. 12/2012 TEE w/o veg;  c. Seeing ID->Rocephin therapy extended to 01/25/2013 via PICC for possible endocarditis (No veg on TEE).  . High cholesterol   . Heart murmur   . Diabetes mellitus     "borderline" (01/05/2013)  . History of blood transfusion 01/2011; 11/2012    "related to heart valve replaced; low HgB count" (01/05/2013)  . H/O hiatal hernia   . Throat cancer     s/p lasered  . Diverticulosis     Past Surgical History  Procedure Laterality Date  . Cholecystectomy    . Joint effusion      left knee  . Aortic valve replacement  01/23/2011    via minimally invasive approach per Dr Evelina Dun, [facility]  . Tee without cardioversion N/A 12/15/2012    Procedure: TRANSESOPHAGEAL ECHOCARDIOGRAM (TEE);  Surgeon: [name], MD;  Location: [facility] ENDOSCOPY;  Service: Cardiovascular;  Laterality: N/A;  . Cardiac valve replacement    . Inguinal hernia repair Right   . Excisional hemorrhoidectomy    . Cardiac catheterization    . Surgery scrotal / testicular      "  removed one" (01/05/2013)  . 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 His smoking use included Cigarettes. He has a 18 pack-year smoking history. He has never used smokeless tobacco. He reports that he does not drink alcohol or use illicit drugs.  family history includes Cancer in his other; Colon cancer in an other family member; Stroke in his other and another family member.  Allergies  Allergen Reactions  .  Penicillins Other (See Comments)    Does not remember reaction (~year 1950)  . Neomycin-Bacitracin Zn-Polymyx     ? Reaction (thinks he remembers redness)       PHYSICAL EXAMINATION: Vital signs: BP 126/54  Pulse 60  Ht 5\' 10"  (1.778 m)  Wt 185 lb 6.4 oz (84.097 kg)  BMI 26.60 kg/m2 General: Well-developed, well-nourished chronically ill-appearing, no acute distress HEENT: Sclerae are anicteric, conjunctiva pink. Oral mucosa intact Lungs: Clear Heart: Regular Abdomen: soft, nontender, nondistended, no obvious ascites, no peritoneal signs, normal bowel sounds. No organomegaly. Prior surgical incision well-healed Extremities: 2+ edema bilaterally. Ecchymoses present Psychiatric: alert and oriented x3. Cooperative   ASSESSMENT:  #1. Personal history of adenomatous colon polyps and family history of colon cancer. Last examination 2003 with minimal findings #2. Recent hospitalization with group D. streptococcal bacteremia. Slowly improving #3. Positive Cologaurd test. Question underlying neoplasia #4. Multiple medical problems. Ongoing   PLAN:  #1. Long discussion today regarding the interpretation of the cologuard test. Discussed sensitivity and specificity lay terms. Discussed options including observation only, as he is feeling better without additional worrisome features clinically. Noninvasive colonic Examination such as virtual colonoscopy. Optical colonoscopy including its risks. Prospects of general surgery should significant tumor be identified. To this end, they have elected virtual colonoscopy as the next step. If a significant mass lesion is identified, we will review options. However, if no significant pathology identified, then expectant management.

## 2013-01-27 NOTE — Telephone Encounter (Signed)
Spoke to [name] and advised him yesterday 1-19 -2015 that we are expecting the results today by fax. I let him know we spoke to a representative from [employer] and the results are completed. I told the patient Dr. Henrene Pastor should have the results when he sees Dr. Henrene Pastor tomorrow. We did get the fax and the patient saw Dr. Scarlette Shorts today 01-27-2013. I provided the results to Dr. Scarlette Shorts.

## 2013-01-27 NOTE — Patient Instructions (Signed)
You have been scheduled for a virtual colonoscopy at [facility], [place]. , Phone # [phone] on 02/09/2013 at 8:40am.  Please arrive at 8:20 am.    You will need to go by the [place] office this week or next to pick up the prep they will need you to use prior to the test

## 2013-01-30 ENCOUNTER — Other Ambulatory Visit: Payer: Self-pay | Admitting: *Deleted

## 2013-01-30 DIAGNOSIS — M109 Gout, unspecified: Secondary | ICD-10-CM

## 2013-01-30 MED ORDER — ALLOPURINOL 100 MG PO TABS: 100.0000 mg | ORAL_TABLET | Freq: Every day | ORAL | Status: DC

## 2013-01-30 NOTE — Telephone Encounter (Signed)
Refill for allopurinol sent to [facility]

## 2013-02-09 ENCOUNTER — Ambulatory Visit
Admission: RE | Admit: 2013-02-09 | Discharge: 2013-02-09 | Disposition: A | Discharge status: Home / Self Care | Payer: Medicare Other | Source: Ambulatory Visit | Attending: Internal Medicine | Admitting: Internal Medicine

## 2013-02-09 DIAGNOSIS — K56609 Unspecified intestinal obstruction, unspecified as to partial versus complete obstruction: Secondary | ICD-10-CM

## 2013-02-09 DIAGNOSIS — K922 Gastrointestinal hemorrhage, unspecified: Secondary | ICD-10-CM

## 2013-02-12 ENCOUNTER — Encounter: Payer: Self-pay | Admitting: Internal Medicine

## 2013-02-27 ENCOUNTER — Telehealth: Payer: Self-pay | Admitting: Cardiology

## 2013-02-27 ENCOUNTER — Other Ambulatory Visit: Payer: Medicare Other

## 2013-02-27 ENCOUNTER — Other Ambulatory Visit (HOSPITAL_COMMUNITY): Payer: Self-pay | Admitting: Cardiovascular Disease

## 2013-02-27 ENCOUNTER — Ambulatory Visit (INDEPENDENT_AMBULATORY_CARE_PROVIDER_SITE_OTHER): Payer: Medicare Other | Admitting: Cardiovascular Disease

## 2013-02-27 ENCOUNTER — Encounter: Payer: Self-pay | Admitting: Cardiovascular Disease

## 2013-02-27 VITALS — BP 122/54 | HR 48 | Ht 70.0 in | Wt 186.0 lb

## 2013-02-27 DIAGNOSIS — I4891 Unspecified atrial fibrillation: Secondary | ICD-10-CM

## 2013-02-27 DIAGNOSIS — I359 Nonrheumatic aortic valve disorder, unspecified: Secondary | ICD-10-CM

## 2013-02-27 DIAGNOSIS — I2510 Atherosclerotic heart disease of native coronary artery without angina pectoris: Secondary | ICD-10-CM

## 2013-02-27 LAB — HEPATIC FUNCTION PANEL
ALT: 24 U/L (ref 0–53)
AST: 33 U/L (ref 0–37)
Albumin: 4.2 g/dL (ref 3.5–5.2)
Alkaline Phosphatase: 78 U/L (ref 39–117)
Bilirubin, Direct: 0.2 mg/dL (ref 0.0–0.3)
Indirect Bilirubin: 0.4 mg/dL (ref 0.2–1.2)
TOTAL PROTEIN: 6.4 g/dL (ref 6.0–8.3)
Total Bilirubin: 0.6 mg/dL (ref 0.2–1.2)

## 2013-02-27 LAB — BASIC METABOLIC PANEL
BUN: 38 mg/dL — AB (ref 6–23)
CALCIUM: 8.6 mg/dL (ref 8.4–10.5)
CO2: 24 mEq/L (ref 19–32)
Chloride: 103 mEq/L (ref 96–112)
Creat: 1.63 mg/dL — ABNORMAL HIGH (ref 0.50–1.35)
GLUCOSE: 101 mg/dL — AB (ref 70–99)
Potassium: 4.6 mEq/L (ref 3.5–5.3)
SODIUM: 137 meq/L (ref 135–145)

## 2013-02-27 LAB — TSH: TSH: 4.697 u[IU]/mL — AB (ref 0.350–4.500)

## 2013-02-27 LAB — T4, FREE: FREE T4: 0.95 ng/dL (ref 0.80–1.80)

## 2013-02-27 NOTE — Patient Instructions (Signed)
Your physician recommends that you schedule a follow-up appointment in:  4 months. --Scheduled for June 25, 2013 at 8:45

## 2013-02-27 NOTE — Progress Notes (Signed)
HPI:   80 year old gentleman presenting for followup evaluation. The patient was hospitalized from 12/28 to 01/06/2013 with atrial fibrillation. His past medical history includes aortic stenosis status post bioprosthetic aortic valve replacement, paroxysmal atrial fibrillation with history of bradycardia, diastolic heart failure, coronary artery disease, diabetes, and stroke. He has not been anticoagulated in the setting of advanced age, recurrent falls, and pancytopenia (platelets less than 50,000).   The patient is doing fairly well. He brought in peanut brittle today. He denies chest pain, orthopnea, or PND. Dyspnea and lower extremity edema are unchanged from baseline. No lightheadedness or presyncope.    Outpatient Encounter Prescriptions as of 02/27/2013  Medication Sig  . allopurinol (ZYLOPRIM) 100 MG tablet Take 1 tablet (100 mg total) by mouth daily.  [name] Kitchen amiodarone (PACERONE) 200 MG tablet Take 200 mg by mouth daily.  [name] Kitchen amLODipine (NORVASC) 5 MG tablet Take 1 tablet (5 mg total) by mouth daily.  [name] Kitchen aspirin 325 MG tablet Take 325 mg by mouth daily.    . cefTRIAXone (ROCEPHIN) 1 G injection   . Cholecalciferol (VITAMIN D) 1000 UNITS capsule Take 1 capsule (1,000 Units total) by mouth daily.  . colchicine 0.6 MG tablet Take 0.6 mg by mouth as needed. Take one tablet by mouth daily during an acute episode of gout.  . fish oil-omega-3 fatty acids 1000 MG capsule Take 1 g by mouth daily.    . Flaxseed, Linseed, (FLAX SEED OIL) 1000 MG CAPS Take 1 capsule by mouth daily.   . furosemide (LASIX) 40 MG tablet Take 2 tablets (80 mg total) by mouth daily.  [name] Kitchen levothyroxine (LEVOTHROID) 25 MCG tablet Take 1 tablet (25 mcg total) by mouth daily.  . Multiple Vitamin (MULTIVITAMIN) tablet Take 1 tablet by mouth daily.    [name] Kitchen omeprazole (PRILOSEC) 20 MG capsule Take 1 capsule (20 mg total) by mouth daily.  . potassium chloride (K-DUR) 10 MEQ tablet Take 1 tablet (10 mEq total) by mouth daily.  .  vitamin C (ASCORBIC ACID) 500 MG tablet Take 1 tablet (500 mg total) by mouth daily.  . [DISCONTINUED] amiodarone (PACERONE) 200 MG tablet Take 1 tablet (200 mg total) by mouth 2 (two) times daily.  . [DISCONTINUED] propranolol (INDERAL) 10 MG tablet     Allergies  Allergen Reactions  . Penicillins Other (See Comments)    Does not remember reaction (~year 1950)  . Neomycin-Bacitracin Zn-Polymyx     ? Reaction (thinks he remembers redness)    Past Medical History  Diagnosis Date  . Stroke   . Atrial fibrillation     a. amiodarone therapy; not felt to be a candidate for anticoagulation - bruises easily.  . Bradycardia     a. only tolerates metoprolol 12.5mg  daily (bradycardic w/ bid dosing).  . Aortic stenosis     a. Previously severe -> s/p minimally invasive tissue AVR with Dr. Evelina Dun at [facility] 01/2011 (pre-AVR cath with no obs CAD);  b. 12/2012 TEE: EF 60-65%, no veg, mild AS, triv AI.  [name] Kitchen Hypertension   . Anemia   . Action tremor   . Thrombocytopenia     Dr. Benay Spice  . Colon polyp   . Anxiety   . Splenomegaly   . Gout   . GERD (gastroesophageal reflux disease)   . Hypothyroidism   . Leukopenia     Chronic pancytopenia  . Degenerative disc disease   . Joint effusion, knee     left knee  . Synovial cyst of popliteal space   .  Cellulitis of left leg 10/11-16/2012  . CKD (chronic kidney disease), stage III   . Chronic diastolic CHF (congestive heart failure)     a. 12/15/2012 TEE: EF 60-65%, no veg.  . Bacteremia     a. 12/2012 - S bovis;  b. 12/2012 TEE w/o veg;  c. Seeing ID->Rocephin therapy extended to 01/25/2013 via PICC for possible endocarditis (No veg on TEE).  . High cholesterol   . Heart murmur   . Diabetes mellitus     "borderline" (01/05/2013)  . History of blood transfusion 01/2011; 11/2012    "related to heart valve replaced; low HgB count" (01/05/2013)  . H/O hiatal hernia   . Throat cancer     s/p lasered  . Diverticulosis     BP 122/54  Pulse 48  Ht  5\' 10"  (1.778 m)  Wt 186 lb (84.369 kg)  BMI 26.69 kg/m2  PHYSICAL EXAM: Pt is alert and oriented, elderly male in NAD HEENT: normal Neck: JVP - normal, carotids 2+= without bruits Lungs: CTA bilaterally CV: Bradycardic and regular without murmur or gallop Abd: soft, NT, Positive BS, no hepatomegaly Ext: no C/C/E, distal pulses intact and equal Skin: warm/dry no rash  EKG:  Sinus bradycardia 48 beats per minute, incomplete left bundle branch block, voltage criteria for LVH.  ASSESSMENT AND PLAN: 1. PAF: stable and maintaining sinus rhythm. Tolerating amiodarone at low dose. Will check TFT's and metabolic panel. Not a candidate for anticoagulation.   2. Aortic stenosis s/p AVR: stable valve function. No evidence of endocarditis on TEE when he recently had bacteremia (completed ABX).  3. HTN: controlled on current Rx.

## 2013-02-27 NOTE — Telephone Encounter (Signed)
I called pt's STAT lab ordered today. No change from his baseline.  Kerin Ransom PA-C 02/27/2013 7:43 PM

## 2013-03-02 ENCOUNTER — Ambulatory Visit (HOSPITAL_BASED_OUTPATIENT_CLINIC_OR_DEPARTMENT_OTHER): Payer: Medicare Other | Admitting: Oncology

## 2013-03-02 ENCOUNTER — Other Ambulatory Visit (HOSPITAL_BASED_OUTPATIENT_CLINIC_OR_DEPARTMENT_OTHER): Payer: Medicare Other

## 2013-03-02 ENCOUNTER — Telehealth: Payer: Self-pay | Admitting: Oncology

## 2013-03-02 VITALS — BP 134/46 | HR 52 | Temp 98.5°F | Resp 20 | Ht 70.0 in | Wt 188.2 lb

## 2013-03-02 DIAGNOSIS — D696 Thrombocytopenia, unspecified: Secondary | ICD-10-CM

## 2013-03-02 DIAGNOSIS — D61818 Other pancytopenia: Secondary | ICD-10-CM

## 2013-03-02 DIAGNOSIS — R5381 Other malaise: Secondary | ICD-10-CM

## 2013-03-02 DIAGNOSIS — I4891 Unspecified atrial fibrillation: Secondary | ICD-10-CM

## 2013-03-02 DIAGNOSIS — R001 Bradycardia, unspecified: Secondary | ICD-10-CM

## 2013-03-02 DIAGNOSIS — R5383 Other fatigue: Secondary | ICD-10-CM

## 2013-03-02 DIAGNOSIS — R161 Splenomegaly, not elsewhere classified: Secondary | ICD-10-CM

## 2013-03-02 DIAGNOSIS — D126 Benign neoplasm of colon, unspecified: Secondary | ICD-10-CM

## 2013-03-02 DIAGNOSIS — D649 Anemia, unspecified: Secondary | ICD-10-CM

## 2013-03-02 DIAGNOSIS — N289 Disorder of kidney and ureter, unspecified: Secondary | ICD-10-CM

## 2013-03-02 LAB — CBC WITH DIFFERENTIAL/PLATELET
BASO%: 0.4 % (ref 0.0–2.0)
Basophils Absolute: 0 10*3/uL (ref 0.0–0.1)
EOS%: 2 % (ref 0.0–7.0)
Eosinophils Absolute: 0 10*3/uL (ref 0.0–0.5)
HCT: 29.2 % — ABNORMAL LOW (ref 38.4–49.9)
HGB: 9.7 g/dL — ABNORMAL LOW (ref 13.0–17.1)
LYMPH#: 0.4 10*3/uL — AB (ref 0.9–3.3)
LYMPH%: 21.3 % (ref 14.0–49.0)
MCH: 29.8 pg (ref 27.2–33.4)
MCHC: 33.2 g/dL (ref 32.0–36.0)
MCV: 89.8 fL (ref 79.3–98.0)
MONO#: 0.2 10*3/uL (ref 0.1–0.9)
MONO%: 9 % (ref 0.0–14.0)
NEUT#: 1.2 10*3/uL — ABNORMAL LOW (ref 1.5–6.5)
NEUT%: 67.3 % (ref 39.0–75.0)
Platelets: 42 10*3/uL — ABNORMAL LOW (ref 140–400)
RBC: 3.25 10*6/uL — ABNORMAL LOW (ref 4.20–5.82)
RDW: 19.7 % — AB (ref 11.0–14.6)
WBC: 1.8 10*3/uL — ABNORMAL LOW (ref 4.0–10.3)

## 2013-03-02 NOTE — Telephone Encounter (Signed)
Gave pt appt for lab and MD for May 2015 °

## 2013-03-02 NOTE — Progress Notes (Signed)
   [facility]    OFFICE PROGRESS NOTE   INTERVAL HISTORY:   [name] returns as scheduled. He reports feeling "weak "today. However he feels much better compared to when he was diagnosed with streptococcus bacteremia. There was a concern for a possible occult colon cancer and the Cologuard test returned positive. He was referred for a virtual colonoscopy on 02/10/2013. Multiple polypoid areas were seen in the colon measuring 8 mm or less. The CT scan revealed stable splenomegaly and bilateral renal cortical thinning.  Objective:  Vital signs in last 24 hours:  Blood pressure 134/46, pulse 52, temperature 98.5 F (36.9 C), temperature source Oral, resp. rate 20, height 5\' 10"  (1.778 m), weight 188 lb 3.2 oz (85.367 kg).    HEENT: Neck without mass Lymphatics: No cervical, supraclavicular, axillary, or inguinal nodes Resp: Lungs clear bilaterally Cardio: Regular rate and rhythm, 2/6 systolic murmur GI: The spleen tip is palpable in the left subcostal region with mild associated tenderness, no Vascular: 1+ edema at the low leg bilaterally with support stockings in place   Lab Results:  Lab Results  Component Value Date   WBC 1.8* 03/02/2013   HGB 9.7* 03/02/2013   HCT 29.2* 03/02/2013   MCV 89.8 03/02/2013   PLT 42* 03/02/2013   NEUTROABS 1.2* 03/02/2013      Medications: I have reviewed the patient's current medications.  Assessment/Plan: 1. Chronic pancytopenia/splenomegaly-likely related to a chronic lymphoproliferative disorder,? Splenic lymphoma  2. History of severe aortic stenosis, status post aortic valve replacement surgery at [facility] on 01/23/2011  3. History of gout  4. Diabetes  5. Streptococcus bacteremia-TEE negative for endocarditis 6. Malaise-likely secondary to #5 , improved 7. Progressive anemia-? Secondary to bacteremia, status post a red cell transfusion 12/11/2012  8. Renal insufficiency 9. Paroxysmal atrial fibrillation 10. Colon  polyps noted on a virtual colonoscopy 02/10/2013    Disposition:  He has recovered from streptococcus bacteremia. No clear source for infection was identified, he most likely had endocarditis. He has persistent anemia, but this has partially improved. We will consider a trial of erythropoietin for symptomatic anemia. [name] will return for an office visit and CBC in 3 months. He has stable thrombocytopenia. The plan is to resume nplate if the platelet count falls or he has bleeding.  Betsy Coder, MD  03/02/2013  4:02 PM

## 2013-03-04 ENCOUNTER — Telehealth: Payer: Self-pay | Admitting: Cardiovascular Disease

## 2013-03-04 NOTE — Telephone Encounter (Signed)
Patient is returning your call. Please call back.  °

## 2013-03-04 NOTE — Telephone Encounter (Signed)
Spoke with patient and reviewed lab results and plan of care.  Per Dr. Burt Knack, patient scheduled for repeat TSH on 6/18 prior to office visit with Dr. Burt Knack.

## 2013-03-08 ENCOUNTER — Encounter (HOSPITAL_COMMUNITY): Payer: Self-pay | Admitting: Emergency Medicine

## 2013-03-08 ENCOUNTER — Emergency Department (HOSPITAL_COMMUNITY)
Admission: EM | Admit: 2013-03-08 | Discharge: 2013-03-08 | Disposition: A | Discharge status: Home / Self Care | Payer: Medicare Other | Attending: Emergency Medicine | Admitting: Emergency Medicine

## 2013-03-08 ENCOUNTER — Emergency Department (HOSPITAL_COMMUNITY): Payer: Medicare Other

## 2013-03-08 DIAGNOSIS — S99929A Unspecified injury of unspecified foot, initial encounter: Secondary | ICD-10-CM

## 2013-03-08 DIAGNOSIS — S298XXA Other specified injuries of thorax, initial encounter: Secondary | ICD-10-CM | POA: Insufficient documentation

## 2013-03-08 DIAGNOSIS — Z872 Personal history of diseases of the skin and subcutaneous tissue: Secondary | ICD-10-CM | POA: Insufficient documentation

## 2013-03-08 DIAGNOSIS — W010XXA Fall on same level from slipping, tripping and stumbling without subsequent striking against object, initial encounter: Secondary | ICD-10-CM | POA: Insufficient documentation

## 2013-03-08 DIAGNOSIS — Z8659 Personal history of other mental and behavioral disorders: Secondary | ICD-10-CM | POA: Insufficient documentation

## 2013-03-08 DIAGNOSIS — IMO0002 Reserved for concepts with insufficient information to code with codable children: Secondary | ICD-10-CM | POA: Insufficient documentation

## 2013-03-08 DIAGNOSIS — Z860100 Personal history of colon polyps, unspecified: Secondary | ICD-10-CM | POA: Insufficient documentation

## 2013-03-08 DIAGNOSIS — I4891 Unspecified atrial fibrillation: Secondary | ICD-10-CM | POA: Insufficient documentation

## 2013-03-08 DIAGNOSIS — S99919A Unspecified injury of unspecified ankle, initial encounter: Secondary | ICD-10-CM

## 2013-03-08 DIAGNOSIS — K219 Gastro-esophageal reflux disease without esophagitis: Secondary | ICD-10-CM | POA: Insufficient documentation

## 2013-03-08 DIAGNOSIS — S8990XA Unspecified injury of unspecified lower leg, initial encounter: Secondary | ICD-10-CM | POA: Insufficient documentation

## 2013-03-08 DIAGNOSIS — R011 Cardiac murmur, unspecified: Secondary | ICD-10-CM | POA: Insufficient documentation

## 2013-03-08 DIAGNOSIS — I129 Hypertensive chronic kidney disease with stage 1 through stage 4 chronic kidney disease, or unspecified chronic kidney disease: Secondary | ICD-10-CM | POA: Insufficient documentation

## 2013-03-08 DIAGNOSIS — I498 Other specified cardiac arrhythmias: Secondary | ICD-10-CM | POA: Insufficient documentation

## 2013-03-08 DIAGNOSIS — Z85819 Personal history of malignant neoplasm of unspecified site of lip, oral cavity, and pharynx: Secondary | ICD-10-CM | POA: Insufficient documentation

## 2013-03-08 DIAGNOSIS — Z8673 Personal history of transient ischemic attack (TIA), and cerebral infarction without residual deficits: Secondary | ICD-10-CM | POA: Insufficient documentation

## 2013-03-08 DIAGNOSIS — Y929 Unspecified place or not applicable: Secondary | ICD-10-CM | POA: Insufficient documentation

## 2013-03-08 DIAGNOSIS — N183 Chronic kidney disease, stage 3 unspecified: Secondary | ICD-10-CM | POA: Insufficient documentation

## 2013-03-08 DIAGNOSIS — Z87891 Personal history of nicotine dependence: Secondary | ICD-10-CM | POA: Insufficient documentation

## 2013-03-08 DIAGNOSIS — Z9889 Other specified postprocedural states: Secondary | ICD-10-CM | POA: Insufficient documentation

## 2013-03-08 DIAGNOSIS — W108XXA Fall (on) (from) other stairs and steps, initial encounter: Secondary | ICD-10-CM | POA: Insufficient documentation

## 2013-03-08 DIAGNOSIS — Z7982 Long term (current) use of aspirin: Secondary | ICD-10-CM | POA: Insufficient documentation

## 2013-03-08 DIAGNOSIS — Z862 Personal history of diseases of the blood and blood-forming organs and certain disorders involving the immune mechanism: Secondary | ICD-10-CM | POA: Insufficient documentation

## 2013-03-08 DIAGNOSIS — Z880 Allergy status to penicillin: Secondary | ICD-10-CM | POA: Insufficient documentation

## 2013-03-08 DIAGNOSIS — M109 Gout, unspecified: Secondary | ICD-10-CM | POA: Insufficient documentation

## 2013-03-08 DIAGNOSIS — Z79899 Other long term (current) drug therapy: Secondary | ICD-10-CM | POA: Insufficient documentation

## 2013-03-08 DIAGNOSIS — S79919A Unspecified injury of unspecified hip, initial encounter: Secondary | ICD-10-CM | POA: Insufficient documentation

## 2013-03-08 DIAGNOSIS — S79929A Unspecified injury of unspecified thigh, initial encounter: Secondary | ICD-10-CM

## 2013-03-08 DIAGNOSIS — E7800 Pure hypercholesterolemia, unspecified: Secondary | ICD-10-CM | POA: Insufficient documentation

## 2013-03-08 DIAGNOSIS — E039 Hypothyroidism, unspecified: Secondary | ICD-10-CM | POA: Insufficient documentation

## 2013-03-08 DIAGNOSIS — S41109A Unspecified open wound of unspecified upper arm, initial encounter: Secondary | ICD-10-CM | POA: Insufficient documentation

## 2013-03-08 DIAGNOSIS — I5032 Chronic diastolic (congestive) heart failure: Secondary | ICD-10-CM | POA: Insufficient documentation

## 2013-03-08 DIAGNOSIS — Y9389 Activity, other specified: Secondary | ICD-10-CM | POA: Insufficient documentation

## 2013-03-08 DIAGNOSIS — Z954 Presence of other heart-valve replacement: Secondary | ICD-10-CM | POA: Insufficient documentation

## 2013-03-08 DIAGNOSIS — W19XXXA Unspecified fall, initial encounter: Secondary | ICD-10-CM

## 2013-03-08 NOTE — ED Notes (Signed)
Pt reports he slipped on some ice this morning and fell down the stairs. Pt c/o pain to left elbow, left lateral side into back and left ankle. Pt also c/o HA, reports he hit the back on his head, denies pain to posterior head but c/o generalized HA. Denies neck pain. Denies LOC. Pt sts he used to coumadin but stopped a few years ago. Now takes 325 mg ASA everyday. Pt reports he has a skin tear on left elbow. Nad, skin warm and dry, resp e/u.

## 2013-03-08 NOTE — ED Notes (Signed)
Patient still in [place] at this time

## 2013-03-08 NOTE — ED Notes (Addendum)
Pt presents with injuries related to fall on cement at approximately 08:30 this  morning. Pt states that he was wearing "crocs" and slipped down 2 stairs hitting his head and landing on his left side Pt is on coumadin. Pt denies any LOC. Pt has skin tear to left arm dressed by family members. Pain to his left ankle and left flank Pt is ambulatory at time of triage

## 2013-03-08 NOTE — ED Notes (Signed)
Registration at bedside.

## 2013-03-08 NOTE — ED Provider Notes (Addendum)
CSN: [id number]     Arrival date & time 03/08/13  0912 History   First MD Initiated Contact with Patient 03/08/13 [phone]     No chief complaint on file.    (Consider location/radiation/quality/duration/timing/severity/associated sxs/prior Treatment) HPI Patient presents immediately after a fall with pain in multiple areas, and a skin tear. Patient slipped after going down steps. Recalls hitting the left side of his body on the ground.  Subsequently he has had pain in his left distal upper arm, left hip, left foot, left rib cage. He has been ambulatory. He denies dyspnea, head pain, neck pain, confusion, disorientation, visual changes, nausea, vomiting, weakness or dysesthesia in any extremity.  Past Medical History  Diagnosis Date  . Stroke   . Atrial fibrillation     a. amiodarone therapy; not felt to be a candidate for anticoagulation - bruises easily.  . Bradycardia     a. only tolerates metoprolol 12.5mg  daily (bradycardic w/ bid dosing).  . Aortic stenosis     a. Previously severe -> s/p minimally invasive tissue AVR with Dr. Evelina Dun at [facility] 01/2011 (pre-AVR cath with no obs CAD);  b. 12/2012 TEE: EF 60-65%, no veg, mild AS, triv AI.  [name] Kitchen Hypertension   . Anemia   . Action tremor   . Thrombocytopenia     Dr. Benay Spice  . Colon polyp   . Anxiety   . Splenomegaly   . Gout   . GERD (gastroesophageal reflux disease)   . Hypothyroidism   . Leukopenia     Chronic pancytopenia  . Degenerative disc disease   . Joint effusion, knee     left knee  . Synovial cyst of popliteal space   . Cellulitis of left leg 10/11-16/2012  . CKD (chronic kidney disease), stage III   . Chronic diastolic CHF (congestive heart failure)     a. 12/15/2012 TEE: EF 60-65%, no veg.  . Bacteremia     a. 12/2012 - S bovis;  b. 12/2012 TEE w/o veg;  c. Seeing ID->Rocephin therapy extended to 01/25/2013 via PICC for possible endocarditis (No veg on TEE).  . High cholesterol   . Heart murmur   . Diabetes  mellitus     "borderline" (01/05/2013)  . History of blood transfusion 01/2011; 11/2012    "related to heart valve replaced; low HgB count" (01/05/2013)  . H/O hiatal hernia   . Throat cancer     s/p lasered  . Diverticulosis    Past Surgical History  Procedure Laterality Date  . Cholecystectomy    . Joint effusion      left knee  . Aortic valve replacement  01/23/2011    via minimally invasive approach per Dr Evelina Dun, [facility]  . Tee without cardioversion N/A 12/15/2012    Procedure: TRANSESOPHAGEAL ECHOCARDIOGRAM (TEE);  Surgeon: [name], MD;  Location: [facility] ENDOSCOPY;  Service: Cardiovascular;  Laterality: N/A;  . Cardiac valve replacement    . Inguinal hernia repair Right   . Excisional hemorrhoidectomy    . Cardiac catheterization    . Surgery scrotal / testicular      "removed one" (01/05/2013)  . 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 Colon cancer    . Stroke    . Cancer Other  colon  . Stroke Other    History  Substance Use Topics  . Smoking status: Former Smoker -- 0.75 packs/day for 24 years    Types: Cigarettes    Quit date: 01/11/1962  . Smokeless tobacco: Never Used  . Alcohol Use: No    Review of Systems  Constitutional:       Per HPI, otherwise negative  HENT:       Per HPI, otherwise negative  Respiratory:       Per HPI, otherwise negative  Cardiovascular:       Per HPI, otherwise negative  Gastrointestinal: Negative for vomiting.  Endocrine:       Negative aside from HPI  Genitourinary:       Neg aside from HPI   Musculoskeletal:       Per HPI, otherwise negative  Skin: Positive for wound.  Neurological: Negative for syncope.      Allergies  Penicillins and Neomycin-bacitracin zn-polymyx  Home Medications   Current Outpatient Rx    Name  Route  Sig  Dispense  Refill  . allopurinol (ZYLOPRIM) 100 MG tablet   Oral   Take 1 tablet (100 mg total) by mouth daily.   90 tablet   3   . amiodarone (PACERONE) 200 MG tablet   Oral   Take 200 mg by mouth daily.         [name] Kitchen amLODipine (NORVASC) 5 MG tablet   Oral   Take 1 tablet (5 mg total) by mouth daily.   90 tablet   3   . aspirin 325 MG tablet   Oral   Take 325 mg by mouth daily.           . cefTRIAXone (ROCEPHIN) 1 G injection               . Cholecalciferol (VITAMIN D) 1000 UNITS capsule   Oral   Take 1 capsule (1,000 Units total) by mouth daily.   90 capsule   3   . colchicine 0.6 MG tablet   Oral   Take 0.6 mg by mouth as needed. Take one tablet by mouth daily during an acute episode of gout.         . fish oil-omega-3 fatty acids 1000 MG capsule   Oral   Take 1 g by mouth daily.           . Flaxseed, Linseed, (FLAX SEED OIL) 1000 MG CAPS   Oral   Take 1 capsule by mouth daily.          . furosemide (LASIX) 40 MG tablet   Oral   Take 2 tablets (80 mg total) by mouth daily.   180 tablet   3   . levothyroxine (LEVOTHROID) 25 MCG tablet   Oral   Take 1 tablet (25 mcg total) by mouth daily.   90 tablet   3   . Multiple Vitamin (MULTIVITAMIN) tablet   Oral   Take 1 tablet by mouth daily.           [name] Kitchen omeprazole (PRILOSEC) 20 MG capsule   Oral   Take 1 capsule (20 mg total) by mouth daily.   90 capsule   1   . potassium chloride (K-DUR) 10 MEQ tablet   Oral   Take 1 tablet (10 mEq total) by mouth daily.   90 tablet   3   . vitamin C (ASCORBIC ACID) 500 MG tablet   Oral   Take 1 tablet (500 mg  total) by mouth daily.   90 tablet   3    BP 172/54  Pulse 53  Temp(Src) 97.5 F (36.4 C) (Oral)  Resp 18  SpO2 100% Physical Exam  Nursing note and vitals reviewed. Constitutional: He is oriented to person, place, and time. He appears well-developed. No distress.  HENT:  Head: Normocephalic and atraumatic.  Eyes:  Conjunctivae and EOM are normal. Right eye exhibits no discharge. Left eye exhibits no discharge.  Neck:  Neck is soft, supple, full range of motion, no abnormalities or any tenderness to palpation  Cardiovascular: Normal rate and regular rhythm.   Pulmonary/Chest: Effort normal. No stridor. No respiratory distress.  Abdominal: He exhibits no distension.  Musculoskeletal: He exhibits no edema.       Arms:      Legs: Neurological: He is alert and oriented to person, place, and time.  Skin: Skin is warm and dry.  Psychiatric: He has a normal mood and affect.    ED Course  Procedures (including critical care time) Imaging Review Dg Ribs Unilateral W/chest Left  03/08/2013   CLINICAL DATA:  Fall with left posterior rib pain.  EXAM: LEFT RIBS AND CHEST - 3+ VIEW  COMPARISON:  01/04/2013  FINDINGS: Lungs are adequately inflated and otherwise clear. There is mild stable cardiomegaly. There are calcified bilateral hilar nodes. There is an old left posterior lateral eighth rib fracture. There is no acute rib fracture. No evidence pneumothorax. Remainder the exam is unchanged.  IMPRESSION: No acute left rib fracture.  No acute cardiopulmonary disease.   Electronically Signed   By: Marin Olp M.D.   On: 03/08/2013 11:36   Dg Hip Complete Left  03/08/2013   CLINICAL DATA:  Pain post fall  EXAM: LEFT HIP - COMPLETE 2+ VIEW  COMPARISON:  CT 08/29/2013  FINDINGS: There is no evidence of hip fracture or dislocation. Normal mineralization and alignment. Subchondral cystic changes in the right humeral head as before. Spurring in the visualized lower lumbar spine.  IMPRESSION: Negative for fracture, dislocation, or other acute abnormality   Electronically Signed   By: Arne Cleveland M.D.   On: 03/08/2013 11:37   Dg Foot Complete Left  03/08/2013   CLINICAL DATA:  Fall, left foot pain  EXAM: LEFT FOOT - COMPLETE 3+ VIEW  COMPARISON:  None.  FINDINGS: There is no evidence of fracture or dislocation. There is no  evidence of arthropathy or other focal bone abnormality. Small posterior spur of calcaneus.  IMPRESSION: Negative.   Electronically Signed   By: Lahoma Crocker M.D.   On: 03/08/2013 11:42    Update: Patient and in no distress.  Wound was repaired by nursing staff.  LACERATION REPAIR Performed by: Carmin Muskrat Authorized by: Carmin Muskrat Consent: Verbal consent obtained. Risks and benefits: risks, benefits and alternatives were discussed Consent given by: patient Patient identity confirmed: provided demographic data Prepped and Draped in normal sterile fashion Wound explored  Laceration Location: L elbow  Laceration Length: 3 inches  No Foreign Bodies seen or palpated  Irrigation method: syringe Amount of cleaning: standard  Skin closure: steristrips - via RN    Technique: rough approximation of tear  Patient tolerance: Patient tolerated the procedure well with no immediate complications.  MDM  Patient presents with a mechanical fall with pain throughout the left side, skin tear on the left upper arm.  Patient is neurovascularly intact, and in no distress, with no head or neck pain.  Patient takes no blood thinning medication.  Patient remained hemodynamically stable, and in no distress throughout his emergency department course.  [name] wound is repaired with the assistance of nursing staff.  He was discharged in stable condition.  Carmin Muskrat, MD 03/08/13 1148  Carmin Muskrat, MD 03/17/13 [phone]

## 2013-03-08 NOTE — Discharge Instructions (Signed)
Your x-rays have not demonstrated fracture.  However, after fall, pain is to be expected for the next several days.  Please use Tylenol, ice packs for pain relief.  Your skin tear has been repaired with adhesive strips.  These will fall off when it is time.  Please keep the wound covered as is, until 2 days from now.  At that point he may remove the exterior dressing, slightly shower the wound.  Please then dry and redress the wound.  Return here for any concerning changes in your condition.

## 2013-03-08 NOTE — ED Notes (Signed)
Patient returned from X-ray 

## 2013-03-17 ENCOUNTER — Encounter: Payer: Self-pay | Admitting: Internal Medicine

## 2013-03-17 ENCOUNTER — Ambulatory Visit (INDEPENDENT_AMBULATORY_CARE_PROVIDER_SITE_OTHER): Payer: Medicare Other | Admitting: Internal Medicine

## 2013-03-17 ENCOUNTER — Ambulatory Visit: Payer: Medicare Other | Admitting: Cardiovascular Disease

## 2013-03-17 VITALS — BP 120/50 | HR 49 | Temp 98.0°F | Wt 188.1 lb

## 2013-03-17 DIAGNOSIS — IMO0002 Reserved for concepts with insufficient information to code with codable children: Secondary | ICD-10-CM

## 2013-03-17 DIAGNOSIS — W19XXXA Unspecified fall, initial encounter: Secondary | ICD-10-CM

## 2013-03-17 DIAGNOSIS — Y92009 Unspecified place in unspecified non-institutional (private) residence as the place of occurrence of the external cause: Secondary | ICD-10-CM

## 2013-03-17 DIAGNOSIS — R0789 Other chest pain: Secondary | ICD-10-CM

## 2013-03-17 DIAGNOSIS — R071 Chest pain on breathing: Secondary | ICD-10-CM

## 2013-03-17 DIAGNOSIS — S40812A Abrasion of left upper arm, initial encounter: Secondary | ICD-10-CM

## 2013-03-17 MED ORDER — TRAMADOL HCL 50 MG PO TABS: 50.0000 mg | ORAL_TABLET | Freq: Four times a day (QID) | ORAL | Status: DC | PRN

## 2013-03-17 NOTE — Progress Notes (Signed)
Subjective:    Patient ID: [name], male    DOB: [birth year], 80 y.o.   MRN: [id number]  [facility] records 03/08/13 were reviewed. He slipped going down icy steps and fell landing on his left side. He experienced pain in the distal left upper extremity, left hip, left foot and left rib cage. He also sustained a skin tear of the left upper extremity.  Films of the ribs, hip, and foot were negative except for an old rib fracture. There is no pneumothorax or pulmonary injury.  The wound was treated in dressed in the emergency room.  Since he was in the emergency room he has noted the onset of pain in the left inferior rib cage. He has no associated dyspnea or hemoptysis.    Review of Systems No hematuria or hemoptysis. No dyspnea  Specifically he denied any cardiac or neuro prodrome prior to the fall.     Objective:   Physical Exam Gen.:  Adequately nourished in appearance. Alert, appropriate and cooperative throughout exam. Appears younger than stated age  Head: Normocephalic without obvious abnormalities;  patternalopecia  Eyes: No corneal or conjunctival inflammation noted. Lungs: Normal respiratory effort; chest expands symmetrically. Lungs are clear to auscultation without rales, wheezes, or increased work of breathing.  There is tenderness to palpation over the left inferior rib. Heart: Normal rate and rhythm. Normal S1 and S2. No gallop, click, or rub. Grade 1.5 /6 systolic murmur Abdomen: Bowel sounds normal; abdomen soft and nontender. No masses, organomegaly or hernias noted.                               Musculoskeletal/extremities: No deformity or scoliosis noted of  the thoracic or lumbar spine.                                    No clubbing or  cyanosis deformity noted. Tone & strength normal. Hand joints reveal mild  DJD DIP changes.  Fingernail health good. Vascular: Carotid, radial artery, dorsalis pedis and  posterior tibial pulses are full and equal. L  carotid  Bruit vs murmur radiation present. Neurologic: Alert and oriented x3. Deep tendon reflexes symmetrical and normal.       Skin: Diffuse ecchymosis below the left inferior rib cage. A 6 cm x 9 cm abrasion left posterior upper arm. No evidence of cellulitis or purulence. Extensive ecchymosis across the lumbosacral spine. 10X 0.7 mm skin horn  L shoulder area. Lymph: No cervical, axillary lymphadenopathy present. Psych: Mood and affect are normal. Normally interactive  Assessment & Plan:  #1 fall with musculoskeletal injury. Possible hairline fracture of the left inferior rib cage. No evidence of pulmonary contusion or pneumothorax a radiograph or clinically at this time  #2 abrasion left upper extremity. No evidence of purulence.  Plan: Wet-to-dry saline dressings recommended. Wound was covered with Telfa.  He is to report any dyspnea or increase in the inferior/posterior  thoracic pain.

## 2013-03-17 NOTE — Patient Instructions (Signed)
Dip gauze in  sterile saline and applied to the wound twice a day. Cover the wound with Telfa , non stick dressing  without any antibiotic ointment. The saline can be purchased at the drugstore or you can make your own .Boil cup of salt in a gallon of water. Store mixture  in a clean container.Report Warning  signs as discussed (red streaks, pus, fever, increasing pain). 

## 2013-03-17 NOTE — Progress Notes (Signed)
Pre visit review using our clinic review tool, if applicable. No additional management support is needed unless otherwise documented below in the visit note. 

## 2013-03-19 ENCOUNTER — Other Ambulatory Visit: Payer: Self-pay

## 2013-03-19 MED ORDER — AMIODARONE HCL 200 MG PO TABS: 200.0000 mg | ORAL_TABLET | Freq: Every day | ORAL | Status: DC

## 2013-04-14 ENCOUNTER — Ambulatory Visit (INDEPENDENT_AMBULATORY_CARE_PROVIDER_SITE_OTHER): Payer: Medicare Other | Admitting: Internal Medicine

## 2013-04-14 ENCOUNTER — Encounter: Payer: Self-pay | Admitting: Internal Medicine

## 2013-04-14 VITALS — BP 130/50 | HR 55 | Temp 98.2°F | Resp 15 | Wt 189.0 lb

## 2013-04-14 DIAGNOSIS — R609 Edema, unspecified: Secondary | ICD-10-CM

## 2013-04-14 DIAGNOSIS — R600 Localized edema: Secondary | ICD-10-CM

## 2013-04-14 DIAGNOSIS — D61818 Other pancytopenia: Secondary | ICD-10-CM

## 2013-04-14 MED ORDER — PROPRANOLOL HCL 10 MG PO TABS: 10.0000 mg | ORAL_TABLET | Freq: Two times a day (BID) | ORAL | Status: DC

## 2013-04-14 NOTE — Patient Instructions (Signed)
Share lab results with all non [place] medical staff seen

## 2013-04-14 NOTE — Progress Notes (Signed)
Pre visit review using our clinic review tool, if applicable. No additional management support is needed unless otherwise documented below in the visit note. 

## 2013-04-14 NOTE — Progress Notes (Signed)
   Subjective:    Patient ID: [name], male    DOB: [birth year], 80 y.o.   MRN: [id number]  HPI He had extensive labs performed at the [facility] 03/20/13. He received notification in writing with their concerns about his blood counts.  Specifically hemoglobin was 9.3, hematocrit 29.5, and platelet count 64,000. He obviously did not have copies of his prior labs.  On 03/02/13 hemoglobin was 9.7, hematocrit 29.2, and platelet count 42,000. Thus  these values are stable to improved.  Creatinine was 2.2 with estimated glomerular filtration rate of 28. On 02/27/13 his creatinine was 1.63. His most recent GFR was 37.    His glucose was 136 ; A1c was in the nondiabetic range.  HDL was 28 and LDL 12.  TSH was 5.33; significantly he is on amiodarone.    Review of Systems He denies epistaxis, hemoptysis, hematuria, melena, or rectal bleeding. He has no unexplained weight loss, dysphagia, or abdominal pain. He has chronic bruising . He has some difficulty stopping bleeding with injury.     Objective:   Physical Exam  General appearance is one of good health and nourishment w/o distress.  Eyes: No conjunctival inflammation or scleral icterus is present.  Oral exam: upper plate; dental hygiene is good; lips and gums are healthy appearing.There is no oropharyngeal erythema or exudate noted.   Heart:  Normal rate and regular rhythm. S1 and S2 normal without gallop, click, rub or other extra sounds  .Grade 8.5-2  harsh systolic murmur   Lungs:Chest clear to auscultation; no wheezes, rhonchi,rales ,or rubs present.No increased work of breathing.   Abdomen: bowel sounds normal, soft and non-tender without masses, or organomegaly .Ventral  hernia noted.  No guarding or rebound .   Musculoskeletal: Able to lie flat and sit up without help.  Skin:Warm & dry. Diffuse ecchymoses  Lymphatic: No lymphadenopathy is noted about the head, neck, axilla               Assessment & Plan:  #1  pancytopenia, stable to improved  #2 borderline hypothyroidism in the context of amiodarone therapy.  Plan: At this time there is no indication for change in medication therapy. He's been asked to take copies of his labs from here to all [place] visits

## 2013-06-04 ENCOUNTER — Other Ambulatory Visit (HOSPITAL_BASED_OUTPATIENT_CLINIC_OR_DEPARTMENT_OTHER): Payer: Medicare Other

## 2013-06-04 ENCOUNTER — Ambulatory Visit (HOSPITAL_BASED_OUTPATIENT_CLINIC_OR_DEPARTMENT_OTHER): Payer: Medicare Other | Admitting: Oncology

## 2013-06-04 ENCOUNTER — Telehealth: Payer: Self-pay | Admitting: Oncology

## 2013-06-04 VITALS — BP 149/45 | HR 51 | Temp 98.0°F | Resp 17 | Ht 70.0 in | Wt 191.6 lb

## 2013-06-04 DIAGNOSIS — D61818 Other pancytopenia: Secondary | ICD-10-CM

## 2013-06-04 DIAGNOSIS — R161 Splenomegaly, not elsewhere classified: Secondary | ICD-10-CM

## 2013-06-04 DIAGNOSIS — E119 Type 2 diabetes mellitus without complications: Secondary | ICD-10-CM

## 2013-06-04 DIAGNOSIS — N289 Disorder of kidney and ureter, unspecified: Secondary | ICD-10-CM

## 2013-06-04 DIAGNOSIS — D649 Anemia, unspecified: Secondary | ICD-10-CM

## 2013-06-04 DIAGNOSIS — D696 Thrombocytopenia, unspecified: Secondary | ICD-10-CM

## 2013-06-04 DIAGNOSIS — Z86010 Personal history of colonic polyps: Secondary | ICD-10-CM

## 2013-06-04 LAB — CBC WITH DIFFERENTIAL/PLATELET
BASO%: 0.5 % (ref 0.0–2.0)
BASOS ABS: 0 10*3/uL (ref 0.0–0.1)
EOS ABS: 0.1 10*3/uL (ref 0.0–0.5)
EOS%: 2.4 % (ref 0.0–7.0)
HCT: 29.5 % — ABNORMAL LOW (ref 38.4–49.9)
HEMOGLOBIN: 9.7 g/dL — AB (ref 13.0–17.1)
LYMPH#: 0.4 10*3/uL — AB (ref 0.9–3.3)
LYMPH%: 18.5 % (ref 14.0–49.0)
MCH: 28.1 pg (ref 27.2–33.4)
MCHC: 32.9 g/dL (ref 32.0–36.0)
MCV: 85.5 fL (ref 79.3–98.0)
MONO#: 0.2 10*3/uL (ref 0.1–0.9)
MONO%: 8.1 % (ref 0.0–14.0)
NEUT#: 1.5 10*3/uL (ref 1.5–6.5)
NEUT%: 70.5 % (ref 39.0–75.0)
NRBC: 0 % (ref 0–0)
Platelets: 35 10*3/uL — ABNORMAL LOW (ref 140–400)
RBC: 3.45 10*6/uL — AB (ref 4.20–5.82)
RDW: 16.8 % — AB (ref 11.0–14.6)
WBC: 2.1 10*3/uL — AB (ref 4.0–10.3)

## 2013-06-04 NOTE — Telephone Encounter (Signed)
gv adn printed appt sched anda vs for pt for Aug °

## 2013-06-04 NOTE — Progress Notes (Signed)
  [place] OFFICE PROGRESS NOTE   Diagnosis: Pancytopenia  INTERVAL HISTORY:   [name] returns as scheduled. He reports malaise. He recently had multiple skin lesions "burn" from the face and scalp. One of these areas became infected earlier this week and he began Keflex. No fever or bleeding.  Objective:  Vital signs in last 24 hours:  Blood pressure 149/45, pulse 51, temperature 98 F (36.7 C), temperature source Oral, resp. rate 17, height 5\' 10"  (1.778 m), weight 191 lb 9.6 oz (86.909 kg).    HEENT: Multiple scans over the face and scalp. Mild erythema and induration surrounding a lesion at the right cheek. Resp: Lungs clear bilaterally Cardio: Regular rate and rhythm GI: No hepatosplenomegaly Vascular: Trace edema at the lower leg bilaterally with support stockings in place Skin: Scattered small ecchymoses over the trunk and extremities, diffuse brown/purple discoloration over the forearms    Lab Results:  Lab Results  Component Value Date   WBC 2.1* 06/04/2013   HGB 9.7* 06/04/2013   HCT 29.5* 06/04/2013   MCV 85.5 06/04/2013   PLT 35* 06/04/2013   NEUTROABS 1.5 06/04/2013     Medications: I have reviewed the patient's current medications.  Assessment/Plan: 1. Chronic pancytopenia/splenomegaly-likely related to a chronic lymphoproliferative disorder,? Splenic lymphoma  2. History of severe aortic stenosis, status post aortic valve replacement surgery at [facility] on 01/23/2011  3. History of gout  4. Diabetes  5. Streptococcus bacteremia-TEE negative for endocarditis  6. Malaise-likely secondary to #5 , improved  7. Progressive anemia-? Secondary to bacteremia, status post a red cell transfusion 12/11/2012, stable  8. Renal insufficiency  9. Paroxysmal atrial fibrillation  10. Colon polyps noted on a virtual colonoscopy 02/10/2013  11. Multiple abrasions over the face and scalp following a dermatology procedure   Disposition:  [name] is  stable from a hematologic standpoint. He will contact us for bleeding. He knows to seek medical attention for a fever or if one of the face lesions becomes more erythematous.  [name] will return for an office visit in 3 months.  Ladell Pier, MD  06/04/2013  1:56 PM

## 2013-06-25 ENCOUNTER — Encounter: Payer: Self-pay | Admitting: Cardiovascular Disease

## 2013-06-25 ENCOUNTER — Ambulatory Visit (INDEPENDENT_AMBULATORY_CARE_PROVIDER_SITE_OTHER): Payer: Medicare Other | Admitting: Cardiovascular Disease

## 2013-06-25 ENCOUNTER — Other Ambulatory Visit: Payer: Medicare Other

## 2013-06-25 ENCOUNTER — Other Ambulatory Visit (INDEPENDENT_AMBULATORY_CARE_PROVIDER_SITE_OTHER): Payer: Medicare Other

## 2013-06-25 VITALS — BP 148/52 | HR 98 | Ht 70.0 in | Wt 195.4 lb

## 2013-06-25 DIAGNOSIS — I359 Nonrheumatic aortic valve disorder, unspecified: Secondary | ICD-10-CM

## 2013-06-25 DIAGNOSIS — D61818 Other pancytopenia: Secondary | ICD-10-CM

## 2013-06-25 DIAGNOSIS — I480 Paroxysmal atrial fibrillation: Secondary | ICD-10-CM

## 2013-06-25 DIAGNOSIS — I2510 Atherosclerotic heart disease of native coronary artery without angina pectoris: Secondary | ICD-10-CM

## 2013-06-25 DIAGNOSIS — I4891 Unspecified atrial fibrillation: Secondary | ICD-10-CM

## 2013-06-25 DIAGNOSIS — D696 Thrombocytopenia, unspecified: Secondary | ICD-10-CM

## 2013-06-25 LAB — BASIC METABOLIC PANEL
BUN: 34 mg/dL — ABNORMAL HIGH (ref 6–23)
CHLORIDE: 101 meq/L (ref 96–112)
CO2: 28 mEq/L (ref 19–32)
Calcium: 9 mg/dL (ref 8.4–10.5)
Creatinine, Ser: 2 mg/dL — ABNORMAL HIGH (ref 0.4–1.5)
GFR: 33.58 mL/min — ABNORMAL LOW (ref >60.00)
Glucose, Bld: 182 mg/dL — ABNORMAL HIGH (ref 70–99)
Potassium: 4.6 mEq/L (ref 3.5–5.1)
Sodium: 135 mEq/L (ref 135–145)

## 2013-06-25 LAB — CBC WITH DIFFERENTIAL/PLATELET
BASOS PCT: 0 % (ref 0.0–3.0)
Basophils Absolute: 0 10*3/uL (ref 0.0–0.1)
EOS PCT: 1.9 % (ref 0.0–5.0)
Eosinophils Absolute: 0 10*3/uL (ref 0.0–0.7)
HEMATOCRIT: 27.8 % — AB (ref 39.0–52.0)
Hemoglobin: 9.2 g/dL — ABNORMAL LOW (ref 13.0–17.0)
LYMPHS ABS: 0.3 10*3/uL — AB (ref 0.7–4.0)
Lymphocytes Relative: 19.1 % (ref 12.0–46.0)
MCHC: 33.1 g/dL (ref 30.0–36.0)
MCV: 87.3 fl (ref 78.0–100.0)
Monocytes Absolute: 0.1 10*3/uL (ref 0.1–1.0)
Monocytes Relative: 7.8 % (ref 3.0–12.0)
Neutro Abs: 1.3 10*3/uL — ABNORMAL LOW (ref 1.4–7.7)
Neutrophils Relative %: 71.2 % (ref 43.0–77.0)
RBC: 3.19 Mil/uL — AB (ref 4.22–5.81)
RDW: 18.7 % — ABNORMAL HIGH (ref 11.5–15.5)

## 2013-06-25 LAB — HEPATIC FUNCTION PANEL
ALK PHOS: 82 U/L (ref 39–117)
ALT: 28 U/L (ref 0–53)
AST: 34 U/L (ref 0–37)
Albumin: 3.9 g/dL (ref 3.5–5.2)
BILIRUBIN DIRECT: 0.1 mg/dL (ref 0.0–0.3)
TOTAL PROTEIN: 6.6 g/dL (ref 6.0–8.3)
Total Bilirubin: 0.4 mg/dL (ref 0.2–1.2)

## 2013-06-25 LAB — TSH: TSH: 5.58 u[IU]/mL — AB (ref 0.35–4.50)

## 2013-06-25 LAB — T4, FREE: Free T4: 1.12 ng/dL (ref 0.60–1.60)

## 2013-06-25 NOTE — Patient Instructions (Signed)
Your physician recommends that you return for a FASTING TSH, Free T4, BMP, Liver and Lipid in 3 MONTHS--nothing to eat or drink after midnight, lab opens at 7:30 am (one week prior to Dr Burt Knack appointment)  Your physician has requested that you have an echocardiogram in 3 MONTHS (one week prior to Dr Burt Knack appointment). Echocardiography is a painless test that uses sound waves to create images of your heart. It provides your doctor with information about the size and shape of your heart and how well your heart's chambers and valves are working. This procedure takes approximately one hour. There are no restrictions for this procedure.  Your physician recommends that you schedule a follow-up appointment in: 3 MONTHS with Dr Burt Knack

## 2013-06-25 NOTE — Progress Notes (Signed)
HPI:  80 year old gentleman presenting for followup evaluation. The patient is followed for atrial fibrillation, aortic stenosis status post bioprosthetic aortic valve replacement, bradycardia, diastolic heart failure, and coronary artery disease. The patient has not been anticoagulated because of advanced age, recurrent falls, and pancytopenia with platelets less than 50,000.  He reports no recent change in symptoms. He continues to have shortness of breath with exertion, leg swelling, and generalized weakness. He denies chest pain or pressure. He's had no orthopnea, PND, lightheadedness, or syncope. He's not had any symptoms like those associated with his previous atrial fibrillation.  Outpatient Encounter Prescriptions as of 06/25/2013  Medication Sig  . allopurinol (ZYLOPRIM) 100 MG tablet Take 1 tablet (100 mg total) by mouth daily.  [name] Kitchen amiodarone (PACERONE) 200 MG tablet Take 1 tablet (200 mg total) by mouth daily.  [name] Kitchen amLODipine (NORVASC) 5 MG tablet Take 1 tablet (5 mg total) by mouth daily.  [name] Kitchen aspirin 325 MG tablet Take 325 mg by mouth daily.    . Cholecalciferol (VITAMIN D) 1000 UNITS capsule Take 1 capsule (1,000 Units total) by mouth daily.  . colchicine 0.6 MG tablet Take 0.6 mg by mouth daily as needed (gout flares). Take one tablet by mouth daily during an acute episode of gout.  . fish oil-omega-3 fatty acids 1000 MG capsule Take 1 g by mouth daily.    . Flaxseed, Linseed, (FLAX SEED OIL) 1000 MG CAPS Take 1 capsule by mouth daily.   . furosemide (LASIX) 40 MG tablet Take 2 tablets (80 mg total) by mouth daily.  [name] Kitchen levothyroxine (LEVOTHROID) 25 MCG tablet Take 1 tablet (25 mcg total) by mouth daily.  . Multiple Vitamin (MULTIVITAMIN) tablet Take 1 tablet by mouth daily.    [name] Kitchen omeprazole (PRILOSEC) 20 MG capsule Take 1 capsule (20 mg total) by mouth daily.  . potassium chloride (K-DUR) 10 MEQ tablet Take 1 tablet (10 mEq total) by mouth daily.  . propranolol (INDERAL) 10 MG tablet  Take 1 tablet (10 mg total) by mouth 2 (two) times daily.  . vitamin C (ASCORBIC ACID) 500 MG tablet Take 1 tablet (500 mg total) by mouth daily.  . [DISCONTINUED] cephALEXin (KEFLEX) 500 MG capsule Take 500 mg by mouth 3 (three) times daily. Skin infection on face  . [DISCONTINUED] mupirocin ointment (BACTROBAN) 2 % Apply topically.  . [DISCONTINUED] traMADol (ULTRAM) 50 MG tablet Take 1 tablet (50 mg total) by mouth every 6 (six) hours as needed.    Allergies  Allergen Reactions  . Penicillins Other (See Comments)    Does not remember reaction (~year 1950)  . Neomycin-Bacitracin Zn-Polymyx     ? Reaction (thinks he remembers redness)    Past Medical History  Diagnosis Date  . Stroke   . Atrial fibrillation     a. amiodarone therapy; not felt to be a candidate for anticoagulation - bruises easily.  . Bradycardia     a. only tolerates metoprolol 12.5mg  daily (bradycardic w/ bid dosing).  . Aortic stenosis     a. Previously severe -> s/p minimally invasive tissue AVR with Dr. Evelina Dun at [facility] 01/2011 (pre-AVR cath with no obs CAD);  b. 12/2012 TEE: EF 60-65%, no veg, mild AS, triv AI.  [name] Kitchen Hypertension   . Anemia   . Action tremor   . Thrombocytopenia     Dr. Benay Spice  . Colon polyp   . Anxiety   . Splenomegaly   . Gout   . GERD (gastroesophageal reflux disease)   . Hypothyroidism   .  Leukopenia     Chronic pancytopenia  . Degenerative disc disease   . Joint effusion, knee     left knee  . Synovial cyst of popliteal space   . Cellulitis of left leg 10/11-16/2012  . CKD (chronic kidney disease), stage III   . Chronic diastolic CHF (congestive heart failure)     a. 12/15/2012 TEE: EF 60-65%, no veg.  . Bacteremia     a. 12/2012 - S bovis;  b. 12/2012 TEE w/o veg;  c. Seeing ID->Rocephin therapy extended to 01/25/2013 via PICC for possible endocarditis (No veg on TEE).  . High cholesterol   . Heart murmur   . Diabetes mellitus     "borderline" (01/05/2013)  . History of blood  transfusion 01/2011; 11/2012    "related to heart valve replaced; low HgB count" (01/05/2013)  . H/O hiatal hernia   . Throat cancer     s/p lasered  . Diverticulosis     ROS: Negative except as per HPI  BP 148/52  Pulse 98  Ht 5\' 10"  (1.778 m)  Wt 88.633 kg (195 lb 6.4 oz)  BMI 28.04 kg/m2  PHYSICAL EXAM: Pt is alert and oriented, pleasant elderly male in NAD HEENT: normal Neck: JVP - normal Lungs: CTA bilaterally CV: RRR without murmur or gallop Abd: soft, NT, Positive BS, no hepatomegaly Ext: 1+ pretibial edema bilaterally Skin: Diffuse ecchymoses over the arms  ASSESSMENT AND PLAN: 1. Paroxysmal atrial fibrillation. The patient is maintaining sinus rhythm. He's tolerating amiodarone at low dose. Thyroid and liver function tests as well as a full metabolic panel were checked today and reviewed. He is not a candidate for anticoagulation because of his marked pancytopenia.  2. Aortic stenosis status post bioprosthetic aortic valve replacement. Normal prosthetic aortic valve function by most recent echo.  3. Essential hypertension. This is currently controlled.  4. Coronary artery disease. The patient has no symptoms of angina  [name] 06/26/2013 5:29 PM

## 2013-06-26 ENCOUNTER — Encounter: Payer: Self-pay | Admitting: Cardiovascular Disease

## 2013-07-06 ENCOUNTER — Other Ambulatory Visit: Payer: Self-pay | Admitting: *Deleted

## 2013-07-06 DIAGNOSIS — D696 Thrombocytopenia, unspecified: Secondary | ICD-10-CM

## 2013-07-23 ENCOUNTER — Other Ambulatory Visit: Payer: Self-pay

## 2013-07-23 MED ORDER — OMEPRAZOLE 20 MG PO CPDR: 20.0000 mg | DELAYED_RELEASE_CAPSULE | Freq: Every day | ORAL | Status: DC

## 2013-07-31 ENCOUNTER — Telehealth: Payer: Self-pay | Admitting: *Deleted

## 2013-07-31 DIAGNOSIS — C148 Malignant neoplasm of overlapping sites of lip, oral cavity and pharynx: Secondary | ICD-10-CM

## 2013-07-31 DIAGNOSIS — D696 Thrombocytopenia, unspecified: Secondary | ICD-10-CM

## 2013-07-31 NOTE — Telephone Encounter (Signed)
Pt called stating "I'm feeling really tired and give out"  States he took wife to doctor today; has been really tired; slightly SOB; no fever; no bleeding; eating/drinking well with no other complaints but "so tired"  Per Selena Lesser, NP; instructed pt that he needs to go to ED if symptoms get worse and we can see him Monday and get labs and see Selena Lesser, NP for assessment.  Pt states "I should be fine until Monday"  Confirmed appt for lab 8:45 08/03/13 then NP @ 9:30.  Pt verbalized understanding and when instructed again to go to ED if symptoms worse; stated he would do so.

## 2013-07-31 NOTE — Telephone Encounter (Signed)
Received message this am from pt asking to have "blood drawn"  Attempted X 3 to contact pt; left message on home and cell #.

## 2013-07-31 NOTE — Addendum Note (Signed)
Addended by: Domenic Schwab on: 07/31/2013 04:52 PM   Modules accepted: Orders

## 2013-08-01 ENCOUNTER — Encounter: Payer: Self-pay | Admitting: Internal Medicine

## 2013-08-01 ENCOUNTER — Ambulatory Visit (INDEPENDENT_AMBULATORY_CARE_PROVIDER_SITE_OTHER): Payer: Medicare Other | Admitting: Internal Medicine

## 2013-08-01 VITALS — BP 120/70 | Temp 98.2°F | Wt 193.0 lb

## 2013-08-01 DIAGNOSIS — L02419 Cutaneous abscess of limb, unspecified: Secondary | ICD-10-CM

## 2013-08-01 DIAGNOSIS — L03119 Cellulitis of unspecified part of limb: Secondary | ICD-10-CM

## 2013-08-01 DIAGNOSIS — L03116 Cellulitis of left lower limb: Secondary | ICD-10-CM | POA: Insufficient documentation

## 2013-08-01 MED ORDER — CEPHALEXIN 500 MG PO CAPS: 500.0000 mg | ORAL_CAPSULE | Freq: Three times a day (TID) | ORAL | Status: DC

## 2013-08-01 NOTE — Progress Notes (Signed)
Pre visit review using our clinic review tool, if applicable. No additional management support is needed unless otherwise documented below in the visit note. 

## 2013-08-01 NOTE — Progress Notes (Signed)
Subjective:    Patient ID: [name], male    DOB: 1922/07/24, 80 y.o.   MRN: [id number]  HPI Here with wife Concerned about recurrence of cellulitis in left calf--though it has been some time Chronic swelling in calf  Noticed heat and pain yesterday Took left over antibiotic yesterday---seemed to help Still sore in posterior lower calf  Current Outpatient Prescriptions on File Prior to Visit  Medication Sig Dispense Refill  . allopurinol (ZYLOPRIM) 100 MG tablet Take 1 tablet (100 mg total) by mouth daily.  90 tablet  3  . amiodarone (PACERONE) 200 MG tablet Take 1 tablet (200 mg total) by mouth daily.  90 tablet  3  . amLODipine (NORVASC) 5 MG tablet Take 1 tablet (5 mg total) by mouth daily.  90 tablet  3  . aspirin 325 MG tablet Take 325 mg by mouth daily.        . Cholecalciferol (VITAMIN D) 1000 UNITS capsule Take 1 capsule (1,000 Units total) by mouth daily.  90 capsule  3  . colchicine 0.6 MG tablet Take 0.6 mg by mouth daily as needed (gout flares). Take one tablet by mouth daily during an acute episode of gout.      . fish oil-omega-3 fatty acids 1000 MG capsule Take 1 g by mouth daily.        . Flaxseed, Linseed, (FLAX SEED OIL) 1000 MG CAPS Take 1 capsule by mouth daily.       . furosemide (LASIX) 40 MG tablet Take 2 tablets (80 mg total) by mouth daily.  180 tablet  3  . levothyroxine (LEVOTHROID) 25 MCG tablet Take 1 tablet (25 mcg total) by mouth daily.  90 tablet  3  . Multiple Vitamin (MULTIVITAMIN) tablet Take 1 tablet by mouth daily.        [name] Kitchen omeprazole (PRILOSEC) 20 MG capsule Take 1 capsule (20 mg total) by mouth daily.  90 capsule  1  . potassium chloride (K-DUR) 10 MEQ tablet Take 1 tablet (10 mEq total) by mouth daily.  90 tablet  3  . propranolol (INDERAL) 10 MG tablet Take 1 tablet (10 mg total) by mouth 2 (two) times daily.  30 tablet  0  . vitamin C (ASCORBIC ACID) 500 MG tablet Take 1 tablet (500 mg total) by mouth daily.  90 tablet  3   No current  facility-administered medications on file prior to visit.    Allergies  Allergen Reactions  . Penicillins Other (See Comments)    Does not remember reaction (~year 1950)  . Neomycin-Bacitracin Zn-Polymyx     ? Reaction (thinks he remembers redness)    Past Medical History  Diagnosis Date  . Stroke   . Atrial fibrillation     a. amiodarone therapy; not felt to be a candidate for anticoagulation - bruises easily.  . Bradycardia     a. only tolerates metoprolol 12.5mg  daily (bradycardic w/ bid dosing).  . Aortic stenosis     a. Previously severe -> s/p minimally invasive tissue AVR with Dr. Evelina Dun at [facility] 01/2011 (pre-AVR cath with no obs CAD);  b. 12/2012 TEE: EF 60-65%, no veg, mild AS, triv AI.  [name] Kitchen Hypertension   . Anemia   . Action tremor   . Thrombocytopenia     Dr. Benay Spice  . Colon polyp   . Anxiety   . Splenomegaly   . Gout   . GERD (gastroesophageal reflux disease)   . Hypothyroidism   . Leukopenia  Chronic pancytopenia  . Degenerative disc disease   . Joint effusion, knee     left knee  . Synovial cyst of popliteal space   . Cellulitis of left leg 10/11-16/2012  . CKD (chronic kidney disease), stage III   . Chronic diastolic CHF (congestive heart failure)     a. 12/15/2012 TEE: EF 60-65%, no veg.  . Bacteremia     a. 12/2012 - S bovis;  b. 12/2012 TEE w/o veg;  c. Seeing ID->Rocephin therapy extended to 01/25/2013 via PICC for possible endocarditis (No veg on TEE).  . High cholesterol   . Heart murmur   . Diabetes mellitus     "borderline" (01/05/2013)  . History of blood transfusion 01/2011; 11/2012    "related to heart valve replaced; low HgB count" (01/05/2013)  . H/O hiatal hernia   . Throat cancer     s/p lasered  . Diverticulosis     Past Surgical History  Procedure Laterality Date  . Cholecystectomy    . Joint effusion      left knee  . Aortic valve replacement  01/23/2011    via minimally invasive approach per Dr Evelina Dun, [facility]  . Tee without  cardioversion N/A 12/15/2012    Procedure: TRANSESOPHAGEAL ECHOCARDIOGRAM (TEE);  Surgeon: [name], MD;  Location: [facility] ENDOSCOPY;  Service: Cardiovascular;  Laterality: N/A;  . Cardiac valve replacement    . Inguinal hernia repair Right   . Excisional hemorrhoidectomy    . Cardiac catheterization    . Surgery scrotal / testicular      "removed one" (01/05/2013)  . Microlaryngoscopy with co2 laser and excision of vocal cord lesion  1980's    "throat cancer on his vocal cord; had it lasered; never had chemo; later had to laser off the scar tissue"    Family History  Problem Relation Age of Onset  . Colon cancer    . Stroke    . Cancer Other     colon  . Stroke Other     History   Social History  . Marital Status: Married    Spouse Name: N/A    Number of Children: 3  . Years of Education: N/A   Occupational History  . Retired Agricultural consultant    Social History Main Topics  . Smoking status: Former Smoker -- 0.75 packs/day for 24 years    Types: Cigarettes    Quit date: 01/11/1962  . Smokeless tobacco: Never Used  . Alcohol Use: No  . Drug Use: No  . Sexual Activity: No   Other Topics Concern  . Not on file   Social History Narrative   Lives at his farm outside of [place]...does some work   Lives with his wife, been married to her for 59 years,   Smoked until 1964 about 7 cigarettes a day for 30 years   No alcohol history.   No drug history                Review of Systems No fever No nausea Appetite is fine Energy level is down for 2-3 days    Objective:   Physical Exam  Constitutional: He appears well-nourished. No distress.  Skin:  Slight redness in lower posterior left calf No open areas No sig warmth Mild tenderness          Assessment & Plan:

## 2013-08-01 NOTE — Assessment & Plan Note (Signed)
Mild but may be because it responded quickly to 1 dose of left over cephalexin he had Will extend course for 5 days Continue elevation and support socks

## 2013-08-03 ENCOUNTER — Encounter: Payer: Self-pay | Admitting: Nurse Practitioner

## 2013-08-03 ENCOUNTER — Other Ambulatory Visit (HOSPITAL_BASED_OUTPATIENT_CLINIC_OR_DEPARTMENT_OTHER): Payer: Medicare Other

## 2013-08-03 ENCOUNTER — Ambulatory Visit (HOSPITAL_BASED_OUTPATIENT_CLINIC_OR_DEPARTMENT_OTHER): Payer: Medicare Other | Admitting: Nurse Practitioner

## 2013-08-03 VITALS — BP 143/44 | HR 52 | Temp 97.8°F | Resp 18 | Ht 70.0 in | Wt 194.3 lb

## 2013-08-03 DIAGNOSIS — L03119 Cellulitis of unspecified part of limb: Secondary | ICD-10-CM

## 2013-08-03 DIAGNOSIS — R161 Splenomegaly, not elsewhere classified: Secondary | ICD-10-CM

## 2013-08-03 DIAGNOSIS — L02419 Cutaneous abscess of limb, unspecified: Secondary | ICD-10-CM

## 2013-08-03 DIAGNOSIS — L03116 Cellulitis of left lower limb: Secondary | ICD-10-CM

## 2013-08-03 DIAGNOSIS — R5383 Other fatigue: Secondary | ICD-10-CM

## 2013-08-03 DIAGNOSIS — C148 Malignant neoplasm of overlapping sites of lip, oral cavity and pharynx: Secondary | ICD-10-CM

## 2013-08-03 DIAGNOSIS — R5381 Other malaise: Secondary | ICD-10-CM

## 2013-08-03 DIAGNOSIS — N289 Disorder of kidney and ureter, unspecified: Secondary | ICD-10-CM | POA: Insufficient documentation

## 2013-08-03 DIAGNOSIS — N189 Chronic kidney disease, unspecified: Secondary | ICD-10-CM

## 2013-08-03 DIAGNOSIS — D696 Thrombocytopenia, unspecified: Secondary | ICD-10-CM

## 2013-08-03 LAB — CBC WITH DIFFERENTIAL/PLATELET
BASO%: 0.6 % (ref 0.0–2.0)
Basophils Absolute: 0 10*3/uL (ref 0.0–0.1)
EOS%: 2.3 % (ref 0.0–7.0)
Eosinophils Absolute: 0 10*3/uL (ref 0.0–0.5)
HCT: 30.7 % — ABNORMAL LOW (ref 38.4–49.9)
HGB: 9.9 g/dL — ABNORMAL LOW (ref 13.0–17.1)
LYMPH%: 20.1 % (ref 14.0–49.0)
MCH: 28.5 pg (ref 27.2–33.4)
MCHC: 32.3 g/dL (ref 32.0–36.0)
MCV: 88.3 fL (ref 79.3–98.0)
MONO#: 0.1 10*3/uL (ref 0.1–0.9)
MONO%: 6.8 % (ref 0.0–14.0)
NEUT#: 1.3 10*3/uL — ABNORMAL LOW (ref 1.5–6.5)
NEUT%: 70.2 % (ref 39.0–75.0)
PLATELETS: 46 10*3/uL — AB (ref 140–400)
RBC: 3.48 10*6/uL — AB (ref 4.20–5.82)
RDW: 18 % — ABNORMAL HIGH (ref 11.0–14.6)
WBC: 1.9 10*3/uL — ABNORMAL LOW (ref 4.0–10.3)
lymph#: 0.4 10*3/uL — ABNORMAL LOW (ref 0.9–3.3)

## 2013-08-03 LAB — COMPREHENSIVE METABOLIC PANEL (CC13)
ALBUMIN: 3.6 g/dL (ref 3.5–5.0)
ALT: 41 U/L (ref 0–55)
ANION GAP: 9 meq/L (ref 3–11)
AST: 45 U/L — AB (ref 5–34)
Alkaline Phosphatase: 89 U/L (ref 40–150)
BUN: 38.6 mg/dL — AB (ref 7.0–26.0)
CALCIUM: 9.3 mg/dL (ref 8.4–10.4)
CO2: 23 mEq/L (ref 22–29)
CREATININE: 2.1 mg/dL — AB (ref 0.7–1.3)
Chloride: 105 mEq/L (ref 98–109)
Glucose: 164 mg/dl — ABNORMAL HIGH (ref 70–140)
Potassium: 4.6 mEq/L (ref 3.5–5.1)
SODIUM: 138 meq/L (ref 136–145)
Total Bilirubin: 0.62 mg/dL (ref 0.20–1.20)
Total Protein: 6.7 g/dL (ref 6.4–8.3)

## 2013-08-03 NOTE — Assessment & Plan Note (Signed)
Patient was diagnosed Saturday, 08/01/2013 with left lower extremity cellulitis her history her provider.  He was initiated on Keflex at that time.  He states that his cellulitis symptoms haven't really improved since initiating the antibiotics.  Patient was advised to continue/finish all of his antibiotics as previously directed.  The patient is also advised to call if his left lower extremity complaints do not completely resolve.

## 2013-08-03 NOTE — Assessment & Plan Note (Signed)
Patient has a history of chronic mild splenomegaly; and some mild tenderness to his left upper quadrant region.  Patient states that his left upper quadrant tenderness is stable at this time.

## 2013-08-03 NOTE — Assessment & Plan Note (Signed)
Patient and his wife both state that patient has been suffering with worsening fatigue issues since his bacterium unit in November 2014.  Patient does try to remain active on a daily basis; work in his garden.  Encouraged patient to increase his activity level is much as possible.  Patient may also benefit from physical therapy in the future as well.

## 2013-08-03 NOTE — Progress Notes (Signed)
[name]   Chief Complaint  Patient presents with  . Fatigue  . Cellulitis    HPI: [name] 80 y.o. 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  Has also been diagnosed in the past with throat cancer in the 1980s.  Patient was diagnosed with bacteremia in November 2014; and states that he has been suffering with increased fatigue  Since that he has tried to remain as active as possible; a working in his garden on a daily basis.  He states that his fatigue has steadily been increasing for the past week or so.  He denies any recent fever or chills.  He just noticed Friday evening that he had some erythema, edema, warm, and tenderness to his entire lower left leg.  He presented to his primary care provider Saturday, 08/01/2013 for further evaluation.  He was diagnosed with a mild left lower extremity cellulitis at that time; and was prescribed Keflex.  Patient states that his left lower leg cellulitis has greatly improved since initiating his antibiotics.  He continues to wear support stockings at both legs on a daily basis as well.    HPI  CURRENT THERAPY: No active treatment plan for patient.   Review of Systems  Constitutional: Positive for malaise/fatigue. Negative for fever, chills and diaphoresis.  HENT:       Speaks with a horse voice as baseline since his throat cancer dx and tx.  Respiratory: Negative for cough and shortness of breath.   Cardiovascular: Negative for chest pain.  Gastrointestinal: Positive for abdominal pain. Negative for nausea, vomiting, constipation and blood in stool.       Hx of chronic LUQ tenderness due to chronic splenomegaly.   Genitourinary: Negative for dysuria, urgency, frequency, hematuria and flank pain.  Musculoskeletal: Negative for back pain and myalgias.  Skin: Negative for itching and rash.       Diagnosed with left lower  extremity cellulitis this weekend.   Neurological: Positive for tremors. Negative for weakness.  Endo/Heme/Allergies: Bruises/bleeds easily.       Hx of easily bruising due to chronic thrombocytopenia.    Past Medical History  Diagnosis Date  . Stroke   . Atrial fibrillation     a. amiodarone therapy; not felt to be a candidate for anticoagulation - bruises easily.  . Bradycardia     a. only tolerates metoprolol 12.5mg  daily (bradycardic w/ bid dosing).  . Aortic stenosis     a. Previously severe -> s/p minimally invasive tissue AVR with Dr. Evelina Dun at [facility] 01/2011 (pre-AVR cath with no obs CAD);  b. 12/2012 TEE: EF 60-65%, no veg, mild AS, triv AI.  [name] Kitchen Hypertension   . Anemia   . Action tremor   . Thrombocytopenia     Dr. Benay Spice  . Colon polyp   . Anxiety   . Splenomegaly   . Gout   . GERD (gastroesophageal reflux disease)   . Hypothyroidism   . Leukopenia     Chronic pancytopenia  . Degenerative disc disease   . Joint effusion, knee     left knee  . Synovial cyst of popliteal space   . Cellulitis of left leg 10/11-16/2012  . CKD (chronic kidney disease), stage III   . Chronic diastolic CHF (congestive heart failure)     a. 12/15/2012 TEE: EF 60-65%, no veg.  . Bacteremia     a. 12/2012 - S bovis;  b. 12/2012 TEE w/o  veg;  c. Seeing ID->Rocephin therapy extended to 01/25/2013 via PICC for possible endocarditis (No veg on TEE).  . High cholesterol   . Heart murmur   . Diabetes mellitus     "borderline" (01/05/2013)  . History of blood transfusion 01/2011; 11/2012    "related to heart valve replaced; low HgB count" (01/05/2013)  . H/O hiatal hernia   . Throat cancer     s/p lasered  . Diverticulosis     Past Surgical History  Procedure Laterality Date  . Cholecystectomy    . Joint effusion      left knee  . Aortic valve replacement  01/23/2011    via minimally invasive approach per Dr Evelina Dun, [facility]  . Tee without cardioversion N/A 12/15/2012    Procedure:  TRANSESOPHAGEAL ECHOCARDIOGRAM (TEE);  Surgeon: [name], MD;  Location: [facility] ENDOSCOPY;  Service: Cardiovascular;  Laterality: N/A;  . Cardiac valve replacement    . Inguinal hernia repair Right   . Excisional hemorrhoidectomy    . Cardiac catheterization    . Surgery scrotal / testicular      "removed one" (01/05/2013)  . Microlaryngoscopy with co2 laser and excision of vocal cord lesion  1980's    "throat cancer on his vocal cord; had it lasered; never had chemo; later had to laser off the scar tissue"    has DIABETES MELLITUS, CONTROLLED; GOUT; THROMBOCYTOPENIA; ANXIETY; ACTION TREMOR; HYPERTENSION; CAD, NATIVE VESSEL; AORTIC STENOSIS; CVA; GERD; DEGENERATIVE DISC DISEASE; POPLITEAL CYST, RIGHT; Splenomegaly; HYPERGLYCEMIA, FASTING; HYPERURICEMIA, ASYMPTOMATIC; Other pancytopenia; Hypothyroidism; Restless legs; Sinus bradycardia; S/P AVR; Protein-calorie malnutrition, severe; Bacteremia; Aortic atherosclerosis; PAF (paroxysmal atrial fibrillation); Lower extremity edema; CKD (chronic kidney disease), stage III; Cellulitis of leg, left; Fatigue; and Renal insufficiency on his problem list.     is allergic to penicillins and neomycin-bacitracin zn-polymyx.    Medication List       This list is accurate as of: 08/03/13 10:03 AM.  Always use your most recent med list.               allopurinol 100 MG tablet  Commonly known as:  ZYLOPRIM  Take 1 tablet (100 mg total) by mouth daily.     amiodarone 200 MG tablet  Commonly known as:  PACERONE  Take 1 tablet (200 mg total) by mouth daily.     amLODipine 5 MG tablet  Commonly known as:  NORVASC  Take 1 tablet (5 mg total) by mouth daily.     aspirin 325 MG tablet  Take 325 mg by mouth daily.     cephALEXin 500 MG capsule  Commonly known as:  KEFLEX  Take 1 capsule (500 mg total) by mouth 3 (three) times daily.     colchicine 0.6 MG tablet  Take 0.6 mg by mouth daily as needed (gout flares). Take one tablet by mouth daily  during an acute episode of gout.     fish oil-omega-3 fatty acids 1000 MG capsule  Take 1 g by mouth daily.     Flax Seed Oil 1000 MG Caps  Take 1 capsule by mouth daily.     furosemide 40 MG tablet  Commonly known as:  LASIX  Take 2 tablets (80 mg total) by mouth daily.     levothyroxine 25 MCG tablet  Commonly known as:  LEVOTHROID  Take 1 tablet (25 mcg total) by mouth daily.     multivitamin tablet  Take 1 tablet by mouth daily.     omeprazole 20 MG capsule  Commonly known as:  PRILOSEC  Take 1 capsule (20 mg total) by mouth daily.     potassium chloride 10 MEQ tablet  Commonly known as:  K-DUR  Take 1 tablet (10 mEq total) by mouth daily.     propranolol 10 MG tablet  Commonly known as:  INDERAL  Take 1 tablet (10 mg total) by mouth 2 (two) times daily.     vitamin C 500 MG tablet  Commonly known as:  ASCORBIC ACID  Take 1 tablet (500 mg total) by mouth daily.     Vitamin D 1000 UNITS capsule  Take 1 capsule (1,000 Units total) by mouth daily.         PHYSICAL EXAMINATION  Blood pressure 143/44, pulse 52, temperature 97.8 F (36.6 C), temperature source Oral, resp. rate 18, height 5\' 10"  (1.778 m), weight 194 lb 4.8 oz (88.134 kg).  Physical Exam  Constitutional: He is oriented to person, place, and time and well-developed, well-nourished, and in no distress.  HENT:  Mouth/Throat: Oropharyngeal exudate present.  Eyes: Conjunctivae are normal. Pupils are equal, round, and reactive to light.  Neck: Normal range of motion. Neck supple.  Cardiovascular: Normal rate, regular rhythm, normal heart sounds and intact distal pulses.  Exam reveals no friction rub.   No murmur heard. Pulmonary/Chest: Effort normal and breath sounds normal. No respiratory distress. He exhibits no tenderness.  Abdominal: Soft. Bowel sounds are normal. He exhibits no mass. There is tenderness. There is no rebound and no guarding.  LUQ mild splenomegaly noted; with slight tenderness with  palpation to site.   Musculoskeletal: Normal range of motion. He exhibits edema. He exhibits no tenderness.  Left lower extremity with trace erythema to left lower leg.  Also noted to have slight edema to left ankle only. No specific calf tenderness on exam. No warmth, no red streaks noted on exam.   Neurological: He is alert and oriented to person, place, and time. Gait normal.  Skin: Skin is warm and dry.  Psychiatric: Affect normal.    LABORATORY DATA:. CBC  Lab Results  Component Value Date   WBC 1.9* 08/03/2013   RBC 3.48* 08/03/2013   HGB 9.9* 08/03/2013   HCT 30.7* 08/03/2013   PLT 46* 08/03/2013   MCV 88.3 08/03/2013   MCH 28.5 08/03/2013   MCHC 32.3 08/03/2013   RDW 18.0* 08/03/2013   LYMPHSABS 0.4* 08/03/2013   MONOABS 0.1 08/03/2013   EOSABS 0.0 08/03/2013   BASOSABS 0.0 08/03/2013     CMET  Lab Results  Component Value Date   NA 138 08/03/2013   K 4.6 08/03/2013   CL 101 06/25/2013   CO2 23 08/03/2013   GLUCOSE 164* 08/03/2013   BUN 38.6* 08/03/2013   CREATININE 2.1* 08/03/2013   CALCIUM 9.3 08/03/2013   PROT 6.7 08/03/2013   ALBUMIN 3.6 08/03/2013   AST 45* 08/03/2013   ALT 41 08/03/2013   ALKPHOS 89 08/03/2013   BILITOT 0.62 08/03/2013   GFRNONAA 32* 01/05/2013   GFRAA 37* 01/05/2013      RADIOGRAPHIC STUDIES:  ASSESSMENT/PLAN:    Splenomegaly  Assessment & Plan Patient has a history of chronic mild splenomegaly; and some mild tenderness to his left upper quadrant region.  Patient states that his left upper quadrant tenderness is stable at this time.   Cellulitis of leg, left  Assessment & Plan Patient was diagnosed Saturday, 08/01/2013 with left lower extremity cellulitis her history her provider.  He was initiated on Keflex at that time.  He states that his cellulitis symptoms haven't really improved since initiating the antibiotics.  Patient was advised to continue/finish all of his antibiotics as previously directed.  The patient is also advised to call if his  left lower extremity complaints do not completely resolve.   Fatigue  Assessment & Plan Patient and his wife both state that patient has been suffering with worsening fatigue issues since his bacterium unit in November 2014.  Patient does try to remain active on a daily basis; work in his garden.  Encouraged patient to increase his activity level is much as possible.  Patient may also benefit from physical therapy in the future as well.   Renal insufficiency  Assessment & Plan Patient has a history of chronic renal insufficiency.  Creatinine today was 2.1.   Patient stated understanding of all instructions; and was in agreement with this plan of care. The patient knows to call the clinic with any problems, questions or concerns.   Review/collaboration with Dr. Benay Spice regarding all aspects of patient's visit today.   Total time spent with patient was 25 minutes;  with greater than 75 percent of that time spent in face to face counseling regarding his symptoms, reviewing his lab results,  and coordination of care and follow up.  Disclaimer: This note was dictated with voice recognition software. Similar sounding words can inadvertently be transcribed and may not be corrected upon review.   Drue Second, NP 08/03/2013

## 2013-08-03 NOTE — Assessment & Plan Note (Signed)
Patient has a history of chronic renal insufficiency.  Creatinine today was 2.1.

## 2013-09-03 ENCOUNTER — Other Ambulatory Visit (HOSPITAL_BASED_OUTPATIENT_CLINIC_OR_DEPARTMENT_OTHER): Payer: Medicare Other

## 2013-09-03 ENCOUNTER — Telehealth: Payer: Self-pay | Admitting: Oncology

## 2013-09-03 ENCOUNTER — Ambulatory Visit (HOSPITAL_BASED_OUTPATIENT_CLINIC_OR_DEPARTMENT_OTHER): Payer: Medicare Other | Admitting: Oncology

## 2013-09-03 VITALS — BP 163/54 | HR 48 | Temp 97.5°F | Resp 20 | Ht 70.0 in | Wt 196.9 lb

## 2013-09-03 DIAGNOSIS — D649 Anemia, unspecified: Secondary | ICD-10-CM

## 2013-09-03 DIAGNOSIS — D696 Thrombocytopenia, unspecified: Secondary | ICD-10-CM

## 2013-09-03 DIAGNOSIS — R161 Splenomegaly, not elsewhere classified: Secondary | ICD-10-CM

## 2013-09-03 DIAGNOSIS — D61818 Other pancytopenia: Secondary | ICD-10-CM

## 2013-09-03 DIAGNOSIS — I4891 Unspecified atrial fibrillation: Secondary | ICD-10-CM

## 2013-09-03 DIAGNOSIS — E119 Type 2 diabetes mellitus without complications: Secondary | ICD-10-CM

## 2013-09-03 DIAGNOSIS — N289 Disorder of kidney and ureter, unspecified: Secondary | ICD-10-CM

## 2013-09-03 LAB — CBC WITH DIFFERENTIAL/PLATELET
BASO%: 0 % (ref 0.0–2.0)
BASOS ABS: 0 10*3/uL (ref 0.0–0.1)
EOS%: 2.4 % (ref 0.0–7.0)
Eosinophils Absolute: 0 10*3/uL (ref 0.0–0.5)
HCT: 29.5 % — ABNORMAL LOW (ref 38.4–49.9)
HEMOGLOBIN: 9.5 g/dL — AB (ref 13.0–17.1)
LYMPH#: 0.3 10*3/uL — AB (ref 0.9–3.3)
LYMPH%: 19.5 % (ref 14.0–49.0)
MCH: 28.4 pg (ref 27.2–33.4)
MCHC: 32.2 g/dL (ref 32.0–36.0)
MCV: 88.1 fL (ref 79.3–98.0)
MONO#: 0.1 10*3/uL (ref 0.1–0.9)
MONO%: 8.5 % (ref 0.0–14.0)
NEUT#: 1.1 10*3/uL — ABNORMAL LOW (ref 1.5–6.5)
NEUT%: 69.6 % (ref 39.0–75.0)
NRBC: 0 % (ref 0–0)
Platelets: 32 10*3/uL — ABNORMAL LOW (ref 140–400)
RBC: 3.35 10*6/uL — AB (ref 4.20–5.82)
RDW: 16.8 % — AB (ref 11.0–14.6)
WBC: 1.6 10*3/uL — ABNORMAL LOW (ref 4.0–10.3)

## 2013-09-03 NOTE — Progress Notes (Signed)
  [place] OFFICE PROGRESS NOTE   Diagnosis: Pancytopenia  INTERVAL HISTORY:   He returns as scheduled. He reports malaise, but he is able to mow his yard and works in his garden. No bleeding other than easy bruising. He was treated for left leg cellulitis last month.   Objective:  Vital signs in last 24 hours:  Blood pressure 163/54, pulse 48, temperature 97.5 F (36.4 C), temperature source Oral, resp. rate 20, height 5\' 10"  (1.778 m), weight 196 lb 14.4 oz (89.313 kg).   Resp: Lungs clear bilaterally Cardio: Systolic and diastolic murmurs GI: No hepatomegaly, fullness in the left abdomen without a discrete spleen tip Vascular: Trace edema at the lower leg bilaterally  Skin: Chronic purpura over the arms and legs     Lab Results:  Lab Results  Component Value Date   WBC 1.6* 09/03/2013   HGB 9.5* 09/03/2013   HCT 29.5* 09/03/2013   MCV 88.1 09/03/2013   PLT 32* 09/03/2013   NEUTROABS 1.1* 09/03/2013    Medications: I have reviewed the patient's current medications.  Assessment/Plan: 1. Chronic pancytopenia/splenomegaly-likely related to a chronic lymphoproliferative disorder,? Splenic lymphoma  2. History of severe aortic stenosis, status post aortic valve replacement surgery at [facility] on 01/23/2011  3. History of gout  4. Diabetes  5. Streptococcus bacteremia-TEE negative for endocarditis  6. Malaise-likely multifactorial 7. Renal insufficiency  8. Paroxysmal atrial fibrillation  9. Colon polyps noted on a virtual colonoscopy 02/10/2013   Disposition:  He appears stable. I suspect the malaise is related to his age and multiple comorbid conditions. The anemia may be contributing, but the hemoglobin is stable. He is able to go about his activities at home. He will contact us for increased malaise or dyspnea. Mr. Cirelli will return for an office and lab visit in 3 months. He will discuss the indication for continuing aspirin with his cardiologist next  month. The combination of aspirin and severe thrombocytopenia increases his bleeding risk.  Betsy Coder, MD  09/03/2013  4:15 PM

## 2013-09-03 NOTE — Telephone Encounter (Signed)
gv adn printed appt sched and avs for pt for NOV °

## 2013-09-03 NOTE — Patient Instructions (Signed)
Please provide office with a copy of your Advanced Directive/Living Will at your next opportunity so it can be scanned into chart.

## 2013-09-15 ENCOUNTER — Other Ambulatory Visit: Payer: Self-pay

## 2013-09-15 MED ORDER — PROPRANOLOL HCL 10 MG PO TABS: 10.0000 mg | ORAL_TABLET | Freq: Two times a day (BID) | ORAL | Status: DC

## 2013-09-18 ENCOUNTER — Other Ambulatory Visit: Payer: Self-pay | Admitting: *Deleted

## 2013-09-18 MED ORDER — LEVOTHYROXINE SODIUM 25 MCG PO TABS: 25.0000 ug | ORAL_TABLET | Freq: Every day | ORAL | Status: DC

## 2013-09-21 ENCOUNTER — Ambulatory Visit (INDEPENDENT_AMBULATORY_CARE_PROVIDER_SITE_OTHER): Payer: Medicare Other | Admitting: *Deleted

## 2013-09-21 ENCOUNTER — Ambulatory Visit (HOSPITAL_COMMUNITY): Payer: Medicare Other | Attending: Cardiology

## 2013-09-21 DIAGNOSIS — I059 Rheumatic mitral valve disease, unspecified: Secondary | ICD-10-CM | POA: Insufficient documentation

## 2013-09-21 DIAGNOSIS — I2510 Atherosclerotic heart disease of native coronary artery without angina pectoris: Secondary | ICD-10-CM

## 2013-09-21 DIAGNOSIS — IMO0002 Reserved for concepts with insufficient information to code with codable children: Secondary | ICD-10-CM | POA: Diagnosis not present

## 2013-09-21 DIAGNOSIS — I480 Paroxysmal atrial fibrillation: Secondary | ICD-10-CM

## 2013-09-21 DIAGNOSIS — I10 Essential (primary) hypertension: Secondary | ICD-10-CM | POA: Diagnosis not present

## 2013-09-21 DIAGNOSIS — K219 Gastro-esophageal reflux disease without esophagitis: Secondary | ICD-10-CM | POA: Insufficient documentation

## 2013-09-21 DIAGNOSIS — M109 Gout, unspecified: Secondary | ICD-10-CM | POA: Insufficient documentation

## 2013-09-21 DIAGNOSIS — I4891 Unspecified atrial fibrillation: Secondary | ICD-10-CM | POA: Diagnosis not present

## 2013-09-21 DIAGNOSIS — N182 Chronic kidney disease, stage 2 (mild): Secondary | ICD-10-CM | POA: Diagnosis not present

## 2013-09-21 DIAGNOSIS — E039 Hypothyroidism, unspecified: Secondary | ICD-10-CM | POA: Insufficient documentation

## 2013-09-21 DIAGNOSIS — E119 Type 2 diabetes mellitus without complications: Secondary | ICD-10-CM | POA: Diagnosis not present

## 2013-09-21 DIAGNOSIS — I498 Other specified cardiac arrhythmias: Secondary | ICD-10-CM | POA: Insufficient documentation

## 2013-09-21 DIAGNOSIS — Z8673 Personal history of transient ischemic attack (TIA), and cerebral infarction without residual deficits: Secondary | ICD-10-CM | POA: Insufficient documentation

## 2013-09-21 DIAGNOSIS — I359 Nonrheumatic aortic valve disorder, unspecified: Secondary | ICD-10-CM | POA: Diagnosis present

## 2013-09-21 LAB — LIPID PANEL
CHOLESTEROL: 84 mg/dL (ref 0–200)
HDL: 25.6 mg/dL — ABNORMAL LOW (ref >39.00)
LDL Cholesterol: 40 mg/dL (ref 0–99)
NonHDL: 58.4
TRIGLYCERIDES: 94 mg/dL (ref 0.0–149.0)
Total CHOL/HDL Ratio: 3
VLDL: 18.8 mg/dL (ref 0.0–40.0)

## 2013-09-21 LAB — BASIC METABOLIC PANEL
BUN: 32 mg/dL — ABNORMAL HIGH (ref 6–23)
CHLORIDE: 107 meq/L (ref 96–112)
CO2: 25 meq/L (ref 19–32)
Calcium: 8.8 mg/dL (ref 8.4–10.5)
Creatinine, Ser: 2.1 mg/dL — ABNORMAL HIGH (ref 0.4–1.5)
GFR: 32.62 mL/min — ABNORMAL LOW (ref >60.00)
GLUCOSE: 149 mg/dL — AB (ref 70–99)
Potassium: 4 mEq/L (ref 3.5–5.1)
SODIUM: 141 meq/L (ref 135–145)

## 2013-09-21 LAB — HEPATIC FUNCTION PANEL
ALK PHOS: 80 U/L (ref 39–117)
ALT: 25 U/L (ref 0–53)
AST: 34 U/L (ref 0–37)
Albumin: 3.6 g/dL (ref 3.5–5.2)
BILIRUBIN DIRECT: 0.1 mg/dL (ref 0.0–0.3)
TOTAL PROTEIN: 6.7 g/dL (ref 6.0–8.3)
Total Bilirubin: 0.8 mg/dL (ref 0.2–1.2)

## 2013-09-21 LAB — TSH: TSH: 4.94 u[IU]/mL — ABNORMAL HIGH (ref 0.35–4.50)

## 2013-09-21 LAB — T4, FREE: FREE T4: 1.23 ng/dL (ref 0.60–1.60)

## 2013-09-21 NOTE — Progress Notes (Signed)
2D Echo completed. 09/21/2013

## 2013-09-30 ENCOUNTER — Encounter: Payer: Self-pay | Admitting: Cardiovascular Disease

## 2013-09-30 ENCOUNTER — Ambulatory Visit (INDEPENDENT_AMBULATORY_CARE_PROVIDER_SITE_OTHER): Payer: Medicare Other | Admitting: Cardiovascular Disease

## 2013-09-30 VITALS — BP 168/54 | HR 51 | Ht 70.0 in | Wt 197.8 lb

## 2013-09-30 DIAGNOSIS — I359 Nonrheumatic aortic valve disorder, unspecified: Secondary | ICD-10-CM

## 2013-09-30 DIAGNOSIS — I700 Atherosclerosis of aorta: Secondary | ICD-10-CM

## 2013-09-30 MED ORDER — FUROSEMIDE 80 MG PO TABS: 80.0000 mg | ORAL_TABLET | Freq: Two times a day (BID) | ORAL | Status: DC
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
## 2013-09-30 NOTE — Patient Instructions (Signed)
Your physician has recommended you make the following change in your medication:  1) INCREASE Lasix to 80 mg twice daily  Your physician recommends that you return for lab work in: 3 months a couple of days before your appt with [facility] physician recommends that you schedule a follow-up appointment in: 3 months  Your have a appointment with Dr.Hopper tomorrow 10/01/13 @9 :30am

## 2013-10-01 ENCOUNTER — Ambulatory Visit (INDEPENDENT_AMBULATORY_CARE_PROVIDER_SITE_OTHER): Payer: Medicare Other | Admitting: Internal Medicine

## 2013-10-01 ENCOUNTER — Encounter: Payer: Self-pay | Admitting: Internal Medicine

## 2013-10-01 VITALS — BP 168/66 | HR 57 | Temp 97.5°F | Wt 199.5 lb

## 2013-10-01 DIAGNOSIS — F32A Depression, unspecified: Secondary | ICD-10-CM

## 2013-10-01 DIAGNOSIS — Z23 Encounter for immunization: Secondary | ICD-10-CM

## 2013-10-01 DIAGNOSIS — F3289 Other specified depressive episodes: Secondary | ICD-10-CM

## 2013-10-01 DIAGNOSIS — F329 Major depressive disorder, single episode, unspecified: Secondary | ICD-10-CM

## 2013-10-01 MED ORDER — SERTRALINE HCL 50 MG PO TABS: ORAL_TABLET | ORAL | Status: DC
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
## 2013-10-01 NOTE — Progress Notes (Signed)
Pre visit review using our clinic review tool, if applicable. No additional management support is needed unless otherwise documented below in the visit note. 

## 2013-10-01 NOTE — Patient Instructions (Signed)
As we discussed the neurotransmitters are essential for good brain function, both intellectually & emotionally. The agents to increase the neurotransmitter levels are not addictive and simply keep this essential neurotransmitter at therapeutic levels. If these levels become severely depleted; depression or panic attacks can occur.

## 2013-10-02 DIAGNOSIS — F32A Depression, unspecified: Secondary | ICD-10-CM | POA: Insufficient documentation

## 2013-10-02 DIAGNOSIS — F329 Major depressive disorder, single episode, unspecified: Secondary | ICD-10-CM | POA: Insufficient documentation

## 2013-10-03 ENCOUNTER — Encounter: Payer: Self-pay | Admitting: Cardiovascular Disease

## 2013-10-05 ENCOUNTER — Other Ambulatory Visit: Payer: Self-pay | Admitting: *Deleted

## 2013-10-05 MED ORDER — POTASSIUM CHLORIDE ER 10 MEQ PO TBCR: 10.0000 meq | EXTENDED_RELEASE_TABLET | Freq: Every day | ORAL | Status: DC

## 2013-10-12 ENCOUNTER — Ambulatory Visit (INDEPENDENT_AMBULATORY_CARE_PROVIDER_SITE_OTHER): Payer: Medicare Other | Admitting: Internal Medicine

## 2013-10-12 ENCOUNTER — Encounter: Payer: Self-pay | Admitting: Internal Medicine

## 2013-10-12 VITALS — BP 146/54 | HR 57 | Temp 98.0°F | Wt 198.2 lb

## 2013-10-12 DIAGNOSIS — F329 Major depressive disorder, single episode, unspecified: Secondary | ICD-10-CM

## 2013-10-12 DIAGNOSIS — F32A Depression, unspecified: Secondary | ICD-10-CM

## 2013-10-12 DIAGNOSIS — Z23 Encounter for immunization: Secondary | ICD-10-CM

## 2013-10-12 NOTE — Progress Notes (Signed)
   Subjective:    Patient ID: [name], male    DOB: 12/28/1922, 80 y.o.   MRN: [id number]  HPI  Patient is here today for follow up of depression.   He had suicidal ideation 9/18-9/21/15. These thoughts resolved, but he had residual depression.   Sertraline 50mg  1/2 tablet daily started at office visit 10/01/13. He reports compliance with medications.   He states that he is sleeping better (now on average 6 hours/night).  Appetite is good.  He does report feeling more nervous at the end of the day and resting tremor worsens.    He has not had any more suicidal ideation.     Review of Systems  Constitutional: Negative for fever, chills, appetite change and unexpected weight change.  Respiratory: Negative for choking, chest tightness, shortness of breath and wheezing.   Cardiovascular: Positive for leg swelling (chronic bilateral LE edema). Negative for chest pain and palpitations.  Gastrointestinal: Negative for nausea, vomiting, diarrhea and constipation.  Neurological: Negative for dizziness, syncope, weakness and headaches.       Objective:   Physical Exam  Constitutional: He is oriented to person, place, and time. He appears well-developed and well-nourished. No distress.  HENT:  Head: Normocephalic and atraumatic.  Neck: Normal range of motion. Neck supple. No thyromegaly present.  Cardiovascular: Normal rate and regular rhythm.   Murmur (1.1-5 systolic murmur) heard. Pulmonary/Chest: Effort normal and breath sounds normal. No respiratory distress. He has no wheezes. He exhibits no tenderness.  Abdominal: Soft. Bowel sounds are normal.  Ventral hernia  Musculoskeletal: Normal range of motion.  Lymphadenopathy:    He has no cervical adenopathy.  Neurological: He is alert and oriented to person, place, and time.  Skin: Skin is warm and dry. He is not diaphoretic.  Hyperpigmentation of bilateral arms  Psychiatric: He has a normal mood and affect. His behavior is  normal. Judgment and thought content normal.       Assessment & Plan:

## 2013-10-12 NOTE — Progress Notes (Signed)
Pre visit review using our clinic review tool, if applicable. No additional management support is needed unless otherwise documented below in the visit note. 

## 2013-10-12 NOTE — Patient Instructions (Signed)
Please increase the sertraline to 50 mg daily

## 2013-10-12 NOTE — Progress Notes (Signed)
   Subjective:    Patient ID: [name], male    DOB: 10/25/1922, 80 y.o.   MRN: [id number]  HPI   He is here for followup after starting the sertraline 50 mg one half pill daily. He has had no additional suicidal ideation but has some residual depression.  He is sleeping better, up to 6 hours per night. Appetite is good.  He describes some anxiety in the day. He also has an intention tremor which is worse by end of day.    Review of Systems  He denies paroxysmal flushing, headache, chest pain, hypertension.  He has no active GI symptoms .     Objective:   Physical Exam   Affect is slightly flat but he is interactive.  Heart rhythm is regular; there is a grade 4.4-9 systolic murmur with carotid radiation.  There is no increased work of breathing.  Deep tendon reflexes are symmetric and 1/2+.  He does exhibit tremor worse in the left upper extremity than the right.  He has tense edema despite support hose.        Assessment & Plan:  #1 depression, improved  Sertraline will be increased to 50 mg daily. The pathophysiology of neurotransmitter deficiency was reviewed.

## 2013-10-12 NOTE — Assessment & Plan Note (Signed)
Sertraline increased to 50 mg daily

## 2013-10-13 ENCOUNTER — Other Ambulatory Visit: Payer: Self-pay | Admitting: *Deleted

## 2013-10-13 MED ORDER — AMLODIPINE BESYLATE 5 MG PO TABS: 5.0000 mg | ORAL_TABLET | Freq: Every day | ORAL | Status: DC

## 2013-10-13 MED ORDER — FUROSEMIDE 80 MG PO TABS: 80.0000 mg | ORAL_TABLET | Freq: Two times a day (BID) | ORAL | Status: DC

## 2013-10-16 ENCOUNTER — Other Ambulatory Visit: Payer: Self-pay

## 2013-10-16 DIAGNOSIS — F329 Major depressive disorder, single episode, unspecified: Secondary | ICD-10-CM

## 2013-10-16 DIAGNOSIS — F32A Depression, unspecified: Secondary | ICD-10-CM

## 2013-10-16 MED ORDER — SERTRALINE HCL 50 MG PO TABS: ORAL_TABLET | ORAL | Status: DC

## 2013-10-16 NOTE — Telephone Encounter (Signed)
Ok #[id number]

## 2013-10-29 ENCOUNTER — Telehealth: Payer: Self-pay | Admitting: *Deleted

## 2013-10-29 NOTE — Telephone Encounter (Signed)
Left  msg on triage stating needing  to verify which mg pt is taking on his sertraline. Pls call back using ref# [id number]. Called [facility] spoke with pharmacist Danton Clap) inform her md increase to 50 mg daily on 10/12/13...Johny Chess

## 2013-11-23 ENCOUNTER — Ambulatory Visit (INDEPENDENT_AMBULATORY_CARE_PROVIDER_SITE_OTHER): Payer: Medicare Other | Admitting: Internal Medicine

## 2013-11-23 ENCOUNTER — Telehealth: Payer: Self-pay | Admitting: Oncology

## 2013-11-23 ENCOUNTER — Encounter: Payer: Self-pay | Admitting: Internal Medicine

## 2013-11-23 ENCOUNTER — Other Ambulatory Visit (HOSPITAL_BASED_OUTPATIENT_CLINIC_OR_DEPARTMENT_OTHER): Payer: Medicare Other

## 2013-11-23 ENCOUNTER — Ambulatory Visit (HOSPITAL_BASED_OUTPATIENT_CLINIC_OR_DEPARTMENT_OTHER): Payer: Medicare Other | Admitting: Oncology

## 2013-11-23 VITALS — BP 150/44 | HR 51 | Temp 97.8°F | Resp 19 | Ht 70.0 in | Wt 193.4 lb

## 2013-11-23 VITALS — BP 132/42 | HR 49 | Temp 98.3°F | Resp 15 | Wt 193.1 lb

## 2013-11-23 DIAGNOSIS — F329 Major depressive disorder, single episode, unspecified: Secondary | ICD-10-CM

## 2013-11-23 DIAGNOSIS — M109 Gout, unspecified: Secondary | ICD-10-CM

## 2013-11-23 DIAGNOSIS — F32A Depression, unspecified: Secondary | ICD-10-CM

## 2013-11-23 DIAGNOSIS — D696 Thrombocytopenia, unspecified: Secondary | ICD-10-CM

## 2013-11-23 DIAGNOSIS — D61818 Other pancytopenia: Secondary | ICD-10-CM

## 2013-11-23 DIAGNOSIS — N289 Disorder of kidney and ureter, unspecified: Secondary | ICD-10-CM

## 2013-11-23 DIAGNOSIS — E119 Type 2 diabetes mellitus without complications: Secondary | ICD-10-CM

## 2013-11-23 LAB — CBC WITH DIFFERENTIAL/PLATELET
BASO%: 0 % (ref 0.0–2.0)
Basophils Absolute: 0 10*3/uL (ref 0.0–0.1)
EOS%: 1.6 % (ref 0.0–7.0)
Eosinophils Absolute: 0 10*3/uL (ref 0.0–0.5)
HCT: 28.2 % — ABNORMAL LOW (ref 38.4–49.9)
HEMOGLOBIN: 9.1 g/dL — AB (ref 13.0–17.1)
LYMPH%: 18.7 % (ref 14.0–49.0)
MCH: 27.7 pg (ref 27.2–33.4)
MCHC: 32.3 g/dL (ref 32.0–36.0)
MCV: 85.7 fL (ref 79.3–98.0)
MONO#: 0.1 10*3/uL (ref 0.1–0.9)
MONO%: 6.7 % (ref 0.0–14.0)
NEUT#: 1.4 10*3/uL — ABNORMAL LOW (ref 1.5–6.5)
NEUT%: 73 % (ref 39.0–75.0)
NRBC: 0 % (ref 0–0)
Platelets: 32 10*3/uL — ABNORMAL LOW (ref 140–400)
RBC: 3.29 10*6/uL — AB (ref 4.20–5.82)
RDW: 16.8 % — AB (ref 11.0–14.6)
WBC: 1.9 10*3/uL — ABNORMAL LOW (ref 4.0–10.3)
lymph#: 0.4 10*3/uL — ABNORMAL LOW (ref 0.9–3.3)

## 2013-11-23 NOTE — Patient Instructions (Addendum)
Please follow-up after your return from [place]. Please report any changes in symptoms.

## 2013-11-23 NOTE — Progress Notes (Signed)
  [place] [facility] OFFICE PROGRESS NOTE   Diagnosis: pancytopenia  INTERVAL HISTORY:   Mr. Zeimet returns as scheduled. No fever, bleeding, or recent infection. No specific complaint.  Objective:  Vital signs in last 24 hours:  Blood pressure 150/44, pulse 51, temperature 97.8 F (36.6 C), temperature source Oral, resp. rate 19, height 5\' 10"  (8.413 m), weight 193 lb 6.4 oz (87.726 kg), SpO2 100 %.    HEENT: neck without mass Lymphatics: no cervical, supraclavicular, or axillary nodes Resp: lungs clear bilaterally Cardio: regular rate and rhythm, 2/6 systolic murmur GI: fullness in the left abdomen without discreet spleen tip, nontender, no hepatomegaly Vascular: trace to 1+ pitting edema at the left greater than right lower leg  Skin:confluent purpura over the lower arms bilaterally    Lab Results:  Lab Results  Component Value Date   WBC 1.9* 11/23/2013   HGB 9.1* 11/23/2013   HCT 28.2* 11/23/2013   MCV 85.7 11/23/2013   PLT 32* 11/23/2013   NEUTROABS 1.4* 11/23/2013    Medications: I have reviewed the patient's current medications.  Assessment/Plan: 1. Chronic pancytopenia/splenomegaly-likely related to a chronic lymphoproliferative disorder,? Splenic lymphoma  2. History of severe aortic stenosis, status post aortic valve replacement surgery at [facility] on 01/23/2011  3. History of gout  4. Diabetes  5. Streptococcus bacteremia-TEE negative for endocarditis  6. Malaise-likely multifactorial 7. Renal insufficiency  8. Paroxysmal atrial fibrillation  9. Colon polyps noted on a virtual colonoscopy 02/10/2013     Disposition:  Mr. Bolander is stable from a hematologic standpoint. He will contact us for bleeding. He will return for an office visit and CBC in 3 months. The plan is to resume nplate if he develops severe thrombocytopenia or bleeding.  Betsy Coder, MD  11/23/2013  9:47 AM

## 2013-11-23 NOTE — Telephone Encounter (Signed)
gave avs & cal for Feb 2016

## 2013-11-23 NOTE — Progress Notes (Signed)
   Subjective:    Patient ID: [name], male    DOB: [birth year], 80 y.o.   MRN: [id number]  HPI  He and his wife feel he is doing extremely well on sertraline 50 mg daily. He is sleeping well and his appetite is described as "too good". He denies any significant depression or suicidal ideation.   He has been compliant with the medicines without adverse effects  He's physically active and is enjoying yardwork and hunting.  They are planning to drive to [place] later this year to visit their adult children.   Review of Systems   He has seen his hematologist for his pancytopenia. White count is 1900, hemoglobin 9.1 hemoglobin and platelet count 32,000. F/U recommended in 3 months      Objective:   Physical Exam   Positive or pertinent findings include: Pattern alopecia is present. He has a Engineering geologist. He is wearing hearing is bilaterally He has a grade 3-6.0 systolic murmur He has tense edema of the lower extremities.  Pedal pulses are present. He has  hyperpigmentation over the forearms.  General appearance :adequately nourished; in no distress. Eyes: No conjunctival inflammation or scleral icterus is present. Heart:  Slow rate and regular rhythm. S1 and S2 normal without gallop, click, rub or other extra sounds   Lungs:Chest clear to auscultation; no wheezes, rhonchi,rales ,or rubs present.No increased work of breathing.  Abdomen: bowel sounds normal, soft and non-tender without masses, organomegaly or hernias noted.  No guarding or rebound.  Vascular : all pulses equal ; no bruits present. Skin:Warm & dry.  Intact without suspicious lesions or rashes ; no jaundice or tenting Lymphatic: No lymphadenopathy is noted about the head, neck, axilla              Assessment & Plan:  #!depression, significant improvement; stable on SSRI  #2 pancytopenia, as per Dr. Benay Spice  Plan follow-up in 3-6 months.

## 2013-11-23 NOTE — Progress Notes (Signed)
Pre visit review using our clinic review tool, if applicable. No additional management support is needed unless otherwise documented below in the visit note. 

## 2013-11-30 ENCOUNTER — Other Ambulatory Visit: Payer: Self-pay

## 2013-11-30 MED ORDER — OMEPRAZOLE 20 MG PO CPDR: 20.0000 mg | DELAYED_RELEASE_CAPSULE | Freq: Every day | ORAL | Status: DC

## 2013-12-07 ENCOUNTER — Other Ambulatory Visit (INDEPENDENT_AMBULATORY_CARE_PROVIDER_SITE_OTHER): Payer: Medicare Other | Admitting: *Deleted

## 2013-12-07 ENCOUNTER — Telehealth: Payer: Self-pay

## 2013-12-07 DIAGNOSIS — D61818 Other pancytopenia: Secondary | ICD-10-CM

## 2013-12-07 LAB — CBC WITH DIFFERENTIAL/PLATELET
BASOS PCT: 0.1 % (ref 0.0–3.0)
Basophils Absolute: 0 10*3/uL (ref 0.0–0.1)
Eosinophils Absolute: 0 10*3/uL (ref 0.0–0.7)
Eosinophils Relative: 1.6 % (ref 0.0–5.0)
HCT: 27.7 % — ABNORMAL LOW (ref 39.0–52.0)
HEMOGLOBIN: 9 g/dL — AB (ref 13.0–17.0)
Lymphocytes Relative: 16.1 % (ref 12.0–46.0)
Lymphs Abs: 0.3 10*3/uL — ABNORMAL LOW (ref 0.7–4.0)
MCHC: 32.6 g/dL (ref 30.0–36.0)
MCV: 85.1 fl (ref 78.0–100.0)
Monocytes Absolute: 0.1 10*3/uL (ref 0.1–1.0)
Monocytes Relative: 6.4 % (ref 3.0–12.0)
NEUTROS ABS: 1.5 10*3/uL (ref 1.4–7.7)
Neutrophils Relative %: 75.8 % (ref 43.0–77.0)
Platelets: 40 10*3/uL — CL (ref 150.0–400.0)
RBC: 3.26 Mil/uL — AB (ref 4.22–5.81)
RDW: 17.6 % — ABNORMAL HIGH (ref 11.5–15.5)

## 2013-12-07 NOTE — Telephone Encounter (Signed)
Phone call from [facility] lab stating patient's platelet count is 40.0.

## 2013-12-07 NOTE — Telephone Encounter (Signed)
See comments on CBC ; essentially stable

## 2013-12-08 ENCOUNTER — Other Ambulatory Visit: Payer: Self-pay | Admitting: *Deleted

## 2013-12-08 MED ORDER — POTASSIUM CHLORIDE ER 10 MEQ PO TBCR: 10.0000 meq | EXTENDED_RELEASE_TABLET | Freq: Every day | ORAL | Status: DC

## 2013-12-09 ENCOUNTER — Other Ambulatory Visit: Payer: Self-pay | Admitting: *Deleted

## 2013-12-09 DIAGNOSIS — D61818 Other pancytopenia: Secondary | ICD-10-CM

## 2013-12-09 NOTE — Progress Notes (Signed)
Re-entered 02/22/14 lab orders that were released by an outside office.

## 2013-12-09 NOTE — Progress Notes (Signed)
Background: The patient is followed for atrial fibrillation, aortic stenosis status post bioprosthetic aortic valve replacement, bradycardia, diastolic heart failure, and coronary artery disease. The patient has not been anticoagulated because of advanced age, recurrent falls, and pancytopenia with platelets less than 50,000. The patient is followed by Dr. Benay Spice for chronic pancytopenia/splenomegaly with suspected chronic lymphoproliferative disorder.  HPI:  80 year old gentleman presenting for follow-up evaluation. He's been doing fairly well over the past few months. Shortness of breath with exertion is unchanged, probably a little better than when I last saw him. He's been out doing some light work in his yard. He denies chest pain or pressure. No orthopnea or PND. Chronic leg swelling is unchanged. He's been compliant with his medication.  Studies:  2-D echocardiogram 09/21/2013: Study Conclusions  - Left ventricle: The cavity size was normal. Wall thickness was increased in a pattern of mild LVH. Systolic function was normal. The estimated ejection fraction was in the range of 60% to 65%. Wall motion was normal; there were no regional wall motion abnormalities. Features are consistent with a pseudonormal left ventricular filling pattern, with concomitant abnormal relaxation and increased filling pressure (grade 2 diastolic dysfunction). - Aortic valve: Bioprosthetic aortic valve. There was probably moderate peri-valvular aortic insufficiency, very eccentric. Moderate elevated mean gradient across the valve. The valve appears to open well. Possibly elevated gradient in the setting of high flow with aortic insufficiency across a relatively small valve. Mean gradient (S): 29 mm Hg. - Aorta: Mildly dilated aortic root. Aortic root dimension: 38 mm (ED). - Mitral valve: Moderately calcified annulus. There was trivial regurgitation. - Left atrium: The atrium  was moderately dilated. - Right ventricle: The cavity size was normal. Systolic function was normal. - Right atrium: The atrium was moderately dilated. - Systemic veins: IVC not visualized.  Impressions:  - Normal LV size with mild LV hypertrophy. EF 60-65%. Moderate diastolic dysfunction. There was a bioprosthetic aortic valve with possible moderate perivalvular aortic insufficiency. There was a moderately elevated gradient across the bioprosthetic valve though the valve appeared to open well. This may be due to a high flow across the valve with AI and a relatively small valve. Moderate biatrial enlargement. Normal RV size and systolic function.   Outpatient Encounter Prescriptions as of 12/10/2013  Medication Sig  . allopurinol (ZYLOPRIM) 100 MG tablet Take 1 tablet (100 mg total) by mouth daily.  [name] Kitchen amiodarone (PACERONE) 200 MG tablet Take 1 tablet (200 mg total) by mouth daily.  [name] Kitchen amLODipine (NORVASC) 5 MG tablet Take 1 tablet (5 mg total) by mouth daily.  [name] Kitchen aspirin 325 MG tablet Take 325 mg by mouth daily.    . Cholecalciferol (VITAMIN D) 1000 UNITS capsule Take 1 capsule (1,000 Units total) by mouth daily.  . colchicine 0.6 MG tablet Take 0.6 mg by mouth daily as needed (gout flares). Take one tablet by mouth daily during an acute episode of gout.  . fish oil-omega-3 fatty acids 1000 MG capsule Take 1 g by mouth daily.    . Flaxseed, Linseed, (FLAX SEED OIL) 1000 MG CAPS Take 1 capsule by mouth daily.   . furosemide (LASIX) 80 MG tablet Take 1 tablet (80 mg total) by mouth 2 (two) times daily.  [name] Kitchen levothyroxine (LEVOTHROID) 25 MCG tablet Take 1 tablet (25 mcg total) by mouth daily.  . Multiple Vitamin (MULTIVITAMIN) tablet Take 1 tablet by mouth daily.    [name] Kitchen omeprazole (PRILOSEC) 20 MG capsule Take 1 capsule (20 mg total) by mouth daily.  [name] Kitchen  potassium chloride (K-DUR) 10 MEQ tablet Take 1 tablet (10 mEq total) by mouth daily.  . propranolol (INDERAL) 10 MG tablet  Take 1 tablet (10 mg total) by mouth 2 (two) times daily.  . sertraline (ZOLOFT) 50 MG tablet 1/2 daily (Patient taking differently: Take 50 mg by mouth daily. Patient is now taking one 50mg  tab daily.)  . vitamin C (ASCORBIC ACID) 500 MG tablet Take 1 tablet (500 mg total) by mouth daily.    Allergies  Allergen Reactions  . Penicillins Other (See Comments)    Does not remember reaction (~year 1950)  . Neomycin-Bacitracin Zn-Polymyx     ? Reaction (thinks he remembers redness)    Past Medical History  Diagnosis Date  . Stroke   . Atrial fibrillation     a. amiodarone therapy; not felt to be a candidate for anticoagulation - bruises easily.  . Bradycardia     a. only tolerates metoprolol 12.5mg  daily (bradycardic w/ bid dosing).  . Aortic stenosis     a. Previously severe -> s/p minimally invasive tissue AVR with Dr. Evelina Dun at [facility] 01/2011 (pre-AVR cath with no obs CAD);  b. 12/2012 TEE: EF 60-65%, no veg, mild AS, triv AI.  [name] Kitchen Hypertension   . Anemia   . Action tremor   . Thrombocytopenia     Dr. Benay Spice  . Colon polyp   . Anxiety   . Splenomegaly   . Gout   . GERD (gastroesophageal reflux disease)   . Hypothyroidism   . Leukopenia     Chronic pancytopenia  . Degenerative disc disease   . Joint effusion, knee     left knee  . Synovial cyst of popliteal space   . Cellulitis of left leg 10/11-16/2012  . CKD (chronic kidney disease), stage III   . Chronic diastolic CHF (congestive heart failure)     a. 12/15/2012 TEE: EF 60-65%, no veg.  . Bacteremia     a. 12/2012 - S bovis;  b. 12/2012 TEE w/o veg;  c. Seeing ID->Rocephin therapy extended to 01/25/2013 via PICC for possible endocarditis (No veg on TEE).  . High cholesterol   . Heart murmur   . Diabetes mellitus     "borderline" (01/05/2013)  . History of blood transfusion 01/2011; 11/2012    "related to heart valve replaced; low HgB count" (01/05/2013)  . H/O hiatal hernia   . Throat cancer     s/p lasered  .  Diverticulosis     family history includes Cancer in his other; Colon cancer in an other family member; Stroke in his other and another family member.   ROS: Negative except as per HPI  BP 138/54 mmHg  Pulse 48  Ht 5\' 10"  (1.778 m)  Wt 195 lb (88.451 kg)  BMI 27.98 kg/m2  PHYSICAL EXAM: Pt is alert and oriented, elderly male in NAD HEENT: normal Neck: JVP - normal, carotids 2+= without bruits Lungs: CTA bilaterally CV: RRR with grade 2/6 systolic murmur and 2/6 diastolic decrescendo murmur at the LLSB Abd: soft, NT, Positive BS, no hepatomegaly Ext: 1+ pretibial edema bilaterally, distal pulses intact and equal Skin: warm/dry no rash   ASSESSMENT AND PLAN: 1. Paroxysmal atrial fibrillation: Maintaining sinus rhythm on amiodarone. No anticoagulation in setting chronic pancytopenia with plt count less than 50,000. Will continue to follow amiodarone surveillance labs periodically.  2. Aortic valve disorder s/p bioprosthetic AVR: Moderate perivalvular AI noted on last echo. Plan repeat echo in about 3 months prior to his return  office visit.   3. Chronic diastolic CHF: furosemide increased at time of last office visit. Seems to be stable so will continue same Rx.   4. CKD< Stage 4: avoid nephrotoxins. Overall stable.   5. Essential HTN: BP controlled on current Rx.   [name], MD 12/10/2013 9:45 AM

## 2013-12-10 ENCOUNTER — Ambulatory Visit (INDEPENDENT_AMBULATORY_CARE_PROVIDER_SITE_OTHER): Payer: Medicare Other | Admitting: Cardiovascular Disease

## 2013-12-10 ENCOUNTER — Encounter: Payer: Self-pay | Admitting: Cardiovascular Disease

## 2013-12-10 VITALS — BP 138/54 | HR 48 | Ht 70.0 in | Wt 195.0 lb

## 2013-12-10 DIAGNOSIS — I350 Nonrheumatic aortic (valve) stenosis: Secondary | ICD-10-CM

## 2013-12-10 DIAGNOSIS — I480 Paroxysmal atrial fibrillation: Secondary | ICD-10-CM

## 2013-12-10 DIAGNOSIS — I2510 Atherosclerotic heart disease of native coronary artery without angina pectoris: Secondary | ICD-10-CM

## 2013-12-10 NOTE — Patient Instructions (Signed)
Your physician has requested that you have an echocardiogram in MARCH 2016. Echocardiography is a painless test that uses sound waves to create images of your heart. It provides your doctor with information about the size and shape of your heart and how well your heart's chambers and valves are working. This procedure takes approximately one hour. There are no restrictions for this procedure.  Your physician recommends that you return for lab work in: MARCH 2016 (LIVER, TSH, Free T4 and BMP)  Your physician wants you to follow-up in: MARCH 2016 with Dr Burt Knack.  You will receive a reminder letter in the mail two months in advance. If you don't receive a letter, please call our office to schedule the follow-up appointment.  Your physician recommends that you continue on your current medications as directed. Please refer to the Current Medication list given to you today.

## 2013-12-28 ENCOUNTER — Other Ambulatory Visit: Payer: Self-pay

## 2013-12-28 MED ORDER — LEVOTHYROXINE SODIUM 25 MCG PO TABS: 25.0000 ug | ORAL_TABLET | Freq: Every day | ORAL | Status: DC

## 2014-02-04 ENCOUNTER — Other Ambulatory Visit: Payer: Self-pay | Admitting: *Deleted

## 2014-02-04 MED ORDER — POTASSIUM CHLORIDE ER 10 MEQ PO TBCR: 10.0000 meq | EXTENDED_RELEASE_TABLET | Freq: Every day | ORAL | Status: DC

## 2014-02-04 MED ORDER — OMEPRAZOLE 20 MG PO CPDR: 20.0000 mg | DELAYED_RELEASE_CAPSULE | Freq: Every day | ORAL | Status: DC

## 2014-02-04 MED ORDER — AMLODIPINE BESYLATE 5 MG PO TABS: 5.0000 mg | ORAL_TABLET | Freq: Every day | ORAL | Status: DC

## 2014-02-04 MED ORDER — LEVOTHYROXINE SODIUM 25 MCG PO TABS: 25.0000 ug | ORAL_TABLET | Freq: Every day | ORAL | Status: DC

## 2014-02-04 MED ORDER — FUROSEMIDE 80 MG PO TABS: 80.0000 mg | ORAL_TABLET | Freq: Two times a day (BID) | ORAL | Status: DC

## 2014-02-04 MED ORDER — AMIODARONE HCL 200 MG PO TABS: 200.0000 mg | ORAL_TABLET | Freq: Every day | ORAL | Status: DC

## 2014-02-13 ENCOUNTER — Ambulatory Visit (INDEPENDENT_AMBULATORY_CARE_PROVIDER_SITE_OTHER): Payer: Commercial Managed Care - HMO | Admitting: Family Medicine

## 2014-02-13 ENCOUNTER — Encounter: Payer: Self-pay | Admitting: Family Medicine

## 2014-02-13 VITALS — BP 140/70 | Temp 98.9°F | Wt 194.0 lb

## 2014-02-13 DIAGNOSIS — K610 Anal abscess: Secondary | ICD-10-CM

## 2014-02-13 NOTE — Progress Notes (Signed)
Subjective:    Patient ID: [name], male    DOB: 25-Apr-1922, 81 y.o.   MRN: [id number]  HPI Patient seen Saturday clinic with one-week history of progressive perianal pain. He has not had any fevers or chills. They noticed some mild redness and swelling-but mostly pain. He's tried warm compresses and also warm tub bath without much improvement. He's not had any history of recent abscess. Otherwise doing well.  Past Medical History  Diagnosis Date  . Stroke   . Atrial fibrillation     a. amiodarone therapy; not felt to be a candidate for anticoagulation - bruises easily.  . Bradycardia     a. only tolerates metoprolol 12.5mg  daily (bradycardic w/ bid dosing).  . Aortic stenosis     a. Previously severe -> s/p minimally invasive tissue AVR with Dr. Evelina Dun at [facility] 01/2011 (pre-AVR cath with no obs CAD);  b. 12/2012 TEE: EF 60-65%, no veg, mild AS, triv AI.  [name] Kitchen Hypertension   . Anemia   . Action tremor   . Thrombocytopenia     Dr. Benay Spice  . Colon polyp   . Anxiety   . Splenomegaly   . Gout   . GERD (gastroesophageal reflux disease)   . Hypothyroidism   . Leukopenia     Chronic pancytopenia  . Degenerative disc disease   . Joint effusion, knee     left knee  . Synovial cyst of popliteal space   . Cellulitis of left leg 10/11-16/2012  . CKD (chronic kidney disease), stage III   . Chronic diastolic CHF (congestive heart failure)     a. 12/15/2012 TEE: EF 60-65%, no veg.  . Bacteremia     a. 12/2012 - S bovis;  b. 12/2012 TEE w/o veg;  c. Seeing ID->Rocephin therapy extended to 01/25/2013 via PICC for possible endocarditis (No veg on TEE).  . High cholesterol   . Heart murmur   . Diabetes mellitus     "borderline" (01/05/2013)  . History of blood transfusion 01/2011; 11/2012    "related to heart valve replaced; low HgB count" (01/05/2013)  . H/O hiatal hernia   . Throat cancer     s/p lasered  . Diverticulosis    Past Surgical History  Procedure Laterality Date  .  Cholecystectomy    . Joint effusion      left knee  . Aortic valve replacement  01/23/2011    via minimally invasive approach per Dr Evelina Dun, [facility]  . Tee without cardioversion N/A 12/15/2012    Procedure: TRANSESOPHAGEAL ECHOCARDIOGRAM (TEE);  Surgeon: [name], MD;  Location: [facility] ENDOSCOPY;  Service: Cardiovascular;  Laterality: N/A;  . Cardiac valve replacement    . Inguinal hernia repair Right   . Excisional hemorrhoidectomy    . Cardiac catheterization    . Surgery scrotal / testicular      "removed one" (01/05/2013)  . 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 His smoking use included Cigarettes. He has a 18 pack-year smoking history. He has never used smokeless tobacco. He reports that he does not drink alcohol or use illicit drugs. family history includes Cancer in his other; Colon cancer in an other family member; Stroke in his other and another family member. Allergies  Allergen Reactions  .  Penicillins Other (See Comments)    Does not remember reaction (~year 1950)  . Neomycin-Bacitracin Zn-Polymyx     ? Reaction (thinks he remembers redness)      Review of Systems  Constitutional: Negative for fever and chills.  Gastrointestinal: Negative for nausea, vomiting, abdominal pain, diarrhea and constipation.       Objective:   Physical Exam  Constitutional: He appears well-developed and well-nourished.  Cardiovascular: Normal rate.   Pulmonary/Chest: Effort normal and breath sounds normal. No respiratory distress.  Neurological: He is alert.  Skin:  Patient has redness which is very minimal but mostly swelling and tender/ fluctuance around 7:00 position just outside the anus. Very tender to palpation.  Has some no-ninflammed skin tags but no thrombosed hemorrhoids. No  surrounding cellulitis changes.          Assessment & Plan:  Perianal abscess. We recommended incision and drainage and patient consented. He does take regular aspirin and history of low platelets with counts around 40,000. No recent active bleeding problems. Prepped skin with Betadine. Anesthesia 1% plain Xylocaine. Using #11 blade made a linear incision approximately one and 1/2 cm. Expressed copious pus.. We explored with curved hemostats and freed up some deeper pus pockets. Wound cavity packed with iodoform gauze we recommended keeping this area dry as possible. Follow-up with primary in 2 days to reassess.

## 2014-02-13 NOTE — Patient Instructions (Signed)
Abscess An abscess is an infected area that contains a collection of pus and debris.It can occur in almost any part of the body. An abscess is also known as a furuncle or boil. CAUSES  An abscess occurs when tissue gets infected. This can occur from blockage of oil or sweat glands, infection of hair follicles, or a minor injury to the skin. As the body tries to fight the infection, pus collects in the area and creates pressure under the skin. This pressure causes pain. People with weakened immune systems have difficulty fighting infections and get certain abscesses more often.  SYMPTOMS Usually an abscess develops on the skin and becomes a painful mass that is red, warm, and tender. If the abscess forms under the skin, you may feel a moveable soft area under the skin. Some abscesses break open (rupture) on their own, but most will continue to get worse without care. The infection can spread deeper into the body and eventually into the bloodstream, causing you to feel ill.  DIAGNOSIS  Your caregiver will take your medical history and perform a physical exam. A sample of fluid may also be taken from the abscess to determine what is causing your infection. TREATMENT  Your caregiver may prescribe antibiotic medicines to fight the infection. However, taking antibiotics alone usually does not cure an abscess. Your caregiver may need to make a small cut (incision) in the abscess to drain the pus. In some cases, gauze is packed into the abscess to reduce pain and to continue draining the area. HOME CARE INSTRUCTIONS   Only take over-the-counter or prescription medicines for pain, discomfort, or fever as directed by your caregiver.  If you were prescribed antibiotics, take them as directed. Finish them even if you start to feel better.  If gauze is used, follow your caregiver's directions for changing the gauze.  To avoid spreading the infection:  Keep your draining abscess covered with a  bandage.  Wash your hands well.  Do not share personal care items, towels, or whirlpools with others.  Avoid skin contact with others.  Keep your skin and clothes clean around the abscess.  Keep all follow-up appointments as directed by your caregiver. SEEK MEDICAL CARE IF:   You have increased pain, swelling, redness, fluid drainage, or bleeding.  You have muscle aches, chills, or a general ill feeling.  You have a fever. MAKE SURE YOU:   Understand these instructions.  Will watch your condition.  Will get help right away if you are not doing well or get worse. Document Released: 10/04/2004 Document Revised: 06/26/2011 Document Reviewed: 03/09/2011 [facility] Patient Information 2015 [place], [place]. This information is not intended to replace advice given to you by your health care provider. Make sure you discuss any questions you have with your health care provider.  Keep wound dry. May change outer dressing if needed (if drainage soaking through) Follow up with Dr Linna Darner in 2-3 days Touch base with me sooner for any fever or other concerns.

## 2014-02-13 NOTE — Progress Notes (Signed)
Pre visit review using our clinic review tool, if applicable. No additional management support is needed unless otherwise documented below in the visit note. 

## 2014-02-15 ENCOUNTER — Encounter: Payer: Self-pay | Admitting: Internal Medicine

## 2014-02-15 ENCOUNTER — Ambulatory Visit (INDEPENDENT_AMBULATORY_CARE_PROVIDER_SITE_OTHER): Payer: Commercial Managed Care - HMO | Admitting: Internal Medicine

## 2014-02-15 ENCOUNTER — Telehealth: Payer: Self-pay | Admitting: *Deleted

## 2014-02-15 VITALS — BP 110/50 | HR 53 | Temp 98.0°F

## 2014-02-15 DIAGNOSIS — K611 Rectal abscess: Secondary | ICD-10-CM

## 2014-02-15 MED ORDER — MUPIROCIN 2 % EX OINT: TOPICAL_OINTMENT | CUTANEOUS | Status: DC

## 2014-02-15 NOTE — Progress Notes (Signed)
   Subjective:    Patient ID: [name], male    DOB: 24-Dec-1922, 81 y.o.   MRN: [id number]  HPI  He was seen in the Saturday clinic 02/13/14 for incision and drainage of a peri anal abscess. This was packed; he was requested to come back for follow-up.  The pain had been up to level X; now it maxes out at 5. He has only taken Tylenol for the pain.  He denies any fever, chills, or sweats. No purulence from the wound.  He does have thrombocytopenia but denies any bleeding dyscrasias.  Review of Systems Epistaxis, hemoptysis, hematuria, melena, or rectal bleeding denied. No unexplained weight loss, significant dyspepsia,dysphagia, or abdominal pain.  There is no abnormal bruising , bleeding, or difficulty stopping bleeding with injury.    Objective:   Physical Exam  Pertinent or positive findings include: He has a grade 2. 5-3 systolic murmur at the base. Abdomen is protuberant without organomegaly or masses. He has 1+ pitting edema of the lower extremities below the knees. Despite the edema he has strong pedal pulses.  The incision measures 9 x 2 mm; the packing was removed. No purulence was noted; some serosanguinous drainage on the packing. There is some induration around the laceration which is tender to palpation. Clinically there was no evidence of cellulitis.  General appearance :adequately nourished; in no distress. Eyes: No conjunctival inflammation or scleral icterus is present. Heart:  Slowl rate and regular rhythm. S1 and S2 normal without gallop, click, rub or other extra sounds  Lungs:Chest clear to auscultation; no wheezes, rhonchi,rales ,or rubs present.No increased work of breathing.  Abdomen: bowel sounds normal, soft and non-tender without masses, organomegaly or hernias noted.  No guarding or rebound Vascular : all pulses equal ; no bruits present. Skin:Warm & dry.  Intact without suspicious lesions or rashes ; no jaundice or tenting Lymphatic: No  lymphadenopathy is noted about the head, neck, axilla Neuro: Strength, tone normal.      Assessment & Plan:  #1 perianal abscess, status post incision and drainage. Clinically no active cellulitis #2 prosthetic heart valve #3 thrombocytopenia  Plan: See orders and after visit summary.

## 2014-02-15 NOTE — Telephone Encounter (Signed)
[facility] - [name] Gender: Male DOB: 03/01/1944 Age: 81 Y 11 M 15 D Return Phone Number: [phone] (Primary), [phone] (Secondary) Address: [address] City/State/Zip: [place] [address] Client [facility] - Client Client Site [place] - Night Physician Plotnikov, Alex Contact Type Call Call Type Triage / Clinical Relationship To Patient Self Return Phone Number [phone] (Primary) Chief Complaint Cold Symptom Initial Comment Caller states she has the chills, cannot get warm, urinating a lot. PreDisposition Did not know what to do Nurse Assessment Nurse: Luther Parody, RN, Malachy Mood Date/Time (Eastern Time): 02/13/2014 10:28:02 AM Confirm and document reason for call. If symptomatic, describe symptoms. ---Caller states that she had severe chills intermittently during the night along with frequent urination. Denies fever or pain. Has the patient traveled out of the country within the last 30 days? ---Not Applicable Does the patient require triage? ---Yes Related visit to physician within the last 2 weeks? ---No Does the PT have any chronic conditions? (i.e. diabetes, asthma, etc.) ---Yes List chronic conditions. ---htn, depression Guidelines Guideline Title Affirmed Question Affirmed Notes Nurse Date/Time (Eastern Time) Urinary Symptoms Side (flank) or lower back pain present Luther Parody, RN, Malachy Mood 02/13/2014 10:29:37 AM Disp. Time Eilene Ghazi Time) Disposition Final User 02/13/2014 10:33:48 AM See Physician within 24 Hours Yes Luther Parody, RN, Erskine Speed Understands: Yes Disagree/Comply: Comply Care Advice Given Per Guideline SEE PHYSICIAN WITHIN 24 HOURS: CARE ADVICE given per Urinary Symptoms (Adult) guideline. * You become worse. * Fever occurs CALL BACK IF: PLEASE NOTE: All timestamps contained within this report are  represented as [place] Standard Time. CONFIDENTIALTY NOTICE: This fax transmission is intended only for the addressee. It contains information that is legally privileged, confidential or otherwise protected from use or disclosure. If you are not the intended recipient, you are strictly prohibited from reviewing, disclosing, copying using or disseminating any of this information or taking any action in reliance on or regarding this information. If you have received this fax in error, please notify us immediately by telephone so that we can arrange for its return to [place]. Phone: [phone], Toll-Free: [phone], Fax: [phone] Page: 2 of 2 Call Id: [id number] After Care Instructions Given Call Event Type User Date / Time Description Referrals [facility]

## 2014-02-15 NOTE — Progress Notes (Signed)
Pre visit review using our clinic review tool, if applicable. No additional management support is needed unless otherwise documented below in the visit note. 

## 2014-02-15 NOTE — Patient Instructions (Addendum)
Apply the generic Bactroban 2   times a day aftr cleansing. Please report warning signs as we discussed. Worrisome would be red streaks up the buttocks, increased pain, fever, or pus production.  Use soft tissue to remove the stool  and then cleanse the rectal area with a Tucks or Baby Wipe. You can use a rubber donut when sitting to keep pressure off the abscess.

## 2014-02-22 ENCOUNTER — Other Ambulatory Visit (HOSPITAL_BASED_OUTPATIENT_CLINIC_OR_DEPARTMENT_OTHER): Payer: Commercial Managed Care - HMO

## 2014-02-22 ENCOUNTER — Ambulatory Visit (HOSPITAL_BASED_OUTPATIENT_CLINIC_OR_DEPARTMENT_OTHER): Payer: Commercial Managed Care - HMO | Admitting: Oncology

## 2014-02-22 ENCOUNTER — Telehealth: Payer: Self-pay | Admitting: Oncology

## 2014-02-22 VITALS — BP 125/40 | HR 50 | Temp 97.8°F | Resp 18 | Ht 70.0 in | Wt 194.8 lb

## 2014-02-22 DIAGNOSIS — I10 Essential (primary) hypertension: Secondary | ICD-10-CM

## 2014-02-22 DIAGNOSIS — R161 Splenomegaly, not elsewhere classified: Secondary | ICD-10-CM

## 2014-02-22 DIAGNOSIS — D631 Anemia in chronic kidney disease: Secondary | ICD-10-CM

## 2014-02-22 DIAGNOSIS — N189 Chronic kidney disease, unspecified: Secondary | ICD-10-CM

## 2014-02-22 DIAGNOSIS — I480 Paroxysmal atrial fibrillation: Secondary | ICD-10-CM

## 2014-02-22 DIAGNOSIS — R5381 Other malaise: Secondary | ICD-10-CM

## 2014-02-22 DIAGNOSIS — D61818 Other pancytopenia: Secondary | ICD-10-CM

## 2014-02-22 LAB — CBC WITH DIFFERENTIAL/PLATELET
BASO%: 0 % (ref 0.0–2.0)
BASOS ABS: 0 10*3/uL (ref 0.0–0.1)
EOS%: 2.5 % (ref 0.0–7.0)
Eosinophils Absolute: 0.1 10*3/uL (ref 0.0–0.5)
HCT: 26.2 % — ABNORMAL LOW (ref 38.4–49.9)
HGB: 8.1 g/dL — ABNORMAL LOW (ref 13.0–17.1)
LYMPH%: 13.8 % — ABNORMAL LOW (ref 14.0–49.0)
MCH: 26.2 pg — ABNORMAL LOW (ref 27.2–33.4)
MCHC: 30.9 g/dL — ABNORMAL LOW (ref 32.0–36.0)
MCV: 84.8 fL (ref 79.3–98.0)
MONO#: 0.1 10*3/uL (ref 0.1–0.9)
MONO%: 4.6 % (ref 0.0–14.0)
NEUT%: 79.1 % — AB (ref 39.0–75.0)
NEUTROS ABS: 1.9 10*3/uL (ref 1.5–6.5)
NRBC: 0 % (ref 0–0)
PLATELETS: 39 10*3/uL — AB (ref 140–400)
RBC: 3.09 10*6/uL — ABNORMAL LOW (ref 4.20–5.82)
RDW: 18.8 % — AB (ref 11.0–14.6)
WBC: 2.4 10*3/uL — ABNORMAL LOW (ref 4.0–10.3)
lymph#: 0.3 10*3/uL — ABNORMAL LOW (ref 0.9–3.3)

## 2014-02-22 LAB — TECHNOLOGIST REVIEW

## 2014-02-22 NOTE — Progress Notes (Signed)
  [facility] OFFICE PROGRESS NOTE   Diagnosis: Pancytopenia  INTERVAL HISTORY:   [name] returns as scheduled. He reports undergoing an incision and drainage procedure of a perianal lesion on 02/13/2014. The lesion has improved. No fever or night sweats. No other recent infection. He reports mild exertional dyspnea. He is able to go about his usual activities without difficulty.  Objective:  Vital signs in last 24 hours:  Blood pressure 125/40, pulse 50, temperature 97.8 F (36.6 C), temperature source Oral, resp. rate 18, height 5\' 10"  (1.778 m), weight 194 lb 12.8 oz (88.361 kg).    HEENT: Slight enlargement of the right compared to the left tonsil, neck without mass Lymphatics: No cervical, supraclavicular, or axillar nodes Resp: Lungs clear bilaterally Cardio: Regular rate and rhythm GI: No hepatomegaly, fullness in the left mid to upper abdomen with a palpable spleen tip, raised nodular 1 cm lesion with a central clearing at the left anal margin Vascular: Pitting edema at the lower legs bilaterally   Lab Results:  Lab Results  Component Value Date   WBC 2.4* 02/22/2014   HGB 8.1* 02/22/2014   HCT 26.2* 02/22/2014   MCV 84.8 02/22/2014   PLT 39* 02/22/2014   NEUTROABS 1.9 02/22/2014    Lab Results  Component Value Date   NA 141 09/21/2013    No results found for: CEA  Imaging:  No results found.  Medications: I have reviewed the patient's current medications.  Assessment/Plan: 1. Chronic pancytopenia/splenomegaly-likely related to a chronic lymphoproliferative disorder,? Splenic lymphoma  2. History of severe aortic stenosis, status post aortic valve replacement surgery at [facility] on 01/23/2011  3. History of gout  4. Diabetes  5. Streptococcus bacteremia-TEE negative for endocarditis  6. Malaise-likely multifactorial 7. Renal insufficiency  8. Paroxysmal atrial fibrillation  9. Colon polyps noted on a virtual colonoscopy  02/10/2013  10. Anemia secondary to renal insufficiency and the chronic lymphoproliferative disorder   Disposition:  [name] appears unchanged. He will seek medical attention if the perianal cystic lesion/abscess returns. He is more anemic today. He does not appear to have significant symptoms related to anemia at present. I suspect the anemia is related to the chronic lymphoproliferative disorder and renal insufficiency. We will begin a trial of erythropoietin if he has progressive or symptomatic anemia. [name] will return for an office visit and CBC in one month. He knows to contact us in the interim for increased dyspnea.  Betsy Coder, MD  02/22/2014  9:30 AM

## 2014-02-22 NOTE — Telephone Encounter (Signed)
Pt confirmed labs/ov per 02/15 POF, gave pt AVS.... KJ, called pt to confirm D/T per MD to 03/14 labs/ov.[name] KitchenMarland Kitchen

## 2014-03-01 ENCOUNTER — Telehealth: Payer: Self-pay | Admitting: Oncology

## 2014-03-01 NOTE — Telephone Encounter (Signed)
Pt's wife called to r/s due to they are going out of town .... Cherylann Banas

## 2014-03-08 ENCOUNTER — Ambulatory Visit (INDEPENDENT_AMBULATORY_CARE_PROVIDER_SITE_OTHER): Payer: Commercial Managed Care - HMO | Admitting: Internal Medicine

## 2014-03-08 ENCOUNTER — Encounter: Payer: Self-pay | Admitting: Internal Medicine

## 2014-03-08 VITALS — BP 164/55 | HR 52 | Temp 98.4°F | Ht 70.0 in | Wt 193.0 lb

## 2014-03-08 DIAGNOSIS — G479 Sleep disorder, unspecified: Secondary | ICD-10-CM

## 2014-03-08 MED ORDER — CLONAZEPAM 0.5 MG PO TABS: ORAL_TABLET | ORAL | Status: DC

## 2014-03-08 NOTE — Patient Instructions (Signed)
To prevent sleep dysfunction follow these instructions for sleep hygiene. Do not read, watch TV, or eat in bed. Do not get into bed until you are ready to turn off the light &  to go to sleep. Do not ingest stimulants ( decongestants, diet pills, nicotine, caffeine) after the evening meal.Do not take daytime naps.Cardiovascular exercise, this can be as simple a program as walking, is recommended 30-45 minutes 3-4 times per week. If you're not exercising you should take 6-8 weeks to build up to this level. 

## 2014-03-08 NOTE — Progress Notes (Signed)
Pre visit review using our clinic review tool, if applicable. No additional management support is needed unless otherwise documented below in the visit note. 

## 2014-03-08 NOTE — Progress Notes (Signed)
   Subjective:    Patient ID: [name], male    DOB: 07/14/1922, 81 y.o.   MRN: [id number]  HPI For at least 6 weeks he has had sleep disruption manifested as early arousal from sleep. He will typically go to bed at 10:30 PM but awakens an hour later. He has difficulty going back to sleep. He & wife will get up & watch TV;sometimes they will drink hot chocolate after getting up. This morning he went back to sleep at 4 AM and slept until 8 AM.  Review of Systems  He denies daytime naps or ingestion of stimulants except the hot chocolate.  His wife has not observed restless leg symptoms,excess snoring, or apnea.  This is less of a problem in the Summer when he can be physically active.  With the symptoms he has had decreased energy. He did have a self-limited bleeding in  the right eye but no other bleeding dyscrasias despite ASA 325 mg qd  & platelet # of 39,000. His pancytopenia is monitored by Dr Benay Spice. Heme/Onc.  He denies fever, chills, sweats, weight loss, melena,or  rectal bleeding.    Objective:   Physical Exam  Positive findings: Pattern alopecia is present; he has a goatee.  There is a grade 2 systolic murmur at the base.  Abdomen protuberant; a ventral hernia is present.  He has tense edema below the knees   Pedal pulses are decreased Hyperpigmentation over forearms from bruising.   General appearance :adequately nourished; in no distress. Eyes: No conjunctival inflammation or scleral icterus is present. Heart:  Normal rate and regular rhythm. S1 and S2 normal without gallop, click, rub or other extra sounds   Lungs:Chest clear to auscultation; no wheezes, rhonchi,rales ,or rubs present.No increased work of breathing.  Abdomen: bowel sounds normal, soft and non-tender without masses, organomegaly or hernias noted.  No guarding or rebound.  Skin:Warm & dry.  Intact without suspicious lesions or rashes ; no tenting or jaundice  Lymphatic: No lymphadenopathy is noted  about the head, neck, axilla Neuro: Strength, tone  normal.       Assessment & Plan:  #1 sleep disorder, multifactorial  #2 malaise secondary to #1  Plan: See orders and after visit summary

## 2014-03-11 ENCOUNTER — Other Ambulatory Visit (INDEPENDENT_AMBULATORY_CARE_PROVIDER_SITE_OTHER): Payer: Commercial Managed Care - HMO | Admitting: *Deleted

## 2014-03-11 ENCOUNTER — Telehealth: Payer: Self-pay | Admitting: Cardiovascular Disease

## 2014-03-11 ENCOUNTER — Ambulatory Visit (HOSPITAL_COMMUNITY): Payer: Commercial Managed Care - HMO | Attending: Cardiovascular Disease | Admitting: Radiology

## 2014-03-11 DIAGNOSIS — I359 Nonrheumatic aortic valve disorder, unspecified: Secondary | ICD-10-CM | POA: Diagnosis not present

## 2014-03-11 DIAGNOSIS — I480 Paroxysmal atrial fibrillation: Secondary | ICD-10-CM

## 2014-03-11 DIAGNOSIS — I350 Nonrheumatic aortic (valve) stenosis: Secondary | ICD-10-CM

## 2014-03-11 DIAGNOSIS — I2510 Atherosclerotic heart disease of native coronary artery without angina pectoris: Secondary | ICD-10-CM

## 2014-03-11 LAB — BASIC METABOLIC PANEL
BUN: 59 mg/dL — ABNORMAL HIGH (ref 6–23)
CO2: 26 mEq/L (ref 19–32)
CREATININE: 2.43 mg/dL — AB (ref 0.40–1.50)
Calcium: 8.8 mg/dL (ref 8.4–10.5)
Chloride: 108 mEq/L (ref 96–112)
GFR: 26.78 mL/min — ABNORMAL LOW (ref >60.00)
Glucose, Bld: 184 mg/dL — ABNORMAL HIGH (ref 70–99)
Potassium: 4.3 mEq/L (ref 3.5–5.1)
Sodium: 138 mEq/L (ref 135–145)

## 2014-03-11 LAB — HEPATIC FUNCTION PANEL
ALT: 17 U/L (ref 0–53)
AST: 23 U/L (ref 0–37)
Albumin: 3.8 g/dL (ref 3.5–5.2)
Alkaline Phosphatase: 74 U/L (ref 39–117)
BILIRUBIN TOTAL: 0.6 mg/dL (ref 0.2–1.2)
Bilirubin, Direct: 0.2 mg/dL (ref 0.0–0.3)
Total Protein: 6.3 g/dL (ref 6.0–8.3)

## 2014-03-11 LAB — T4, FREE: Free T4: 1.08 ng/dL (ref 0.60–1.60)

## 2014-03-11 LAB — TSH: TSH: 5.15 u[IU]/mL — AB (ref 0.35–4.50)

## 2014-03-11 NOTE — Telephone Encounter (Signed)
Walk in pt form " Sealed envelope" for Dr.Cooper gave to [facility]

## 2014-03-11 NOTE — Progress Notes (Signed)
Echocardiogram performed.  

## 2014-03-15 ENCOUNTER — Telehealth: Payer: Self-pay | Admitting: Oncology

## 2014-03-15 NOTE — Telephone Encounter (Signed)
Pt called and confirmed r/s labs/ov from 04/01 to 04/05 pt will be out of town.... KJ

## 2014-03-16 ENCOUNTER — Encounter: Payer: Self-pay | Admitting: Cardiovascular Disease

## 2014-03-16 ENCOUNTER — Ambulatory Visit (INDEPENDENT_AMBULATORY_CARE_PROVIDER_SITE_OTHER): Payer: Commercial Managed Care - HMO | Admitting: Cardiovascular Disease

## 2014-03-16 ENCOUNTER — Telehealth: Payer: Self-pay | Admitting: Internal Medicine

## 2014-03-16 VITALS — BP 116/42 | HR 49 | Ht 70.0 in | Wt 191.8 lb

## 2014-03-16 DIAGNOSIS — I350 Nonrheumatic aortic (valve) stenosis: Secondary | ICD-10-CM

## 2014-03-16 DIAGNOSIS — I5033 Acute on chronic diastolic (congestive) heart failure: Secondary | ICD-10-CM

## 2014-03-16 MED ORDER — ALLOPURINOL 100 MG PO TABS: 100.0000 mg | ORAL_TABLET | Freq: Every day | ORAL | Status: DC

## 2014-03-16 MED ORDER — FUROSEMIDE 80 MG PO TABS: 120.0000 mg | ORAL_TABLET | Freq: Two times a day (BID) | ORAL | Status: DC

## 2014-03-16 NOTE — Patient Instructions (Addendum)
You have been referred to [name] at [facility]ts will contact you with an appointment).  Your physician has recommended you make the following change in your medication:  1. INCREASE Furosemide to 80mg  take one and one-half tablet by mouth twice a day  Your physician recommends that you return for lab work in: [place] (BMP)  Your physician recommends that you schedule a follow-up appointment in: [place] with Dr Burt Knack

## 2014-03-16 NOTE — Telephone Encounter (Signed)
Patient is requesting allopurinol 100mg  and zyloprim 100mg  to be sent to [facility] for a 90 day supply.

## 2014-03-16 NOTE — Progress Notes (Signed)
Cardiology Office Note   Date:  03/16/2014   ID:  [name], DOB May 02, 1922, MRN [id number]  PCP:  [name], MD  Cardiologist:  [name], MD    Chief Complaint  Patient presents with  . Shortness of Breath    History of Present Illness: [name] is a 81 y.o. male who presents for follow-up evaluation.   The patient is followed for atrial fibrillation, aortic stenosis status post bioprosthetic aortic valve replacement, bradycardia, diastolic heart failure, and coronary artery disease. The patient has not been anticoagulated because of pancytopenia with platelets less than 50,000.  He underwent bioprosthetic aortic valve replacement at [facility] in 2013. He was initially considered for the CoreValve Trial but was excluded because of platelet count less than 50,000. He ultimately underwent surgical aortic valve replacement via a right mini thoracotomy with a 25 mm Mosaic Ultra porcine prosthesis. I cannot find a record of cardiac catheterization but in review of notes he had no significant coronary artery disease.  The patient has chronic leg edema. This is essentially unchanged. He does note some progression of shortness of breath, especially with walking up gray. He complains of increased fatigue and exercise intolerance. He denies chest pain or pressure. No orthopnea or PND. He denies lightheadedness or syncope. He was recently seen by Dr Benay Spice and is considering initiation of erythropoetin for treatment of chronic anemia.   Past Medical History  Diagnosis Date  . Stroke   . Atrial fibrillation     a. amiodarone therapy; not felt to be a candidate for anticoagulation - bruises easily.  . Bradycardia     a. only tolerates metoprolol 12.5mg  daily (bradycardic w/ bid dosing).  . Aortic stenosis     a. Previously severe -> s/p minimally invasive tissue AVR with Dr. Evelina Dun at [facility] 01/2011 (pre-AVR cath with no obs CAD);  b. 12/2012 TEE: EF  60-65%, no veg, mild AS, triv AI.  [name] Kitchen Hypertension   . Anemia   . Action tremor   . Thrombocytopenia     Dr. Benay Spice  . Colon polyp   . Anxiety   . Splenomegaly   . Gout   . GERD (gastroesophageal reflux disease)   . Hypothyroidism   . Leukopenia     Chronic pancytopenia  . Degenerative disc disease   . Joint effusion, knee     left knee  . Synovial cyst of popliteal space   . Cellulitis of left leg 10/11-16/2012  . CKD (chronic kidney disease), stage III   . Chronic diastolic CHF (congestive heart failure)     a. 12/15/2012 TEE: EF 60-65%, no veg.  . Bacteremia     a. 12/2012 - S bovis;  b. 12/2012 TEE w/o veg;  c. Seeing ID->Rocephin therapy extended to 01/25/2013 via PICC for possible endocarditis (No veg on TEE).  . High cholesterol   . Heart murmur   . Diabetes mellitus     "borderline" (01/05/2013)  . History of blood transfusion 01/2011; 11/2012    "related to heart valve replaced; low HgB count" (01/05/2013)  . H/O hiatal hernia   . Throat cancer     s/p lasered  . Diverticulosis     Past Surgical History  Procedure Laterality Date  . Cholecystectomy    . Joint effusion      left knee  . Aortic valve replacement  01/23/2011    via minimally invasive approach per Dr Evelina Dun, [facility]  . Tee without cardioversion N/A  12/15/2012    Procedure: TRANSESOPHAGEAL ECHOCARDIOGRAM (TEE);  Surgeon: [name], MD;  Location: [facility] ENDOSCOPY;  Service: Cardiovascular;  Laterality: N/A;  . Cardiac valve replacement    . Inguinal hernia repair Right   . Excisional hemorrhoidectomy    . Cardiac catheterization    . Surgery scrotal / testicular      "removed one" (01/05/2013)  . 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 allopurinol (ZYLOPRIM) 100 MG tablet Take 1  tablet (100 mg total) by mouth daily. 30 tablet 0  . amiodarone (PACERONE) 200 MG tablet Take 1 tablet (200 mg total) by mouth daily. 90 tablet 1  . amLODipine (NORVASC) 5 MG tablet Take 1 tablet (5 mg total) by mouth daily. 90 tablet 1  . aspirin 325 MG tablet Take 325 mg by mouth daily.      . Cholecalciferol (VITAMIN D) 1000 UNITS capsule Take 1 capsule (1,000 Units total) by mouth daily. 90 capsule 3  . clonazePAM (KLONOPIN) 0.5 MG tablet 1/2 as needed if you wake up early 30 tablet 1  . colchicine 0.6 MG tablet Take 0.6 mg by mouth daily as needed (gout flares). Take one tablet by mouth daily during an acute episode of gout.    . fish oil-omega-3 fatty acids 1000 MG capsule Take 1 g by mouth daily.      . Flaxseed, Linseed, (FLAX SEED OIL) 1000 MG CAPS Take 1 capsule by mouth daily.     . furosemide (LASIX) 80 MG tablet Take 1.5 tablets (120 mg total) by mouth 2 (two) times daily. 90 tablet 1  . levothyroxine (LEVOTHROID) 25 MCG tablet Take 1 tablet (25 mcg total) by mouth daily. 90 tablet 1  . Multiple Vitamin (MULTIVITAMIN) tablet Take 1 tablet by mouth daily.      . mupirocin ointment (BACTROBAN) 2 % Applied twice a day to the affected area 22 g 0  . omeprazole (PRILOSEC) 20 MG capsule Take 1 capsule (20 mg total) by mouth daily. 90 capsule 1  . potassium chloride (K-DUR) 10 MEQ tablet Take 1 tablet (10 mEq total) by mouth daily. 90 tablet 1  . propranolol (INDERAL) 10 MG tablet Take 1 tablet (10 mg total) by mouth 2 (two) times daily. 180 tablet 1  . sertraline (ZOLOFT) 50 MG tablet Take 50 mg by mouth daily.    . vitamin C (ASCORBIC ACID) 500 MG tablet Take 1 tablet (500 mg total) by mouth daily. 90 tablet 3   No current facility-administered medications for this visit.    Allergies:   Penicillins and Neomycin-bacitracin zn-polymyx   Social History:  The patient  reports that he quit smoking about 52 years ago. His smoking use included Cigarettes. He has a 18 pack-year smoking  history. He has never used smokeless tobacco. He reports that he does not drink alcohol or use illicit drugs.   Family History:  [name]  family history includes Cancer in his other; Colon cancer in an other family member; Stroke in his other and another family member.    ROS:  Please see the history of present illness.  Otherwise, review of systems is positive for leg swelling.  All other systems are reviewed and negative.    PHYSICAL EXAM: VS:  BP 116/42  mmHg  Pulse 49  Ht 5\' 10"  (1.778 m)  Wt 191 lb 12.8 oz (87 kg)  BMI 27.52 kg/m2  SpO2 99% , BMI Body mass index is 27.52 kg/(m^2). GEN: Well nourished, well developed, pleasant elderly male in no acute distress HEENT: normal Neck: no masses, no carotid bruits. There is moderate JVD Cardiac: brady and regular with 3/6 systolic murmur and 2/6 diastolic decrescendo murmur at the RUSB                Respiratory:  clear to auscultation bilaterally, normal work of breathing GI: soft, nontender, nondistended, + BS MS: no deformity or atrophy Ext: 2+ pretibial edema bilaterally Skin: warm and dry, no rash Neuro:  Strength and sensation are intact Psych: euthymic mood, full affect  EKG:  EKG is not ordered today.  Recent Labs: 02/22/2014: Hemoglobin 8.1*; Platelets 39* 03/11/2014: ALT 17; BUN 59*; Creatinine 2.43*; Potassium 4.3; Sodium 138; TSH 5.15*   Lipid Panel     Component Value Date/Time   CHOL 84 09/21/2013 0809   TRIG 94.0 09/21/2013 0809   HDL 25.60* 09/21/2013 0809   CHOLHDL 3 09/21/2013 0809   VLDL 18.8 09/21/2013 0809   LDLCALC 40 09/21/2013 0809      Wt Readings from Last 3 Encounters:  03/16/14 191 lb 12.8 oz (87 kg)  03/08/14 193 lb 0.6 oz (87.562 kg)  02/22/14 194 lb 12.8 oz (88.361 kg)     Cardiac Studies Reviewed: 2D Echo: Study Conclusions  - Left ventricle: The cavity size was normal. There was moderate concentric hypertrophy. Wall motion was normal; there were no regional wall motion  abnormalities. Doppler parameters are consistent with pseduonormal left ventricular relaxation (grade 2 diastolic dysfunction). The E/A ratio is 2.3. The E/e&' ratio is >20, suggesting markedly elevated LV filling pressure. - Aortic valve: Trileaflet bioprosthetic valve with significant degeneration; calcified leaflets with a small amount of echogenic material seen prolapsing into the LVOT with valve closure. Severe aortic stenosis - peak and mean gradients of 61 and 41 mmHg. There is moderate AI. Based on a measured LVOT diameter of 2.0 cm, the indexed AVA is 0.6 cm2/m2. The calculated AVA by continuity is higher, however, this neglects the moderate amount of aortic insufficiency. Valve area (VTI): 1.28 cm^2. Valve area (Vmean): 1.18 cm^2. - Mitral valve: Calcified leaflets with mild restriction. Mild regurgitation. Posterior calcified annulus. - Left atrium: Severely dilated at 66 ml/m2. - Right atrium: The atrium was mildly dilated. - Tricuspid valve: There was moderate regurgitation. - Pulmonary arteries: PA peak pressure: 51 mm Hg (S). - Inferior vena cava: The vessel was normal in size. The respirophasic diameter changes were in the normal range (>= 50%), consistent with normal central venous pressure.  Impressions:  - Compared with the prior study in 09/2013, there is clearly severe bioprosthetic aortic valve stenosis. There is also moderate insufficiency and what appears to be calclified material that prolapses into the LVOT on aortic valve closure. Biventricular filling pressures are very high.  RISK SCORES - Aortic Valve Replacement About the STS Risk Calculator Procedure: AV Replacement Risk of Mortality: 13.631% Morbidity or Mortality: 44.449% Long Length of Stay: 25.912% Short Length of Stay: 8.128% Permanent Stroke: 2.882% Prolonged Ventilation: 31.675% DSW Infection: 0.21% Renal Failure: 26.435% Reoperation:  12.595%  ASSESSMENT AND PLAN: 1.  Acute on chronic diastolic heart failure: NYHA Functional Class 3. The patient has progressive fatigue and shortness of breath with clinical signs of heart failure on exam. This is complicated by worsening renal function.  Will increase lasix to 120 mg BID. I think metolazone is a bad idea in this elderly gentleman with significant renal disease. Suspect he will require hospital admission over the next week unless he makes significant improvement.   2. Severe bioprosthetic aortic stenosis/insufficiency: concerned that this is the primary reason his CHF has worsened. I have personally reviewed his echo images and there is progressive degeneration of the patient's aortic valve bioprosthesis after just 3 years from surgery. He now has moderate AI and severe AS. I discussed options with the patient and his wife at length. They understand treatment options may be limited at his advanced age with major comorbidities. His STS mortality risk is > 13% and he clearly would not be a candidate for conventional redo aortic valve surgery. However, he may be a candidate for TAVR. Complicating factors include advanced kidney disease and pancytopenia. Before proceeding with further workup, I am going to refer him to [facility] for a formal cardiac surgical consultation. While the patient has major comorbid conditions and advanced age, he remains functionally independent. He still keeps a garden and does light work in the yard. Pending surgical evaluation, will consider cardiac cath and CT angiography. We also discussed treatment options if he decides to pursue treatment. His initial aortic surgery was at [facility] and he would consider treatment at [facility] versus returning to [facility].   3. AKI on CKD IV: suspect partly due to heart failure. However, his wife reports he's been using a lot of NSAID's. Advised complete cessation of NSAIDs. Will repeat labs next week.  4. Pancytopenia (plt < 50K): followed  closely in the hematology clinic by Dr Benay Spice. Thought to have a chronic lymphoproliferative disorder.   Current medicines are reviewed with the patient today.  The patient does not have concerns regarding medicines.  The following changes have been made:  Increase furosemide to 120 mg BID  Labs/ tests ordered today include:   Orders Placed This Encounter  Procedures  . Basic Metabolic Panel (BMET)  . Ambulatory referral to Cardiothoracic Surgery    Disposition:   FU 1 week  Signed, [name], MD  03/16/2014 10:27 AM    [facility] [place], [place], [place]  [address] Phone: [phone]; Fax: [phone]

## 2014-03-18 ENCOUNTER — Institutional Professional Consult (permissible substitution) (INDEPENDENT_AMBULATORY_CARE_PROVIDER_SITE_OTHER): Payer: Commercial Managed Care - HMO | Admitting: Surgery

## 2014-03-18 ENCOUNTER — Encounter: Payer: Self-pay | Admitting: Surgery

## 2014-03-18 VITALS — BP 137/47 | HR 52 | Resp 19 | Ht 70.5 in | Wt 185.0 lb

## 2014-03-18 DIAGNOSIS — I350 Nonrheumatic aortic (valve) stenosis: Secondary | ICD-10-CM

## 2014-03-19 ENCOUNTER — Encounter: Payer: Self-pay | Admitting: Surgery

## 2014-03-19 NOTE — Progress Notes (Signed)
Cardiothoracic Surgery Consultation   PCP is [name], MD Referring Provider is [name], MD  Chief Complaint  Patient presents with  . Aortic Stenosis    TAVR eval, severe aortic stenosis    HPI:  The patient is a 81 year old gentleman with a history of hypertension, stage 3 CKD, atrial fibrillation and chronic anemia and thrombocytopenia of unclear etiology followed by Dr. Benay Spice who had an AVR in January 2013 by Dr. Evelina Dun at [facility] through a left anterior thoracotomy incision. He had a 25 mm Mosaic Ultra porcine valve. He apparently had no coronary disease at that time. He says he did well until Nov 2014 when he was admitted with fatigue, malaise and shortness of breath and had positive blood cultures growing strep bovis. 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 He had a negative orthopantogram at that time and had a CT virtual colonoscopy on 02/09/13 that showed multiple colon polyps. He says that he recovered well and was in his usual active state until about 3 months ago when he started having exertional fatigue and some shortness of breath. This has progressed and he says that he can't walk up any incline without giving out. He has had some left chest pains that sound fleeting and not related to exertion. He denies dizziness and syncope. He has had swelling in his legs chronically which has not changed. His echo on 12/14/2012 showed a mean gradient of 27 mm Hg across the valve with trace AI. A follow up echo on 09/21/2013 showed a mean gradient of 29 mm Hg with moderate peri-valvular AI that was very eccentric. His most recent echo of 03/11/2014 shows significant degeneration of the aortic valve prosthesis with calcified leaflets and a small amount of echogenic material prolapsing in the LVOT with valve closure. The mean gradient has increased to 41 mm Hg with  moderate AI.  Past Medical History  Diagnosis Date  . Stroke   . Atrial fibrillation     a. amiodarone therapy; not felt to be a candidate for anticoagulation - bruises easily.  . Bradycardia     a. only tolerates metoprolol 12.5mg  daily (bradycardic w/ bid dosing).  . Aortic stenosis     a. Previously severe -> s/p minimally invasive tissue AVR with Dr. Evelina Dun at [facility] 01/2011 (pre-AVR cath with no obs CAD);  b. 12/2012 TEE: EF 60-65%, no veg, mild AS, triv AI.  [name] Kitchen Hypertension   . Anemia   . Action tremor   . Thrombocytopenia     Dr. Benay Spice  . Colon polyp   . Anxiety   . Splenomegaly   . Gout   . GERD (gastroesophageal reflux disease)   . Hypothyroidism   . Leukopenia     Chronic pancytopenia  . Degenerative disc disease   . Joint effusion, knee     left knee  . Synovial cyst of popliteal space   . Cellulitis of left leg 10/11-16/2012  . CKD (chronic kidney disease), stage III   . Chronic diastolic CHF (congestive heart failure)     a. 12/15/2012 TEE: EF 60-65%, no veg.  . Bacteremia     a. 12/2012 - S bovis;  b. 12/2012 TEE w/o veg;  c. Seeing ID->Rocephin therapy extended to 01/25/2013 via PICC for possible endocarditis (No veg on TEE).  . High cholesterol   . Heart murmur   . Diabetes mellitus     "  borderline" (01/05/2013)  . History of blood transfusion 01/2011; 11/2012    "related to heart valve replaced; low HgB count" (01/05/2013)  . H/O hiatal hernia   . Throat cancer     s/p lasered  . Diverticulosis     Past Surgical History  Procedure Laterality Date  . Cholecystectomy    . Joint effusion      left knee  . Aortic valve replacement  01/23/2011    via minimally invasive approach per Dr Evelina Dun, [facility]  . Tee without cardioversion N/A 12/15/2012    Procedure: TRANSESOPHAGEAL ECHOCARDIOGRAM (TEE);  Surgeon: [name], MD;  Location: [facility] ENDOSCOPY;  Service: Cardiovascular;  Laterality: N/A;  . Cardiac valve replacement    . Inguinal hernia repair Right     . Excisional hemorrhoidectomy    . Cardiac catheterization    . Surgery scrotal / testicular      "removed one" (01/05/2013)  . Microlaryngoscopy with co2 laser and excision of vocal cord lesion  1980's    "throat cancer on his vocal cord; had it lasered; never had chemo; later had to laser off the scar tissue"    Family History  Problem Relation Age of Onset  . Colon cancer    . Stroke    . Cancer Other     colon  . Stroke Other     Social History History  Substance Use Topics  . Smoking status: Former Smoker -- 0.75 packs/day for 24 years    Types: Cigarettes    Quit date: 01/11/1962  . Smokeless tobacco: Never Used  . Alcohol Use: No    Current Outpatient Prescriptions  Medication Sig Dispense Refill  . allopurinol (ZYLOPRIM) 100 MG tablet Take 1 tablet (100 mg total) by mouth daily. 90 tablet 0  . amiodarone (PACERONE) 200 MG tablet Take 1 tablet (200 mg total) by mouth daily. 90 tablet 1  . amLODipine (NORVASC) 5 MG tablet Take 1 tablet (5 mg total) by mouth daily. 90 tablet 1  . aspirin 325 MG tablet Take 325 mg by mouth daily.      . Cholecalciferol (VITAMIN D) 1000 UNITS capsule Take 1 capsule (1,000 Units total) by mouth daily. 90 capsule 3  . clonazePAM (KLONOPIN) 0.5 MG tablet 1/2 as needed if you wake up early 30 tablet 1  . colchicine 0.6 MG tablet Take 0.6 mg by mouth daily as needed (gout flares). Take one tablet by mouth daily during an acute episode of gout.    . fish oil-omega-3 fatty acids 1000 MG capsule Take 1 g by mouth daily.      . Flaxseed, Linseed, (FLAX SEED OIL) 1000 MG CAPS Take 1 capsule by mouth daily.     . furosemide (LASIX) 80 MG tablet Take 1.5 tablets (120 mg total) by mouth 2 (two) times daily. 90 tablet 1  . levothyroxine (LEVOTHROID) 25 MCG tablet Take 1 tablet (25 mcg total) by mouth daily. 90 tablet 1  . Multiple Vitamin (MULTIVITAMIN) tablet Take 1 tablet by mouth daily.      . mupirocin ointment (BACTROBAN) 2 % Applied twice a day  to the affected area 22 g 0  . omeprazole (PRILOSEC) 20 MG capsule Take 1 capsule (20 mg total) by mouth daily. 90 capsule 1  . potassium chloride (K-DUR) 10 MEQ tablet Take 1 tablet (10 mEq total) by mouth daily. 90 tablet 1  . propranolol (INDERAL) 10 MG tablet Take 1 tablet (10 mg total) by mouth 2 (two) times daily. Ida  tablet 1  . sertraline (ZOLOFT) 50 MG tablet Take 50 mg by mouth daily.    . vitamin C (ASCORBIC ACID) 500 MG tablet Take 1 tablet (500 mg total) by mouth daily. 90 tablet 3   No current facility-administered medications for this visit.    Allergies  Allergen Reactions  . Penicillins Other (See Comments)    Does not remember reaction (~year 1948)  . Neomycin-Bacitracin Zn-Polymyx Other (See Comments)    ? Reaction (thinks he remembers redness)    Review of Systems  Constitutional: Positive for activity change and fatigue. Negative for fever, chills, appetite change and unexpected weight change.  HENT: Negative.        Sees his dentist regularly. Has partial dentures.  Eyes: Negative.   Respiratory: Positive for shortness of breath. Negative for cough.   Cardiovascular: Positive for chest pain and leg swelling. Negative for palpitations.  Gastrointestinal: Negative.   Endocrine: Negative.   Genitourinary: Negative.   Musculoskeletal: Negative.   Skin: Negative.   Allergic/Immunologic: Negative.   Neurological: Negative for dizziness, syncope, light-headedness and headaches.  Hematological: Bruises/bleeds easily.  Psychiatric/Behavioral:       Depression    BP 137/47 mmHg  Pulse 52  Resp 19  Ht 5' 10.5" (1.791 m)  Wt 185 lb (83.915 kg)  BMI 26.16 kg/m2  SpO2 98% Physical Exam  Constitutional: He is oriented to person, place, and time. He appears well-developed and well-nourished. No distress.  Looks good for his age  HENT:  Head: Normocephalic and atraumatic.  Mouth/Throat: Oropharynx is clear and moist.  Eyes: EOM are normal. Pupils are equal,  round, and reactive to light.  Neck: Normal range of motion. Neck supple. No JVD present. No thyromegaly present.  Transmitted murmur or bruit both side of the neck  Cardiovascular: Normal rate and regular rhythm.   Murmur heard. 3/6 systolic and diastolic murmur along RSB  Pulmonary/Chest: Effort normal and breath sounds normal. No respiratory distress. He has no rales.  Old right anterior thoracotomy scar  Abdominal: Soft. Bowel sounds are normal. He exhibits no distension and no mass. There is no tenderness.  Musculoskeletal: Normal range of motion. He exhibits edema.  Both lower legs have moderate edema to the knee and chronic venous stasis changes in skin. No open wounds  Lymphadenopathy:    He has no cervical adenopathy.  Neurological: He is alert and oriented to person, place, and time. He has normal strength. No cranial nerve deficit or sensory deficit.  Skin: Skin is warm and dry.  Chronic brownish discoloration probably related to thrombocytopenia and prior coumadin use.  Psychiatric:  Seems a little down     Diagnostic Tests:         [facility]            [address], [place] [address]              [phone]  ------------------------------------------------------------------- Transthoracic Echocardiography  Patient:  Martez, Weiand MR #:    [id number] Study Date: 03/11/2014 Gender:   M Age:    53 Height:   177.8 cm Weight:   85.3 kg BSA:    2.07 m^2 Pt. Status: Room:  ATTENDING  [name] ORDERING   [name] REFERRING  [name] SONOGRAPHER Cindy Hazy, RDCS PERFORMING  [facility], Outpatient  cc:  -------------------------------------------------------------------  ------------------------------------------------------------------- Indications:   I35.9 Aortic Valve  Disorder.  ------------------------------------------------------------------- History:  PMH: Acquired from the patient and from the patient&'s chart. PMH: Renal Insufficiency. Chronic Kidney Disease. Bacteremia. Paroxysmal Atrial Fibrillation. Sinus Bradycardia. CAD. Aortic Stenosis. CVA. Fatigue. Hypothyroidism. Degenerative Disc Disease.  ------------------------------------------------------------------- Study Conclusions  - Left ventricle: The cavity size was normal. There was moderate concentric hypertrophy. Wall motion was normal; there were no regional wall motion abnormalities. Doppler parameters are consistent with pseduonormal left ventricular relaxation (grade 2 diastolic dysfunction). The E/A ratio is 2.3. The E/e&' ratio is >20, suggesting markedly elevated LV filling pressure. - Aortic valve: Trileaflet bioprosthetic valve with significant degeneration; calcified leaflets with a small amount of echogenic material seen prolapsing into the LVOT with valve closure. Severe aortic stenosis - peak and mean gradients of 61 and 41 mmHg. There is moderate AI. Based on a measured LVOT diameter of 2.0 cm, the indexed AVA is 0.6 cm2/m2. The calculated AVA by continuity is higher, however, this neglects the moderate amount of aortic insufficiency. Valve area (VTI): 1.28 cm^2. Valve area (Vmean): 1.18 cm^2. - Mitral valve: Calcified leaflets with mild restriction. Mild regurgitation. Posterior calcified annulus. - Left atrium: Severely dilated at 66 ml/m2. - Right atrium: The atrium was mildly dilated. - Tricuspid valve: There was moderate regurgitation. - Pulmonary arteries: PA peak pressure: 51 mm Hg (S). - Inferior vena cava: The vessel was normal in size. The respirophasic diameter changes were in the normal range (>= 50%), consistent with normal central venous pressure.  Impressions:  - Compared with the prior study in 09/2013, there is  clearly severe bioprosthetic aortic valve stenosis. There is also moderate insufficiency and what appears to be calclified material that prolapses into the LVOT on aortic valve closure. Biventricular filling pressures are very high.  ------------------------------------------------------------------- Labs, prior tests, procedures, and surgery: Status post Aortic Valve Replacement.     Transthoracic echocardiography. M-mode, complete 2D, spectral Doppler, and color Doppler. Birthdate: Patient birthdate: 1922-06-16. Age: Patient is 81 yr old. Sex: Gender: male.  BMI: 27 kg/m^2. Blood pressure:   138/54 Patient status: Outpatient. Study date: Study date: 03/11/2014. Study time: 09:11 AM. Location: [facility]  -------------------------------------------------------------------  ------------------------------------------------------------------- Left ventricle: The cavity size was normal. There was moderate concentric hypertrophy. Wall motion was normal; there were no regional wall motion abnormalities. Doppler parameters are consistent with pseduonormal left ventricular relaxation (grade 2 diastolic dysfunction). The E/A ratio is 2.3. The E/e&' ratio is >20, suggesting markedly elevated LV filling pressure.  ------------------------------------------------------------------- Aortic valve: Trileaflet bioprosthetic valve with significant degeneration; calcified leaflets with a small amount of echogenic material seen prolapsing into the LVOT with valve closure. Severe aortic stenosis - peak and mean gradients of 61 and 41 mmHg. There is moderate AI. Based on a measured LVOT diameter of 2.0 cm, the indexed AVA is 0.6 cm2/m2. The calculated AVA by continuity is higher, however, this neglects the moderate amount of aortic insufficiency. Doppler:   VTI ratio of LVOT to aortic valve: 0.41. Valve area (VTI): 1.28 cm^2. Indexed valve area (VTI):  0.62 cm^2/m^2. Mean velocity ratio of LVOT to aortic valve: 0.38. Valve area (Vmean): 1.18 cm^2. Indexed valve area (Vmean): 0.57 cm^2/m^2.  Mean gradient (S): 40 mm Hg. Peak gradient (S): 71 mm Hg.  ------------------------------------------------------------------- Aorta: The aorta was mildly calcified. Aortic root: The aortic root was normal in size. Ascending aorta: The ascending aorta was normal in size.  ------------------------------------------------------------------- Mitral valve: Calcified leaflets with mild restriction. Mild regurgitation. Posterior calcified annulus. Doppler:   Peak gradient (D): 12 mm Hg.  ------------------------------------------------------------------- Left atrium: Severely  dilated at 66 ml/m2.  ------------------------------------------------------------------- Right ventricle: The cavity size was normal. Wall thickness was normal. Systolic function was normal.  ------------------------------------------------------------------- Pulmonic valve:  The valve appears to be grossly normal. Doppler: There was no significant regurgitation.  ------------------------------------------------------------------- Tricuspid valve:  Doppler: There was moderate regurgitation.  ------------------------------------------------------------------- Pulmonary artery:  The main pulmonary artery was normal-sized.  ------------------------------------------------------------------- Right atrium: The atrium was mildly dilated.  ------------------------------------------------------------------- Pericardium: There was no pericardial effusion.  ------------------------------------------------------------------- Systemic veins: Inferior vena cava: The vessel was normal in size. The respirophasic diameter changes were in the normal range (>= 50%), consistent with normal central venous  pressure.  ------------------------------------------------------------------- Post procedure conclusions Ascending Aorta:  - The aorta was mildly calcified.  ------------------------------------------------------------------- Measurements  Left ventricle              Value     Reference LV ID, ED, PLAX chordal          50.8 mm    43 - 52 LV ID, ES, PLAX chordal          28.8 mm    23 - 38 LV fx shortening, PLAX chordal      43  %    >=29 LV PW thickness, ED            13.2 mm    --------- IVS/LV PW ratio, ED            1.06      <=1.3 LV e&', lateral              10.7 cm/s   --------- LV E/e&', lateral             16.45     --------- LV e&', medial               5.26 cm/s   --------- LV E/e&', medial              33.46     --------- LV e&', average              7.98 cm/s   --------- LV E/e&', average             22.06     ---------  Ventricular septum            Value     Reference IVS thickness, ED             14  mm    ---------  LVOT                   Value     Reference LVOT ID, S                20  mm    --------- LVOT area                 3.14 cm^2   --------- LVOT peak velocity, S           158  cm/s   --------- LVOT mean velocity, S           114  cm/s   --------- LVOT VTI, S                42.8 cm    --------- LVOT peak gradient, S           10  mm Hg  --------- Stroke volume (SV), LVOT DP        134.5 ml    --------- Stroke index (SV/bsa),  LVOT DP      65.1 ml/m^2  ---------  Aortic valve               Value     Reference Aortic valve mean velocity, S        303  cm/s   --------- Aortic valve VTI, S            105  cm    --------- Aortic mean gradient, S          40  mm Hg  --------- Aortic peak gradient, S          71  mm Hg  --------- VTI ratio, LVOT/AV            0.41      --------- Aortic valve area, VTI          1.28 cm^2   --------- Aortic valve area/bsa, VTI        0.62 cm^2/m^2 --------- Velocity ratio, mean, LVOT/AV       0.38      --------- Aortic valve area, mean velocity     1.18 cm^2   --------- Aortic valve area/bsa, mean        0.57 cm^2/m^2 --------- velocity Aortic regurg pressure half-time     448  ms    ---------  Aorta                   Value     Reference Aortic root ID, ED            33  mm    ---------  Left atrium                Value     Reference LA ID, A-P, ES              57  mm    --------- LA ID/bsa, A-P          (H)   2.76 cm/m^2  <=2.2 LA volume, S               136  ml    --------- LA volume/bsa, S             65.8 ml/m^2  --------- LA volume, ES, 1-p A4C          133  ml    --------- LA volume/bsa, ES, 1-p A4C        64.4 ml/m^2  --------- LA volume, ES, 1-p A2C          130  ml    --------- LA volume/bsa, ES, 1-p A2C        62.9 ml/m^2  ---------  Mitral valve               Value     Reference Mitral E-wave peak velocity        176  cm/s   --------- Mitral A-wave peak velocity        75.4 cm/s   --------- Mitral deceleration time     (H)   317  ms    150 - 230 Mitral peak gradient, D          12  mm Hg  --------- Mitral E/A ratio, peak          2.3      ---------  Pulmonary arteries             Value     Reference PA pressure, S, DP        (  H)   51  mm Hg  <=30  Tricuspid valve              Value     Reference Tricuspid regurg peak velocity      341  cm/s   --------- Tricuspid peak RV-RA gradient       47  mm Hg  ---------  Right ventricle              Value     Reference RV s&', lateral, S             17.8 cm/s   ---------  Legend: (L) and (H) mark values outside specified reference range.  ------------------------------------------------------------------- Prepared and Electronically Authenticated by  Lyman Bishop MD 2016-03-03T15:00:33   STS Risk Score  Procedure: AV Replacement Risk of Mortality: 13.631% Morbidity or Mortality: 44.449% Long Length of Stay: 25.912% Short Length of Stay: 8.128% Permanent Stroke: 2.882% Prolonged Ventilation: 31.675% DSW Infection: 0.21% Renal Failure: 26.435% Reoperation: 12.595%   Impression:  I have personally reviewed his echocardiograms from 12/14/2012, 12/15/2012, 09/21/2013, and 03/11/2014.  He has a 3 month history or progressive exertional fatigue and shortness of breath with a 25 mm porcine aortic valve prosthesis that was placed in Jan 2013 and is now severely stenotic with a mean gradient of 40 mm Hg and has moderate AI. His valve looks degenerated on his 2D echo although the detail is not great. I am concerned about whether the AI is through the valve or peri-valvular and I think a TEE is indicated to assess this. He had a TEE in Dec 2014 when he presented with bacteremia and the valve looked normal with no AI or peri-valvular leak. I think it is important to determine this since a TAVR valve would not improve peri-valvular leak. I am surprised that his valve has deteriorated that much since Dec 2014 and it may be that he did have or still has endocarditis. I don't know if it would be worth sending random blood cultures  since he has no fever or chills and he has chronic leukopenia. We will see what the TEE looks like. His high gradient could partly be due to the flow from his AI. I don't think he would be a candidate for an open redo AVR at his age with his multiple medical problems. TAVR may be a reasonable alternative depending on what we see on TEE. TAVR workup would be prolonged due to his chronic kidney disease with a creatinine of 2.43 and a GFR of 27 on 03/11/2014. I did review the echo findings with the patient and his wife and daughter and discussed the treatment options of conventional open AVR (not an option for him), TAVR, and palliative medical therapy. They would like to pursue TAVR if we feel it is an option.   Plan:  I will discuss the case with Dr. Burt Knack and have a TEE scheduled.  Gaye Pollack, MD [facility] [phone]

## 2014-03-22 ENCOUNTER — Other Ambulatory Visit: Payer: Commercial Managed Care - HMO

## 2014-03-22 ENCOUNTER — Ambulatory Visit: Payer: Commercial Managed Care - HMO | Admitting: Oncology

## 2014-03-23 ENCOUNTER — Other Ambulatory Visit (INDEPENDENT_AMBULATORY_CARE_PROVIDER_SITE_OTHER): Payer: Commercial Managed Care - HMO | Admitting: *Deleted

## 2014-03-23 DIAGNOSIS — I350 Nonrheumatic aortic (valve) stenosis: Secondary | ICD-10-CM

## 2014-03-23 LAB — BASIC METABOLIC PANEL
BUN: 70 mg/dL — ABNORMAL HIGH (ref 6–23)
CHLORIDE: 107 meq/L (ref 96–112)
CO2: 24 meq/L (ref 19–32)
Calcium: 9.3 mg/dL (ref 8.4–10.5)
Creatinine, Ser: 2.34 mg/dL — ABNORMAL HIGH (ref 0.40–1.50)
GFR: 27.97 mL/min — ABNORMAL LOW (ref >60.00)
GLUCOSE: 238 mg/dL — AB (ref 70–99)
POTASSIUM: 4.2 meq/L (ref 3.5–5.1)
SODIUM: 138 meq/L (ref 135–145)

## 2014-03-23 NOTE — Addendum Note (Signed)
Addended by: Eulis Foster on: 03/23/2014 09:54 AM   Modules accepted: Orders

## 2014-03-24 ENCOUNTER — Ambulatory Visit (INDEPENDENT_AMBULATORY_CARE_PROVIDER_SITE_OTHER): Payer: Commercial Managed Care - HMO | Admitting: Cardiovascular Disease

## 2014-03-24 ENCOUNTER — Telehealth: Payer: Self-pay | Admitting: Oncology

## 2014-03-24 ENCOUNTER — Telehealth: Payer: Self-pay | Admitting: *Deleted

## 2014-03-24 ENCOUNTER — Encounter: Payer: Commercial Managed Care - HMO | Admitting: Surgery

## 2014-03-24 ENCOUNTER — Encounter: Payer: Self-pay | Admitting: Cardiovascular Disease

## 2014-03-24 VITALS — BP 140/42 | HR 49 | Ht 70.5 in | Wt 188.0 lb

## 2014-03-24 DIAGNOSIS — I359 Nonrheumatic aortic valve disorder, unspecified: Secondary | ICD-10-CM

## 2014-03-24 DIAGNOSIS — I350 Nonrheumatic aortic (valve) stenosis: Secondary | ICD-10-CM

## 2014-03-24 NOTE — Telephone Encounter (Signed)
Pt's wife called states pt saw Dr. Burt Knack and blood count is low and needs to get in sooner to see Dr. Benay Spice. Pt's wife confirmed labs/ov ... KJ

## 2014-03-24 NOTE — Telephone Encounter (Signed)
Pt's wife called to get pt scheduled earlier per Dr. [name] blood count is down and needs to be seen sooner than April. Pt was scheduled 03/14 but pt r/s was going out of town. Due to pt's health they cancelled their trip and I s/w Amy confirming labs/ov for 03/17 with Dr. Benay Spice. Pt's wife confirmed labs/ov.... KJ

## 2014-03-24 NOTE — Patient Instructions (Addendum)
Your physician has requested that you have a TEE. During a TEE, sound waves are used to create images of your heart. It provides your doctor with information about the size and shape of your heart and how well your heart's chambers and valves are working. In this test, a transducer is attached to the end of a flexible tube that's guided down your throat and into your esophagus (the tube leading from you mouth to your stomach) to get a more detailed image of your heart. You are not awake for the procedure. Please see the instruction sheet given to you today. For further information please visit [email or web address].  Please contact Dr Gearldine Shown office for Epogen appointment.   We will contact you next week after Dr Burt Knack reviews TEE report.   Your physician has recommended you make the following change in your medication:  1. DECREASE Furosemide to 80mg  take one by mouth twice a day (pt aware of instructions by phone on 03/25/14)

## 2014-03-24 NOTE — Telephone Encounter (Signed)
Transferred call to scheduler.  Wife was disconnected or lost call.  "Wants to reschedule after seeing cardiologist today his white cell count and hgb are low.  Would like appointment firtst of next week."

## 2014-03-25 ENCOUNTER — Ambulatory Visit (HOSPITAL_COMMUNITY)
Admission: RE | Admit: 2014-03-25 | Discharge: 2014-03-25 | Disposition: A | Discharge status: Home / Self Care | Payer: Commercial Managed Care - HMO | Source: Ambulatory Visit

## 2014-03-25 ENCOUNTER — Ambulatory Visit (HOSPITAL_BASED_OUTPATIENT_CLINIC_OR_DEPARTMENT_OTHER): Payer: Commercial Managed Care - HMO | Admitting: Oncology

## 2014-03-25 ENCOUNTER — Ambulatory Visit (HOSPITAL_COMMUNITY)
Admission: RE | Admit: 2014-03-25 | Discharge: 2014-03-25 | Disposition: A | Discharge status: Home / Self Care | Payer: Commercial Managed Care - HMO | Source: Ambulatory Visit | Attending: Oncology | Admitting: Oncology

## 2014-03-25 ENCOUNTER — Other Ambulatory Visit (HOSPITAL_BASED_OUTPATIENT_CLINIC_OR_DEPARTMENT_OTHER): Payer: Commercial Managed Care - HMO

## 2014-03-25 ENCOUNTER — Other Ambulatory Visit: Payer: Self-pay | Admitting: Cardiovascular Disease

## 2014-03-25 ENCOUNTER — Telehealth: Payer: Self-pay | Admitting: Oncology

## 2014-03-25 VITALS — BP 160/35 | HR 48 | Temp 97.9°F | Resp 17

## 2014-03-25 VITALS — BP 166/37 | HR 48 | Temp 97.7°F | Resp 17 | Ht 70.0 in | Wt 189.2 lb

## 2014-03-25 DIAGNOSIS — I480 Paroxysmal atrial fibrillation: Secondary | ICD-10-CM

## 2014-03-25 DIAGNOSIS — N289 Disorder of kidney and ureter, unspecified: Secondary | ICD-10-CM

## 2014-03-25 DIAGNOSIS — D649 Anemia, unspecified: Secondary | ICD-10-CM

## 2014-03-25 DIAGNOSIS — D61818 Other pancytopenia: Secondary | ICD-10-CM

## 2014-03-25 DIAGNOSIS — I359 Nonrheumatic aortic valve disorder, unspecified: Secondary | ICD-10-CM

## 2014-03-25 DIAGNOSIS — D479 Neoplasm of uncertain behavior of lymphoid, hematopoietic and related tissue, unspecified: Secondary | ICD-10-CM

## 2014-03-25 DIAGNOSIS — R161 Splenomegaly, not elsewhere classified: Secondary | ICD-10-CM

## 2014-03-25 LAB — CBC WITH DIFFERENTIAL/PLATELET
BASO%: 0.6 % (ref 0.0–2.0)
BASOS ABS: 0 10*3/uL (ref 0.0–0.1)
EOS ABS: 0 10*3/uL (ref 0.0–0.5)
EOS%: 1.9 % (ref 0.0–7.0)
HCT: 25.2 % — ABNORMAL LOW (ref 38.4–49.9)
HGB: 7.8 g/dL — ABNORMAL LOW (ref 13.0–17.1)
LYMPH%: 15.6 % (ref 14.0–49.0)
MCH: 25.9 pg — AB (ref 27.2–33.4)
MCHC: 31 g/dL — AB (ref 32.0–36.0)
MCV: 83.7 fL (ref 79.3–98.0)
MONO#: 0.1 10*3/uL (ref 0.1–0.9)
MONO%: 6.5 % (ref 0.0–14.0)
NEUT#: 1.2 10*3/uL — ABNORMAL LOW (ref 1.5–6.5)
NEUT%: 75.4 % — ABNORMAL HIGH (ref 39.0–75.0)
Platelets: 25 10*3/uL — ABNORMAL LOW (ref 140–400)
RBC: 3.01 10*6/uL — ABNORMAL LOW (ref 4.20–5.82)
RDW: 18.5 % — AB (ref 11.0–14.6)
WBC: 1.5 10*3/uL — ABNORMAL LOW (ref 4.0–10.3)
lymph#: 0.2 10*3/uL — ABNORMAL LOW (ref 0.9–3.3)
nRBC: 0 % (ref 0–0)

## 2014-03-25 LAB — HOLD TUBE, BLOOD BANK

## 2014-03-25 LAB — TECHNOLOGIST REVIEW

## 2014-03-25 LAB — DRAW EXTRA CLOT TUBE

## 2014-03-25 MED ORDER — SODIUM CHLORIDE 0.9 % IV SOLN: 250.0000 mL | Freq: Once | INTRAVENOUS | Status: DC

## 2014-03-25 MED ORDER — FUROSEMIDE 80 MG PO TABS: 80.0000 mg | ORAL_TABLET | Freq: Two times a day (BID) | ORAL | Status: DC

## 2014-03-25 NOTE — Procedures (Signed)
Dx: Symptomatic anemia MD: Benay Spice Transfused 2 units of blood, IV started and discontinued Patient tolerated infusion, No signs of blood reaction Patient left alert oriented and ambulatory accompanied by wife

## 2014-03-25 NOTE — Telephone Encounter (Signed)
Gave avs & calendar for April. °

## 2014-03-25 NOTE — Progress Notes (Signed)
  [place] OFFICE PROGRESS NOTE   Diagnosis: Pancytopenia  INTERVAL HISTORY:   Mr. Bohlken returns prior to a scheduled visit. He reports increased malaise. No dyspnea or bleeding. He is scheduled for a TEE tomorrow to evaluate the aortic valve. He reports Drs. Cooper and Howard are considering a transarterial valve repair.  Objective:  Vital signs in last 24 hours:  Blood pressure 166/37, pulse 48, temperature 97.7 F (36.5 C), temperature source Oral, resp. rate 17, height _0  (1.778 m), weight 189 lb 3.2 oz (85.821 kg), SpO2 100 %.    HEENT: Neck without mass Lymphatics: No cervical, supraclavicular, or axillary nodes Resp: Lungs clear bilaterally Cardio: Regular rate and rhythm, 2/6 systolic and diastolic murmurs GI: The spleen is palpable in the left mid abdomen Vascular: Trace edema at the left greater than right lower leg   Lab Results:  Lab Results  Component Value Date   WBC 1.5* 03/25/2014   HGB 7.8* 03/25/2014   HCT 25.2* 03/25/2014   MCV 83.7 03/25/2014   PLT 25* 03/25/2014   NEUTROABS 1.2* 03/25/2014   03/23/2014: BUN 70, creatinine 2.34 Medications: I have reviewed the patient's current medications.  Assessment/Plan: 1. Chronic pancytopenia/splenomegaly-likely related to a chronic lymphoproliferative disorder,? Splenic lymphoma   Bone marrow biopsy at [facility] November 2012-slightly hypercellular marrow with trilineage hematopoiesis, interstitial Nieman-Pick like histiocytosis. Negative for dysplasia, negative for lymphoma, negative for increased blasts. 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 History of severe aortic stenosis, status post aortic valve replacement surgery at [facility] on 01/23/2011  3. History of gout  4. Diabetes  5. Streptococcus bacteremia-TEE negative for endocarditis  6. Malaise-likely multifactorial 7. Renal insufficiency  8. Paroxysmal atrial fibrillation   9. Colon polyps noted on a virtual colonoscopy 02/10/2013  10. Anemia secondary to renal insufficiency and the chronic lymphoproliferative disorder   Disposition:  Mr. Minion has a complex medical history. He has pancytopenia and progressive anemia. Anemia is likely related to renal insufficiency and a chronic hematopoietic disorder. He has a long standing history of splenomegaly, potentially related to histiocytosis. The thrombocytopenia has responded to thrombopoietin in the past.  He will be transfused with 2 units of packed red blood cells to help the malaise and in anticipation of upcoming procedures. He will return for an office visit in approximately 2 weeks. We will consider a trial of erythropoietin at that visit. I discussed the risks/benefits of erythropoietin with Mr. Retana and his wife. He agrees to proceed. We will initiate thrombopoietin receptor agonist therapy if the platelet count needs to be higher for invasive procedures.  Betsy Coder, MD  03/25/2014  2:50 PM

## 2014-03-25 NOTE — Progress Notes (Addendum)
Cardiology Office Note   Date:  03/25/2014   ID:  [name], DOB [birth year], MRN [id number]  PCP:  [name], MD  Cardiologist:  [name], MD    Chief Complaint  Patient presents with  . Aortic Stenosis    fatigue     History of Present Illness: [name] is a 81 y.o. male who presents for follow-up of diastolic heart failure and aortic valve disease. He was just seen last week and has been seen by [name] in the interim.   The patient is followed for atrial fibrillation, aortic stenosis status post bioprosthetic aortic valve replacement, bradycardia, diastolic heart failure, and coronary artery disease. The patient has not been anticoagulated because of pancytopenia with platelets less than 50,000.  He underwent bioprosthetic aortic valve replacement at [facility] in 2013. He was initially considered for the CoreValve Trial but was excluded because of platelet count less than 50,000. He ultimately underwent surgical aortic valve replacement via a right mini thoracotomy with a 25 mm Mosaic Ultra porcine prosthesis. I cannot find a record of cardiac catheterization but in review of notes he had no significant coronary artery disease.  He has had progression of fatigue and DOE. Echo shows progressive bioprosthetic valve dysfunction with aortic stenosis and insufficiency. He is scheduled for TEE later this week with [name].   The patient has lost about 5 pounds since his lasix was increased last week. Leg swelling improved. No change in DOE/fatigue. No other complaints and specifically denies fever, chills, or cough. He is here with his wife and daughter today.  Past Medical History  Diagnosis Date  . Stroke   . Atrial fibrillation     a. amiodarone therapy; not felt to be a candidate for anticoagulation - bruises easily.  . Bradycardia     a. only tolerates metoprolol 12.5mg  daily (bradycardic w/ bid dosing).  . Aortic stenosis     a.  Previously severe -> s/p minimally invasive tissue AVR with Dr. Evelina Dun at [facility] 01/2011 (pre-AVR cath with no obs CAD);  b. 12/2012 TEE: EF 60-65%, no veg, mild AS, triv AI.  [name] Kitchen Hypertension   . Anemia   . Action tremor   . Thrombocytopenia     Dr. Benay Spice  . Colon polyp   . Anxiety   . Splenomegaly   . Gout   . GERD (gastroesophageal reflux disease)   . Hypothyroidism   . Leukopenia     Chronic pancytopenia  . Degenerative disc disease   . Joint effusion, knee     left knee  . Synovial cyst of popliteal space   . Cellulitis of left leg 10/11-16/2012  . CKD (chronic kidney disease), stage III   . Chronic diastolic CHF (congestive heart failure)     a. 12/15/2012 TEE: EF 60-65%, no veg.  . Bacteremia     a. 12/2012 - S bovis;  b. 12/2012 TEE w/o veg;  c. Seeing ID->Rocephin therapy extended to 01/25/2013 via PICC for possible endocarditis (No veg on TEE).  . High cholesterol   . Heart murmur   . Diabetes mellitus     "borderline" (01/05/2013)  . History of blood transfusion 01/2011; 11/2012    "related to heart valve replaced; low HgB count" (01/05/2013)  . H/O hiatal hernia   . Throat cancer     s/p lasered  . Diverticulosis     Past Surgical History  Procedure Laterality Date  . Cholecystectomy    .  Joint effusion      left knee  . Aortic valve replacement  01/23/2011    via minimally invasive approach per Dr Evelina Dun, [facility]  . Tee without cardioversion N/A 12/15/2012    Procedure: TRANSESOPHAGEAL ECHOCARDIOGRAM (TEE);  Surgeon: [name], MD;  Location: [facility] ENDOSCOPY;  Service: Cardiovascular;  Laterality: N/A;  . Cardiac valve replacement    . Inguinal hernia repair Right   . Excisional hemorrhoidectomy    . Cardiac catheterization    . Surgery scrotal / testicular      "removed one" (01/05/2013)  . 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 allopurinol (ZYLOPRIM) 100 MG tablet Take 1 tablet (100 mg total) by mouth daily. 90 tablet 0  . amiodarone (PACERONE) 200 MG tablet Take 1 tablet (200 mg total) by mouth daily. 90 tablet 1  . amLODipine (NORVASC) 5 MG tablet Take 1 tablet (5 mg total) by mouth daily. 90 tablet 1  . aspirin 325 MG tablet Take 325 mg by mouth daily.      . Cholecalciferol (VITAMIN D) 1000 UNITS capsule Take 1 capsule (1,000 Units total) by mouth daily. 90 capsule 3  . clonazePAM (KLONOPIN) 0.5 MG tablet 1/2 as needed if you wake up early 30 tablet 1  . colchicine 0.6 MG tablet Take 0.6 mg by mouth daily as needed (gout flares). Take one tablet by mouth daily during an acute episode of gout.    . fish oil-omega-3 fatty acids 1000 MG capsule Take 1 g by mouth daily.      . Flaxseed, Linseed, (FLAX SEED OIL) 1000 MG CAPS Take 1 capsule by mouth daily.     . furosemide (LASIX) 80 MG tablet Take 1.5 tablets (120 mg total) by mouth 2 (two) times daily. 90 tablet 1  . levothyroxine (LEVOTHROID) 25 MCG tablet Take 1 tablet (25 mcg total) by mouth daily. 90 tablet 1  . Multiple Vitamin (MULTIVITAMIN) tablet Take 1 tablet by mouth daily.      . mupirocin ointment (BACTROBAN) 2 % Applied twice a day to the affected area 22 g 0  . omeprazole (PRILOSEC) 20 MG capsule Take 1 capsule (20 mg total) by mouth daily. 90 capsule 1  . potassium chloride (K-DUR) 10 MEQ tablet Take 1 tablet (10 mEq total) by mouth daily. 90 tablet 1  . propranolol (INDERAL) 10 MG tablet Take 1 tablet (10 mg total) by mouth 2 (two) times daily. 180 tablet 1  . sertraline (ZOLOFT) 50 MG tablet Take 50 mg by mouth daily.    . vitamin C (ASCORBIC ACID) 500 MG tablet Take 1 tablet (500 mg total) by mouth daily. 90 tablet 3   No current facility-administered medications for this visit.    Allergies:   Penicillins and Neomycin-bacitracin zn-polymyx   Social  History:  The patient  reports that he quit smoking about 52 years ago. His smoking use included Cigarettes. He has a 18 pack-year smoking history. He has never used smokeless tobacco. He reports that he does not drink alcohol or use illicit drugs.   Family History:  [name] family history includes Cancer in his other; Colon cancer in an other family member; Stroke in his other and another family member.    ROS:  Please see  the history of present illness. All other systems are reviewed and negative.   PHYSICAL EXAM: VS:  BP 140/42 mmHg  Pulse 49  Ht 5' 10.5" (1.791 m)  Wt 188 lb (85.276 kg)  BMI 26.58 kg/m2 , BMI Body mass index is 26.58 kg/(m^2). GEN: Elderly male, in no acute distress HEENT: normal Neck: JVP mildly elevated Cardiac: brady and regular with systolic and diastolic murmurs at the RUSB            Respiratory:  clear to auscultation bilaterally, normal work of breathing GI: soft, nontender, nondistended, + BS MS: no deformity or atrophy Ext: 1+ pretibial edema Skin: warm and dry, no rash Neuro:  Strength and sensation are intact Psych: euthymic mood, full affect  EKG:  EKG is ordered today. The ekg ordered today shows Sinus brady 49 bpm, LVH otherwise WNL  Recent Labs: 02/22/2014: Hemoglobin 8.1*; Platelets 39* 03/11/2014: ALT 17; TSH 5.15* 03/23/2014: BUN 70*; Creatinine 2.34*; Potassium 4.2; Sodium 138   Lipid Panel     Component Value Date/Time   CHOL 84 09/21/2013 0809   TRIG 94.0 09/21/2013 0809   HDL 25.60* 09/21/2013 0809   CHOLHDL 3 09/21/2013 0809   VLDL 18.8 09/21/2013 0809   LDLCALC 40 09/21/2013 0809      Wt Readings from Last 3 Encounters:  03/24/14 188 lb (85.276 kg)  03/18/14 185 lb (83.915 kg)  03/16/14 191 lb 12.8 oz (87 kg)    ASSESSMENT AND PLAN: 1.  Acute on chronic diastolic heart failure, likely secondary to valvular heart disease. Appreciate Dr Vivi Martens evaluation. Plan for TEE later this week to evaluate whether AI is within  the valve or paravalvular. Also noted to have severe AS, but suspect some relation to flow in setting of regurgitant volume. Labs show stable creatinine but increasing BUN. Probably best to reduce lasix back to 80 mg BID.  2. AKI on CKD IV: if we pursue TAVR evaluation renal function will be a major issue. He may reuqire hospitalization to optimize CHF/renal function before undergoing cardiac cath or CT. He has stopped NSAID's.   3. Pancytopenia: he will call Dr Benay Spice for consideration of erythropoieitin. Suspect fatigue is multifactorial.  Further plans pending TEE result. Likely will pursue further treatment of bioprosthetic aortic valve stenosis/insufficiency considering his clinical decline and progressive heart failure.   Current medicines are reviewed with the patient today.  The patient does not have concerns regarding medicines.  The following changes have been made:  Decrease furosemide to 80 mg BID  Labs/ tests ordered today include:  Orders Placed This Encounter  Procedures  . EKG 12-Lead    Signed, [name], MD  03/25/2014 12:02 AM    [facility] [place], [place], [place]  [address] Phone: [phone]; Fax: [phone]   Addendum 03/31/2014: Reviewed patient's TEE images. Review of Dr. Johnsie Cancel. The patient has severe aortic insufficiency and it appears to be related to a perforated leaflet on his bioprosthesis. There is an eccentric jet present. Plan will be to proceed with right and left heart catheterization. I recommended that the patient be admitted the day before his heart catheterization because of his kidney disease. We need to optimize his fluid status prior to the procedure. I spoke to the patient and his wife on the telephone and this will be arranged for next week. I will plan on admitting him Monday and arrange his heart catheterization with Dr. Angelena Form on Tuesday.  [name] 03/31/2014 6:20 PM

## 2014-03-25 NOTE — Progress Notes (Signed)
CBC reviewed by Dr. Benay Spice: Order received for blood transfusion today. Pt will be worked in at [facility]. Per Dr. Benay Spice: No Lasix with transfusion. Pt takes 80 mg PO BID.

## 2014-03-26 ENCOUNTER — Encounter (HOSPITAL_COMMUNITY)
Admission: RE | Disposition: A | Discharge status: Home / Self Care | Payer: Self-pay | Source: Ambulatory Visit | Attending: Cardiovascular Disease

## 2014-03-26 ENCOUNTER — Ambulatory Visit (HOSPITAL_COMMUNITY)
Admission: RE | Admit: 2014-03-26 | Discharge: 2014-03-26 | Disposition: A | Discharge status: Home / Self Care | Payer: Commercial Managed Care - HMO | Source: Ambulatory Visit | Attending: Cardiovascular Disease | Admitting: Cardiovascular Disease

## 2014-03-26 ENCOUNTER — Encounter (HOSPITAL_COMMUNITY): Payer: Self-pay | Admitting: *Deleted

## 2014-03-26 DIAGNOSIS — N184 Chronic kidney disease, stage 4 (severe): Secondary | ICD-10-CM | POA: Diagnosis not present

## 2014-03-26 DIAGNOSIS — E039 Hypothyroidism, unspecified: Secondary | ICD-10-CM | POA: Diagnosis not present

## 2014-03-26 DIAGNOSIS — I4891 Unspecified atrial fibrillation: Secondary | ICD-10-CM | POA: Insufficient documentation

## 2014-03-26 DIAGNOSIS — E119 Type 2 diabetes mellitus without complications: Secondary | ICD-10-CM | POA: Diagnosis not present

## 2014-03-26 DIAGNOSIS — Z8521 Personal history of malignant neoplasm of larynx: Secondary | ICD-10-CM | POA: Insufficient documentation

## 2014-03-26 DIAGNOSIS — I5033 Acute on chronic diastolic (congestive) heart failure: Secondary | ICD-10-CM | POA: Diagnosis present

## 2014-03-26 DIAGNOSIS — F419 Anxiety disorder, unspecified: Secondary | ICD-10-CM | POA: Diagnosis not present

## 2014-03-26 DIAGNOSIS — Z800 Family history of malignant neoplasm of digestive organs: Secondary | ICD-10-CM | POA: Diagnosis not present

## 2014-03-26 DIAGNOSIS — I129 Hypertensive chronic kidney disease with stage 1 through stage 4 chronic kidney disease, or unspecified chronic kidney disease: Secondary | ICD-10-CM | POA: Insufficient documentation

## 2014-03-26 DIAGNOSIS — Z881 Allergy status to other antibiotic agents status: Secondary | ICD-10-CM | POA: Diagnosis not present

## 2014-03-26 DIAGNOSIS — D696 Thrombocytopenia, unspecified: Secondary | ICD-10-CM | POA: Diagnosis not present

## 2014-03-26 DIAGNOSIS — Z9861 Coronary angioplasty status: Secondary | ICD-10-CM | POA: Insufficient documentation

## 2014-03-26 DIAGNOSIS — Z9049 Acquired absence of other specified parts of digestive tract: Secondary | ICD-10-CM | POA: Diagnosis not present

## 2014-03-26 DIAGNOSIS — M109 Gout, unspecified: Secondary | ICD-10-CM | POA: Insufficient documentation

## 2014-03-26 DIAGNOSIS — Z952 Presence of prosthetic heart valve: Secondary | ICD-10-CM | POA: Diagnosis not present

## 2014-03-26 DIAGNOSIS — D61818 Other pancytopenia: Secondary | ICD-10-CM | POA: Diagnosis not present

## 2014-03-26 DIAGNOSIS — K219 Gastro-esophageal reflux disease without esophagitis: Secondary | ICD-10-CM | POA: Diagnosis not present

## 2014-03-26 DIAGNOSIS — I350 Nonrheumatic aortic (valve) stenosis: Secondary | ICD-10-CM | POA: Insufficient documentation

## 2014-03-26 DIAGNOSIS — Z7982 Long term (current) use of aspirin: Secondary | ICD-10-CM | POA: Diagnosis not present

## 2014-03-26 DIAGNOSIS — Z87891 Personal history of nicotine dependence: Secondary | ICD-10-CM | POA: Insufficient documentation

## 2014-03-26 DIAGNOSIS — Z880 Allergy status to penicillin: Secondary | ICD-10-CM | POA: Insufficient documentation

## 2014-03-26 DIAGNOSIS — E780 Pure hypercholesterolemia: Secondary | ICD-10-CM | POA: Insufficient documentation

## 2014-03-26 DIAGNOSIS — I359 Nonrheumatic aortic valve disorder, unspecified: Secondary | ICD-10-CM

## 2014-03-26 DIAGNOSIS — I340 Nonrheumatic mitral (valve) insufficiency: Secondary | ICD-10-CM

## 2014-03-26 HISTORY — PX: TEE WITHOUT CARDIOVERSION: SHX5443

## 2014-03-26 LAB — TYPE AND SCREEN
ABO/RH(D): A POS
Antibody Screen: NEGATIVE
UNIT DIVISION: 0
UNIT DIVISION: 0

## 2014-03-26 SURGERY — ECHOCARDIOGRAM, TRANSESOPHAGEAL
Anesthesia: Moderate Sedation

## 2014-03-26 MED ORDER — SODIUM CHLORIDE 0.9 % IV SOLN
INTRAVENOUS | Status: DC
  Administered 2014-03-26: 500 mL via INTRAVENOUS

## 2014-03-26 MED ORDER — DIPHENHYDRAMINE HCL 50 MG/ML IJ SOLN
INTRAMUSCULAR | Status: AC
  Filled 2014-03-26: qty 1

## 2014-03-26 MED ORDER — MIDAZOLAM HCL 5 MG/ML IJ SOLN
INTRAMUSCULAR | Status: AC
  Filled 2014-03-26: qty 2

## 2014-03-26 MED ORDER — MIDAZOLAM HCL 10 MG/2ML IJ SOLN
INTRAMUSCULAR | Status: DC | PRN
  Administered 2014-03-26 (×4): 1 mg via INTRAVENOUS
  Administered 2014-03-26: 2 mg via INTRAVENOUS

## 2014-03-26 MED ORDER — FENTANYL CITRATE 0.05 MG/ML IJ SOLN
INTRAMUSCULAR | Status: AC
  Filled 2014-03-26: qty 2

## 2014-03-26 NOTE — Interval H&P Note (Signed)
History and Physical Interval Note:  03/26/2014 11:03 AM  [name]  has presented today for surgery, with the diagnosis of AORTIC STENOSIS   The various methods of treatment have been discussed with the patient and family. After consideration of risks, benefits and other options for treatment, the patient has consented to  Procedure(s): TRANSESOPHAGEAL ECHOCARDIOGRAM (TEE) (N/A) as a surgical intervention .  [name] history has been reviewed, patient examined, no change in status, stable for surgery.  I have reviewed the patient's chart and labs.  Questions were answered to the patient's satisfaction.     Jenkins Rouge

## 2014-03-26 NOTE — H&P (View-Only) (Signed)
Cardiology Office Note   Date:  03/25/2014   ID:  [name], DOB 07/17/1922, MRN [id number]  PCP:  [name], MD  Cardiologist:  [name], MD    Chief Complaint  Patient presents with  . Aortic Stenosis    fatigue     History of Present Illness: [name] is a 81 y.o. male who presents for follow-up of diastolic heart failure and aortic valve disease. He was just seen last week and has been seen by [name] in the interim.   The patient is followed for atrial fibrillation, aortic stenosis status post bioprosthetic aortic valve replacement, bradycardia, diastolic heart failure, and coronary artery disease. The patient has not been anticoagulated because of pancytopenia with platelets less than 50,000.  He underwent bioprosthetic aortic valve replacement at [facility] in 2013. He was initially considered for the CoreValve Trial but was excluded because of platelet count less than 50,000. He ultimately underwent surgical aortic valve replacement via a right mini thoracotomy with a 25 mm Mosaic Ultra porcine prosthesis. I cannot find a record of cardiac catheterization but in review of notes he had no significant coronary artery disease.  He has had progression of fatigue and DOE. Echo shows progressive bioprosthetic valve dysfunction with aortic stenosis and insufficiency. He is scheduled for TEE later this week with [name].   The patient has lost about 5 pounds since his lasix was increased last week. Leg swelling improved. No change in DOE/fatigue. No other complaints and specifically denies fever, chills, or cough. He is here with his wife and daughter today.  Past Medical History  Diagnosis Date  . Stroke   . Atrial fibrillation     a. amiodarone therapy; not felt to be a candidate for anticoagulation - bruises easily.  . Bradycardia     a. only tolerates metoprolol 12.5mg  daily (bradycardic w/ bid dosing).  . Aortic stenosis     a.  Previously severe -> s/p minimally invasive tissue AVR with Dr. Evelina Dun at [facility] 01/2011 (pre-AVR cath with no obs CAD);  b. 12/2012 TEE: EF 60-65%, no veg, mild AS, triv AI.  [name] Kitchen Hypertension   . Anemia   . Action tremor   . Thrombocytopenia     Dr. Benay Spice  . Colon polyp   . Anxiety   . Splenomegaly   . Gout   . GERD (gastroesophageal reflux disease)   . Hypothyroidism   . Leukopenia     Chronic pancytopenia  . Degenerative disc disease   . Joint effusion, knee     left knee  . Synovial cyst of popliteal space   . Cellulitis of left leg 10/11-16/2012  . CKD (chronic kidney disease), stage III   . Chronic diastolic CHF (congestive heart failure)     a. 12/15/2012 TEE: EF 60-65%, no veg.  . Bacteremia     a. 12/2012 - S bovis;  b. 12/2012 TEE w/o veg;  c. Seeing ID->Rocephin therapy extended to 01/25/2013 via PICC for possible endocarditis (No veg on TEE).  . High cholesterol   . Heart murmur   . Diabetes mellitus     "borderline" (01/05/2013)  . History of blood transfusion 01/2011; 11/2012    "related to heart valve replaced; low HgB count" (01/05/2013)  . H/O hiatal hernia   . Throat cancer     s/p lasered  . Diverticulosis     Past Surgical History  Procedure Laterality Date  . Cholecystectomy    .  Joint effusion      left knee  . Aortic valve replacement  01/23/2011    via minimally invasive approach per Dr Evelina Dun, [facility]  . Tee without cardioversion N/A 12/15/2012    Procedure: TRANSESOPHAGEAL ECHOCARDIOGRAM (TEE);  Surgeon: [name], MD;  Location: [facility] ENDOSCOPY;  Service: Cardiovascular;  Laterality: N/A;  . Cardiac valve replacement    . Inguinal hernia repair Right   . Excisional hemorrhoidectomy    . Cardiac catheterization    . Surgery scrotal / testicular      "removed one" (01/05/2013)  . 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 allopurinol (ZYLOPRIM) 100 MG tablet Take 1 tablet (100 mg total) by mouth daily. 90 tablet 0  . amiodarone (PACERONE) 200 MG tablet Take 1 tablet (200 mg total) by mouth daily. 90 tablet 1  . amLODipine (NORVASC) 5 MG tablet Take 1 tablet (5 mg total) by mouth daily. 90 tablet 1  . aspirin 325 MG tablet Take 325 mg by mouth daily.      . Cholecalciferol (VITAMIN D) 1000 UNITS capsule Take 1 capsule (1,000 Units total) by mouth daily. 90 capsule 3  . clonazePAM (KLONOPIN) 0.5 MG tablet 1/2 as needed if you wake up early 30 tablet 1  . colchicine 0.6 MG tablet Take 0.6 mg by mouth daily as needed (gout flares). Take one tablet by mouth daily during an acute episode of gout.    . fish oil-omega-3 fatty acids 1000 MG capsule Take 1 g by mouth daily.      . Flaxseed, Linseed, (FLAX SEED OIL) 1000 MG CAPS Take 1 capsule by mouth daily.     . furosemide (LASIX) 80 MG tablet Take 1.5 tablets (120 mg total) by mouth 2 (two) times daily. 90 tablet 1  . levothyroxine (LEVOTHROID) 25 MCG tablet Take 1 tablet (25 mcg total) by mouth daily. 90 tablet 1  . Multiple Vitamin (MULTIVITAMIN) tablet Take 1 tablet by mouth daily.      . mupirocin ointment (BACTROBAN) 2 % Applied twice a day to the affected area 22 g 0  . omeprazole (PRILOSEC) 20 MG capsule Take 1 capsule (20 mg total) by mouth daily. 90 capsule 1  . potassium chloride (K-DUR) 10 MEQ tablet Take 1 tablet (10 mEq total) by mouth daily. 90 tablet 1  . propranolol (INDERAL) 10 MG tablet Take 1 tablet (10 mg total) by mouth 2 (two) times daily. 180 tablet 1  . sertraline (ZOLOFT) 50 MG tablet Take 50 mg by mouth daily.    . vitamin C (ASCORBIC ACID) 500 MG tablet Take 1 tablet (500 mg total) by mouth daily. 90 tablet 3   No current facility-administered medications for this visit.    Allergies:   Penicillins and Neomycin-bacitracin zn-polymyx   Social  History:  The patient  reports that he quit smoking about 52 years ago. His smoking use included Cigarettes. He has a 18 pack-year smoking history. He has never used smokeless tobacco. He reports that he does not drink alcohol or use illicit drugs.   Family History:  [name] family history includes Cancer in his other; Colon cancer in an other family member; Stroke in his other and another family member.    ROS:  Please see  the history of present illness. All other systems are reviewed and negative.   PHYSICAL EXAM: VS:  BP 140/42 mmHg  Pulse 49  Ht 5' 10.5" (1.791 m)  Wt 188 lb (85.276 kg)  BMI 26.58 kg/m2 , BMI Body mass index is 26.58 kg/(m^2). GEN: Elderly male, in no acute distress HEENT: normal Neck: JVP mildly elevated Cardiac: brady and regular with systolic and diastolic murmurs at the RUSB            Respiratory:  clear to auscultation bilaterally, normal work of breathing GI: soft, nontender, nondistended, + BS MS: no deformity or atrophy Ext: 1+ pretibial edema Skin: warm and dry, no rash Neuro:  Strength and sensation are intact Psych: euthymic mood, full affect  EKG:  EKG is ordered today. The ekg ordered today shows Sinus brady 49 bpm, LVH otherwise WNL  Recent Labs: 02/22/2014: Hemoglobin 8.1*; Platelets 39* 03/11/2014: ALT 17; TSH 5.15* 03/23/2014: BUN 70*; Creatinine 2.34*; Potassium 4.2; Sodium 138   Lipid Panel     Component Value Date/Time   CHOL 84 09/21/2013 0809   TRIG 94.0 09/21/2013 0809   HDL 25.60* 09/21/2013 0809   CHOLHDL 3 09/21/2013 0809   VLDL 18.8 09/21/2013 0809   LDLCALC 40 09/21/2013 0809      Wt Readings from Last 3 Encounters:  03/24/14 188 lb (85.276 kg)  03/18/14 185 lb (83.915 kg)  03/16/14 191 lb 12.8 oz (87 kg)    ASSESSMENT AND PLAN: 1.  Acute on chronic diastolic heart failure, likely secondary to valvular heart disease. Appreciate Dr Vivi Martens evaluation. Plan for TEE later this week to evaluate whether AI is within  the valve or paravalvular. Also noted to have severe AS, but suspect some relation to flow in setting of regurgitant volume. Labs show stable creatinine but increasing BUN. Probably best to reduce lasix back to 80 mg BID.  2. AKI on CKD IV: if we pursue TAVR evaluation renal function will be a major issue. He may reuqire hospitalization to optimize CHF/renal function before undergoing cardiac cath or CT. He has stopped NSAID's.   3. Pancytopenia: he will call Dr Benay Spice for consideration of erythropoieitin. Suspect fatigue is multifactorial.  Further plans pending TEE result. Likely will pursue further treatment of bioprosthetic aortic valve stenosis/insufficiency considering his clinical decline and progressive heart failure.   Current medicines are reviewed with the patient today.  The patient does not have concerns regarding medicines.  The following changes have been made:  Decrease furosemide to 80 mg BID  Labs/ tests ordered today include:  Orders Placed This Encounter  Procedures  . EKG 12-Lead    Signed, [name], MD  03/25/2014 12:02 AM    [facility] [place], [place],   [address] Phone: [phone]; Fax: [phone]

## 2014-03-26 NOTE — Progress Notes (Signed)
  Echocardiogram Echocardiogram Transesophageal has been performed.  Philipp Deputy 03/26/2014, 1:08 PM

## 2014-03-26 NOTE — CV Procedure (Signed)
TEE:  Versed 4 mg  Normal EF 60% Tissue AVR- severe AR  Will have to review with partners but I believe it is mostly from perforation at base of right leaflet and not perivalvular Mild MR Normal RV No PFO No aortic debris No pericardial effusion  See full report in [employer]  [employer]

## 2014-03-26 NOTE — Discharge Instructions (Addendum)
Ok to eat when alert and numbness worn off throat F/U Dr Burt Knack office will call     Transesophageal Echocardiogram Transesophageal echocardiography (TEE) is a picture test of your heart using sound waves. The pictures taken can give very detailed pictures of your heart. This can help your doctor see if there are problems with your heart. TEE can check:  If your heart has blood clots in it.  How well your heart valves are working.  If you have an infection on the inside of your heart.  Some of the major arteries of your heart.  If your heart valve is working after a Office manager.  Your heart before a procedure that uses a shock to your heart to get the rhythm back to normal. BEFORE THE PROCEDURE  Do not eat or drink for 6 hours before the procedure or as told by your doctor.  Make plans to have someone drive you home after the procedure. Do not drive yourself home.  An IV tube will be put in your arm. PROCEDURE  You will be given a medicine to help you relax (sedative). It will be given through the IV tube.  A numbing medicine will be sprayed or gargled in the back of your throat to help numb it.  The tip of the probe is placed into the back of your mouth. You will be asked to swallow. This helps to pass the probe into your esophagus.  Once the tip of the probe is in the right place, your doctor can take pictures of your heart.  You may feel pressure at the back of your throat. AFTER THE PROCEDURE  You will be taken to a recovery area so the sedative can wear off.  Your throat may be sore and scratchy. This will go away slowly over time.  You will go home when you are fully awake and able to swallow liquids.  You should have someone stay with you for the next 24 hours.  Do not drive or operate machinery for the next 24 hours. Document Released: 10/22/2008 Document Revised: 12/30/2012 Document Reviewed: 06/26/2012 [facility] Patient Information 2015 [place], [place]. This  information is not intended to replace advice given to you by your health care provider. Make sure you discuss any questions you have with your health care provider.  Conscious Sedation, Adult, Care After Refer to this sheet in the next few weeks. These instructions provide you with information on caring for yourself after your procedure. Your health care provider may also give you more specific instructions. Your treatment has been planned according to current medical practices, but problems sometimes occur. Call your health care provider if you have any problems or questions after your procedure. WHAT TO EXPECT AFTER THE PROCEDURE  After your procedure:  You may feel sleepy, clumsy, and have poor balance for several hours.  Vomiting may occur if you eat too soon after the procedure. HOME CARE INSTRUCTIONS  Do not participate in any activities where you could become injured for at least 24 hours. Do not:  Drive.  Swim.  Ride a bicycle.  Operate heavy machinery.  Cook.  Use power tools.  Climb ladders.  Work from a high place.  Do not make important decisions or sign legal documents until you are improved.  If you vomit, drink water, juice, or soup when you can drink without vomiting. Make sure you have little or no nausea before eating solid foods.  Only take over-the-counter or prescription medicines for pain, discomfort, or  fever as directed by your health care provider.  Make sure you and your family fully understand everything about the medicines given to you, including what side effects may occur.  You should not drink alcohol, take sleeping pills, or take medicines that cause drowsiness for at least 24 hours.  If you smoke, do not smoke without supervision.  If you are feeling better, you may resume normal activities 24 hours after you were sedated.  Keep all appointments with your health care provider. SEEK MEDICAL CARE IF:  Your skin is pale or bluish in  color.  You continue to feel nauseous or vomit.  Your pain is getting worse and is not helped by medicine.  You have bleeding or swelling.  You are still sleepy or feeling clumsy after 24 hours. SEEK IMMEDIATE MEDICAL CARE IF:  You develop a rash.  You have difficulty breathing.  You develop any type of allergic problem.  You have a fever. MAKE SURE YOU:  Understand these instructions.  Will watch your condition.  Will get help right away if you are not doing well or get worse. Document Released: 10/15/2012 Document Reviewed: 10/15/2012 [facility] Patient Information 2015 [place], [place]. This information is not intended to replace advice given to you by your health care provider. Make sure you discuss any questions you have with your health care provider.

## 2014-03-29 ENCOUNTER — Encounter (HOSPITAL_COMMUNITY): Payer: Self-pay | Admitting: Cardiovascular Disease

## 2014-03-30 ENCOUNTER — Telehealth: Payer: Self-pay | Admitting: Cardiovascular Disease

## 2014-03-30 NOTE — Telephone Encounter (Signed)
I left the pt a message that our office will follow-up with him tomorrow in regards to TEE results.

## 2014-03-30 NOTE — Telephone Encounter (Signed)
New message      Want ultrasound results from Friday at the hosp

## 2014-03-31 NOTE — Telephone Encounter (Signed)
Dr Burt Knack called and spoke with the pt about TEE findings.  Pt set up for direct admission to [facility] on 04/05/14 and then cardiac catheterization with Dr Angelena Form on 04/06/14. Pt aware that the hospital will contact him on Monday for admission.

## 2014-04-05 ENCOUNTER — Encounter (HOSPITAL_COMMUNITY): Payer: Self-pay | Admitting: Cardiovascular Disease

## 2014-04-05 ENCOUNTER — Inpatient Hospital Stay (HOSPITAL_COMMUNITY)
Admission: AD | Admit: 2014-04-05 | Discharge: 2014-04-07 | DRG: 287 | Disposition: A | Discharge status: Home / Self Care | Payer: Commercial Managed Care - HMO | Source: Ambulatory Visit | Attending: Cardiovascular Disease | Admitting: Cardiovascular Disease

## 2014-04-05 ENCOUNTER — Other Ambulatory Visit: Payer: Self-pay

## 2014-04-05 DIAGNOSIS — D61818 Other pancytopenia: Secondary | ICD-10-CM | POA: Diagnosis present

## 2014-04-05 DIAGNOSIS — Z7982 Long term (current) use of aspirin: Secondary | ICD-10-CM

## 2014-04-05 DIAGNOSIS — I352 Nonrheumatic aortic (valve) stenosis with insufficiency: Secondary | ICD-10-CM | POA: Diagnosis present

## 2014-04-05 DIAGNOSIS — F419 Anxiety disorder, unspecified: Secondary | ICD-10-CM | POA: Diagnosis present

## 2014-04-05 DIAGNOSIS — I5032 Chronic diastolic (congestive) heart failure: Secondary | ICD-10-CM | POA: Diagnosis present

## 2014-04-05 DIAGNOSIS — I4891 Unspecified atrial fibrillation: Secondary | ICD-10-CM | POA: Diagnosis present

## 2014-04-05 DIAGNOSIS — I350 Nonrheumatic aortic (valve) stenosis: Secondary | ICD-10-CM | POA: Diagnosis not present

## 2014-04-05 DIAGNOSIS — Z79899 Other long term (current) drug therapy: Secondary | ICD-10-CM

## 2014-04-05 DIAGNOSIS — I2510 Atherosclerotic heart disease of native coronary artery without angina pectoris: Secondary | ICD-10-CM | POA: Diagnosis not present

## 2014-04-05 DIAGNOSIS — Z952 Presence of prosthetic heart valve: Secondary | ICD-10-CM | POA: Diagnosis not present

## 2014-04-05 DIAGNOSIS — Y832 Surgical operation with anastomosis, bypass or graft as the cause of abnormal reaction of the patient, or of later complication, without mention of misadventure at the time of the procedure: Secondary | ICD-10-CM | POA: Diagnosis present

## 2014-04-05 DIAGNOSIS — T82897A Other specified complication of cardiac prosthetic devices, implants and grafts, initial encounter: Principal | ICD-10-CM | POA: Diagnosis present

## 2014-04-05 DIAGNOSIS — E039 Hypothyroidism, unspecified: Secondary | ICD-10-CM | POA: Diagnosis present

## 2014-04-05 DIAGNOSIS — Z8673 Personal history of transient ischemic attack (TIA), and cerebral infarction without residual deficits: Secondary | ICD-10-CM

## 2014-04-05 DIAGNOSIS — Z85819 Personal history of malignant neoplasm of unspecified site of lip, oral cavity, and pharynx: Secondary | ICD-10-CM | POA: Diagnosis not present

## 2014-04-05 DIAGNOSIS — Z87891 Personal history of nicotine dependence: Secondary | ICD-10-CM

## 2014-04-05 DIAGNOSIS — I351 Nonrheumatic aortic (valve) insufficiency: Secondary | ICD-10-CM | POA: Diagnosis present

## 2014-04-05 DIAGNOSIS — N179 Acute kidney failure, unspecified: Secondary | ICD-10-CM | POA: Diagnosis present

## 2014-04-05 DIAGNOSIS — N184 Chronic kidney disease, stage 4 (severe): Secondary | ICD-10-CM | POA: Diagnosis present

## 2014-04-05 DIAGNOSIS — K219 Gastro-esophageal reflux disease without esophagitis: Secondary | ICD-10-CM | POA: Diagnosis present

## 2014-04-05 DIAGNOSIS — R5383 Other fatigue: Secondary | ICD-10-CM | POA: Diagnosis present

## 2014-04-05 DIAGNOSIS — E119 Type 2 diabetes mellitus without complications: Secondary | ICD-10-CM | POA: Diagnosis present

## 2014-04-05 DIAGNOSIS — M109 Gout, unspecified: Secondary | ICD-10-CM | POA: Diagnosis present

## 2014-04-05 DIAGNOSIS — I129 Hypertensive chronic kidney disease with stage 1 through stage 4 chronic kidney disease, or unspecified chronic kidney disease: Secondary | ICD-10-CM | POA: Diagnosis present

## 2014-04-05 DIAGNOSIS — I480 Paroxysmal atrial fibrillation: Secondary | ICD-10-CM | POA: Diagnosis not present

## 2014-04-05 LAB — CBC
HCT: 30.4 % — ABNORMAL LOW (ref 39.0–52.0)
Hemoglobin: 9.7 g/dL — ABNORMAL LOW (ref 13.0–17.0)
MCH: 27 pg (ref 26.0–34.0)
MCHC: 31.9 g/dL (ref 30.0–36.0)
MCV: 84.7 fL (ref 78.0–100.0)
Platelets: 31 10*3/uL — ABNORMAL LOW (ref 150–400)
RBC: 3.59 MIL/uL — AB (ref 4.22–5.81)
RDW: 18.3 % — ABNORMAL HIGH (ref 11.5–15.5)
WBC: 1.8 10*3/uL — ABNORMAL LOW (ref 4.0–10.5)

## 2014-04-05 LAB — BASIC METABOLIC PANEL
Anion gap: 9 (ref 5–15)
BUN: 64 mg/dL — ABNORMAL HIGH (ref 6–23)
CO2: 22 mmol/L (ref 19–32)
Calcium: 8.9 mg/dL (ref 8.4–10.5)
Chloride: 110 mmol/L (ref 96–112)
Creatinine, Ser: 2.18 mg/dL — ABNORMAL HIGH (ref 0.50–1.35)
GFR calc Af Amer: 29 mL/min — ABNORMAL LOW (ref >90)
GFR calc non Af Amer: 25 mL/min — ABNORMAL LOW (ref >90)
Glucose, Bld: 108 mg/dL — ABNORMAL HIGH (ref 70–99)
POTASSIUM: 4.3 mmol/L (ref 3.5–5.1)
Sodium: 141 mmol/L (ref 135–145)

## 2014-04-05 LAB — PROTIME-INR
INR: 1.27 (ref 0.00–1.49)
PROTHROMBIN TIME: 16 s — AB (ref 11.6–15.2)

## 2014-04-05 MED ORDER — SODIUM CHLORIDE 0.9 % IV SOLN
INTRAVENOUS | Status: DC
  Administered 2014-04-05: 20:00:00 via INTRAVENOUS

## 2014-04-05 MED ORDER — PANTOPRAZOLE SODIUM 40 MG PO TBEC
40.0000 mg | DELAYED_RELEASE_TABLET | Freq: Every day | ORAL | Status: DC
  Administered 2014-04-06 – 2014-04-07 (×2): 40 mg via ORAL
  Filled 2014-04-05 (×2): qty 1

## 2014-04-05 MED ORDER — PROPRANOLOL HCL 10 MG PO TABS
10.0000 mg | ORAL_TABLET | Freq: Two times a day (BID) | ORAL | Status: DC
  Administered 2014-04-06: 10 mg via ORAL
  Filled 2014-04-05 (×5): qty 1

## 2014-04-05 MED ORDER — SODIUM CHLORIDE 0.9 % IJ SOLN
3.0000 mL | Freq: Two times a day (BID) | INTRAMUSCULAR | Status: DC
  Administered 2014-04-06: 3 mL via INTRAVENOUS

## 2014-04-05 MED ORDER — ALLOPURINOL 100 MG PO TABS
100.0000 mg | ORAL_TABLET | Freq: Every day | ORAL | Status: DC
Start: 2014-04-06 — End: 2014-04-07
  Administered 2014-04-06 – 2014-04-07 (×2): 100 mg via ORAL
  Filled 2014-04-05 (×2): qty 1

## 2014-04-05 MED ORDER — SODIUM CHLORIDE 0.9 % IV SOLN: 250.0000 mL | INTRAVENOUS | Status: DC | PRN

## 2014-04-05 MED ORDER — SERTRALINE HCL 50 MG PO TABS
50.0000 mg | ORAL_TABLET | Freq: Every day | ORAL | Status: DC
  Administered 2014-04-06 – 2014-04-07 (×2): 50 mg via ORAL
  Filled 2014-04-05 (×2): qty 1

## 2014-04-05 MED ORDER — LEVOTHYROXINE SODIUM 25 MCG PO TABS
25.0000 ug | ORAL_TABLET | Freq: Every day | ORAL | Status: DC
  Administered 2014-04-06 – 2014-04-07 (×2): 25 ug via ORAL
  Filled 2014-04-05 (×3): qty 1

## 2014-04-05 MED ORDER — CLONAZEPAM 0.5 MG PO TABS
0.5000 mg | ORAL_TABLET | Freq: Every day | ORAL | Status: DC | PRN
  Filled 2014-04-05: qty 1

## 2014-04-05 MED ORDER — AMIODARONE HCL 200 MG PO TABS
200.0000 mg | ORAL_TABLET | Freq: Every day | ORAL | Status: DC
  Administered 2014-04-06 – 2014-04-07 (×2): 200 mg via ORAL
  Filled 2014-04-05 (×3): qty 1

## 2014-04-05 MED ORDER — POTASSIUM CHLORIDE ER 10 MEQ PO TBCR
10.0000 meq | EXTENDED_RELEASE_TABLET | Freq: Every day | ORAL | Status: DC
  Administered 2014-04-06 – 2014-04-07 (×2): 10 meq via ORAL
  Filled 2014-04-05 (×2): qty 1

## 2014-04-05 MED ORDER — SODIUM CHLORIDE 0.9 % IV SOLN: Freq: Once | INTRAVENOUS | Status: AC

## 2014-04-05 MED ORDER — SENNOSIDES-DOCUSATE SODIUM 8.6-50 MG PO TABS
1.0000 | ORAL_TABLET | Freq: Every evening | ORAL | Status: DC | PRN
  Filled 2014-04-05: qty 1

## 2014-04-05 MED ORDER — AMLODIPINE BESYLATE 5 MG PO TABS
5.0000 mg | ORAL_TABLET | Freq: Every day | ORAL | Status: DC
  Administered 2014-04-06: 5 mg via ORAL
  Filled 2014-04-05 (×2): qty 1

## 2014-04-05 MED ORDER — SODIUM CHLORIDE 0.9 % IJ SOLN: 3.0000 mL | INTRAMUSCULAR | Status: DC | PRN

## 2014-04-05 MED ORDER — HEPARIN SODIUM (PORCINE) 5000 UNIT/ML IJ SOLN
5000.0000 [IU] | Freq: Three times a day (TID) | INTRAMUSCULAR | Status: DC
  Administered 2014-04-05 – 2014-04-06 (×2): 5000 [IU] via SUBCUTANEOUS
  Filled 2014-04-05 (×4): qty 1

## 2014-04-05 NOTE — H&P (Signed)
Walter Mocha, MD at 03/25/2014 12:02 AM     Status: Addendum       Expand All Collapse All      Cardiology Office Note   Date: 03/25/2014   ID: [name], DOB September 17, 1922, MRN [id number]  PCP: Walter Cobble, MD Cardiologist: Walter Mocha, MD   Chief Complaint  Patient presents with  . Aortic Stenosis    fatigue    History of Present Illness: [name] is a 81 y.o. male who presents for follow-up of diastolic heart failure and aortic valve disease. He was just seen last week and has been seen by [name] in the interim.   The patient is followed for atrial fibrillation, aortic stenosis status post bioprosthetic aortic valve replacement, bradycardia, diastolic heart failure, and coronary artery disease. The patient has not been anticoagulated because of pancytopenia with platelets less than 50,000.  He underwent bioprosthetic aortic valve replacement at [facility] in 2013. He was initially considered for the CoreValve Trial but was excluded because of platelet count less than 50,000. He ultimately underwent surgical aortic valve replacement via a right mini thoracotomy with a 25 mm Mosaic Ultra porcine prosthesis. I cannot find a record of cardiac catheterization but in review of notes he had no significant coronary artery disease.  He has had progression of fatigue and DOE. Echo shows progressive bioprosthetic valve dysfunction with aortic stenosis and insufficiency. He is scheduled for TEE later this week with [name].   The patient has lost about 5 pounds since his lasix was increased last week. Leg swelling improved. No change in DOE/fatigue. No other complaints and specifically denies fever, chills, or cough. He is here with his wife and daughter today.  Past Medical History  Diagnosis Date  . Stroke   . Atrial fibrillation     a. amiodarone therapy; not felt to be a candidate for anticoagulation - bruises  easily.  . Bradycardia     a. only tolerates metoprolol 12.5mg  daily (bradycardic w/ bid dosing).  . Aortic stenosis     a. Previously severe -> s/p minimally invasive tissue AVR with Walter. Evelina Walton at [facility] 01/2011 (pre-AVR cath with no obs CAD); b. 12/2012 TEE: EF 60-65%, no veg, mild AS, triv AI.  [name] Kitchen Hypertension   . Anemia   . Action tremor   . Thrombocytopenia     [name]  . Colon polyp   . Anxiety   . Splenomegaly   . Gout   . GERD (gastroesophageal reflux disease)   . Hypothyroidism   . Leukopenia     Chronic pancytopenia  . Degenerative disc disease   . Joint effusion, knee     left knee  . Synovial cyst of popliteal space   . Cellulitis of left leg 10/11-16/2012  . CKD (chronic kidney disease), stage III   . Chronic diastolic CHF (congestive heart failure)     a. 12/15/2012 TEE: EF 60-65%, no veg.  . Bacteremia     a. 12/2012 - S bovis; b. 12/2012 TEE w/o veg; c. Seeing ID->Rocephin therapy extended to 01/25/2013 via PICC for possible endocarditis (No veg on TEE).  . High cholesterol   . Heart murmur   . Diabetes mellitus     "borderline" (01/05/2013)  . History of blood transfusion 01/2011; 11/2012    "related to heart valve replaced; low HgB count" (01/05/2013)  . H/O hiatal hernia   . Throat cancer     s/p lasered  . Diverticulosis  Past Surgical History  Procedure Laterality Date  . Cholecystectomy    . Joint effusion      left knee  . Aortic valve replacement  01/23/2011    via minimally invasive approach per [name], [facility]  . Tee without cardioversion N/A 12/15/2012    Procedure: TRANSESOPHAGEAL ECHOCARDIOGRAM (TEE); Surgeon: Walter Spark, MD; Location: [facility] ENDOSCOPY; Service: Cardiovascular; Laterality: N/A;  . Cardiac valve replacement    . Inguinal hernia repair Right   . Excisional  hemorrhoidectomy    . Cardiac catheterization    . Surgery scrotal / testicular      "removed one" (01/05/2013)  . 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 allopurinol (ZYLOPRIM) 100 MG tablet Take 1 tablet (100 mg total) by mouth daily. 90 tablet 0  . amiodarone (PACERONE) 200 MG tablet Take 1 tablet (200 mg total) by mouth daily. 90 tablet 1  . amLODipine (NORVASC) 5 MG tablet Take 1 tablet (5 mg total) by mouth daily. 90 tablet 1  . aspirin 325 MG tablet Take 325 mg by mouth daily.     . Cholecalciferol (VITAMIN D) 1000 UNITS capsule Take 1 capsule (1,000 Units total) by mouth daily. 90 capsule 3  . clonazePAM (KLONOPIN) 0.5 MG tablet 1/2 as needed if you wake up early 30 tablet 1  . colchicine 0.6 MG tablet Take 0.6 mg by mouth daily as needed (gout flares). Take one tablet by mouth daily during an acute episode of gout.    . fish oil-omega-3 fatty acids 1000 MG capsule Take 1 g by mouth daily.     . Flaxseed, Linseed, (FLAX SEED OIL) 1000 MG CAPS Take 1 capsule by mouth daily.     . furosemide (LASIX) 80 MG tablet Take 1.5 tablets (120 mg total) by mouth 2 (two) times daily. 90 tablet 1  . levothyroxine (LEVOTHROID) 25 MCG tablet Take 1 tablet (25 mcg total) by mouth daily. 90 tablet 1  . Multiple Vitamin (MULTIVITAMIN) tablet Take 1 tablet by mouth daily.     . mupirocin ointment (BACTROBAN) 2 % Applied twice a day to the affected area 22 g 0  . omeprazole (PRILOSEC) 20 MG capsule Take 1 capsule (20 mg total) by mouth daily. 90 capsule 1  . potassium chloride (K-DUR) 10 MEQ tablet Take 1 tablet (10 mEq total) by mouth daily. 90 tablet 1  . propranolol (INDERAL) 10 MG  tablet Take 1 tablet (10 mg total) by mouth 2 (two) times daily. 180 tablet 1  . sertraline (ZOLOFT) 50 MG tablet Take 50 mg by mouth daily.    . vitamin C (ASCORBIC ACID) 500 MG tablet Take 1 tablet (500 mg total) by mouth daily. 90 tablet 3   No current facility-administered medications for this visit.    Allergies: Penicillins and Neomycin-bacitracin zn-polymyx   Social History: The patient  reports that he quit smoking about 52 years ago. His smoking use included Cigarettes. He has a 18 pack-year smoking history. He has never used smokeless tobacco. He reports that he does not drink alcohol or use illicit drugs.   Family History: [name] family history includes Cancer in his other; Colon cancer in an other family member; Stroke in his other and another family member.    ROS:  Please see the history of present illness. All other systems are reviewed and negative.   PHYSICAL EXAM: VS: BP 140/42 mmHg  Pulse 49  Ht 5' 10.5" (1.791 m)  Wt 188 lb (85.276 kg)  BMI 26.58 kg/m2 , BMI Body mass index is 26.58 kg/(m^2). GEN: Elderly male, in no acute distress  HEENT: normal  Neck: JVP mildly elevated Cardiac: brady and regular with systolic and diastolic murmurs at the RUSB  Respiratory: clear to auscultation bilaterally, normal work of breathing GI: soft, nontender, nondistended, + BS MS: no deformity or atrophy  Ext: 1+ pretibial edema Skin: warm and dry, no rash Neuro: Strength and sensation are intact Psych: euthymic mood, full affect  EKG: EKG is ordered today. The ekg ordered today shows Sinus brady 49 bpm, LVH otherwise WNL  Recent Labs: 02/22/2014: Hemoglobin 8.1*; Platelets 39* 03/11/2014: ALT 17; TSH 5.15* 03/23/2014: BUN 70*; Creatinine 2.34*; Potassium 4.2; Sodium 138   Lipid Panel   Labs (Brief)       Component Value Date/Time   CHOL 84 09/21/2013 0809   TRIG 94.0 09/21/2013 0809   HDL 25.60* 09/21/2013 0809    CHOLHDL 3 09/21/2013 0809   VLDL 18.8 09/21/2013 0809   LDLCALC 40 09/21/2013 0809       Wt Readings from Last 3 Encounters:  03/24/14 188 lb (85.276 kg)  03/18/14 185 lb (83.915 kg)  03/16/14 191 lb 12.8 oz (87 kg)    ASSESSMENT AND PLAN: 1. Acute on chronic diastolic heart failure, likely secondary to valvular heart disease. Appreciate Walter Vivi Martens evaluation. Plan for TEE later this week to evaluate whether AI is within the valve or paravalvular. Also noted to have severe AS, but suspect some relation to flow in setting of regurgitant volume. Labs show stable creatinine but increasing BUN. Probably best to reduce lasix back to 80 mg BID.  2. AKI on CKD IV: if we pursue TAVR evaluation renal function will be a major issue. He may reuqire hospitalization to optimize CHF/renal function before undergoing cardiac cath or CT. He has stopped NSAID's.   3. Pancytopenia: he will call Walter Benay Walton for consideration of erythropoieitin. Suspect fatigue is multifactorial.  Further plans pending TEE result. Likely will pursue further treatment of bioprosthetic aortic valve stenosis/insufficiency considering his clinical decline and progressive heart failure.   Current medicines are reviewed with the patient today. The patient does not have concerns regarding medicines.  The following changes have been made: Decrease furosemide to 80 mg BID  Labs/ tests ordered today include:  Orders Placed This Encounter  Procedures  . EKG 12-Lead    Signed, Walter Mocha, MD  03/25/2014 12:02 AM  [facility] [place], [place], [place] [address] Phone: [phone]; Fax: [phone]   Addendum 03/31/2014: Reviewed patient's TEE images. Review of Walter. Johnsie Walton. The patient has severe aortic insufficiency and it appears to be related to a perforated leaflet on his bioprosthesis. There is an eccentric jet present. Plan will be to proceed with  right and left heart catheterization. I recommended that the patient be admitted the day before his heart catheterization because of his kidney disease. We need to optimize his fluid status prior to the procedure. I spoke to the patient and his wife on the telephone and this will be arranged for next week. I will plan on admitting him Monday and arrange his heart catheterization with Walter. Angelena Form on Tuesday.  [name] 03/31/2014 6:20 PM  ADDENDUM 04/05/2014: Pt interviewed and examined.  There is no change in his exam. Wife and daughter at bedside. Have reviewed TEE further with Walter Meda Coffee who agrees AI is central (within the prosthetic valve). Will continue workup for valve-in-valve TAVR.  Multiple issues include CKD IV, pancytopenia with plt < 50K, and CHF. Will draw labs now, start gentle hydration tonight, hold lasix this pm and tomorrow am, and plan right/left heart cath tomorrow. May be best to use left radial site as he had some discomfort in the right arm last cath and was told he had a 'bend' in the artery. May need platelet transfusion precath but will wait and see plt count today.  [name] 04/05/2014 12:47 PM

## 2014-04-06 ENCOUNTER — Encounter (HOSPITAL_COMMUNITY)
Admission: AD | Disposition: A | Discharge status: Home / Self Care | Payer: Self-pay | Source: Ambulatory Visit | Attending: Cardiovascular Disease

## 2014-04-06 ENCOUNTER — Encounter (HOSPITAL_COMMUNITY): Payer: Self-pay | Admitting: *Deleted

## 2014-04-06 DIAGNOSIS — I350 Nonrheumatic aortic (valve) stenosis: Secondary | ICD-10-CM

## 2014-04-06 DIAGNOSIS — I2510 Atherosclerotic heart disease of native coronary artery without angina pectoris: Secondary | ICD-10-CM

## 2014-04-06 DIAGNOSIS — I480 Paroxysmal atrial fibrillation: Secondary | ICD-10-CM

## 2014-04-06 DIAGNOSIS — D61818 Other pancytopenia: Secondary | ICD-10-CM

## 2014-04-06 LAB — POCT I-STAT 3, VENOUS BLOOD GAS (G3P V)
ACID-BASE DEFICIT: 6 mmol/L — AB (ref 0.0–2.0)
BICARBONATE: 19.2 meq/L — AB (ref 20.0–24.0)
O2 Saturation: 64 %
TCO2: 20 mmol/L (ref 0–100)
pCO2, Ven: 33.8 mmHg — ABNORMAL LOW (ref 45.0–50.0)
pH, Ven: 7.362 — ABNORMAL HIGH (ref 7.250–7.300)
pO2, Ven: 34 mmHg (ref 30.0–45.0)

## 2014-04-06 LAB — BASIC METABOLIC PANEL
Anion gap: 4 — ABNORMAL LOW (ref 5–15)
BUN: 65 mg/dL — ABNORMAL HIGH (ref 6–23)
CHLORIDE: 112 mmol/L (ref 96–112)
CO2: 25 mmol/L (ref 19–32)
Calcium: 8.9 mg/dL (ref 8.4–10.5)
Creatinine, Ser: 2.28 mg/dL — ABNORMAL HIGH (ref 0.50–1.35)
GFR calc Af Amer: 27 mL/min — ABNORMAL LOW (ref >90)
GFR, EST NON AFRICAN AMERICAN: 24 mL/min — AB (ref >90)
Glucose, Bld: 138 mg/dL — ABNORMAL HIGH (ref 70–99)
Potassium: 4.6 mmol/L (ref 3.5–5.1)
Sodium: 141 mmol/L (ref 135–145)

## 2014-04-06 LAB — POCT I-STAT 3, ART BLOOD GAS (G3+)
Acid-base deficit: 6 mmol/L — ABNORMAL HIGH (ref 0.0–2.0)
BICARBONATE: 18.5 meq/L — AB (ref 20.0–24.0)
O2 Saturation: 96 %
PH ART: 7.376 (ref 7.350–7.450)
TCO2: 19 mmol/L (ref 0–100)
pCO2 arterial: 31.6 mmHg — ABNORMAL LOW (ref 35.0–45.0)
pO2, Arterial: 81 mmHg (ref 80.0–100.0)

## 2014-04-06 LAB — CBC
HEMATOCRIT: 26.8 % — AB (ref 39.0–52.0)
HEMOGLOBIN: 8.5 g/dL — AB (ref 13.0–17.0)
MCH: 26.9 pg (ref 26.0–34.0)
MCHC: 31.7 g/dL (ref 30.0–36.0)
MCV: 84.8 fL (ref 78.0–100.0)
Platelets: 25 10*3/uL — CL (ref 150–400)
RBC: 3.16 MIL/uL — AB (ref 4.22–5.81)
RDW: 18.4 % — ABNORMAL HIGH (ref 11.5–15.5)
WBC: 1.2 10*3/uL — AB (ref 4.0–10.5)

## 2014-04-06 SURGERY — RIGHT HEART CATH AND CORONARY ANGIOGRAPHY

## 2014-04-06 MED ORDER — VERAPAMIL HCL 2.5 MG/ML IV SOLN
INTRAVENOUS | Status: AC
  Filled 2014-04-06: qty 2

## 2014-04-06 MED ORDER — HEPARIN SODIUM (PORCINE) 1000 UNIT/ML IJ SOLN
INTRAMUSCULAR | Status: AC
  Filled 2014-04-06: qty 1

## 2014-04-06 MED ORDER — HEPARIN (PORCINE) IN NACL 2-0.9 UNIT/ML-% IJ SOLN
INTRAMUSCULAR | Status: AC
  Filled 2014-04-06: qty 1000

## 2014-04-06 MED ORDER — FENTANYL CITRATE 0.05 MG/ML IJ SOLN
INTRAMUSCULAR | Status: AC
  Filled 2014-04-06: qty 2

## 2014-04-06 MED ORDER — SODIUM CHLORIDE 0.9 % IV SOLN: INTRAVENOUS | Status: AC

## 2014-04-06 MED ORDER — LIDOCAINE HCL (PF) 1 % IJ SOLN
INTRAMUSCULAR | Status: AC
  Filled 2014-04-06: qty 30

## 2014-04-06 MED ORDER — MIDAZOLAM HCL 2 MG/2ML IJ SOLN
INTRAMUSCULAR | Status: AC
  Filled 2014-04-06: qty 2

## 2014-04-06 MED ORDER — NITROGLYCERIN 1 MG/10 ML FOR IR/CATH LAB
INTRA_ARTERIAL | Status: AC
  Filled 2014-04-06: qty 10

## 2014-04-06 NOTE — Progress Notes (Signed)
Utilization review completed.  

## 2014-04-06 NOTE — Progress Notes (Signed)
Pt arrived from Cath lab at 1640; VSS per protocol; A&O x4; family at beside; Right groin level 0; clean, dry dsg intact with no s/s bleeding, hematoma or stain noted; Left TR band has 11 cc air per bedside receiving report; pt band intact with small oozing noted and informed by cath lab RN to deflate band in an hour time at 1730; pt educated on keeping RLE flat in bed and will remain on bedrest till 2010 per report; pt also educated on keeping LUE elevated and still on pillow. Pt and family at bedside all voices understanding and denies any question. Telemetry reapplied and verified. Pt fluid intact and infusing. Pt in bed with call light within reach and family at bedside. Will continue to monitor quietly as ordered and per protocol. Delia Heady RN

## 2014-04-06 NOTE — Progress Notes (Signed)
Rodney from cath lab called and informed off pt oozing TR band; he came in to assess and said to try again per protocol and monitor. 3cc of air removed from TR band started oozing and 3cc injected back along with incoming RN. Reported off to incoming RN. Pt also had loose bm and pt stated it was his third loose bm. C-diff sample sent per protocol. Pt in bed comfortably with call light and family at bedside. Francis Gaines Bowen Goyal RN.

## 2014-04-06 NOTE — Progress Notes (Signed)
Site area: right  Groin a 7 french venous sheath was removed  Site Prior to Removal:  Level 0  Pressure Applied For 15 MINUTES    Minutes Beginning at 1555p  Manual:   Yes.    Patient Status During Pull:  stable  Post Pull Groin Site:  Level 0  Post Pull Instructions Given:  Yes.    Post Pull Pulses Present:  Yes.    Dressing Applied:  Yes.    Comments:  VS remain stable during sheath pull.  Pt denies any discomfort at site at this timet

## 2014-04-06 NOTE — Progress Notes (Signed)
CRITICAL VALUE ALERT  Critical value received: WBC 1.2; Platelets 25  Date of notification: 04/06/14  Time of notification:  0553  Critical value read back:Yes  Nurse who received alert:  Arnell Sieving   MD notified (1st page):  Tommi Rumps  Time of first page:  0558  MD notified (2nd page):  Time of second page:  Responding MD:  Tommi Rumps  Time MD responded:  [phone]

## 2014-04-06 NOTE — H&P (View-Only) (Signed)
Patient Profile: Patient is followed for atrial fibrillation, aortic stenosis status post bioprosthetic aortic valve replacement, bradycardia, diastolic heart failure, and coronary artery disease. The patient has not been anticoagulated because of pancytopenia with platelets less than 50,000.  He underwent bioprosthetic aortic valve replacement at [facility] in 2013. H recently developed progression of fatigue and DOE. TEE demonstrated severe AI, secondary to a perforated leaflet on his bioprosthesis. Now referred for [facility].    Subjective: No symptoms at rest. Denies CP.   Objective: Vital signs in last 24 hours: Temp:  [97.8 F (36.6 C)-98.4 F (36.9 C)] 98.4 F (36.9 C) (03/29 0432) Pulse Rate:  [48-52] 51 (03/29 0432) Resp:  [18] 18 (03/29 0432) BP: (142-160)/(32-40) 148/38 mmHg (03/29 0432) SpO2:  [97 %-100 %] 97 % (03/29 0432) Last BM Date: 04/05/14  Intake/Output from previous day: 03/28 0701 - 03/29 0700 In: 480 [P.O.:480] Out: 200 [Urine:200] Intake/Output this shift:    Medications Current Facility-Administered Medications  Medication Dose Route Frequency Provider Last Rate Last Dose  . 0.9 %  sodium chloride infusion  250 mL Intravenous PRN [name], MD      . 0.9 %  sodium chloride infusion   Intravenous Continuous [name], MD 50 mL/hr at 04/05/14 2001    . allopurinol (ZYLOPRIM) tablet 100 mg  100 mg Oral Daily [name], MD      . amiodarone (PACERONE) tablet 200 mg  200 mg Oral Daily [name], MD   200 mg at 04/05/14 1456  . amLODipine (NORVASC) tablet 5 mg  5 mg Oral Daily [name], MD      . clonazePAM [facility]) tablet 0.5 mg  0.5 mg Oral Daily PRN [name], MD      . heparin injection 5,000 Units  5,000 Units Subcutaneous 3 times per day [name], MD   5,000 Units at 04/06/14 0001  . levothyroxine (SYNTHROID, LEVOTHROID) tablet 25 mcg  25 mcg Oral QAC breakfast [name], MD   25 mcg at  04/06/14 [phone]  . pantoprazole (PROTONIX) EC tablet 40 mg  40 mg Oral Daily [name], MD      . potassium chloride (K-DUR) CR tablet 10 mEq  10 mEq Oral Daily [name], MD      . propranolol (INDERAL) tablet 10 mg  10 mg Oral BID [name], MD   0 mg at 04/05/14 2200  . senna-docusate (Senokot-S) tablet 1 tablet  1 tablet Oral QHS PRN [name], MD      . sertraline (ZOLOFT) tablet 50 mg  50 mg Oral Daily [name], MD      . sodium chloride 0.9 % injection 3 mL  3 mL Intravenous Q12H [name], MD   3 mL at 04/05/14 1543  . sodium chloride 0.9 % injection 3 mL  3 mL Intravenous PRN [name], MD      . sodium chloride 0.9 % injection 3 mL  3 mL Intravenous Q12H [name], MD   3 mL at 04/05/14 1543    PE: General appearance: alert, cooperative and no distress Neck: no carotid bruit and no JVD Lungs: clear to auscultation bilaterally Heart: regular rate and rhythm and systolic + distaolic murmur Extremities: 1+ bilateral LEE Pulses: 2+ and symmetric Skin: warm and dry Neurologic: Grossly normal  Lab Results:   Recent Labs  04/05/14 1417 04/06/14 0428  WBC 1.8* 1.2*  HGB 9.7* 8.5*  HCT 30.4* 26.8*  PLT 31* 25*   BMET  Recent Labs  04/05/14 1417 04/06/14 0428  NA 141 141  K 4.3 4.6  CL 110 112  CO2 22 25  GLUCOSE 108* 138*  BUN 64* 65*  CREATININE 2.18* 2.28*  CALCIUM 8.9 8.9   PT/INR  Recent Labs  04/05/14 1417  LABPROT 16.0*  INR 1.27     Assessment/Plan  Active Problems:   Severe aortic insufficiency   1. Severe Aortic Insufficiency: demonstrated on TEE and likely the cause of his fatigue and DOE. Currently undergoing work-up for valve-in-valve TAVR. R/LHC today.   2. CKD: received pre-cath IV hydration. However, SCr is actually increased since yesterday 2.18-->2.28. Lasix is on hold. Repeat BMP in the am.   3. Chronic Diastolic HF: with mild 1+ bilateral LEE. Lasix on hold for cath. Continue BB.   4.  Paroxsymal Atrial Fibrillation: in sinus brady. HR in the 50s. Not on anticoagulation due to chronic pancytopenia.   5. Pancytopenia: chronic. 25 K/ uL today. He is scheduled to receive platelets prior to cath. Recommend radial access to lower bleed risk. Will d/c SQ heparin and use SCDs for DVT prophylaxis.     LOS: 1 day    Brittainy M. Ladoris Gene 04/06/2014 9:39 AM  Patient seen and examined and history reviewed. Agree with above findings and plan. Patient is comfortable. Awaiting cath today. Questions answered. Will reassess renal function post contrast. For plt. Transfusion today.   Squire Withey [place], [place] 04/06/2014 12:04 PM

## 2014-04-06 NOTE — CV Procedure (Signed)
Cardiac Catheterization Operative Report  [name] [id number] 3/29/20162:42 PM [name], MD  Procedure Performed:  1. Selective Coronary Angiography 2. Right Heart Catheterization  Operator: Lauree Chandler, MD  Indication:   81 yo male with history of severe aortic valve stenosis/insufficiency secondary to malfunction bioprosthetic AVR, thrombocytopenia, chronic diastolic CHF here today for cardiac cath. Recent fatigue and dyspnea leading to echocardiogram which showed severe AS and moderate AI of the bioprosthetic aortic valve. This was confirmed by TEE which showed severe AI felt to be due to a perforated leaflet. Workup is currently underway for TAVR.                        Procedure Details: The risks, benefits, complications, treatment options, and expected outcomes were discussed with the patient. The patient and/or family concurred with the proposed plan, giving informed consent. The patient was brought to the cath lab after IV hydration was begun and oral premedication was given. The patient was further sedated with Versed. We explored the option of left antecubital vein access for the right heart cath but he had no good venous access points and his arm was bruised from prior sticks on the floor. I elected to place a sheath in the right femoral artery for the right heart cath and the left radial artery for the coronary angiography. The right groin was prepped and draped in the usual manner. Using the modified Seldinger access technique, a 7 French sheath was placed in the right femoral vein. A balloon tipped catheter was used to perform a right heart catheterization. The left wrist was prepped and draped in a sterile fashion. 1% lidocaine was used for local anesthesia. A 5/6 French slender sheath was placed in the left radial artery using the modified Seldinger technique. Standard diagnostic catheters were used to perform selective coronary angiography. The  bioprosthetic aortic valve was not crossed. There were no immediate complications. Terumo hemostasis band applied to the left wrist. The patient was taken to the recovery area in stable condition.   Hemodynamic Findings: Ao: 146/30              LV: Aortic valve crossed RA: 5               RV: 42/3/9 PA: 42/13 (mean 25)        PCWP: 18 Fick Cardiac Output: 7.19 L/min Fick Cardiac Index: 3.6 L/min/m2 Central Aortic Saturation: 96% Pulmonary Artery Saturation: 64%  Angiographic Findings:  Left main: No obstructive disease.   Left Anterior Descending Artery: Large caliber vessel that courses to the apex. The proximal vessel is heavily calcified with diffuse 20% stenosis. The mid vessel is calcified with diffuse 20% stenosis. The distal vessel has mild plaque disease. There is a moderate caliber diagonal branch with no obstructive disease.   Circumflex Artery: Large caliber vessel with moderate caliber first obtuse marginal branch and large caliber second obtuse marginal branch. No obstructive disease. There is calcification noted in throughout the obtuse marginal branches and the AV groove Circumflex.   Right Coronary Artery: Large dominant vessel with calcification noted in the mid vessel. The proximal vessel has 20% stenosis. The mid vessel has a focal calcified 40% stenosis. The distal vessel has diffuse 30% stenosis.   Left Ventricular Angiogram: Deferred.   Impression: 1. Mild non-obstructive CAD 2. Mild elevation filling pressures 3. Known failure of bioprosthetic aortic valve with severe AI/AS.   Recommendations: Will gently hydrate tonight and  recheck BMET in am. If he is stable tomorrow, may be able to go home. Further planning for TAVR as an outpatient.        Complications:  None; patient tolerated the procedure well.

## 2014-04-06 NOTE — Progress Notes (Signed)
VSS; removed 2cc or air no oozing or bleeding noted. Will continue to monitor quietly. Delia Heady RN

## 2014-04-06 NOTE — Interval H&P Note (Signed)
History and Physical Interval Note:  04/06/2014 2:40 PM  [name]  has presented today for cardiac cath with the diagnosis of diastolic heart failure, severe aortic stenosis.  The various methods of treatment have been discussed with the patient and family. After consideration of risks, benefits and other options for treatment, the patient has consented to  Procedure(s): LEFT AND RIGHT HEART CATHETERIZATION WITH CORONARY ANGIOGRAM (N/A) as a surgical intervention .  [name] history has been reviewed, patient examined, no change in status, stable for surgery.  I have reviewed the patient's chart and labs.  Questions were answered to the patient's satisfaction.    No PCI will be performed so AUC criteria not included.    MCALHANY,CHRISTOPHER

## 2014-04-06 NOTE — Progress Notes (Signed)
Patient Profile: Patient is followed for atrial fibrillation, aortic stenosis status post bioprosthetic aortic valve replacement, bradycardia, diastolic heart failure, and coronary artery disease. The patient has not been anticoagulated because of pancytopenia with platelets less than 50,000.  He underwent bioprosthetic aortic valve replacement at [facility] in 2013. H recently developed progression of fatigue and DOE. TEE demonstrated severe AI, secondary to a perforated leaflet on his bioprosthesis. Now referred for [facility].    Subjective: No symptoms at rest. Denies CP.   Objective: Vital signs in last 24 hours: Temp:  [97.8 F (36.6 C)-98.4 F (36.9 C)] 98.4 F (36.9 C) (03/29 0432) Pulse Rate:  [48-52] 51 (03/29 0432) Resp:  [18] 18 (03/29 0432) BP: (142-160)/(32-40) 148/38 mmHg (03/29 0432) SpO2:  [97 %-100 %] 97 % (03/29 0432) Last BM Date: 04/05/14  Intake/Output from previous day: 03/28 0701 - 03/29 0700 In: 480 [P.O.:480] Out: 200 [Urine:200] Intake/Output this shift:    Medications Current Facility-Administered Medications  Medication Dose Route Frequency Provider Last Rate Last Dose  . 0.9 %  sodium chloride infusion  250 mL Intravenous PRN [name], MD      . 0.9 %  sodium chloride infusion   Intravenous Continuous [name], MD 50 mL/hr at 04/05/14 2001    . allopurinol (ZYLOPRIM) tablet 100 mg  100 mg Oral Daily [name], MD      . amiodarone (PACERONE) tablet 200 mg  200 mg Oral Daily [name], MD   200 mg at 04/05/14 1456  . amLODipine (NORVASC) tablet 5 mg  5 mg Oral Daily [name], MD      . clonazePAM [facility]) tablet 0.5 mg  0.5 mg Oral Daily PRN [name], MD      . heparin injection 5,000 Units  5,000 Units Subcutaneous 3 times per day [name], MD   5,000 Units at 04/06/14 0001  . levothyroxine (SYNTHROID, LEVOTHROID) tablet 25 mcg  25 mcg Oral QAC breakfast [name], MD   25 mcg at  04/06/14 [id number]  . pantoprazole (PROTONIX) EC tablet 40 mg  40 mg Oral Daily [name], MD      . potassium chloride (K-DUR) CR tablet 10 mEq  10 mEq Oral Daily [name], MD      . propranolol (INDERAL) tablet 10 mg  10 mg Oral BID [name], MD   0 mg at 04/05/14 2200  . senna-docusate (Senokot-S) tablet 1 tablet  1 tablet Oral QHS PRN [name], MD      . sertraline (ZOLOFT) tablet 50 mg  50 mg Oral Daily [name], MD      . sodium chloride 0.9 % injection 3 mL  3 mL Intravenous Q12H [name], MD   3 mL at 04/05/14 1543  . sodium chloride 0.9 % injection 3 mL  3 mL Intravenous PRN [name], MD      . sodium chloride 0.9 % injection 3 mL  3 mL Intravenous Q12H [name], MD   3 mL at 04/05/14 1543    PE: General appearance: alert, cooperative and no distress Neck: no carotid bruit and no JVD Lungs: clear to auscultation bilaterally Heart: regular rate and rhythm and systolic + distaolic murmur Extremities: 1+ bilateral LEE Pulses: 2+ and symmetric Skin: warm and dry Neurologic: Grossly normal  Lab Results:   Recent Labs  04/05/14 1417 04/06/14 0428  WBC 1.8* 1.2*  HGB 9.7* 8.5*  HCT 30.4* 26.8*  PLT 31* 25*   BMET  Recent Labs  04/05/14 1417 04/06/14 0428  NA 141 141  K 4.3 4.6  CL 110 112  CO2 22 25  GLUCOSE 108* 138*  BUN 64* 65*  CREATININE 2.18* 2.28*  CALCIUM 8.9 8.9   PT/INR  Recent Labs  04/05/14 1417  LABPROT 16.0*  INR 1.27     Assessment/Plan  Active Problems:   Severe aortic insufficiency   1. Severe Aortic Insufficiency: demonstrated on TEE and likely the cause of his fatigue and DOE. Currently undergoing work-up for valve-in-valve TAVR. R/LHC today.   2. CKD: received pre-cath IV hydration. However, SCr is actually increased since yesterday 2.18-->2.28. Lasix is on hold. Repeat BMP in the am.   3. Chronic Diastolic HF: with mild 1+ bilateral LEE. Lasix on hold for cath. Continue BB.   4.  Paroxsymal Atrial Fibrillation: in sinus brady. HR in the 50s. Not on anticoagulation due to chronic pancytopenia.   5. Pancytopenia: chronic. 25 K/ uL today. He is scheduled to receive platelets prior to cath. Recommend radial access to lower bleed risk. Will d/c SQ heparin and use SCDs for DVT prophylaxis.     LOS: 1 day    Brittainy M. Ladoris Gene 04/06/2014 9:39 AM  Patient seen and examined and history reviewed. Agree with above findings and plan. Patient is comfortable. Awaiting cath today. Questions answered. Will reassess renal function post contrast. For plt. Transfusion today.   Brinn Westby [place], [place] 04/06/2014 12:04 PM

## 2014-04-06 NOTE — Progress Notes (Signed)
Report given to [employer].

## 2014-04-07 ENCOUNTER — Other Ambulatory Visit: Payer: Self-pay | Admitting: *Deleted

## 2014-04-07 ENCOUNTER — Telehealth: Payer: Self-pay | Admitting: *Deleted

## 2014-04-07 DIAGNOSIS — I350 Nonrheumatic aortic (valve) stenosis: Secondary | ICD-10-CM

## 2014-04-07 DIAGNOSIS — N289 Disorder of kidney and ureter, unspecified: Secondary | ICD-10-CM

## 2014-04-07 LAB — CBC WITH DIFFERENTIAL/PLATELET
BASOS PCT: 0 % (ref 0–1)
Basophils Absolute: 0 10*3/uL (ref 0.0–0.1)
EOS ABS: 0 10*3/uL (ref 0.0–0.7)
EOS PCT: 1 % (ref 0–5)
HEMATOCRIT: 30.6 % — AB (ref 39.0–52.0)
HEMOGLOBIN: 9.6 g/dL — AB (ref 13.0–17.0)
Lymphocytes Relative: 14 % (ref 12–46)
Lymphs Abs: 0.3 10*3/uL — ABNORMAL LOW (ref 0.7–4.0)
MCH: 27 pg (ref 26.0–34.0)
MCHC: 31.4 g/dL (ref 30.0–36.0)
MCV: 86 fL (ref 78.0–100.0)
Monocytes Absolute: 0.1 10*3/uL (ref 0.1–1.0)
Monocytes Relative: 7 % (ref 3–12)
NEUTROS ABS: 1.5 10*3/uL — AB (ref 1.7–7.7)
Neutrophils Relative %: 78 % — ABNORMAL HIGH (ref 43–77)
Platelets: 38 10*3/uL — ABNORMAL LOW (ref 150–400)
RBC: 3.56 MIL/uL — ABNORMAL LOW (ref 4.22–5.81)
RDW: 18.7 % — AB (ref 11.5–15.5)
WBC: 1.9 10*3/uL — AB (ref 4.0–10.5)

## 2014-04-07 LAB — BASIC METABOLIC PANEL
ANION GAP: 5 (ref 5–15)
BUN: 50 mg/dL — AB (ref 6–23)
CO2: 23 mmol/L (ref 19–32)
Calcium: 9.2 mg/dL (ref 8.4–10.5)
Chloride: 115 mmol/L — ABNORMAL HIGH (ref 96–112)
Creatinine, Ser: 2.13 mg/dL — ABNORMAL HIGH (ref 0.50–1.35)
GFR calc Af Amer: 30 mL/min — ABNORMAL LOW (ref >90)
GFR calc non Af Amer: 26 mL/min — ABNORMAL LOW (ref >90)
GLUCOSE: 220 mg/dL — AB (ref 70–99)
POTASSIUM: 4.4 mmol/L (ref 3.5–5.1)
SODIUM: 143 mmol/L (ref 135–145)

## 2014-04-07 LAB — PREPARE PLATELET PHERESIS
UNIT DIVISION: 0
Unit division: 0

## 2014-04-07 LAB — CLOSTRIDIUM DIFFICILE BY PCR: Toxigenic C. Difficile by PCR: NEGATIVE

## 2014-04-07 MED ORDER — SODIUM BICARBONATE 8.4 % IV SOLN: INTRAVENOUS | Status: AC

## 2014-04-07 MED ORDER — SODIUM BICARBONATE BOLUS VIA INFUSION: INTRAVENOUS | Status: AC

## 2014-04-07 NOTE — Discharge Summary (Signed)
Physician Discharge Summary  Patient ID: [name] MRN: [id number] DOB/AGE: 05-21-1922 81 y.o.   Primary Cardiologist: Dr. Burt Knack  Admit date: 04/05/2014 Discharge date: 04/07/2014  Admission Diagnoses: Severe Aortic Insufficiency   Discharge Diagnoses:  Active Problems:   Severe aortic insufficiency   Discharged Condition: stable  Hospital Course:  The patient is a 81 y/o male followed by Dr. Burt Knack. He has been followed for atrial fibrillation, aortic stenosis status post bioprosthetic aortic valve replacement, bradycardia, diastolic heart failure, and mild coronary artery disease. The patient has not been anticoagulated because of pancytopenia with platelets less than 50,000.  He underwent bioprosthetic aortic valve replacement at [facility] in 2013. He was initially considered for the CoreValve Trial but was excluded because of platelet count less than 50,000. He ultimately underwent surgical aortic valve replacement via a right mini thoracotomy with a 25 mm Mosaic Ultra porcine prosthesis.   He was evaluated recently in clinic and complained of progressive fatigue and DOE. 2D echo was ordered demonstrating progressive bioprosthetic valve dysfunction with aortic stenosis and insufficiency. Subsequent TEE showed severe AI, secondary to a perforated leaflet on his bioprosthesis. He is now undergoing w/u for valve-in-valve TAVR. It was recommended that he undergo a R/LHC. Due to chronic renal insufficiency, he was admitted the day prior to [facility] for pre-cath hydration.   Scr on arrival was 2.18. His Lasix was held. He received IV hydration. Scr morning of cath had actually increased slightly to 2.28. He was also noted to have a drop in platelets from 31 K/uL to 25 K/uL. He received platelet transfusion prior to undergoing his cath. The procedure was performed by Dr. Angelena Form. Access was obtained via both the left radial artery and the right femoral artery. He was found  to have mild-nonobstructive CAD with mildly elevated filling pressures (see full cath details below). He tolerated the procedure well and left the cath lab in stable condition. He had no post cath complications. There were no bleeding complications and his access sites remained stable. Follow-up CBC demonstrated improvement in platelet count to 38 K/ uL. BMP showed stable renal function back to baseline with SCr at 2.13.  He was last seen and examined by Dr. [place], who determined he was stable for discharge home. He will follow up with Dr. Burt Knack for additional TAVR w/u.   Consults: None  Significant Diagnostic Studies:  [facility] 04/06/14 Hemodynamic Findings: Ao: 146/30  LV: Aortic valve crossed RA: 5  RV: 42/3/9 PA: 42/13 (mean 25)  PCWP: 18 Fick Cardiac Output: 7.19 L/min Fick Cardiac Index: 3.6 L/min/m2 Central Aortic Saturation: 96% Pulmonary Artery Saturation: 64%  Angiographic Findings:  Left main: No obstructive disease.   Left Anterior Descending Artery: Large caliber vessel that courses to the apex. The proximal vessel is heavily calcified with diffuse 20% stenosis. The mid vessel is calcified with diffuse 20% stenosis. The distal vessel has mild plaque disease. There is a moderate caliber diagonal branch with no obstructive disease.   Circumflex Artery: Large caliber vessel with moderate caliber first obtuse marginal branch and large caliber second obtuse marginal branch. No obstructive disease. There is calcification noted in throughout the obtuse marginal branches and the AV groove Circumflex.   Right Coronary Artery: Large dominant vessel with calcification noted in the mid vessel. The proximal vessel has 20% stenosis. The mid vessel has a focal calcified 40% stenosis. The distal vessel has diffuse 30% stenosis.   Left Ventricular Angiogram: Deferred.   Impression: 1. Mild non-obstructive CAD  2. Mild elevation filling pressures 3. Known  failure of bioprosthetic aortic valve with severe AI/AS.     Treatments: See Hospital Course  Discharge Exam: Blood pressure 151/39, pulse 51, temperature 98.3 F (36.8 C), temperature source Oral, resp. rate 16, height 5\' 10"  (1.778 m), weight 185 lb 13.6 oz (84.3 kg), SpO2 100 %.   Disposition: 81-Discharged to home/self-care with a planned acute care hospital inpt readmission      Discharge Instructions    Diet - low sodium heart healthy    Complete by:  As directed      Increase activity slowly    Complete by:  As directed             Medication List    TAKE these medications        allopurinol 100 MG tablet  Commonly known as:  ZYLOPRIM  Take 1 tablet (100 mg total) by mouth daily.     amiodarone 200 MG tablet  Commonly known as:  PACERONE  Take 1 tablet (200 mg total) by mouth daily.     amLODipine 5 MG tablet  Commonly known as:  NORVASC  Take 1 tablet (5 mg total) by mouth daily.     aspirin 325 MG tablet  Take 325 mg by mouth daily.     clonazePAM 0.5 MG tablet  Commonly known as:  KLONOPIN  1/2 as needed if you wake up early     colchicine 0.6 MG tablet  Take 0.6 mg by mouth daily as needed (gout flares). Take one tablet by mouth daily during an acute episode of gout.     fish oil-omega-3 fatty acids 1000 MG capsule  Take 1 g by mouth daily.     Flax Seed Oil 1000 MG Caps  Take 1 capsule by mouth daily.     furosemide 80 MG tablet  Commonly known as:  LASIX  Take 1 tablet (80 mg total) by mouth 2 (two) times daily.     levothyroxine 25 MCG tablet  Commonly known as:  LEVOTHROID  Take 1 tablet (25 mcg total) by mouth daily.     multivitamin tablet  Take 1 tablet by mouth daily.     mupirocin ointment 2 %  Commonly known as:  BACTROBAN  Applied twice a day to the affected area     omeprazole 20 MG capsule  Commonly known as:  PRILOSEC  Take 1 capsule (20 mg total) by mouth daily.     potassium chloride 10 MEQ tablet  Commonly known  as:  K-DUR  Take 1 tablet (10 mEq total) by mouth daily.     propranolol 10 MG tablet  Commonly known as:  INDERAL  Take 1 tablet (10 mg total) by mouth 2 (two) times daily.     sertraline 50 MG tablet  Commonly known as:  ZOLOFT  Take 50 mg by mouth daily.     vitamin C 500 MG tablet  Commonly known as:  ASCORBIC ACID  Take 1 tablet (500 mg total) by mouth daily.     Vitamin D 1000 UNITS capsule  Take 1 capsule (1,000 Units total) by mouth daily.         TIME SPENT ON DISCHARGE, INCLUDING PHYSICIAN TIME: > 30 MINUTES  Signed: Ahmaud Duthie 04/07/2014, 12:01 PM

## 2014-04-07 NOTE — Telephone Encounter (Signed)
Pt was on TCM list was d/c 04/06/14- had Severe aortic Insufficient will f/u with cardiologist Dr. Burt Knack....[name]

## 2014-04-07 NOTE — Progress Notes (Signed)
Patient Profile: Patient is followed for atrial fibrillation, aortic stenosis status post bioprosthetic aortic valve replacement, bradycardia, diastolic heart failure, and coronary artery disease. The patient has not been anticoagulated because of pancytopenia with platelets less than 50,000.  He underwent bioprosthetic aortic valve replacement at [facility] in 2013. H recently developed progression of fatigue and DOE. TEE demonstrated severe AI, secondary to a perforated leaflet on his bioprosthesis. Now referred for [facility].    Subjective: No symptoms at rest. Denies CP. No pain at groin or left radial access site.   Objective: Vital signs in last 24 hours: Temp:  [97.8 F (36.6 C)-98.5 F (36.9 C)] 98.3 F (36.8 C) (03/30 0429) Pulse Rate:  [47-53] 51 (03/30 0429) Resp:  [11-18] 16 (03/30 0429) BP: (130-186)/(21-49) 155/37 mmHg (03/30 0429) SpO2:  [96 %-100 %] 100 % (03/30 0429) Weight:  [185 lb 13.6 oz (84.3 kg)] 185 lb 13.6 oz (84.3 kg) (03/29 1300) Last BM Date: 04/06/14  Intake/Output from previous day: 03/29 0701 - 03/30 0700 In: 950 [P.O.:360; I.V.:400; Blood:190] Out: 425 [Urine:425] Intake/Output this shift:    Medications Current Facility-Administered Medications  Medication Dose Route Frequency Provider Last Rate Last Dose  . 0.9 %  sodium chloride infusion   Intravenous Continuous Burnell Blanks, MD      . allopurinol (ZYLOPRIM) tablet 100 mg  100 mg Oral Daily [name], MD   100 mg at 04/06/14 1135  . amiodarone (PACERONE) tablet 200 mg  200 mg Oral Daily [name], MD   200 mg at 04/06/14 1134  . amLODipine (NORVASC) tablet 5 mg  5 mg Oral Daily [name], MD   5 mg at 04/06/14 1134  . clonazePAM (KLONOPIN) tablet 0.5 mg  0.5 mg Oral Daily PRN [name], MD      . levothyroxine (SYNTHROID, LEVOTHROID) tablet 25 mcg  25 mcg Oral QAC breakfast [name], MD   25 mcg at 04/07/14 0504  . pantoprazole (PROTONIX) EC  tablet 40 mg  40 mg Oral Daily [name], MD   40 mg at 04/06/14 1135  . potassium chloride (K-DUR) CR tablet 10 mEq  10 mEq Oral Daily [name], MD   10 mEq at 04/06/14 1134  . propranolol (INDERAL) tablet 10 mg  10 mg Oral BID [name], MD   10 mg at 04/06/14 1134  . senna-docusate (Senokot-S) tablet 1 tablet  1 tablet Oral QHS PRN [name], MD      . sertraline (ZOLOFT) tablet 50 mg  50 mg Oral Daily [name], MD   50 mg at 04/06/14 1135  . sodium chloride 0.9 % injection 3 mL  3 mL Intravenous Q12H [name], MD   3 mL at 04/06/14 1135    PE: General appearance: alert, cooperative and no distress Neck: no carotid bruit and no JVD Lungs: clear to auscultation bilaterally Heart: regular rate and rhythm and systolic + distaolic murmur Extremities: 1+ bilateral LEE Pulses: 2+ and symmetric Skin: warm and dry Neurologic: Grossly normal  Lab Results:   Recent Labs  04/05/14 1417 04/06/14 0428  WBC 1.8* 1.2*  HGB 9.7* 8.5*  HCT 30.4* 26.8*  PLT 31* 25*   BMET  Recent Labs  04/05/14 1417 04/06/14 0428  NA 141 141  K 4.3 4.6  CL 110 112  CO2 22 25  GLUCOSE 108* 138*  BUN 64* 65*  CREATININE 2.18* 2.28*  CALCIUM 8.9 8.9   PT/INR  Recent Labs  04/05/14 1417  LABPROT 16.0*  INR 1.27    STUDIES:  R/LHC Hemodynamic Findings: Ao: 146/30  LV: Aortic valve crossed RA: 5  RV: 42/3/9 PA: 42/13 (mean 25)  PCWP: 18 Fick Cardiac Output: 7.19 L/min Fick Cardiac Index: 3.6 L/min/m2 Central Aortic Saturation: 96% Pulmonary Artery Saturation: 64%  Angiographic Findings:  Left main: No obstructive disease.   Left Anterior Descending Artery: Large caliber vessel that courses to the apex. The proximal vessel is heavily calcified with diffuse 20% stenosis. The mid vessel is calcified with diffuse 20% stenosis. The distal vessel has mild plaque disease. There is a moderate caliber diagonal branch with no  obstructive disease.   Circumflex Artery: Large caliber vessel with moderate caliber first obtuse marginal branch and large caliber second obtuse marginal branch. No obstructive disease. There is calcification noted in throughout the obtuse marginal branches and the AV groove Circumflex.   Right Coronary Artery: Large dominant vessel with calcification noted in the mid vessel. The proximal vessel has 20% stenosis. The mid vessel has a focal calcified 40% stenosis. The distal vessel has diffuse 30% stenosis.   Left Ventricular Angiogram: Deferred.   Impression: 1. Mild non-obstructive CAD 2. Mild elevation filling pressures 3. Known failure of bioprosthetic aortic valve with severe AI/AS.     Assessment/Plan  Active Problems:   Severe aortic insufficiency   1. Severe Aortic Insufficiency: demonstrated on TEE and likely the cause of his fatigue and DOE. Currently undergoing work-up for valve-in-valve TAVR.   2. Mild Non-obstructive CAD: no need for revascularization. See cath findings above.   3. CKD: s/p contrast dye exposure for cath yesterday. Scr yesterday was 2.28. BMP pending for today. Lasix is on hold.   4. Chronic Diastolic HF: with trace bilateral LEE. Lasix on hold for recent cath. Continue BB.   5. Paroxsymal Atrial Fibrillation: in sinus brady. HR in the 50s. Not on anticoagulation due to chronic pancytopenia.   6. Pancytopenia: Chronic. 25 K/uL yesterday. He received platelets prior to cath. Hemostasis achieved at cath sites.  Will recheck CBC.   Dispo: home today if renal function and platelets are ok. BMP and CBC pending. F/U in valve clinic.    LOS: 2 days    Brittainy M. Ladoris Gene 04/07/2014 7:50 AM Patient seen and examined and history reviewed. Agree with above findings and plan. Patient doing well from cardiac cath yesterday. No access site complications. Urine output OK. Awaiting lab work this am. If renal function stable plan to DC today. He has  appt. With Dr. Benay Spice on Friday and will need to keep this appt.   Kaydin Karbowski [place], [place] 04/07/2014 8:50 AM

## 2014-04-07 NOTE — Progress Notes (Signed)
TR Band removed. Left Radial Level 0. Pressure dressing applied. Pt educated that dressing must stay in placed for 24hrs. RN will cont to montior.

## 2014-04-09 ENCOUNTER — Other Ambulatory Visit (HOSPITAL_BASED_OUTPATIENT_CLINIC_OR_DEPARTMENT_OTHER): Payer: Commercial Managed Care - HMO

## 2014-04-09 ENCOUNTER — Other Ambulatory Visit: Payer: Commercial Managed Care - HMO

## 2014-04-09 ENCOUNTER — Telehealth: Payer: Self-pay | Admitting: Oncology

## 2014-04-09 ENCOUNTER — Ambulatory Visit: Payer: Commercial Managed Care - HMO | Admitting: Oncology

## 2014-04-09 ENCOUNTER — Ambulatory Visit (HOSPITAL_BASED_OUTPATIENT_CLINIC_OR_DEPARTMENT_OTHER): Payer: Commercial Managed Care - HMO | Admitting: Oncology

## 2014-04-09 DIAGNOSIS — N289 Disorder of kidney and ureter, unspecified: Secondary | ICD-10-CM

## 2014-04-09 DIAGNOSIS — D649 Anemia, unspecified: Secondary | ICD-10-CM

## 2014-04-09 LAB — CBC WITH DIFFERENTIAL/PLATELET
BASO%: 0 % (ref 0.0–2.0)
Basophils Absolute: 0 10*3/uL (ref 0.0–0.1)
EOS%: 1.7 % (ref 0.0–7.0)
Eosinophils Absolute: 0 10*3/uL (ref 0.0–0.5)
HEMATOCRIT: 27.7 % — AB (ref 38.4–49.9)
HGB: 8.8 g/dL — ABNORMAL LOW (ref 13.0–17.1)
LYMPH#: 0.3 10*3/uL — AB (ref 0.9–3.3)
LYMPH%: 14.5 % (ref 14.0–49.0)
MCH: 27.1 pg — AB (ref 27.2–33.4)
MCHC: 31.8 g/dL — ABNORMAL LOW (ref 32.0–36.0)
MCV: 85.2 fL (ref 79.3–98.0)
MONO#: 0.1 10*3/uL (ref 0.1–0.9)
MONO%: 5.2 % (ref 0.0–14.0)
NEUT#: 1.4 10*3/uL — ABNORMAL LOW (ref 1.5–6.5)
NEUT%: 78.6 % — ABNORMAL HIGH (ref 39.0–75.0)
PLATELETS: 25 10*3/uL — AB (ref 140–400)
RBC: 3.25 10*6/uL — ABNORMAL LOW (ref 4.20–5.82)
RDW: 18.4 % — ABNORMAL HIGH (ref 11.0–14.6)
WBC: 1.7 10*3/uL — ABNORMAL LOW (ref 4.0–10.3)
nRBC: 0 % (ref 0–0)

## 2014-04-09 LAB — TECHNOLOGIST REVIEW

## 2014-04-09 MED ORDER — EPOETIN ALFA 20000 UNIT/ML IJ SOLN
40000.0000 [IU] | Freq: Once | INTRAMUSCULAR | Status: DC
  Filled 2014-04-09: qty 2

## 2014-04-09 MED ORDER — EPOETIN ALFA 40000 UNIT/ML IJ SOLN
40000.0000 [IU] | Freq: Once | INTRAMUSCULAR | Status: AC
  Administered 2014-04-09: 40000 [IU] via SUBCUTANEOUS
  Filled 2014-04-09: qty 1

## 2014-04-09 MED ORDER — FERROUS SULFATE 325 (65 FE) MG PO TBEC: 325.0000 mg | DELAYED_RELEASE_TABLET | Freq: Two times a day (BID) | ORAL | Status: DC

## 2014-04-09 NOTE — Telephone Encounter (Signed)
Pt confirmed labs/ov per 04/01 POF, gave pt AVS and Calendar... KJ  °

## 2014-04-09 NOTE — Progress Notes (Signed)
  [place] OFFICE PROGRESS NOTE   Diagnosis: Anemia, thrombocytopenia, splenomegaly  INTERVAL HISTORY:   [name] returns as scheduled. He is undergoing a cardiology evaluation for an aortic valve replacement. He underwent a cardiac catheterization 04/06/2014. He is scheduled for additional testing over the next 2 weeks. He developed bruising at IV puncture sites over the left arm. No other bleeding.  He continues to have malaise.  Objective:  Vital signs in last 24 hours:  Blood pressure 154/48, pulse 52, temperature 98 F (36.7 C), temperature source Oral, resp. rate 18, height $RemoveBe'5\' 10"'WduJbWvNx$  (1.778 m), weight 190 lb 12.8 oz (86.546 kg), SpO2 99 %.    Resp: Lungs clear bilaterally Cardio: Regular rate and rhythm, 2/6 systolic and diastolic murmurs GI: The spleen is palpable throughout the left abdomen with associated tenderness Vascular: Trace to 1+ edema at the low leg bilaterally  Skin: Ecchymoses at the left upper arm and right groin    Lab Results:  Lab Results  Component Value Date   WBC 1.7* 04/09/2014   HGB 8.8* 04/09/2014   HCT 27.7* 04/09/2014   MCV 85.2 04/09/2014   PLT 25* 04/09/2014   NEUTROABS 1.4* 04/09/2014     Medications: I have reviewed the patient's current medications.  Assessment/Plan: 1.Chronic pancytopenia/splenomegaly-likely related to a chronic lymphoproliferative disorder,? Splenic lymphoma   Bone marrow biopsy at [facility] November 2012-slightly hypercellular marrow with trilineage hematopoiesis, interstitial Nieman-Pick like histiocytosis. Negative for dysplasia, negative for lymphoma, negative for increased blasts. 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 History of severe aortic stenosis, status post aortic valve replacement surgery at [facility] on 01/23/2011  3. History of gout  4. Diabetes  5. Streptococcus bacteremia-TEE negative for endocarditis  6. Malaise-likely  multifactorial 7. Renal insufficiency  8. Paroxysmal atrial fibrillation  9. Colon polyps noted on a virtual colonoscopy 02/10/2013  10. Anemia secondary to renal insufficiency and the chronic lymphoproliferative disorder     Disposition:  He has persistent anemia. The anemia is likely in part secondary to renal insufficiency. He is symptomatic with fatigue and dyspnea secondary to anemia and heart disease. We decided to begin a trial of erythropoietin to see if a rise in the hemoglobin will help his symptoms. He continues to undergo an evaluation for a TAVR.  I reviewed the risk associated with erythropoietin therapy and he agrees to proceed. He will take iron while on erythropoietin. He will start with erythropoietin at a dose of 40,000 units weekly. [name] will return for an office visit in 3 weeks.  We can resume thrombopoetin therapy if the cardiologists would like to have his platelet count higher for interventions.  Betsy Coder, MD  04/09/2014  2:59 PM

## 2014-04-09 NOTE — Patient Instructions (Signed)

## 2014-04-13 ENCOUNTER — Telehealth: Payer: Self-pay | Admitting: Internal Medicine

## 2014-04-13 ENCOUNTER — Ambulatory Visit: Payer: Commercial Managed Care - HMO | Admitting: Oncology

## 2014-04-13 ENCOUNTER — Other Ambulatory Visit: Payer: Commercial Managed Care - HMO

## 2014-04-13 MED ORDER — SERTRALINE HCL 50 MG PO TABS: 50.0000 mg | ORAL_TABLET | Freq: Every day | ORAL | Status: DC

## 2014-04-13 NOTE — Telephone Encounter (Signed)
OK X 3 mos 

## 2014-04-13 NOTE — Telephone Encounter (Signed)
done

## 2014-04-13 NOTE — Telephone Encounter (Signed)
Patient needs refill on sertraline (ZOLOFT) 50 MG tablet [[id number]. Needs to be called into [facility]

## 2014-04-15 ENCOUNTER — Other Ambulatory Visit: Payer: Self-pay | Admitting: *Deleted

## 2014-04-15 ENCOUNTER — Other Ambulatory Visit (HOSPITAL_BASED_OUTPATIENT_CLINIC_OR_DEPARTMENT_OTHER): Payer: Commercial Managed Care - HMO

## 2014-04-15 ENCOUNTER — Ambulatory Visit (HOSPITAL_BASED_OUTPATIENT_CLINIC_OR_DEPARTMENT_OTHER): Payer: Commercial Managed Care - HMO

## 2014-04-15 VITALS — BP 152/33 | HR 49 | Temp 98.1°F

## 2014-04-15 DIAGNOSIS — N183 Chronic kidney disease, stage 3 unspecified: Secondary | ICD-10-CM

## 2014-04-15 DIAGNOSIS — D61818 Other pancytopenia: Secondary | ICD-10-CM | POA: Diagnosis not present

## 2014-04-15 DIAGNOSIS — D631 Anemia in chronic kidney disease: Secondary | ICD-10-CM

## 2014-04-15 DIAGNOSIS — N289 Disorder of kidney and ureter, unspecified: Secondary | ICD-10-CM

## 2014-04-15 DIAGNOSIS — N189 Chronic kidney disease, unspecified: Secondary | ICD-10-CM

## 2014-04-15 DIAGNOSIS — I350 Nonrheumatic aortic (valve) stenosis: Secondary | ICD-10-CM

## 2014-04-15 LAB — CBC WITH DIFFERENTIAL/PLATELET
BASO%: 0.5 % (ref 0.0–2.0)
Basophils Absolute: 0 10*3/uL (ref 0.0–0.1)
EOS%: 2.3 % (ref 0.0–7.0)
Eosinophils Absolute: 0.1 10*3/uL (ref 0.0–0.5)
HCT: 29.7 % — ABNORMAL LOW (ref 38.4–49.9)
HGB: 9.5 g/dL — ABNORMAL LOW (ref 13.0–17.1)
LYMPH#: 0.4 10*3/uL — AB (ref 0.9–3.3)
LYMPH%: 16.2 % (ref 14.0–49.0)
MCH: 27.1 pg — AB (ref 27.2–33.4)
MCHC: 32 g/dL (ref 32.0–36.0)
MCV: 84.9 fL (ref 79.3–98.0)
MONO#: 0.1 10*3/uL (ref 0.1–0.9)
MONO%: 6.5 % (ref 0.0–14.0)
NEUT%: 74.5 % (ref 39.0–75.0)
NEUTROS ABS: 1.6 10*3/uL (ref 1.5–6.5)
Platelets: 32 10*3/uL — ABNORMAL LOW (ref 140–400)
RBC: 3.5 10*6/uL — ABNORMAL LOW (ref 4.20–5.82)
RDW: 19.7 % — AB (ref 11.0–14.6)
WBC: 2.2 10*3/uL — ABNORMAL LOW (ref 4.0–10.3)
nRBC: 0 % (ref 0–0)

## 2014-04-15 MED ORDER — EPOETIN ALFA 20000 UNIT/ML IJ SOLN
40000.0000 [IU] | Freq: Once | INTRAMUSCULAR | Status: AC
  Administered 2014-04-15: 40000 [IU] via SUBCUTANEOUS
  Filled 2014-04-15: qty 2

## 2014-04-16 ENCOUNTER — Ambulatory Visit (HOSPITAL_COMMUNITY)
Admission: AD | Admit: 2014-04-16 | Discharge: 2014-04-16 | Disposition: A | Discharge status: Home / Self Care | Payer: Commercial Managed Care - HMO | Source: Ambulatory Visit | Attending: Cardiovascular Disease | Admitting: Cardiovascular Disease

## 2014-04-16 ENCOUNTER — Encounter (HOSPITAL_COMMUNITY): Payer: Self-pay

## 2014-04-16 VITALS — BP 159/33 | HR 48 | Temp 98.1°F | Resp 18 | Wt 183.0 lb

## 2014-04-16 DIAGNOSIS — N289 Disorder of kidney and ureter, unspecified: Secondary | ICD-10-CM

## 2014-04-16 DIAGNOSIS — I350 Nonrheumatic aortic (valve) stenosis: Secondary | ICD-10-CM

## 2014-04-16 MED ORDER — SODIUM BICARBONATE 8.4 % IV SOLN
INTRAVENOUS | Status: DC
  Filled 2014-04-16: qty 500

## 2014-04-16 MED ORDER — IOHEXOL 350 MG/ML SOLN
80.0000 mL | Freq: Once | INTRAVENOUS | Status: AC | PRN
  Administered 2014-04-16: 80 mL via INTRAVENOUS

## 2014-04-16 MED ORDER — SODIUM BICARBONATE BOLUS VIA INFUSION
INTRAVENOUS | Status: AC
  Administered 2014-04-16: 09:00:00 via INTRAVENOUS
  Filled 2014-04-16: qty 1

## 2014-04-16 NOTE — Progress Notes (Signed)
Pt received from CT to complete his Bicarb Gtt which continues to infuse at 83 cc/hr without difficulty.

## 2014-04-16 NOTE — Progress Notes (Signed)
Bicarb infusion complete and existing IV from Radiology d/ced to right forearm with site intact.

## 2014-04-19 ENCOUNTER — Other Ambulatory Visit: Payer: Self-pay | Admitting: *Deleted

## 2014-04-19 ENCOUNTER — Telehealth: Payer: Self-pay | Admitting: Cardiovascular Disease

## 2014-04-19 DIAGNOSIS — N289 Disorder of kidney and ureter, unspecified: Secondary | ICD-10-CM

## 2014-04-19 DIAGNOSIS — I350 Nonrheumatic aortic (valve) stenosis: Secondary | ICD-10-CM

## 2014-04-19 NOTE — Telephone Encounter (Signed)
I spoke with the pt and he denies CP and SOB.  The pt states that his weight has increased over the past few days and these are listed in the telephone encounter. The pt held his diuretic last Friday prior to Cardiac CT and is due to hold his diuretic again on Thursday morning prior to CTA.  I discussed this pt with Dr Burt Knack and at this time he recommends continued observation. He would like the pt to contact our office if his weight continues to increase and he will make further recommendations. Pt agreed with plan.

## 2014-04-19 NOTE — Telephone Encounter (Signed)
New message    Patient calling C/O weight gain lb in the last 2 days.   Today  186.  Yesterday 184.3  182.2    No chest pain , no sob.

## 2014-04-21 LAB — BASIC METABOLIC PANEL
BUN: 62 mg/dL — ABNORMAL HIGH (ref 6–23)
CO2: 20 mEq/L (ref 19–32)
CREATININE: 2.73 mg/dL — AB (ref 0.50–1.35)
Calcium: 8.3 mg/dL — ABNORMAL LOW (ref 8.4–10.5)
Chloride: 108 mEq/L (ref 96–112)
Glucose, Bld: 136 mg/dL — ABNORMAL HIGH (ref 70–99)
Potassium: 4.3 mEq/L (ref 3.5–5.3)
SODIUM: 140 meq/L (ref 135–145)

## 2014-04-22 ENCOUNTER — Ambulatory Visit (HOSPITAL_COMMUNITY): Payer: Commercial Managed Care - HMO

## 2014-04-22 ENCOUNTER — Ambulatory Visit (HOSPITAL_COMMUNITY)
Admission: AD | Admit: 2014-04-22 | Discharge: 2014-04-22 | Disposition: A | Discharge status: Home / Self Care | Payer: Commercial Managed Care - HMO | Source: Ambulatory Visit | Attending: Cardiovascular Disease | Admitting: Cardiovascular Disease

## 2014-04-22 ENCOUNTER — Ambulatory Visit (HOSPITAL_COMMUNITY)
Admit: 2014-04-22 | Discharge: 2014-04-22 | Disposition: A | Discharge status: Home / Self Care | Payer: Commercial Managed Care - HMO | Attending: Surgery | Admitting: Surgery

## 2014-04-22 VITALS — BP 163/39 | HR 50 | Resp 18

## 2014-04-22 DIAGNOSIS — R188 Other ascites: Secondary | ICD-10-CM | POA: Insufficient documentation

## 2014-04-22 DIAGNOSIS — K766 Portal hypertension: Secondary | ICD-10-CM | POA: Insufficient documentation

## 2014-04-22 DIAGNOSIS — K5730 Diverticulosis of large intestine without perforation or abscess without bleeding: Secondary | ICD-10-CM | POA: Insufficient documentation

## 2014-04-22 DIAGNOSIS — I350 Nonrheumatic aortic (valve) stenosis: Secondary | ICD-10-CM | POA: Diagnosis not present

## 2014-04-22 DIAGNOSIS — I2510 Atherosclerotic heart disease of native coronary artery without angina pectoris: Secondary | ICD-10-CM | POA: Insufficient documentation

## 2014-04-22 DIAGNOSIS — Z01818 Encounter for other preprocedural examination: Secondary | ICD-10-CM | POA: Insufficient documentation

## 2014-04-22 DIAGNOSIS — J8410 Pulmonary fibrosis, unspecified: Secondary | ICD-10-CM | POA: Insufficient documentation

## 2014-04-22 DIAGNOSIS — I517 Cardiomegaly: Secondary | ICD-10-CM | POA: Insufficient documentation

## 2014-04-22 DIAGNOSIS — N261 Atrophy of kidney (terminal): Secondary | ICD-10-CM | POA: Insufficient documentation

## 2014-04-22 DIAGNOSIS — I700 Atherosclerosis of aorta: Secondary | ICD-10-CM | POA: Diagnosis not present

## 2014-04-22 DIAGNOSIS — N281 Cyst of kidney, acquired: Secondary | ICD-10-CM | POA: Insufficient documentation

## 2014-04-22 DIAGNOSIS — N289 Disorder of kidney and ureter, unspecified: Secondary | ICD-10-CM

## 2014-04-22 DIAGNOSIS — K7460 Unspecified cirrhosis of liver: Secondary | ICD-10-CM | POA: Diagnosis not present

## 2014-04-22 DIAGNOSIS — R161 Splenomegaly, not elsewhere classified: Secondary | ICD-10-CM | POA: Diagnosis not present

## 2014-04-22 DIAGNOSIS — R911 Solitary pulmonary nodule: Secondary | ICD-10-CM | POA: Insufficient documentation

## 2014-04-22 LAB — PULMONARY FUNCTION TEST
DL/VA % pred: 66 %
DL/VA: 3.03 ml/min/mmHg/L
DLCO UNC % PRED: 50 %
DLCO unc: 16.45 ml/min/mmHg
FEF 25-75 POST: 2.45 L/s
FEF 25-75 Pre: 1.87 L/sec
FEF2575-%CHANGE-POST: 31 %
FEF2575-%PRED-POST: 166 %
FEF2575-%Pred-Pre: 127 %
FEV1-%CHANGE-POST: 5 %
FEV1-%PRED-PRE: 107 %
FEV1-%Pred-Post: 113 %
FEV1-POST: 2.76 L
FEV1-PRE: 2.61 L
FEV1FVC-%CHANGE-POST: 4 %
FEV1FVC-%Pred-Pre: 104 %
FEV6-%Change-Post: 2 %
FEV6-%Pred-Post: 110 %
FEV6-%Pred-Pre: 107 %
FEV6-Post: 3.63 L
FEV6-Pre: 3.54 L
FEV6FVC-%Change-Post: 1 %
FEV6FVC-%Pred-Post: 107 %
FEV6FVC-%Pred-Pre: 106 %
FVC-%CHANGE-POST: 1 %
FVC-%Pred-Post: 102 %
FVC-%Pred-Pre: 101 %
FVC-Post: 3.66 L
FVC-Pre: 3.61 L
POST FEV1/FVC RATIO: 75 %
PRE FEV1/FVC RATIO: 72 %
Post FEV6/FVC ratio: 99 %
Pre FEV6/FVC Ratio: 98 %
RV % pred: 104 %
RV: 3.01 L
TLC % pred: 98 %
TLC: 6.97 L

## 2014-04-22 MED ORDER — ALBUTEROL SULFATE (2.5 MG/3ML) 0.083% IN NEBU
2.5000 mg | INHALATION_SOLUTION | Freq: Once | RESPIRATORY_TRACT | Status: AC
  Administered 2014-04-22: 2.5 mg via RESPIRATORY_TRACT

## 2014-04-22 MED ORDER — SODIUM BICARBONATE BOLUS VIA INFUSION
INTRAVENOUS | Status: DC
  Filled 2014-04-22: qty 1

## 2014-04-22 MED ORDER — SODIUM BICARBONATE 8.4 % IV SOLN
INTRAVENOUS | Status: DC
  Filled 2014-04-22: qty 500

## 2014-04-22 MED ORDER — IOHEXOL 350 MG/ML SOLN
80.0000 mL | Freq: Once | INTRAVENOUS | Status: AC | PRN
  Administered 2014-04-22: 80 mL via INTRAVENOUS

## 2014-04-22 MED ORDER — SODIUM BICARBONATE BOLUS VIA INFUSION
INTRAVENOUS | Status: AC
  Administered 2014-04-22: 12:00:00 via INTRAVENOUS
  Filled 2014-04-22: qty 1

## 2014-04-22 MED FILL — Dextrose Inj 5%: INTRAVENOUS | Qty: 500 | Status: AC

## 2014-04-22 MED FILL — Sodium Bicarbonate IV Soln 8.4%: INTRAVENOUS | Qty: 75 | Status: AC

## 2014-04-22 NOTE — Progress Notes (Signed)
Received from radiology via wheelchair with sodium bicarb drip infusing until 1530

## 2014-04-23 ENCOUNTER — Other Ambulatory Visit (HOSPITAL_BASED_OUTPATIENT_CLINIC_OR_DEPARTMENT_OTHER): Payer: Commercial Managed Care - HMO

## 2014-04-23 ENCOUNTER — Other Ambulatory Visit: Payer: Self-pay | Admitting: *Deleted

## 2014-04-23 ENCOUNTER — Ambulatory Visit (HOSPITAL_BASED_OUTPATIENT_CLINIC_OR_DEPARTMENT_OTHER): Payer: Commercial Managed Care - HMO

## 2014-04-23 VITALS — BP 143/45 | HR 51 | Temp 98.0°F

## 2014-04-23 DIAGNOSIS — N183 Chronic kidney disease, stage 3 unspecified: Secondary | ICD-10-CM

## 2014-04-23 DIAGNOSIS — D631 Anemia in chronic kidney disease: Secondary | ICD-10-CM

## 2014-04-23 DIAGNOSIS — N289 Disorder of kidney and ureter, unspecified: Secondary | ICD-10-CM

## 2014-04-23 DIAGNOSIS — D61818 Other pancytopenia: Secondary | ICD-10-CM

## 2014-04-23 DIAGNOSIS — N189 Chronic kidney disease, unspecified: Secondary | ICD-10-CM

## 2014-04-23 LAB — CBC WITH DIFFERENTIAL/PLATELET
BASO%: 0 % (ref 0.0–2.0)
Basophils Absolute: 0 10*3/uL (ref 0.0–0.1)
EOS%: 2 % (ref 0.0–7.0)
Eosinophils Absolute: 0 10*3/uL (ref 0.0–0.5)
HCT: 31.1 % — ABNORMAL LOW (ref 38.4–49.9)
HEMOGLOBIN: 9.8 g/dL — AB (ref 13.0–17.1)
LYMPH%: 15.8 % (ref 14.0–49.0)
MCH: 27.5 pg (ref 27.2–33.4)
MCHC: 31.5 g/dL — ABNORMAL LOW (ref 32.0–36.0)
MCV: 87.1 fL (ref 79.3–98.0)
MONO#: 0.1 10*3/uL (ref 0.1–0.9)
MONO%: 5.1 % (ref 0.0–14.0)
NEUT#: 1.5 10*3/uL (ref 1.5–6.5)
NEUT%: 77.1 % — ABNORMAL HIGH (ref 39.0–75.0)
NRBC: 0 % (ref 0–0)
Platelets: 26 10*3/uL — ABNORMAL LOW (ref 140–400)
RBC: 3.57 10*6/uL — ABNORMAL LOW (ref 4.20–5.82)
RDW: 21.1 % — AB (ref 11.0–14.6)
WBC: 2 10*3/uL — ABNORMAL LOW (ref 4.0–10.3)
lymph#: 0.3 10*3/uL — ABNORMAL LOW (ref 0.9–3.3)

## 2014-04-23 MED ORDER — EPOETIN ALFA 40000 UNIT/ML IJ SOLN
40000.0000 [IU] | Freq: Once | INTRAMUSCULAR | Status: AC
  Administered 2014-04-23: 40000 [IU] via SUBCUTANEOUS
  Filled 2014-04-23: qty 1

## 2014-04-26 ENCOUNTER — Encounter: Payer: Self-pay | Admitting: Physical Therapy

## 2014-04-26 ENCOUNTER — Ambulatory Visit
Payer: Commercial Managed Care - HMO | Attending: Thoracic Surgery (Cardiothoracic Vascular Surgery) | Admitting: Physical Therapy

## 2014-04-26 ENCOUNTER — Encounter: Payer: Self-pay | Admitting: Thoracic Surgery (Cardiothoracic Vascular Surgery)

## 2014-04-26 ENCOUNTER — Institutional Professional Consult (permissible substitution) (INDEPENDENT_AMBULATORY_CARE_PROVIDER_SITE_OTHER): Payer: Commercial Managed Care - HMO | Admitting: Thoracic Surgery (Cardiothoracic Vascular Surgery)

## 2014-04-26 VITALS — BP 125/38 | HR 51 | Resp 16 | Ht 70.0 in | Wt 190.0 lb

## 2014-04-26 DIAGNOSIS — I350 Nonrheumatic aortic (valve) stenosis: Secondary | ICD-10-CM | POA: Diagnosis not present

## 2014-04-26 DIAGNOSIS — R262 Difficulty in walking, not elsewhere classified: Secondary | ICD-10-CM | POA: Diagnosis not present

## 2014-04-26 DIAGNOSIS — R293 Abnormal posture: Secondary | ICD-10-CM | POA: Diagnosis present

## 2014-04-26 NOTE — Therapy (Signed)
[place], [place], [address] Phone: [phone]   Fax:  [phone]  Physical Therapy Evaluation  Patient Details  Name: [name] MRN: [id number] Date of Birth: 79/15/1924 Referring Provider:  Rexene Alberts, MD  Encounter Date: 04/26/2014      PT End of Session - 04/26/14 1148    Visit Number 1   PT Start Time 2376   PT Stop Time 1230   PT Time Calculation (min) 43 min      Past Medical History  Diagnosis Date  . Stroke   . Atrial fibrillation     a. amiodarone therapy; not felt to be a candidate for anticoagulation - bruises easily.  . Bradycardia     a. only tolerates metoprolol 12.5mg  daily (bradycardic w/ bid dosing).  . Aortic stenosis     a. Previously severe -> s/p minimally invasive tissue AVR with Dr. Evelina Dun at [facility] 01/2011 (pre-AVR cath with no obs CAD);  b. 12/2012 TEE: EF 60-65%, no veg, mild AS, triv AI.  [name] Kitchen Hypertension   . Anemia   . Action tremor   . Thrombocytopenia     Dr. Benay Spice  . Colon polyp   . Anxiety   . Splenomegaly   . Gout   . GERD (gastroesophageal reflux disease)   . Hypothyroidism   . Leukopenia     Chronic pancytopenia  . Degenerative disc disease   . Joint effusion, knee     left knee  . Synovial cyst of popliteal space   . Cellulitis of left leg 10/11-16/2012  . CKD (chronic kidney disease), stage III   . Chronic diastolic CHF (congestive heart failure)     a. 12/15/2012 TEE: EF 60-65%, no veg.  . Bacteremia     a. 12/2012 - S bovis;  b. 12/2012 TEE w/o veg;  c. Seeing ID->Rocephin therapy extended to 01/25/2013 via PICC for possible endocarditis (No veg on TEE).  . High cholesterol   . Heart murmur   . Diabetes mellitus     "borderline" (01/05/2013)  . History of blood transfusion 01/2011; 11/2012    "related to heart valve replaced; low HgB count" (01/05/2013)  . H/O hiatal hernia   . Throat cancer     s/p lasered  . Diverticulosis   .  Severe aortic insufficiency 04/05/2014    Past Surgical History  Procedure Laterality Date  . Cholecystectomy    . Joint effusion      left knee  . Aortic valve replacement  01/23/2011    via minimally invasive approach per Dr Evelina Dun, [facility]  . Tee without cardioversion N/A 12/15/2012    Procedure: TRANSESOPHAGEAL ECHOCARDIOGRAM (TEE);  Surgeon: [name], MD;  Location: [facility] ENDOSCOPY;  Service: Cardiovascular;  Laterality: N/A;  . Cardiac valve replacement    . Inguinal hernia repair Right   . Excisional hemorrhoidectomy    . Cardiac catheterization    . Surgery scrotal / testicular      "removed one" (01/05/2013)  . 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 Tee without cardioversion N/A 03/26/2014    Procedure: TRANSESOPHAGEAL ECHOCARDIOGRAM (TEE);  Surgeon: Josue Hector, MD;  Location: [facility] ENDOSCOPY;  Service: Cardiovascular;  Laterality: N/A;    There were no vitals filed for this visit.  Visit Diagnosis:  Posture  abnormality - Plan: PT PLAN OF CARE CERT/RE-CERT  Difficulty in walking - Plan: PT PLAN OF CARE CERT/RE-CERT      Subjective Assessment - 04/26/14 1150    Subjective reports had surgery for aortic stensois x 3 years ago and noticed a decline in status about 6-7 months ago, followed up with Dr. Burt Knack and learned valve was leaking; cc at this time is giving out of energy - notices more with negotiating inclines   Patient Stated Goals 79 improve mobility   Currently in Pain? Yes   Pain Score 7    Pain Location Arm   Pain Orientation Right;Upper;Proximal;Posterior;Lateral   Pain Descriptors / Indicators Burning   Pain Type Acute pain  injury 03/11/14   Pain Onset More than a month ago   Pain Frequency Intermittent   Aggravating Factors  RUE use    Pain Relieving Factors rest    Effect of Pain on Daily Activities slower  dressing, sees orthopedic MD tomorrow            [facility] PT Assessment - 04/26/14 0001    Assessment   Medical Diagnosis severe aortic stenosis   Onset Date 09/25/13  approximate   Precautions   Precautions None   Restrictions   Weight Bearing Restrictions No   Balance Screen   Has the patient fallen in the past 6 months No   Has the patient had a decrease in activity level because of a fear of falling?  No   Is the patient reluctant to leave their home because of a fear of falling?  No   Home Environment   Living Enviornment Private residence   Living Arrangements Spouse/significant other   Type of [place] to enter   Entrance Stairs-Number of Steps 2-3   Entrance Stairs-Rails Left   Prior Function   Level of Independence Independent with gait;Independent with basic ADLs   Cognition   Overall Cognitive Status Within Functional Limits for tasks assessed   Posture/Postural Control   Posture/Postural Control Postural limitations   Postural Limitations Rounded Shoulders;Forward head  mild, R shoulder depressed compared to L   ROM / Strength   AROM / PROM / Strength AROM;Strength   AROM   Overall AROM Comments R shoulder limited due to injury on 03/11/14 when throwing sticks, going to MD tomorrow for evaluation, otherwise ROM WFL throughout   AROM Assessment Site Shoulder   Right/Left Shoulder Right   Right Shoulder Flexion 85 Degrees  approximate with pain   Right Shoulder ABduction 85 Degrees  approximate with pain   Strength   Strength Assessment Site Hand  R hand dominant   Right/Left hand Right;Left   Grip (lbs) R 45    Grip (lbs) L 44   Ambulation/Gait   Gait Comments Pt appears steady with no significant deviations.   6 Minute Walk- Baseline   HR (bpm) (!) 49  reports this is normal          [facility] Pre-Surgical Assessment - 04/26/14 0001    5 Meter Walk Test- trial 1 7 sec   5 Meter Walk Test- trial 2 6 sec.    5 Meter Walk Test-  trial 3 6 sec.  > 6 sec considered slow speed   5 meter walk test average 6.33 sec   Timed Up & Go Test trial  14 sec.   Comments </= 12 sec WNL   4 Stage Balance Test tolerated for:  10 sec.   4 Stage  Balance Test Position 3   comment not indicative of high fall risk   Sit To Stand Test- trial 1 18 sec.   Comment 14.8 sec WNL for 85-89 yo   ADL/IADL Independent with: Bathing;Dressing;Meal prep;Finances;Yard work   ADL/IADL Therapist, sports Index Vulnerable   6 Minute Walk- Baseline yes   BP (mmHg) 154/50 mmHg   02 Sat (%RA) 97 %   Modified Borg Scale for Dyspnea 0- Nothing at all   Perceived Rate of Exertion (Borg) 6-   6 Minute Walk Post Test yes   BP (mmHg) 180/50 mmHg   HR (bpm) 55   02 Sat (%RA) 98 %   Modified Borg Scale for Dyspnea 2- Mild shortness of breath   Perceived Rate of Exertion (Borg) 13- Somewhat hard   Aerobic Endurance Distance Walked 955   Endurance additional comments no rest breaks                                     Plan - 04/28/14 1244    Clinical Impression Statement Pt is a 81 yo male presenting to OP PT for evaluation pre-TAVR surgery. Pt reports he had a valve replaced about 3 years ago and notice a decline in his status about 6-7 months ago. Pt's primary co is overall fatigue and SOB. Pt continues to be moderately active and demonstrates good mobility overall. Pt's BP did increase significantly with 6 minute walk test. Pt was at increased fall risk per Timed up and Go. Pt's has a injury limited R shoulder ROM which will be further evaluated by orthopedic MD tomorrow.    PT Frequency One time visit          G-Codes - 2014/04/28 1249    Functional Assessment Tool Used 6 minute walk 955'   Functional Limitation Mobility: Walking and moving around   Mobility: Walking and Moving Around Current Status [phone]) At least 20 percent but less than 40 percent impaired, limited or restricted   Mobility: Walking and Moving Around Goal Status  [phone]) At least 20 percent but less than 40 percent impaired, limited or restricted   Mobility: Walking and Moving Around Discharge Status [phone]) At least 20 percent but less than 40 percent impaired, limited or restricted       Problem List Patient Active Problem List   Diagnosis Date Noted  . Severe aortic insufficiency 04/05/2014  . Depression 10/02/2013  . Fatigue 08/03/2013  . Renal insufficiency 08/03/2013  . Cellulitis of leg, left 08/01/2013  . Aortic atherosclerosis 01/06/2013  . PAF (paroxysmal atrial fibrillation) 01/06/2013  . Lower extremity edema 01/06/2013  . CKD (chronic kidney disease), stage III 01/06/2013  . Bacteremia 12/30/2012  . S/P AVR 12/13/2012  . Protein-calorie malnutrition, severe 12/13/2012  . Sinus bradycardia 10/04/2011  . Hypothyroidism 02/27/2011  . Restless legs 02/27/2011  . Other pancytopenia 12/06/2009  . HYPERURICEMIA, ASYMPTOMATIC 07/25/2009  . HYPERGLYCEMIA, FASTING 01/05/2009  . CAD, NATIVE VESSEL 12/21/2008  . DIABETES MELLITUS, CONTROLLED 10/06/2008  . AORTIC STENOSIS 05/15/2008  . ACTION TREMOR 01/14/2007  . POPLITEAL CYST, RIGHT 11/05/2006  . GOUT 05/13/2006  . THROMBOCYTOPENIA 05/13/2006  . ANXIETY 05/13/2006  . HYPERTENSION 05/13/2006  . CVA 05/13/2006  . GERD 05/13/2006  . [place] DISEASE 05/13/2006  . Splenomegaly 05/13/2006    [place], PT 04-28-2014, 12:51 PM  [facility] [address] [place], [place], [address] Phone: [phone]  Fax:  [phone]

## 2014-04-26 NOTE — Progress Notes (Addendum)
[facility] CONSULTATION REPORT  Referring Provider is [name], MD PCP is [name], MD  Chief Complaint  Patient presents with  . Aortic Stenosis    2nd TAVR EVAL    HPI:  Patient is a 81 year old male with history of aortic stenosis status post aortic valve replacement using a 25 mm Medtronic Mosaic porcine bioprosthetic tissue valve via right anterior mini thoracotomy approach by Dr. Evelina Dun at [facility] in January 2013 who has been referred for a second opinion surgical consult to discuss treatment options for management of severe prosthetic valve dysfunction.    The patient has several complex medical problems including aortic stenosis, chronic diastolic congestive heart failure, hypertension, stage III chronic kidney disease, atrial fibrillation with previous stroke, and severe chronic thrombocytopenia and leukopenia of unclear etiology.  He previously had been followed for many years by Dr. Benay Spice for his hematologic disorder and Dr. Verl Blalock for aortic stenosis, hypertension, and atrial fibrillation.  He has been felt not to be a candidate for anticoagulation due to his severe chronic thrombocytopenia.  Follow-up echocardiogram performed in the fall of 2012 demonstrated significant progression of the severity of aortic stenosis, and the patient was referred to [facility] to consider transcatheter aortic valve replacement. The patient was not felt to be a candidate for TAVR and he subsequently underwent conventional surgical aortic valve replacement via right anterior mini-thoracotomy approach in January of 2013. His postoperative recovery was notable for recurrent paroxysmal atrial fibrillation and a transient acute exacerbation of chronic kidney disease, but he otherwise recovered uneventfully.  Follow-up transthoracic echocardiogram was performed 03/01/2011  that revealed normal left ventricular systolic function with ejection fraction estimated 60%. There was report at that time of an eccentric jet of aortic insufficiency felt worrisome for possible perivalvular leak.  He was seen in follow-up up proximally one month later at which time the patient was doing well clinically. Over the ensuing year the patient had intermittent problems with recurrent paroxysmal atrial fibrillation, but overall he remained fairly stable from a cardiac standpoint.  He was hospitalized in early December 2014 with progressive fatigue and dyspnea on exertion. Blood cultures were positive for group D Streptococcus consistent with likely Strep bovis.  Transthoracic and transesophageal echocardiograms did not reveal clear vegetations or significant aortic insufficiency.  He was treated with a 6 week course of intravenous Rocephin.  The patient was referred to Dr. Henrene Pastor and underwent virtual colonoscopy which revealed multiple small polyps and sigmoid diverticulosis but no suspicious masses to suggest the possibility of colon cancer.  He remained clinically stable from a cardiac standpoint with chronic symptoms of fatigue and exertional shortness of breath for which he has been followed by Dr. Burt Knack, Dr. Linna Darner and Dr. Benay Spice.   He has developed progressive prosthetic valve dysfunction on serial echocardiograms, with most recent transthoracic ECHO performed on 03/11/2014 demonstrating calcified leaflets with some echogenic material prolapsing into the LVOT, severe aortic stenosis with peak velocity measured across the aortic valve in excess of 4.0 m/sec corresponding to mean transvalvular gradient of 41 mmHg, and at least moderate aortic insufficiency. He was referred for surgical consultation and evaluated by Dr. [name] on 03/18/2014 who felt the patient should not be considered a candidate for conventional surgical redo aortic valve replacement and agreed that TAVR might be a reasonable  alternative.  TEE was recommended to make certain that the patient's aortic insufficiency was due to leaflet  dysfunction rather than a paravalvular leak, and the patient subsequently underwent TEE by Dr. Johnsie Cancel on 03/26/2014.  Left and right heart catheterization was performed, confirming the absence of significant coronary artery disease, and the patient has since then undergone CT angiography to further characterize the anatomical feasibility of TAVR.  He now has been referred for a second opinion surgical consultation.  Patient is married and lives locally in [place] with his wife. He has been retired for many years but he has remained very active throughout retirement.  He has developed slow progression of symptoms of chronic fatigue with exertional shortness of breath. He has chronic bilateral lower extremity edema.  He denies any symptoms of resting shortness of breath, PND, or orthopnea. He has not had dizzy spells, nor syncope. He experiences occasional mild tightness across his chest when he is short of breath. He now gets short of breath and fatigue on a regular basis with mild to moderate exertion.  Despite his advanced age and numerous comorbid medical problems, his physical mobility and quality of life are not limited significantly by other medical problems.  Past Medical History  Diagnosis Date  . Stroke   . Atrial fibrillation     a. amiodarone therapy; not felt to be a candidate for anticoagulation - bruises easily.  . Bradycardia     a. only tolerates metoprolol 12.5mg  daily (bradycardic w/ bid dosing).  . Aortic stenosis     a. Previously severe -> s/p minimally invasive tissue AVR with Dr. Evelina Dun at [facility] 01/2011 (pre-AVR cath with no obs CAD);  b. 12/2012 TEE: EF 60-65%, no veg, mild AS, triv AI.  [name] Kitchen Hypertension   . Anemia   . Action tremor   . Thrombocytopenia     Dr. Benay Spice  . Colon polyp   . Anxiety   . Splenomegaly   . Gout   . GERD (gastroesophageal reflux disease)     . Hypothyroidism   . Leukopenia     Chronic pancytopenia  . Degenerative disc disease   . Joint effusion, knee     left knee  . Synovial cyst of popliteal space   . Cellulitis of left leg 10/11-16/2012  . CKD (chronic kidney disease), stage III   . Chronic diastolic CHF (congestive heart failure)     a. 12/15/2012 TEE: EF 60-65%, no veg.  . Bacteremia     a. 12/2012 - S bovis;  b. 12/2012 TEE w/o veg;  c. Seeing ID->Rocephin therapy extended to 01/25/2013 via PICC for possible endocarditis (No veg on TEE).  . High cholesterol   . Heart murmur   . Diabetes mellitus     "borderline" (01/05/2013)  . History of blood transfusion 01/2011; 11/2012    "related to heart valve replaced; low HgB count" (01/05/2013)  . H/O hiatal hernia   . Throat cancer     s/p lasered  . Diverticulosis   . Severe aortic insufficiency 04/05/2014    Past Surgical History  Procedure Laterality Date  . Cholecystectomy    . Joint effusion      left knee  . Aortic valve replacement  01/23/2011    via minimally invasive approach per Dr Evelina Dun, [facility]  . Tee without cardioversion N/A 12/15/2012    Procedure: TRANSESOPHAGEAL ECHOCARDIOGRAM (TEE);  Surgeon: [name], MD;  Location: [facility] ENDOSCOPY;  Service: Cardiovascular;  Laterality: N/A;  . Cardiac valve replacement    . Inguinal hernia repair Right   . Excisional hemorrhoidectomy    . Cardiac catheterization    .  Surgery scrotal / testicular      "removed one" (01/05/2013)  . 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 Tee without cardioversion N/A 03/26/2014    Procedure: TRANSESOPHAGEAL ECHOCARDIOGRAM (TEE);  Surgeon: Josue Hector, MD;  Location: [facility] ENDOSCOPY;  Service: Cardiovascular;  Laterality: N/A;    Family History  Problem Relation Age of Onset  . Colon cancer    . Stroke    . Cancer Other      colon  . Stroke Other     History   Social History  . Marital Status: Married    Spouse Name: N/A  . Number of Children: 3  . Years of Education: N/A   Occupational History  . Retired Agricultural consultant    Social History Main Topics  . Smoking status: Former Smoker -- 0.75 packs/day for 24 years    Types: Cigarettes    Quit date: 01/11/1962  . Smokeless tobacco: Never Used  . Alcohol Use: No  . Drug Use: No  . Sexual Activity: No   Other Topics Concern  . Not on file   Social History Narrative   L[place] at his farm outside of [place]...does some work   Lives with his wife, been married to her for 71 years,   Smoked until 1964 about 7 cigarettes a day for 30 years   No alcohol history.   No drug history                Current Outpatient Prescriptions  Medication Sig Dispense Refill  . allopurinol (ZYLOPRIM) 100 MG tablet Take 1 tablet (100 mg total) by mouth daily. 90 tablet 0  . amiodarone (PACERONE) 200 MG tablet Take 1 tablet (200 mg total) by mouth daily. 90 tablet 1  . amLODipine (NORVASC) 5 MG tablet Take 1 tablet (5 mg total) by mouth daily. 90 tablet 1  . aspirin 325 MG tablet Take 325 mg by mouth daily.      . Cholecalciferol (VITAMIN D) 1000 UNITS capsule Take 1 capsule (1,000 Units total) by mouth daily. 90 capsule 3  . clonazePAM (KLONOPIN) 0.5 MG tablet 1/2 as needed if you wake up early (Patient taking differently: Take 0.5 mg by mouth daily as needed (wake up early). ) 30 tablet 1  . colchicine 0.6 MG tablet Take 0.6 mg by mouth daily as needed (gout flares). Take one tablet by mouth daily during an acute episode of gout.    . ferrous sulfate 325 (65 FE) MG EC tablet Take 1 tablet (325 mg total) by mouth 2 (two) times daily.  3  . fish oil-omega-3 fatty acids 1000 MG capsule Take 1 g by mouth daily.      . Flaxseed, Linseed, (FLAX SEED OIL) 1000 MG CAPS Take 1 capsule by mouth daily.     . furosemide (LASIX) 80 MG tablet Take 1 tablet (80 mg total) by mouth 2 (two)  times daily. 90 tablet 1  . levothyroxine (LEVOTHROID) 25 MCG tablet Take 1 tablet (25 mcg total) by mouth daily. 90 tablet 1  . Multiple Vitamin (MULTIVITAMIN) tablet Take 1 tablet by mouth daily.      . mupirocin ointment (BACTROBAN) 2 % Applied twice a day to the affected area 22 g 0  . omeprazole (PRILOSEC) 20 MG capsule Take 1 capsule (20 mg total) by mouth  daily. 90 capsule 1  . potassium chloride (K-DUR) 10 MEQ tablet Take 1 tablet (10 mEq total) by mouth daily. 90 tablet 1  . propranolol (INDERAL) 10 MG tablet Take 1 tablet (10 mg total) by mouth 2 (two) times daily. 180 tablet 1  . sertraline (ZOLOFT) 50 MG tablet Take 1 tablet (50 mg total) by mouth daily. 90 tablet 0  . vitamin C (ASCORBIC ACID) 500 MG tablet Take 1 tablet (500 mg total) by mouth daily. 90 tablet 3   No current facility-administered medications for this visit.    Allergies  Allergen Reactions  . Penicillins Other (See Comments)    Does not remember reaction (~year 1948)  . Neomycin-Bacitracin Zn-Polymyx Other (See Comments)    ? Reaction (thinks he remembers redness)      Review of Systems:   General:  stable appetite, decreased energy, no weight gain, no weight loss, no fever  Cardiac:  + chest pain with exertion, no chest pain at rest, + SOB with exertion, no resting SOB, no PND, no orthopnea, + palpitations, + arrhythmia, + atrial fibrillation, + LE edema, no dizzy spells, no syncope  Respiratory:  + exertional shortness of breath, no home oxygen, no productive cough, no dry cough, no bronchitis, no wheezing, no hemoptysis, no asthma, no pain with inspiration or cough, no sleep apnea, no CPAP at night  GI:   no difficulty swallowing, no reflux, no frequent heartburn, no hiatal hernia, no abdominal pain, no constipation, no diarrhea, no hematochezia, no hematemesis, no melena  GU:   no dysuria,  no frequency, no urinary tract infection, no hematuria, no enlarged prostate, no kidney stones, + chronic kidney  disease  Vascular:  no pain suggestive of claudication, no pain in feet, no leg cramps, no varicose veins, no DVT, no non-healing foot ulcer  Neuro:   no stroke, no TIA's, no seizures, no headaches, no temporary blindness one eye,  no slurred speech, no peripheral neuropathy, no chronic pain, no instability of gait, no memory/cognitive dysfunction  Musculoskeletal: no arthritis, no joint swelling, no myalgias, no difficulty walking, normal mobility   Skin:   no rash, no itching, no skin infections, no pressure sores or ulcerations  Psych:   no anxiety, + depression, no nervousness, no unusual recent stress  Eyes:   no blurry vision, no floaters, no recent vision changes, does not wear glasses or contacts  ENT:   + hearing loss, no loose or painful teeth, + dentures, last saw dentist within the past year - was unaware of need for antibiotic dental prophylaxis  Hematologic:  + easy bruising, no abnormal bleeding, no clotting disorder, no frequent epistaxis  Endocrine:  + diabetes, routinely checks CBG's at home           Physical Exam:   BP 125/38 mmHg  Pulse 51  Resp 16  Ht 5\' 10"  (1.778 m)  Wt 190 lb (86.183 kg)  BMI 27.26 kg/m2  SpO2 96%  General:  Elderly but well-appearing  HEENT:  Unremarkable   Neck:   no JVD, no bruits, no adenopathy   Chest:   clear to auscultation, symmetrical breath sounds, no wheezes, no rhonchi   CV:   Irregular rate and rhythm, grade III/VI crescendo/decrescendo murmur heard best at LSB, + diastolic murmur  Abdomen:  soft, non-tender, no masses   Extremities:  warm, well-perfused, pulses diminished, mild LE edema  Rectal/GU  Deferred  Neuro:   Grossly non-focal and symmetrical throughout  Skin:   Clean and  dry, no rashes, no breakdown   Diagnostic Tests:  Transthoracic Echocardiography  Patient:  [name], [name] MR #:    [id number] Study Date: 03/01/2011 Gender:   M Age:    35 Height:   177.8cm Weight:   81.6kg BSA:     36m^2 Pt. Status: Room:  ATTENDING  Dola Argyle, MD, [place], MD Berk[place], MD Faylene Million, Scott REFERRING  Kathlen Mody, [place], [facility] SONOGRAPHER Barbra Sarks, RDCS cc:  ------------------------------------------------------------ LV EF: 60%  ------------------------------------------------------------ Indications:   Prosthetic valve - aortic V43.3.  ------------------------------------------------------------ History:  PMH: Acquired from the patient and from the patient's chart. Bilateral lower extremity edema. Atrial fibrillation. Congestive heart failure. Aortic stenosis, post prosthetic replacement, porcine aortic valve, January 23, 2011. Stroke. Risk factors: Former tobacco use. Hypertension. Diabetes mellitus.  ------------------------------------------------------------ Study Conclusions  - Left ventricle: The cavity size was normal. Wall thickness was increased in a pattern of moderate LVH. The estimated ejection fraction was 60%. Wall motion was normal; there were no regional wall motion abnormalities. Doppler parameters are consistent with high ventricular filling pressure. - Aortic valve: There is a prosthetic valve in the aortic postion. There is no AS. There is an AI signal that is vey eccentric. This may be a perivalvular leak. - Left atrium: The atrium was moderately dilated. - Right ventricle: The cavity size was mildly dilated. - Right atrium: The atrium was moderately dilated. - Tricuspid valve: Mild-moderate regurgitation. - Pulmonary arteries: PA peak pressure: 36mm Hg (S). - Impressions: There is a very unusual color signal seen in the PA in the short axis view. The signal appears to have continuous flow. it is seen in the outside wall of the PA (across from where the PA and aorta touch each other. This signal was not present on the  pre-op study. Etiology is not clear. I doubt it is the eccentric AI signal. Impressions:  - There is a very unusual color signal seen in the PA in the short axis view. The signal appears to have continuous flow. it is seen in the outside wall of the PA (across from where the PA and aorta touch each other. This signal was not present on the pre-op study. Etiology is not clear. I doubt it is the eccentric AI signal. Transthoracic echocardiography. M-mode, complete 2D, spectral Doppler, and color Doppler. Height: Height: 177.8cm. Height: 70in. Weight: Weight: 81.6kg. Weight: 179.6lb. Body mass index: BMI: 25.8kg/m^2. Body surface area:  BSA: 61m^2. Blood pressure:   140/80. Patient status: Outpatient. Location: [facility]  ------------------------------------------------------------  ------------------------------------------------------------ Left ventricle: The cavity size was normal. Wall thickness was increased in a pattern of moderate LVH. The estimated ejection fraction was 60%. Wall motion was normal; there were no regional wall motion abnormalities. Doppler parameters are consistent with high ventricular filling pressure.  ------------------------------------------------------------ Aortic valve: There is a prosthetic valve in the aortic postion. There is no AS. There is an AI signal that is vey eccentric. This may be a perivalvular leak. Doppler: VTI ratio of LVOT to aortic valve: 0.55. Indexed valve area: 1.05cm^2/m^2 (VTI). Peak velocity ratio of LVOT to aortic valve: 0.49. Indexed valve area: 0.93cm^2/m^2 (Vmax). Mean gradient: 64mm Hg (S). Peak gradient: 49mm Hg (S).  ------------------------------------------------------------ Aorta: Aortic root: The aortic root was normal in size.  ------------------------------------------------------------ Mitral valve:  Structurally normal valve.  Leaflet separation was normal. Doppler:  Transvalvular velocity was within the normal range. There was no  evidence for stenosis. No regurgitation.  Peak gradient: 75mm Hg (D).  ------------------------------------------------------------ Left atrium: The atrium was moderately dilated.  ------------------------------------------------------------ Right ventricle: The cavity size was mildly dilated. Systolic function was normal.  ------------------------------------------------------------ Pulmonic valve:  The valve appears to be grossly normal. Doppler:  No significant regurgitation.  ------------------------------------------------------------ Tricuspid valve:  The valve appears to be grossly normal. Doppler:  Mild-moderate regurgitation.  ------------------------------------------------------------ Right atrium: The atrium was moderately dilated.  ------------------------------------------------------------ Pericardium: There was no pericardial effusion.  ------------------------------------------------------------  2D measurements    Normal Doppler measurements  Norma Left ventricle                     l LVID ED,  40.2 mm   43-52  Main pulmonary artery chord,             Pressure,  42 mm Hg  =30 PLAX              S LVID ES,  31.3 mm   23-38  Left ventricle chord,             Ea, lat  10. cm/s   ----- PLAX              ann, tiss  5 FS, chord,  22 %   >29   DP PLAX              E/Ea, lat 11.     ----- LVPW, ED  16.1 mm   ------ ann, tiss  52 IVS/LVPW  1.08    <1.3  DP ratio, ED           Ea, med  4.8 cm/s   ----- Ventricular septum       ann, tiss  7 IVS, ED  17.4 mm   ------ DP LVOT              E/Ea, med 24.     ----- Diam, S   22 mm   ------ ann, tiss  85 Area    3.8 cm^2  ------ DP Diam     22 mm    ------ LVOT Aorta             Peak vel, 133 cm/s   ----- Root diam,  32 mm   ------ S ED               VTI, S   31. cm    ----- Left atrium                 2 AP dim    50 mm   ------ Peak     7 mm Hg  ----- AP dim   2.5 cm/m^2 <2.2  gradient, index             S                HR     65 bpm   -----                Stroke vol 118 ml    -----                      .6                Cardiac  7.7 L/min  -----                output  Cardiac  3.9 L/(min-m -----                index     ^2)                Stroke   59. ml/m^2  -----                index    3                Aortic valve                Peak vel, 273 cm/s   -----                S                Mean vel, 174 cm/s   -----                S                VTI, S   56. cm    -----                      4                Mean    15 mm Hg  -----                gradient,                S                Peak    30 mm Hg  -----                gradient,                S                VTI ratio 0.5     -----                LVOT/AV   5                Area index 1.0 cm^2/m^2 -----                (VTI)    5                Peak vel  0.4     -----                ratio,    9                LVOT/AV                Area index 0.9 cm^2/m^2 -----                (Vmax)     3                Regurg PHT 517 ms    -----                Mitral valve                Peak E vel 121 cm/s   -----                Peak A vel 87. cm/s   -----  4                Decelerati 194 ms    150-2                on time         30                Peak     6 mm Hg  -----                gradient,                D                Peak E/A  1.4     -----                ratio                Tricuspid valve                Regurg   305 cm/s   -----                peak vel                Peak RV-RA 37 mm Hg  -----                gradient,                S                Systemic veins                Estimated  5 mm Hg  -----                CVP                Right ventricle                Pressure,  42 mm Hg  <30                S                Sa vel,  11. cm/s   -----                lat ann,   4                tiss DP                Pulmonic valve                Peak vel, 50. cm/s   -----                S      3  ------------------------------------------------------------ Prepared and Electronically Authenticated by  Dola Argyle, MD, [facility] [id number]      Transesophageal Echocardiography  Patient:  [name], [name] MR #:    [id number] Study Date: 12/15/2012 Gender:   M Age:    43 Height: Weight: BSA: Pt. Status: Room:    5W25C  Venida Jarvis Arguello,  Roger ADMITTING Patel, Pranav ATTENDING Coralyn Pear, Ezequiel cc:  ------------------------------------------------------------ LV EF: 60% -  65%  ------------------------------------------------------------ Study Conclusions  - Left ventricle: Systolic function was normal. The estimated ejection fraction was in the range of 60% to 65%. - Aortic valve: A bioprosthesis was present. No evidence of  vegetation. - Aorta: The visualized portion of the ascending, descending aorta and the aortic archwere not dilated, and severely diseased, no mobile plague was seen. - Mitral valve: Mildly calcified annulus. Moderately thickened, mildly calcified leaflets . No evidence of vegetation. Mild to moderate regurgitation directed centrally. - Left atrium: No evidence of thrombus in the atrial cavity or appendage. No evidence of thrombus in the appendage. - Right atrium: No evidence of thrombus in the atrial cavity or appendage. No evidence of thrombus in the appendage. - Atrial septum: No defect or patent foramen ovale was identified. Echo contrast study showed no right-to-left atrial level shunt, following an increase in RA pressure induced by provocative maneuvers. Echo contrast study showed no right-to-left atrial level shunt with Doppler. - Tricuspid valve: No evidence of vegetation. - Pulmonic valve: No evidence of vegetation. Impressions:  - No evidence of endocarditis. Vegetation is absent. Transesophageal echocardiography. 2D and color Doppler.  ------------------------------------------------------------  ------------------------------------------------------------ Left ventricle: Systolic function was normal. The estimated ejection fraction was in the range of 60% to 65%.  ------------------------------------------------------------ Aortic valve: A bioprosthesis was present. Cusp separation was normal. No evidence of vegetation. Doppler:   No regurgitation. No significant perivalvular regurgitation.  ------------------------------------------------------------ Aorta: The visualized portion of the ascending, descending aorta and the aortic arch were not dilated, and severely diseased, no mobile plague was seen.  ------------------------------------------------------------ Mitral valve:  Mildly calcified annulus. Moderately thickened, mildly calcified leaflets . No evidence of vegetation. Doppler:  Mild to moderate regurgitation directed centrally.  ------------------------------------------------------------ Left atrium:  No evidence of thrombus in the atrial cavity or appendage. No evidence of thrombus in the appendage. The appendage was of normal size. Emptying velocity was normal.  ------------------------------------------------------------ Atrial septum: No defect or patent foramen ovale was identified. Echo contrast study showed no right-to-left atrial level shunt, following an increase in RA pressure induced by provocative maneuvers. Echo contrast study showed no right-to-left atrial level shunt with Doppler.  ------------------------------------------------------------ Right ventricle: The cavity size was normal. Wall thickness was normal. Systolic function was normal.  ------------------------------------------------------------ Pulmonic valve:  Poorly visualized. No evidence of vegetation. Doppler:  No regurgitation.  ------------------------------------------------------------ Tricuspid valve:  Structurally normal valve.  Leaflet separation was normal. No evidence of vegetation. Doppler: No regurgitation.  ------------------------------------------------------------ Right atrium: The atrium was normal in size. No evidence of thrombus in the atrial cavity or appendage. No evidence of thrombus in the appendage. The appendage was  normal sized.  ------------------------------------------------------------ Post procedure conclusions Ascending Aorta:  - The visualized portion of the ascending, descending aorta and the aortic arch were not dilated, and severely diseased, no mobile plague was seen.  ------------------------------------------------------------ Prepared and Electronically Authenticated by  Ena Dawley, M.D. 2014-12-08T16:25:25.803     Transthoracic Echocardiography  Patient:  [name], [name] MR #:    [id number] Study Date: 03/11/2014 Gender:   M Age:    57 Height:   177.8 cm Weight:   85.3 kg BSA:    2.07 m^2 Pt. Status: Room:  ATTENDING  [name] ORDERING   [name] REFERRING  [name] SONOGRAPHER Cindy Hazy, RDCS PERFORMING  [facility], Outpatient  cc:  -------------------------------------------------------------------  ------------------------------------------------------------------- Indications:   I35.9 Aortic Valve Disorder.  ------------------------------------------------------------------- History:  PMH: Acquired from the patient and from the patient&'s chart. PMH: Renal Insufficiency. Chronic Kidney Disease. Bacteremia. Paroxysmal Atrial Fibrillation. Sinus Bradycardia. CAD. Aortic Stenosis. CVA. Fatigue. Hypothyroidism. Degenerative Disc Disease.  ------------------------------------------------------------------- Study Conclusions  - Left ventricle: The cavity size was normal. There was moderate concentric hypertrophy. Wall motion was  normal; there were no regional wall motion abnormalities. Doppler parameters are consistent with pseduonormal left ventricular relaxation (grade 2 diastolic dysfunction). The E/A ratio is 2.3. The E/e&' ratio is >20, suggesting markedly elevated LV filling pressure. - Aortic valve: Trileaflet bioprosthetic valve with significant degeneration; calcified  leaflets with a small amount of echogenic material seen prolapsing into the LVOT with valve closure. Severe aortic stenosis - peak and mean gradients of 61 and 41 mmHg. There is moderate AI. Based on a measured LVOT diameter of 2.0 cm, the indexed AVA is 0.6 cm2/m2. The calculated AVA by continuity is higher, however, this neglects the moderate amount of aortic insufficiency. Valve area (VTI): 1.28 cm^2. Valve area (Vmean): 1.18 cm^2. - Mitral valve: Calcified leaflets with mild restriction. Mild regurgitation. Posterior calcified annulus. - Left atrium: Severely dilated at 66 ml/m2. - Right atrium: The atrium was mildly dilated. - Tricuspid valve: There was moderate regurgitation. - Pulmonary arteries: PA peak pressure: 51 mm Hg (S). - Inferior vena cava: The vessel was normal in size. The respirophasic diameter changes were in the normal range (>= 50%), consistent with normal central venous pressure.  Impressions:  - Compared with the prior study in 09/2013, there is clearly severe bioprosthetic aortic valve stenosis. There is also moderate insufficiency and what appears to be calclified material that prolapses into the LVOT on aortic valve closure. Biventricular filling pressures are very high.  ------------------------------------------------------------------- Labs, prior tests, procedures, and surgery: Status post Aortic Valve Replacement.     Transthoracic echocardiography. M-mode, complete 2D, spectral Doppler, and color Doppler. Birthdate: Patient birthdate: 05-29-1922. Age: Patient is 81 yr old. Sex: Gender: male.  BMI: 27 kg/m^2. Blood pressure:   138/54 Patient status: Outpatient. Study date: Study date: 03/11/2014. Study time: 09:11 AM. Location: [facility]  -------------------------------------------------------------------  ------------------------------------------------------------------- Left ventricle:  The cavity size was normal. There was moderate concentric hypertrophy. Wall motion was normal; there were no regional wall motion abnormalities. Doppler parameters are consistent with pseduonormal left ventricular relaxation (grade 2 diastolic dysfunction). The E/A ratio is 2.3. The E/e&' ratio is >20, suggesting markedly elevated LV filling pressure.  ------------------------------------------------------------------- Aortic valve: Trileaflet bioprosthetic valve with significant degeneration; calcified leaflets with a small amount of echogenic material seen prolapsing into the LVOT with valve closure. Severe aortic stenosis - peak and mean gradients of 61 and 41 mmHg. There is moderate AI. Based on a measured LVOT diameter of 2.0 cm, the indexed AVA is 0.6 cm2/m2. The calculated AVA by continuity is higher, however, this neglects the moderate amount of aortic insufficiency. Doppler:   VTI ratio of LVOT to aortic valve: 0.41. Valve area (VTI): 1.28 cm^2. Indexed valve area (VTI): 0.62 cm^2/m^2. Mean velocity ratio of LVOT to aortic valve: 0.38. Valve area (Vmean): 1.18 cm^2. Indexed valve area (Vmean): 0.57 cm^2/m^2.  Mean gradient (S): 40 mm Hg. Peak gradient (S): 71 mm Hg.  ------------------------------------------------------------------- Aorta: The aorta was mildly calcified. Aortic root: The aortic root was normal in size. Ascending aorta: The ascending aorta was normal in size.  ------------------------------------------------------------------- Mitral valve: Calcified leaflets with mild restriction. Mild regurgitation. Posterior calcified annulus. Doppler:   Peak gradient (D): 12 mm Hg.  ------------------------------------------------------------------- Left atrium: Severely dilated at 66 ml/m2.  ------------------------------------------------------------------- Right ventricle: The cavity size was normal. Wall thickness was normal. Systolic function was  normal.  ------------------------------------------------------------------- Pulmonic valve:  The valve appears to be grossly normal. Doppler: There was no significant regurgitation.  ------------------------------------------------------------------- Tricuspid valve:  Doppler: There was moderate regurgitation.  -------------------------------------------------------------------  Pulmonary artery:  The main pulmonary artery was normal-sized.  ------------------------------------------------------------------- Right atrium: The atrium was mildly dilated.  ------------------------------------------------------------------- Pericardium: There was no pericardial effusion.  ------------------------------------------------------------------- Systemic veins: Inferior vena cava: The vessel was normal in size. The respirophasic diameter changes were in the normal range (>= 50%), consistent with normal central venous pressure.  ------------------------------------------------------------------- Post procedure conclusions Ascending Aorta:  - The aorta was mildly calcified.  ------------------------------------------------------------------- Measurements  Left ventricle              Value     Reference LV ID, ED, PLAX chordal          50.8 mm    43 - 52 LV ID, ES, PLAX chordal          28.8 mm    23 - 38 LV fx shortening, PLAX chordal      43  %    >=29 LV PW thickness, ED            13.2 mm    --------- IVS/LV PW ratio, ED            1.06      <=1.3 LV e&', lateral              10.7 cm/s   --------- LV E/e&', lateral             16.45     --------- LV e&', medial               5.26 cm/s   --------- LV E/e&', medial              33.46     --------- LV e&', average              7.98 cm/s    --------- LV E/e&', average             22.06     ---------  Ventricular septum            Value     Reference IVS thickness, ED             14  mm    ---------  LVOT                   Value     Reference LVOT ID, S                20  mm    --------- LVOT area                 3.14 cm^2   --------- LVOT peak velocity, S           158  cm/s   --------- LVOT mean velocity, S           114  cm/s   --------- LVOT VTI, S                42.8 cm    --------- LVOT peak gradient, S           10  mm Hg  --------- Stroke volume (SV), LVOT DP        134.5 ml    --------- Stroke index (SV/bsa), LVOT DP      65.1 ml/m^2  ---------  Aortic valve               Value     Reference Aortic valve mean velocity, S       303  cm/s   ---------  Aortic valve VTI, S            105  cm    --------- Aortic mean gradient, S          40  mm Hg  --------- Aortic peak gradient, S          71  mm Hg  --------- VTI ratio, LVOT/AV            0.41      --------- Aortic valve area, VTI          1.28 cm^2   --------- Aortic valve area/bsa, VTI        0.62 cm^2/m^2 --------- Velocity ratio, mean, LVOT/AV       0.38      --------- Aortic valve area, mean velocity     1.18 cm^2   --------- Aortic valve area/bsa, mean        0.57 cm^2/m^2 --------- velocity Aortic regurg pressure half-time     448  ms    ---------  Aorta                   Value     Reference Aortic root ID, ED            33  mm    ---------  Left atrium                Value     Reference LA ID, A-P, ES              57   mm    --------- LA ID/bsa, A-P          (H)   2.76 cm/m^2  <=2.2 LA volume, S               136  ml    --------- LA volume/bsa, S             65.8 ml/m^2  --------- LA volume, ES, 1-p A4C          133  ml    --------- LA volume/bsa, ES, 1-p A4C        64.4 ml/m^2  --------- LA volume, ES, 1-p A2C          130  ml    --------- LA volume/bsa, ES, 1-p A2C        62.9 ml/m^2  ---------  Mitral valve               Value     Reference Mitral E-wave peak velocity        176  cm/s   --------- Mitral A-wave peak velocity        75.4 cm/s   --------- Mitral deceleration time     (H)   317  ms    150 - 230 Mitral peak gradient, D          12  mm Hg  --------- Mitral E/A ratio, peak          2.3      ---------  Pulmonary arteries            Value     Reference PA pressure, S, DP        (H)   51  mm Hg  <=30  Tricuspid valve              Value     Reference Tricuspid regurg peak velocity      341  cm/s   --------- Tricuspid peak  RV-RA gradient       47  mm Hg  ---------  Right ventricle              Value     Reference RV s&', lateral, S             17.8 cm/s   ---------  Legend: (L) and (H) mark values outside specified reference range.  ------------------------------------------------------------------- Prepared and Electronically Authenticated by  Lyman Bishop MD 2016-03-03T15:00:33   TRANSESOPHAGEAL ECHOCARDIOGRAM  Normal EF 60% Tissue AVR- severe AR Will have to review with partners but I believe it is mostly from perforation at base of right leaflet and not perivalvular Mild MR Normal RV No PFO No aortic debris No pericardial effusion  See full report in [employer]  [facility]   Cardiac Catheterization Operative Report  [name] [id number] 3/29/20162:42 PM [name], MD  Procedure Performed:  1. Selective Coronary Angiography 2. Right Heart Catheterization  Operator: Lauree Chandler, MD  Indication: 81 yo male with history of severe aortic valve stenosis/insufficiency secondary to malfunction bioprosthetic AVR, thrombocytopenia, chronic diastolic CHF here today for cardiac cath. Recent fatigue and dyspnea leading to echocardiogram which showed severe AS and moderate AI of the bioprosthetic aortic valve. This was confirmed by TEE which showed severe AI felt to be due to a perforated leaflet. Workup is currently underway for TAVR.   Procedure Details: The risks, benefits, complications, treatment options, and expected outcomes were discussed with the patient. The patient and/or family concurred with the proposed plan, giving informed consent. The patient was brought to the cath lab after IV hydration was begun and oral premedication was given. The patient was further sedated with Versed. We explored the option of left antecubital vein access for the right heart cath but he had no good venous access points and his arm was bruised from prior sticks on the floor. I elected to place a sheath in the right femoral artery for the right heart cath and the left radial artery for the coronary angiography. The right groin was prepped and draped in the usual manner. Using the modified Seldinger access technique, a 7 French sheath was placed in the right femoral vein. A balloon tipped catheter was used to perform a right heart catheterization. The left wrist was prepped and draped in a sterile fashion. 1% lidocaine was used for local anesthesia. A 5/6 French slender sheath was placed in the left radial artery using the modified Seldinger technique. Standard diagnostic catheters were used to perform selective coronary angiography. The  bioprosthetic aortic valve was not crossed. There were no immediate complications. Terumo hemostasis band applied to the left wrist. The patient was taken to the recovery area in stable condition.   Hemodynamic Findings: Ao: 146/30  LV: Aortic valve crossed RA: 5  RV: 42/3/9 PA: 42/13 (mean 25)  PCWP: 18 Fick Cardiac Output: 7.19 L/min Fick Cardiac Index: 3.6 L/min/m2 Central Aortic Saturation: 96% Pulmonary Artery Saturation: 64%  Angiographic Findings:  Left main: No obstructive disease.   Left Anterior Descending Artery: Large caliber vessel that courses to the apex. The proximal vessel is heavily calcified with diffuse 20% stenosis. The mid vessel is calcified with diffuse 20% stenosis. The distal vessel has mild plaque disease. There is a moderate caliber diagonal branch with no obstructive disease.   Circumflex Artery: Large caliber vessel with moderate caliber first obtuse marginal branch and large caliber second obtuse marginal branch. No obstructive disease. There is calcification noted in throughout the obtuse  marginal branches and the AV groove Circumflex.   Right Coronary Artery: Large dominant vessel with calcification noted in the mid vessel. The proximal vessel has 20% stenosis. The mid vessel has a focal calcified 40% stenosis. The distal vessel has diffuse 30% stenosis.   Left Ventricular Angiogram: Deferred.   Impression: 1. Mild non-obstructive CAD 2. Mild elevation filling pressures 3. Known failure of bioprosthetic aortic valve with severe AI/AS.   Recommendations: Will gently hydrate tonight and recheck BMET in am. If he is stable tomorrow, may be able to go home. Further planning for TAVR as an outpatient.    Complications: None; patient tolerated the procedure well.      Cardiac TAVR CT  TECHNIQUE: The patient was scanned on a Philips 256 scanner. A 120 kV retrospective scan was triggered in the  descending thoracic aorta at 111 HU's. Gantry rotation speed was 270 msecs and collimation was .9 mm. No beta blockade or nitro were given. The 3D data set was reconstructed in 5% intervals of the R-R cycle. Systolic and diastolic phases were analyzed on a dedicated work station using MPR, MIP and VRT modes. The patient received 80 cc of contrast.  FINDINGS: Aortic Valve: A bioprosthetic valve is present in the aortic position. The minimal internal diameter is 20 x 20 mm. There is minimal subvalvular calcification. Leaflets appears thickened.  Aorta: Normal caliber, minimal diffuse calcifications, no dissection.  Sinotubular Junction: 33 x 32 mm  Ascending Thoracic Aorta: 37 x 37 mm  Aortic Arch: not visualized  Descending Thoracic Aorta: 32 x 31 mm  Coronary Artery distance from bioprosthetic valvular struts:  Left Main: 8 mm  Right Coronary: 10 mm  Measurements at the level of bioprosthetic valvular struts (at the level of coronary origin):  Maximum/Minimum Diameter: 23.6 x 23.6 mm  Area: 465 mm2  Virtual Basal Annulus Measurements:  Maximum/Minimum Diameter: 22 x 20 mm  Perimeter: 75 mm  Area: 300 mm2  Coronary Arteries: Normal origin, right dominance. Diffuse calcifications, predominantly in proximal and mid LAD. The study performed without use of NTG and inadequate for coronary evaluation.  Optimum Fluoroscopic Angle for Delivery: LAO 6 CRA 6  IMPRESSION: 1. A 25 mm Mosaic Ultra porcine valve with degenerative changes and annular measurements suitable for delivery of 20 mm Edward-SAPIEN 3 valve.  2. There is sufficient distance between struts and coronary arteries.  3. Optimum Fluoroscopic Angle for Delivery: LAO 6 CRA 6  Ena Dawley   Electronically Signed  By: Ena Dawley  On: 04/21/2014 17:32      Study Result     EXAM: OVER-READ INTERPRETATION CT CHEST  The following report is an  over-read performed by radiologist Dr. Rosine Beat [facility] on 04/16/2014. This over-read does not include interpretation of cardiac or coronary anatomy or pathology. The coronary calcium score/coronary CTA interpretation by the cardiologist is attached.  COMPARISON: CT chest from 01/12/2011.  FINDINGS: There is no evidence for mediastinal lymphadenopathy within the visualized portions of the mediastinum. 11 mm short axis right hilar lymph node is mildly enlarged. Calcified nodal tissue is seen in the left hilum. The heart is enlarged. No pericardial effusion.  Lung windows show interlobular septal thickening in the bases suggesting pulmonary edema. Dependent atelectasis is noted in the lower lobes bilaterally.  Images which include portions of the upper abdomen demonstrate perihepatic and perisplenic ascites. Although incompletely visualized, the spleen appears enlarged.  IMPRESSION: 1. Imaging features suggest interstitial edema with dependent atelectasis in the lower lobes bilaterally. 2.  Borderline right hilar lymphadenopathy. 3. Abdominal ascites with probable splenomegaly. Irregular liver contour raises the question of underlying cirrhosis.  Electronically Signed: By: Misty Stanley M.D. On: 04/16/2014 11:16        CT ANGIOGRAPHY CHEST, ABDOMEN AND PELVIS  TECHNIQUE: Multidetector CT imaging through the chest, abdomen and pelvis was performed using the standard protocol during bolus administration of intravenous contrast. Multiplanar reconstructed images and MIPs were obtained and reviewed to evaluate the vascular anatomy.  CONTRAST: 2mL OMNIPAQUE IOHEXOL 350 MG/ML SOLN  COMPARISON: CT of the abdomen and pelvis 02/09/2013. Chest CT 01/12/2011.  FINDINGS: CTA CHEST FINDINGS  Mediastinum/Lymph Nodes: Heart size is mildly enlarged. There is no significant pericardial fluid, thickening or pericardial calcification. There is  atherosclerosis of the thoracic aorta, the great vessels of the mediastinum and the coronary arteries, including calcified atherosclerotic plaque in the left main, left anterior descending, left circumflex and right coronary arteries. Status post median sternotomy for aortic valve replacement with a stented bioprosthesis which appears thickened and partially calcified. There are multiple heavily calcified mediastinal and hilar lymph nodes (left greater than right), some of which are enlarged, measuring up to 2 cm in the left hilar region) these enlarged lymph nodes are heavily calcified and considered benign). No enlarged noncalcified lymph nodes are noted. Esophagus is unremarkable in appearance. No axillary lymphadenopathy.  Lungs/Pleura: Several calcified granulomas are noted throughout the lungs bilaterally. 7 mm noncalcified subpleural pulmonary nodule in the right lower lobe (image 89 of series 5). No acute consolidative airspace disease. No pleural effusions. Mild centrilobular and paraseptal emphysema. Mild diffuse bronchial wall thickening.  Musculoskeletal/Soft Tissues: Multiple old healed left-sided rib fractures. Upper right sided sternotomy wires.  CTA ABDOMEN AND PELVIS FINDINGS  Hepatobiliary: The liver has a shrunken appearance and nodular contour, compatible with underlying cirrhosis. No definite hypervascular lesion identified at this time to indicate hepatocellular carcinoma. Status post cholecystectomy. No intra or extrahepatic biliary ductal dilatation. Portal vein is dilated measuring 18 mm in the porta hepatis.  Pancreas: Unremarkable.  Spleen: Massively enlarged spleen measuring 20.1 x 8.9 x 23.7 cm (estimated splenic volume of 2,120 mL). Multiple calcified granulomas in the spleen.  Adrenals/Urinary Tract: Kidneys are severely atrophic bilaterally with marked cortical thinning and multiple low-attenuation lesions throughout the kidneys  bilaterally, compatible with simple cysts, largest of which is in the lower pole of the right kidney measuring 5.4 cm. In addition, in the lower pole of the left kidney there is an exophytic 5.8 cm low-attenuation lesion which has some thin peripheral calcifications in the wall, compatible with a Bosniak class 2 cyst. No hydroureteronephrosis. Urinary bladder is normal in appearance. Right adrenal gland is normal in appearance. 1.9 x 1.4 cm nodule in the lateral limb of the left adrenal gland is indeterminate.  Stomach/Bowel: The appearance of the stomach is normal. No pathologic dilatation of small bowel or colon. Marked circumferential thickening of the distal rectum immediately above the anal verge. Numerous colonic diverticulae are noted, without definite surrounding inflammatory changes to suggest an acute diverticulitis at this time.  Vascular/Lymphatic: Vascular findings and measurements pertinent to potential TAVR procedure, as detailed below. No lymphadenopathy noted in the abdomen or pelvis. Dilated portal vein, as above. Markedly dilated splenic vein.  Reproductive: Prostate gland and seminal vesicles are unremarkable in appearance.  Other: Small right inguinal hernia containing some fat and ascites. Small volume of ascites. No pneumoperitoneum.  Musculoskeletal: Well-defined sclerotic lesion with narrow zone of transition measuring 13 mm in the right side of the L5  vertebral body posteriorly, similar to the prior examination, likely a bone island. There are no aggressive appearing lytic or blastic lesions noted in the visualized portions of the skeleton.  VASCULAR MEASUREMENTS PERTINENT TO TAVR:  AORTA:  Minimal Aortic Diameter - 18 x 20 mm  Severity of Aortic Calcification - moderate  RIGHT PELVIS:  Right Common Iliac Artery -  Minimal Diameter - 13.1 x 12.7 mm  Tortuosity - moderate  Calcification - mild  Right External Iliac Artery  -  Minimal Diameter - 10.2 x 9.5 mm  Tortuosity - moderate  Calcification - none  Right Common Femoral Artery -  Minimal Diameter - 9.9 x 10.1 mm  Tortuosity - mild  Calcification - none  LEFT PELVIS:  Left Common Iliac Artery -  Minimal Diameter - 11.6 x 11.4 mm  Tortuosity - moderate  Calcification - moderate  Left External Iliac Artery -  Minimal Diameter - 8.5 x 9.6 mm  Tortuosity - moderate  Calcification - none  Left Common Femoral Artery -  Minimal Diameter - 11.4 x 10.4 mm  Tortuosity - mild  Calcification - mild  Review of the MIP images confirms the above findings.  IMPRESSION: 1. Vascular findings and measurements pertinent to potential TAVR procedure, as detailed above. This patient does appear to have suitable pelvic arterial access for the procedure. 2. Stigmata of cirrhosis with portal hypertension, as above, including massive splenomegaly. 3. Small volume of ascites. 4. Severe circumferential thickening of the distal rectum immediately above the anal verge. While this could simply reflect a proctitis, correlation with physical examination and sigmoidoscopy is suggested in the near future to exclude the possibility of a distal rectal neoplasm. 5. 7 mm noncalcified subpleural nodule in the right lower lobe. Given the patient's advanced age, and the presence of multiple calcified granulomas, this may be a benign area of scarring. If of clinical concern, repeat noncontrast chest CT could be performed in 6-12 months to assess for stability. 6. Colonic diverticulosis without findings to suggest acute diverticulitis at this time. 57. Sequela of old granulomatous disease, as above. 8. Severe renal atrophy with multiple cysts in the kidneys bilaterally, the majority of which are simple cysts. In addition, there is a 5.7 cm Bosniak class 2 cyst in the lower pole of the left kidney (and calcifications in the wall). 9.  Additional incidental findings, as above.   Electronically Signed  By: Vinnie Langton M.D.  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  He presents with progressive symptoms of worsening fatigue and exertional shortness of breath consistent with chronic diastolic congestive heart failure, New York Heart Association functional class III. I have personally reviewed the patient's numerous echocardiograms, diagnostic cardiac catheterization, and CT angiograms.  There is severe structural deterioration of the bioprosthetic tissue valve in aortic position with severe aortic stenosis and severe aortic insufficiency.  It is difficult to tell with 100% confidence whether or not there may be a component of paravalvular leak, but for the most part the aortic insufficiency seems  to be related to valvular leaflet deterioration and possible leaflet perforation.  However, the patient clearly had significant aortic insufficiency on his initial routine post-operative echo performed in February of 2013, and at that time the aortic insufficienctly definitely looked like a paravalvular leak.  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   Endocarditis may have caused damage to the leaflets which has subsequently progressed over time.  There are no signs to suggest the presence of active endocarditis at this time, although follow up blood cultures have not been done.   Diagnostic cardiac catheterization is notable for the absence of significant coronary artery disease.  I agree that redo aortic valve replacement using conventional surgery would be associated with prohibitive risk in this elderly gentleman with numerous morbid medical problems.  I would not consider this patient a candidate for redo open surgery under any circumstances.  CT angiography reveals findings suggestive that valve-in-valve transcatheter aortic valve replacement might be feasible as an alternative treatment of prosthetic valve dysfunction through a transfemoral approach.  However, valve-in-valve TAVR might lead to additional complications or problems if the patient has significant paravalvular leak and/or persistent prosthetic valve endocarditis.   Plan:  The patient and his family were counseled at length regarding treatment alternatives for management of severe symptomatic aortic stenosis. Alternative approaches such as redo conventional surgical aortic valve replacement, valve-in-valve transcatheter aortic valve replacement, and palliative medical therapy were compared and contrasted at length.  The risks associated with conventional surgical aortic valve replacement were been discussed in detail, as were reasons why the patient would likely do poorly with redo open surgery.  Long-term prognosis with medical therapy was discussed. This discussion was placed in the context of the patient's own specific clinical presentation and past medical history.  All of their questions been addressed.  The patient is interested in proceeding with TAVR in the near future and has tentatively been scheduled for surgery on Friday 05/14/2014.  Between now and then the patient's recent TEE and other diagnostic tests will be reviewed carefully by a multidisciplinary team of specialists to make certain that we do not feel the patient has a large paravalvular leak and/or prosthetic valve endocarditis.  Following the  decision to proceed with transcatheter aortic valve replacement, a discussion has been held regarding what types of management strategies would be attempted intraoperatively in the event of life-threatening complications, including whether or not the patient would be considered a candidate for the use of cardiopulmonary bypass and/or conversion to open sternotomy for attempted surgical intervention.  The patient has been advised of a variety of complications that might develop including but not limited to risks of death, stroke, paravalvular leak, aortic dissection or other major vascular complications, aortic annulus rupture, device embolization, cardiac rupture or perforation, mitral regurgitation, acute myocardial infarction, arrhythmia, heart block or bradycardia requiring permanent pacemaker placement, congestive heart failure, respiratory failure, renal failure, pneumonia, infection, other late complications related to structural valve deterioration or migration, or other complications that might ultimately cause a temporary or permanent loss of functional independence or other long term morbidity.  The patient provides full informed consent for the procedure as described and all questions were answered.   I spent in excess of 90 minutes during the conduct of this office consultation and >50% of this time involved direct face-to-face encounter with the patient for counseling and/or coordination of their care.  Valentina Gu. Roxy Manns, MD 04/26/2014 5:55 PM

## 2014-04-27 LAB — BASIC METABOLIC PANEL
BUN: 53 mg/dL — AB (ref 6–23)
CO2: 22 meq/L (ref 19–32)
Calcium: 8.8 mg/dL (ref 8.4–10.5)
Chloride: 111 mEq/L (ref 96–112)
Creat: 2.5 mg/dL — ABNORMAL HIGH (ref 0.50–1.35)
GLUCOSE: 89 mg/dL (ref 70–99)
Potassium: 4.3 mEq/L (ref 3.5–5.3)
SODIUM: 144 meq/L (ref 135–145)

## 2014-04-27 LAB — ESTIMATED GFR
GFR, EST NON AFRICAN AMERICAN: 22 mL/min — AB
GFR, Est African American: 25 mL/min — ABNORMAL LOW

## 2014-04-27 LAB — CREATININE, SERUM: Creat: 2.52 mg/dL — ABNORMAL HIGH (ref 0.50–1.35)

## 2014-04-28 ENCOUNTER — Telehealth: Payer: Self-pay | Admitting: Internal Medicine

## 2014-04-28 ENCOUNTER — Telehealth: Payer: Self-pay | Admitting: Cardiovascular Disease

## 2014-04-28 LAB — ESTIMATED GFR
GFR, Est African American: 25 mL/min — ABNORMAL LOW
GFR, Est Non African American: 22 mL/min — ABNORMAL LOW

## 2014-04-28 LAB — CREATININE, SERUM: Creat: 2.5 mg/dL — ABNORMAL HIGH (ref 0.50–1.35)

## 2014-04-28 NOTE — Telephone Encounter (Signed)
Rec to patient's wife that they contact Dr. Linna Darner (PCP) for referral that she was informed she needs for visit to ortho for injection.

## 2014-04-28 NOTE — Telephone Encounter (Signed)
New Message      Pt's wife calling stating that the pt got a shot for pain in his shoulder yesterday and due to pt's insurance pt needs a referral from Dr. Burt Knack. Please call back and advise.

## 2014-04-28 NOTE — Telephone Encounter (Signed)
Patient called requesting a [employer] referral to be backed dated to [place].  Spoke with referral coordinators and relayed to him that [facility] will no longer back date a referral.

## 2014-04-30 ENCOUNTER — Telehealth: Payer: Self-pay | Admitting: *Deleted

## 2014-04-30 ENCOUNTER — Ambulatory Visit (HOSPITAL_BASED_OUTPATIENT_CLINIC_OR_DEPARTMENT_OTHER): Payer: Commercial Managed Care - HMO | Admitting: Nurse Practitioner

## 2014-04-30 ENCOUNTER — Other Ambulatory Visit (HOSPITAL_BASED_OUTPATIENT_CLINIC_OR_DEPARTMENT_OTHER): Payer: Commercial Managed Care - HMO

## 2014-04-30 ENCOUNTER — Telehealth: Payer: Self-pay | Admitting: Oncology

## 2014-04-30 ENCOUNTER — Ambulatory Visit (HOSPITAL_BASED_OUTPATIENT_CLINIC_OR_DEPARTMENT_OTHER): Payer: Commercial Managed Care - HMO

## 2014-04-30 ENCOUNTER — Telehealth: Payer: Self-pay | Admitting: Cardiovascular Disease

## 2014-04-30 VITALS — BP 160/42 | HR 54 | Temp 97.9°F | Resp 18 | Ht 70.0 in | Wt 196.3 lb

## 2014-04-30 DIAGNOSIS — D631 Anemia in chronic kidney disease: Secondary | ICD-10-CM

## 2014-04-30 DIAGNOSIS — N183 Chronic kidney disease, stage 3 unspecified: Secondary | ICD-10-CM

## 2014-04-30 DIAGNOSIS — D696 Thrombocytopenia, unspecified: Secondary | ICD-10-CM | POA: Diagnosis not present

## 2014-04-30 DIAGNOSIS — I509 Heart failure, unspecified: Secondary | ICD-10-CM

## 2014-04-30 DIAGNOSIS — D61818 Other pancytopenia: Secondary | ICD-10-CM

## 2014-04-30 DIAGNOSIS — N189 Chronic kidney disease, unspecified: Secondary | ICD-10-CM

## 2014-04-30 DIAGNOSIS — R161 Splenomegaly, not elsewhere classified: Secondary | ICD-10-CM

## 2014-04-30 DIAGNOSIS — D649 Anemia, unspecified: Secondary | ICD-10-CM | POA: Diagnosis not present

## 2014-04-30 DIAGNOSIS — N289 Disorder of kidney and ureter, unspecified: Secondary | ICD-10-CM

## 2014-04-30 LAB — CBC WITH DIFFERENTIAL/PLATELET
BASO%: 0.4 % (ref 0.0–2.0)
Basophils Absolute: 0 10*3/uL (ref 0.0–0.1)
EOS ABS: 0 10*3/uL (ref 0.0–0.5)
EOS%: 0.9 % (ref 0.0–7.0)
HCT: 31.6 % — ABNORMAL LOW (ref 38.4–49.9)
HGB: 10.1 g/dL — ABNORMAL LOW (ref 13.0–17.1)
LYMPH#: 0.4 10*3/uL — AB (ref 0.9–3.3)
LYMPH%: 15.9 % (ref 14.0–49.0)
MCH: 28.1 pg (ref 27.2–33.4)
MCHC: 32 g/dL (ref 32.0–36.0)
MCV: 87.8 fL (ref 79.3–98.0)
MONO#: 0.1 10*3/uL (ref 0.1–0.9)
MONO%: 5.3 % (ref 0.0–14.0)
NEUT%: 77.5 % — ABNORMAL HIGH (ref 39.0–75.0)
NEUTROS ABS: 1.8 10*3/uL (ref 1.5–6.5)
NRBC: 0 % (ref 0–0)
Platelets: 29 10*3/uL — ABNORMAL LOW (ref 140–400)
RBC: 3.6 10*6/uL — AB (ref 4.20–5.82)
RDW: 20.9 % — ABNORMAL HIGH (ref 11.0–14.6)
WBC: 2.3 10*3/uL — AB (ref 4.0–10.3)

## 2014-04-30 MED ORDER — EPOETIN ALFA 40000 UNIT/ML IJ SOLN
40000.0000 [IU] | Freq: Once | INTRAMUSCULAR | Status: DC
  Filled 2014-04-30: qty 1

## 2014-04-30 MED ORDER — EPOETIN ALFA 20000 UNIT/ML IJ SOLN
40000.0000 [IU] | Freq: Once | INTRAMUSCULAR | Status: DC
  Administered 2014-04-30: 40000 [IU] via SUBCUTANEOUS

## 2014-04-30 MED ORDER — METOLAZONE 2.5 MG PO TABS: ORAL_TABLET | ORAL | Status: DC

## 2014-04-30 NOTE — Progress Notes (Addendum)
  [name] OFFICE PROGRESS NOTE   Diagnosis:   Anemia, thrombocytopenia, splenomegaly  INTERVAL HISTORY:   [name] returns as scheduled. A trial of weekly erythropoietin was initiated on 04/09/2014. Energy level continues to be poor. No shortness of breath. No bleeding. He continues to note easy bruising. He reports weight gain, increased leg edema and abdominal distention.  He reports being scheduled for aortic valve replacement 05/14/2014.  Objective:  Vital signs in last 24 hours:  Blood pressure 160/42, pulse 54, temperature 97.9 F (36.6 C), temperature source Oral, resp. rate 18, height _0  (1.778 m), weight 196 lb 4.8 oz (89.041 kg), SpO2 99 %.    HEENT:  No thrush or ulcers. Resp:  Lungs clear bilaterally. Cardio:  Regular rate and rhythm. 2/6 systolic murmur. GI:  Abdomen is distended. Spleen palpable left abdomen. Vascular:  Firm pitting edema below the knees bilaterally.  Skin:  Ecchymoses left lateral abdomen.    Lab Results:  Lab Results  Component Value Date   WBC 2.3* 04/30/2014   HGB 10.1* 04/30/2014   HCT 31.6* 04/30/2014   MCV 87.8 04/30/2014   PLT 29* 04/30/2014   NEUTROABS 1.8 04/30/2014    Imaging:  No results found.  Medications: I have reviewed the patient's current medications.  Assessment/Plan: 1.Chronic pancytopenia/splenomegaly-likely related to a chronic lymphoproliferative disorder,? Splenic lymphoma   Bone marrow biopsy at [facility] November 2012-slightly hypercellular marrow with trilineage hematopoiesis, interstitial Nieman-Pick like histiocytosis. Negative for dysplasia, negative for lymphoma, negative for increased blasts. 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 History of severe aortic stenosis, status post aortic valve replacement surgery at [facility] on 01/23/2011  3. History of gout  4. Diabetes  5. Streptococcus bacteremia-TEE negative for endocarditis   6. Malaise-likely multifactorial 7. Renal insufficiency  8. Paroxysmal atrial fibrillation  9. Colon polyps noted on a virtual colonoscopy 02/10/2013  10. Anemia secondary to renal insufficiency and the chronic lymphoproliferative disorder.  Trial of weekly erythropoietin initiated 04/09/2014. Improved.   Disposition: [name] hemoglobin has improved coinciding with the initiation of weekly erythropoietin.  Dr. Benay Walton recommends proceeding with erythropoietin today and then placing on hold.  If the platelet count needs to be higher prior to surgery we can resume thrombopoietin therapy.  He notes increased leg edema, abdominal distention and is gaining weight. We will contact Dr. Antionette Walton office to try to arrange for an appointment early next week.  [name] will return for a follow-up visit here on 05/21/2014 which will be one week following the aortic valve replacement.  Patient seen with Dr. Benay Walton.  [name] ANP/GNP-BC   04/30/2014  10:23 AM  This was a shared visit with [name]. [name] was interviewed and examined.  He has gained weight. The abdomen is distended and he has lower extremity edema. I suspect this is related to heart failure. We asked him to schedule an appointment with [name].  The plan for surgery is noted. We will prescribe nplate if the platelet count needs to be higher for the procedure.  [name], M.D.   [name], M.D.

## 2014-04-30 NOTE — Telephone Encounter (Signed)
[place] is calling trying to schedule an appt for next week with Dr. Burt Knack, the patient has had 13 pd weight gain and leg edema abdomen distention and his is having a heart valve replacement. Patient needs a soon appt than what could be offered. Please give a call back to.   Thanks.

## 2014-04-30 NOTE — Addendum Note (Signed)
Addended by: Margaret Pyle on: 04/30/2014 11:31 AM   Modules accepted: Orders

## 2014-04-30 NOTE — Telephone Encounter (Signed)
Contacted Dr. Antionette Char  Office to have and appointment for new symptoms (13 lb weight gain in 2 weeks, leg edema, and abdominal distention) per Elby Showers. Marcello Moores, NP.  Earliest appointment is 05/24/14 with Kathlen Mody.  Left Message for Dr. Antionette Char nurse to call back.

## 2014-04-30 NOTE — Telephone Encounter (Signed)
Per Dr Burt Knack he would like the pt to take Metolazone 2.5mg  30 minutes prior to the pt's afternoon dosage of Furosemide this afternoon. The pt currently takes Furosemide 80mg  one by mouth twice a day. Per Dr Burt Knack the pt should proceed to the ER this weekend if he develops issues with worsening SOB. We will add the pt to Dr Antionette Char clinic on Monday.    I spoke with the pt and he is actually getting ready to take his afternoon dose of Furosemide.  I made the pt aware that Dr Burt Knack would like him to take Metolazone to see if we can get his weight decreased. Because the pt cannot get to the pharmacy until later today I advised him to take Metolazone 2.5mg  30 minutes prior to his morning dosage of Furosemide on Saturday. Pt verbalized understanding of instructions. At this time the pt states his SOB is the same and has not worsened. I advised the pt that if he has worsening of symptoms over the weekend he should proceed to the ER. Appointment scheduled on 05/03/14.

## 2014-04-30 NOTE — Telephone Encounter (Signed)
gave and printed appt sched adn avs for pt for May

## 2014-05-02 NOTE — Progress Notes (Signed)
Cardiology Office Note   Date:  05/04/2014   ID:  [name], DOB 1923-01-07, MRN [id number]  PCP:  [name], MD  Cardiologist:  [name], MD    Chief Complaint  Patient presents with  . Shortness of Breath    History of Present Illness: [name] is a 81 y.o. male who presents for follow-up evaluation prior to TAVR for treatment of severe symptomatic bioprosthetic aortic valve insufficiency. The patient underwent aortic valve replacement using a 25 mm Medtronic Mosaic porcine bioprosthetic tissue valve via right anterior mini thoracotomy approach by Dr. Evelina Dun at [facility] in January 2013. He initially had moderate AI and there was concern of paravalvular leak. He developed Group D Streptococcal bacteremia in 2014 but TEE did not show evidence of significant aortic valve insufficiency or vegetation. He has gone on to develop progressive bioprosthetic valve insufficiency and stenosis with worsening leaflet dysfunction on subsequent echo studies. He has now undergone extensive evaluation for 'valve-in-valve' TAVR and presents today for further discussion.   He developed abdominal swelling last week and I called in metolazone. He's lost 5# since that time and is feeling better. His shortness of breath is unchanged and continues to occur with exertion. Fatigue is also unchanged. He denies orthopnea, PND. Continues to have significant leg swelling.   Past Medical History  Diagnosis Date  . Stroke   . Atrial fibrillation     a. amiodarone therapy; not felt to be a candidate for anticoagulation - bruises easily.  . Bradycardia     a. only tolerates metoprolol 12.5mg  daily (bradycardic w/ bid dosing).  . Aortic stenosis     a. Previously severe -> s/p minimally invasive tissue AVR with Dr. Evelina Dun at [facility] 01/2011 (pre-AVR cath with no obs CAD);  b. 12/2012 TEE: EF 60-65%, no veg, mild AS, triv AI.  [name] Kitchen Hypertension   . Anemia   . Action tremor   .  Thrombocytopenia     Dr. Benay Spice  . Colon polyp   . Anxiety   . Splenomegaly   . Gout   . GERD (gastroesophageal reflux disease)   . Hypothyroidism   . Leukopenia     Chronic pancytopenia  . Degenerative disc disease   . Joint effusion, knee     left knee  . Synovial cyst of popliteal space   . Cellulitis of left leg 10/11-16/2012  . CKD (chronic kidney disease), stage III   . Chronic diastolic CHF (congestive heart failure)     a. 12/15/2012 TEE: EF 60-65%, no veg.  . Bacteremia     a. 12/2012 - S bovis;  b. 12/2012 TEE w/o veg;  c. Seeing ID->Rocephin therapy extended to 01/25/2013 via PICC for possible endocarditis (No veg on TEE).  . High cholesterol   . Heart murmur   . Diabetes mellitus     "borderline" (01/05/2013)  . History of blood transfusion 01/2011; 11/2012    "related to heart valve replaced; low HgB count" (01/05/2013)  . H/O hiatal hernia   . Throat cancer     s/p lasered  . Diverticulosis   . Severe aortic insufficiency 04/05/2014    Past Surgical History  Procedure Laterality Date  . Cholecystectomy    . Joint effusion      left knee  . Aortic valve replacement  01/23/2011    via minimally invasive approach per Dr Evelina Dun, [facility]  . Tee without cardioversion N/A 12/15/2012    Procedure: TRANSESOPHAGEAL ECHOCARDIOGRAM (TEE);  Surgeon: [name], MD;  Location: [facility] ENDOSCOPY;  Service: Cardiovascular;  Laterality: N/A;  . Cardiac valve replacement    . Inguinal hernia repair Right   . Excisional hemorrhoidectomy    . Cardiac catheterization    . Surgery scrotal / testicular      "removed one" (01/05/2013)  . 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 Tee without cardioversion N/A 03/26/2014    Procedure: TRANSESOPHAGEAL ECHOCARDIOGRAM (TEE);  Surgeon: Josue Hector, MD;  Location[facility] ENDOSCOPY;  Service:  Cardiovascular;  Laterality: N/A;    Current Outpatient Prescriptions  Medication Sig Dispense Refill  . allopurinol (ZYLOPRIM) 100 MG tablet Take 1 tablet (100 mg total) by mouth daily. 90 tablet 0  . amiodarone (PACERONE) 200 MG tablet Take 1 tablet (200 mg total) by mouth daily. 90 tablet 1  . amLODipine (NORVASC) 5 MG tablet Take 1 tablet (5 mg total) by mouth daily. 90 tablet 1  . aspirin 325 MG tablet Take 325 mg by mouth daily.      . Cholecalciferol (VITAMIN D) 1000 UNITS capsule Take 1 capsule (1,000 Units total) by mouth daily. 90 capsule 3  . clonazePAM (KLONOPIN) 0.5 MG tablet 1/2 as needed if you wake up early (Patient taking differently: Take 0.5 mg by mouth daily as needed (wake up early). ) 30 tablet 1  . ferrous sulfate 325 (65 FE) MG EC tablet Take 1 tablet (325 mg total) by mouth 2 (two) times daily.  3  . fish oil-omega-3 fatty acids 1000 MG capsule Take 1 g by mouth daily.      . Flaxseed, Linseed, (FLAX SEED OIL) 1000 MG CAPS Take 1 capsule by mouth daily.     . furosemide (LASIX) 80 MG tablet Take 1 tablet (80 mg total) by mouth 2 (two) times daily. 90 tablet 1  . levothyroxine (LEVOTHROID) 25 MCG tablet Take 1 tablet (25 mcg total) by mouth daily. 90 tablet 1  . metolazone (ZAROXOLYN) 2.5 MG tablet Take one tablet by mouth 30 minutes prior to Furosemide dosage as directed 12 tablet 0  . Multiple Vitamin (MULTIVITAMIN) tablet Take 1 tablet by mouth daily.      . mupirocin ointment (BACTROBAN) 2 % Applied twice a day to the affected area 22 g 0  . omeprazole (PRILOSEC) 20 MG capsule Take 1 capsule (20 mg total) by mouth daily. 90 capsule 1  . potassium chloride (K-DUR) 10 MEQ tablet Take 1 tablet (10 mEq total) by mouth daily. 90 tablet 1  . propranolol (INDERAL) 10 MG tablet Take 1 tablet (10 mg total) by mouth 2 (two) times daily. 180 tablet 1  . sertraline (ZOLOFT) 50 MG tablet Take 1 tablet (50 mg total) by mouth daily. 90 tablet 0  . vitamin C (ASCORBIC ACID) 500 MG  tablet Take 1 tablet (500 mg total) by mouth daily. 90 tablet 3  . colchicine 0.6 MG tablet Take 0.6 mg by mouth daily as needed (gout flares). Take one tablet by mouth daily during an acute episode of gout.     No current facility-administered medications for this visit.   Facility-Administered Medications Ordered in Other Visits  Medication Dose Route Frequency Provider Last Rate Last Dose  . epoetin alfa (EPOGEN,PROCRIT) injection 40,000 Units  40,000 Units Subcutaneous Once Ladell Pier, MD  Allergies:   Penicillins and Neomycin-bacitracin zn-polymyx   Social History:  The patient  reports that he quit smoking about 52 years ago. His smoking use included Cigarettes. He has a 18 pack-year smoking history. He has never used smokeless tobacco. He reports that he does not drink alcohol or use illicit drugs.   Family History:  [name] family history includes Cancer in his other; Colon cancer in an other family member; Stroke in his other and another family member.    ROS:  Please see the history of present illness.  Otherwise, review of systems is positive for fatigue, easy bruising, leg swelling, leg pain.  All other systems are reviewed and negative.    PHYSICAL EXAM: VS:  BP 112/46 mmHg  Pulse 51  Ht 5\' 10"  (1.778 m)  Wt 191 lb 6.4 oz (86.818 kg)  BMI 27.46 kg/m2 , BMI Body mass index is 27.46 kg/(m^2). GEN: Well nourished, well developed, pleasant elderly male in no acute distress HEENT: normal Neck: no JVD, no masses. No carotid bruits Cardiac: RRR with grade 3/6 harsh systolic murmur at the RUSB and a 2/6 diastolic decrescendo murmur at the RUSB                Respiratory:  clear to auscultation bilaterally, normal work of breathing GI: soft, nontender, nondistended, + BS MS: no deformity or atrophy Ext: 2+ bilateral pretibial edema Skin: chronic stasis dermatitis noted Neuro:  Strength and sensation are intact Psych: euthymic mood, full affect  EKG:  EKG is  not ordered today.  Recent Labs: 03/11/2014: ALT 17; TSH 5.15* 04/26/2014: BUN 53*; Creatinine 2.52*; Potassium 4.3; Sodium 144 04/30/2014: Hemoglobin 10.1*; Platelets 29*   Lipid Panel     Component Value Date/Time   CHOL 84 09/21/2013 0809   TRIG 94.0 09/21/2013 0809   HDL 25.60* 09/21/2013 0809   CHOLHDL 3 09/21/2013 0809   VLDL 18.8 09/21/2013 0809   LDLCALC 40 09/21/2013 0809      Wt Readings from Last 3 Encounters:  05/03/14 191 lb 6.4 oz (86.818 kg)  04/30/14 196 lb 4.8 oz (89.041 kg)  04/26/14 190 lb (86.183 kg)     Cardiac Studies Reviewed: 2D Echo 3.3.16: Study Conclusions  - Left ventricle: The cavity size was normal. There was moderate concentric hypertrophy. Wall motion was normal; there were no regional wall motion abnormalities. Doppler parameters are consistent with pseduonormal left ventricular relaxation (grade 2 diastolic dysfunction). The E/A ratio is 2.3. The E/e&' ratio is >20, suggesting markedly elevated LV filling pressure. - Aortic valve: Trileaflet bioprosthetic valve with significant degeneration; calcified leaflets with a small amount of echogenic material seen prolapsing into the LVOT with valve closure. Severe aortic stenosis - peak and mean gradients of 61 and 41 mmHg. There is moderate AI. Based on a measured LVOT diameter of 2.0 cm, the indexed AVA is 0.6 cm2/m2. The calculated AVA by continuity is higher, however, this neglects the moderate amount of aortic insufficiency. Valve area (VTI): 1.28 cm^2. Valve area (Vmean): 1.18 cm^2. - Mitral valve: Calcified leaflets with mild restriction. Mild regurgitation. Posterior calcified annulus. - Left atrium: Severely dilated at 66 ml/m2. - Right atrium: The atrium was mildly dilated. - Tricuspid valve: There was moderate regurgitation. - Pulmonary arteries: PA peak pressure: 51 mm Hg (S). - Inferior vena cava: The vessel was normal in size. The respirophasic  diameter changes were in the normal range (>= 50%), consistent with normal central venous pressure.  Impressions:  - Compared with the prior  study in 09/2013, there is clearly severe bioprosthetic aortic valve stenosis. There is also moderate insufficiency and what appears to be calclified material that prolapses into the LVOT on aortic valve closure. Biventricular filling pressures are very high.  CTA Chest, Abdomen, Pelvis 4.14.2016: VASCULAR MEASUREMENTS PERTINENT TO TAVR:  AORTA:  Minimal Aortic Diameter - 18 x 20 mm  Severity of Aortic Calcification - moderate  RIGHT PELVIS:  Right Common Iliac Artery -  Minimal Diameter - 13.1 x 12.7 mm  Tortuosity - moderate  Calcification - mild  Right External Iliac Artery -  Minimal Diameter - 10.2 x 9.5 mm  Tortuosity - moderate  Calcification - none  Right Common Femoral Artery -  Minimal Diameter - 9.9 x 10.1 mm  Tortuosity - mild  Calcification - none  LEFT PELVIS:  Left Common Iliac Artery -  Minimal Diameter - 11.6 x 11.4 mm  Tortuosity - moderate  Calcification - moderate  Left External Iliac Artery -  Minimal Diameter - 8.5 x 9.6 mm  Tortuosity - moderate  Calcification - none  Left Common Femoral Artery -  Minimal Diameter - 11.4 x 10.4 mm  Tortuosity - mild  Calcification - mild  Review of the MIP images confirms the above findings.  IMPRESSION: 1. Vascular findings and measurements pertinent to potential TAVR procedure, as detailed above. This patient does appear to have suitable pelvic arterial access for the procedure. 2. Stigmata of cirrhosis with portal hypertension, as above, including massive splenomegaly. 3. Small volume of ascites. 4. Severe circumferential thickening of the distal rectum immediately above the anal verge. While this could simply reflect a proctitis, correlation with physical examination and sigmoidoscopy is  suggested in the near future to exclude the possibility of a distal rectal neoplasm. 5. 7 mm noncalcified subpleural nodule in the right lower lobe. Given the patient's advanced age, and the presence of multiple calcified granulomas, this may be a benign area of scarring. If of clinical concern, repeat noncontrast chest CT could be performed in 6-12 months to assess for stability. 6. Colonic diverticulosis without findings to suggest acute diverticulitis at this time. 58. Sequela of old granulomatous disease, as above. 8. Severe renal atrophy with multiple cysts in the kidneys bilaterally, the majority of which are simple cysts. In addition, there is a 5.7 cm Bosniak class 2 cyst in the lower pole of the left kidney (and calcifications in the wall). 9. Additional incidental findings, as above.  CTA Heart 4.8.2016: IMPRESSION: 1. A 25 mm Mosaic Ultra porcine valve with degenerative changes and annular measurements suitable for delivery of 20 mm Edward-SAPIEN 3 valve.  2. There is sufficient distance between struts and coronary arteries.  3. Optimum Fluoroscopic Angle for Delivery: LAO 6 CRA 6  Cardiac Catheterization 3.29.2016: Hemodynamic Findings: Ao: 146/30  LV: Aortic valve crossed RA: 5  RV: 42/3/9 PA: 42/13 (mean 25)  PCWP: 18 Fick Cardiac Output: 7.19 L/min Fick Cardiac Index: 3.6 L/min/m2 Central Aortic Saturation: 96% Pulmonary Artery Saturation: 64%  Angiographic Findings:  Left main: No obstructive disease.   Left Anterior Descending Artery: Large caliber vessel that courses to the apex. The proximal vessel is heavily calcified with diffuse 20% stenosis. The mid vessel is calcified with diffuse 20% stenosis. The distal vessel has mild plaque disease. There is a moderate caliber diagonal branch with no obstructive disease.   Circumflex Artery: Large caliber vessel with moderate caliber first obtuse marginal branch and large  caliber second obtuse marginal branch. No obstructive disease. There is calcification  noted in throughout the obtuse marginal branches and the AV groove Circumflex.   Right Coronary Artery: Large dominant vessel with calcification noted in the mid vessel. The proximal vessel has 20% stenosis. The mid vessel has a focal calcified 40% stenosis. The distal vessel has diffuse 30% stenosis.   Left Ventricular Angiogram: Deferred.   Impression: 1. Mild non-obstructive CAD 2. Mild elevation filling pressures 3. Known failure of bioprosthetic aortic valve with severe AI/AS.   ASSESSMENT AND PLAN: 81 year-old man with severe bioprosthetic valvular aortic insufficiency and stenosis. He has NYHA Class III symptoms of exertional shortness of breath, fatigue, and edema. We have had extensive discussion about treatment options. He is not a candidate for conventional redo AVR because of advanced age and multiple comorbid medical conditions as have been previously outlined (STS Mortality Risk 13.6%). We have reviewed pros and cons of palliative medical therapy versus Valve-in-Valve TAVR and he favors treatment with TAVR. I agree that this is the most prudent way to proceed in this otherwise independent and functional elderly gentleman.   The patient is here with his wife today. I have personally reviewed his cath and echo/TEE films. They understand that we cannot be certain that his aortic regurgitation is all central rather than paravalvular because of the eccentricity of the regurgitant jet. However, he clearly has progressive leaflet dysfunction based on serial echo studies and after review with multiple colleagues I think the primary valve problem is related to leaflet dysfunction. This should be treatable with Valve-in-Valve TAVR.  Following the decision to proceed with transcatheter aortic valve replacement, a discussion has been held regarding what types of management strategies would be attempted  intraoperatively in the event of life-threatening complications, including whether or not the patient would be considered a candidate for the use of cardiopulmonary bypass and/or conversion to open sternotomy for attempted surgical intervention.  The patient has been advised of a variety of complications that might develop including but not limited to risks of death, stroke, paravalvular leak, aortic dissection or other major vascular complications, aortic annulus rupture, device embolization, cardiac rupture or perforation, mitral regurgitation, acute myocardial infarction, arrhythmia, heart block or bradycardia requiring permanent pacemaker placement, congestive heart failure, respiratory failure, renal failure, pneumonia, infection, other late complications related to structural valve deterioration or migration, or other complications that might ultimately cause a temporary or permanent loss of functional independence or other long term morbidity.  The patient provides full informed consent for the procedure as described and all questions were answered.  The patient has had longstanding thrombocytopenia. I have spoken with Dr Benay Spice who will admister thrombopoietin therapy this week. Hopefully this will improve his platelet count which was most recently 29,000. If not, we will administer a platelet transfusion at the time of the TAVR procedure. Because of CKD Stage IV, thrombocytopenia, and tenuous clinical course, the patient will be admitted May 5th one day prior to TAVR for pre-procedure hydration.  Current medicines are reviewed with the patient today.  The patient does not have concerns regarding medicines.  Labs/ tests ordered today include:  No orders of the defined types were placed in this encounter.   Disposition:  Hospital admission as planned 5/5 one day prior to TAVR.  Signed, [name], MD  05/04/2014 3:48 PM    [facility] [place], [address],  [place]  [id number] Phone: [phone]; Fax: [phone]

## 2014-05-03 ENCOUNTER — Ambulatory Visit (INDEPENDENT_AMBULATORY_CARE_PROVIDER_SITE_OTHER): Payer: Commercial Managed Care - HMO | Admitting: Cardiovascular Disease

## 2014-05-03 ENCOUNTER — Encounter: Payer: Self-pay | Admitting: Cardiovascular Disease

## 2014-05-03 VITALS — BP 112/46 | HR 51 | Ht 70.0 in | Wt 191.4 lb

## 2014-05-03 DIAGNOSIS — I350 Nonrheumatic aortic (valve) stenosis: Secondary | ICD-10-CM | POA: Diagnosis not present

## 2014-05-03 NOTE — Patient Instructions (Signed)
Medication Instructions:  Your physician has recommended you make the following change in your medication:  1. Please take Metolazone 2.5mg  tomorrow morning 30 minutes prior to your morning dosage of Furosemide. Take an additional 38mEq of potassium tomorrow.   Labwork: No new orders.   Testing/Procedures: No new orders.  Follow-Up: We will arrange further follow-up after TAVR.   Any Other Special Instructions Will Be Listed Below (If Applicable).

## 2014-05-04 ENCOUNTER — Other Ambulatory Visit: Payer: Self-pay | Admitting: Nurse Practitioner

## 2014-05-04 ENCOUNTER — Telehealth: Payer: Self-pay | Admitting: Nurse Practitioner

## 2014-05-04 DIAGNOSIS — D696 Thrombocytopenia, unspecified: Secondary | ICD-10-CM

## 2014-05-04 MED ORDER — ROMIPLOSTIM 250 MCG ~~LOC~~ SOLR: 1.0000 ug/kg | SUBCUTANEOUS | Status: DC

## 2014-05-04 NOTE — Telephone Encounter (Signed)
Lft msg for pt confirming labs/inj per 04/26 POF, advised I would leave updated schedule up front for pt to p/u at next visit.... KJ

## 2014-05-05 ENCOUNTER — Ambulatory Visit: Payer: Commercial Managed Care - HMO

## 2014-05-05 ENCOUNTER — Other Ambulatory Visit: Payer: Commercial Managed Care - HMO

## 2014-05-05 ENCOUNTER — Telehealth: Payer: Self-pay | Admitting: *Deleted

## 2014-05-05 NOTE — Telephone Encounter (Signed)
Called patient because of missed appointment.  He and his say they didn't get a message about the appointment.  Rescheduled appointment for tomorrow 05/06/14.  Wife aware of date and time.

## 2014-05-06 ENCOUNTER — Other Ambulatory Visit (HOSPITAL_BASED_OUTPATIENT_CLINIC_OR_DEPARTMENT_OTHER): Payer: Commercial Managed Care - HMO

## 2014-05-06 ENCOUNTER — Ambulatory Visit (HOSPITAL_BASED_OUTPATIENT_CLINIC_OR_DEPARTMENT_OTHER): Payer: Commercial Managed Care - HMO

## 2014-05-06 VITALS — BP 160/33 | HR 49 | Temp 97.9°F

## 2014-05-06 DIAGNOSIS — D696 Thrombocytopenia, unspecified: Secondary | ICD-10-CM

## 2014-05-06 DIAGNOSIS — N183 Chronic kidney disease, stage 3 (moderate): Secondary | ICD-10-CM

## 2014-05-06 DIAGNOSIS — D631 Anemia in chronic kidney disease: Secondary | ICD-10-CM | POA: Diagnosis not present

## 2014-05-06 DIAGNOSIS — D47Z9 Other specified neoplasms of uncertain behavior of lymphoid, hematopoietic and related tissue: Secondary | ICD-10-CM

## 2014-05-06 LAB — CBC WITH DIFFERENTIAL/PLATELET
BASO%: 0 % (ref 0.0–2.0)
BASOS ABS: 0 10*3/uL (ref 0.0–0.1)
EOS%: 1.3 % (ref 0.0–7.0)
Eosinophils Absolute: 0 10*3/uL (ref 0.0–0.5)
HCT: 31.4 % — ABNORMAL LOW (ref 38.4–49.9)
HEMOGLOBIN: 10.2 g/dL — AB (ref 13.0–17.1)
LYMPH#: 0.5 10*3/uL — AB (ref 0.9–3.3)
LYMPH%: 20.2 % (ref 14.0–49.0)
MCH: 28.2 pg (ref 27.2–33.4)
MCHC: 32.5 g/dL (ref 32.0–36.0)
MCV: 86.7 fL (ref 79.3–98.0)
MONO#: 0.1 10*3/uL (ref 0.1–0.9)
MONO%: 6.3 % (ref 0.0–14.0)
NEUT%: 72.2 % (ref 39.0–75.0)
NEUTROS ABS: 1.6 10*3/uL (ref 1.5–6.5)
Platelets: 29 10*3/uL — ABNORMAL LOW (ref 140–400)
RBC: 3.62 10*6/uL — ABNORMAL LOW (ref 4.20–5.82)
RDW: 20.8 % — AB (ref 11.0–14.6)
WBC: 2.2 10*3/uL — ABNORMAL LOW (ref 4.0–10.3)
nRBC: 0 % (ref 0–0)

## 2014-05-06 LAB — TECHNOLOGIST REVIEW

## 2014-05-06 MED ORDER — ROMIPLOSTIM 250 MCG ~~LOC~~ SOLR
1.0000 ug/kg | SUBCUTANEOUS | Status: DC
  Administered 2014-05-06: 85 ug via SUBCUTANEOUS
  Filled 2014-05-06: qty 0.17

## 2014-05-10 ENCOUNTER — Other Ambulatory Visit: Payer: Self-pay

## 2014-05-11 ENCOUNTER — Other Ambulatory Visit (HOSPITAL_BASED_OUTPATIENT_CLINIC_OR_DEPARTMENT_OTHER): Payer: Commercial Managed Care - HMO

## 2014-05-11 ENCOUNTER — Ambulatory Visit (HOSPITAL_BASED_OUTPATIENT_CLINIC_OR_DEPARTMENT_OTHER): Payer: Commercial Managed Care - HMO

## 2014-05-11 VITALS — BP 128/40 | HR 51 | Temp 97.9°F | Resp 17

## 2014-05-11 DIAGNOSIS — D631 Anemia in chronic kidney disease: Secondary | ICD-10-CM | POA: Diagnosis not present

## 2014-05-11 DIAGNOSIS — N183 Chronic kidney disease, stage 3 (moderate): Secondary | ICD-10-CM

## 2014-05-11 DIAGNOSIS — D696 Thrombocytopenia, unspecified: Secondary | ICD-10-CM

## 2014-05-11 LAB — CBC WITH DIFFERENTIAL/PLATELET
BASO%: 0.5 % (ref 0.0–2.0)
Basophils Absolute: 0 10*3/uL (ref 0.0–0.1)
EOS ABS: 0 10*3/uL (ref 0.0–0.5)
EOS%: 0.9 % (ref 0.0–7.0)
HCT: 31.5 % — ABNORMAL LOW (ref 38.4–49.9)
HGB: 10.1 g/dL — ABNORMAL LOW (ref 13.0–17.1)
LYMPH#: 0.3 10*3/uL — AB (ref 0.9–3.3)
LYMPH%: 15.1 % (ref 14.0–49.0)
MCH: 28.1 pg (ref 27.2–33.4)
MCHC: 32.1 g/dL (ref 32.0–36.0)
MCV: 87.7 fL (ref 79.3–98.0)
MONO#: 0.1 10*3/uL (ref 0.1–0.9)
MONO%: 3.8 % (ref 0.0–14.0)
NEUT#: 1.7 10*3/uL (ref 1.5–6.5)
NEUT%: 79.7 % — ABNORMAL HIGH (ref 39.0–75.0)
Platelets: 31 10*3/uL — ABNORMAL LOW (ref 140–400)
RBC: 3.59 10*6/uL — AB (ref 4.20–5.82)
RDW: 19.9 % — AB (ref 11.0–14.6)
WBC: 2.1 10*3/uL — ABNORMAL LOW (ref 4.0–10.3)

## 2014-05-11 LAB — TECHNOLOGIST REVIEW

## 2014-05-11 MED ORDER — ROMIPLOSTIM 250 MCG ~~LOC~~ SOLR
1.0000 ug/kg | Freq: Once | SUBCUTANEOUS | Status: AC
  Administered 2014-05-11: 85 ug via SUBCUTANEOUS
  Filled 2014-05-11: qty 0.17

## 2014-05-11 NOTE — Patient Instructions (Signed)
Romiplostim injection What is this medicine? ROMIPLOSTIM (roe mi PLOE stim) helps your body make more platelets. This medicine is used to treat low platelets caused by chronic idiopathic thrombocytopenic purpura (ITP). This medicine may be used for other purposes; ask your health care provider or pharmacist if you have questions. COMMON BRAND NAME(S): Nplate What should I tell my health care provider before I take this medicine? They need to know if you have any of these conditions: -cancer or myelodysplastic syndrome -low blood counts, like low white cell, platelet, or red cell counts -take medicines that treat or prevent blood clots -an unusual or allergic reaction to romiplostim, mannitol, other medicines, foods, dyes, or preservatives -pregnant or trying to get pregnant -breast-feeding How should I use this medicine? This medicine is for injection under the skin. It is given by a health care professional in a hospital or clinic setting. A special MedGuide will be given to you before your injection. Read this information carefully each time. Talk to your pediatrician regarding the use of this medicine in children. Special care may be needed. Overdosage: If you think you have taken too much of this medicine contact a poison control center or emergency room at once. NOTE: This medicine is only for you. Do not share this medicine with others. What if I miss a dose? It is important not to miss your dose. Call your doctor or health care professional if you are unable to keep an appointment. What may interact with this medicine? Interactions are not expected. This list may not describe all possible interactions. Give your health care provider a list of all the medicines, herbs, non-prescription drugs, or dietary supplements you use. Also tell them if you smoke, drink alcohol, or use illegal drugs. Some items may interact with your medicine. What should I watch for while using this  medicine? Your condition will be monitored carefully while you are receiving this medicine. Visit your prescriber or health care professional for regular checks on your progress and for the needed blood tests. It is important to keep all appointments. What side effects may I notice from receiving this medicine? Side effects that you should report to your doctor or health care professional as soon as possible: -allergic reactions like skin rash, itching or hives, swelling of the face, lips, or tongue -shortness of breath, chest pain, swelling in a leg -unusual bleeding or bruising Side effects that usually do not require medical attention (report to your doctor or health care professional if they continue or are bothersome): -dizziness -headache -muscle aches -pain in arms and legs -stomach pain -trouble sleeping This list may not describe all possible side effects. Call your doctor for medical advice about side effects. You may report side effects to FDA at 1-800-FDA-1088. Where should I keep my medicine? This drug is given in a hospital or clinic and will not be stored at home. NOTE: This sheet is a summary. It may not cover all possible information. If you have questions about this medicine, talk to your doctor, pharmacist, or health care provider.  2015, Elsevier/Gold Standard. (2007-08-25 15:13:04)  

## 2014-05-11 NOTE — Progress Notes (Signed)
Informed Ned Card, NP and Dr. Benay Spice of pt BP, 147/27 manual- 128/40. Pt has heart failure and is having valve replacement Friday. Ok to give injection per Dr. Glori Bickers

## 2014-05-12 ENCOUNTER — Other Ambulatory Visit: Payer: Self-pay | Admitting: Internal Medicine

## 2014-05-13 ENCOUNTER — Inpatient Hospital Stay (HOSPITAL_COMMUNITY)
Admission: RE | Admit: 2014-05-13 | Discharge: 2014-05-17 | DRG: 266 | Disposition: A | Discharge status: Home / Self Care | Payer: Commercial Managed Care - HMO | Source: Ambulatory Visit | Attending: Cardiovascular Disease | Admitting: Cardiovascular Disease

## 2014-05-13 DIAGNOSIS — I272 Other secondary pulmonary hypertension: Secondary | ICD-10-CM | POA: Diagnosis present

## 2014-05-13 DIAGNOSIS — I129 Hypertensive chronic kidney disease with stage 1 through stage 4 chronic kidney disease, or unspecified chronic kidney disease: Secondary | ICD-10-CM | POA: Diagnosis present

## 2014-05-13 DIAGNOSIS — E785 Hyperlipidemia, unspecified: Secondary | ICD-10-CM | POA: Diagnosis present

## 2014-05-13 DIAGNOSIS — E119 Type 2 diabetes mellitus without complications: Secondary | ICD-10-CM | POA: Diagnosis present

## 2014-05-13 DIAGNOSIS — Z8673 Personal history of transient ischemic attack (TIA), and cerebral infarction without residual deficits: Secondary | ICD-10-CM

## 2014-05-13 DIAGNOSIS — E039 Hypothyroidism, unspecified: Secondary | ICD-10-CM | POA: Diagnosis present

## 2014-05-13 DIAGNOSIS — Z7982 Long term (current) use of aspirin: Secondary | ICD-10-CM | POA: Diagnosis not present

## 2014-05-13 DIAGNOSIS — D696 Thrombocytopenia, unspecified: Secondary | ICD-10-CM | POA: Diagnosis present

## 2014-05-13 DIAGNOSIS — I480 Paroxysmal atrial fibrillation: Secondary | ICD-10-CM | POA: Diagnosis present

## 2014-05-13 DIAGNOSIS — D61818 Other pancytopenia: Secondary | ICD-10-CM | POA: Diagnosis present

## 2014-05-13 DIAGNOSIS — I351 Nonrheumatic aortic (valve) insufficiency: Secondary | ICD-10-CM

## 2014-05-13 DIAGNOSIS — I872 Venous insufficiency (chronic) (peripheral): Secondary | ICD-10-CM | POA: Diagnosis present

## 2014-05-13 DIAGNOSIS — N183 Chronic kidney disease, stage 3 unspecified: Secondary | ICD-10-CM | POA: Diagnosis present

## 2014-05-13 DIAGNOSIS — E118 Type 2 diabetes mellitus with unspecified complications: Secondary | ICD-10-CM

## 2014-05-13 DIAGNOSIS — T82857A Stenosis of cardiac prosthetic devices, implants and grafts, initial encounter: Principal | ICD-10-CM | POA: Diagnosis present

## 2014-05-13 DIAGNOSIS — K449 Diaphragmatic hernia without obstruction or gangrene: Secondary | ICD-10-CM | POA: Diagnosis present

## 2014-05-13 DIAGNOSIS — Y831 Surgical operation with implant of artificial internal device as the cause of abnormal reaction of the patient, or of later complication, without mention of misadventure at the time of the procedure: Secondary | ICD-10-CM | POA: Diagnosis present

## 2014-05-13 DIAGNOSIS — Z85819 Personal history of malignant neoplasm of unspecified site of lip, oral cavity, and pharynx: Secondary | ICD-10-CM

## 2014-05-13 DIAGNOSIS — D62 Acute posthemorrhagic anemia: Secondary | ICD-10-CM | POA: Diagnosis not present

## 2014-05-13 DIAGNOSIS — Z952 Presence of prosthetic heart valve: Secondary | ICD-10-CM

## 2014-05-13 DIAGNOSIS — Z87891 Personal history of nicotine dependence: Secondary | ICD-10-CM | POA: Diagnosis not present

## 2014-05-13 DIAGNOSIS — I10 Essential (primary) hypertension: Secondary | ICD-10-CM | POA: Diagnosis present

## 2014-05-13 DIAGNOSIS — Z954 Presence of other heart-valve replacement: Secondary | ICD-10-CM | POA: Diagnosis not present

## 2014-05-13 DIAGNOSIS — I350 Nonrheumatic aortic (valve) stenosis: Secondary | ICD-10-CM

## 2014-05-13 DIAGNOSIS — Z006 Encounter for examination for normal comparison and control in clinical research program: Secondary | ICD-10-CM | POA: Diagnosis not present

## 2014-05-13 DIAGNOSIS — I2510 Atherosclerotic heart disease of native coronary artery without angina pectoris: Secondary | ICD-10-CM | POA: Diagnosis present

## 2014-05-13 DIAGNOSIS — K219 Gastro-esophageal reflux disease without esophagitis: Secondary | ICD-10-CM | POA: Diagnosis present

## 2014-05-13 DIAGNOSIS — I5033 Acute on chronic diastolic (congestive) heart failure: Secondary | ICD-10-CM | POA: Diagnosis present

## 2014-05-13 HISTORY — DX: Presence of prosthetic heart valve: Z95.2

## 2014-05-13 LAB — CBC
HCT: 30.9 % — ABNORMAL LOW (ref 39.0–52.0)
Hemoglobin: 10 g/dL — ABNORMAL LOW (ref 13.0–17.0)
MCH: 28.3 pg (ref 26.0–34.0)
MCHC: 32.4 g/dL (ref 30.0–36.0)
MCV: 87.5 fL (ref 78.0–100.0)
PLATELETS: 36 10*3/uL — AB (ref 150–400)
RBC: 3.53 MIL/uL — AB (ref 4.22–5.81)
RDW: 19.6 % — ABNORMAL HIGH (ref 11.5–15.5)
WBC: 1.9 10*3/uL — ABNORMAL LOW (ref 4.0–10.5)

## 2014-05-13 LAB — COMPREHENSIVE METABOLIC PANEL
ALBUMIN: 3.5 g/dL (ref 3.5–5.0)
ALK PHOS: 95 U/L (ref 38–126)
ALT: 33 U/L (ref 17–63)
ALT: 33 U/L (ref 17–63)
ANION GAP: 10 (ref 5–15)
AST: 44 U/L — AB (ref 15–41)
AST: 42 U/L — ABNORMAL HIGH (ref 15–41)
Albumin: 3.7 g/dL (ref 3.5–5.0)
Alkaline Phosphatase: 82 U/L (ref 38–126)
Anion gap: 9 (ref 5–15)
BILIRUBIN TOTAL: 0.9 mg/dL (ref 0.3–1.2)
BUN: 81 mg/dL — ABNORMAL HIGH (ref 6–20)
BUN: 82 mg/dL — AB (ref 6–20)
CALCIUM: 9.2 mg/dL (ref 8.9–10.3)
CALCIUM: 9 mg/dL (ref 8.9–10.3)
CO2: 25 mmol/L (ref 22–32)
CO2: 26 mmol/L (ref 22–32)
CREATININE: 2.74 mg/dL — AB (ref 0.61–1.24)
CREATININE: 2.64 mg/dL — AB (ref 0.61–1.24)
Chloride: 103 mmol/L (ref 101–111)
Chloride: 103 mmol/L (ref 101–111)
GFR calc Af Amer: 23 mL/min — ABNORMAL LOW (ref >60)
GFR calc non Af Amer: 19 mL/min — ABNORMAL LOW (ref >60)
GFR, EST AFRICAN AMERICAN: 22 mL/min — AB (ref >60)
GFR, EST NON AFRICAN AMERICAN: 20 mL/min — AB (ref >60)
GLUCOSE: 123 mg/dL — AB (ref 70–99)
Glucose, Bld: 141 mg/dL — ABNORMAL HIGH (ref 70–99)
Potassium: 4.6 mmol/L (ref 3.5–5.1)
Potassium: 4.7 mmol/L (ref 3.5–5.1)
Sodium: 138 mmol/L (ref 135–145)
Sodium: 138 mmol/L (ref 135–145)
TOTAL PROTEIN: 6.7 g/dL (ref 6.5–8.1)
Total Bilirubin: 0.8 mg/dL (ref 0.3–1.2)
Total Protein: 6 g/dL — ABNORMAL LOW (ref 6.5–8.1)

## 2014-05-13 LAB — URINALYSIS, ROUTINE W REFLEX MICROSCOPIC
Bilirubin Urine: NEGATIVE
GLUCOSE, UA: NEGATIVE mg/dL
HGB URINE DIPSTICK: NEGATIVE
KETONES UR: NEGATIVE mg/dL
Leukocytes, UA: NEGATIVE
Nitrite: NEGATIVE
PROTEIN: NEGATIVE mg/dL
Specific Gravity, Urine: 1.008 (ref 1.005–1.030)
Urobilinogen, UA: 0.2 mg/dL (ref 0.0–1.0)
pH: 5.5 (ref 5.0–8.0)

## 2014-05-13 LAB — BLOOD GAS, ARTERIAL
Acid-base deficit: 1.1 mmol/L (ref 0.0–2.0)
Bicarbonate: 22.8 mEq/L (ref 20.0–24.0)
DRAWN BY: 31101
FIO2: 0.21 %
O2 SAT: 98.7 %
PH ART: 7.413 (ref 7.350–7.450)
PO2 ART: 128 mmHg — AB (ref 80.0–100.0)
Patient temperature: 98.6
TCO2: 24 mmol/L (ref 0–100)
pCO2 arterial: 36.5 mmHg (ref 35.0–45.0)

## 2014-05-13 LAB — GLUCOSE, CAPILLARY
GLUCOSE-CAPILLARY: 125 mg/dL — AB (ref 70–99)
Glucose-Capillary: 120 mg/dL — ABNORMAL HIGH (ref 70–99)

## 2014-05-13 LAB — CBC WITH DIFFERENTIAL/PLATELET
Basophils Absolute: 0 10*3/uL (ref 0.0–0.1)
Basophils Relative: 0 % (ref 0–1)
Eosinophils Absolute: 0 10*3/uL (ref 0.0–0.7)
Eosinophils Relative: 1 % (ref 0–5)
HCT: 32.6 % — ABNORMAL LOW (ref 39.0–52.0)
HEMOGLOBIN: 10.7 g/dL — AB (ref 13.0–17.0)
Lymphocytes Relative: 16 % (ref 12–46)
Lymphs Abs: 0.3 10*3/uL — ABNORMAL LOW (ref 0.7–4.0)
MCH: 28.5 pg (ref 26.0–34.0)
MCHC: 32.8 g/dL (ref 30.0–36.0)
MCV: 86.7 fL (ref 78.0–100.0)
Monocytes Absolute: 0.1 10*3/uL (ref 0.1–1.0)
Monocytes Relative: 6 % (ref 3–12)
NEUTROS ABS: 1.5 10*3/uL — AB (ref 1.7–7.7)
NEUTROS PCT: 78 % — AB (ref 43–77)
Platelets: 39 10*3/uL — ABNORMAL LOW (ref 150–400)
RBC: 3.76 MIL/uL — ABNORMAL LOW (ref 4.22–5.81)
RDW: 19.7 % — AB (ref 11.5–15.5)
WBC: 2 10*3/uL — ABNORMAL LOW (ref 4.0–10.5)

## 2014-05-13 LAB — PROTIME-INR
INR: 1.17 (ref 0.00–1.49)
INR: 1.23 (ref 0.00–1.49)
PROTHROMBIN TIME: 15 s (ref 11.6–15.2)
PROTHROMBIN TIME: 15.7 s — AB (ref 11.6–15.2)

## 2014-05-13 LAB — APTT: APTT: 35 s (ref 24–37)

## 2014-05-13 LAB — BRAIN NATRIURETIC PEPTIDE: B Natriuretic Peptide: 642.8 pg/mL — ABNORMAL HIGH (ref 0.0–100.0)

## 2014-05-13 MED ORDER — DEXMEDETOMIDINE HCL IN NACL 400 MCG/100ML IV SOLN
0.1000 ug/kg/h | INTRAVENOUS | Status: DC
  Filled 2014-05-13: qty 100

## 2014-05-13 MED ORDER — CHLORHEXIDINE GLUCONATE 4 % EX LIQD
30.0000 mL | Freq: Once | CUTANEOUS | Status: AC
  Administered 2014-05-13: 2 via TOPICAL
  Filled 2014-05-13: qty 30

## 2014-05-13 MED ORDER — OMEGA-3 FATTY ACIDS 1000 MG PO CAPS: 1.0000 g | ORAL_CAPSULE | Freq: Every day | ORAL | Status: DC

## 2014-05-13 MED ORDER — FLAX SEED OIL 1000 MG PO CAPS: 1.0000 | ORAL_CAPSULE | Freq: Every day | ORAL | Status: DC

## 2014-05-13 MED ORDER — LEVOTHYROXINE SODIUM 25 MCG PO TABS
25.0000 ug | ORAL_TABLET | Freq: Every day | ORAL | Status: DC
  Administered 2014-05-14: 25 ug via ORAL
  Filled 2014-05-13 (×2): qty 1

## 2014-05-13 MED ORDER — VITAMIN C 500 MG PO TABS
500.0000 mg | ORAL_TABLET | Freq: Every day | ORAL | Status: DC
  Filled 2014-05-13: qty 1

## 2014-05-13 MED ORDER — VANCOMYCIN HCL 10 G IV SOLR
1500.0000 mg | INTRAVENOUS | Status: AC
  Administered 2014-05-14: 1500 mg via INTRAVENOUS
  Filled 2014-05-13: qty 1500

## 2014-05-13 MED ORDER — SODIUM CHLORIDE 0.9 % IV SOLN: 250.0000 mL | INTRAVENOUS | Status: DC | PRN

## 2014-05-13 MED ORDER — SODIUM CHLORIDE 0.9 % IJ SOLN: 3.0000 mL | Freq: Two times a day (BID) | INTRAMUSCULAR | Status: DC

## 2014-05-13 MED ORDER — PANTOPRAZOLE SODIUM 40 MG PO TBEC: 40.0000 mg | DELAYED_RELEASE_TABLET | Freq: Every day | ORAL | Status: DC

## 2014-05-13 MED ORDER — SODIUM CHLORIDE 0.9 % IJ SOLN: 3.0000 mL | INTRAMUSCULAR | Status: DC | PRN

## 2014-05-13 MED ORDER — POTASSIUM CHLORIDE ER 10 MEQ PO TBCR
10.0000 meq | EXTENDED_RELEASE_TABLET | Freq: Every day | ORAL | Status: DC
  Filled 2014-05-13: qty 1

## 2014-05-13 MED ORDER — SERTRALINE HCL 50 MG PO TABS
50.0000 mg | ORAL_TABLET | Freq: Every day | ORAL | Status: DC
  Filled 2014-05-13: qty 1

## 2014-05-13 MED ORDER — ASPIRIN 325 MG PO TABS
325.0000 mg | ORAL_TABLET | Freq: Every day | ORAL | Status: DC
  Filled 2014-05-13: qty 1

## 2014-05-13 MED ORDER — CEFUROXIME SODIUM 1.5 G IJ SOLR
1.5000 g | INTRAMUSCULAR | Status: AC
  Administered 2014-05-14: 1.5 g via INTRAVENOUS
  Filled 2014-05-13: qty 1.5

## 2014-05-13 MED ORDER — EPINEPHRINE HCL 1 MG/ML IJ SOLN
0.0000 ug/min | INTRAVENOUS | Status: DC
  Filled 2014-05-13: qty 4

## 2014-05-13 MED ORDER — FERROUS SULFATE 325 (65 FE) MG PO TABS
325.0000 mg | ORAL_TABLET | Freq: Two times a day (BID) | ORAL | Status: DC
  Administered 2014-05-14: 325 mg via ORAL
  Filled 2014-05-13 (×3): qty 1

## 2014-05-13 MED ORDER — AMLODIPINE BESYLATE 5 MG PO TABS
5.0000 mg | ORAL_TABLET | Freq: Every day | ORAL | Status: DC
  Filled 2014-05-13: qty 1

## 2014-05-13 MED ORDER — ALLOPURINOL 100 MG PO TABS
100.0000 mg | ORAL_TABLET | Freq: Every day | ORAL | Status: DC
  Filled 2014-05-13: qty 1

## 2014-05-13 MED ORDER — ACETAMINOPHEN 325 MG PO TABS
650.0000 mg | ORAL_TABLET | ORAL | Status: DC | PRN
Start: 2014-05-13 — End: 2014-05-17
  Administered 2014-05-16: 650 mg via ORAL

## 2014-05-13 MED ORDER — POTASSIUM CHLORIDE 2 MEQ/ML IV SOLN
80.0000 meq | INTRAVENOUS | Status: DC
  Filled 2014-05-13: qty 40

## 2014-05-13 MED ORDER — ONE-DAILY MULTI VITAMINS PO TABS: 1.0000 | ORAL_TABLET | Freq: Every day | ORAL | Status: DC

## 2014-05-13 MED ORDER — MAGNESIUM SULFATE 50 % IJ SOLN
40.0000 meq | INTRAMUSCULAR | Status: DC
  Filled 2014-05-13: qty 10

## 2014-05-13 MED ORDER — ADULT MULTIVITAMIN W/MINERALS CH
1.0000 | ORAL_TABLET | Freq: Every day | ORAL | Status: DC
  Filled 2014-05-13: qty 1

## 2014-05-13 MED ORDER — PHENYLEPHRINE HCL 10 MG/ML IJ SOLN
30.0000 ug/min | INTRAVENOUS | Status: DC
  Filled 2014-05-13: qty 2

## 2014-05-13 MED ORDER — SODIUM CHLORIDE 0.9 % IV SOLN
INTRAVENOUS | Status: DC
  Administered 2014-05-13: 20:00:00 via INTRAVENOUS

## 2014-05-13 MED ORDER — ONDANSETRON HCL 4 MG/2ML IJ SOLN: 4.0000 mg | Freq: Four times a day (QID) | INTRAMUSCULAR | Status: DC | PRN

## 2014-05-13 MED ORDER — NITROGLYCERIN IN D5W 200-5 MCG/ML-% IV SOLN
2.0000 ug/min | INTRAVENOUS | Status: DC
  Filled 2014-05-13: qty 250

## 2014-05-13 MED ORDER — SODIUM CHLORIDE 0.9 % WEIGHT BASED INFUSION: 1.0000 mL/kg/h | INTRAVENOUS | Status: DC

## 2014-05-13 MED ORDER — CHLORHEXIDINE GLUCONATE 4 % EX LIQD
30.0000 mL | Freq: Once | CUTANEOUS | Status: AC
  Administered 2014-05-14: 2 via TOPICAL
  Filled 2014-05-13: qty 30

## 2014-05-13 MED ORDER — HEPARIN SODIUM (PORCINE) 5000 UNIT/ML IJ SOLN
5000.0000 [IU] | Freq: Three times a day (TID) | INTRAMUSCULAR | Status: DC
  Filled 2014-05-13: qty 1

## 2014-05-13 MED ORDER — FERROUS SULFATE 325 (65 FE) MG PO TBEC: 325.0000 mg | DELAYED_RELEASE_TABLET | Freq: Two times a day (BID) | ORAL | Status: DC

## 2014-05-13 MED ORDER — PROPRANOLOL HCL 10 MG PO TABS
10.0000 mg | ORAL_TABLET | Freq: Two times a day (BID) | ORAL | Status: DC
  Filled 2014-05-13 (×3): qty 1

## 2014-05-13 MED ORDER — OMEGA-3-ACID ETHYL ESTERS 1 G PO CAPS
1.0000 g | ORAL_CAPSULE | Freq: Every day | ORAL | Status: DC
  Filled 2014-05-13: qty 1

## 2014-05-13 MED ORDER — AMIODARONE HCL 200 MG PO TABS
200.0000 mg | ORAL_TABLET | Freq: Every day | ORAL | Status: DC
  Filled 2014-05-13: qty 1

## 2014-05-13 MED ORDER — NOREPINEPHRINE BITARTRATE 1 MG/ML IV SOLN
0.0000 ug/min | INTRAVENOUS | Status: DC
  Filled 2014-05-13: qty 4

## 2014-05-13 MED ORDER — VITAMIN D 1000 UNITS PO TABS
1000.0000 [IU] | ORAL_TABLET | Freq: Every day | ORAL | Status: DC
  Filled 2014-05-13: qty 1

## 2014-05-13 MED ORDER — SODIUM CHLORIDE 0.9 % IV SOLN
INTRAVENOUS | Status: DC
  Filled 2014-05-13: qty 2.5

## 2014-05-13 MED ORDER — DOPAMINE-DEXTROSE 3.2-5 MG/ML-% IV SOLN
0.0000 ug/kg/min | INTRAVENOUS | Status: DC
  Filled 2014-05-13: qty 250

## 2014-05-13 MED ORDER — SODIUM CHLORIDE 0.9 % IV SOLN
INTRAVENOUS | Status: DC
  Filled 2014-05-13: qty 30

## 2014-05-13 NOTE — H&P (Signed)
Patient ID: [name] MRN: [id number], DOB/AGE: [birth year]   Admit date: 05/13/2014   Primary Physician: [name], MD Primary Cardiologist: Dr. Burt Knack  Pt. Profile:  81 year old male who has history of HTN, HLD, DM, CKD stage III, chronic diastolic HF, hypothyroidism, thrombocytopenia followed by Dr. Benay Spice, PAF on amiodarone however not candidate for coumadin, and h/o AS s/p porcine valve with degeneration into severe AR present to admission for TAVR  Problem List  Past Medical History  Diagnosis Date  . Stroke   . Atrial fibrillation     a. amiodarone therapy; not felt to be a candidate for anticoagulation - bruises easily.  . Bradycardia     a. only tolerates metoprolol 12.5mg  daily (bradycardic w/ bid dosing).  . Aortic stenosis     a. Previously severe -> s/p minimally invasive tissue AVR with Dr. Evelina Dun at [facility] 01/2011 (pre-AVR cath with no obs CAD);  b. 12/2012 TEE: EF 60-65%, no veg, mild AS, triv AI.  [name] Kitchen Hypertension   . Anemia   . Action tremor   . Thrombocytopenia     Dr. Benay Spice  . Colon polyp   . Anxiety   . Splenomegaly   . Gout   . GERD (gastroesophageal reflux disease)   . Hypothyroidism   . Leukopenia     Chronic pancytopenia  . Degenerative disc disease   . Joint effusion, knee     left knee  . Synovial cyst of popliteal space   . Cellulitis of left leg 10/11-16/2012  . CKD (chronic kidney disease), stage III   . Chronic diastolic CHF (congestive heart failure)     a. 12/15/2012 TEE: EF 60-65%, no veg.  . Bacteremia     a. 12/2012 - S bovis;  b. 12/2012 TEE w/o veg;  c. Seeing ID->Rocephin therapy extended to 01/25/2013 via PICC for possible endocarditis (No veg on TEE).  . High cholesterol   . Heart murmur   . Diabetes mellitus     "borderline" (01/05/2013)  . History of blood transfusion 01/2011; 11/2012    "related to heart valve replaced; low HgB count" (01/05/2013)  . H/O hiatal hernia   . Throat cancer     s/p lasered  .  Diverticulosis   . Severe aortic insufficiency 04/05/2014    Past Surgical History  Procedure Laterality Date  . Cholecystectomy    . Joint effusion      left knee  . Aortic valve replacement  01/23/2011    via minimally invasive approach per Dr Evelina Dun, [facility]  . Tee without cardioversion N/A 12/15/2012    Procedure: TRANSESOPHAGEAL ECHOCARDIOGRAM (TEE);  Surgeon: [name], MD;  Location: [facility] ENDOSCOPY;  Service: Cardiovascular;  Laterality: N/A;  . Cardiac valve replacement    . Inguinal hernia repair Right   . Excisional hemorrhoidectomy    . Cardiac catheterization    . Surgery scrotal / testicular      "removed one" (01/05/2013)  . 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 Tee without cardioversion N/A 03/26/2014    Procedure: TRANSESOPHAGEAL ECHOCARDIOGRAM (TEE);  Surgeon: Josue Hector, MD;  Location: [facility] ENDOSCOPY;  Service: Cardiovascular;  Laterality: N/A;     Allergies  Allergies  Allergen Reactions  . Penicillins Other (See Comments)    Does not remember reaction (~year 1950)  . Neomycin-Bacitracin  Zn-Polymyx Other (See Comments)    ? Reaction (thinks he remembers redness)    HPI  The patient is a 81 year old male who has history of HTN, HLD, DM, CKD stage III, chronic diastolic HF, hypothyroidism, thrombocytopenia followed by Dr. Benay Spice, PAF on amiodarone however not candidate for coumadin, and h/o AS s/p porcine valve with degeneration into severe AR. According to medical record, he underwent aortic valve replacement using 25 mm Medtronic Mosaic porcine bioprosthetic tissue valve via right anterior mini thoracotomy approach by Dr. Evelina Dun at [facility] in Jan 2013. He had a cardiac catheterization prior to the procedure which did not reveal significant CAD. He initially had moderate AI, and there was concern of paravalvular  leak. He developed D streptococcal bacteremia in 2014 but he did not reveal any significant aortic valve insufficiency or vegetation. He has developed significant progressive bioprosthetic valve insufficiency and the stenosis with worsening leaflet dysfunction on subsequent echo studies. Patient underwent extensive evaluation for potential TAVR.  He continued to have NYHA class III exertional shortness of breath. He is currently on metolazone along with 80 mg twice a day of by mouth Lasix. After reviewing his previous cath, echo and CTA result, Dr. Burt Knack recommended Valve in Valve TAVR. Although there was initially concern for per valve leak, she had a transesophageal echo which showed severe AR that appeared mostly related to perforation at the base with her right leaflet and had not paravalvular. Gated cardiac CT of the chest shows 25 mm Mosaic ultra porcine valve with degenerative changes and annular measurements suitable for delivery of 20 mm Edward-SAPIEN 3 valve, there is sufficient distance between struts and coronary arteries. CT of abdomen and pelvis showed adequate transfemoral axis. Of note, he had long-standing history of thrombocytopenia which has been followed by Dr. Benay Spice. He has been administered thrombopoietin therapy as part of the preparation for TAVR procedure.  Patient presented to [facility] 05/13/2014 in preparation for TAVR procedure on the following day. Of note, he is last lab on 05/11/2014 shows platelet of 31.   Home Medications  Prior to Admission medications   Medication Sig Start Date End Date Taking? Authorizing Provider  amiodarone (PACERONE) 200 MG tablet Take 1 tablet (200 mg total) by mouth daily. 02/04/14  Yes [name], MD  amLODipine (NORVASC) 5 MG tablet Take 1 tablet (5 mg total) by mouth daily. 02/04/14  Yes [name], MD  aspirin 325 MG tablet Take 325 mg by mouth daily.     Yes Historical Provider, MD  Cholecalciferol (VITAMIN D) 1000 UNITS  capsule Take 1 capsule (1,000 Units total) by mouth daily. 01/05/11  Yes Renella Cunas, MD  clonazePAM (KLONOPIN) 0.5 MG tablet 1/2 as needed if you wake up early Patient taking differently: Take 0.5 mg by mouth daily as needed (wake up early).  03/08/14  Yes Hendricks Limes, MD  colchicine 0.6 MG tablet Take 0.6 mg by mouth daily as needed (gout flares). Take one tablet by mouth daily during an acute episode of gout. 04/03/11  Yes Renella Cunas, MD  ferrous sulfate 325 (65 FE) MG EC tablet Take 1 tablet (325 mg total) by mouth 2 (two) times daily. 04/09/14  Yes Ladell Pier, MD  fish oil-omega-3 fatty acids 1000 MG capsule Take 1 g by mouth daily.     Yes Historical Provider, MD  Flaxseed, Linseed, (FLAX SEED OIL) 1000 MG CAPS Take 1 capsule by mouth daily.    Yes Historical Provider, MD  furosemide (LASIX)  80 MG tablet Take 1 tablet (80 mg total) by mouth 2 (two) times daily. 03/25/14  Yes [name], MD  levothyroxine (LEVOTHROID) 25 MCG tablet Take 1 tablet (25 mcg total) by mouth daily. 02/04/14 02/04/15 Yes [name], MD  metolazone (ZAROXOLYN) 2.5 MG tablet Take one tablet by mouth 30 minutes prior to Furosemide dosage as directed 04/30/14  Yes [name], MD  Multiple Vitamin (MULTIVITAMIN) tablet Take 1 tablet by mouth daily.     Yes Historical Provider, MD  mupirocin ointment (BACTROBAN) 2 % Applied twice a day to the affected area 02/15/14  Yes Hendricks Limes, MD  omeprazole (PRILOSEC) 20 MG capsule Take 1 capsule (20 mg total) by mouth daily. 02/04/14  Yes [name], MD  potassium chloride (K-DUR) 10 MEQ tablet Take 1 tablet (10 mEq total) by mouth daily. 02/04/14  Yes [name], MD  propranolol (INDERAL) 10 MG tablet Take 1 tablet (10 mg total) by mouth 2 (two) times daily. 09/15/13  Yes Hendricks Limes, MD  sertraline (ZOLOFT) 50 MG tablet Take 1 tablet (50 mg total) by mouth daily. 04/13/14  Yes Hendricks Limes, MD  vitamin C (ASCORBIC ACID) 500 MG tablet Take 1 tablet  (500 mg total) by mouth daily. 01/05/11  Yes Renella Cunas, MD  allopurinol (ZYLOPRIM) 100 MG tablet TAKE 1 TABLET EVERY DAY 05/12/14   Hendricks Limes, MD    Family History  Family History  Problem Relation Age of Onset  . Colon cancer    . Stroke    . Cancer Other     colon  . Stroke Other     Social History  History   Social History  . Marital Status: Married    Spouse Name: N/A  . Number of Children: 3  . Years of Education: N/A   Occupational History  . Retired Agricultural consultant    Social History Main Topics  . Smoking status: Former Smoker -- 0.75 packs/day for 24 years    Types: Cigarettes    Quit date: 01/11/1962  . Smokeless tobacco: Never Used  . Alcohol Use: No  . Drug Use: No  . Sexual Activity: No   Other Topics Concern  . Not on file   Social History Narrative   Lives at his farm outside of [place]...does some work   Lives with his wife, been married to her for 20 years,   Smoked until 1964 about 7 cigarettes a day for 30 years   No alcohol history.   No drug history                 Review of Systems General:  No chills, fever, night sweats or weight changes.  Cardiovascular:  No chest pain, orthopnea, palpitations, paroxysmal nocturnal dyspnea. +dyspnea on exertion, edema Dermatological: No rash, lesions/masses Respiratory: No cough +dyspnea during exertion, no dyspnea at rest Urologic: No hematuria, dysuria Abdominal:   No nausea, vomiting, diarrhea Neurologic:  No changes in mental status. All other systems reviewed and are otherwise negative except as noted above.  Physical Exam  Blood pressure 166/33, pulse 50, temperature 97.5 F (36.4 C), temperature source Oral, resp. rate 20, SpO2 100 %.  General: Pleasant, NAD Psych: Normal affect. Neuro: Alert and oriented X 3. Moves all extremities spontaneously. HEENT: Normal  Neck: Supple without bruits or JVD. Lungs:  Resp regular and unlabored, CTA. Heart: RRR no s3, s4. 3/6 systolic murmur at R  upper sternal border Abdomen: Soft, non-tender, non-distended, BS + x 4.  Extremities: No clubbing, cyanosis. DP/PT/Radials 2+ and equal bilaterally. 1+ pitting edema in bilateral LE  Labs  Troponin (Point of Care Test) No results for input(s): TROPIPOC in the last 72 hours. No results for input(s): CKTOTAL, CKMB, TROPONINI in the last 72 hours. Lab Results  Component Value Date   WBC 2.1* 05/11/2014   HGB 10.1* 05/11/2014   HCT 31.5* 05/11/2014   MCV 87.7 05/11/2014   PLT 31* 05/11/2014   No results for input(s): NA, K, CL, CO2, BUN, CREATININE, CALCIUM, PROT, BILITOT, ALKPHOS, ALT, AST, GLUCOSE in the last 168 hours.  Invalid input(s): LABALBU Lab Results  Component Value Date   CHOL 84 09/21/2013   HDL 25.60* 09/21/2013   LDLCALC 40 09/21/2013   TRIG 94.0 09/21/2013   Lab Results  Component Value Date   DDIMER 0.41 04/01/2012     Radiology/Studies  Ct Coronary Morp W/cta Cor W/score W/ca W/cm &/or Wo/cm  04/21/2014   ADDENDUM REPORT: 04/21/2014 17:32  CLINICAL DATA:  81 year old male, s/p 25 mm Mosaic Ultra porcine valve, now with severe aortic stenosis.  EXAM: Cardiac TAVR CT  TECHNIQUE: The patient was scanned on a Philips 256 scanner. A 120 kV retrospective scan was triggered in the descending thoracic aorta at 111 HU's. Gantry rotation speed was 270 msecs and collimation was .9 mm. No beta blockade or nitro were given. The 3D data set was reconstructed in 5% intervals of the R-R cycle. Systolic and diastolic phases were analyzed on a dedicated work station using MPR, MIP and VRT modes. The patient received 80 cc of contrast.  FINDINGS: Aortic Valve: A bioprosthetic valve is present in the aortic position. The minimal internal diameter is 20 x 20 mm. There is minimal subvalvular calcification. Leaflets appears thickened.  Aorta: Normal caliber, minimal diffuse calcifications, no dissection.  Sinotubular Junction:  33 x 32  mm  Ascending Thoracic Aorta:  37 x 37 mm  Aortic  Arch:  not visualized  Descending Thoracic Aorta:  32 x 31 mm  Coronary Artery distance from bioprosthetic valvular struts:  Left Main:  8 mm  Right Coronary:  10 mm  Measurements at the level of bioprosthetic valvular struts (at the level of coronary origin):  Maximum/Minimum Diameter:  23.6 x 23.6 mm  Area:  465 mm2  Virtual Basal Annulus Measurements:  Maximum/Minimum Diameter:  22 x 20 mm  Perimeter:  75 mm  Area:  300 mm2  Coronary Arteries: Normal origin, right dominance. Diffuse calcifications, predominantly in proximal and mid LAD. The study performed without use of NTG and inadequate for coronary evaluation.  Optimum Fluoroscopic Angle for Delivery:  LAO 6 CRA 6  IMPRESSION: 1. A 25 mm Mosaic Ultra porcine valve with degenerative changes and annular measurements suitable for delivery of 20 mm Edward-SAPIEN 3 valve.  2. There is sufficient distance between struts and coronary arteries.  3. Optimum Fluoroscopic Angle for Delivery:  LAO 6 CRA 6  Ena Dawley   Electronically Signed   By: Ena Dawley   On: 04/21/2014 17:32   04/21/2014   EXAM: OVER-READ INTERPRETATION  CT CHEST  The following report is an over-read performed by radiologist Dr. Rosine Beat [facility] on 04/16/2014. This over-read does not include interpretation of cardiac or coronary anatomy or pathology. The coronary calcium score/coronary CTA interpretation by the cardiologist is attached.  COMPARISON:  CT chest from 01/12/2011.  FINDINGS: There is no evidence for mediastinal lymphadenopathy within the visualized portions of the mediastinum. 11 mm short axis  right hilar lymph node is mildly enlarged. Calcified nodal tissue is seen in the left hilum. The heart is enlarged. No pericardial effusion.  Lung windows show interlobular septal thickening in the bases suggesting pulmonary edema. Dependent atelectasis is noted in the lower lobes bilaterally.  Images which include portions of the upper abdomen demonstrate  perihepatic and perisplenic ascites. Although incompletely visualized, the spleen appears enlarged.  IMPRESSION: 1. Imaging features suggest interstitial edema with dependent atelectasis in the lower lobes bilaterally. 2. Borderline right hilar lymphadenopathy. 3. Abdominal ascites with probable splenomegaly. Irregular liver contour raises the question of underlying cirrhosis.  Electronically Signed: By: Misty Stanley M.D. On: 04/16/2014 11:16   Ct Angio Chest Aorta W/cm &/or Wo/cm  04/26/2014   CLINICAL DATA:  81 year old male with history of severe aortic stenosis. Preprocedural study prior to potential transcatheter aortic valve replacement (TAVR) procedure.  EXAM: CT ANGIOGRAPHY CHEST, ABDOMEN AND PELVIS  TECHNIQUE: Multidetector CT imaging through the chest, abdomen and pelvis was performed using the standard protocol during bolus administration of intravenous contrast. Multiplanar reconstructed images and MIPs were obtained and reviewed to evaluate the vascular anatomy.  CONTRAST:  65mL OMNIPAQUE IOHEXOL 350 MG/ML SOLN  COMPARISON:  CT of the abdomen and pelvis 02/09/2013. Chest CT 01/12/2011.  FINDINGS: CTA CHEST FINDINGS  Mediastinum/Lymph Nodes: Heart size is mildly enlarged. There is no significant pericardial fluid, thickening or pericardial calcification. There is atherosclerosis of the thoracic aorta, the great vessels of the mediastinum and the coronary arteries, including calcified atherosclerotic plaque in the left main, left anterior descending, left circumflex and right coronary arteries. Status post median sternotomy for aortic valve replacement with a stented bioprosthesis which appears thickened and partially calcified. There are multiple heavily calcified mediastinal and hilar lymph nodes (left greater than right), some of which are enlarged, measuring up to 2 cm in the left hilar region) these enlarged lymph nodes are heavily calcified and considered benign). No enlarged noncalcified lymph  nodes are noted. Esophagus is unremarkable in appearance. No axillary lymphadenopathy.  Lungs/Pleura: Several calcified granulomas are noted throughout the lungs bilaterally. 7 mm noncalcified subpleural pulmonary nodule in the right lower lobe (image 89 of series 5). No acute consolidative airspace disease. No pleural effusions. Mild centrilobular and paraseptal emphysema. Mild diffuse bronchial wall thickening.  Musculoskeletal/Soft Tissues: Multiple old healed left-sided rib fractures. Upper right sided sternotomy wires.  CTA ABDOMEN AND PELVIS FINDINGS  Hepatobiliary: The liver has a shrunken appearance and nodular contour, compatible with underlying cirrhosis. No definite hypervascular lesion identified at this time to indicate hepatocellular carcinoma. Status post cholecystectomy. No intra or extrahepatic biliary ductal dilatation. Portal vein is dilated measuring 18 mm in the porta hepatis.  Pancreas: Unremarkable.  Spleen: Massively enlarged spleen measuring 20.1 x 8.9 x 23.7 cm (estimated splenic volume of 2,120 mL). Multiple calcified granulomas in the spleen.  Adrenals/Urinary Tract: Kidneys are severely atrophic bilaterally with marked cortical thinning and multiple low-attenuation lesions throughout the kidneys bilaterally, compatible with simple cysts, largest of which is in the lower pole of the right kidney measuring 5.4 cm. In addition, in the lower pole of the left kidney there is an exophytic 5.8 cm low-attenuation lesion which has some thin peripheral calcifications in the wall, compatible with a Bosniak class 2 cyst. No hydroureteronephrosis. Urinary bladder is normal in appearance. Right adrenal gland is normal in appearance. 1.9 x 1.4 cm nodule in the lateral limb of the left adrenal gland is indeterminate.  Stomach/Bowel: The appearance of the stomach is normal. No  pathologic dilatation of small bowel or colon. Marked circumferential thickening of the distal rectum immediately above the anal  verge. Numerous colonic diverticulae are noted, without definite surrounding inflammatory changes to suggest an acute diverticulitis at this time.  Vascular/Lymphatic: Vascular findings and measurements pertinent to potential TAVR procedure, as detailed below. No lymphadenopathy noted in the abdomen or pelvis. Dilated portal vein, as above. Markedly dilated splenic vein.  Reproductive: Prostate gland and seminal vesicles are unremarkable in appearance.  Other: Small right inguinal hernia containing some fat and ascites. Small volume of ascites. No pneumoperitoneum.  Musculoskeletal: Well-defined sclerotic lesion with narrow zone of transition measuring 13 mm in the right side of the L5 vertebral body posteriorly, similar to the prior examination, likely a bone island. There are no aggressive appearing lytic or blastic lesions noted in the visualized portions of the skeleton.  VASCULAR MEASUREMENTS PERTINENT TO TAVR:  AORTA:  Minimal Aortic Diameter -  18 x 20 mm  Severity of Aortic Calcification -  moderate  RIGHT PELVIS:  Right Common Iliac Artery -  Minimal Diameter - 13.1 x 12.7 mm  Tortuosity - moderate  Calcification - mild  Right External Iliac Artery -  Minimal Diameter - 10.2 x 9.5 mm  Tortuosity - moderate  Calcification - none  Right Common Femoral Artery -  Minimal Diameter - 9.9 x 10.1 mm  Tortuosity - mild  Calcification - none  LEFT PELVIS:  Left Common Iliac Artery -  Minimal Diameter - 11.6 x 11.4 mm  Tortuosity - moderate  Calcification - moderate  Left External Iliac Artery -  Minimal Diameter - 8.5 x 9.6 mm  Tortuosity - moderate  Calcification - none  Left Common Femoral Artery -  Minimal Diameter - 11.4 x 10.4 mm  Tortuosity - mild  Calcification - mild  Review of the MIP images confirms the above findings.  IMPRESSION: 1. Vascular findings and measurements pertinent to potential TAVR procedure, as detailed above. This patient does appear to have suitable pelvic arterial access for the  procedure. 2. Stigmata of cirrhosis with portal hypertension, as above, including massive splenomegaly. 3. Small volume of ascites. 4. Severe circumferential thickening of the distal rectum immediately above the anal verge. While this could simply reflect a proctitis, correlation with physical examination and sigmoidoscopy is suggested in the near future to exclude the possibility of a distal rectal neoplasm. 5. 7 mm noncalcified subpleural nodule in the right lower lobe. Given the patient's advanced age, and the presence of multiple calcified granulomas, this may be a benign area of scarring. If of clinical concern, repeat noncontrast chest CT could be performed in 6-12 months to assess for stability. 6. Colonic diverticulosis without findings to suggest acute diverticulitis at this time. 31. Sequela of old granulomatous disease, as above. 8. Severe renal atrophy with multiple cysts in the kidneys bilaterally, the majority of which are simple cysts. In addition, there is a 5.7 cm Bosniak class 2 cyst in the lower pole of the left kidney (and calcifications in the wall). 9. Additional incidental findings, as above.   Electronically Signed   By: Vinnie Langton M.D.   On: 04/26/2014 16:01   Ct Angio Abd/pel W/ And/or W/o  04/26/2014   CLINICAL DATA:  81 year old male with history of severe aortic stenosis. Preprocedural study prior to potential transcatheter aortic valve replacement (TAVR) procedure.  EXAM: CT ANGIOGRAPHY CHEST, ABDOMEN AND PELVIS  TECHNIQUE: Multidetector CT imaging through the chest, abdomen and pelvis was performed using the standard protocol during  bolus administration of intravenous contrast. Multiplanar reconstructed images and MIPs were obtained and reviewed to evaluate the vascular anatomy.  CONTRAST:  44mL OMNIPAQUE IOHEXOL 350 MG/ML SOLN  COMPARISON:  CT of the abdomen and pelvis 02/09/2013. Chest CT 01/12/2011.  FINDINGS: CTA CHEST FINDINGS  Mediastinum/Lymph Nodes: Heart size is mildly  enlarged. There is no significant pericardial fluid, thickening or pericardial calcification. There is atherosclerosis of the thoracic aorta, the great vessels of the mediastinum and the coronary arteries, including calcified atherosclerotic plaque in the left main, left anterior descending, left circumflex and right coronary arteries. Status post median sternotomy for aortic valve replacement with a stented bioprosthesis which appears thickened and partially calcified. There are multiple heavily calcified mediastinal and hilar lymph nodes (left greater than right), some of which are enlarged, measuring up to 2 cm in the left hilar region) these enlarged lymph nodes are heavily calcified and considered benign). No enlarged noncalcified lymph nodes are noted. Esophagus is unremarkable in appearance. No axillary lymphadenopathy.  Lungs/Pleura: Several calcified granulomas are noted throughout the lungs bilaterally. 7 mm noncalcified subpleural pulmonary nodule in the right lower lobe (image 89 of series 5). No acute consolidative airspace disease. No pleural effusions. Mild centrilobular and paraseptal emphysema. Mild diffuse bronchial wall thickening.  Musculoskeletal/Soft Tissues: Multiple old healed left-sided rib fractures. Upper right sided sternotomy wires.  CTA ABDOMEN AND PELVIS FINDINGS  Hepatobiliary: The liver has a shrunken appearance and nodular contour, compatible with underlying cirrhosis. No definite hypervascular lesion identified at this time to indicate hepatocellular carcinoma. Status post cholecystectomy. No intra or extrahepatic biliary ductal dilatation. Portal vein is dilated measuring 18 mm in the porta hepatis.  Pancreas: Unremarkable.  Spleen: Massively enlarged spleen measuring 20.1 x 8.9 x 23.7 cm (estimated splenic volume of 2,120 mL). Multiple calcified granulomas in the spleen.  Adrenals/Urinary Tract: Kidneys are severely atrophic bilaterally with marked cortical thinning and multiple  low-attenuation lesions throughout the kidneys bilaterally, compatible with simple cysts, largest of which is in the lower pole of the right kidney measuring 5.4 cm. In addition, in the lower pole of the left kidney there is an exophytic 5.8 cm low-attenuation lesion which has some thin peripheral calcifications in the wall, compatible with a Bosniak class 2 cyst. No hydroureteronephrosis. Urinary bladder is normal in appearance. Right adrenal gland is normal in appearance. 1.9 x 1.4 cm nodule in the lateral limb of the left adrenal gland is indeterminate.  Stomach/Bowel: The appearance of the stomach is normal. No pathologic dilatation of small bowel or colon. Marked circumferential thickening of the distal rectum immediately above the anal verge. Numerous colonic diverticulae are noted, without definite surrounding inflammatory changes to suggest an acute diverticulitis at this time.  Vascular/Lymphatic: Vascular findings and measurements pertinent to potential TAVR procedure, as detailed below. No lymphadenopathy noted in the abdomen or pelvis. Dilated portal vein, as above. Markedly dilated splenic vein.  Reproductive: Prostate gland and seminal vesicles are unremarkable in appearance.  Other: Small right inguinal hernia containing some fat and ascites. Small volume of ascites. No pneumoperitoneum.  Musculoskeletal: Well-defined sclerotic lesion with narrow zone of transition measuring 13 mm in the right side of the L5 vertebral body posteriorly, similar to the prior examination, likely a bone island. There are no aggressive appearing lytic or blastic lesions noted in the visualized portions of the skeleton.  VASCULAR MEASUREMENTS PERTINENT TO TAVR:  AORTA:  Minimal Aortic Diameter -  18 x 20 mm  Severity of Aortic Calcification -  moderate  RIGHT PELVIS:  Right Common Iliac Artery -  Minimal Diameter - 13.1 x 12.7 mm  Tortuosity - moderate  Calcification - mild  Right External Iliac Artery -  Minimal Diameter -  10.2 x 9.5 mm  Tortuosity - moderate  Calcification - none  Right Common Femoral Artery -  Minimal Diameter - 9.9 x 10.1 mm  Tortuosity - mild  Calcification - none  LEFT PELVIS:  Left Common Iliac Artery -  Minimal Diameter - 11.6 x 11.4 mm  Tortuosity - moderate  Calcification - moderate  Left External Iliac Artery -  Minimal Diameter - 8.5 x 9.6 mm  Tortuosity - moderate  Calcification - none  Left Common Femoral Artery -  Minimal Diameter - 11.4 x 10.4 mm  Tortuosity - mild  Calcification - mild  Review of the MIP images confirms the above findings.  IMPRESSION: 1. Vascular findings and measurements pertinent to potential TAVR procedure, as detailed above. This patient does appear to have suitable pelvic arterial access for the procedure. 2. Stigmata of cirrhosis with portal hypertension, as above, including massive splenomegaly. 3. Small volume of ascites. 4. Severe circumferential thickening of the distal rectum immediately above the anal verge. While this could simply reflect a proctitis, correlation with physical examination and sigmoidoscopy is suggested in the near future to exclude the possibility of a distal rectal neoplasm. 5. 7 mm noncalcified subpleural nodule in the right lower lobe. Given the patient's advanced age, and the presence of multiple calcified granulomas, this may be a benign area of scarring. If of clinical concern, repeat noncontrast chest CT could be performed in 6-12 months to assess for stability. 6. Colonic diverticulosis without findings to suggest acute diverticulitis at this time. 83. Sequela of old granulomatous disease, as above. 8. Severe renal atrophy with multiple cysts in the kidneys bilaterally, the majority of which are simple cysts. In addition, there is a 5.7 cm Bosniak class 2 cyst in the lower pole of the left kidney (and calcifications in the wall). 9. Additional incidental findings, as above.   Electronically Signed   By: Vinnie Langton M.D.   On: 04/26/2014 16:01      Echocardiogram 03/11/2014  - Left ventricle: The cavity size was normal. There was moderate concentric hypertrophy. Wall motion was normal; there were no regional wall motion abnormalities. Doppler parameters are consistent with pseduonormal left ventricular relaxation (grade 2 diastolic dysfunction). The E/A ratio is 2.3. The E/e&' ratio is >20, suggesting markedly elevated LV filling pressure. - Aortic valve: Trileaflet bioprosthetic valve with significant degeneration; calcified leaflets with a small amount of echogenic material seen prolapsing into the LVOT with valve closure. Severe aortic stenosis - peak and mean gradients of 61 and 41 mmHg. There is moderate AI. Based on a measured LVOT diameter of 2.0 cm, the indexed AVA is 0.6 cm2/m2. The calculated AVA by continuity is higher, however, this neglects the moderate amount of aortic insufficiency. Valve area (VTI): 1.28 cm^2. Valve area (Vmean): 1.18 cm^2. - Mitral valve: Calcified leaflets with mild restriction. Mild regurgitation. Posterior calcified annulus. - Left atrium: Severely dilated at 66 ml/m2. - Right atrium: The atrium was mildly dilated. - Tricuspid valve: There was moderate regurgitation. - Pulmonary arteries: PA peak pressure: 51 mm Hg (S). - Inferior vena cava: The vessel was normal in size. The respirophasic diameter changes were in the normal range (>= 50%), consistent with normal central venous pressure.  Impressions:  - Compared with the prior study in 09/2013, there is clearly severe bioprosthetic aortic valve  stenosis. There is also moderate insufficiency and what appears to be calclified material that prolapses into the LVOT on aortic valve closure. Biventricular filling pressures are very high.    ASSESSMENT AND PLAN  1. Severe aortic regurg, h/o 25 mm Medtronic Mosaic porcine bioprosthetic tissue valve at [facility] in 2013, pending "valve in valve" TAVR  -  gated cardiac CT recommended 20 mm Edward-SAPIEN 3 valve  - admit overnight, obtain labs include CBC, CMP.   - given h/o HF and severe AR, will discuss with MD regarding lasix and fluid hydration management.   2. HTN 3. HLD 4. DM 5. CKD stage III 6. chronic diastolic HF 7. Hypothyroidism 8. thrombocytopenia followed by Dr. Benay Spice  - platelet only 31000 on 5/3, need need reassess today, likely need platelet transfusion tomorrow prior to surgery 9. PAF on amiodarone however not candidate for coumadin  Hilbert Corrigan, PA-C 05/13/2014, 4:15 PM  Patient seen, examined. Available data reviewed. Agree with findings, assessment, and plan as outlined by Almyra Deforest, PA-C. The patient is independently interviewed and examined. I agree with the physical exam as outlined above. He continues to have chronic 1+ pretibial edema bilaterally with stasis dermatitis. The abdomen is mildly distended but nontender with normal bowel sounds. Cardiopulmonary exam is unchanged. The patient will be started on gentle IV fluid hydration overnight to help prevent contrast-induced nephropathy associated with TAVR. He has been counseled extensively regarding the risks, benefits, and alternatives to transcatheter aortic valve replacement. He understands and agrees to proceed as planned tomorrow.  [name], M.D. 05/13/2014 7:34 PM

## 2014-05-13 NOTE — Progress Notes (Addendum)
Paged [place] with [facility]/Dr Burt Knack, and made her aware patient had arrived to floor, needing orders. Will monitor patient. Dorrene Bently, Bettina Gavia RN

## 2014-05-13 NOTE — Progress Notes (Signed)
PHARMACIST - PHYSICIAN ORDER COMMUNICATION  CONCERNING: P&T Medication Policy on Herbal Medications  DESCRIPTION:  This patient's order for: flax seed oil  has been noted.  This product(s) is classified as an "herbal" or natural product. Due to a lack of definitive safety studies or FDA approval, nonstandard manufacturing practices, plus the potential risk of unknown drug-drug interactions while on inpatient medications, the Pharmacy and Therapeutics Committee does not permit the use of "herbal" or natural products of this type within [facility].   ACTION TAKEN: The pharmacy department is unable to verify this order at this time and your patient has been informed of this safety policy. Please reevaluate patient's clinical condition at discharge and address if the herbal or natural product(s) should be resumed at that time. Eudelia Bunch, Pharm.D. [phone] 05/13/2014 5:25 PM

## 2014-05-13 NOTE — Progress Notes (Signed)
Patient ID: [name], male   DOB: 13-Jun-1922, 81 y.o.   MRN: [id number]  Cardiothoracic Surgery   The patient is a 81 year old gentleman with a history of hypertension, stage 3 CKD, atrial fibrillation and chronic anemia and thrombocytopenia of unclear etiology followed by Dr. Benay Spice who had an AVR in January 2013 by Dr. Evelina Dun at [facility] through a left anterior thoracotomy incision. He had a 25 mm Mosaic Ultra porcine valve. He apparently had no coronary disease at that time. He says he did well until Nov 2014 when he was admitted with fatigue, malaise and shortness of breath and had positive blood cultures growing strep bovis. 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 He had a negative orthopantogram at that time and had a CT virtual colonoscopy on 02/09/13 that showed multiple colon polyps. He said that he recovered well and was in his usual active state until early last winter when he started having exertional fatigue and some shortness of breath. This progressed and he said that he couldn't walk up any incline without giving out. He  had some left chest pains that sounded fleeting and not related to exertion. He denied dizziness and syncope. He had swelling in his legs chronically which has not changed. His echo on 12/14/2012 showed a mean gradient of 27 mm Hg across the valve with trace AI. A follow up echo on 09/21/2013 showed a mean gradient of 29 mm Hg with moderate peri-valvular AI that was very eccentric. His most recent echo of 03/11/2014 shows significant degeneration of the aortic valve prosthesis with calcified leaflets and a small amount of echogenic material prolapsing in the LVOT with valve closure. The mean gradient has increased to 41 mm Hg with moderate AI. He subsequently had a TEE by Dr. Johnsie Cancel that showed severe AR that appeared to mostly be related to perforation at the  base of the right leaflet and not perivalvular. Cardiac cath showed non-obstructive coronary disease. Gated cardiac CT has been done. He has a 25 mm medtronic mosaic ultra porcine valve and according to the valve in valve information a 23 mm S3 is probably indicated. CTA of the abdomen and pelvis shows adequate transfemoral access.  PE:  He looks frail but comfortable. Mentally sharp Lungs are clear Cardiac exam show a 3/6 systolic murmur and a 2/6 diastolic murmur along the RSB. There is mild LE edema.   I had a discussion with the patient and his family regarding what types of management strategies would be attempted intraoperatively in the event of life-threatening complications, including whether or not the patient would be considered a candidate for the use of cardiopulmonary bypass and/or conversion to open sternotomy for attempted surgical intervention. The patient has been advised of a variety of complications that might develop including but not limited to risks of death, stroke, paravalvular leak, aortic dissection or other major vascular complications, aortic annulus rupture, device embolization, cardiac rupture or perforation, mitral regurgitation, acute myocardial infarction, arrhythmia, heart block or bradycardia requiring permanent pacemaker placement, congestive heart failure, respiratory failure, renal failure, pneumonia, infection, other late complications related to structural valve deterioration or migration, or other complications that might ultimately cause a temporary or permanent loss of functional independence or other long term morbidity. The patient provides full informed consent for the procedure as described and all questions were answered.

## 2014-05-14 ENCOUNTER — Encounter (HOSPITAL_COMMUNITY)
Admission: RE | Disposition: A | Discharge status: Home / Self Care | Payer: Medicare HMO | Source: Ambulatory Visit | Attending: Cardiovascular Disease

## 2014-05-14 ENCOUNTER — Encounter (HOSPITAL_COMMUNITY): Payer: Self-pay | Admitting: Thoracic Surgery (Cardiothoracic Vascular Surgery)

## 2014-05-14 ENCOUNTER — Inpatient Hospital Stay (HOSPITAL_COMMUNITY): Payer: Commercial Managed Care - HMO

## 2014-05-14 ENCOUNTER — Other Ambulatory Visit: Payer: Self-pay

## 2014-05-14 ENCOUNTER — Inpatient Hospital Stay (HOSPITAL_COMMUNITY): Payer: Commercial Managed Care - HMO | Admitting: Anesthesiology

## 2014-05-14 DIAGNOSIS — Z952 Presence of prosthetic heart valve: Secondary | ICD-10-CM

## 2014-05-14 DIAGNOSIS — Z006 Encounter for examination for normal comparison and control in clinical research program: Secondary | ICD-10-CM

## 2014-05-14 DIAGNOSIS — I350 Nonrheumatic aortic (valve) stenosis: Secondary | ICD-10-CM

## 2014-05-14 HISTORY — PX: TEE WITHOUT CARDIOVERSION: SHX5443

## 2014-05-14 HISTORY — PX: TRANSCATHETER AORTIC VALVE REPLACEMENT, TRANSFEMORAL: SHX6400

## 2014-05-14 LAB — POCT I-STAT, CHEM 8
BUN: 76 mg/dL — ABNORMAL HIGH (ref 6–20)
BUN: 72 mg/dL — ABNORMAL HIGH (ref 6–20)
BUN: 72 mg/dL — AB (ref 6–20)
BUN: 23 mg/dL — ABNORMAL HIGH (ref 6–20)
BUN: 23 mg/dL — ABNORMAL HIGH (ref 6–20)
BUN: 22 mg/dL — AB (ref 6–20)
CALCIUM ION: 1.23 mmol/L (ref 1.13–1.30)
CALCIUM ION: 1.21 mmol/L (ref 1.13–1.30)
CALCIUM ION: 1.16 mmol/L (ref 1.13–1.30)
CALCIUM ION: 1.15 mmol/L (ref 1.13–1.30)
CALCIUM ION: 1.46 mmol/L — AB (ref 1.13–1.30)
CHLORIDE: 104 mmol/L (ref 101–111)
CHLORIDE: 101 mmol/L (ref 101–111)
CHLORIDE: 102 mmol/L (ref 101–111)
CREATININE: 2.4 mg/dL — AB (ref 0.61–1.24)
CREATININE: 1 mg/dL (ref 0.61–1.24)
Calcium, Ion: 1.22 mmol/L (ref 1.13–1.30)
Chloride: 103 mmol/L (ref 101–111)
Chloride: 104 mmol/L (ref 101–111)
Chloride: 101 mmol/L (ref 101–111)
Creatinine, Ser: 2.4 mg/dL — ABNORMAL HIGH (ref 0.61–1.24)
Creatinine, Ser: 2.4 mg/dL — ABNORMAL HIGH (ref 0.61–1.24)
Creatinine, Ser: 0.8 mg/dL (ref 0.61–1.24)
Creatinine, Ser: 0.8 mg/dL (ref 0.61–1.24)
GLUCOSE: 155 mg/dL — AB (ref 70–99)
GLUCOSE: 150 mg/dL — AB (ref 70–99)
GLUCOSE: 205 mg/dL — AB (ref 70–99)
Glucose, Bld: 157 mg/dL — ABNORMAL HIGH (ref 70–99)
Glucose, Bld: 203 mg/dL — ABNORMAL HIGH (ref 70–99)
Glucose, Bld: 206 mg/dL — ABNORMAL HIGH (ref 70–99)
HCT: 27 % — ABNORMAL LOW (ref 39.0–52.0)
HCT: 28 % — ABNORMAL LOW (ref 39.0–52.0)
HCT: 34 % — ABNORMAL LOW (ref 39.0–52.0)
HCT: 33 % — ABNORMAL LOW (ref 39.0–52.0)
HEMATOCRIT: 28 % — AB (ref 39.0–52.0)
HEMATOCRIT: 30 % — AB (ref 39.0–52.0)
HEMOGLOBIN: 9.2 g/dL — AB (ref 13.0–17.0)
HEMOGLOBIN: 9.5 g/dL — AB (ref 13.0–17.0)
HEMOGLOBIN: 9.5 g/dL — AB (ref 13.0–17.0)
Hemoglobin: 11.6 g/dL — ABNORMAL LOW (ref 13.0–17.0)
Hemoglobin: 11.2 g/dL — ABNORMAL LOW (ref 13.0–17.0)
Hemoglobin: 10.2 g/dL — ABNORMAL LOW (ref 13.0–17.0)
POTASSIUM: 4.3 mmol/L (ref 3.5–5.1)
POTASSIUM: 3.6 mmol/L (ref 3.5–5.1)
POTASSIUM: 3.7 mmol/L (ref 3.5–5.1)
Potassium: 4.1 mmol/L (ref 3.5–5.1)
Potassium: 4.2 mmol/L (ref 3.5–5.1)
Potassium: 3.7 mmol/L (ref 3.5–5.1)
Sodium: 139 mmol/L (ref 135–145)
Sodium: 138 mmol/L (ref 135–145)
Sodium: 138 mmol/L (ref 135–145)
Sodium: 138 mmol/L (ref 135–145)
Sodium: 138 mmol/L (ref 135–145)
Sodium: 138 mmol/L (ref 135–145)
TCO2: 24 mmol/L (ref 0–100)
TCO2: 23 mmol/L (ref 0–100)
TCO2: 21 mmol/L (ref 0–100)
TCO2: 24 mmol/L (ref 0–100)
TCO2: 25 mmol/L (ref 0–100)
TCO2: 22 mmol/L (ref 0–100)

## 2014-05-14 LAB — POCT I-STAT 3, ART BLOOD GAS (G3+)
ACID-BASE DEFICIT: 1 mmol/L (ref 0.0–2.0)
ACID-BASE EXCESS: 1 mmol/L (ref 0.0–2.0)
Acid-base deficit: 2 mmol/L (ref 0.0–2.0)
Acid-base deficit: 1 mmol/L (ref 0.0–2.0)
BICARBONATE: 24.7 meq/L — AB (ref 20.0–24.0)
Bicarbonate: 24.1 mEq/L — ABNORMAL HIGH (ref 20.0–24.0)
Bicarbonate: 22.4 mEq/L (ref 20.0–24.0)
Bicarbonate: 26.2 mEq/L — ABNORMAL HIGH (ref 20.0–24.0)
O2 SAT: 100 %
O2 Saturation: 100 %
O2 Saturation: 100 %
O2 Saturation: 100 %
PCO2 ART: 44.8 mmHg (ref 35.0–45.0)
PCO2 ART: 45.2 mmHg — AB (ref 35.0–45.0)
PH ART: 7.4 (ref 7.350–7.450)
PH ART: 7.375 (ref 7.350–7.450)
PH ART: 7.347 — AB (ref 7.350–7.450)
PO2 ART: 260 mmHg — AB (ref 80.0–100.0)
PO2 ART: 415 mmHg — AB (ref 80.0–100.0)
TCO2: 25 mmol/L (ref 0–100)
TCO2: 23 mmol/L (ref 0–100)
TCO2: 28 mmol/L (ref 0–100)
TCO2: 26 mmol/L (ref 0–100)
pCO2 arterial: 39.6 mmHg (ref 35.0–45.0)
pCO2 arterial: 36.1 mmHg (ref 35.0–45.0)
pH, Arterial: 7.392 (ref 7.350–7.450)
pO2, Arterial: 289 mmHg — ABNORMAL HIGH (ref 80.0–100.0)
pO2, Arterial: 189 mmHg — ABNORMAL HIGH (ref 80.0–100.0)

## 2014-05-14 LAB — CBC
HCT: 28.5 % — ABNORMAL LOW (ref 39.0–52.0)
HCT: 25.4 % — ABNORMAL LOW (ref 39.0–52.0)
Hemoglobin: 9.2 g/dL — ABNORMAL LOW (ref 13.0–17.0)
Hemoglobin: 8.3 g/dL — ABNORMAL LOW (ref 13.0–17.0)
MCH: 27.9 pg (ref 26.0–34.0)
MCH: 28.1 pg (ref 26.0–34.0)
MCHC: 32.3 g/dL (ref 30.0–36.0)
MCHC: 32.7 g/dL (ref 30.0–36.0)
MCV: 86.4 fL (ref 78.0–100.0)
MCV: 86.1 fL (ref 78.0–100.0)
PLATELETS: 36 10*3/uL — AB (ref 150–400)
Platelets: 34 10*3/uL — ABNORMAL LOW (ref 150–400)
RBC: 3.3 MIL/uL — ABNORMAL LOW (ref 4.22–5.81)
RBC: 2.95 MIL/uL — ABNORMAL LOW (ref 4.22–5.81)
RDW: 19.5 % — ABNORMAL HIGH (ref 11.5–15.5)
RDW: 19.4 % — ABNORMAL HIGH (ref 11.5–15.5)
WBC: 1.6 10*3/uL — AB (ref 4.0–10.5)
WBC: 1.6 10*3/uL — AB (ref 4.0–10.5)

## 2014-05-14 LAB — PROTIME-INR
INR: 1.34 (ref 0.00–1.49)
PROTHROMBIN TIME: 16.7 s — AB (ref 11.6–15.2)

## 2014-05-14 LAB — BASIC METABOLIC PANEL
ANION GAP: 11 (ref 5–15)
BUN: 85 mg/dL — ABNORMAL HIGH (ref 6–20)
CALCIUM: 9.1 mg/dL (ref 8.9–10.3)
CO2: 24 mmol/L (ref 22–32)
Chloride: 104 mmol/L (ref 101–111)
Creatinine, Ser: 2.63 mg/dL — ABNORMAL HIGH (ref 0.61–1.24)
GFR calc Af Amer: 23 mL/min — ABNORMAL LOW (ref >60)
GFR, EST NON AFRICAN AMERICAN: 20 mL/min — AB (ref >60)
GLUCOSE: 140 mg/dL — AB (ref 70–99)
POTASSIUM: 4.7 mmol/L (ref 3.5–5.1)
SODIUM: 139 mmol/L (ref 135–145)

## 2014-05-14 LAB — GLUCOSE, CAPILLARY
GLUCOSE-CAPILLARY: 127 mg/dL — AB (ref 70–99)
Glucose-Capillary: 127 mg/dL — ABNORMAL HIGH (ref 70–99)
Glucose-Capillary: 137 mg/dL — ABNORMAL HIGH (ref 70–99)
Glucose-Capillary: 162 mg/dL — ABNORMAL HIGH (ref 70–99)

## 2014-05-14 LAB — APTT: APTT: 40 s — AB (ref 24–37)

## 2014-05-14 LAB — PREPARE RBC (CROSSMATCH)

## 2014-05-14 SURGERY — IMPLANTATION, AORTIC VALVE, TRANSCATHETER, FEMORAL APPROACH
Anesthesia: General | Site: Groin | Laterality: Right

## 2014-05-14 MED ORDER — FENTANYL CITRATE (PF) 250 MCG/5ML IJ SOLN
INTRAMUSCULAR | Status: AC
  Filled 2014-05-14: qty 5

## 2014-05-14 MED ORDER — CEFUROXIME SODIUM 1.5 G IJ SOLR
1.5000 g | Freq: Two times a day (BID) | INTRAMUSCULAR | Status: AC
  Administered 2014-05-14 – 2014-05-16 (×4): 1.5 g via INTRAVENOUS
  Filled 2014-05-14 (×5): qty 1.5

## 2014-05-14 MED ORDER — DEXMEDETOMIDINE HCL IN NACL 200 MCG/50ML IV SOLN: 0.1000 ug/kg/h | INTRAVENOUS | Status: DC

## 2014-05-14 MED ORDER — PROTAMINE SULFATE 10 MG/ML IV SOLN
INTRAVENOUS | Status: DC | PRN
  Administered 2014-05-14 (×8): 20 mg via INTRAVENOUS

## 2014-05-14 MED ORDER — LACTATED RINGERS IV SOLN
INTRAVENOUS | Status: DC | PRN
  Administered 2014-05-14: 07:00:00 via INTRAVENOUS

## 2014-05-14 MED ORDER — PROPOFOL 10 MG/ML IV BOLUS
INTRAVENOUS | Status: DC | PRN
  Administered 2014-05-14: 30 mg via INTRAVENOUS
  Administered 2014-05-14 (×2): 10 mg via INTRAVENOUS
  Administered 2014-05-14: 30 mg via INTRAVENOUS

## 2014-05-14 MED ORDER — ALLOPURINOL 100 MG PO TABS
100.0000 mg | ORAL_TABLET | Freq: Every day | ORAL | Status: DC
  Administered 2014-05-15 – 2014-05-17 (×3): 100 mg via ORAL
  Filled 2014-05-14 (×3): qty 1

## 2014-05-14 MED ORDER — AMIODARONE HCL 200 MG PO TABS
200.0000 mg | ORAL_TABLET | Freq: Every day | ORAL | Status: DC
  Administered 2014-05-15 – 2014-05-17 (×3): 200 mg via ORAL
  Filled 2014-05-14 (×3): qty 1

## 2014-05-14 MED ORDER — FAMOTIDINE IN NACL 20-0.9 MG/50ML-% IV SOLN
20.0000 mg | Freq: Two times a day (BID) | INTRAVENOUS | Status: AC
  Administered 2014-05-14: 20 mg via INTRAVENOUS
  Filled 2014-05-14: qty 50

## 2014-05-14 MED ORDER — LIDOCAINE HCL 4 % MT SOLN
OROMUCOSAL | Status: DC | PRN
  Administered 2014-05-14: 4 mL via TOPICAL

## 2014-05-14 MED ORDER — POTASSIUM CHLORIDE CRYS ER 20 MEQ PO TBCR
20.0000 meq | EXTENDED_RELEASE_TABLET | Freq: Every day | ORAL | Status: DC
  Administered 2014-05-14 – 2014-05-17 (×4): 20 meq via ORAL
  Filled 2014-05-14 (×5): qty 1

## 2014-05-14 MED ORDER — INSULIN ASPART 100 UNIT/ML ~~LOC~~ SOLN
0.0000 [IU] | Freq: Three times a day (TID) | SUBCUTANEOUS | Status: DC
  Administered 2014-05-14: 3 [IU] via SUBCUTANEOUS

## 2014-05-14 MED ORDER — ACETAMINOPHEN 500 MG PO TABS
1000.0000 mg | ORAL_TABLET | Freq: Four times a day (QID) | ORAL | Status: DC
  Administered 2014-05-15 – 2014-05-17 (×7): 1000 mg via ORAL
  Filled 2014-05-14 (×14): qty 2

## 2014-05-14 MED ORDER — NEOSTIGMINE METHYLSULFATE 10 MG/10ML IV SOLN
INTRAVENOUS | Status: AC
  Filled 2014-05-14: qty 1

## 2014-05-14 MED ORDER — ACETAMINOPHEN 160 MG/5ML PO SOLN: 1000.0000 mg | Freq: Four times a day (QID) | ORAL | Status: DC

## 2014-05-14 MED ORDER — VANCOMYCIN HCL IN DEXTROSE 1-5 GM/200ML-% IV SOLN
1000.0000 mg | Freq: Once | INTRAVENOUS | Status: AC
  Administered 2014-05-14: 1000 mg via INTRAVENOUS
  Filled 2014-05-14: qty 200

## 2014-05-14 MED ORDER — ROCURONIUM BROMIDE 50 MG/5ML IV SOLN
INTRAVENOUS | Status: AC
  Filled 2014-05-14: qty 1

## 2014-05-14 MED ORDER — HEPARIN SODIUM (PORCINE) 1000 UNIT/ML IJ SOLN
INTRAMUSCULAR | Status: DC | PRN
  Administered 2014-05-14: 16000 [IU] via INTRAVENOUS

## 2014-05-14 MED ORDER — LIDOCAINE HCL (CARDIAC) 20 MG/ML IV SOLN
INTRAVENOUS | Status: AC
  Filled 2014-05-14: qty 5

## 2014-05-14 MED ORDER — POTASSIUM CHLORIDE 10 MEQ/50ML IV SOLN: 10.0000 meq | INTRAVENOUS | Status: DC

## 2014-05-14 MED ORDER — 0.9 % SODIUM CHLORIDE (POUR BTL) OPTIME
TOPICAL | Status: DC | PRN
  Administered 2014-05-14: 4000 mL

## 2014-05-14 MED ORDER — HEPARIN SODIUM (PORCINE) 1000 UNIT/ML IJ SOLN
INTRAMUSCULAR | Status: AC
  Filled 2014-05-14: qty 1

## 2014-05-14 MED ORDER — PANTOPRAZOLE SODIUM 40 MG PO TBEC
40.0000 mg | DELAYED_RELEASE_TABLET | Freq: Every day | ORAL | Status: DC
  Administered 2014-05-15: 40 mg via ORAL
  Filled 2014-05-14: qty 1

## 2014-05-14 MED ORDER — TRAMADOL HCL 50 MG PO TABS: 50.0000 mg | ORAL_TABLET | ORAL | Status: DC | PRN

## 2014-05-14 MED ORDER — NEOSTIGMINE METHYLSULFATE 10 MG/10ML IV SOLN
INTRAVENOUS | Status: DC | PRN
  Administered 2014-05-14: 4 mg via INTRAVENOUS

## 2014-05-14 MED ORDER — LEVOTHYROXINE SODIUM 25 MCG PO TABS
25.0000 ug | ORAL_TABLET | Freq: Every day | ORAL | Status: DC
  Administered 2014-05-15: 25 ug via ORAL
  Filled 2014-05-14 (×2): qty 1

## 2014-05-14 MED ORDER — SODIUM CHLORIDE 0.9 % IV SOLN
INTRAVENOUS | Status: DC
  Filled 2014-05-14: qty 2.5

## 2014-05-14 MED ORDER — GLYCOPYRROLATE 0.2 MG/ML IJ SOLN
INTRAMUSCULAR | Status: AC
  Filled 2014-05-14: qty 3

## 2014-05-14 MED ORDER — ARTIFICIAL TEARS OP OINT
TOPICAL_OINTMENT | OPHTHALMIC | Status: DC | PRN
  Administered 2014-05-14: 1 via OPHTHALMIC

## 2014-05-14 MED ORDER — SODIUM CHLORIDE 0.9 % IV SOLN
20.0000 mg | INTRAVENOUS | Status: DC | PRN
  Administered 2014-05-14: 10 ug/min via INTRAVENOUS

## 2014-05-14 MED ORDER — MORPHINE SULFATE 2 MG/ML IJ SOLN: 2.0000 mg | INTRAMUSCULAR | Status: DC | PRN

## 2014-05-14 MED ORDER — SUCCINYLCHOLINE CHLORIDE 20 MG/ML IJ SOLN
INTRAMUSCULAR | Status: AC
  Filled 2014-05-14: qty 1

## 2014-05-14 MED ORDER — NOREPINEPHRINE BITARTRATE 1 MG/ML IV SOLN
4000.0000 ug | INTRAVENOUS | Status: DC | PRN
  Administered 2014-05-14: 3 ug/min via INTRAVENOUS

## 2014-05-14 MED ORDER — PHENYLEPHRINE HCL 10 MG/ML IJ SOLN
0.0000 ug/min | INTRAVENOUS | Status: DC
  Filled 2014-05-14: qty 2

## 2014-05-14 MED ORDER — PROPOFOL 10 MG/ML IV BOLUS
INTRAVENOUS | Status: AC
  Filled 2014-05-14: qty 20

## 2014-05-14 MED ORDER — HYDRALAZINE HCL 20 MG/ML IJ SOLN
10.0000 mg | Freq: Four times a day (QID) | INTRAMUSCULAR | Status: DC | PRN
  Administered 2014-05-14: 10 mg via INTRAVENOUS
  Filled 2014-05-14: qty 1

## 2014-05-14 MED ORDER — ONDANSETRON HCL 4 MG/2ML IJ SOLN
INTRAMUSCULAR | Status: DC | PRN
  Administered 2014-05-14: 4 mg via INTRAVENOUS

## 2014-05-14 MED ORDER — SERTRALINE HCL 50 MG PO TABS
50.0000 mg | ORAL_TABLET | Freq: Every day | ORAL | Status: DC
  Administered 2014-05-15: 50 mg via ORAL
  Filled 2014-05-14: qty 1

## 2014-05-14 MED ORDER — HEPARIN SODIUM (PORCINE) 5000 UNIT/ML IJ SOLN
INTRAMUSCULAR | Status: DC | PRN
  Administered 2014-05-14: 1500 mL

## 2014-05-14 MED ORDER — ARTIFICIAL TEARS OP OINT
TOPICAL_OINTMENT | OPHTHALMIC | Status: AC
  Filled 2014-05-14: qty 3.5

## 2014-05-14 MED ORDER — ROCURONIUM BROMIDE 100 MG/10ML IV SOLN
INTRAVENOUS | Status: DC | PRN
  Administered 2014-05-14: 20 mg via INTRAVENOUS
  Administered 2014-05-14: 10 mg via INTRAVENOUS

## 2014-05-14 MED ORDER — IODIXANOL 320 MG/ML IV SOLN
INTRAVENOUS | Status: DC | PRN
  Administered 2014-05-14: 20 mL via INTRAVENOUS

## 2014-05-14 MED ORDER — ONDANSETRON HCL 4 MG/2ML IJ SOLN
4.0000 mg | Freq: Four times a day (QID) | INTRAMUSCULAR | Status: DC | PRN
  Administered 2014-05-14: 4 mg via INTRAVENOUS
  Filled 2014-05-14: qty 2

## 2014-05-14 MED ORDER — EPHEDRINE SULFATE 50 MG/ML IJ SOLN
INTRAMUSCULAR | Status: AC
  Filled 2014-05-14: qty 1

## 2014-05-14 MED ORDER — FENTANYL CITRATE (PF) 100 MCG/2ML IJ SOLN
INTRAMUSCULAR | Status: DC | PRN
  Administered 2014-05-14: 50 ug via INTRAVENOUS
  Administered 2014-05-14 (×2): 75 ug via INTRAVENOUS
  Administered 2014-05-14: 50 ug via INTRAVENOUS

## 2014-05-14 MED ORDER — SODIUM CHLORIDE 0.9 % IV SOLN
1.0000 mL/kg/h | INTRAVENOUS | Status: AC
  Administered 2014-05-14: 1 mL/kg/h via INTRAVENOUS

## 2014-05-14 MED ORDER — OXYCODONE HCL 5 MG PO TABS: 5.0000 mg | ORAL_TABLET | ORAL | Status: DC | PRN

## 2014-05-14 MED ORDER — ACETAMINOPHEN 650 MG RE SUPP: 650.0000 mg | Freq: Once | RECTAL | Status: DC

## 2014-05-14 MED ORDER — MIDAZOLAM HCL 2 MG/2ML IJ SOLN: 2.0000 mg | INTRAMUSCULAR | Status: DC | PRN

## 2014-05-14 MED ORDER — LACTATED RINGERS IV SOLN: 500.0000 mL | Freq: Once | INTRAVENOUS | Status: AC | PRN

## 2014-05-14 MED ORDER — ONDANSETRON HCL 4 MG/2ML IJ SOLN
INTRAMUSCULAR | Status: AC
  Filled 2014-05-14: qty 2

## 2014-05-14 MED ORDER — METOPROLOL TARTRATE 1 MG/ML IV SOLN
2.5000 mg | INTRAVENOUS | Status: DC | PRN
Start: 2014-05-14 — End: 2014-05-17

## 2014-05-14 MED ORDER — GLYCOPYRROLATE 0.2 MG/ML IJ SOLN
INTRAMUSCULAR | Status: DC | PRN
  Administered 2014-05-14: 0.6 mg via INTRAVENOUS

## 2014-05-14 MED ORDER — METOPROLOL TARTRATE 25 MG/10 ML ORAL SUSPENSION
12.5000 mg | Freq: Two times a day (BID) | ORAL | Status: DC
  Filled 2014-05-14 (×2): qty 5

## 2014-05-14 MED ORDER — INSULIN REGULAR BOLUS VIA INFUSION
0.0000 [IU] | Freq: Three times a day (TID) | INTRAVENOUS | Status: DC
  Filled 2014-05-14: qty 10

## 2014-05-14 MED ORDER — ALBUMIN HUMAN 5 % IV SOLN
250.0000 mL | INTRAVENOUS | Status: AC | PRN
  Filled 2014-05-14: qty 250

## 2014-05-14 MED ORDER — DEXMEDETOMIDINE HCL IN NACL 400 MCG/100ML IV SOLN: INTRAVENOUS | Status: DC | PRN

## 2014-05-14 MED ORDER — NITROGLYCERIN IN D5W 200-5 MCG/ML-% IV SOLN
0.0000 ug/min | INTRAVENOUS | Status: DC
  Filled 2014-05-14: qty 250

## 2014-05-14 MED ORDER — MORPHINE SULFATE 2 MG/ML IJ SOLN
1.0000 mg | INTRAMUSCULAR | Status: AC | PRN
  Administered 2014-05-14: 2 mg via INTRAVENOUS
  Filled 2014-05-14: qty 1

## 2014-05-14 MED ORDER — ACETAMINOPHEN 160 MG/5ML PO SOLN: 650.0000 mg | Freq: Once | ORAL | Status: DC

## 2014-05-14 MED ORDER — METOPROLOL TARTRATE 12.5 MG HALF TABLET
12.5000 mg | ORAL_TABLET | Freq: Two times a day (BID) | ORAL | Status: DC
  Filled 2014-05-14 (×2): qty 1

## 2014-05-14 MED ORDER — LIDOCAINE HCL (CARDIAC) 20 MG/ML IV SOLN
INTRAVENOUS | Status: DC | PRN
  Administered 2014-05-14: 80 mg via INTRAVENOUS
  Administered 2014-05-14: 60 mg via INTRAVENOUS

## 2014-05-14 MED FILL — Electrolyte-R (PH 7.4) Solution: INTRAVENOUS | Qty: 3000 | Status: AC

## 2014-05-14 MED FILL — Sodium Chloride IV Soln 0.9%: INTRAVENOUS | Qty: 2000 | Status: AC

## 2014-05-14 SURGICAL SUPPLY — 102 items
ADH SKN CLS APL DERMABOND .7 (GAUZE/BANDAGES/DRESSINGS) ×2
ATTRACTOMAT 16X20 MAGNETIC DRP (DRAPES) IMPLANT
BAG BANDED W/RUBBER/TAPE 36X54 (MISCELLANEOUS) ×4 IMPLANT
BAG DECANTER FOR FLEXI CONT (MISCELLANEOUS) ×4 IMPLANT
BAG EQP BAND 135X91 W/RBR TAPE (MISCELLANEOUS) ×2
BAG SNAP BAND KOVER 36X36 (MISCELLANEOUS) ×4 IMPLANT
BLADE 10 SAFETY STRL DISP (BLADE) IMPLANT
BLADE STERNUM SYSTEM 6 (BLADE) ×4 IMPLANT
BLADE SURG ROTATE 9660 (MISCELLANEOUS) IMPLANT
CABLE PACING FASLOC BIEGE (MISCELLANEOUS) IMPLANT
CABLE PACING FASLOC BLUE (MISCELLANEOUS) ×4 IMPLANT
CANISTER SUCTION 2500CC (MISCELLANEOUS) IMPLANT
CANNULA FEM VENOUS REMOTE 22FR (CANNULA) IMPLANT
CANNULA OPTISITE PERFUSION 16F (CANNULA) IMPLANT
CANNULA OPTISITE PERFUSION 18F (CANNULA) IMPLANT
CATH DIAG EXPO 6F AL2 (CATHETERS) ×4 IMPLANT
CATH DIAG EXPO 6F VENT PIG 145 (CATHETERS) ×8 IMPLANT
CATH EXPO 5FR AL1 (CATHETERS) ×4 IMPLANT
CATH FOLEY 16FR TEMP PROBE (CATHETERS) ×4 IMPLANT
CATH S G BIP PACING (SET/KITS/TRAYS/PACK) ×8 IMPLANT
CATH SOFT-VU 4F 65 STRAIGHT (CATHETERS) ×2 IMPLANT
CATH SOFT-VU STRAIGHT 4F 65CM (CATHETERS) ×4
CLIP TI MEDIUM 24 (CLIP) ×4 IMPLANT
CLIP TI WIDE RED SMALL 24 (CLIP) ×4 IMPLANT
CONT SPECI 4OZ STER CLIK (MISCELLANEOUS) ×12 IMPLANT
COVER DOME SNAP 22 D (MISCELLANEOUS) ×8 IMPLANT
COVER MAYO STAND STRL (DRAPES) ×4 IMPLANT
COVER TABLE BACK 60X90 (DRAPES) ×4 IMPLANT
CRADLE DONUT ADULT HEAD (MISCELLANEOUS) ×4 IMPLANT
DERMABOND ADVANCED (GAUZE/BANDAGES/DRESSINGS) ×2
DERMABOND ADVANCED .7 DNX12 (GAUZE/BANDAGES/DRESSINGS) ×2 IMPLANT
DRAPE INCISE IOBAN 66X45 STRL (DRAPES) IMPLANT
DRAPE SLUSH/WARMER DISC (DRAPES) ×4 IMPLANT
DRAPE TABLE COVER HEAVY DUTY (DRAPES) ×4 IMPLANT
DRSG TEGADERM 4X4.75 (GAUZE/BANDAGES/DRESSINGS) ×4 IMPLANT
ELECT REM PT RETURN 9FT ADLT (ELECTROSURGICAL) ×8
ELECTRODE REM PT RTRN 9FT ADLT (ELECTROSURGICAL) ×4 IMPLANT
FELT TEFLON 6X6 (MISCELLANEOUS) ×4 IMPLANT
FEMORAL VENOUS CANN RAP (CANNULA) IMPLANT
GAUZE SPONGE 4X4 12PLY STRL (GAUZE/BANDAGES/DRESSINGS) ×4 IMPLANT
GLOVE BIO SURGEON STRL SZ 6.5 (GLOVE) ×3 IMPLANT
GLOVE BIO SURGEONS STRL SZ 6.5 (GLOVE) ×1
GLOVE BIOGEL PI IND STRL 6.5 (GLOVE) ×12 IMPLANT
GLOVE BIOGEL PI INDICATOR 6.5 (GLOVE) ×12
GLOVE ECLIPSE 7.5 STRL STRAW (GLOVE) ×4 IMPLANT
GLOVE ECLIPSE 8.0 STRL XLNG CF (GLOVE) ×8 IMPLANT
GLOVE EUDERMIC 7 POWDERFREE (GLOVE) ×4 IMPLANT
GLOVE ORTHO TXT STRL SZ7.5 (GLOVE) ×4 IMPLANT
GOWN STRL REUS W/ TWL LRG LVL3 (GOWN DISPOSABLE) ×12 IMPLANT
GOWN STRL REUS W/ TWL XL LVL3 (GOWN DISPOSABLE) ×12 IMPLANT
GOWN STRL REUS W/TWL LRG LVL3 (GOWN DISPOSABLE) ×24
GOWN STRL REUS W/TWL XL LVL3 (GOWN DISPOSABLE) ×24
GUIDEWIRE SAF TJ AMPL .035X180 (WIRE) ×4 IMPLANT
GUIDEWIRE SAFE TJ AMPLATZ EXST (WIRE) ×4 IMPLANT
GUIDEWIRE STRAIGHT .035 260CM (WIRE) ×4 IMPLANT
INSERT FOGARTY 61MM (MISCELLANEOUS) ×4 IMPLANT
INSERT FOGARTY XLG (MISCELLANEOUS) IMPLANT
KIT BASIN OR (CUSTOM PROCEDURE TRAY) ×4 IMPLANT
KIT DILATOR VASC 18G NDL (KITS) IMPLANT
KIT HEART LEFT (KITS) ×4 IMPLANT
KIT ROOM TURNOVER OR (KITS) ×4 IMPLANT
KIT SUCTION CATH 14FR (SUCTIONS) ×8 IMPLANT
NEEDLE PERC 18GX7CM (NEEDLE) ×4 IMPLANT
NS IRRIG 1000ML POUR BTL (IV SOLUTION) ×12 IMPLANT
PACK AORTA (CUSTOM PROCEDURE TRAY) ×4 IMPLANT
PAD ARMBOARD 7.5X6 YLW CONV (MISCELLANEOUS) ×8 IMPLANT
PAD ELECT DEFIB RADIOL ZOLL (MISCELLANEOUS) ×4 IMPLANT
PATCH TACHOSII LRG 9.5X4.8 (VASCULAR PRODUCTS) IMPLANT
SHEATH PINNACLE 6F 10CM (SHEATH) ×8 IMPLANT
SPONGE GAUZE 4X4 12PLY STER LF (GAUZE/BANDAGES/DRESSINGS) ×4 IMPLANT
SPONGE LAP 4X18 X RAY DECT (DISPOSABLE) ×4 IMPLANT
STOPCOCK MORSE 400PSI 3WAY (MISCELLANEOUS) ×12 IMPLANT
SUT ETHIBOND X763 2 0 SH 1 (SUTURE) ×4 IMPLANT
SUT GORETEX CV 4 TH 22 36 (SUTURE) ×4 IMPLANT
SUT GORETEX TH-18 36 INCH (SUTURE) ×8 IMPLANT
SUT MNCRL AB 3-0 PS2 18 (SUTURE) ×4 IMPLANT
SUT PROLENE 3 0 SH1 36 (SUTURE) IMPLANT
SUT PROLENE 4 0 RB 1 (SUTURE) ×3
SUT PROLENE 4-0 RB1 .5 CRCL 36 (SUTURE) ×2 IMPLANT
SUT PROLENE 5 0 C 1 36 (SUTURE) ×8 IMPLANT
SUT PROLENE 6 0 C 1 30 (SUTURE) ×8 IMPLANT
SUT SILK  1 MH (SUTURE) ×4
SUT SILK 1 MH (SUTURE) ×4 IMPLANT
SUT SILK 2 0 SH CR/8 (SUTURE) IMPLANT
SUT VIC AB 2-0 CT1 27 (SUTURE) ×4
SUT VIC AB 2-0 CT1 TAPERPNT 27 (SUTURE) ×2 IMPLANT
SUT VIC AB 2-0 CTX 36 (SUTURE) IMPLANT
SUT VIC AB 3-0 SH 8-18 (SUTURE) ×8 IMPLANT
SYR 30ML LL (SYRINGE) ×8 IMPLANT
SYR 50ML LL SCALE MARK (SYRINGE) ×4 IMPLANT
SYRINGE 10CC LL (SYRINGE) ×12 IMPLANT
TAPE CLOTH SURG 4X10 WHT LF (GAUZE/BANDAGES/DRESSINGS) ×4 IMPLANT
TOWEL OR 17X26 10 PK STRL BLUE (TOWEL DISPOSABLE) ×8 IMPLANT
TRANSDUCER W/STOPCOCK (MISCELLANEOUS) ×8 IMPLANT
TRAY FOLEY IC TEMP SENS 14FR (CATHETERS) ×4 IMPLANT
TUBE SUCT INTRACARD DLP 20F (MISCELLANEOUS) IMPLANT
TUBING ART PRESS 72  MALE/FEM (TUBING) ×2
TUBING ART PRESS 72 MALE/FEM (TUBING) ×2 IMPLANT
TUBING HIGH PRESSURE 120CM (CONNECTOR) ×4 IMPLANT
VALVE HEART TRANSCATH SZ3 23MM (Prosthesis & Implant Heart) ×4 IMPLANT
WIRE AMPLATZ SS-J .035X180CM (WIRE) ×4 IMPLANT
WIRE J 3MM .035X145CM (WIRE) ×4 IMPLANT

## 2014-05-14 NOTE — Anesthesia Postprocedure Evaluation (Signed)
  Anesthesia Post-op Note  Patient: [name]  Procedure(s) Performed: Procedure(s): TRANSCATHETER AORTIC VALVE REPLACEMENT, TRANSFEMORAL (Right) TRANSESOPHAGEAL ECHOCARDIOGRAM (TEE) (N/A)  Patient Location: ICU  Anesthesia Type:General  Level of Consciousness: awake  Airway and Oxygen Therapy: Patient Spontanous Breathing  Post-op Pain: none  Post-op Assessment: Post-op Vital signs reviewed, Patient's Cardiovascular Status Stable, Respiratory Function Stable, Patent Airway, No signs of Nausea or vomiting and Pain level controlled  Post-op Vital Signs: Reviewed and stable  Last Vitals:  Filed Vitals:   05/14/14 1500  BP: 119/46  Pulse: 45  Temp: 36.1 C  Resp: 11    Complications: No apparent anesthesia complications

## 2014-05-14 NOTE — Progress Notes (Signed)
TCTS BRIEF SICU PROGRESS NOTE  Day of Surgery  S/P Procedure(s) (LRB): TRANSCATHETER AORTIC VALVE REPLACEMENT, TRANSFEMORAL (Right) TRANSESOPHAGEAL ECHOCARDIOGRAM (TEE) (N/A)   Looks good Only complaint is pain in right shoulder pain related to recent injury  NSR w/ stable BP O2 sats 100% UOP adequate Labs okay  Plan: Continue routine early post TAVR  Rexene Alberts 05/14/2014 6:39 PM

## 2014-05-14 NOTE — Interval H&P Note (Signed)
History and Physical Interval Note:  05/14/2014 7:01 AM  Nelida Meuse  has presented today for surgery, with the diagnosis of SEVERE AS  The various methods of treatment have been discussed with the patient and family. After consideration of risks, benefits and other options for treatment, the patient has consented to  Procedure(s): TRANSCATHETER AORTIC VALVE REPLACEMENT, TRANSFEMORAL (N/A) TRANSESOPHAGEAL ECHOCARDIOGRAM (TEE) (N/A) as a surgical intervention .  [name] history has been reviewed, patient examined, no change in status, stable for surgery.  I have reviewed the patient's chart and labs.  Questions were answered to the patient's satisfaction.    Pt evaluated. Appears stable and ready for TAVR via a transfemoral approach. Plan platelet transfusion   [name]

## 2014-05-14 NOTE — Op Note (Signed)
CARDIOTHORACIC SURGERY OPERATIVE NOTE  Date of Procedure:  05/14/2014  Preoperative Diagnosis: Severe Prosthetic Aortic Valve Stenosis and Regurgitation  Postoperative Diagnosis: Same   Procedure:    Transcatheter Aortic Valve Replacement - Right Transfemoral Approach  Edwards Sapien 3 Transcatheter Heart Valve (size 23 mm, model # 9600TFX, serial # [id number])   Co-Surgeons:  Gaye Pollack, MD and [name], MD  Assistants:   Valentina Gu. Roxy Manns, MD   Anesthesiologist:  Laurie Panda, MD  Echocardiographer:  Laurie Panda, MD  Pre-operative Echo Findings:  severe prosthetic valve aortic stenosis and regurgitation  normal left ventricular systolic function  Mild MR  Post-operative Echo Findings:  no paravalvular leak  normal left ventricular systolic function       DETAILS OF THE OPERATIVE PROCEDURE  The majority of the procedure is documented separately in a procedure note by Dr. Burt Knack.   TRANSFEMORAL ACCESS:   A small incision is made in the right groin immediately over the common femoral artery. The subcutaneous tissues are divided with electrocautery and the anterior surface of the common femoral artery is identified. Sharp dissection is utilized to free up the artery proximally and distally and the vessel is encircled with a vessel loop.  A pair of CV-4 Gore-tex sutures are place as diamond-shaped purse-strings on the anterior surface of the femoral artery.  The patient is heparinized systemically and ACT verified > 250 seconds.  The common femoral artery is punctured using an  18 gauge needle and a soft J-tipped guidewire is passed into the common iliac artery under fluoroscopic guidance.  A 6 Fr straight diagnostic catheter is placed over the guidewire and the guidewire is removed.  An Amplatz super stiff guidewire is passed through the sheath into the descending thoracic aorta and the introducing diagnostic catheter is removed.  Serial dilators are passed over the  guidewire under continuous fluoroscopic guidance, making certain that each dilator passes easily all of the way into the distal abdominal aorta.  A 14  Fr Burgess Estelle is passed over the guidewire into the abdominal aorta.  The introducing dilator is removed, the sheath is flushed with heparinized saline, and the sheath is secured to the skin.    FEMORAL SHEATH REMOVAL AND ARTERIAL CLOSURE:  After the completion of successful valve deployment as documented separately by Dr. Burt Knack, the femoral artery sheath is removed and the arteriotomy is closed using the previously placed Gore-tex purse-string sutures. Once the repair has been completed protamine was administered to reverse the anticoagulation. The femoral artery had no significant narrowing at the site of the arteriotomy and a good pulse distal to this. A digitally-subtracted arteriogram was not done due to the patient's renal failure. The incision is irrigated with saline solution and subsequently closed in multiple layers using absorbable suture.  The skin incision is closed using a subcuticular skin closure.     Gaye Pollack, MD 05/14/2014 10:22 AM

## 2014-05-14 NOTE — Progress Notes (Deleted)
Day of Surgery Procedure(s) (LRB): TRANSCATHETER AORTIC VALVE REPLACEMENT, TRANSFEMORAL (N/A) TRANSESOPHAGEAL ECHOCARDIOGRAM (TEE) (N/A) Subjective:  No complaints. No significant pain.  Objective: Vital signs in last 24 hours: Temp:  [97.5 F (36.4 C)-98.3 F (36.8 C)] 98.3 F (36.8 C) (05/06 0416) Pulse Rate:  [46-54] 52 (05/06 0728) Cardiac Rhythm:  [-] Sinus bradycardia (05/05 1950) Resp:  [9-20] 14 (05/06 0728) BP: (148-166)/(33-41) 156/38 mmHg (05/06 0416) SpO2:  [98 %-100 %] 100 % (05/06 0728) Weight:  [81.149 kg (178 lb 14.4 oz)] 81.149 kg (178 lb 14.4 oz) (05/06 0416)  Hemodynamic parameters for last 24 hours: PAP: (10-33)/(-18-14) 23/4 mmHg  Intake/Output from previous day: 05/05 0701 - 05/06 0700 In: 360 [P.O.:360] Out: 640 [Urine:640] Intake/Output this shift:    General appearance: alert and cooperative Neurologic: intact Heart: regular rate and rhythm, S1, S2 normal, no murmur, click, rub or gallop Lungs: clear to auscultation bilaterally Extremities: edema mild Wound: dressing dry  Lab Results:  Recent Labs  05/13/14 2145 05/14/14 0339  WBC 1.9* 1.6*  HGB 10.0* 9.2*  HCT 30.9* 28.5*  PLT 36* 34*   BMET:  Recent Labs  05/13/14 2145 05/14/14 0339  NA 138 139  K 4.7 4.7  CL 103 104  CO2 26 24  GLUCOSE 141* 140*  BUN 82* 85*  CREATININE 2.64* 2.63*  CALCIUM 9.0 9.1    PT/INR:  Recent Labs  05/13/14 2145  LABPROT 15.7*  INR 1.23   ABG    Component Value Date/Time   PHART 7.413 05/13/2014 2000   HCO3 22.8 05/13/2014 2000   TCO2 24.0 05/13/2014 2000   ACIDBASEDEF 1.1 05/13/2014 2000   O2SAT 98.7 05/13/2014 2000   CBG (last 3)   Recent Labs  05/13/14 1608 05/13/14 2046 05/14/14 0602  GLUCAP 125* 120* 127*    Assessment/Plan: S/P Procedure(s) (LRB): TRANSCATHETER AORTIC VALVE REPLACEMENT, TRANSFEMORAL (N/A) TRANSESOPHAGEAL ECHOCARDIOGRAM (TEE) (N/A)  He is hemodynamically stable Still has some chest tube output that is  dark venous and some clots. His Hgb is stable. Will keep chest tubes in for now and dangle more today. Mobilize out of bed. Remove swan and A-line. See progression orders   LOS: 1 day    Gaye Pollack 05/14/2014

## 2014-05-14 NOTE — Op Note (Signed)
[facility]  TAVR OPERATIVE NOTE   Date of Procedure:  05/14/2014  Preoperative Diagnosis: Severe Aortic Stenosis   Postoperative Diagnosis: Same   Procedure:    Transcatheter Aortic Valve Replacement - Transfemoral Approach  Edwards Sapien 3 THV (size 23 mm, model # 9600TFX, serial # [id number])   Co-Surgeons:  Gilford Raid, MD and [name], MD  Assistants:   Darylene Price, MD   Anesthesiologist:  Laurie Panda, MD  Echocardiographer:  Laurie Panda, MD  Pre-operative Echo Findings:  Severe aortic stenosis and AI of existing bioprosthesis  normal left ventricular systolic function  Post-operative Echo Findings:  No paravalvular leak  Normal left ventricular systolic function  BRIEF CLINICAL NOTE AND INDICATIONS FOR SURGERY [name] is a 81 y.o. male who presents for TAVR for treatment of severe symptomatic bioprosthetic aortic valve insufficiency. He was hospitalized yesterday for preprocedure hydration in the setting of advanced kidney disease.  The patient underwent aortic valve replacement using a 25 mm Medtronic Mosaic porcine bioprosthetic tissue valve via right anterior mini thoracotomy approach by Dr. Evelina Dun at [facility] in January 2013. He initially had moderate AI and there was concern of paravalvular leak. He developed Group D Streptococcal bacteremia in 2014 but TEE did not show evidence of significant aortic valve insufficiency or vegetation. He has gone on to develop progressive bioprosthetic valve insufficiency and stenosis with worsening leaflet dysfunction on subsequent echo studies. He has now undergone extensive evaluation for 'valve-in-valve' TAVR and presents today for further discussion.   During the course of the patient's preoperative work up they have been evaluated comprehensively by a multidisciplinary team of specialists coordinated through the [facility] in the [facility].  They have been demonstrated to suffer from symptomatic severe aortic stenosis as noted above. The patient has been counseled extensively as to the relative risks and benefits of all options for the treatment of severe aortic stenosis including long term medical therapy, conventional surgery for aortic valve replacement, and transcatheter aortic valve replacement.  The patient has been independently evaluated by two cardiac surgeons including Dr Roxy Manns and Dr. [name], and they are felt to be at high risk for conventional surgical aortic valve replacement based upon a predicted risk of mortality using the Society of Thoracic Surgeons risk calculator of 13.6%. Both surgeons indicated the patient would be a poor candidate for conventional surgery (predicted risk of mortality >15% and/or predicted risk of permanent morbidity >50%) because of comorbidities including advanced age, previous cardiac surgery, reduced functional capacity, chronic pancytopenia/myelodysplasia.   Based upon review of all of the patient's preoperative diagnostic tests they are felt to be candidate for transcatheter aortic valve replacement using the transfemoral approach as an alternative to high risk conventional surgery.    Following the decision to proceed with transcatheter aortic valve replacement, a discussion has been held regarding what types of management strategies would be attempted intraoperatively in the event of life-threatening complications, including whether or not the patient would be considered a candidate for the use of cardiopulmonary bypass and/or conversion to open sternotomy for attempted surgical intervention.  The patient has been advised of a variety of complications that might develop peculiar to this approach including but not limited to risks of death, stroke, paravalvular leak, aortic dissection or other major vascular complications, aortic annulus rupture, device embolization, cardiac rupture or  perforation, acute myocardial infarction, arrhythmia, heart block or bradycardia requiring permanent pacemaker placement, congestive heart failure, respiratory  failure, renal failure, pneumonia, infection, other late complications related to structural valve deterioration or migration, or other complications that might ultimately cause a temporary or permanent loss of functional independence or other long term morbidity.  The patient provides full informed consent for the procedure as described and all questions were answered preoperatively.    DETAILS OF THE OPERATIVE PROCEDURE  PREPARATION:    The patient is brought to the operating room on the above mentioned date and central monitoring was established by the anesthesia team including placement of Swan-Ganz catheter and radial arterial line. The patient is placed in the supine position on the operating table.  Intravenous antibiotics are administered. General endotracheal anesthesia is induced uneventfully. A Foley catheter is placed.  Baseline transesophageal echocardiogram was performed. [name] chest, abdomen, both groins, and both lower extremities are prepared and draped in a sterile manner. A time out procedure is performed.   PERIPHERAL ACCESS:    Using the modified Seldinger technique, femoral arterial and venous access was obtained with placement of 6 Fr sheaths on the left side.  A pigtail diagnostic catheter was passed through the left femoral arterial sheath under fluoroscopic guidance into the aortic root.  A temporary transvenous pacemaker catheter was passed through the left femoral venous sheath under fluoroscopic guidance into the right ventricle.  The pacemaker was tested to ensure stable lead placement and pacemaker capture. Aortic root angiography was performed in order to determine the optimal angiographic angle for valve deployment.   TRANSFEMORAL ACCESS:   A right femoral arterial cutdown was performed by Dr  [name]. Please see his separate operative note for details. The patient was heparinized systemically and ACT verified > 250 seconds.    A 14 Fr transfemoral E-sheath was introduced into the right femoral artery after progressively dilating over an Amplatz superstiff wire. An AL-2 catheter was used to direct a straight-tip exchange length wire across the native aortic valve into the left ventricle. This was exchanged out for a pigtail catheter and position was confirmed in the LV apex. Simultaneous LV and Ao pressures were recorded.  The pigtail catheter was then exchanged for an Amplatz Extra-stiff wire in the LV apex.  TRANSCATHETER HEART VALVE DEPLOYMENT:  An Edwards Sapien 3 THV (size 23 mm) was prepared and crimped per manufacturer's guidelines, and the proper orientation of the valve is confirmed on the [employer] delivery system. An S3 valve was chosen because there was an eccentric AI jet that was very difficult to characterize and we suspected both paravalvular regurgitation and intravalvular regurgitation. We felt the S3 'skirt' would liekly provide a better seal at the annulus if there was a significant component of paravalvular leak. The valve was advanced through the introducer sheath using normal technique until in an appropriate position in the abdominal aorta beyond the sheath tip. The balloon was then retracted and using the fine-tuning wheel was centered on the valve. The valve was then advanced across the aortic arch using appropriate flexion of the catheter. The valve was carefully positioned across the aortic valve annulus. The Commander catheter was retracted using normal technique. Once final position of the valve has been confirmed by angiographic assessment, the valve is deployed while temporarily holding ventilation and during rapid ventricular pacing to maintain systolic blood pressure < 50 mmHg and pulse pressure < 10 mmHg. The balloon inflation is held for >3 seconds after  reaching full deployment volume. Once the balloon has fully deflated the balloon is retracted into the ascending aorta and valve  function is assessed using TEE. There is felt to be no paravalvular leak and no central aortic insufficiency.  [name] hemodynamic recovery following valve deployment is good.  The deployment balloon and guidewire are both removed. Echo demostrated acceptable post-procedural gradients, stable mitral valve function, and no AI.   PROCEDURE COMPLETION:  The sheath was then removed and arteriotomy repaired by [name]. Please see his separate report for details. Distal abdominal aortography was performed to evaluate for any arterial injury related to the procedure. The temporary pacemaker, pigtail catheters and femoral sheaths were removed with manual pressure used for hemostasis.   The patient tolerated the procedure well and is transported to the surgical intensive care in stable condition. There were no immediate intraoperative complications. All sponge instrument and needle counts are verified correct at completion of the operation.   Platelets were administered during the surgery because of marked thrombocytopenia with plt count approximately 30,000  The patient received a total of 20 mL of intravenous contrast during the procedure.  [name] MD 05/14/2014 9:46 AM

## 2014-05-14 NOTE — Transfer of Care (Signed)
Immediate Anesthesia Transfer of Care Note  Patient: [name]  Procedure(s) Performed: Procedure(s): TRANSCATHETER AORTIC VALVE REPLACEMENT, TRANSFEMORAL (Right) TRANSESOPHAGEAL ECHOCARDIOGRAM (TEE) (N/A)  Patient Location: SICU  Anesthesia Type:General  Level of Consciousness: awake and alert   Airway & Oxygen Therapy: Patient Spontanous Breathing and Patient connected to nasal cannula oxygen  Post-op Assessment: Report given to RN, Post -op Vital signs reviewed and stable and Patient moving all extremities  Post vital signs: Reviewed and stable  Last Vitals:  Filed Vitals:   05/14/14 0728  BP:   Pulse:   Temp:   Resp: 14    Complications: No apparent anesthesia complications

## 2014-05-14 NOTE — Progress Notes (Signed)
Ur ins review done.

## 2014-05-14 NOTE — Anesthesia Procedure Notes (Addendum)
Procedure Name: Intubation Performed by: Suzy Bouchard Pre-anesthesia Checklist: Patient identified, Emergency Drugs available, Suction available, Patient being monitored and Timeout performed Patient Re-evaluated:Patient Re-evaluated prior to inductionOxygen Delivery Method: Circle system utilized Preoxygenation: Pre-oxygenation with 100% oxygen Intubation Type: IV induction Ventilation: Mask ventilation without difficulty Laryngoscope Size: Mac and 4 Grade View: Grade I Tube type: Oral Tube size: 7.5 mm Number of attempts: 1 Airway Equipment and Method: Stylet Placement Confirmation: ETT inserted through vocal cords under direct vision Secured at: 22 cm Tube secured with: Tape Dental Injury: Teeth and Oropharynx as per pre-operative assessment

## 2014-05-14 NOTE — Anesthesia Preprocedure Evaluation (Signed)
Anesthesia Evaluation  Patient identified by MRN, date of birth, ID band Patient awake    Reviewed: Allergy & Precautions, NPO status , Patient's Chart, lab work & pertinent test results, reviewed documented beta blocker date and time   History of Anesthesia Complications Negative for: history of anesthetic complications  Airway Mallampati: I  TM Distance: >3 FB Neck ROM: Full    Dental  (+) Edentulous Upper,    Pulmonary shortness of breath, neg sleep apnea, neg COPDneg recent URI, former smoker,  breath sounds clear to auscultation        Cardiovascular hypertension, Pt. on medications and Pt. on home beta blockers + CAD, + Peripheral Vascular Disease and +CHF + Valvular Problems/Murmurs AI and AS Rhythm:Regular Rate:Bradycardia + Systolic murmurs    Neuro/Psych PSYCHIATRIC DISORDERS Anxiety Depression negative neurological ROS     GI/Hepatic Neg liver ROS, hiatal hernia, GERD-  Medicated and Controlled,  Endo/Other  diabetesHypothyroidism   Renal/GU Renal Insufficiency and CRFRenal disease     Musculoskeletal  (+) Arthritis -,   Abdominal   Peds  Hematology  (+) anemia ,   Anesthesia Other Findings   Reproductive/Obstetrics                             Anesthesia Physical Anesthesia Plan  ASA: IV  Anesthesia Plan: General   Post-op Pain Management:    Induction: Intravenous  Airway Management Planned: Oral ETT  Additional Equipment: Arterial line, TEE, CVP, PA Cath, Ultrasound Guidance Line Placement and 3D TEE  Intra-op Plan:   Post-operative Plan: Extubation in OR and Possible Post-op intubation/ventilation  Informed Consent: I have reviewed the patients History and Physical, chart, labs and discussed the procedure including the risks, benefits and alternatives for the proposed anesthesia with the patient or authorized representative who has indicated his/her understanding  and acceptance.   Dental advisory given  Plan Discussed with: CRNA and Surgeon  Anesthesia Plan Comments:         Anesthesia Quick Evaluation

## 2014-05-14 NOTE — Progress Notes (Signed)
  Echocardiogram Echocardiogram Transesophageal has been performed.  [name] 05/14/2014, 12:31 PM

## 2014-05-15 ENCOUNTER — Encounter (HOSPITAL_COMMUNITY): Payer: Self-pay

## 2014-05-15 ENCOUNTER — Inpatient Hospital Stay (HOSPITAL_COMMUNITY): Payer: Commercial Managed Care - HMO

## 2014-05-15 LAB — CBC
HEMATOCRIT: 27.9 % — AB (ref 39.0–52.0)
Hemoglobin: 8.8 g/dL — ABNORMAL LOW (ref 13.0–17.0)
MCH: 27.6 pg (ref 26.0–34.0)
MCHC: 31.5 g/dL (ref 30.0–36.0)
MCV: 87.5 fL (ref 78.0–100.0)
Platelets: 43 10*3/uL — ABNORMAL LOW (ref 150–400)
RBC: 3.19 MIL/uL — ABNORMAL LOW (ref 4.22–5.81)
RDW: 19.6 % — AB (ref 11.5–15.5)
WBC: 1.7 10*3/uL — ABNORMAL LOW (ref 4.0–10.5)

## 2014-05-15 LAB — HEMOGLOBIN A1C
Hgb A1c MFr Bld: 6 % — ABNORMAL HIGH (ref 4.8–5.6)
Mean Plasma Glucose: 126 mg/dL

## 2014-05-15 LAB — BASIC METABOLIC PANEL
Anion gap: 7 (ref 5–15)
BUN: 69 mg/dL — AB (ref 6–20)
CO2: 24 mmol/L (ref 22–32)
Calcium: 8.6 mg/dL — ABNORMAL LOW (ref 8.9–10.3)
Chloride: 106 mmol/L (ref 101–111)
Creatinine, Ser: 2.29 mg/dL — ABNORMAL HIGH (ref 0.61–1.24)
GFR calc Af Amer: 27 mL/min — ABNORMAL LOW (ref >60)
GFR calc non Af Amer: 24 mL/min — ABNORMAL LOW (ref >60)
Glucose, Bld: 131 mg/dL — ABNORMAL HIGH (ref 70–99)
POTASSIUM: 4.3 mmol/L (ref 3.5–5.1)
Sodium: 137 mmol/L (ref 135–145)

## 2014-05-15 LAB — PREPARE PLATELET PHERESIS
UNIT DIVISION: 0
Unit division: 0

## 2014-05-15 LAB — GLUCOSE, CAPILLARY
GLUCOSE-CAPILLARY: 116 mg/dL — AB (ref 70–99)
GLUCOSE-CAPILLARY: 153 mg/dL — AB (ref 70–99)
GLUCOSE-CAPILLARY: 159 mg/dL — AB (ref 70–99)
Glucose-Capillary: 147 mg/dL — ABNORMAL HIGH (ref 70–99)

## 2014-05-15 LAB — MAGNESIUM: Magnesium: 2.2 mg/dL (ref 1.7–2.4)

## 2014-05-15 MED ORDER — SODIUM CHLORIDE 0.9 % IJ SOLN: 3.0000 mL | INTRAMUSCULAR | Status: DC | PRN

## 2014-05-15 MED ORDER — ALLOPURINOL 100 MG PO TABS: 100.0000 mg | ORAL_TABLET | Freq: Every day | ORAL | Status: DC

## 2014-05-15 MED ORDER — SODIUM CHLORIDE 0.9 % IV SOLN: 250.0000 mL | INTRAVENOUS | Status: DC | PRN

## 2014-05-15 MED ORDER — ASPIRIN EC 81 MG PO TBEC: 81.0000 mg | DELAYED_RELEASE_TABLET | Freq: Every day | ORAL | Status: DC

## 2014-05-15 MED ORDER — INSULIN ASPART 100 UNIT/ML ~~LOC~~ SOLN
0.0000 [IU] | SUBCUTANEOUS | Status: DC
  Administered 2014-05-15 – 2014-05-17 (×9): 2 [IU] via SUBCUTANEOUS

## 2014-05-15 MED ORDER — PANTOPRAZOLE SODIUM 40 MG PO TBEC
40.0000 mg | DELAYED_RELEASE_TABLET | Freq: Every day | ORAL | Status: DC
  Administered 2014-05-16 – 2014-05-17 (×2): 40 mg via ORAL
  Filled 2014-05-15 (×2): qty 1

## 2014-05-15 MED ORDER — SODIUM CHLORIDE 0.9 % IJ SOLN
3.0000 mL | Freq: Two times a day (BID) | INTRAMUSCULAR | Status: DC
  Administered 2014-05-15 – 2014-05-17 (×5): 3 mL via INTRAVENOUS

## 2014-05-15 MED ORDER — MOVING RIGHT ALONG BOOK
Freq: Once | Status: DC
  Filled 2014-05-15: qty 1

## 2014-05-15 MED ORDER — INSULIN ASPART 100 UNIT/ML ~~LOC~~ SOLN: 0.0000 [IU] | SUBCUTANEOUS | Status: DC

## 2014-05-15 MED ORDER — LEVOTHYROXINE SODIUM 25 MCG PO TABS
25.0000 ug | ORAL_TABLET | Freq: Every day | ORAL | Status: DC
  Administered 2014-05-16 – 2014-05-17 (×2): 25 ug via ORAL
  Filled 2014-05-15 (×4): qty 1

## 2014-05-15 MED ORDER — ASPIRIN 325 MG PO TABS
325.0000 mg | ORAL_TABLET | Freq: Every day | ORAL | Status: DC
  Administered 2014-05-15 – 2014-05-17 (×3): 325 mg via ORAL
  Filled 2014-05-15 (×3): qty 1

## 2014-05-15 MED ORDER — FUROSEMIDE 40 MG PO TABS
40.0000 mg | ORAL_TABLET | Freq: Every day | ORAL | Status: DC
  Administered 2014-05-15 – 2014-05-17 (×3): 40 mg via ORAL
  Filled 2014-05-15 (×3): qty 1

## 2014-05-15 MED ORDER — SERTRALINE HCL 50 MG PO TABS
50.0000 mg | ORAL_TABLET | Freq: Every day | ORAL | Status: DC
  Administered 2014-05-16 – 2014-05-17 (×2): 50 mg via ORAL
  Filled 2014-05-15 (×3): qty 1

## 2014-05-15 MED ORDER — AMLODIPINE BESYLATE 5 MG PO TABS
5.0000 mg | ORAL_TABLET | Freq: Every day | ORAL | Status: DC
  Administered 2014-05-15 – 2014-05-17 (×3): 5 mg via ORAL
  Filled 2014-05-15 (×3): qty 1

## 2014-05-15 NOTE — Progress Notes (Signed)
Patient has arrived to 2W room 2W35. Patient alert and oriented x4. Patient has no complaints at this time. Vitals stable. Patient oriented to unit. Call bell in reach. Will monitor.

## 2014-05-15 NOTE — Progress Notes (Signed)
Subjective:  Feeling better followingTAVR yesterday.  No c/o SOB.  Complaints of mild right shoulder pain as well as pain at the site of the arteriotomy.  Objective:  Vital Signs in the last 24 hours: BP 131/60 mmHg  Pulse 52  Temp(Src) 96.2 F (35.7 C) (Axillary)  Resp 12  Ht 5\' 10"  (1.778 m)  Wt 83.7 kg (184 lb 8.4 oz)  BMI 26.48 kg/m2  SpO2 100%  Physical Exam: Pleasant elderly white male in no acute distress Lungs:  Clear  Cardiac:  Regular rhythm, normal S1 and S2, no S3, harsh 2/6 systolic murmur over aortic valve Abdomen:  Soft, nontender, no masses Extremities:  1-2 + edema, marked bilateral venous varicosities and venous insufficiency noted.    Intake/Output from previous day: 05/06 0701 - 05/07 0700 In: 2858.5 [P.O.:340; I.V.:2197.5; Blood:221; IV Piggyback:100] Out: 2080 [Urine:2080] Weight Filed Weights   05/14/14 0416 05/14/14 1045 05/15/14 0630  Weight: 81.149 kg (178 lb 14.4 oz) 81 kg (178 lb 9.2 oz) 83.7 kg (184 lb 8.4 oz)    Lab Results: Basic Metabolic Panel:  Recent Labs  05/14/14 0339  05/14/14 1253 05/15/14 0359  NA 139  < > 138 137  K 4.7  < > 3.7 4.3  CL 104  < > 101 106  CO2 24  --   --  24  GLUCOSE 140*  < > 206* 131*  BUN 85*  < > 22* 69*  CREATININE 2.63*  < > 1.00 2.29*  < > = values in this interval not displayed.  CBC:  Recent Labs  05/13/14 1751  05/14/14 1100  05/14/14 1253 05/15/14 0359  WBC 2.0*  < > 1.6*  --   --  1.7*  NEUTROABS 1.5*  --   --   --   --   --   HGB 10.7*  < > 8.3*  < > 10.2* 8.8*  HCT 32.6*  < > 25.4*  < > 30.0* 27.9*  MCV 86.7  < > 86.1  --   --  87.5  PLT 39*  < > 36*  --   --  43*  < > = values in this interval not displayed.  BNP    Component Value Date/Time   BNP 642.8* 05/13/2014 1751    PROTIME: Lab Results  Component Value Date   INR 1.34 05/14/2014   INR 1.23 05/13/2014   INR 1.17 05/13/2014    Telemetry:  Normal sinus rhythm  Assessment/Plan:  1.  Status post TAVR and  currently doing well 2.  Stage III chronic kidney disease similar to preoperative values now 3.  History of paroxysmal atrial fibrillation 4.  Severe thrombocytopenia  Conditions:  Clinically significantly improved.  To transfer to floor today.     Kerry Hough  MD [facility] Card[facility]  05/15/2014, 10:46 AM

## 2014-05-15 NOTE — Progress Notes (Addendum)
[address]       [place] [address]             [phone]        CARDIOTHORACIC SURGERY PROGRESS NOTE   R1 Day Post-Op Procedure(s) (LRB): TRANSCATHETER AORTIC VALVE REPLACEMENT, TRANSFEMORAL (Right) TRANSESOPHAGEAL ECHOCARDIOGRAM (TEE) (N/A)  Subjective: Looks great.  Only complaint is pain in right shoulder.  Ambulated around SICU already this morning  Objective: Vital signs: BP Readings from Last 1 Encounters:  05/15/14 113/62   Pulse Readings from Last 1 Encounters:  05/15/14 48   Resp Readings from Last 1 Encounters:  05/15/14 15   Temp Readings from Last 1 Encounters:  05/15/14 96.2 F (35.7 C) Axillary    Hemodynamics: PAP: (26-44)/(9-15) 33/12 mmHg  Physical Exam:  Rhythm:   Sinus 50's  Breath sounds: clear  Heart sounds:  RRR w/out murmur  Incisions:  Clean and dry  Abdomen:  Soft, non-distended, non-tender  Extremities:  Warm, well-perfused    Intake/Output from previous day: 05/06 0701 - 05/07 0700 In: 2858.5 [P.O.:340; I.V.:2197.5; Blood:221; IV Piggyback:100] Out: 2080 [Urine:2080] Intake/Output this shift: Total I/O In: 410 [P.O.:360; IV Piggyback:50] Out: 75 [Urine:75]  Lab Results:  CBC: Recent Labs  05/14/14 1100  05/14/14 1253 05/15/14 0359  WBC 1.6*  --   --  1.7*  HGB 8.3*  < > 10.2* 8.8*  HCT 25.4*  < > 30.0* 27.9*  PLT 36*  --   --  43*  < > = values in this interval not displayed.  BMET:  Recent Labs  05/14/14 0339  05/14/14 1253 05/15/14 0359  NA 139  < > 138 137  K 4.7  < > 3.7 4.3  CL 104  < > 101 106  CO2 24  --   --  24  GLUCOSE 140*  < > 206* 131*  BUN 85*  < > 22* 69*  CREATININE 2.63*  < > 1.00 2.29*  CALCIUM 9.1  --   --  8.6*  < > = values in this interval not displayed.   CBG (last 3)   Recent Labs  05/14/14 1943 05/15/14 0001 05/15/14 0840  GLUCAP 127* 159* 147*    ABG    Component Value Date/Time   PHART 7.347* 05/14/2014 1256   PCO2ART 45.2* 05/14/2014 1256    PO2ART 189.0* 05/14/2014 1256   HCO3 24.7* 05/14/2014 1256   TCO2 26 05/14/2014 1256   ACIDBASEDEF 1.0 05/14/2014 1256   O2SAT 100.0 05/14/2014 1256    EKG: NSR w/out acute ischemic changes, no significant AV block   CXR: PORTABLE CHEST - 1 VIEW  COMPARISON: 05/14/2014  FINDINGS: The Swan-Ganz catheter has been removed. The right jugular sheath remains. There is unchanged cardiomegaly and aortic tortuosity. There are calcified granulomatous changes. The lungs are otherwise clear. There is no pneumothorax.  IMPRESSION: Swan-Ganz catheter removal. Unchanged cardiomegaly. No acute cardiopulmonary findings.   Electronically Signed  By: Andreas Newport M.D.  On: 05/15/2014 05:58  Assessment/Plan: S/P Procedure(s) (LRB): TRANSCATHETER AORTIC VALVE REPLACEMENT, TRANSFEMORAL (Right) TRANSESOPHAGEAL ECHOCARDIOGRAM (TEE) (N/A)  Overall doing very well POD1 TAVR valve-in-valve Maintaining NSR w/ stable hemodynamics H/O pancytopenia pre-op, platelet count increased today, Hgb and WBC stable Hypertension Chronic diastolic CHF, normal LV systolic function CKD, creatinine stable Recent right shoulder injury   Mobilize  D/C lines and foley  Restart ASA but will not use DAPT due to thrombocytopenia  Restart Norvasc for HTN  Gentle diuresis  Routine POD1  ECHO today  Transfer 2W  I spent in excess of 15 minutes during the conduct of this hospital encounter and >50% of this time involved direct face-to-face encounter with the patient for counseling and/or coordination of their care.   [name] 05/15/2014 9:32 AM

## 2014-05-15 NOTE — Progress Notes (Signed)
CARDIAC REHAB PHASE I   PRE:  Rate/Rhythm: SB 55  BP:  Supine:   Sitting: 140/52  Standing:    SaO2: 98 RA  MODE:  Ambulation: 400 ft   POST:  Rate/Rhythm: 60  BP:  Supine:   Sitting: 170/54 rechecked x 2  Standing:    SaO2: 99 RA  Pt up to ambulate x 1 assist and RW.  Pt gait is strong and steady.  Pt returned to bed, post ambulation bp elevated.  Informed primary RN of his bp.  Family at bedside, call bell in place. Cherre Huger, BSN

## 2014-05-16 LAB — CBC
HCT: 26.9 % — ABNORMAL LOW (ref 39.0–52.0)
HEMOGLOBIN: 8.5 g/dL — AB (ref 13.0–17.0)
MCH: 27.9 pg (ref 26.0–34.0)
MCHC: 31.6 g/dL (ref 30.0–36.0)
MCV: 88.2 fL (ref 78.0–100.0)
Platelets: 38 10*3/uL — ABNORMAL LOW (ref 150–400)
RBC: 3.05 MIL/uL — AB (ref 4.22–5.81)
RDW: 19.5 % — ABNORMAL HIGH (ref 11.5–15.5)
WBC: 1.5 10*3/uL — ABNORMAL LOW (ref 4.0–10.5)

## 2014-05-16 LAB — BASIC METABOLIC PANEL
ANION GAP: 8 (ref 5–15)
BUN: 65 mg/dL — AB (ref 6–20)
CHLORIDE: 107 mmol/L (ref 101–111)
CO2: 23 mmol/L (ref 22–32)
Calcium: 8.6 mg/dL — ABNORMAL LOW (ref 8.9–10.3)
Creatinine, Ser: 2.39 mg/dL — ABNORMAL HIGH (ref 0.61–1.24)
GFR, EST AFRICAN AMERICAN: 26 mL/min — AB (ref >60)
GFR, EST NON AFRICAN AMERICAN: 22 mL/min — AB (ref >60)
Glucose, Bld: 139 mg/dL — ABNORMAL HIGH (ref 70–99)
Potassium: 4.5 mmol/L (ref 3.5–5.1)
Sodium: 138 mmol/L (ref 135–145)

## 2014-05-16 LAB — GLUCOSE, CAPILLARY
GLUCOSE-CAPILLARY: 104 mg/dL — AB (ref 70–99)
Glucose-Capillary: 134 mg/dL — ABNORMAL HIGH (ref 70–99)
Glucose-Capillary: 160 mg/dL — ABNORMAL HIGH (ref 70–99)
Glucose-Capillary: 136 mg/dL — ABNORMAL HIGH (ref 70–99)

## 2014-05-16 MED ORDER — LACTULOSE 10 GM/15ML PO SOLN
20.0000 g | Freq: Once | ORAL | Status: AC
  Administered 2014-05-16: 20 g via ORAL
  Filled 2014-05-16: qty 30

## 2014-05-16 NOTE — Progress Notes (Signed)
Subjective:  Now on floor.  No c/o SOB.  Complaints of mild pain at the site of the arteriotomy.  Objective:  Vital Signs in the last 24 hours: BP 135/53 mmHg  Pulse 56  Temp(Src) 98.9 F (37.2 C) (Oral)  Resp 18  Ht 5\' 10"  (1.778 m)  Wt 84.3 kg (185 lb 13.6 oz)  BMI 26.67 kg/m2  SpO2 98%  Physical Exam: Pleasant elderly white male in no acute distress Lungs:  Clear  Cardiac:  Regular rhythm, normal S1 and S2, no S3, harsh 2/6 systolic murmur over aortic valve Abdomen:  Soft, nontender, no masses Extremities:  1-2 + edema, marked bilateral venous varicosities and venous insufficiency noted.    Intake/Output from previous day: 05/07 0701 - 05/08 0700 In: 410 [P.O.:360; IV Piggyback:50] Out: 685 [Urine:685] Weight Filed Weights   05/14/14 1045 05/15/14 0630 05/16/14 0423  Weight: 81 kg (178 lb 9.2 oz) 83.7 kg (184 lb 8.4 oz) 84.3 kg (185 lb 13.6 oz)    Lab Results: Basic Metabolic Panel:  Recent Labs  05/15/14 0359 05/16/14 0455  NA 137 138  K 4.3 4.5  CL 106 107  CO2 24 23  GLUCOSE 131* 139*  BUN 69* 65*  CREATININE 2.29* 2.39*    CBC:  Recent Labs  05/13/14 1751  05/15/14 0359 05/16/14 0455  WBC 2.0*  < > 1.7* 1.5*  NEUTROABS 1.5*  --   --   --   HGB 10.7*  < > 8.8* 8.5*  HCT 32.6*  < > 27.9* 26.9*  MCV 86.7  < > 87.5 88.2  PLT 39*  < > 43* 38*  < > = values in this interval not displayed.  BNP    Component Value Date/Time   BNP 642.8* 05/13/2014 1751    PROTIME: Lab Results  Component Value Date   INR 1.34 05/14/2014   INR 1.23 05/13/2014   INR 1.17 05/13/2014    Telemetry:  Normal sinus rhythm  Assessment/Plan:  1.  Status post TAVR and currently doing well 2.  Stage III chronic kidney disease similar to preoperative values now 3.  History of paroxysmal atrial fibrillation 4.  Severe thrombocytopenia  Conditions:  Clinically significantly improved.  Continue progress.     Kerry Hough  MD  [facility] Card[facility]  05/16/2014, 9:57 AM

## 2014-05-16 NOTE — Progress Notes (Addendum)
      [address]       [place], [address]             [phone]        2 Days Post-Op Procedure(s) (LRB): TRANSCATHETER AORTIC VALVE REPLACEMENT, TRANSFEMORAL (Right) TRANSESOPHAGEAL ECHOCARDIOGRAM (TEE) (N/A)  Subjective: Has some pain in right groin.  Objective: Vital signs in last 24 hours: Temp:  [97.9 F (36.6 C)-98.9 F (37.2 C)] 98.9 F (37.2 C) (05/08 0423) Pulse Rate:  [46-57] 56 (05/08 0423) Cardiac Rhythm:  [-] Heart block (05/08 0800) Resp:  [9-18] 18 (05/08 0423) BP: (130-163)/(46-54) 135/53 mmHg (05/08 0423) SpO2:  [98 %-100 %] 98 % (05/08 0423) Weight:  [185 lb 13.6 oz (84.3 kg)] 185 lb 13.6 oz (84.3 kg) (05/08 0423)   Current Weight  05/16/14 185 lb 13.6 oz (84.3 kg)       Intake/Output from previous day: 05/07 0701 - 05/08 0700 In: 410 [P.O.:360; IV Piggyback:50] Out: 706 [Urine:685]   Physical Exam:  Cardiovascular: RRR, grade II/VI systolic murmur Pulmonary: Clear to auscultation bilaterally; no rales, wheezes, or rhonchi. Abdomen: Soft, non tender, bowel sounds present. Extremities: Blower extremity and ankle edema. Chronic venous stasis changes. Wounds: Clean and dry.  No erythema or signs of infection. Ecchymosis R>Lgroin.  Lab Results: CBC: Recent Labs  05/15/14 0359 05/16/14 0455  WBC 1.7* 1.5*  HGB 8.8* 8.5*  HCT 27.9* 26.9*  PLT 43* 38*   BMET:  Recent Labs  05/15/14 0359 05/16/14 0455  NA 137 138  K 4.3 4.5  CL 106 107  CO2 24 23  GLUCOSE 131* 139*  BUN 69* 65*  CREATININE 2.29* 2.39*  CALCIUM 8.6* 8.6*    PT/INR:  Lab Results  Component Value Date   INR 1.34 05/14/2014   INR 1.23 05/13/2014   INR 1.17 05/13/2014   ABG:  INR: Will add last result for INR, ABG once components are confirmed Will add last 4 CBG results once components are confirmed  Assessment/Plan:  1. CV - History of a fib. SR in the 60's. On Norvasc 5 mg daily and Amiodarone 200 mg daily. Await 2 D echo  results. 2.  Pulmonary - On room air. Encourage incentive spirometer 4.  Acute blood loss anemia - H and H stable at 8.5 at 26.9 5. Thrombocytopenia-has a history of this. His platelets are down to 38,000.  6. Creatinine up to 2.39. Has stage III CKD. Creatinine was 2.74 prior to sugery.On Lasix 40 mg daily. 7. Management per cardiology  ZIMMERMAN,DONIELLE MPA-C 05/16/2014,9:25 AM  I have seen and examined the patient and agree with the assessment and plan as outlined.  Looks very good POD2.  Routine post op ECHO has not been done yet.  Anticipate possible d/c home 1-2 days.  I spent in excess of 15 minutes during the conduct of this hospital encounter and >50% of this time involved direct face-to-face encounter with the patient for counseling and/or coordination of their care.   Rexene Alberts 05/16/2014 11:43 AM

## 2014-05-17 ENCOUNTER — Encounter (HOSPITAL_COMMUNITY): Payer: Self-pay | Admitting: Nurse Practitioner

## 2014-05-17 ENCOUNTER — Telehealth: Payer: Self-pay | Admitting: Cardiovascular Disease

## 2014-05-17 ENCOUNTER — Inpatient Hospital Stay (HOSPITAL_COMMUNITY): Payer: Commercial Managed Care - HMO

## 2014-05-17 DIAGNOSIS — I350 Nonrheumatic aortic (valve) stenosis: Secondary | ICD-10-CM

## 2014-05-17 DIAGNOSIS — Z954 Presence of other heart-valve replacement: Secondary | ICD-10-CM

## 2014-05-17 DIAGNOSIS — I5033 Acute on chronic diastolic (congestive) heart failure: Secondary | ICD-10-CM

## 2014-05-17 LAB — TYPE AND SCREEN
ABO/RH(D): A POS
Antibody Screen: NEGATIVE
UNIT DIVISION: 0
Unit division: 0

## 2014-05-17 LAB — CBC
HEMATOCRIT: 26.3 % — AB (ref 39.0–52.0)
HEMOGLOBIN: 8.4 g/dL — AB (ref 13.0–17.0)
MCH: 28.2 pg (ref 26.0–34.0)
MCHC: 31.9 g/dL (ref 30.0–36.0)
MCV: 88.3 fL (ref 78.0–100.0)
Platelets: 40 10*3/uL — ABNORMAL LOW (ref 150–400)
RBC: 2.98 MIL/uL — AB (ref 4.22–5.81)
RDW: 19.1 % — ABNORMAL HIGH (ref 11.5–15.5)
WBC: 1.8 10*3/uL — ABNORMAL LOW (ref 4.0–10.5)

## 2014-05-17 LAB — GLUCOSE, CAPILLARY
Glucose-Capillary: 122 mg/dL — ABNORMAL HIGH (ref 70–99)
Glucose-Capillary: 120 mg/dL — ABNORMAL HIGH (ref 70–99)

## 2014-05-17 MED ORDER — PANTOPRAZOLE SODIUM 40 MG PO TBEC: 40.0000 mg | DELAYED_RELEASE_TABLET | Freq: Every day | ORAL | Status: DC

## 2014-05-17 MED ORDER — FUROSEMIDE 80 MG PO TABS: 40.0000 mg | ORAL_TABLET | Freq: Every day | ORAL | Status: DC

## 2014-05-17 MED FILL — Potassium Chloride Inj 2 mEq/ML: INTRAVENOUS | Qty: 40 | Status: AC

## 2014-05-17 MED FILL — Insulin Regular (Human) Inj 100 Unit/ML: INTRAMUSCULAR | Qty: 2.5 | Status: AC

## 2014-05-17 MED FILL — Magnesium Sulfate Inj 50%: INTRAMUSCULAR | Qty: 10 | Status: AC

## 2014-05-17 MED FILL — Heparin Sodium (Porcine) Inj 1000 Unit/ML: INTRAMUSCULAR | Qty: 30 | Status: AC

## 2014-05-17 NOTE — Progress Notes (Signed)
Medicare Important Message given? YES  (If response is "NO", the following Medicare IM given date fields will be blank)  Date Medicare IM given: 05/17/14 Medicare IM given by:  Latravis Grine  

## 2014-05-17 NOTE — Progress Notes (Signed)
05/17/2014 3:58 PM Discharge AVS meds taken today and those due this evening reviewed.  Follow-up appointments and when to call md reviewed.  D/C IV and TELE.  Questions and concerns addressed.   D/C home per orders. [place]

## 2014-05-17 NOTE — Progress Notes (Signed)
CARDIAC REHAB PHASE I   PRE:  Rate/Rhythm: 57 SB  BP:  Sitting: 169/51        SaO2: 100 RA  MODE:  Ambulation: 490 ft   POST:  Rate/Rhythm: 72 SR  BP:  Sitting: 173/57         SaO2: 100 RA  Pt wanting to ambulate prior to discharge. Pt ambulated 490 ft on RA, independent, steady gait, tolerated well. TAVR discharge education completed. Reviewed IS, activity progression, exercise, risk factors, diet recommendations, phase 2 cardiac rehab. Pt verbalized understanding. Pt wife and daughters at bedside. Pt returned to sit on side of bed, call light within reach. Pt agrees to phase 2 cardiac rehab, has been through program before. Will send referral to [facility].   [id number]  Lenna Sciara, RN, BSN 05/17/2014 11:23 AM

## 2014-05-17 NOTE — Progress Notes (Signed)
    Subjective:  Feels well. No chest pain or dyspnea.  Objective:  Vital Signs in the last 24 hours: Temp:  [98.5 F (36.9 C)-99.1 F (37.3 C)] 98.5 F (36.9 C) (05/09 0433) Pulse Rate:  [55-58] 55 (05/09 0433) Resp:  [16-18] 16 (05/09 0433) BP: (129-132)/(53-70) 130/70 mmHg (05/09 1008) SpO2:  [98 %-99 %] 98 % (05/09 0433) Weight:  [184 lb 15.5 oz (83.9 kg)] 184 lb 15.5 oz (83.9 kg) (05/09 0433)  Intake/Output from previous day: 05/08 0701 - 05/09 0700 In: 59 [P.O.:960] Out: 351 [Urine:350; Stool:1]  Physical Exam: Pt is alert and oriented, pleasant elderly male in NAD HEENT: normal Neck: JVP - normal Lungs: CTA bilaterally CV: RRR grade 2/6 mid peaking systolic murmur at the right upper sternal border, no diastolic murmur Abd: soft, NT, Positive BS, no hepatomegaly Ext: Diffuse ecchymoses, trace pretibial edema improved from baseline Skin: warm/dry no rash   Lab Results:  Recent Labs  05/16/14 0455 05/17/14 0418  WBC 1.5* 1.8*  HGB 8.5* 8.4*  PLT 38* 40*    Recent Labs  05/15/14 0359 05/16/14 0455  NA 137 138  K 4.3 4.5  CL 106 107  CO2 24 23  GLUCOSE 131* 139*  BUN 69* 65*  CREATININE 2.29* 2.39*   No results for input(s): TROPONINI in the last 72 hours.  Invalid input(s): CK, MB  Cardiac Studies: 2-D echocardiogram: Study Conclusions  - Left ventricle: The cavity size was normal. There was mild concentric hypertrophy. Systolic function was normal. The estimated ejection fraction was in the range of 55% to 60%. Wall motion was normal; there were no regional wall motion abnormalities. Features are consistent with a pseudonormal left ventricular filling pattern, with concomitant abnormal relaxation and increased filling pressure (grade 2 diastolic dysfunction). - Aortic valve: S/P TAVR with valve well seated. The mean AV gradient was 22mmHg with trivial perivalvular leak. - Mitral valve: Calcified annulus. There was trivial  regurgitation. - Left atrium: The atrium was severely dilated. - Pulmonary arteries: PA peak pressure: 48 mm Hg (S).  Impressions:  - The right ventricular systolic pressure was increased consistent with moderate pulmonary hypertension.  Tele: Personally reviewed, sinus rhythm  Assessment/Plan:  1. Acute on chronic diastolic heart failure, clinically improved after TAVR 2. Severe aortic stenosis, now postoperative day #3 after TAVR (valve and valve) 3. Stage III chronic kidney disease, stable renal indices. 4. Paroxysmal atrial fibrillation 5. Chronic thrombocytopenia, stable  The patient appears stable for discharge. His valve appears to be functioning normally except for elevated transvalvular gradients. I will review his echo study. There is no significant paravalvular leak. Anticipate discharge home today with close outpatient follow-up. Will arrange a PA/NP visit in 1-2 weeks and a 30 day valve clinic visit with repeat echocardiogram in one month.  [name], M.D. 05/17/2014, 11:54 AM

## 2014-05-17 NOTE — Telephone Encounter (Signed)
New message    tcm 14 days tcm on 5.20.2016  Per chris b

## 2014-05-17 NOTE — Discharge Instructions (Signed)
**  PLEASE REMEMBER TO BRING ALL OF YOUR MEDICATIONS TO EACH OF YOUR FOLLOW-UP OFFICE VISITS. ° °NO HEAVY LIFTING X 4 WEEKS. °NO SEXUAL ACTIVITY X 4 WEEKS. °NO DRIVING X 2 WEEKS. °NO SOAKING BATHS, HOT TUBS, POOLS, ETC., X 7 DAYS. ° °Groin Site Care °Refer to this sheet in the next few weeks. These instructions provide you with information on caring for yourself after your procedure. Your caregiver may also give you more specific instructions. Your treatment has been planned according to current medical practices, but problems sometimes occur. Call your caregiver if you have any problems or questions after your procedure. °HOME CARE INSTRUCTIONS °· You may shower 24 hours after the procedure. Remove the bandage (dressing) and gently wash the site with plain soap and water. Gently pat the site dry.  °· Do not apply powder or lotion to the site.  °· Do not sit in a bathtub, swimming pool, or whirlpool for 5 to 7 days.  °· No bending, squatting, or lifting anything over 10 pounds (4.5 kg) as directed by your caregiver.  °· Inspect the site at least twice daily.  °· Do not drive home if you are discharged the same day of the procedure. Have someone else drive you.  ° °What to expect: °· Any bruising will usually fade within 1 to 2 weeks.  °· Blood that collects in the tissue (hematoma) may be painful to the touch. It should usually decrease in size and tenderness within 1 to 2 weeks.  °SEEK IMMEDIATE MEDICAL CARE IF: °· You have unusual pain at the groin site or down the affected leg.  °· You have redness, warmth, swelling, or pain at the groin site.  °· You have drainage (other than a small amount of blood on the dressing).  °· You have chills.  °· You have a fever or persistent symptoms for more than 72 hours.  °· You have a fever and your symptoms suddenly get worse.  °· Your leg becomes pale, cool, tingly, or numb.  °You have heavy bleeding from the site. Hold pressure on the site. . ° °

## 2014-05-17 NOTE — Discharge Summary (Signed)
Discharge Summary   Patient ID: [name],  MRN: [id number], DOB/AGE: 09/07/1922 81 y.o.  Admit date: 05/13/2014 Discharge date: 05/17/2014  Primary Care Provider: [name] Primary Cardiologist: Jerilynn Mages. Burt Knack, MD   Discharge Diagnoses Principal Problem:   S/P TAVR (transcatheter aortic valve replacement)  **S/P successful placement of an Edwards Sapien 3 THV (size 23 mm, model # 9600TFX, serial # [id number]) this admission.  Active Problems:   Severe aortic insufficiency   Severe aortic stenosis   Diabetes mellitus   Essential hypertension   CAD, NATIVE VESSEL   PAF (paroxysmal atrial fibrillation)   CKD (chronic kidney disease), stage III   Other pancytopenia   Hypothyroidism  Allergies Allergies  Allergen Reactions  . Penicillins Other (See Comments)    Does not remember reaction (~year 1950)  . Neomycin-Bacitracin Zn-Polymyx Other (See Comments)    ? Reaction (thinks he remembers redness)   Procedures  Transcatheter Aortic Valve Replacement 5.6.2016  Edwards Sapien 3 THV (size 23 mm, model # 9600TFX, serial # [id number]) _____________  2D Echocardiogram 5.9.2016   History of Present Illness  81 year old male with the above complex problem list. He has a history of hypertension, hyperlipidemia, diabetes mellitus, stage III chronic kidney disease, aortic valve disease status post prior surgical bioprosthetic aortic valve replacement, pancytopenia, and paroxysmal atrial fibrillation. Unfortunately, he has developed severe degeneration of his previously placed porcine valve resulting in severe aortic insufficiency and stenosis. In that setting, he has had progressive exertional dyspnea and heart failure symptoms requiring titration of outpatient diuretics. He was evaluated in valve clinic and after extensive workup including CTs of the heart, chest, abdomen, and pelvis, and repeat echocardiography, decision was made to pursue transaortic valve replacement within the  previously placed surgical valve. He presented to [facility] May 5 for hydration and admission in the setting of stage III chronic kidney disease.  Hospital Course  Patient was stable following admission and was taken to the OR and underwent successful placement of an Edwards Sapien 3 THV in the aortic position. This was performed via a transfemoral approach. Patient tolerated the procedure well and postprocedure was initially monitored in the surgical intensive care unit where he did well clinically and was transferred to the floor May 7. He has been ambulating with cardiac rehabilitation without significant symptoms or limitations. Repeat echocardiography this morning shows elevated transvalvular gradients but overall normal valve function.  He will have a repeat echo in one month to re-evaluate.   He is felt to be stable for discharge today and we will arrange follow-up within the next 2 weeks.  Of note, in the setting of severe thrombocytopenia, he will remain on ASA only and will not be discharged on Plavix.  Further, we have held his previous dose of inderal in the setting of baseline bradycardia in the 50's during his admission.  Discharge Vitals Blood pressure 130/70, pulse 55, temperature 98.5 F (36.9 C), temperature source Oral, resp. rate 16, height 5\' 10"  (1.778 m), weight 184 lb 15.5 oz (83.9 kg), SpO2 98 %.  Filed Weights   05/15/14 0630 05/16/14 0423 05/17/14 0433  Weight: 184 lb 8.4 oz (83.7 kg) 185 lb 13.6 oz (84.3 kg) 184 lb 15.5 oz (83.9 kg)   Labs  CBC  Recent Labs  05/16/14 0455 05/17/14 0418  WBC 1.5* 1.8*  HGB 8.5* 8.4*  HCT 26.9* 26.3*  MCV 88.2 88.3  PLT 38* 40*   Basic Metabolic Panel  Recent Labs  05/15/14 0359 05/16/14 0455  NA 137 138  K 4.3 4.5  CL 106 107  CO2 24 23  GLUCOSE 131* 139*  BUN 69* 65*  CREATININE 2.29* 2.39*  CALCIUM 8.6* 8.6*  MG 2.2  --    Disposition  Pt is being discharged home today in good condition.  Follow-up  Plans & Appointments      Follow-up Information    Follow up with [name], MD In 1 month.   Specialty:  Cardiology   Why:  Office will arrange and contact you.   Contact information:   [address] [place] [address] [phone]       Follow up with Murray Hodgkins, NP On 05/28/2014.   Specialties:  Nurse Practitioner, Cardiology, Radiology   Why:  11:30 AM - Dr. Antionette Char Nurse Practitioner   Contact information:   [address] [place] [address] [phone]       Discharge Medications     Medication List    STOP taking these medications        metolazone 2.5 MG tablet  Commonly known as:  ZAROXOLYN     propranolol 10 MG tablet  Commonly known as:  INDERAL      TAKE these medications        allopurinol 100 MG tablet  Commonly known as:  ZYLOPRIM  TAKE 1 TABLET EVERY DAY     amiodarone 200 MG tablet  Commonly known as:  PACERONE  Take 1 tablet (200 mg total) by mouth daily.     amLODipine 5 MG tablet  Commonly known as:  NORVASC  Take 1 tablet (5 mg total) by mouth daily.     aspirin 325 MG tablet  Take 325 mg by mouth daily.     clonazePAM 0.5 MG tablet  Commonly known as:  KLONOPIN  1/2 as needed if you wake up early     colchicine 0.6 MG tablet  Take 0.6 mg by mouth daily as needed (gout flares). Take one tablet by mouth daily during an acute episode of gout.     ferrous sulfate 325 (65 FE) MG EC tablet  Take 1 tablet (325 mg total) by mouth 2 (two) times daily.     fish oil-omega-3 fatty acids 1000 MG capsule  Take 1 g by mouth daily.     Flax Seed Oil 1000 MG Caps  Take 1 capsule by mouth daily.     furosemide 80 MG tablet  Commonly known as:  LASIX  Take 0.5 tablets (40 mg total) by mouth daily.     levothyroxine 25 MCG tablet  Commonly known as:  LEVOTHROID  Take 1 tablet (25 mcg total) by mouth daily.     multivitamin tablet  Take 1 tablet by mouth daily.     mupirocin  ointment 2 %  Commonly known as:  BACTROBAN  Applied twice a day to the affected area     omeprazole 20 MG capsule  Commonly known as:  PRILOSEC  Take 1 capsule (20 mg total) by mouth daily.     potassium chloride 10 MEQ tablet  Commonly known as:  K-DUR  Take 1 tablet (10 mEq total) by mouth daily.     sertraline 50 MG tablet  Commonly known as:  ZOLOFT  Take 1 tablet (50 mg total) by mouth daily.     vitamin C 500 MG tablet  Commonly known as:  ASCORBIC ACID  Take 1 tablet (500 mg total) by mouth daily.  Vitamin D 1000 UNITS capsule  Take 1 capsule (1,000 Units total) by mouth daily.       Outstanding Labs/Studies  Follow-up echocardiogram in 1 month.  Duration of Discharge Encounter   Greater than 30 minutes including physician time.  Signed, Murray Hodgkins NP 05/17/2014, 1:12 PM

## 2014-05-17 NOTE — Progress Notes (Signed)
Echocardiogram 2D Echocardiogram has been performed.  Tresa Res 05/17/2014, 10:08 AM

## 2014-05-18 ENCOUNTER — Telehealth: Payer: Self-pay | Admitting: *Deleted

## 2014-05-18 NOTE — Telephone Encounter (Signed)
Transition Care Management Follow-up Telephone Call D/c 05/17/14  How have you been since you were released from the hospital? Pt states he is feeling alright   Do you understand why you were in the hospital? YES   Do you understand the discharge instrcutions? YES  Items Reviewed:  Medications reviewed: YES  Allergies reviewed: YES  Dietary changes reviewed: NO  Referrals reviewed: No referral needed   Functional Questionnaire:   Activities of Daily Living (ADLs):   He states he are independent in the following: ambulation, bathing and hygiene, feeding, continence, grooming, toileting and dressing States he doesn't require assistance    Any transportation issues/concerns?: NO   Any patient concerns? YES, been having some right shoulder pain   Confirmed importance and date/time of follow-up visits scheduled: YES, pt had already made appt for 05/21/14 for right shoulder pain advise pt to keep appt   Confirmed with patient if condition begins to worsen call PCP or go to the ER.

## 2014-05-18 NOTE — Care Management Note (Signed)
Case Management Note  Patient Details  Name: [name] MRN: [id number] Date of Birth: 02-01-1922  Subjective/Objective:    Pt admitted s/p TAVR                Action/Plan: Plan to d/c home with family lives with spouse, no needs identified  Expected Discharge Date:                  Expected Discharge Plan:  Home/Self Care  In-House Referral:     Discharge planning Services  CM Consult  Post Acute Care Choice:    Choice offered to:     DME Arranged:    DME Agency:     HH Arranged:    [place] Agency:     Status of Service:  Completed, signed off  Medicare Important Message Given:  Yes Date Medicare IM Given:  05/17/14 Medicare IM give by:  Marvetta Gibbons Date Additional Medicare IM Given:    Additional Medicare Important Message give by:     If discussed at [place] of Stay Meetings, dates discussed:    Additional Comments:  Dawayne Patricia, RN 05/18/2014, 12:12 PM

## 2014-05-18 NOTE — Telephone Encounter (Signed)
Patient contacted regarding discharge from [facility] on 5/9.  Patient understands to follow up with provider Ignacia Bayley, NP on 5/20 at 11:30 at [facility]. Patient understands discharge instructions? Yes Patient understands medications and regiment? Yes Patient understands to bring all medications to this visit? Yes  Patient verbalized understanding and agreement and states he is feeling well, only c/o soreness

## 2014-05-21 ENCOUNTER — Telehealth: Payer: Self-pay | Admitting: Nurse Practitioner

## 2014-05-21 ENCOUNTER — Ambulatory Visit (INDEPENDENT_AMBULATORY_CARE_PROVIDER_SITE_OTHER): Payer: Commercial Managed Care - HMO | Admitting: Internal Medicine

## 2014-05-21 ENCOUNTER — Encounter: Payer: Self-pay | Admitting: Internal Medicine

## 2014-05-21 ENCOUNTER — Ambulatory Visit (HOSPITAL_BASED_OUTPATIENT_CLINIC_OR_DEPARTMENT_OTHER): Payer: Commercial Managed Care - HMO | Admitting: Nurse Practitioner

## 2014-05-21 ENCOUNTER — Other Ambulatory Visit (HOSPITAL_BASED_OUTPATIENT_CLINIC_OR_DEPARTMENT_OTHER): Payer: Commercial Managed Care - HMO

## 2014-05-21 VITALS — BP 150/58 | HR 47 | Temp 97.5°F | Resp 16 | Ht 70.0 in | Wt 189.5 lb

## 2014-05-21 VITALS — BP 138/48 | HR 50 | Temp 98.0°F | Resp 18 | Ht 70.0 in | Wt 190.0 lb

## 2014-05-21 DIAGNOSIS — M7541 Impingement syndrome of right shoulder: Secondary | ICD-10-CM | POA: Diagnosis not present

## 2014-05-21 DIAGNOSIS — N183 Chronic kidney disease, stage 3 unspecified: Secondary | ICD-10-CM

## 2014-05-21 DIAGNOSIS — D61818 Other pancytopenia: Secondary | ICD-10-CM | POA: Diagnosis not present

## 2014-05-21 DIAGNOSIS — Z952 Presence of prosthetic heart valve: Secondary | ICD-10-CM

## 2014-05-21 DIAGNOSIS — D479 Neoplasm of uncertain behavior of lymphoid, hematopoietic and related tissue, unspecified: Secondary | ICD-10-CM | POA: Diagnosis not present

## 2014-05-21 DIAGNOSIS — N289 Disorder of kidney and ureter, unspecified: Secondary | ICD-10-CM

## 2014-05-21 DIAGNOSIS — R161 Splenomegaly, not elsewhere classified: Secondary | ICD-10-CM | POA: Diagnosis not present

## 2014-05-21 DIAGNOSIS — Z954 Presence of other heart-valve replacement: Secondary | ICD-10-CM | POA: Diagnosis not present

## 2014-05-21 DIAGNOSIS — D649 Anemia, unspecified: Secondary | ICD-10-CM

## 2014-05-21 DIAGNOSIS — R5381 Other malaise: Secondary | ICD-10-CM

## 2014-05-21 DIAGNOSIS — D696 Thrombocytopenia, unspecified: Secondary | ICD-10-CM

## 2014-05-21 DIAGNOSIS — I480 Paroxysmal atrial fibrillation: Secondary | ICD-10-CM

## 2014-05-21 LAB — CBC WITH DIFFERENTIAL/PLATELET
BASO%: 0.3 % (ref 0.0–2.0)
Basophils Absolute: 0 10*3/uL (ref 0.0–0.1)
EOS ABS: 0 10*3/uL (ref 0.0–0.5)
EOS%: 2.1 % (ref 0.0–7.0)
HEMATOCRIT: 30.1 % — AB (ref 38.4–49.9)
HEMOGLOBIN: 9.7 g/dL — AB (ref 13.0–17.1)
LYMPH%: 16 % (ref 14.0–49.0)
MCH: 28.1 pg (ref 27.2–33.4)
MCHC: 32.1 g/dL (ref 32.0–36.0)
MCV: 87.5 fL (ref 79.3–98.0)
MONO#: 0.2 10*3/uL (ref 0.1–0.9)
MONO%: 6.5 % (ref 0.0–14.0)
NEUT#: 1.7 10*3/uL (ref 1.5–6.5)
NEUT%: 75.1 % — ABNORMAL HIGH (ref 39.0–75.0)
Platelets: 74 10*3/uL — ABNORMAL LOW (ref 140–400)
RBC: 3.44 10*6/uL — ABNORMAL LOW (ref 4.20–5.82)
RDW: 19.2 % — ABNORMAL HIGH (ref 11.0–14.6)
WBC: 2.3 10*3/uL — ABNORMAL LOW (ref 4.0–10.3)
lymph#: 0.4 10*3/uL — ABNORMAL LOW (ref 0.9–3.3)
nRBC: 0 % (ref 0–0)

## 2014-05-21 LAB — TECHNOLOGIST REVIEW

## 2014-05-21 NOTE — Progress Notes (Signed)
Pre visit review using our clinic review tool, if applicable. No additional management support is needed unless otherwise documented below in the visit note. 

## 2014-05-21 NOTE — Progress Notes (Signed)
  [place] OFFICE PROGRESS NOTE   Diagnosis:  Anemia, thrombocytopenia, splenomegaly  INTERVAL HISTORY:   [name] returns as scheduled. He underwent TAVR 05/14/2014. He notes a significant improvement in his energy level. No shortness of breath. He denies any bleeding. He has mild pain at the right groin incision. He denies the presence of any redness. No fevers or sweats. He has a good appetite.  Objective:  Vital signs in last 24 hours:  Blood pressure 138/48, pulse 50, temperature 98 F (36.7 C), temperature source Oral, resp. rate 18, height 5' 10" (1.778 m), weight 190 lb (86.183 kg), SpO2 100 %.    HEENT: No thrush or ulcers. Lymphatics: No palpable cervical or supraclavicular lymph nodes. Resp: Lungs clear bilaterally. Cardio: Regular rate and rhythm. 2/6 systolic murmur. GI: Abdomen soft. Spleen palpable left abdomen. Vascular: Trace pitting edema at the lower legs bilaterally. Neuro: Alert and oriented.  Skin: Right groin incision is healing. No surrounding erythema.    Lab Results:  Lab Results  Component Value Date   WBC 2.3* 05/21/2014   HGB 9.7* 05/21/2014   HCT 30.1* 05/21/2014   MCV 87.5 05/21/2014   PLT 74* 05/21/2014   NEUTROABS 1.7 05/21/2014    Imaging:  No results found.  Medications: I have reviewed the patient's current medications.  Assessment/Plan: 1.Chronic pancytopenia/splenomegaly-likely related to a chronic lymphoproliferative disorder,? Splenic lymphoma   Bone marrow biopsy at [facility] November 2012-slightly hypercellular marrow with trilineage hematopoiesis, interstitial Nieman-Pick like histiocytosis. Negative for dysplasia, negative for lymphoma, negative for increased blasts. 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 History of severe aortic stenosis, status post aortic valve replacement surgery at [facility] on 01/23/2011; status post transcatheter aortic valve  replacement 05/14/2014  3. History of gout  4. Diabetes  5. Streptococcus bacteremia-TEE negative for endocarditis  6. Malaise-likely multifactorial 7. Renal insufficiency  8. Paroxysmal atrial fibrillation  9. Colon polyps noted on a virtual colonoscopy 02/10/2013  10. Anemia secondary to renal insufficiency and the chronic lymphoproliferative disorder. Trial of weekly erythropoietin initiated 04/09/2014. Improved.    Disposition: [name] appears stable. Dr. Benay Walton recommends holding on further erythropoietin and Nplate and monitoring the blood counts. He will return for a CBC in 3 weeks and a follow-up visit with labs in 6 weeks. He will contact the office in the interim with any problems.  Patient seen with Dr. Benay Walton.    [name] ANP/GNP-BC   05/21/2014  9:35 AM

## 2014-05-21 NOTE — Telephone Encounter (Signed)
per pof to sch pt appt-gave pt copy of sch °

## 2014-05-21 NOTE — Patient Instructions (Addendum)
The Orthopedic referral will be scheduled and you'll be notified of the time.Please call the Referral Co-Ordinator @ [phone] if you have not been notified of appointment time within 7-10 days.   BUN, creatinine, and GFR  all assess kidney function. To protect the kidneys it  is important to control your blood pressure and sugar. You should also stay well hydrated. Drink to thirst, up to 32 ounces of fluids per day.  Progressive kidney impairment is typically associated with anemia which is unrelated to  iron deficiency or B12 deficiency.

## 2014-05-21 NOTE — Progress Notes (Signed)
   Subjective:    Patient ID: [name], male    DOB: 08-Mar-1922, 81 y.o.   MRN: [id number]  HPI The hospital records 5/5-05/17/14 were reviewed. He underwent transcatheter aortic valve replacement for severe aortic insufficiency of a porcine valve. The presentation was of increasing dyspnea on exertion and clinical congestive heart failure. Preoperatively he did well without complications despite multiple comorbidities including pancytopenia and advanced chronic kidney disease.  His only active complaint is pain in the right shoulder. In March he had injured the shoulder when he threw some wood onto a barbecue fire. He's had a steroid injection by Dr. Noemi Chapel. He is requesting orthopedic reassessment as the pain persists.  He saw his Hematologist today for pancytopenia. All 3 parameters are improving.  Renal function is slightly worse with  creatinine 2.39, BUN 65 and GFR 26    Review of Systems  Chest pain, palpitations, tachycardia, exertional dyspnea, paroxysmal nocturnal dyspnea, claudication or edema are absent.  Epistaxis, hemoptysis, hematuria, melena, or rectal bleeding denied. No unexplained weight loss, significant dyspepsia,dysphagia, or abdominal pain.  There is no abnormal bruising , bleeding, or difficulty stopping bleeding with injury.     Objective:   Physical Exam  Pertinent or positive findings include: There is a grade 1 systolic murmur at the right base.  He has 1+ tense edema over the lower extremities.  There is hyperpigmentation of the lower extremities. He has diffuse chronic ecchymotic change of the forearms. Dorsalis pedis pulses are decreased.  There is no hepatojugular reflux or neck vein distention at 10. There is marked decreased range of motion of the shoulders with pain with elevation on the right.  General appearance :adequately nourished; in no distress. Eyes: No conjunctival inflammation or scleral icterus is present. Heart:  Normal rate and  regular rhythm. S1 and S2 normal without gallop, click, rub or other extra sounds   Lungs:Chest clear to auscultation; no wheezes, rhonchi,rales ,or rubs present.No increased work of breathing.  Abdomen: bowel sounds normal, soft and non-tender without masses, organomegaly or hernias noted.  No guarding or rebound.  Vascular : all pulses equal ; no bruits present. Skin:Warm & dry.  Intact without suspicious lesions or rashes ; no tenting or jaundice  Lymphatic: No lymphadenopathy is noted about the head, neck, axilla Neuro: Strength, tone normal     Assessment & Plan:  See Current Assessment & Plan in Problem List under specific Diagnosis

## 2014-05-26 ENCOUNTER — Telehealth: Payer: Self-pay | Admitting: Internal Medicine

## 2014-05-26 NOTE — Telephone Encounter (Signed)
Patient already has current referrals for Dr. Benay Spice and Dr. Burt Knack.  Dr. [name] Josem Kaufmann #[id number] valid 06/02/14-11/29/14 for 6 visits

## 2014-05-26 NOTE — Telephone Encounter (Signed)
Left message for patient to call office. He also has appt scheduled with Dr. Alfonso Ramus on Monday, 05/31/14, at 2:00pm.

## 2014-05-26 NOTE — Telephone Encounter (Signed)
Patient is requesting a [employer] referral to be sent to: Dr. Benay Spice hematology - patient has an appointment in June Dr. [name] Cardiology - patient has an appointment 6/14 Dr. Margretta Sidle - cardiologist Surgeon - patient has appointment 5/25

## 2014-05-27 ENCOUNTER — Telehealth: Payer: Self-pay | Admitting: *Deleted

## 2014-05-27 NOTE — Telephone Encounter (Signed)
Spoke w/pt. °

## 2014-05-27 NOTE — Telephone Encounter (Signed)
S/w Janna Arch @ [phone] @ [employer].  Pt is on Omeprazole ( 20 mg) daily not Pantoprazole ( 40 mg ) daily.  Pharmacist aware of change.  Also discussed with Theodosia Quay

## 2014-05-28 ENCOUNTER — Ambulatory Visit (INDEPENDENT_AMBULATORY_CARE_PROVIDER_SITE_OTHER): Payer: Commercial Managed Care - HMO | Admitting: Nurse Practitioner

## 2014-05-28 ENCOUNTER — Encounter: Payer: Self-pay | Admitting: Nurse Practitioner

## 2014-05-28 VITALS — BP 140/42 | HR 49 | Ht 70.0 in | Wt 193.2 lb

## 2014-05-28 DIAGNOSIS — Z954 Presence of other heart-valve replacement: Secondary | ICD-10-CM | POA: Diagnosis not present

## 2014-05-28 DIAGNOSIS — I2510 Atherosclerotic heart disease of native coronary artery without angina pectoris: Secondary | ICD-10-CM

## 2014-05-28 DIAGNOSIS — N183 Chronic kidney disease, stage 3 unspecified: Secondary | ICD-10-CM

## 2014-05-28 DIAGNOSIS — Z952 Presence of prosthetic heart valve: Secondary | ICD-10-CM

## 2014-05-28 DIAGNOSIS — I350 Nonrheumatic aortic (valve) stenosis: Secondary | ICD-10-CM | POA: Diagnosis not present

## 2014-05-28 DIAGNOSIS — I10 Essential (primary) hypertension: Secondary | ICD-10-CM

## 2014-05-28 DIAGNOSIS — I480 Paroxysmal atrial fibrillation: Secondary | ICD-10-CM

## 2014-05-28 LAB — BASIC METABOLIC PANEL
BUN: 29 mg/dL — ABNORMAL HIGH (ref 6–23)
CO2: 22 mEq/L (ref 19–32)
Calcium: 8.9 mg/dL (ref 8.4–10.5)
Chloride: 109 mEq/L (ref 96–112)
Creatinine, Ser: 1.65 mg/dL — ABNORMAL HIGH (ref 0.40–1.50)
GFR: 41.84 mL/min — AB (ref >60.00)
GLUCOSE: 108 mg/dL — AB (ref 70–99)
POTASSIUM: 4.8 meq/L (ref 3.5–5.1)
Sodium: 136 mEq/L (ref 135–145)

## 2014-05-28 MED ORDER — FUROSEMIDE 80 MG PO TABS: 80.0000 mg | ORAL_TABLET | Freq: Every day | ORAL | Status: DC

## 2014-05-28 NOTE — Patient Instructions (Addendum)
Medication Instructions:   TAKE LASIX 80 MG ONCE A DAY    Labwork:  BMET   Testing/Procedures:   Follow-Up:   ON FLEX SCHEDULE THE WEEK OF 06/08/14  WITH AVAILABLE APP  PER CHRIS BERG FOR FOLLOW UP  HEART FAILURE   Any Other Special Instructions Will Be Listed Below (If Applicable).

## 2014-05-28 NOTE — Progress Notes (Signed)
Patient Name: [name] Date of Encounter: 05/28/2014  Primary Care Provider:  [name], MD Primary Cardiologist:  Jerilynn Mages. Burt Knack, MD   Chief Complaint  81 year old male status post recent TAVR who presents for follow-up.  Past Medical History   Past Medical History  Diagnosis Date  . Stroke   . Atrial fibrillation     a. amiodarone therapy; not felt to be a candidate for anticoagulation - bruises easily.  . Bradycardia   . Aortic stenosis     a. Previously severe -> s/p minimally invasive tissue AVR with Dr. Evelina Dun at [facility] 01/2011 (pre-AVR cath with no obs CAD);  b. 05/2014 s/p TAVR (23 mm Edwards Sapien 3).  . Hypertension   . Anemia   . Action tremor   . Thrombocytopenia     Dr. Benay Spice  . Colon polyp   . Anxiety   . Splenomegaly   . Gout   . GERD (gastroesophageal reflux disease)   . Hypothyroidism   . Leukopenia     Chronic pancytopenia  . Degenerative disc disease   . Joint effusion, knee     left knee  . Synovial cyst of popliteal space   . Cellulitis of left leg 10/11-16/2012  . CKD (chronic kidney disease), stage III   . Chronic diastolic CHF (congestive heart failure)     a. 12/15/2012 TEE: EF 60-65%, no veg.  . Bacteremia     a. 12/2012 - S bovis;  b. 12/2012 TEE w/o veg;  c. Seeing ID->Rocephin therapy extended to 01/25/2013 via PICC for possible endocarditis (No veg on TEE).  . High cholesterol   . Diabetes mellitus     "borderline" (01/05/2013)  . History of blood transfusion 01/2011; 11/2012  . H/O hiatal hernia   . Throat cancer     s/p lasered  . Diverticulosis   . Severe aortic insufficiency   . S/P TAVR (transcatheter aortic valve replacement)     a. 05/14/2014 TAVR: 23 mm Edwards Sapien 3 transcatheter heart valve placed valve-in-valve for prosthetic valve dysfunction via open right transfemoral approach   Past Surgical History  Procedure Laterality Date  . Cholecystectomy    . Joint effusion      left knee  . Aortic valve replacement   01/23/2011    via minimally invasive approach per Dr Evelina Dun, [facility]  . Tee without cardioversion N/A 12/15/2012    Procedure: TRANSESOPHAGEAL ECHOCARDIOGRAM (TEE);  Surgeon: [name], MD;  Location: [facility] ENDOSCOPY;  Service: Cardiovascular;  Laterality: N/A;  . Cardiac valve replacement    . Inguinal hernia repair Right   . Excisional hemorrhoidectomy    . Cardiac catheterization    . Surgery scrotal / testicular      "removed one" (01/05/2013)  . 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 Tee without cardioversion N/A 03/26/2014    Procedure: TRANSESOPHAGEAL ECHOCARDIOGRAM (TEE);  Surgeon: Josue Hector, MD;  Location: [facility] ENDOSCOPY;  Service: Cardiovascular;  Laterality: N/A;  . Transcatheter aortic valve replacement, transfemoral Right 05/14/2014    Procedure: TRANSCATHETER AORTIC VALVE REPLACEMENT, TRANSFEMORAL;  Surgeon: [name], MD;  Location: [place];  Service: Open Heart Surgery;  Laterality: Right;  . Tee without cardioversion N/A 05/14/2014    Procedure: TRANSESOPHAGEAL ECHOCARDIOGRAM (TEE);  Surgeon: [name], MD;  Location: [place];  Service: Open Heart  Surgery;  Laterality: N/A;    Allergies  Allergies  Allergen Reactions  . Penicillins Other (See Comments)    Does not remember reaction (~year 1950)  . Neomycin-Bacitracin Zn-Polymyx Other (See Comments)    ? Reaction (thinks he remembers redness)    HPI  81 year old male with the above complex problem list. 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 In that setting, he had progressive exertional dyspnea and heart failure symptoms requiring titration of outpatient diuretics. After extensive evaluation in TAVR clinic, he  was admitted to [facility] on May 5 and underwent successful TAVR on May 6. He was subsequently discharged on May 9. Patient says that since discharge, he has been feeling reasonably well. He is not noticed any significant dyspnea exertion, chest pain, presyncope, or syncope. His right groin is healing up though he has had some swelling of the medial thigh with bruising more distally. He has also had bilateral lower extremity edema to his knees which she feels is more than what he had preprocedurally. His abdomen also feels more full and his weight, which was listed at 178 preprocedure, is now 193 fully clothed today. He said he was 189 on his home scale with just his underwear on. Notably, his Lasix dose was reduced from 120 mg daily down to 40 mg daily during his hospitalization.  Home Medications  Prior to Admission medications   Medication Sig Start Date End Date Taking? Authorizing Provider  allopurinol (ZYLOPRIM) 100 MG tablet TAKE 1 TABLET EVERY DAY 05/12/14  Yes Hendricks Limes, MD  amiodarone (PACERONE) 200 MG tablet Take 1 tablet (200 mg total) by mouth daily. 02/04/14  Yes [name], MD  amLODipine (NORVASC) 5 MG tablet Take 1 tablet (5 mg total) by mouth daily. 02/04/14  Yes [name], MD  aspirin 325 MG tablet Take 325 mg by mouth daily.     Yes Historical Provider, MD  Cholecalciferol (VITAMIN D) 1000 UNITS capsule Take 1 capsule (1,000 Units total) by mouth daily. 01/05/11  Yes Renella Cunas, MD  clonazePAM (KLONOPIN) 0.5 MG tablet 1/2 as needed if you wake up early Patient taking differently: Take 0.5 mg by mouth daily as needed (wake up early).  03/08/14  Yes Hendricks Limes, MD  colchicine 0.6 MG tablet Take 0.6 mg by mouth daily as needed (gout flares). Take one tablet by mouth daily during an acute episode of gout. 04/03/11  Yes Renella Cunas, MD  ferrous sulfate 325 (65 FE) MG EC tablet Take 1 tablet (325 mg total) by mouth 2 (two) times daily. 04/09/14  Yes Ladell Pier,  MD  fish oil-omega-3 fatty acids 1000 MG capsule Take 1 g by mouth daily.     Yes Historical Provider, MD  Flaxseed, Linseed, (FLAX SEED OIL) 1000 MG CAPS Take 1 capsule by mouth daily.    Yes Historical Provider, MD  furosemide (LASIX) 80 MG tablet Take 1 tablet (80 mg total) by mouth daily. 05/28/14  Yes Rogelia Mire, NP  levothyroxine (LEVOTHROID) 25 MCG tablet Take 1 tablet (25 mcg total) by mouth daily. 02/04/14 02/04/15 Yes [name], MD  metolazone (ZAROXOLYN) 2.5 MG tablet Take 2.5 mg by mouth daily as needed (sleep).   Yes Historical Provider, MD  Multiple Vitamin (MULTIVITAMIN) tablet Take 1 tablet by mouth daily.     Yes Historical Provider, MD  omeprazole (PRILOSEC) 20 MG  capsule Take 1 capsule (20 mg total) by mouth daily. 02/04/14  Yes [name], MD  potassium chloride (K-DUR) 10 MEQ tablet Take 1 tablet (10 mEq total) by mouth daily. 02/04/14  Yes [name], MD  propranolol (INDERAL) 10 MG tablet Take 10 mg by mouth 2 (two) times daily.   Yes Historical Provider, MD  sertraline (ZOLOFT) 50 MG tablet Take 1 tablet (50 mg total) by mouth daily. 04/13/14  Yes Hendricks Limes, MD  vitamin C (ASCORBIC ACID) 500 MG tablet Take 1 tablet (500 mg total) by mouth daily. 01/05/11  Yes Renella Cunas, MD    Review of Systems  As above, he has been experiencing bilateral lower extremity edema and increasing abdominal girth with associated weight gain. He has not been having any chest pain, dyspnea, PND, orthopnea, dizziness, syncope..  All other systems reviewed and are otherwise negative except as noted above.  Physical Exam  VS:  BP 140/42 mmHg  Pulse 49  Ht 5\' 10"  (1.778 m)  Wt 193 lb 3.2 oz (87.635 kg)  BMI 27.72 kg/m2 , BMI Body mass index is 27.72 kg/(m^2). GEN: Well nourished, well developed, in no acute distress. HEENT: normal. Neck: Supple. JVD approximately 12 cm. No carotid bruits, or masses. Cardiac: RRR, 3/6 harsh systolic murmur loudest at the right sternal  border but heard throughout. No rubs, or gallops. No clubbing, cyanosis. He has 2+ bilateral lower extremity edema to the knees.  Radials/DP/PT 2+ and equal bilaterally. The right groin TAVR site is without bleeding, bruit or hematoma. The medial right thigh is mildly edematous extending to the medial knee area with ecchymosis. Respiratory:  Respirations regular and unlabored, clear to auscultation bilaterally. GI: Soft, nontender, nondistended, BS + x 4. MS: no deformity or atrophy. Skin: warm and dry, no rash. Neuro:  Strength and sensation are intact. Psych: Normal affect.  Accessory Clinical Findings  ECG - sinus bradycardia, 49, first-degree AV block, left axis, LVH, no acute ST or T changes.  Assessment & Plan  1.  Aortic insufficiency and stenosis status post TAVR: Patient status post TAVR on May 9 and is doing reasonably well since his procedure. He has been experiencing increasing abdominal girth and lower extremity edema associated with reduction in Lasix dose during his hospitalization. He does have evidence of volume overload on exam. I will increase his Lasix to 80 mg daily. I have offered him follow-up next week but he has f/u Dr. [name] on 5/25 and thus it's reasonable to push out f/u with [place] a little further.  He has f/u with Dr. Burt Knack in late June with plan for an echo prior to that visit.  2.  Acute on chronic diastolic chf: As above, pt is volume overloaded today.  He was 189 lbs on his home scale this AM, which is 13 lbs above his preadmission wt.  Lasix dose was reduced during admission.  I will check a bmet today and have asked him to increase his lasix to 80 mg daily.  As above, he will see Dr. [name] next week and we will arrange for f/u in 2 wks, so that we may keep close tabs on his volume status and thus prevent readmission.  He promises to continue to weigh himself daily and call us if he does not respond to the higher dose of diuretic.  3.  CKD III:  F/u bmet today in  setting of increasing lasix.    4.  Essential HTN:  BP mildly elevated today.  Increasing lasix.  5.  PAF:  In sinus on amio.  HR 49 - stable.  BB was d/c'd during hospitalization 2/2 bradycardia.  No coumadin 2/2 h/o chronic pancytopenia.  6.  Dispo:  He has f/u next week with [facility].  We will arrange for f/u here in 2 wks and he has f/u with Dr. Burt Knack in TAVR clinic in late June.  Murray Hodgkins, NP 05/28/2014, 12:10 PM

## 2014-05-31 ENCOUNTER — Telehealth: Payer: Self-pay | Admitting: Cardiovascular Disease

## 2014-05-31 DIAGNOSIS — I350 Nonrheumatic aortic (valve) stenosis: Secondary | ICD-10-CM

## 2014-05-31 MED ORDER — FUROSEMIDE 80 MG PO TABS: ORAL_TABLET | ORAL | Status: DC

## 2014-05-31 NOTE — Telephone Encounter (Signed)
Left message on pt's voicemail with instructions

## 2014-05-31 NOTE — Telephone Encounter (Signed)
I spoke with the pt and made him aware of instructions. The pt verbalized understanding of instructions. The pt has a scheduled appointment in our office on 06/09/14 and we will repeat BMP at that time.

## 2014-05-31 NOTE — Telephone Encounter (Signed)
New message       Pt gained 2 lbs since yesterday.  Lasix was increased Friday to 80mg  but it does not seem to be working

## 2014-05-31 NOTE — Telephone Encounter (Signed)
I spoke with the pt and he increased his dose of Furosemide to 80mg  daily on Friday.    [name] weight on his home scales: Friday 189 Saturday 187 Sunday 188 Monday 189  The pt has not had any worsening in his breathing but he does still complain of fluid retention in his legs and abdomen. The pt does have an as needed prescription for Metolazone 2.5mg .  I will forward this message to Dr Burt Knack for review and recommendations. BMP was checked on 05/28/14.

## 2014-05-31 NOTE — Telephone Encounter (Signed)
Would take metolazone tomorrow am before lasix dose, then increase lasix to 80 mg qam and 40 mg qpm. bmet 2 weeks from now. thx

## 2014-06-02 ENCOUNTER — Encounter: Payer: Self-pay | Admitting: Surgery

## 2014-06-02 ENCOUNTER — Ambulatory Visit (INDEPENDENT_AMBULATORY_CARE_PROVIDER_SITE_OTHER): Payer: Commercial Managed Care - HMO | Admitting: Surgery

## 2014-06-02 VITALS — BP 148/68 | HR 50 | Resp 16 | Ht 70.0 in | Wt 191.0 lb

## 2014-06-02 DIAGNOSIS — I350 Nonrheumatic aortic (valve) stenosis: Secondary | ICD-10-CM | POA: Diagnosis not present

## 2014-06-02 DIAGNOSIS — Z953 Presence of xenogenic heart valve: Secondary | ICD-10-CM

## 2014-06-02 DIAGNOSIS — Z954 Presence of other heart-valve replacement: Secondary | ICD-10-CM

## 2014-06-02 NOTE — Progress Notes (Signed)
HPI: Patient returns for routine postoperative follow-up having undergone right transfemoral valve in valve TAVR using a 23 mm Sapien 3 valve on 05/14/2014. [name] early postoperative recovery while in the hospital was notable for an uncomplicated postop course. Since hospital discharge the patient reports that he has been feeling well and is walking without shortness of breath.   Current Outpatient Prescriptions  Medication Sig Dispense Refill  . allopurinol (ZYLOPRIM) 100 MG tablet TAKE 1 TABLET EVERY DAY 90 tablet 0  . amiodarone (PACERONE) 200 MG tablet Take 1 tablet (200 mg total) by mouth daily. 90 tablet 1  . amLODipine (NORVASC) 5 MG tablet Take 1 tablet (5 mg total) by mouth daily. 90 tablet 1  . aspirin 325 MG tablet Take 325 mg by mouth daily.      . Cholecalciferol (VITAMIN D) 1000 UNITS capsule Take 1 capsule (1,000 Units total) by mouth daily. 90 capsule 3  . clonazePAM (KLONOPIN) 0.5 MG tablet 1/2 as needed if you wake up early (Patient taking differently: Take 0.5 mg by mouth daily as needed (wake up early). ) 30 tablet 1  . colchicine 0.6 MG tablet Take 0.6 mg by mouth daily as needed (gout flares). Take one tablet by mouth daily during an acute episode of gout.    . fish oil-omega-3 fatty acids 1000 MG capsule Take 1 g by mouth daily.      . Flaxseed, Linseed, (FLAX SEED OIL) 1000 MG CAPS Take 1 capsule by mouth daily.     . furosemide (LASIX) 80 MG tablet Take 80mg  in the morning and 40 mg in the evening 90 tablet 3  . levothyroxine (LEVOTHROID) 25 MCG tablet Take 1 tablet (25 mcg total) by mouth daily. 90 tablet 1  . metolazone (ZAROXOLYN) 2.5 MG tablet Take 2.5 mg by mouth daily as needed (sleep). DAILY AS NEEDED FOR EDEMA    . Multiple Vitamin (MULTIVITAMIN) tablet Take 1 tablet by mouth daily.      [name] Kitchen omeprazole (PRILOSEC) 20 MG capsule Take 1 capsule (20 mg total) by mouth daily. 90 capsule 1  . potassium chloride (K-DUR) 10 MEQ tablet Take 1 tablet (10 mEq  total) by mouth daily. 90 tablet 1  . propranolol (INDERAL) 10 MG tablet Take 10 mg by mouth 2 (two) times daily.    . sertraline (ZOLOFT) 50 MG tablet Take 1 tablet (50 mg total) by mouth daily. 90 tablet 0  . vitamin C (ASCORBIC ACID) 500 MG tablet Take 1 tablet (500 mg total) by mouth daily. 90 tablet 3   No current facility-administered medications for this visit.   Facility-Administered Medications Ordered in Other Visits  Medication Dose Route Frequency Provider Last Rate Last Dose  . epoetin alfa (EPOGEN,PROCRIT) injection 40,000 Units  40,000 Units Subcutaneous Once Ladell Pier, MD        Physical Exam: BP 148/68 mmHg  Pulse 50  Resp 16  Ht 5\' 10"  (1.778 m)  Wt 191 lb (86.637 kg)  BMI 27.41 kg/m2  SpO2 99% He looks well Lungs are clear Cardiac exam shows a regular rate and rhythm with normal valve sounds and a 2/6 systolic flow murmur along the RSB. The right groin incision is healing well  Diagnostic Tests:          *[facility]*            [address]  [place], [place] [address]              [phone]  ------------------------------------------------------------------- Transthoracic Echocardiography  (Report amended )  Patient:  [name], [name] MR #:    [id number] Study Date: 05/17/2014 Gender:   M Age:    81 Height:   177.8 cm Weight:   83.5 kg BSA:    2.04 m^2 Pt. Status: Room:    0F09N  Andrena Mews, M.D. REFERRING  Darylene Price, M.D. ADMITTING  [place] SONOGRAPHER Tresa Res, RDCS PERFORMING  [facility], Inpatient  cc:  ------------------------------------------------------------------- LV EF: 55% -  60%  ------------------------------------------------------------------- Indications:   Aortic stenosis /insufficiency  424.1.  ------------------------------------------------------------------- History:  PMH:  Congestive heart failure. Risk factors: Diabetes mellitus.  ------------------------------------------------------------------- Study Conclusions  - Left ventricle: The cavity size was normal. There was mild concentric hypertrophy. Systolic function was normal. The estimated ejection fraction was in the range of 55% to 60%. Wall motion was normal; there were no regional wall motion abnormalities. Features are consistent with a pseudonormal left ventricular filling pattern, with concomitant abnormal relaxation and increased filling pressure (grade 2 diastolic dysfunction). - Aortic valve: S/P TAVR with valve well seated. The mean AV gradient was 27mmHg with trivial perivalvular leak. There was one CW gradient obtained of 30mmHg but is most likely elevated due to some degree of outflow tract obstructive along with AV gradient. He has evidence of septal hypertrophy. - Mitral valve: Calcified annulus. There was trivial regurgitation. - Left atrium: The atrium was severely dilated. - Pulmonary arteries: PA peak pressure: 48 mm Hg (S).  Impressions:  - The right ventricular systolic pressure was increased consistent with moderate pulmonary hypertension.  ------------------------------------------------------------------- Labs, prior tests, procedures, and surgery: S/P TAVR.  Transthoracic echocardiography. M-mode, complete 2D, spectral Doppler, and color Doppler. Birthdate: Patient birthdate: Nov 25, 1922. Age: Patient is 81 yr old. Sex: Gender: male. BMI: 26.4 kg/m^2. Blood pressure:   130/70 Patient status: Inpatient. Study date: Study date: 05/17/2014. Study time: 09:24 AM. Location: Bedside.  -------------------------------------------------------------------  ------------------------------------------------------------------- Left ventricle: The  cavity size was normal. There was mild concentric hypertrophy. Systolic function was normal. The estimated ejection fraction was in the range of 55% to 60%. Wall motion was normal; there were no regional wall motion abnormalities. Features are consistent with a pseudonormal left ventricular filling pattern, with concomitant abnormal relaxation and increased filling pressure (grade 2 diastolic dysfunction).  ------------------------------------------------------------------- Aortic valve: S/P TAVR with valve well seated. The mean AV gradient was 30mmHg with trivial perivalvular leak. There was one CW gradient obtained of 50mmHg but is most likely elevated due to some degree of outflow tract obstructive along with AV gradient. He has evidence of septal hypertrophy. Trileaflet; normal thickness leaflets. Mobility was not restricted. Doppler: Transvalvular velocity was within the normal range. There was no stenosis. There was no regurgitation.  VTI ratio of LVOT to aortic valve: 0.57. Peak velocity ratio of LVOT to aortic valve: 0.49. Mean velocity ratio of LVOT to aortic valve: 0.55.  Mean gradient (S): 38 mm Hg. Peak gradient (S): 70 mm Hg.  ------------------------------------------------------------------- Aorta: Aortic root: The aortic root was normal in size.  ------------------------------------------------------------------- Mitral valve:  Calcified annulus. Mobility was not restricted. Doppler: Transvalvular velocity was within the normal range. There was no evidence for stenosis. There was trivial regurgitation. Valve area by pressure half-time: 2.75 cm^2. Indexed valve area by pressure half-time: 1.35 cm^2/m^2.  Mean gradient (D): 5 mm Hg. Peak gradient (D): 9 mm Hg.  -------------------------------------------------------------------  Left atrium: The atrium was severely dilated.  ------------------------------------------------------------------- Right  ventricle: The cavity size was normal. Wall thickness was normal. Systolic function was normal.  ------------------------------------------------------------------- Pulmonic valve:  Structurally normal valve.  Cusp separation was normal. Doppler: Transvalvular velocity was within the normal range. There was no evidence for stenosis. There was no regurgitation.  ------------------------------------------------------------------- Tricuspid valve:  Structurally normal valve.  Leaflet separation was normal. Doppler: Transvalvular velocity was within the normal range. There was mild regurgitation.  ------------------------------------------------------------------- Pulmonary artery:  The main pulmonary artery was normal-sized. Systolic pressure was within the normal range.  ------------------------------------------------------------------- Right atrium: The atrium was normal in size.  ------------------------------------------------------------------- Pericardium: There was no pericardial effusion.  ------------------------------------------------------------------- Systemic veins: Inferior vena cava: The vessel was normal in size.  ------------------------------------------------------------------- Measurements  Left ventricle             Value     Reference LV ID, ED, PLAX chordal    (H)   52.6 mm    43 - 52 LV ID, ES, PLAX chordal        35  mm    23 - 38 LV fx shortening, PLAX chordal     33  %    >=29 LV PW thickness, ED          15  mm    --------- IVS/LV PW ratio, ED          0.83      <=1.3 LV e&', lateral             10.5 cm/s   --------- LV E/e&', lateral            14.1      --------- LV e&', medial             4.2  cm/s   --------- LV E/e&', medial            35.24     --------- LV e&', average              7.35 cm/s   --------- LV E/e&', average            20.14     ---------  Ventricular septum           Value     Reference IVS thickness, ED           12.5 mm    ---------  LVOT                  Value     Reference LVOT peak velocity, S         204  cm/s   --------- LVOT mean velocity, S         159  cm/s   --------- LVOT VTI, S              52  cm    --------- LVOT peak gradient, S         17  mm Hg  ---------  Aortic valve              Value     Reference Aortic valve peak velocity, S     419  cm/s   --------- Aortic valve mean velocity, S     289  cm/s   --------- Aortic valve VTI, S          91.6 cm    --------- Aortic mean gradient, S        38  mm Hg  ---------  Aortic peak gradient, S        70  mm Hg  --------- VTI ratio, LVOT/AV           0.57      --------- Velocity ratio, peak, LVOT/AV     0.49      --------- Velocity ratio, mean, LVOT/AV     0.55      ---------  Aorta                 Value     Reference Aortic root ID, ED           30  mm    ---------  Left atrium              Value     Reference LA ID, A-P, ES             63  mm    --------- LA ID/bsa, A-P         (H)   3.08 cm/m^2  <=2.2 LA volume, S              166  ml    --------- LA volume/bsa, S            81.3 ml/m^2  --------- LA volume, ES, 1-p A4C         148  ml    --------- LA volume/bsa, ES, 1-p A4C       72.4 ml/m^2  --------- LA volume, ES, 1-p A2C         184  ml    --------- LA volume/bsa, ES, 1-p A2C       90.1 ml/m^2  ---------  Mitral valve               Value     Reference Mitral E-wave peak velocity      148  cm/s   --------- Mitral A-wave peak velocity      110  cm/s   --------- Mitral mean velocity, D        107  cm/s   --------- Mitral deceleration time    (H)   257  ms    150 - 230 Mitral pressure half-time       80  ms    --------- Mitral mean gradient, D        5   mm Hg  --------- Mitral peak gradient, D        9   mm Hg  --------- Mitral E/A ratio, peak         1.3      --------- Mitral valve area, PHT, DP       2.75 cm^2   --------- Mitral valve area/bsa, PHT, DP     1.35 cm^2/m^2 --------- Mitral annulus VTI, D         65.8 cm    ---------  Pulmonary arteries           Value     Reference PA pressure, S, DP       (H)   48  mm Hg  <=30  Tricuspid valve            Value     Reference Tricuspid regurg peak velocity     334  cm/s   --------- Tricuspid peak RV-RA gradient     45  mm Hg  ---------  Systemic veins             Value     Reference Estimated CVP  3   mm Hg  ---------  Right ventricle            Value     Reference RV pressure, S, DP       (H)   48  mm Hg  <=30 RV s&', lateral, S           16.3 cm/s   ---------  Legend: (L) and (H) mark values outside specified reference range.  ------------------------------------------------------------------- Michaelle Birks, MD 2016-05-09T13:06:24  Impression:  He is doing well following valve in valve TAVR with resolution of his shortness of breath and fatigue. He is now NYHA class I. His postop day 1 echo was read as showing a mean gradient of 38 mm Hg. I would have to question the accuracy of this since the gradient in the OR was very low. He will have a follow up echo in a  few weeks as part of routine TAVR follow up.  Plan:  He will see Dr. Burt Knack in a few weeks with an echo.   Gaye Pollack, MD [facility] [phone]

## 2014-06-09 ENCOUNTER — Other Ambulatory Visit (INDEPENDENT_AMBULATORY_CARE_PROVIDER_SITE_OTHER): Payer: Commercial Managed Care - HMO | Admitting: *Deleted

## 2014-06-09 ENCOUNTER — Ambulatory Visit: Payer: Commercial Managed Care - HMO | Admitting: Physician Assistant

## 2014-06-09 DIAGNOSIS — I350 Nonrheumatic aortic (valve) stenosis: Secondary | ICD-10-CM

## 2014-06-09 LAB — BASIC METABOLIC PANEL
BUN: 64 mg/dL — ABNORMAL HIGH (ref 6–23)
CO2: 23 meq/L (ref 19–32)
Calcium: 8.9 mg/dL (ref 8.4–10.5)
Chloride: 108 mEq/L (ref 96–112)
Creatinine, Ser: 2.29 mg/dL — ABNORMAL HIGH (ref 0.40–1.50)
GFR: 28.66 mL/min — ABNORMAL LOW (ref >60.00)
GLUCOSE: 103 mg/dL — AB (ref 70–99)
Potassium: 4.2 mEq/L (ref 3.5–5.1)
Sodium: 138 mEq/L (ref 135–145)

## 2014-06-10 ENCOUNTER — Telehealth: Payer: Self-pay | Admitting: Cardiovascular Disease

## 2014-06-10 NOTE — Telephone Encounter (Signed)
New message     Patient calling wants to know should he continue with cardiac rehab

## 2014-06-11 NOTE — Telephone Encounter (Signed)
I think considering his advanced age and comorbid medical illness, I probably would not recommend cardiac rehab. With that said, if he wants to do it I'd be happy to refer him.   thx

## 2014-06-11 NOTE — Telephone Encounter (Signed)
I spoke with the pt and he was never contacted to begin cardiac rehab after TAVR. The pt questioned if he even needed cardiac rehab at this time.  The pt is getting along well with his normal activities but he wants to make sure that Dr Burt Knack is okay with him not doing cardiac rehab at this time.

## 2014-06-11 NOTE — Telephone Encounter (Signed)
The pt does not want to participate in cardiac rehab if not necessary.

## 2014-06-21 NOTE — Assessment & Plan Note (Signed)
05/21/14  BUN, creatinine, and GFR  all assess kidney function. To protect the kidneys it  is important to control your blood pressure and sugar. You should also stay well hydrated. Drink to thirst, up to 32 ounces of fluids per day.  Progressive kidney impairment is typically associated with anemia which is unrelated to  iron deficiency or B12 deficiency.

## 2014-06-21 NOTE — Assessment & Plan Note (Signed)
Orthopedic referral.

## 2014-06-21 NOTE — Assessment & Plan Note (Signed)
05/21/14 patient clinically stable with no evidence of cardiac decompensation. Follow-up with surgeon

## 2014-06-21 NOTE — Assessment & Plan Note (Signed)
All the pancytopenia parameters are improving; no change indicated.  Dr. Para March will need to be aware of the thrombocytopenia should any invasive procedure be considered.

## 2014-06-22 ENCOUNTER — Other Ambulatory Visit: Payer: Self-pay

## 2014-06-22 ENCOUNTER — Ambulatory Visit (INDEPENDENT_AMBULATORY_CARE_PROVIDER_SITE_OTHER): Payer: Commercial Managed Care - HMO | Admitting: Cardiovascular Disease

## 2014-06-22 ENCOUNTER — Encounter: Payer: Self-pay | Admitting: Cardiovascular Disease

## 2014-06-22 ENCOUNTER — Ambulatory Visit (HOSPITAL_COMMUNITY): Payer: Commercial Managed Care - HMO | Attending: Cardiovascular Disease

## 2014-06-22 VITALS — BP 140/60 | HR 50 | Ht 70.0 in | Wt 185.2 lb

## 2014-06-22 DIAGNOSIS — Z87891 Personal history of nicotine dependence: Secondary | ICD-10-CM | POA: Insufficient documentation

## 2014-06-22 DIAGNOSIS — I350 Nonrheumatic aortic (valve) stenosis: Secondary | ICD-10-CM | POA: Insufficient documentation

## 2014-06-22 DIAGNOSIS — Z952 Presence of prosthetic heart valve: Secondary | ICD-10-CM

## 2014-06-22 DIAGNOSIS — I10 Essential (primary) hypertension: Secondary | ICD-10-CM | POA: Insufficient documentation

## 2014-06-22 DIAGNOSIS — Z954 Presence of other heart-valve replacement: Secondary | ICD-10-CM

## 2014-06-22 NOTE — Patient Instructions (Signed)
Medication Instructions:  Your physician recommends that you continue on your current medications as directed. Please refer to the Current Medication list given to you today.  Labwork: Your physician recommends that you have lab work today: BMP  Testing/Procedures: No new orders.   Follow-Up: Your physician wants you to follow-up in: 6 MONTHS with Dr Burt Knack.  You will receive a reminder letter in the mail two months in advance. If you don't receive a letter, please call our office to schedule the follow-up appointment.   Any Other Special Instructions Will Be Listed Below (If Applicable).

## 2014-06-22 NOTE — Progress Notes (Signed)
Cardiology Office Note   Date:  06/22/2014   ID:  RANGER PETRICH, DOB July 11, 1922, MRN [id number]  PCP:  [name], MD  Cardiologist:  [name], MD    Chief Complaint  Patient presents with  . Aortic Stenosis     History of Present Illness: [name] is a 81 y.o. male who presents for follow-up evaluation. He underwent TAVR via a right transfemoral approach using a 23 mm Sapien 3 valve on 05/14/2014. His postoperative course was uncomplicated. He had developed severe prosthetic valve dysfunction after initial treatment with a 25 mm Mosaic valve. It is notable that his early postoperative echo demonstrated increased velocities across his aortic valve. There was no residual paravalvular insufficiency.  From a clinical perspective, the patient is doing quite well. His breathing is markedly improved. He is back to all of his regular activities. He's having some problems with his right shoulder. Otherwise he is doing quite well. Leg swelling is improved from baseline. He denies orthopnea or PND. He still takes metolazone intermittently, but he is now using it less than once per week. He denies chest pain or pressure. He's had no bleeding problems.   Past Medical History  Diagnosis Date  . Stroke   . Atrial fibrillation     a. amiodarone therapy; not felt to be a candidate for anticoagulation - bruises easily.  . Bradycardia   . Aortic stenosis     a. Previously severe -> s/p minimally invasive tissue AVR with Dr. Evelina Dun at [facility] 01/2011 (pre-AVR cath with no obs CAD);  b. 05/2014 s/p TAVR (23 mm Edwards Sapien 3).  . Hypertension   . Anemia   . Action tremor   . Thrombocytopenia     Dr. Benay Spice  . Colon polyp   . Anxiety   . Splenomegaly   . Gout   . GERD (gastroesophageal reflux disease)   . Hypothyroidism   . Leukopenia     Chronic pancytopenia  . Degenerative disc disease   . Joint effusion, knee     left knee  . Synovial cyst of popliteal space   . Cellulitis  of left leg 10/11-16/2012  . CKD (chronic kidney disease), stage III   . Chronic diastolic CHF (congestive heart failure)     a. 12/15/2012 TEE: EF 60-65%, no veg.  . Bacteremia     a. 12/2012 - S bovis;  b. 12/2012 TEE w/o veg;  c. Seeing ID->Rocephin therapy extended to 01/25/2013 via PICC for possible endocarditis (No veg on TEE).  . High cholesterol   . Diabetes mellitus     "borderline" (01/05/2013)  . History of blood transfusion 01/2011; 11/2012  . H/O hiatal hernia   . Throat cancer     s/p lasered  . Diverticulosis   . Severe aortic insufficiency   . S/P TAVR (transcatheter aortic valve replacement)     a. 05/14/2014 TAVR: 23 mm Edwards Sapien 3 transcatheter heart valve placed valve-in-valve for prosthetic valve dysfunction via open right transfemoral approach    Past Surgical History  Procedure Laterality Date  . Cholecystectomy    . Joint effusion      left knee  . Aortic valve replacement  01/23/2011    via minimally invasive approach per Dr Evelina Dun, [facility]  . Tee without cardioversion N/A 12/15/2012    Procedure: TRANSESOPHAGEAL ECHOCARDIOGRAM (TEE);  Surgeon: [name], MD;  Location: [facility] ENDOSCOPY;  Service: Cardiovascular;  Laterality: N/A;  . Cardiac valve replacement    .  Inguinal hernia repair Right   . Excisional hemorrhoidectomy    . Cardiac catheterization    . Surgery scrotal / testicular      "removed one" (01/05/2013)  . 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 Tee without cardioversion N/A 03/26/2014    Procedure: TRANSESOPHAGEAL ECHOCARDIOGRAM (TEE);  Surgeon: Josue Hector, MD;  Location: [facility] ENDOSCOPY;  Service: Cardiovascular;  Laterality: N/A;  . Transcatheter aortic valve replacement, transfemoral Right 05/14/2014    Procedure: TRANSCATHETER AORTIC VALVE REPLACEMENT, TRANSFEMORAL;  Surgeon: [name],  MD;  Location: [place];  Service: Open Heart Surgery;  Laterality: Right;  . Tee without cardioversion N/A 05/14/2014    Procedure: TRANSESOPHAGEAL ECHOCARDIOGRAM (TEE);  Surgeon: [name], MD;  Location: [place];  Service: Open Heart Surgery;  Laterality: N/A;    Current Outpatient Prescriptions  Medication Sig Dispense Refill  . allopurinol (ZYLOPRIM) 100 MG tablet TAKE 1 TABLET EVERY DAY 90 tablet 0  . amiodarone (PACERONE) 200 MG tablet Take 1 tablet (200 mg total) by mouth daily. 90 tablet 1  . amLODipine (NORVASC) 5 MG tablet Take 1 tablet (5 mg total) by mouth daily. 90 tablet 1  . aspirin 325 MG tablet Take 325 mg by mouth daily.      . Cholecalciferol (VITAMIN D) 1000 UNITS capsule Take 1 capsule (1,000 Units total) by mouth daily. 90 capsule 3  . clonazePAM (KLONOPIN) 0.5 MG tablet 1/2 as needed if you wake up early (Patient taking differently: Take 0.5 mg by mouth daily as needed (wake up early). ) 30 tablet 1  . colchicine 0.6 MG tablet Take 0.6 mg by mouth daily as needed (gout flares). Take one tablet by mouth daily during an acute episode of gout.    . fish oil-omega-3 fatty acids 1000 MG capsule Take 1 g by mouth daily.      . Flaxseed, Linseed, (FLAX SEED OIL) 1000 MG CAPS Take 1 capsule by mouth daily.     . furosemide (LASIX) 80 MG tablet Take 80mg  in the morning and 40 mg in the evening 90 tablet 3  . levothyroxine (LEVOTHROID) 25 MCG tablet Take 1 tablet (25 mcg total) by mouth daily. 90 tablet 1  . metolazone (ZAROXOLYN) 2.5 MG tablet Take 2.5 mg by mouth daily as needed (sleep). DAILY AS NEEDED FOR EDEMA    . Multiple Vitamin (MULTIVITAMIN) tablet Take 1 tablet by mouth daily.      [name] Kitchen omeprazole (PRILOSEC) 20 MG capsule Take 1 capsule (20 mg total) by mouth daily. 90 capsule 1  . pantoprazole (PROTONIX) 40 MG tablet Take 40 mg by mouth daily.    . potassium chloride (K-DUR) 10 MEQ tablet Take 1 tablet (10 mEq total) by mouth daily. 90 tablet 1  . propranolol (INDERAL) 10  MG tablet Take 10 mg by mouth 2 (two) times daily.    . sertraline (ZOLOFT) 50 MG tablet Take 1 tablet (50 mg total) by mouth daily. 90 tablet 0  . vitamin C (ASCORBIC ACID) 500 MG tablet Take 1 tablet (500 mg total) by mouth daily. 90 tablet 3   No current facility-administered medications for this visit.   Facility-Administered Medications Ordered in Other Visits  Medication Dose Route Frequency Provider Last Rate Last Dose  . epoetin alfa (EPOGEN,PROCRIT) injection 40,000 Units  40,000 Units Subcutaneous Once Dominica Severin B  Benay Spice, MD        Allergies:   Penicillins and Neomycin-bacitracin zn-polymyx   Social History:  The patient  reports that he quit smoking about 52 years ago. His smoking use included Cigarettes. He has a 18 pack-year smoking history. He has never used smokeless tobacco. He reports that he does not drink alcohol or use illicit drugs.   Family History:  [name]  family history includes Cancer in his brother and other; Colon cancer in an other family member; Heart disease in his father; Lung cancer in his mother; Stroke in his other and another family member.    ROS:  Please see the history of present illness.  Otherwise, review of systems is positive for shoulder pain, easy bruising.  All other systems are reviewed and negative.    PHYSICAL EXAM: VS:  BP 140/60 mmHg  Pulse 50  Ht 5\' 10"  (1.778 m)  Wt 185 lb 3.2 oz (84.006 kg)  BMI 26.57 kg/m2 , BMI Body mass index is 26.57 kg/(m^2). GEN: Well nourished, well developed, elderly male in no acute distress HEENT: normal Neck: no JVD, no masses. Cardiac: RRR with grade 2/6 early-mid peaking systolic murmur at the RUSB, no diastolic murmur         Respiratory:  clear to auscultation bilaterally, normal work of breathing GI: soft, nontender, nondistended, + BS MS: no deformity or atrophy Ext: no pretibial edema Skin: warm and dry, no rash, chronic ecchymoses unchanged Neuro:  Strength and sensation are  intact Psych: euthymic mood, full affect  EKG:  EKG is not ordered today.  Recent Labs: 03/11/2014: TSH 5.15* 05/13/2014: ALT 33; B Natriuretic Peptide 642.8* 05/15/2014: Magnesium 2.2 05/21/2014: HGB 9.7*; Platelets 74* 06/09/2014: BUN 64*; Creatinine, Ser 2.29*; Potassium 4.2; Sodium 138   Lipid Panel     Component Value Date/Time   CHOL 84 09/21/2013 0809   TRIG 94.0 09/21/2013 0809   HDL 25.60* 09/21/2013 0809   CHOLHDL 3 09/21/2013 0809   VLDL 18.8 09/21/2013 0809   LDLCALC 40 09/21/2013 0809      Wt Readings from Last 3 Encounters:  06/22/14 185 lb 3.2 oz (84.006 kg)  06/02/14 191 lb (86.637 kg)  05/28/14 193 lb 3.2 oz (87.635 kg)    Cardiac Studies Reviewed: 2-D echocardiogram personally reviewed, official interpretation pending. LV function appears normal. There is no paravalvular aortic insufficiency. Transvalvular gradients are increased with peak velocities in excess of 4 m/s and mean transaortic valve gradients ranging from 46-51 mmHg.  ASSESSMENT AND PLAN: Aortic valve disease status post valve and valve TAVR for treatment of severe bioprosthetic valve dysfunction with congestive heart failure. The patient is clinically improved. He is New York Heart Association functional class I. It is interesting that his transvalvular velocities are significantly elevated. His heart murmur is early to mid peaking and sounds consistent with a typical aortic flow murmur status post valve replacement. I personally reviewed his echo images and the velocity envelopes do appear consistent with significant aortic stenosis. The patient will be followed clinically as he is doing well at this time. Will repeat an echocardiogram at one year follow-up. If he develops recurrent symptoms, it might be reasonable to consider a TEE to better define the reason for his increased transvalvular velocities. Considerations include leaflet dysfunction or subvalvular disease. It is a bit unusual that when we directly  measured the transaortic valve gradient at the time of TAVR, there was no significant residual gradient. He understands the need to follow SBE prophylaxis per  guidelines. His chronic diastolic CHF is stable and current therapies will be continued.    Current medicines are reviewed with the patient today.  The patient does not have concerns regarding medicines.  Labs/ tests ordered today include:   Orders Placed This Encounter  Procedures  . Basic Metabolic Panel (BMET)   Disposition:   FU 6 months  Signed, [name], MD  06/22/2014 5:05 PM    [facility] [address], [place], [place]  [address] Phone: [phone]; Fax: [phone]

## 2014-06-23 ENCOUNTER — Telehealth (HOSPITAL_COMMUNITY): Payer: Self-pay | Admitting: Cardiac Rehabilitation

## 2014-06-23 LAB — BASIC METABOLIC PANEL
BUN: 53 mg/dL — ABNORMAL HIGH (ref 6–23)
CALCIUM: 8.9 mg/dL (ref 8.4–10.5)
CO2: 24 mEq/L (ref 19–32)
CREATININE: 2.16 mg/dL — AB (ref 0.40–1.50)
Chloride: 109 mEq/L (ref 96–112)
GFR: 30.66 mL/min — ABNORMAL LOW (ref >60.00)
Glucose, Bld: 137 mg/dL — ABNORMAL HIGH (ref 70–99)
Potassium: 4.3 mEq/L (ref 3.5–5.1)
Sodium: 139 mEq/L (ref 135–145)

## 2014-06-23 NOTE — Telephone Encounter (Signed)
pc received from [place], Dr. Antionette Char nurse, who reports pt is not interested in cardiac rehab.

## 2014-06-28 ENCOUNTER — Encounter: Payer: Self-pay | Admitting: Cardiovascular Disease

## 2014-06-28 NOTE — Telephone Encounter (Signed)
New message ° ° ° ° °Returning a nurses call to get lab results °

## 2014-06-28 NOTE — Telephone Encounter (Signed)
This encounter was created in error - please disregard.

## 2014-06-28 NOTE — Telephone Encounter (Signed)
Left message on machine for pt to contact the office.   

## 2014-06-28 NOTE — Telephone Encounter (Signed)
F/u ° ° °Pt returning your call °

## 2014-07-02 ENCOUNTER — Telehealth: Payer: Self-pay | Admitting: Oncology

## 2014-07-02 ENCOUNTER — Ambulatory Visit (HOSPITAL_BASED_OUTPATIENT_CLINIC_OR_DEPARTMENT_OTHER): Payer: Commercial Managed Care - HMO | Admitting: Oncology

## 2014-07-02 ENCOUNTER — Other Ambulatory Visit (HOSPITAL_BASED_OUTPATIENT_CLINIC_OR_DEPARTMENT_OTHER): Payer: Commercial Managed Care - HMO

## 2014-07-02 VITALS — BP 150/53 | HR 48 | Temp 97.5°F | Resp 18 | Ht 70.0 in | Wt 188.2 lb

## 2014-07-02 DIAGNOSIS — D709 Neutropenia, unspecified: Secondary | ICD-10-CM | POA: Diagnosis not present

## 2014-07-02 DIAGNOSIS — N189 Chronic kidney disease, unspecified: Secondary | ICD-10-CM

## 2014-07-02 DIAGNOSIS — N183 Chronic kidney disease, stage 3 unspecified: Secondary | ICD-10-CM

## 2014-07-02 DIAGNOSIS — D631 Anemia in chronic kidney disease: Secondary | ICD-10-CM | POA: Diagnosis not present

## 2014-07-02 DIAGNOSIS — D61818 Other pancytopenia: Secondary | ICD-10-CM

## 2014-07-02 DIAGNOSIS — D47Z9 Other specified neoplasms of uncertain behavior of lymphoid, hematopoietic and related tissue: Secondary | ICD-10-CM

## 2014-07-02 DIAGNOSIS — N289 Disorder of kidney and ureter, unspecified: Secondary | ICD-10-CM | POA: Diagnosis not present

## 2014-07-02 LAB — CBC WITH DIFFERENTIAL/PLATELET
BASO%: 0 % (ref 0.0–2.0)
Basophils Absolute: 0 10*3/uL (ref 0.0–0.1)
EOS%: 1.6 % (ref 0.0–7.0)
Eosinophils Absolute: 0 10*3/uL (ref 0.0–0.5)
HCT: 26.4 % — ABNORMAL LOW (ref 38.4–49.9)
HGB: 8.5 g/dL — ABNORMAL LOW (ref 13.0–17.1)
LYMPH%: 17.5 % (ref 14.0–49.0)
MCH: 28.2 pg (ref 27.2–33.4)
MCHC: 32.2 g/dL (ref 32.0–36.0)
MCV: 87.7 fL (ref 79.3–98.0)
MONO#: 0.2 10*3/uL (ref 0.1–0.9)
MONO%: 7.9 % (ref 0.0–14.0)
NEUT#: 1.4 10*3/uL — ABNORMAL LOW (ref 1.5–6.5)
NEUT%: 73 % (ref 39.0–75.0)
NRBC: 0 % (ref 0–0)
Platelets: 27 10*3/uL — ABNORMAL LOW (ref 140–400)
RBC: 3.01 10*6/uL — AB (ref 4.20–5.82)
RDW: 19.6 % — AB (ref 11.0–14.6)
WBC: 1.9 10*3/uL — ABNORMAL LOW (ref 4.0–10.3)
lymph#: 0.3 10*3/uL — ABNORMAL LOW (ref 0.9–3.3)

## 2014-07-02 LAB — TECHNOLOGIST REVIEW

## 2014-07-02 MED ORDER — EPOETIN ALFA 20000 UNIT/ML IJ SOLN: 40000.0000 [IU] | Freq: Once | INTRAMUSCULAR | Status: DC

## 2014-07-02 MED ORDER — EPOETIN ALFA 40000 UNIT/ML IJ SOLN
40000.0000 [IU] | Freq: Once | INTRAMUSCULAR | Status: AC
  Administered 2014-07-02: 40000 [IU] via SUBCUTANEOUS
  Filled 2014-07-02: qty 1

## 2014-07-02 MED ORDER — EPOETIN ALFA 20000 UNIT/ML IJ SOLN
40000.0000 [IU] | Freq: Once | INTRAMUSCULAR | Status: DC
  Filled 2014-07-02: qty 2

## 2014-07-02 NOTE — Telephone Encounter (Signed)
Appointments made and avs printed for patient °

## 2014-07-02 NOTE — Progress Notes (Signed)
  [facility] OFFICE PROGRESS NOTE   Diagnosis: Pancytopenia  INTERVAL HISTORY:   [name] returns as scheduled. He complains of right shoulder discomfort related to "rotator cuff "disease. He has received injection therapy by orthopedics. There is a bruise surrounding the right shoulder. No other bleeding. He has mild leg swelling.  Objective:  Vital signs in last 24 hours:  Blood pressure 150/53, pulse 48, temperature 97.5 F (36.4 C), temperature source Oral, resp. rate 18, height _0  (1.778 m), weight 188 lb 3.2 oz (85.367 kg), SpO2 100 %.    HEENT: Small ecchymosis at the left buccal mucosa Resp: Lungs clear bilaterally Cardio: Regular rate and rhythm GI: The spleen is palpable in the left mid abdomen, no hepatomegaly Vascular: 1+ edema below the knee bilaterally Musculoskeletal: Pain with forward and posterior extension at the right shoulder  Skin: Scattered ecchymoses including a resolving ecchymosis at the right shoulder    Lab Results:  Lab Results  Component Value Date   WBC 1.9* 07/02/2014   HGB 8.5* 07/02/2014   HCT 26.4* 07/02/2014   MCV 87.7 07/02/2014   PLT 27* 07/02/2014   NEUTROABS 1.4* 07/02/2014     Medications: I have reviewed the patient's current medications.  Assessment/Plan: 1. Chronic pancytopenia/splenomegaly-likely related to a chronic lymphoproliferative disorder,? Splenic lymphoma   Bone marrow biopsy at [facility] November 2012-slightly hypercellular marrow with trilineage hematopoiesis, interstitial Nieman-Pick like histiocytosis. Negative for dysplasia, negative for lymphoma, negative for increased blasts. 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 History of severe aortic stenosis, status post aortic valve replacement surgery at [facility] on 01/23/2011; status post transcatheter aortic valve replacement 05/14/2014  3. History of gout  4. Diabetes  5. Streptococcus  bacteremia-TEE negative for endocarditis  6. Malaise-likely multifactorial 7. Renal insufficiency  8. Paroxysmal atrial fibrillation  9. Colon polyps noted on a virtual colonoscopy 02/10/2013  10. Anemia secondary to renal insufficiency and the chronic lymphoproliferative disorder. Trial of weekly erythropoietin initiated 04/09/2014 with improvement. The anemia has progressed. The plan is to resume erythropoietin.   Disposition:  [name] appears stable. The hemoglobin and platelets are lower since being taken off of erythropoietin and thrombopoietin therapy. The plan is to resume erythropoietin. He will begin ferrous sulfate twice daily. He will receive a dose of erythropoietin today and again on 07/20/2014. We will check iron stores when he is here on 07/20/2014.  [name] will return for an office visit 08/03/2014.  Betsy Coder, MD  07/02/2014  12:41 PM

## 2014-07-09 IMAGING — DX DG CHEST 1V PORT
1 series · 1 of 1 positions shown · non-contrast
Comparison: Chest radiograph December 12, 2012.

CLINICAL DATA: Irregular heart beat.

EXAM:
PORTABLE CHEST - 1 VIEW

[portable]
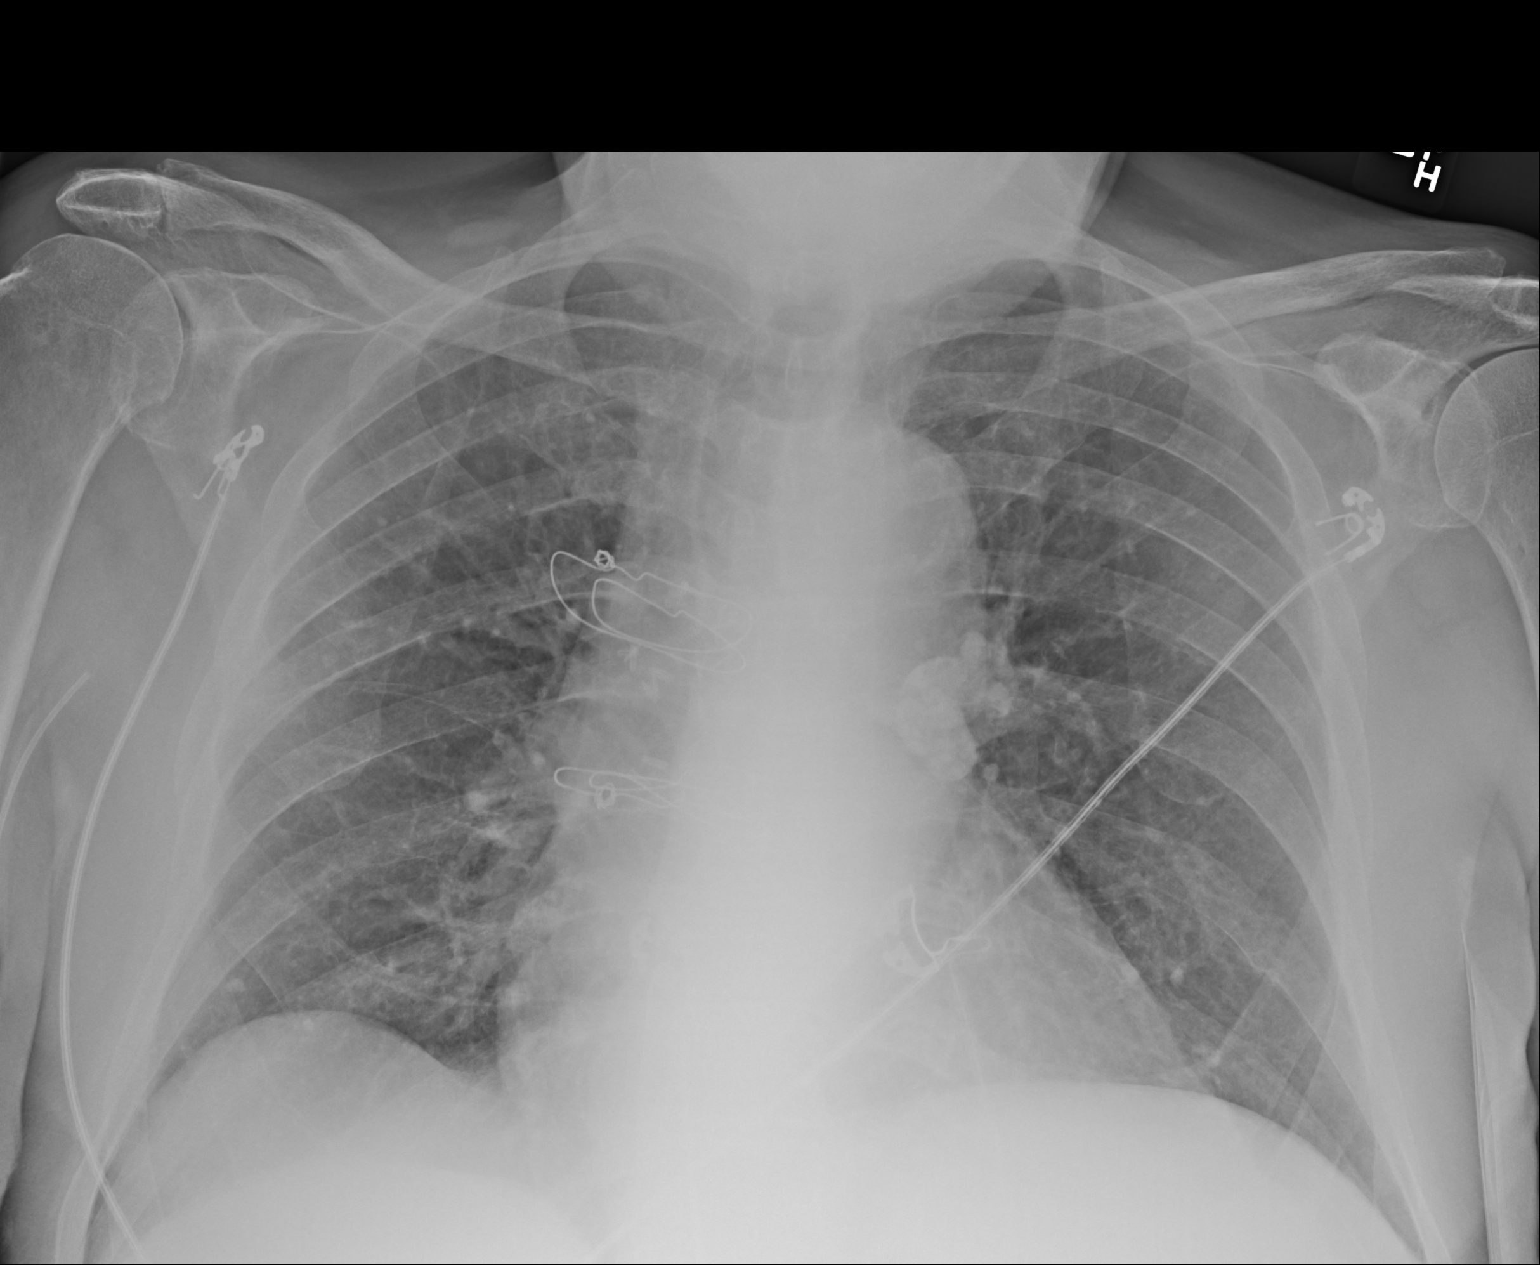

[1 of 1 positions shown; findings below may reference images not displayed]

FINDINGS: Cardiac silhouette appears mildly enlarged, even with consideration
to this low inspiratory portable examination with crowded
vasculature markings. Status post median sternotomy. Mild chronic
interstitial changes without pleural effusions or pleural focal
consolidations. Scattered granulomata with bulky left hilar
calcified lymph nodes. No pneumothorax.

Degenerative changes shoulders. Multiple EKG lines overlie the
patient and may obscure subtle underlying pathology. Remote left
posterior rib fractures. Apparent catheter projects in the included
right humerus soft tissues.
IMPRESSION: Stable cardiomegaly and mild chronic interstitial changes without
superimposed acute cardiopulmonary process.

Remote granulomatous changes.

Apparent catheter within the right humerus soft tissues, recommend
clinical correlation.

  By: Rchard Osandon

## 2014-07-14 ENCOUNTER — Ambulatory Visit: Payer: Commercial Managed Care - HMO | Admitting: Physician Assistant

## 2014-07-20 ENCOUNTER — Ambulatory Visit (HOSPITAL_BASED_OUTPATIENT_CLINIC_OR_DEPARTMENT_OTHER): Payer: Commercial Managed Care - HMO

## 2014-07-20 ENCOUNTER — Other Ambulatory Visit (HOSPITAL_BASED_OUTPATIENT_CLINIC_OR_DEPARTMENT_OTHER): Payer: Commercial Managed Care - HMO

## 2014-07-20 VITALS — BP 118/55 | HR 49 | Temp 97.7°F

## 2014-07-20 DIAGNOSIS — N289 Disorder of kidney and ureter, unspecified: Secondary | ICD-10-CM

## 2014-07-20 DIAGNOSIS — N189 Chronic kidney disease, unspecified: Principal | ICD-10-CM

## 2014-07-20 DIAGNOSIS — D631 Anemia in chronic kidney disease: Secondary | ICD-10-CM

## 2014-07-20 DIAGNOSIS — N183 Chronic kidney disease, stage 3 (moderate): Secondary | ICD-10-CM

## 2014-07-20 LAB — CBC WITH DIFFERENTIAL/PLATELET
BASO%: 0.6 % (ref 0.0–2.0)
Basophils Absolute: 0 10*3/uL (ref 0.0–0.1)
EOS%: 2.2 % (ref 0.0–7.0)
Eosinophils Absolute: 0 10*3/uL (ref 0.0–0.5)
HEMATOCRIT: 26.3 % — AB (ref 38.4–49.9)
HEMOGLOBIN: 8.3 g/dL — AB (ref 13.0–17.1)
LYMPH%: 16.8 % (ref 14.0–49.0)
MCH: 28.2 pg (ref 27.2–33.4)
MCHC: 31.6 g/dL — ABNORMAL LOW (ref 32.0–36.0)
MCV: 89.5 fL (ref 79.3–98.0)
MONO#: 0.1 10*3/uL (ref 0.1–0.9)
MONO%: 5.6 % (ref 0.0–14.0)
NEUT#: 1.3 10*3/uL — ABNORMAL LOW (ref 1.5–6.5)
NEUT%: 74.8 % (ref 39.0–75.0)
Platelets: 27 10*3/uL — ABNORMAL LOW (ref 140–400)
RBC: 2.94 10*6/uL — ABNORMAL LOW (ref 4.20–5.82)
RDW: 19.8 % — ABNORMAL HIGH (ref 11.0–14.6)
WBC: 1.8 10*3/uL — ABNORMAL LOW (ref 4.0–10.3)
lymph#: 0.3 10*3/uL — ABNORMAL LOW (ref 0.9–3.3)
nRBC: 0 % (ref 0–0)

## 2014-07-20 LAB — IRON AND TIBC CHCC
%SAT: 24 % (ref 20–55)
IRON: 76 ug/dL (ref 42–163)
TIBC: 316 ug/dL (ref 202–409)
UIBC: 240 ug/dL (ref 117–376)

## 2014-07-20 LAB — TECHNOLOGIST REVIEW

## 2014-07-20 LAB — FERRITIN CHCC: Ferritin: 90 ng/ml (ref 22–316)

## 2014-07-20 MED ORDER — EPOETIN ALFA 40000 UNIT/ML IJ SOLN
40000.0000 [IU] | Freq: Once | INTRAMUSCULAR | Status: AC
  Administered 2014-07-20: 40000 [IU] via SUBCUTANEOUS
  Filled 2014-07-20: qty 1

## 2014-07-21 ENCOUNTER — Other Ambulatory Visit: Payer: Self-pay | Admitting: Internal Medicine

## 2014-07-21 ENCOUNTER — Telehealth: Payer: Self-pay | Admitting: Emergency Medicine

## 2014-07-21 MED ORDER — SERTRALINE HCL 50 MG PO TABS: 50.0000 mg | ORAL_TABLET | Freq: Every day | ORAL | Status: DC

## 2014-07-21 MED ORDER — ALLOPURINOL 100 MG PO TABS
100.0000 mg | ORAL_TABLET | Freq: Every day | ORAL | Status: DC
Start: 2014-07-21 — End: 2014-12-06

## 2014-07-26 ENCOUNTER — Telehealth: Payer: Self-pay | Admitting: Internal Medicine

## 2014-07-26 ENCOUNTER — Other Ambulatory Visit: Payer: Self-pay | Admitting: Emergency Medicine

## 2014-07-26 MED ORDER — PROPRANOLOL HCL 10 MG PO TABS: 10.0000 mg | ORAL_TABLET | Freq: Two times a day (BID) | ORAL | Status: DC

## 2014-07-26 NOTE — Telephone Encounter (Signed)
Refill sent to [facility] for Propranolol

## 2014-07-26 NOTE — Telephone Encounter (Signed)
Patient is requesting propranolol to be sent to [facility] mail.

## 2014-07-27 ENCOUNTER — Ambulatory Visit (HOSPITAL_BASED_OUTPATIENT_CLINIC_OR_DEPARTMENT_OTHER): Payer: Commercial Managed Care - HMO

## 2014-07-27 ENCOUNTER — Other Ambulatory Visit (HOSPITAL_BASED_OUTPATIENT_CLINIC_OR_DEPARTMENT_OTHER): Payer: Commercial Managed Care - HMO

## 2014-07-27 VITALS — BP 126/48 | HR 47 | Temp 98.2°F

## 2014-07-27 DIAGNOSIS — D631 Anemia in chronic kidney disease: Secondary | ICD-10-CM

## 2014-07-27 DIAGNOSIS — N183 Chronic kidney disease, stage 3 (moderate): Secondary | ICD-10-CM | POA: Diagnosis not present

## 2014-07-27 DIAGNOSIS — N289 Disorder of kidney and ureter, unspecified: Secondary | ICD-10-CM

## 2014-07-27 DIAGNOSIS — N189 Chronic kidney disease, unspecified: Principal | ICD-10-CM

## 2014-07-27 LAB — CBC WITH DIFFERENTIAL/PLATELET
BASO%: 0.4 % (ref 0.0–2.0)
Basophils Absolute: 0 10*3/uL (ref 0.0–0.1)
EOS%: 2.5 % (ref 0.0–7.0)
Eosinophils Absolute: 0 10*3/uL (ref 0.0–0.5)
HEMATOCRIT: 27.6 % — AB (ref 38.4–49.9)
HGB: 9 g/dL — ABNORMAL LOW (ref 13.0–17.1)
LYMPH%: 14.8 % (ref 14.0–49.0)
MCH: 29.2 pg (ref 27.2–33.4)
MCHC: 32.5 g/dL (ref 32.0–36.0)
MCV: 89.8 fL (ref 79.3–98.0)
MONO#: 0.1 10*3/uL (ref 0.1–0.9)
MONO%: 6.7 % (ref 0.0–14.0)
NEUT#: 1.4 10*3/uL — ABNORMAL LOW (ref 1.5–6.5)
NEUT%: 75.6 % — ABNORMAL HIGH (ref 39.0–75.0)
Platelets: 29 10*3/uL — ABNORMAL LOW (ref 140–400)
RBC: 3.07 10*6/uL — ABNORMAL LOW (ref 4.20–5.82)
RDW: 20.2 % — ABNORMAL HIGH (ref 11.0–14.6)
WBC: 1.9 10*3/uL — ABNORMAL LOW (ref 4.0–10.3)
lymph#: 0.3 10*3/uL — ABNORMAL LOW (ref 0.9–3.3)

## 2014-07-27 MED ORDER — EPOETIN ALFA 40000 UNIT/ML IJ SOLN
40000.0000 [IU] | Freq: Once | INTRAMUSCULAR | Status: AC
  Administered 2014-07-27: 40000 [IU] via SUBCUTANEOUS
  Filled 2014-07-27: qty 1

## 2014-08-03 ENCOUNTER — Telehealth: Payer: Self-pay | Admitting: Nurse Practitioner

## 2014-08-03 ENCOUNTER — Telehealth: Payer: Self-pay | Admitting: Cardiovascular Disease

## 2014-08-03 ENCOUNTER — Ambulatory Visit (HOSPITAL_BASED_OUTPATIENT_CLINIC_OR_DEPARTMENT_OTHER): Payer: Commercial Managed Care - HMO | Admitting: Nurse Practitioner

## 2014-08-03 ENCOUNTER — Other Ambulatory Visit (HOSPITAL_BASED_OUTPATIENT_CLINIC_OR_DEPARTMENT_OTHER): Payer: Commercial Managed Care - HMO

## 2014-08-03 ENCOUNTER — Ambulatory Visit (HOSPITAL_BASED_OUTPATIENT_CLINIC_OR_DEPARTMENT_OTHER): Payer: Commercial Managed Care - HMO

## 2014-08-03 VITALS — BP 112/50 | HR 50 | Temp 98.3°F | Resp 18 | Ht 70.0 in | Wt 183.9 lb

## 2014-08-03 DIAGNOSIS — D61818 Other pancytopenia: Secondary | ICD-10-CM

## 2014-08-03 DIAGNOSIS — D631 Anemia in chronic kidney disease: Secondary | ICD-10-CM

## 2014-08-03 DIAGNOSIS — D638 Anemia in other chronic diseases classified elsewhere: Secondary | ICD-10-CM | POA: Diagnosis not present

## 2014-08-03 DIAGNOSIS — D696 Thrombocytopenia, unspecified: Secondary | ICD-10-CM

## 2014-08-03 DIAGNOSIS — R161 Splenomegaly, not elsewhere classified: Secondary | ICD-10-CM

## 2014-08-03 DIAGNOSIS — D47Z9 Other specified neoplasms of uncertain behavior of lymphoid, hematopoietic and related tissue: Secondary | ICD-10-CM | POA: Diagnosis not present

## 2014-08-03 DIAGNOSIS — N183 Chronic kidney disease, stage 3 unspecified: Secondary | ICD-10-CM

## 2014-08-03 DIAGNOSIS — N189 Chronic kidney disease, unspecified: Principal | ICD-10-CM

## 2014-08-03 DIAGNOSIS — N289 Disorder of kidney and ureter, unspecified: Secondary | ICD-10-CM

## 2014-08-03 LAB — CBC WITH DIFFERENTIAL/PLATELET
BASO%: 0 % (ref 0.0–2.0)
Basophils Absolute: 0 10*3/uL (ref 0.0–0.1)
EOS%: 1.9 % (ref 0.0–7.0)
Eosinophils Absolute: 0 10*3/uL (ref 0.0–0.5)
HEMATOCRIT: 27.1 % — AB (ref 38.4–49.9)
HGB: 8.6 g/dL — ABNORMAL LOW (ref 13.0–17.1)
LYMPH#: 0.3 10*3/uL — AB (ref 0.9–3.3)
LYMPH%: 20 % (ref 14.0–49.0)
MCH: 28.9 pg (ref 27.2–33.4)
MCHC: 31.7 g/dL — AB (ref 32.0–36.0)
MCV: 90.9 fL (ref 79.3–98.0)
MONO#: 0.1 10*3/uL (ref 0.1–0.9)
MONO%: 6.9 % (ref 0.0–14.0)
NEUT#: 1.1 10*3/uL — ABNORMAL LOW (ref 1.5–6.5)
NEUT%: 71.2 % (ref 39.0–75.0)
Platelets: 21 10*3/uL — ABNORMAL LOW (ref 140–400)
RBC: 2.98 10*6/uL — AB (ref 4.20–5.82)
RDW: 20.4 % — ABNORMAL HIGH (ref 11.0–14.6)
WBC: 1.6 10*3/uL — ABNORMAL LOW (ref 4.0–10.3)
nRBC: 0 % (ref 0–0)

## 2014-08-03 LAB — TECHNOLOGIST REVIEW

## 2014-08-03 MED ORDER — EPOETIN ALFA 20000 UNIT/ML IJ SOLN
40000.0000 [IU] | Freq: Once | INTRAMUSCULAR | Status: AC
  Administered 2014-08-03: 40000 [IU] via SUBCUTANEOUS
  Filled 2014-08-03: qty 2

## 2014-08-03 NOTE — Progress Notes (Signed)
  [place] OFFICE PROGRESS NOTE   Diagnosis:  Pancytopenia  INTERVAL HISTORY:   [name] returns as scheduled. He continues erythropoietin. He is taking iron. He reports a good appetite. No fever or other signs of infection. No spontaneous bleeding. He fell on 07/19/2014 and had bleeding related to abrasions on both elbows.  Objective:  Vital signs in last 24 hours:  Blood pressure 112/50, pulse 50, temperature 98.3 F (36.8 C), temperature source Oral, resp. rate 18, height $RemoveBe'5\' 10"'kIYEmeMiQ$  (1.778 m), weight 183 lb 14.4 oz (83.416 kg), SpO2 100 %.    HEENT: Small ecchymosis left buccal mucosa. Resp: Lungs clear bilaterally. Cardio: Regular rate and rhythm. GI: Abdomen is soft. Spleen palpable left mid abdomen with associated mild tenderness. No hepatomegaly. Vascular: Trace to 1+ edema below the knees bilaterally. Skin: Scattered ecchymoses.    Lab Results:  Lab Results  Component Value Date   WBC 1.6* 08/03/2014   HGB 8.6* 08/03/2014   HCT 27.1* 08/03/2014   MCV 90.9 08/03/2014   PLT 21* 08/03/2014   NEUTROABS 1.1* 08/03/2014    Imaging:  No results found.  Medications: I have reviewed the patient's current medications.  Assessment/Plan: 1. Chronic pancytopenia/splenomegaly-likely related to a chronic lymphoproliferative disorder,? Splenic lymphoma   Bone marrow biopsy at [facility] November 2012-slightly hypercellular marrow with trilineage hematopoiesis, interstitial Nieman-Pick like histiocytosis. Negative for dysplasia, negative for lymphoma, negative for increased blasts. 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 History of severe aortic stenosis, status post aortic valve replacement surgery at [facility] on 01/23/2011; status post transcatheter aortic valve replacement 05/14/2014  3. History of gout  4. Diabetes  5. Streptococcus bacteremia-TEE negative for endocarditis  6. Malaise-likely  multifactorial 7. Renal insufficiency  8. Paroxysmal atrial fibrillation  9. Colon polyps noted on a virtual colonoscopy 02/10/2013  10. Anemia secondary to renal insufficiency and the chronic lymphoproliferative disorder. Trial of weekly erythropoietin initiated 04/09/2014 with improvement. The anemia has progressed. Erythropoietin resumed 07/02/2014.   Disposition: [name] appears stable. Erythropoietin was resumed on 07/02/2014. There has been no significant change in the hemoglobin. Plan to continue erythropoietin on a weekly schedule for 3 more injections and then reevaluate. He will return for a follow-up visit on 08/24/2014. He will contact the office in the interim with any problems.  Plan reviewed with Dr. Benay Spice.    Ned Card ANP/GNP-BC   08/03/2014  10:19 AM

## 2014-08-03 NOTE — Telephone Encounter (Signed)
Pt confirmed labs/ov/inj per 07/26 POF, gave pt AVS and Calendar... KJ

## 2014-08-03 NOTE — Telephone Encounter (Signed)
agreed

## 2014-08-03 NOTE — Telephone Encounter (Signed)
New message     Pt said his heart rate and been between 45-55 since 07-27-14.  His bp has been 128/49 and 150/52.  He had labs drawn today and was told his HR was low.  Please advise

## 2014-08-03 NOTE — Telephone Encounter (Signed)
I reviewed the pt's EKGs and his pulse runs around 49-51.  I spoke with the pt and he is not having any symptoms related to decreased pulse.  I advised the pt to continue taking his current medications at this time. Pt agreed with plan.

## 2014-08-10 ENCOUNTER — Other Ambulatory Visit (HOSPITAL_BASED_OUTPATIENT_CLINIC_OR_DEPARTMENT_OTHER): Payer: Commercial Managed Care - HMO

## 2014-08-10 ENCOUNTER — Ambulatory Visit (HOSPITAL_BASED_OUTPATIENT_CLINIC_OR_DEPARTMENT_OTHER): Payer: Commercial Managed Care - HMO

## 2014-08-10 VITALS — BP 123/48 | HR 48 | Temp 98.5°F

## 2014-08-10 DIAGNOSIS — D696 Thrombocytopenia, unspecified: Secondary | ICD-10-CM

## 2014-08-10 DIAGNOSIS — D631 Anemia in chronic kidney disease: Secondary | ICD-10-CM

## 2014-08-10 DIAGNOSIS — R161 Splenomegaly, not elsewhere classified: Secondary | ICD-10-CM

## 2014-08-10 DIAGNOSIS — N289 Disorder of kidney and ureter, unspecified: Secondary | ICD-10-CM

## 2014-08-10 DIAGNOSIS — N183 Chronic kidney disease, stage 3 unspecified: Secondary | ICD-10-CM

## 2014-08-10 DIAGNOSIS — D61818 Other pancytopenia: Secondary | ICD-10-CM

## 2014-08-10 LAB — CBC WITH DIFFERENTIAL/PLATELET
BASO%: 0 % (ref 0.0–2.0)
Basophils Absolute: 0 10*3/uL (ref 0.0–0.1)
EOS%: 3.2 % (ref 0.0–7.0)
Eosinophils Absolute: 0.1 10*3/uL (ref 0.0–0.5)
HCT: 28.8 % — ABNORMAL LOW (ref 38.4–49.9)
HGB: 9.1 g/dL — ABNORMAL LOW (ref 13.0–17.1)
LYMPH#: 0.3 10*3/uL — AB (ref 0.9–3.3)
LYMPH%: 19.2 % (ref 14.0–49.0)
MCH: 29.1 pg (ref 27.2–33.4)
MCHC: 31.6 g/dL — ABNORMAL LOW (ref 32.0–36.0)
MCV: 92 fL (ref 79.3–98.0)
MONO#: 0.1 10*3/uL (ref 0.1–0.9)
MONO%: 5.8 % (ref 0.0–14.0)
NEUT#: 1.1 10*3/uL — ABNORMAL LOW (ref 1.5–6.5)
NEUT%: 71.8 % (ref 39.0–75.0)
NRBC: 0 % (ref 0–0)
PLATELETS: 23 10*3/uL — AB (ref 140–400)
RBC: 3.13 10*6/uL — ABNORMAL LOW (ref 4.20–5.82)
RDW: 19.5 % — ABNORMAL HIGH (ref 11.0–14.6)
WBC: 1.6 10*3/uL — ABNORMAL LOW (ref 4.0–10.3)

## 2014-08-10 MED ORDER — EPOETIN ALFA 40000 UNIT/ML IJ SOLN
40000.0000 [IU] | Freq: Once | INTRAMUSCULAR | Status: AC
  Administered 2014-08-10: 40000 [IU] via SUBCUTANEOUS
  Filled 2014-08-10: qty 1

## 2014-08-17 ENCOUNTER — Ambulatory Visit (HOSPITAL_BASED_OUTPATIENT_CLINIC_OR_DEPARTMENT_OTHER): Payer: Commercial Managed Care - HMO

## 2014-08-17 ENCOUNTER — Other Ambulatory Visit: Payer: Self-pay | Admitting: Oncology

## 2014-08-17 ENCOUNTER — Other Ambulatory Visit (HOSPITAL_BASED_OUTPATIENT_CLINIC_OR_DEPARTMENT_OTHER): Payer: Commercial Managed Care - HMO

## 2014-08-17 VITALS — BP 119/51 | HR 45 | Temp 97.9°F

## 2014-08-17 DIAGNOSIS — R161 Splenomegaly, not elsewhere classified: Secondary | ICD-10-CM

## 2014-08-17 DIAGNOSIS — D696 Thrombocytopenia, unspecified: Secondary | ICD-10-CM

## 2014-08-17 DIAGNOSIS — D61818 Other pancytopenia: Secondary | ICD-10-CM

## 2014-08-17 DIAGNOSIS — N289 Disorder of kidney and ureter, unspecified: Secondary | ICD-10-CM

## 2014-08-17 DIAGNOSIS — N183 Chronic kidney disease, stage 3 unspecified: Secondary | ICD-10-CM

## 2014-08-17 DIAGNOSIS — D631 Anemia in chronic kidney disease: Secondary | ICD-10-CM

## 2014-08-17 LAB — CBC WITH DIFFERENTIAL/PLATELET
BASO%: 0 % (ref 0.0–2.0)
BASOS ABS: 0 10*3/uL (ref 0.0–0.1)
EOS%: 1.8 % (ref 0.0–7.0)
Eosinophils Absolute: 0 10*3/uL (ref 0.0–0.5)
HEMATOCRIT: 28.9 % — AB (ref 38.4–49.9)
HGB: 9.2 g/dL — ABNORMAL LOW (ref 13.0–17.1)
LYMPH%: 16.3 % (ref 14.0–49.0)
MCH: 29.2 pg (ref 27.2–33.4)
MCHC: 31.8 g/dL — ABNORMAL LOW (ref 32.0–36.0)
MCV: 91.7 fL (ref 79.3–98.0)
MONO#: 0.1 10*3/uL (ref 0.1–0.9)
MONO%: 7.2 % (ref 0.0–14.0)
NEUT%: 74.7 % (ref 39.0–75.0)
NEUTROS ABS: 1.2 10*3/uL — AB (ref 1.5–6.5)
NRBC: 0 % (ref 0–0)
PLATELETS: 24 10*3/uL — AB (ref 140–400)
RBC: 3.15 10*6/uL — AB (ref 4.20–5.82)
RDW: 19.3 % — ABNORMAL HIGH (ref 11.0–14.6)
WBC: 1.7 10*3/uL — ABNORMAL LOW (ref 4.0–10.3)
lymph#: 0.3 10*3/uL — ABNORMAL LOW (ref 0.9–3.3)

## 2014-08-17 MED ORDER — EPOETIN ALFA 20000 UNIT/ML IJ SOLN
40000.0000 [IU] | Freq: Once | INTRAMUSCULAR | Status: AC
  Administered 2014-08-17: 40000 [IU] via SUBCUTANEOUS
  Filled 2014-08-17: qty 2

## 2014-08-24 ENCOUNTER — Ambulatory Visit (HOSPITAL_BASED_OUTPATIENT_CLINIC_OR_DEPARTMENT_OTHER): Payer: Commercial Managed Care - HMO

## 2014-08-24 ENCOUNTER — Other Ambulatory Visit (HOSPITAL_BASED_OUTPATIENT_CLINIC_OR_DEPARTMENT_OTHER): Payer: Commercial Managed Care - HMO

## 2014-08-24 ENCOUNTER — Ambulatory Visit (HOSPITAL_BASED_OUTPATIENT_CLINIC_OR_DEPARTMENT_OTHER): Payer: Commercial Managed Care - HMO | Admitting: Nurse Practitioner

## 2014-08-24 VITALS — BP 153/48 | HR 45 | Temp 97.9°F | Resp 18 | Ht 70.0 in | Wt 178.8 lb

## 2014-08-24 DIAGNOSIS — N189 Chronic kidney disease, unspecified: Secondary | ICD-10-CM

## 2014-08-24 DIAGNOSIS — D649 Anemia, unspecified: Secondary | ICD-10-CM

## 2014-08-24 DIAGNOSIS — D61818 Other pancytopenia: Secondary | ICD-10-CM

## 2014-08-24 DIAGNOSIS — N183 Chronic kidney disease, stage 3 unspecified: Secondary | ICD-10-CM

## 2014-08-24 DIAGNOSIS — D479 Neoplasm of uncertain behavior of lymphoid, hematopoietic and related tissue, unspecified: Secondary | ICD-10-CM

## 2014-08-24 DIAGNOSIS — D631 Anemia in chronic kidney disease: Secondary | ICD-10-CM

## 2014-08-24 DIAGNOSIS — D696 Thrombocytopenia, unspecified: Secondary | ICD-10-CM

## 2014-08-24 DIAGNOSIS — R161 Splenomegaly, not elsewhere classified: Secondary | ICD-10-CM

## 2014-08-24 DIAGNOSIS — N289 Disorder of kidney and ureter, unspecified: Secondary | ICD-10-CM

## 2014-08-24 LAB — CBC WITH DIFFERENTIAL/PLATELET
BASO%: 0.4 % (ref 0.0–2.0)
BASOS ABS: 0 10*3/uL (ref 0.0–0.1)
EOS ABS: 0.1 10*3/uL (ref 0.0–0.5)
EOS%: 2.3 % (ref 0.0–7.0)
HCT: 30.5 % — ABNORMAL LOW (ref 38.4–49.9)
HGB: 9.9 g/dL — ABNORMAL LOW (ref 13.0–17.1)
LYMPH%: 18.3 % (ref 14.0–49.0)
MCH: 29.4 pg (ref 27.2–33.4)
MCHC: 32.5 g/dL (ref 32.0–36.0)
MCV: 90.5 fL (ref 79.3–98.0)
MONO#: 0.2 10*3/uL (ref 0.1–0.9)
MONO%: 6.2 % (ref 0.0–14.0)
NEUT#: 1.9 10*3/uL (ref 1.5–6.5)
NEUT%: 72.8 % (ref 39.0–75.0)
PLATELETS: 25 10*3/uL — AB (ref 140–400)
RBC: 3.37 10*6/uL — AB (ref 4.20–5.82)
RDW: 18.7 % — ABNORMAL HIGH (ref 11.0–14.6)
WBC: 2.6 10*3/uL — ABNORMAL LOW (ref 4.0–10.3)
lymph#: 0.5 10*3/uL — ABNORMAL LOW (ref 0.9–3.3)
nRBC: 0 % (ref 0–0)

## 2014-08-24 LAB — TECHNOLOGIST REVIEW

## 2014-08-24 MED ORDER — EPOETIN ALFA 40000 UNIT/ML IJ SOLN
40000.0000 [IU] | Freq: Once | INTRAMUSCULAR | Status: AC
  Administered 2014-08-24: 40000 [IU] via SUBCUTANEOUS
  Filled 2014-08-24: qty 1

## 2014-08-24 NOTE — Progress Notes (Signed)
  [name] OFFICE PROGRESS NOTE   Diagnosis:  Pancytopenia  INTERVAL HISTORY:   [name] returns as scheduled. He continues weekly erythropoietin. He overall feels well. He notes a decrease in his weight coinciding with improvement in the leg edema. He continues Lasix. He notes bleeding with minor trauma/abrasions. He bruises easily.  Objective:  Vital signs in last 24 hours:  Blood pressure 153/48, pulse 45, temperature 97.9 F (36.6 C), temperature source Oral, resp. rate 18, height 5' 10" (1.778 m), weight 178 lb 12.8 oz (81.103 kg), SpO2 100 %.    HEENT: No thrush or ulcers. Resp: Lungs clear bilaterally. Cardio: Regular rate and rhythm. GI: Abdomen is soft and nontender. Spleen palpable left mid abdomen. No hepatomegaly. Vascular: Trace lower leg edema bilaterally Skin: Scattered ecchymoses.    Lab Results:  Lab Results  Component Value Date   WBC 2.6* 08/24/2014   HGB 9.9* 08/24/2014   HCT 30.5* 08/24/2014   MCV 90.5 08/24/2014   PLT 25* 08/24/2014   NEUTROABS 1.9 08/24/2014    Imaging:  No results found.  Medications: I have reviewed the patient's current medications.  Assessment/Plan: Chronic pancytopenia/splenomegaly-likely related to a chronic lymphoproliferative disorder,? Splenic lymphoma   Bone marrow biopsy at [facility] November 2012-slightly hypercellular marrow with trilineage hematopoiesis, interstitial Nieman-Pick like histiocytosis. Negative for dysplasia, negative for lymphoma, negative for increased blasts. 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 History of severe aortic stenosis, status post aortic valve replacement surgery at [facility] on 01/23/2011; status post transcatheter aortic valve replacement 05/14/2014  3. History of gout  4. Diabetes  5. Streptococcus bacteremia-TEE negative for endocarditis  6. Malaise-likely multifactorial 7. Renal insufficiency  8. Paroxysmal  atrial fibrillation  9. Colon polyps noted on a virtual colonoscopy 02/10/2013  10. Anemia secondary to renal insufficiency and the chronic lymphoproliferative disorder. Trial of weekly erythropoietin initiated 04/09/2014 with improvement. The anemia progressed. Erythropoietin resumed 07/02/2014.   Disposition: [name] hemoglobin is better. Dr. Benay Spice recommends continuation of erythropoietin on a weekly schedule. We will see him in follow-up on 09/29/2014. He will contact the office in the interim with any problems.  Plan reviewed with Dr. Benay Spice.    Ned Card ANP/GNP-BC   08/24/2014  11:00 AM

## 2014-08-31 ENCOUNTER — Other Ambulatory Visit (HOSPITAL_BASED_OUTPATIENT_CLINIC_OR_DEPARTMENT_OTHER): Payer: Commercial Managed Care - HMO

## 2014-08-31 ENCOUNTER — Ambulatory Visit (HOSPITAL_BASED_OUTPATIENT_CLINIC_OR_DEPARTMENT_OTHER): Payer: Commercial Managed Care - HMO

## 2014-08-31 VITALS — BP 132/49 | HR 46 | Temp 97.9°F

## 2014-08-31 DIAGNOSIS — N183 Chronic kidney disease, stage 3 unspecified: Secondary | ICD-10-CM

## 2014-08-31 DIAGNOSIS — D631 Anemia in chronic kidney disease: Secondary | ICD-10-CM | POA: Diagnosis not present

## 2014-08-31 DIAGNOSIS — D696 Thrombocytopenia, unspecified: Secondary | ICD-10-CM

## 2014-08-31 DIAGNOSIS — N289 Disorder of kidney and ureter, unspecified: Secondary | ICD-10-CM

## 2014-08-31 DIAGNOSIS — D649 Anemia, unspecified: Secondary | ICD-10-CM

## 2014-08-31 LAB — CBC WITH DIFFERENTIAL/PLATELET
BASO%: 0 % (ref 0.0–2.0)
BASOS ABS: 0 10*3/uL (ref 0.0–0.1)
EOS%: 2 % (ref 0.0–7.0)
Eosinophils Absolute: 0 10*3/uL (ref 0.0–0.5)
HCT: 30.1 % — ABNORMAL LOW (ref 38.4–49.9)
HGB: 9.5 g/dL — ABNORMAL LOW (ref 13.0–17.1)
LYMPH#: 0.3 10*3/uL — AB (ref 0.9–3.3)
LYMPH%: 20.7 % (ref 14.0–49.0)
MCH: 29.4 pg (ref 27.2–33.4)
MCHC: 31.6 g/dL — AB (ref 32.0–36.0)
MCV: 93.2 fL (ref 79.3–98.0)
MONO#: 0.1 10*3/uL (ref 0.1–0.9)
MONO%: 5.3 % (ref 0.0–14.0)
NEUT#: 1.1 10*3/uL — ABNORMAL LOW (ref 1.5–6.5)
NEUT%: 72 % (ref 39.0–75.0)
Platelets: 23 10*3/uL — ABNORMAL LOW (ref 140–400)
RBC: 3.23 10*6/uL — AB (ref 4.20–5.82)
RDW: 18.1 % — AB (ref 11.0–14.6)
WBC: 1.5 10*3/uL — ABNORMAL LOW (ref 4.0–10.3)
nRBC: 0 % (ref 0–0)

## 2014-08-31 MED ORDER — EPOETIN ALFA 40000 UNIT/ML IJ SOLN
40000.0000 [IU] | Freq: Once | INTRAMUSCULAR | Status: AC
  Administered 2014-08-31: 40000 [IU] via SUBCUTANEOUS
  Filled 2014-08-31: qty 1

## 2014-08-31 NOTE — Patient Instructions (Signed)

## 2014-09-02 ENCOUNTER — Telehealth: Payer: Self-pay | Admitting: Internal Medicine

## 2014-09-02 NOTE — Telephone Encounter (Signed)
Patient is requesting [facility] referral to Dr. Prudencio Burly - opthalmologist.  Patient has appointment scheduled 9/14 at  1:15 for a routine eye exam.

## 2014-09-07 ENCOUNTER — Ambulatory Visit (HOSPITAL_BASED_OUTPATIENT_CLINIC_OR_DEPARTMENT_OTHER): Payer: Commercial Managed Care - HMO

## 2014-09-07 ENCOUNTER — Other Ambulatory Visit (HOSPITAL_BASED_OUTPATIENT_CLINIC_OR_DEPARTMENT_OTHER): Payer: Commercial Managed Care - HMO

## 2014-09-07 VITALS — BP 123/54 | HR 44 | Temp 98.0°F

## 2014-09-07 DIAGNOSIS — N289 Disorder of kidney and ureter, unspecified: Secondary | ICD-10-CM

## 2014-09-07 DIAGNOSIS — D631 Anemia in chronic kidney disease: Secondary | ICD-10-CM | POA: Diagnosis not present

## 2014-09-07 DIAGNOSIS — N183 Chronic kidney disease, stage 3 unspecified: Secondary | ICD-10-CM

## 2014-09-07 DIAGNOSIS — D696 Thrombocytopenia, unspecified: Secondary | ICD-10-CM

## 2014-09-07 DIAGNOSIS — D649 Anemia, unspecified: Secondary | ICD-10-CM

## 2014-09-07 LAB — CBC WITH DIFFERENTIAL/PLATELET
BASO%: 0 % (ref 0.0–2.0)
BASOS ABS: 0 10*3/uL (ref 0.0–0.1)
EOS%: 1.6 % (ref 0.0–7.0)
Eosinophils Absolute: 0 10*3/uL (ref 0.0–0.5)
HEMATOCRIT: 30.7 % — AB (ref 38.4–49.9)
HEMOGLOBIN: 9.9 g/dL — AB (ref 13.0–17.1)
LYMPH#: 0.4 10*3/uL — AB (ref 0.9–3.3)
LYMPH%: 18.6 % (ref 14.0–49.0)
MCH: 29.7 pg (ref 27.2–33.4)
MCHC: 32.2 g/dL (ref 32.0–36.0)
MCV: 92.2 fL (ref 79.3–98.0)
MONO#: 0.1 10*3/uL (ref 0.1–0.9)
MONO%: 5.9 % (ref 0.0–14.0)
NEUT#: 1.4 10*3/uL — ABNORMAL LOW (ref 1.5–6.5)
NEUT%: 73.9 % (ref 39.0–75.0)
Platelets: 25 10*3/uL — ABNORMAL LOW (ref 140–400)
RBC: 3.33 10*6/uL — ABNORMAL LOW (ref 4.20–5.82)
RDW: 17.5 % — ABNORMAL HIGH (ref 11.0–14.6)
WBC: 1.9 10*3/uL — ABNORMAL LOW (ref 4.0–10.3)
nRBC: 0 % (ref 0–0)

## 2014-09-07 MED ORDER — EPOETIN ALFA 40000 UNIT/ML IJ SOLN
40000.0000 [IU] | Freq: Once | INTRAMUSCULAR | Status: AC
  Administered 2014-09-07: 40000 [IU] via SUBCUTANEOUS
  Filled 2014-09-07: qty 1

## 2014-09-07 NOTE — Telephone Encounter (Signed)
Mcarthur Rossetti Josem Kaufmann  #[id number] valid 09/22/2014 - 03/21/2015 for 6 visits

## 2014-09-14 ENCOUNTER — Ambulatory Visit (HOSPITAL_BASED_OUTPATIENT_CLINIC_OR_DEPARTMENT_OTHER): Payer: Commercial Managed Care - HMO

## 2014-09-14 ENCOUNTER — Other Ambulatory Visit (HOSPITAL_BASED_OUTPATIENT_CLINIC_OR_DEPARTMENT_OTHER): Payer: Commercial Managed Care - HMO

## 2014-09-14 ENCOUNTER — Other Ambulatory Visit: Payer: Self-pay | Admitting: Nurse Practitioner

## 2014-09-14 VITALS — BP 121/50 | HR 45 | Temp 97.8°F

## 2014-09-14 DIAGNOSIS — D649 Anemia, unspecified: Secondary | ICD-10-CM

## 2014-09-14 DIAGNOSIS — N183 Chronic kidney disease, stage 3 unspecified: Secondary | ICD-10-CM

## 2014-09-14 DIAGNOSIS — D631 Anemia in chronic kidney disease: Secondary | ICD-10-CM

## 2014-09-14 DIAGNOSIS — D696 Thrombocytopenia, unspecified: Secondary | ICD-10-CM

## 2014-09-14 DIAGNOSIS — N289 Disorder of kidney and ureter, unspecified: Secondary | ICD-10-CM

## 2014-09-14 LAB — CBC WITH DIFFERENTIAL/PLATELET
BASO%: 0 % (ref 0.0–2.0)
Basophils Absolute: 0 10*3/uL (ref 0.0–0.1)
EOS%: 1.3 % (ref 0.0–7.0)
Eosinophils Absolute: 0 10*3/uL (ref 0.0–0.5)
HCT: 29.6 % — ABNORMAL LOW (ref 38.4–49.9)
HEMOGLOBIN: 9.4 g/dL — AB (ref 13.0–17.1)
LYMPH#: 0.3 10*3/uL — AB (ref 0.9–3.3)
LYMPH%: 18.6 % (ref 14.0–49.0)
MCH: 29.5 pg (ref 27.2–33.4)
MCHC: 31.8 g/dL — ABNORMAL LOW (ref 32.0–36.0)
MCV: 92.8 fL (ref 79.3–98.0)
MONO#: 0.1 10*3/uL (ref 0.1–0.9)
MONO%: 5.1 % (ref 0.0–14.0)
NEUT%: 75 % (ref 39.0–75.0)
NEUTROS ABS: 1.2 10*3/uL — AB (ref 1.5–6.5)
NRBC: 0 % (ref 0–0)
Platelets: 22 10*3/uL — ABNORMAL LOW (ref 140–400)
RBC: 3.19 10*6/uL — ABNORMAL LOW (ref 4.20–5.82)
RDW: 17.4 % — AB (ref 11.0–14.6)
WBC: 1.6 10*3/uL — ABNORMAL LOW (ref 4.0–10.3)

## 2014-09-14 MED ORDER — EPOETIN ALFA 40000 UNIT/ML IJ SOLN
40000.0000 [IU] | Freq: Once | INTRAMUSCULAR | Status: AC
  Administered 2014-09-14: 40000 [IU] via SUBCUTANEOUS
  Filled 2014-09-14: qty 1

## 2014-09-15 ENCOUNTER — Other Ambulatory Visit: Payer: Self-pay | Admitting: Internal Medicine

## 2014-09-15 ENCOUNTER — Other Ambulatory Visit: Payer: Self-pay | Admitting: Cardiovascular Disease

## 2014-09-17 ENCOUNTER — Other Ambulatory Visit: Payer: Self-pay | Admitting: Internal Medicine

## 2014-09-21 ENCOUNTER — Other Ambulatory Visit (HOSPITAL_BASED_OUTPATIENT_CLINIC_OR_DEPARTMENT_OTHER): Payer: Commercial Managed Care - HMO

## 2014-09-21 ENCOUNTER — Ambulatory Visit (HOSPITAL_BASED_OUTPATIENT_CLINIC_OR_DEPARTMENT_OTHER): Payer: Commercial Managed Care - HMO

## 2014-09-21 VITALS — BP 125/53 | HR 47 | Temp 97.9°F

## 2014-09-21 DIAGNOSIS — N183 Chronic kidney disease, stage 3 unspecified: Secondary | ICD-10-CM

## 2014-09-21 DIAGNOSIS — D649 Anemia, unspecified: Secondary | ICD-10-CM

## 2014-09-21 DIAGNOSIS — D631 Anemia in chronic kidney disease: Secondary | ICD-10-CM | POA: Diagnosis not present

## 2014-09-21 DIAGNOSIS — N289 Disorder of kidney and ureter, unspecified: Secondary | ICD-10-CM

## 2014-09-21 DIAGNOSIS — D696 Thrombocytopenia, unspecified: Secondary | ICD-10-CM

## 2014-09-21 LAB — CBC WITH DIFFERENTIAL/PLATELET
BASO%: 0 % (ref 0.0–2.0)
Basophils Absolute: 0 10*3/uL (ref 0.0–0.1)
EOS%: 1.4 % (ref 0.0–7.0)
Eosinophils Absolute: 0 10*3/uL (ref 0.0–0.5)
HCT: 31 % — ABNORMAL LOW (ref 38.4–49.9)
HGB: 9.8 g/dL — ABNORMAL LOW (ref 13.0–17.1)
LYMPH%: 18.8 % (ref 14.0–49.0)
MCH: 29.3 pg (ref 27.2–33.4)
MCHC: 31.6 g/dL — ABNORMAL LOW (ref 32.0–36.0)
MCV: 92.8 fL (ref 79.3–98.0)
MONO#: 0.1 10*3/uL (ref 0.1–0.9)
MONO%: 6.5 % (ref 0.0–14.0)
NEUT#: 1 10*3/uL — ABNORMAL LOW (ref 1.5–6.5)
NEUT%: 73.3 % (ref 39.0–75.0)
Platelets: 21 10*3/uL — ABNORMAL LOW (ref 140–400)
RBC: 3.34 10*6/uL — ABNORMAL LOW (ref 4.20–5.82)
RDW: 17.4 % — ABNORMAL HIGH (ref 11.0–14.6)
WBC: 1.4 10*3/uL — ABNORMAL LOW (ref 4.0–10.3)
lymph#: 0.3 10*3/uL — ABNORMAL LOW (ref 0.9–3.3)
nRBC: 2 % — ABNORMAL HIGH (ref 0–0)

## 2014-09-21 LAB — TECHNOLOGIST REVIEW

## 2014-09-21 MED ORDER — EPOETIN ALFA 40000 UNIT/ML IJ SOLN
40000.0000 [IU] | Freq: Once | INTRAMUSCULAR | Status: AC
  Administered 2014-09-21: 40000 [IU] via SUBCUTANEOUS
  Filled 2014-09-21: qty 1

## 2014-09-28 ENCOUNTER — Other Ambulatory Visit: Payer: Self-pay | Admitting: Internal Medicine

## 2014-09-28 ENCOUNTER — Other Ambulatory Visit: Payer: Self-pay | Admitting: Cardiovascular Disease

## 2014-09-29 ENCOUNTER — Telehealth: Payer: Self-pay | Admitting: Oncology

## 2014-09-29 ENCOUNTER — Other Ambulatory Visit (HOSPITAL_BASED_OUTPATIENT_CLINIC_OR_DEPARTMENT_OTHER): Payer: Commercial Managed Care - HMO

## 2014-09-29 ENCOUNTER — Ambulatory Visit (HOSPITAL_BASED_OUTPATIENT_CLINIC_OR_DEPARTMENT_OTHER): Payer: Commercial Managed Care - HMO | Admitting: Oncology

## 2014-09-29 VITALS — BP 144/44 | HR 51 | Temp 98.1°F | Resp 17 | Ht 70.0 in | Wt 183.5 lb

## 2014-09-29 DIAGNOSIS — D696 Thrombocytopenia, unspecified: Secondary | ICD-10-CM

## 2014-09-29 DIAGNOSIS — D631 Anemia in chronic kidney disease: Secondary | ICD-10-CM

## 2014-09-29 DIAGNOSIS — D649 Anemia, unspecified: Secondary | ICD-10-CM

## 2014-09-29 DIAGNOSIS — N289 Disorder of kidney and ureter, unspecified: Secondary | ICD-10-CM

## 2014-09-29 DIAGNOSIS — N183 Chronic kidney disease, stage 3 unspecified: Secondary | ICD-10-CM

## 2014-09-29 DIAGNOSIS — Z23 Encounter for immunization: Secondary | ICD-10-CM | POA: Diagnosis not present

## 2014-09-29 LAB — CBC WITH DIFFERENTIAL/PLATELET
BASO%: 0 % (ref 0.0–2.0)
BASOS ABS: 0 10*3/uL (ref 0.0–0.1)
EOS%: 2 % (ref 0.0–7.0)
Eosinophils Absolute: 0 10*3/uL (ref 0.0–0.5)
HCT: 32.7 % — ABNORMAL LOW (ref 38.4–49.9)
HEMOGLOBIN: 10.3 g/dL — AB (ref 13.0–17.1)
LYMPH#: 0.3 10*3/uL — AB (ref 0.9–3.3)
LYMPH%: 16.2 % (ref 14.0–49.0)
MCH: 29.2 pg (ref 27.2–33.4)
MCHC: 31.5 g/dL — ABNORMAL LOW (ref 32.0–36.0)
MCV: 92.6 fL (ref 79.3–98.0)
MONO#: 0.1 10*3/uL (ref 0.1–0.9)
MONO%: 3.4 % (ref 0.0–14.0)
NEUT#: 1.6 10*3/uL (ref 1.5–6.5)
NEUT%: 78.4 % — ABNORMAL HIGH (ref 39.0–75.0)
NRBC: 0 % (ref 0–0)
Platelets: 22 10*3/uL — ABNORMAL LOW (ref 140–400)
RBC: 3.53 10*6/uL — ABNORMAL LOW (ref 4.20–5.82)
RDW: 17.5 % — AB (ref 11.0–14.6)
WBC: 2 10*3/uL — ABNORMAL LOW (ref 4.0–10.3)

## 2014-09-29 MED ORDER — INFLUENZA VAC SPLIT QUAD 0.5 ML IM SUSY
0.5000 mL | PREFILLED_SYRINGE | Freq: Once | INTRAMUSCULAR | Status: AC
  Administered 2014-09-29: 0.5 mL via INTRAMUSCULAR
  Filled 2014-09-29: qty 0.5

## 2014-09-29 NOTE — Progress Notes (Signed)
  [facility] OFFICE PROGRESS NOTE   Diagnosis: Pancytopenia  INTERVAL HISTORY:   [name] returns as scheduled. He continues weekly Procrit. He has not noted improvement in his energy level since starting Procrit. He bruises easily. No other bleeding. He has noted a skin lesion at the right ear.  Objective:  Vital signs in last 24 hours:  Blood pressure 144/44, pulse 51, temperature 98.1 F (36.7 C), temperature source Oral, resp. rate 17, height _0  (1.778 m), weight 183 lb 8 oz (83.235 kg), SpO2 100 %.    HEENT: A few petechiae at the buccal mucosa Resp: Lungs clear bilaterally Cardio: Regular rate and rhythm, 2/6 systolic murmur GI: The spleen is palpable in the left abdomen with associated tenderness, no apparent ascites Vascular: 1+ edema at the lower leg bilaterally  Skin: Scattered ecchymoses over the trunk and extremities, less than 1 cm raised hemorrhagic lesion at the right ear inferior to the pinae     Lab Results:  Lab Results  Component Value Date   WBC 2.0* 09/29/2014   HGB 10.3* 09/29/2014   HCT 32.7* 09/29/2014   MCV 92.6 09/29/2014   PLT 22* 09/29/2014   NEUTROABS 1.6 09/29/2014     Medications: I have reviewed the patient's current medications.  Assessment/Plan: 1. Chronic pancytopenia/splenomegaly-likely related to a chronic lymphoproliferative disorder,? Splenic lymphoma   Bone marrow biopsy at [facility] November 2012-slightly hypercellular marrow with trilineage hematopoiesis, interstitial Nieman-Pick like histiocytosis. Negative for dysplasia, negative for lymphoma, negative for increased blasts. 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 History of severe aortic stenosis, status post aortic valve replacement surgery at [facility] on 01/23/2011; status post transcatheter aortic valve replacement 05/14/2014  3. History of gout  4. Diabetes  5. Streptococcus bacteremia-TEE negative  for endocarditis  6. Malaise-likely multifactorial 7. Renal insufficiency  8. Paroxysmal atrial fibrillation  9. Colon polyps noted on a virtual colonoscopy 02/10/2013  10. Anemia secondary to renal insufficiency and the chronic lymphoproliferative disorder. Trial of weekly erythropoietin initiated 04/09/2014 with improvement. The anemia progressed. Erythropoietin resumed 07/02/2014. Discontinued 09/29/2014 secondary to patient not experiencing clinical benefit 11. Right ear skin lesion-possibly a basal cell carcinoma, observed for now    Disposition:  [name] appears stable. He has not noted improvement in his energy level with the rise in his hemoglobin over the past few months. He would like to discontinue Procrit therapy. We administered an influenza vaccine today. He has persistent severe thrombocytopenia. He is taking aspirin. He bruises easily. I will contact Dr. Burt Knack to discuss the indication for stopping aspirin therapy.  Mr. Rendell will return for an office and lab visit in one month.  Betsy Coder, MD  09/29/2014  10:56 AM

## 2014-09-29 NOTE — Telephone Encounter (Signed)
Gave adn printed appt sched and avs for pt for OCT °

## 2014-10-01 ENCOUNTER — Telehealth: Payer: Self-pay | Admitting: *Deleted

## 2014-10-01 NOTE — Telephone Encounter (Signed)
Per Dr. Benay Spice; attempted to call pt X3 to stop his aspirin.  No answering machine to leave message on.  Dr. Benay Spice made aware.

## 2014-10-04 ENCOUNTER — Emergency Department (HOSPITAL_COMMUNITY): Payer: Commercial Managed Care - HMO

## 2014-10-04 ENCOUNTER — Inpatient Hospital Stay (HOSPITAL_BASED_OUTPATIENT_CLINIC_OR_DEPARTMENT_OTHER): Payer: Commercial Managed Care - HMO

## 2014-10-04 ENCOUNTER — Encounter (HOSPITAL_COMMUNITY): Payer: Self-pay | Admitting: Emergency Medicine

## 2014-10-04 ENCOUNTER — Observation Stay (HOSPITAL_COMMUNITY)
Admission: EM | Admit: 2014-10-04 | Discharge: 2014-10-05 | Disposition: A | Discharge status: Home / Self Care | Payer: Commercial Managed Care - HMO | Attending: Internal Medicine | Admitting: Internal Medicine

## 2014-10-04 DIAGNOSIS — F32A Depression, unspecified: Secondary | ICD-10-CM

## 2014-10-04 DIAGNOSIS — N183 Chronic kidney disease, stage 3 unspecified: Secondary | ICD-10-CM

## 2014-10-04 DIAGNOSIS — Z66 Do not resuscitate: Secondary | ICD-10-CM | POA: Diagnosis not present

## 2014-10-04 DIAGNOSIS — D696 Thrombocytopenia, unspecified: Secondary | ICD-10-CM

## 2014-10-04 DIAGNOSIS — E780 Pure hypercholesterolemia: Secondary | ICD-10-CM | POA: Insufficient documentation

## 2014-10-04 DIAGNOSIS — Z8673 Personal history of transient ischemic attack (TIA), and cerebral infarction without residual deficits: Secondary | ICD-10-CM | POA: Diagnosis not present

## 2014-10-04 DIAGNOSIS — Z953 Presence of xenogenic heart valve: Secondary | ICD-10-CM | POA: Insufficient documentation

## 2014-10-04 DIAGNOSIS — Z85819 Personal history of malignant neoplasm of unspecified site of lip, oral cavity, and pharynx: Secondary | ICD-10-CM | POA: Insufficient documentation

## 2014-10-04 DIAGNOSIS — L03116 Cellulitis of left lower limb: Secondary | ICD-10-CM

## 2014-10-04 DIAGNOSIS — R001 Bradycardia, unspecified: Secondary | ICD-10-CM

## 2014-10-04 DIAGNOSIS — E039 Hypothyroidism, unspecified: Secondary | ICD-10-CM | POA: Diagnosis not present

## 2014-10-04 DIAGNOSIS — I480 Paroxysmal atrial fibrillation: Secondary | ICD-10-CM | POA: Diagnosis not present

## 2014-10-04 DIAGNOSIS — F329 Major depressive disorder, single episode, unspecified: Secondary | ICD-10-CM | POA: Insufficient documentation

## 2014-10-04 DIAGNOSIS — Z7982 Long term (current) use of aspirin: Secondary | ICD-10-CM | POA: Diagnosis not present

## 2014-10-04 DIAGNOSIS — D61818 Other pancytopenia: Secondary | ICD-10-CM

## 2014-10-04 DIAGNOSIS — E1122 Type 2 diabetes mellitus with diabetic chronic kidney disease: Secondary | ICD-10-CM | POA: Diagnosis not present

## 2014-10-04 DIAGNOSIS — M7541 Impingement syndrome of right shoulder: Secondary | ICD-10-CM

## 2014-10-04 DIAGNOSIS — I351 Nonrheumatic aortic (valve) insufficiency: Secondary | ICD-10-CM

## 2014-10-04 DIAGNOSIS — I700 Atherosclerosis of aorta: Secondary | ICD-10-CM

## 2014-10-04 DIAGNOSIS — Z79899 Other long term (current) drug therapy: Secondary | ICD-10-CM | POA: Diagnosis not present

## 2014-10-04 DIAGNOSIS — R161 Splenomegaly, not elsewhere classified: Secondary | ICD-10-CM

## 2014-10-04 DIAGNOSIS — G252 Other specified forms of tremor: Secondary | ICD-10-CM

## 2014-10-04 DIAGNOSIS — M109 Gout, unspecified: Secondary | ICD-10-CM | POA: Diagnosis not present

## 2014-10-04 DIAGNOSIS — I5032 Chronic diastolic (congestive) heart failure: Secondary | ICD-10-CM | POA: Diagnosis not present

## 2014-10-04 DIAGNOSIS — R7881 Bacteremia: Secondary | ICD-10-CM

## 2014-10-04 DIAGNOSIS — I272 Other secondary pulmonary hypertension: Secondary | ICD-10-CM | POA: Insufficient documentation

## 2014-10-04 DIAGNOSIS — Z87891 Personal history of nicotine dependence: Secondary | ICD-10-CM | POA: Diagnosis not present

## 2014-10-04 DIAGNOSIS — Z952 Presence of prosthetic heart valve: Secondary | ICD-10-CM

## 2014-10-04 DIAGNOSIS — G250 Essential tremor: Secondary | ICD-10-CM

## 2014-10-04 DIAGNOSIS — I129 Hypertensive chronic kidney disease with stage 1 through stage 4 chronic kidney disease, or unspecified chronic kidney disease: Secondary | ICD-10-CM | POA: Insufficient documentation

## 2014-10-04 DIAGNOSIS — I2510 Atherosclerotic heart disease of native coronary artery without angina pectoris: Secondary | ICD-10-CM | POA: Diagnosis not present

## 2014-10-04 DIAGNOSIS — R0789 Other chest pain: Secondary | ICD-10-CM | POA: Diagnosis not present

## 2014-10-04 DIAGNOSIS — E43 Unspecified severe protein-calorie malnutrition: Secondary | ICD-10-CM | POA: Diagnosis not present

## 2014-10-04 DIAGNOSIS — I509 Heart failure, unspecified: Secondary | ICD-10-CM

## 2014-10-04 DIAGNOSIS — F419 Anxiety disorder, unspecified: Secondary | ICD-10-CM | POA: Diagnosis not present

## 2014-10-04 DIAGNOSIS — I350 Nonrheumatic aortic (valve) stenosis: Secondary | ICD-10-CM | POA: Diagnosis not present

## 2014-10-04 DIAGNOSIS — I359 Nonrheumatic aortic valve disorder, unspecified: Secondary | ICD-10-CM

## 2014-10-04 DIAGNOSIS — R531 Weakness: Secondary | ICD-10-CM

## 2014-10-04 DIAGNOSIS — N289 Disorder of kidney and ureter, unspecified: Secondary | ICD-10-CM

## 2014-10-04 DIAGNOSIS — I10 Essential (primary) hypertension: Secondary | ICD-10-CM

## 2014-10-04 DIAGNOSIS — G2581 Restless legs syndrome: Secondary | ICD-10-CM

## 2014-10-04 DIAGNOSIS — K219 Gastro-esophageal reflux disease without esophagitis: Secondary | ICD-10-CM | POA: Diagnosis not present

## 2014-10-04 LAB — APTT: aPTT: 34 seconds (ref 24–37)

## 2014-10-04 LAB — PROTIME-INR
INR: 1.32 (ref 0.00–1.49)
Prothrombin Time: 16.5 seconds — ABNORMAL HIGH (ref 11.6–15.2)

## 2014-10-04 LAB — CBC
HEMATOCRIT: 28.4 % — AB (ref 39.0–52.0)
HEMOGLOBIN: 9.1 g/dL — AB (ref 13.0–17.0)
MCH: 29.4 pg (ref 26.0–34.0)
MCHC: 32 g/dL (ref 30.0–36.0)
MCV: 91.6 fL (ref 78.0–100.0)
Platelets: 30 10*3/uL — ABNORMAL LOW (ref 150–400)
RBC: 3.1 MIL/uL — AB (ref 4.22–5.81)
RDW: 17 % — ABNORMAL HIGH (ref 11.5–15.5)
WBC: 1.7 10*3/uL — ABNORMAL LOW (ref 4.0–10.5)

## 2014-10-04 LAB — LIPASE, BLOOD: LIPASE: 21 U/L — AB (ref 22–51)

## 2014-10-04 LAB — BASIC METABOLIC PANEL
ANION GAP: 10 (ref 5–15)
BUN: 53 mg/dL — ABNORMAL HIGH (ref 6–20)
CALCIUM: 8.7 mg/dL — AB (ref 8.9–10.3)
CO2: 20 mmol/L — AB (ref 22–32)
Chloride: 112 mmol/L — ABNORMAL HIGH (ref 101–111)
Creatinine, Ser: 1.84 mg/dL — ABNORMAL HIGH (ref 0.61–1.24)
GFR calc non Af Amer: 31 mL/min — ABNORMAL LOW (ref >60)
GFR, EST AFRICAN AMERICAN: 36 mL/min — AB (ref >60)
GLUCOSE: 114 mg/dL — AB (ref 65–99)
POTASSIUM: 3.6 mmol/L (ref 3.5–5.1)
Sodium: 142 mmol/L (ref 135–145)

## 2014-10-04 LAB — TROPONIN I
TROPONIN I: 0.03 ng/mL (ref <0.031)
Troponin I: <0.03 ng/mL (ref <0.031)
Troponin I: <0.03 ng/mL (ref <0.031)

## 2014-10-04 LAB — TSH: TSH: 3.975 u[IU]/mL (ref 0.350–4.500)

## 2014-10-04 LAB — MAGNESIUM: Magnesium: 1.9 mg/dL (ref 1.7–2.4)

## 2014-10-04 LAB — GLUCOSE, CAPILLARY
GLUCOSE-CAPILLARY: 103 mg/dL — AB (ref 65–99)
Glucose-Capillary: 149 mg/dL — ABNORMAL HIGH (ref 65–99)

## 2014-10-04 MED ORDER — FUROSEMIDE 40 MG PO TABS
40.0000 mg | ORAL_TABLET | Freq: Two times a day (BID) | ORAL | Status: DC
  Administered 2014-10-04 – 2014-10-05 (×3): 40 mg via ORAL
  Filled 2014-10-04 (×3): qty 1

## 2014-10-04 MED ORDER — ASPIRIN 325 MG PO TABS
325.0000 mg | ORAL_TABLET | Freq: Once | ORAL | Status: AC
  Administered 2014-10-04: 325 mg via ORAL
  Filled 2014-10-04: qty 1

## 2014-10-04 MED ORDER — ALLOPURINOL 100 MG PO TABS
100.0000 mg | ORAL_TABLET | Freq: Every day | ORAL | Status: DC
  Administered 2014-10-04 – 2014-10-05 (×2): 100 mg via ORAL
  Filled 2014-10-04 (×2): qty 1

## 2014-10-04 MED ORDER — PROPRANOLOL HCL 10 MG PO TABS
10.0000 mg | ORAL_TABLET | Freq: Two times a day (BID) | ORAL | Status: DC
  Administered 2014-10-04 – 2014-10-05 (×3): 10 mg via ORAL
  Filled 2014-10-04 (×4): qty 1

## 2014-10-04 MED ORDER — SODIUM CHLORIDE 0.9 % IV BOLUS (SEPSIS)
1000.0000 mL | Freq: Once | INTRAVENOUS | Status: AC
  Administered 2014-10-04: 1000 mL via INTRAVENOUS

## 2014-10-04 MED ORDER — SODIUM CHLORIDE 0.9 % IJ SOLN
3.0000 mL | Freq: Two times a day (BID) | INTRAMUSCULAR | Status: DC
Start: 2014-10-04 — End: 2014-10-05
  Administered 2014-10-04: 3 mL via INTRAVENOUS

## 2014-10-04 MED ORDER — ASPIRIN 325 MG PO TABS
325.0000 mg | ORAL_TABLET | Freq: Every day | ORAL | Status: DC
Start: 2014-10-04 — End: 2014-10-04
  Administered 2014-10-04: 325 mg via ORAL
  Filled 2014-10-04: qty 1

## 2014-10-04 MED ORDER — SERTRALINE HCL 50 MG PO TABS
50.0000 mg | ORAL_TABLET | Freq: Every day | ORAL | Status: DC
  Administered 2014-10-04 – 2014-10-05 (×2): 50 mg via ORAL
  Filled 2014-10-04 (×2): qty 1

## 2014-10-04 MED ORDER — ADULT MULTIVITAMIN W/MINERALS CH
1.0000 | ORAL_TABLET | Freq: Every day | ORAL | Status: DC
  Administered 2014-10-04 – 2014-10-05 (×2): 1 via ORAL
  Filled 2014-10-04 (×2): qty 1

## 2014-10-04 MED ORDER — HYDROXYZINE HCL 50 MG/ML IM SOLN
25.0000 mg | Freq: Four times a day (QID) | INTRAMUSCULAR | Status: DC | PRN
  Filled 2014-10-04: qty 0.5

## 2014-10-04 MED ORDER — ACETAMINOPHEN 650 MG RE SUPP: 650.0000 mg | Freq: Four times a day (QID) | RECTAL | Status: DC | PRN

## 2014-10-04 MED ORDER — ONE-DAILY MULTI VITAMINS PO TABS
1.0000 | ORAL_TABLET | Freq: Every day | ORAL | Status: DC
Start: 2014-10-04 — End: 2014-10-04

## 2014-10-04 MED ORDER — OMEGA-3-ACID ETHYL ESTERS 1 G PO CAPS
1.0000 g | ORAL_CAPSULE | Freq: Every day | ORAL | Status: DC
  Administered 2014-10-04 – 2014-10-05 (×2): 1 g via ORAL
  Filled 2014-10-04 (×2): qty 1

## 2014-10-04 MED ORDER — PROCAINAMIDE HCL 100 MG/ML IJ SOLN
1000.0000 mg | Freq: Once | INTRAVENOUS | Status: AC
  Administered 2014-10-04: 1000 mg via INTRAVENOUS
  Filled 2014-10-04: qty 10

## 2014-10-04 MED ORDER — VITAMIN D 1000 UNITS PO TABS
1000.0000 [IU] | ORAL_TABLET | Freq: Every day | ORAL | Status: DC
  Administered 2014-10-04 – 2014-10-05 (×2): 1000 [IU] via ORAL
  Filled 2014-10-04 (×2): qty 1

## 2014-10-04 MED ORDER — POTASSIUM CHLORIDE ER 10 MEQ PO TBCR
10.0000 meq | EXTENDED_RELEASE_TABLET | Freq: Every day | ORAL | Status: DC
Start: 2014-10-04 — End: 2014-10-05
  Administered 2014-10-04 – 2014-10-05 (×2): 10 meq via ORAL
  Filled 2014-10-04 (×4): qty 1

## 2014-10-04 MED ORDER — CLONAZEPAM 0.5 MG PO TABS: 0.5000 mg | ORAL_TABLET | Freq: Every day | ORAL | Status: DC | PRN

## 2014-10-04 MED ORDER — LEVOTHYROXINE SODIUM 25 MCG PO TABS
25.0000 ug | ORAL_TABLET | Freq: Every day | ORAL | Status: DC
  Administered 2014-10-05: 25 ug via ORAL
  Filled 2014-10-04: qty 1

## 2014-10-04 MED ORDER — ENSURE ENLIVE PO LIQD: 237.0000 mL | Freq: Two times a day (BID) | ORAL | Status: DC

## 2014-10-04 MED ORDER — VITAMIN D 1000 UNITS PO CAPS: 1000.0000 [IU] | ORAL_CAPSULE | Freq: Every day | ORAL | Status: DC

## 2014-10-04 MED ORDER — AMIODARONE HCL 200 MG PO TABS
200.0000 mg | ORAL_TABLET | Freq: Every day | ORAL | Status: DC
  Administered 2014-10-04 – 2014-10-05 (×2): 200 mg via ORAL
  Filled 2014-10-04 (×2): qty 1

## 2014-10-04 MED ORDER — PANTOPRAZOLE SODIUM 40 MG PO TBEC
40.0000 mg | DELAYED_RELEASE_TABLET | Freq: Every day | ORAL | Status: DC
  Administered 2014-10-04 – 2014-10-05 (×2): 40 mg via ORAL
  Filled 2014-10-04 (×2): qty 1

## 2014-10-04 MED ORDER — VITAMIN C 500 MG PO TABS
500.0000 mg | ORAL_TABLET | Freq: Every day | ORAL | Status: DC
Start: 2014-10-04 — End: 2014-10-05
  Administered 2014-10-04 – 2014-10-05 (×2): 500 mg via ORAL
  Filled 2014-10-04 (×2): qty 1

## 2014-10-04 MED ORDER — FLAX SEED OIL 1000 MG PO CAPS: 1.0000 | ORAL_CAPSULE | Freq: Every day | ORAL | Status: DC

## 2014-10-04 MED ORDER — ACETAMINOPHEN 325 MG PO TABS: 650.0000 mg | ORAL_TABLET | Freq: Four times a day (QID) | ORAL | Status: DC | PRN

## 2014-10-04 MED ORDER — COLCHICINE 0.6 MG PO TABS: 0.6000 mg | ORAL_TABLET | Freq: Every day | ORAL | Status: DC | PRN

## 2014-10-04 MED ORDER — OMEGA-3 FATTY ACIDS 1000 MG PO CAPS: 1.0000 g | ORAL_CAPSULE | Freq: Every day | ORAL | Status: DC

## 2014-10-04 NOTE — Progress Notes (Signed)
PT Cancellation Note  Patient Details Name: TADD HOLTMEYER MRN: [id number] DOB: 05-May-1922   Cancelled Treatment:    Reason Eval/Treat Not Completed: Patient at procedure or test/unavailable. Will check back as schedule permits.   SMITH,KAREN LUBECK 10/04/2014, 10:10 AM

## 2014-10-04 NOTE — ED Notes (Signed)
Attempted report 

## 2014-10-04 NOTE — Consult Note (Signed)
Reason for Consult: chest pain secondary to PAF- now a fib rate controlled.   Referring Physician:  Ivor Costa  PCP:  [name], MD  Primary Cardiologist:Dr. Kendrick Fries is an 81 y.o. male.    Chief Complaint: admitted early AM for chest pain    HPI: 81 year old male with hx of TAVR via a right transfemoral approach using a 23 mm Sapien 3 valve on 05/14/2014.  Larena Glassman he had AVR with a 33m Mosaic valve 01/2011  (on pre-AVR cath no obs dz.) but developed severe prosthetic valve dysfunction leading to the TAVR 05/2014.  He also has hx of PAF but in May was in [facility]   Echo in June 2016 with EF 65-70% G2DD.   He has hx of  chronic pancytopenia/splenomegaly (followed by Dr. SBenay Spice, possible chronic lymphoproliferative disorder or splenic lymphoma,  hypertension, diet-controlled diabetes, GERD, hypothyroidism, gout, depression.   Today pt admitted for 1 day hx of chest pain..[name] KitchenAlso with irregular HR.  He also has mild SOB no nausea no vomiting, he had some diaphoresis.  At first he stated his HR was fast then with skipping.  Yesterday AM his HR was in the 40s, he runs 45-55, but rate picked up in the afternoon.  BP on arrival was found to be low at 90/53- he rec'd 1 L fluid with improvement.  He is in A fib on chronic amiodarone.  He believes this is his first episode of PAF in 2 years.  He was given 1 gm of pronestyl in ER with already prolonged Qtc.  His HR has been controlled since admit in the 70s despite having low systolic BP.  BP now labile, his chest pressure comes and goes lasting seconds when it is present.   EKG:  Vent. rate 68 BPM PR interval * ms QRS duration 122 ms QT/QTc 475/505 ms Atrial fibrillation LVH with IVCD and secondary repol abnrm Prolonged QT interval afib new since prior tracing    He had pancytopenia with WBC 1.7 which was 2.0, hemoglobin 9.1 which was 10.3, platelet 30 which was 22 on 09/29/14.  Troponins have been negative.        Echo 06/2014: Study Conclusions  - Left ventricle: The cavity size was normal. There was mild concentric hypertrophy. Systolic function was vigorous. The estimated ejection fraction was in the range of 65% to 70%. Wall motion was normal; there were no regional wall motion abnormalities. Features are consistent with a pseudonormal left ventricular filling pattern, with concomitant abnormal relaxation and increased filling pressure (grade 2 diastolic dysfunction). Doppler parameters are consistent with elevated ventricular end-diastolic filling pressure. - Aortic valve: Trileaflet; normal thickness leaflets. - Aortic root: The aortic root was normal in size. - Mitral valve: Calcified annulus. Mildly thickened leaflets . There was mild regurgitation. - Left atrium: The atrium was moderately dilated. - Right ventricle: The cavity size was normal. Wall thickness was normal. Systolic function was normal. - Right atrium: The atrium was normal in size. - Tricuspid valve: There was mild regurgitation. - Pulmonary arteries: Systolic pressure was within the normal range. PA peak pressure: 32 mm Hg (S). - Inferior vena cava: The vessel was normal in size. - Pericardium, extracardiac: There was no pericardial effusion.  Impressions:  - There is no significant change when compared to the prior study from 5/9 2/106.  Filling pressures remain elevated. Past Medical History  Diagnosis Date  . Stroke   . Atrial  fibrillation     a. amiodarone therapy; not felt to be a candidate for anticoagulation - bruises easily.  . Bradycardia   . Aortic stenosis     a. Previously severe -> s/p minimally invasive tissue AVR with Dr. Evelina Dun at [facility] 01/2011 (pre-AVR cath with no obs CAD);  b. 05/2014 s/p TAVR (23 mm Edwards Sapien 3).  . Hypertension   . Anemia   . Action tremor   . Thrombocytopenia     Dr. Benay Spice  . Colon polyp   . Anxiety   . Splenomegaly   . Gout    . GERD (gastroesophageal reflux disease)   . Hypothyroidism   . Leukopenia     Chronic pancytopenia  . Degenerative disc disease   . Joint effusion, knee     left knee  . Synovial cyst of popliteal space   . Cellulitis of left leg 10/11-16/2012  . CKD (chronic kidney disease), stage III   . Chronic diastolic CHF (congestive heart failure)     a. 12/15/2012 TEE: EF 60-65%, no veg.  . Bacteremia     a. 12/2012 - S bovis;  b. 12/2012 TEE w/o veg;  c. Seeing ID->Rocephin therapy extended to 01/25/2013 via PICC for possible endocarditis (No veg on TEE).  . High cholesterol   . Diabetes mellitus     "borderline" (01/05/2013)  . History of blood transfusion 01/2011; 11/2012  . H/O hiatal hernia   . Throat cancer     s/p lasered  . Diverticulosis   . Severe aortic insufficiency   . S/P TAVR (transcatheter aortic valve replacement)     a. 05/14/2014 TAVR: 23 mm Edwards Sapien 3 transcatheter heart valve placed valve-in-valve for prosthetic valve dysfunction via open right transfemoral approach    Past Surgical History  Procedure Laterality Date  . Cholecystectomy    . Joint effusion      left knee  . Aortic valve replacement  01/23/2011    via minimally invasive approach per Dr Evelina Dun, [facility]  . Tee without cardioversion N/A 12/15/2012    Procedure: TRANSESOPHAGEAL ECHOCARDIOGRAM (TEE);  Surgeon: [name], MD;  Location[facility] ENDOSCOPY;  Service: Cardiovascular;  Laterality: N/A;  . Cardiac valve replacement    . Inguinal hernia repair Right   . Excisional hemorrhoidectomy    . Cardiac catheterization    . Surgery scrotal / testicular      "removed one" (01/05/2013)  . 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 Tee without cardioversion N/A 03/26/2014    Procedure: TRANSESOPHAGEAL ECHOCARDIOGRAM (TEE);  Surgeon: Josue Hector, MD;   Location: [facility] ENDOSCOPY;  Service: Cardiovascular;  Laterality: N/A;  . Transcatheter aortic valve replacement, transfemoral Right 05/14/2014    Procedure: TRANSCATHETER AORTIC VALVE REPLACEMENT, TRANSFEMORAL;  Surgeon: [name], MD;  Location: [facility];  Service: Open Heart Surgery;  Laterality: Right;  . Tee without cardioversion N/A 05/14/2014    Procedure: TRANSESOPHAGEAL ECHOCARDIOGRAM (TEE);  Surgeon: [name], MD;  Location: [facility];  Service: Open Heart Surgery;  Laterality: N/A;    Family History  Problem Relation Age of Onset  . Colon cancer    . Stroke    . Cancer Other     colon  . Stroke Other   . Lung cancer Mother   . Heart disease Father   . Cancer Brother  Social History:  reports that he quit smoking about 52 years ago. His smoking use included Cigarettes. He has a 18 pack-year smoking history. He has never used smokeless tobacco. He reports that he does not drink alcohol or use illicit drugs.  Allergies:  Allergies  Allergen Reactions  . Penicillins Other (See Comments)    Does not remember reaction (~year 1950)  . Neomycin-Bacitracin Zn-Polymyx Other (See Comments)    ? Reaction (thinks he remembers redness)     OUTPATIENT MEDICATIONS:  No current facility-administered medications on file prior to encounter.   Current Outpatient Prescriptions on File Prior to Encounter  Medication Sig Dispense Refill  . allopurinol (ZYLOPRIM) 100 MG tablet Take 1 tablet (100 mg total) by mouth daily. 90 tablet 1  . amiodarone (PACERONE) 200 MG tablet TAKE 1 TABLET EVERY DAY 90 tablet 0  . amLODipine (NORVASC) 5 MG tablet TAKE 1 TABLET EVERY DAY 90 tablet 3  . aspirin 325 MG tablet Take 325 mg by mouth daily.      . Cholecalciferol (VITAMIN D) 1000 UNITS capsule Take 1 capsule (1,000 Units total) by mouth daily. 90 capsule 3  . clonazePAM (KLONOPIN) 0.5 MG tablet 1/2 as needed if you wake up early (Patient taking differently: Take 0.5 mg by mouth daily as needed (wake up  early). ) 30 tablet 1  . colchicine 0.6 MG tablet Take 0.6 mg by mouth daily as needed (gout flares). Take one tablet by mouth daily during an acute episode of gout.    . fish oil-omega-3 fatty acids 1000 MG capsule Take 1 g by mouth daily.      . Flaxseed, Linseed, (FLAX SEED OIL) 1000 MG CAPS Take 1 capsule by mouth daily.     . furosemide (LASIX) 80 MG tablet TAKE 1 TABLET TWICE DAILY 180 tablet 3  . levothyroxine (SYNTHROID, LEVOTHROID) 25 MCG tablet TAKE 1 TABLET EVERY DAY 90 tablet 3  . metolazone (ZAROXOLYN) 2.5 MG tablet Take 2.5 mg by mouth daily as needed (sleep). DAILY AS NEEDED FOR EDEMA    . Multiple Vitamin (MULTIVITAMIN) tablet Take 1 tablet by mouth daily.      [name] Kitchen omeprazole (PRILOSEC) 20 MG capsule Take 1 capsule (20 mg total) by mouth daily. 90 capsule 1  . potassium chloride (K-DUR) 10 MEQ tablet Take 1 tablet (10 mEq total) by mouth daily. 90 tablet 1  . potassium chloride (K-DUR,KLOR-CON) 10 MEQ tablet TAKE 1 TABLET EVERY DAY 90 tablet 3  . propranolol (INDERAL) 10 MG tablet Take 1 tablet (10 mg total) by mouth 2 (two) times daily. 180 tablet 1  . sertraline (ZOLOFT) 50 MG tablet TAKE 1 TABLET (50 MG TOTAL) BY MOUTH DAILY. 90 tablet 0  . vitamin C (ASCORBIC ACID) 500 MG tablet Take 1 tablet (500 mg total) by mouth daily. 90 tablet 3     Results for orders placed or performed during the hospital encounter of 10/04/14 (from the past 48 hour(s))  Basic metabolic panel     Status: Abnormal   Collection Time: 10/04/14  1:49 AM  Result Value Ref Range   Sodium 142 135 - 145 mmol/L   Potassium 3.6 3.5 - 5.1 mmol/L   Chloride 112 (H) 101 - 111 mmol/L   CO2 20 (L) 22 - 32 mmol/L   Glucose, Bld 114 (H) 65 - 99 mg/dL   BUN 53 (H) 6 - 20 mg/dL   Creatinine, Ser 1.84 (H) 0.61 - 1.24 mg/dL   Calcium 8.7 (L) 8.9 - 10.3  mg/dL   GFR calc non Af Amer 31 (L) >60 mL/min   GFR calc Af Amer 36 (L) >60 mL/min    Comment: (NOTE) The eGFR has been calculated using the CKD EPI  equation. This calculation has not been validated in all clinical situations. eGFR's persistently <60 mL/min signify possible Chronic Kidney Disease.    Anion gap 10 5 - 15  CBC     Status: Abnormal   Collection Time: 10/04/14  1:49 AM  Result Value Ref Range   WBC 1.7 (L) 4.0 - 10.5 K/uL    Comment: REPEATED TO VERIFY WHITE COUNT CONFIRMED ON SMEAR    RBC 3.10 (L) 4.22 - 5.81 MIL/uL   Hemoglobin 9.1 (L) 13.0 - 17.0 g/dL   HCT 28.4 (L) 39.0 - 52.0 %   MCV 91.6 78.0 - 100.0 fL   MCH 29.4 26.0 - 34.0 pg   MCHC 32.0 30.0 - 36.0 g/dL   RDW 17.0 (H) 11.5 - 15.5 %   Platelets 30 (L) 150 - 400 K/uL    Comment: SPECIMEN CHECKED FOR CLOTS REPEATED TO VERIFY PLATELET COUNT CONFIRMED BY SMEAR   Troponin I     Status: None   Collection Time: 10/04/14  1:49 AM  Result Value Ref Range   Troponin I 0.03 <0.031 ng/mL    Comment:        NO INDICATION OF MYOCARDIAL INJURY.   Lipase, blood     Status: Abnormal   Collection Time: 10/04/14  6:18 AM  Result Value Ref Range   Lipase 21 (L) 22 - 51 U/L  Troponin I (q 6hr x 3)     Status: None   Collection Time: 10/04/14  6:18 AM  Result Value Ref Range   Troponin I <0.03 <0.031 ng/mL    Comment:        NO INDICATION OF MYOCARDIAL INJURY.   Protime-INR     Status: Abnormal   Collection Time: 10/04/14  6:18 AM  Result Value Ref Range   Prothrombin Time 16.5 (H) 11.6 - 15.2 seconds   INR 1.32 0.00 - 1.49  APTT     Status: None   Collection Time: 10/04/14  6:18 AM  Result Value Ref Range   aPTT 34 24 - 37 seconds  Glucose, capillary     Status: Abnormal   Collection Time: 10/04/14 11:30 AM  Result Value Ref Range   Glucose-Capillary 149 (H) 65 - 99 mg/dL   Dg Chest 2 View  10/04/2014   CLINICAL DATA:  Chest pain and atrial fibrillation  EXAM: CHEST  2 VIEW  COMPARISON:  05/15/2014  FINDINGS: Chronic cardiomegaly and aortic tortuosity, status post transcatheter aortic valve replacement on top of previous aortic valve replacement.  Low  lung volumes. There is no edema, consolidation, effusion, or pneumothorax. No acute osseous finding.  Cholecystectomy.  Known splenomegaly in this patient with cirrhosis.  IMPRESSION: No acute finding.   Electronically Signed   By: Monte Fantasia M.D.   On: 10/04/2014 02:27    ROS: General:no colds or fevers, no significant weight changes Skin:no rashes or ulcers, + bruising on arms chronic HEENT:no blurred vision, no congestion CV:see HPI- chronic lower ext edema PUL:see HPI GI:no diarrhea constipation or melena, no indigestion- stools are dark due to Iron GU:no hematuria, no dysuria MS:no joint pain, no claudication Neuro:no syncope, no lightheadedness Endo:borderline diabetes, + thyroid disease--TSH in process   Blood pressure 95/41, pulse 76, temperature 97.9 F (36.6 C), temperature source Oral, resp. rate 18,  weight 176 lb 9.6 oz (80.105 kg), SpO2 100 %.  Wt Readings from Last 3 Encounters:  10/04/14 176 lb 9.6 oz (80.105 kg)  09/29/14 183 lb 8 oz (83.235 kg)  08/24/14 178 lb 12.8 oz (81.103 kg)    PE: General:Pleasant affect, NAD Skin:Warm and dry, brisk capillary refill, old bruising HEENT:normocephalic, sclera clear, mucus membranes moist Neck:supple, no JVD, no bruits  Heart: fairly regular with 2.6 systolic murmur,  No gallup, rub or click Lungs:clear without rales, rhonchi, or wheezes SVX:BLTJ, non tender, + BS, do not palpate liver spleen or masses Ext:1+ lower ext edema,  2+ radial pulses Neuro:alert and oriented X 3, MAE, follows commands, + facial symmetry    Assessment/Plan 1. A fib rate controlled- has been in since yesterday.  rec'd 1 gm IV procainamide in ER he is on amiodarone 200 daily and inderal 10 mg BID. Would continue same.   2.CHA2DS2-VASc Score is 4- has not been on anticoagulation with plts of 22 with chronic      pancytopenia/splenomegaly (followed by Dr. Benay Spice), possible chronic lymphoproliferative disorder or      splenic lymphoma--Dr. Burt Knack  to address as a fib appears to be symptomatic.  But cannot anticoagulate, no DCCV. Rate is controlled.    3.  Prolonged Qtc at 510 ms in May 2015 was 470 ms. Check mag  4.  Chronic diastolic HF with EF 03-00% and G2DD with lower ext edema which pt stated was chronic  5. Aortic stenosis with hx of hx of TAVR via a right transfemoral approach using a 23 mm Sapien 3 valve on     05/14/2014.  Larena Glassman he had AVR with a 56m Mosaic valve 01/2011  (on pre-AVR cath no obs dz.) but          developed severe prosthetic valve dysfunction leading to the TAVR 05/2014.  6. Chronic pancytopenia/splenomegaly: Patient is a followed up by Dr. SBenay Spice Last seen was 09/29/14. Per his note, this is likely related to a chronic lymphoproliferative disorder or splenic lymphoma. His platelet is stable 22-->30, hemoglobin dropped to slipped slightly from 10.3-->9.1, WBC stable 1.7-->2.0.  Per IM  7. Hypotensive agree with holding norvasc for now - lasix on half strength due to hypotension.   8. CKD 3 with cr 1.84 today improved from June   * Dr. SBenay Spicewas thinking to stop ASA-- will stop now.     ICounty CenterPractitioner Certified CVilla ParkPager [phone] after 5pm or weekends call [phone] 10/04/2014, 11:59 AM  Patient seen, examined. Available data reviewed. Agree with findings, assessment, and plan as outlined by LCecilie Kicks NP. The patient is well-known to me. He has a complex history with chronic pancytopenia, chronic diastolic CHF, CKD III, and aortic valve disease s/p Valve-in-Valve TAVR in May 2016. Exam is unchanged with an elderly male in NAD, diffuse ecchymoses, heart RRR with 2/6 systolic murmur at the RUSB, no diastolic murmur, and diffuse 1+ leg edema. Lung fields are clear. Abdomen is protuberant (unchanged from baseline). Lab and radiographic data reviewed. Telemetry reviewed. He is in atrial fibrillation but heart rate is well-controlled. He has known hx of PAF  and is on amiodarone. Would continue his current medical therapy. He is not a candidate for cardioversion because of inability to anticoagulate. I have discussed the pros and cons of anticoagulation/antiplatelet Rx with Dr SBenay Spiceand we agree that ASA should be stopped (plt count 22k recently). Agree hold norvasc. Will review 2D echo when  reported - completed this am. Otherwise he appears stable. I would not give any more fluids as he is at high-risk of developing CHF exacerbation/volume overload.  [name], M.D. 10/04/2014 1:33 PM

## 2014-10-04 NOTE — ED Provider Notes (Signed)
CSN: [id number]     Arrival date & time 10/04/14  0012 History  By signing my name below, I, Evelene Croon, attest that this documentation has been prepared under the direction and in the presence of Jola Schmidt, MD . Electronically Signed: Evelene Croon, Scribe. 10/04/2014. 2:24 AM.    Chief Complaint  Patient presents with  . Weakness    The history is provided by the patient. No language interpreter was used.     HPI Comments:  [name] is a 81 y.o. male with a history of HTN, AFIB and CVA, who presents to the Emergency Department via EMS complaining of intermittent palpitations since ~0900 yesterday am; symptoms returned ~2100 last night. Pt states he feels as if his heart is  "running real fast and skipping". His HR was in the 80s when symptom returned,normal HR is usually between 45-55. He reports associated central chest discomfort since last night and hypotension; states his BP was 90/53 PTA and his BP yesterday AM was 153/54 and usually between 809-983 systolic. He denies nausea, vomiting, and h/o MI.  He takes ASA daily; no other blood thinners. Pt has been eating and drinking slightly less than he normally would. No alleviating factors noted.  Past Medical History  Diagnosis Date  . Stroke   . Atrial fibrillation     a. amiodarone therapy; not felt to be a candidate for anticoagulation - bruises easily.  . Bradycardia   . Aortic stenosis     a. Previously severe -> s/p minimally invasive tissue AVR with Dr. Evelina Dun at [facility] 01/2011 (pre-AVR cath with no obs CAD);  b. 05/2014 s/p TAVR (23 mm Edwards Sapien 3).  . Hypertension   . Anemia   . Action tremor   . Thrombocytopenia     Dr. Benay Spice  . Colon polyp   . Anxiety   . Splenomegaly   . Gout   . GERD (gastroesophageal reflux disease)   . Hypothyroidism   . Leukopenia     Chronic pancytopenia  . Degenerative disc disease   . Joint effusion, knee     left knee  . Synovial cyst of popliteal space   . Cellulitis of  left leg 10/11-16/2012  . CKD (chronic kidney disease), stage III   . Chronic diastolic CHF (congestive heart failure)     a. 12/15/2012 TEE: EF 60-65%, no veg.  . Bacteremia     a. 12/2012 - S bovis;  b. 12/2012 TEE w/o veg;  c. Seeing ID->Rocephin therapy extended to 01/25/2013 via PICC for possible endocarditis (No veg on TEE).  . High cholesterol   . Diabetes mellitus     "borderline" (01/05/2013)  . History of blood transfusion 01/2011; 11/2012  . H/O hiatal hernia   . Throat cancer     s/p lasered  . Diverticulosis   . Severe aortic insufficiency   . S/P TAVR (transcatheter aortic valve replacement)     a. 05/14/2014 TAVR: 23 mm Edwards Sapien 3 transcatheter heart valve placed valve-in-valve for prosthetic valve dysfunction via open right transfemoral approach   Past Surgical History  Procedure Laterality Date  . Cholecystectomy    . Joint effusion      left knee  . Aortic valve replacement  01/23/2011    via minimally invasive approach per Dr Evelina Dun, [facility]  . Tee without cardioversion N/A 12/15/2012    Procedure: TRANSESOPHAGEAL ECHOCARDIOGRAM (TEE);  Surgeon: [name], MD;  Location: [place];  Service: Cardiovascular;  Laterality: N/A;  .  Cardiac valve replacement    . Inguinal hernia repair Right   . Excisional hemorrhoidectomy    . Cardiac catheterization    . Surgery scrotal / testicular      "removed one" (01/05/2013)  . 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 Tee without cardioversion N/A 03/26/2014    Procedure: TRANSESOPHAGEAL ECHOCARDIOGRAM (TEE);  Surgeon: Josue Hector, MD;  Location: [facility] ENDOSCOPY;  Service: Cardiovascular;  Laterality: N/A;  . Transcatheter aortic valve replacement, transfemoral Right 05/14/2014    Procedure: TRANSCATHETER AORTIC VALVE REPLACEMENT, TRANSFEMORAL;  Surgeon: [name], MD;   Location: [place];  Service: Open Heart Surgery;  Laterality: Right;  . Tee without cardioversion N/A 05/14/2014    Procedure: TRANSESOPHAGEAL ECHOCARDIOGRAM (TEE);  Surgeon: [name], MD;  Location: [place];  Service: Open Heart Surgery;  Laterality: N/A;   Family History  Problem Relation Age of Onset  . Colon cancer    . Stroke    . Cancer Other     colon  . Stroke Other   . Lung cancer Mother   . Heart disease Father   . Cancer Brother    Social History  Substance Use Topics  . Smoking status: Former Smoker -- 0.75 packs/day for 24 years    Types: Cigarettes    Quit date: 01/11/1962  . Smokeless tobacco: Never Used  . Alcohol Use: No    Review of Systems  Constitutional:       + Hypotension  Cardiovascular: Positive for chest pain and palpitations.  Gastrointestinal: Negative for nausea and vomiting.  All other systems reviewed and are negative.   Allergies  Penicillins and Neomycin-bacitracin zn-polymyx  Home Medications   Prior to Admission medications   Medication Sig Start Date End Date Taking? Authorizing Provider  allopurinol (ZYLOPRIM) 100 MG tablet Take 1 tablet (100 mg total) by mouth daily. 07/21/14   Hendricks Limes, MD  amiodarone (PACERONE) 200 MG tablet TAKE 1 TABLET EVERY DAY 09/28/14   [name], MD  amLODipine (NORVASC) 5 MG tablet TAKE 1 TABLET EVERY DAY 09/15/14   [name], MD  aspirin 325 MG tablet Take 325 mg by mouth daily.      Historical Provider, MD  Cholecalciferol (VITAMIN D) 1000 UNITS capsule Take 1 capsule (1,000 Units total) by mouth daily. 01/05/11   Renella Cunas, MD  clonazePAM (KLONOPIN) 0.5 MG tablet 1/2 as needed if you wake up early Patient taking differently: Take 0.5 mg by mouth daily as needed (wake up early).  03/08/14   Hendricks Limes, MD  colchicine 0.6 MG tablet Take 0.6 mg by mouth daily as needed (gout flares). Take one tablet by mouth daily during an acute episode of gout. 04/03/11   Renella Cunas, MD  fish  oil-omega-3 fatty acids 1000 MG capsule Take 1 g by mouth daily.      Historical Provider, MD  Flaxseed, Linseed, (FLAX SEED OIL) 1000 MG CAPS Take 1 capsule by mouth daily.     Historical Provider, MD  furosemide (LASIX) 80 MG tablet TAKE 1 TABLET TWICE DAILY 09/15/14   [name], MD  levothyroxine (SYNTHROID, LEVOTHROID) 25 MCG tablet TAKE 1 TABLET EVERY DAY 09/15/14   [name], MD  metolazone (ZAROXOLYN) 2.5 MG tablet Take 2.5 mg by mouth daily as needed (sleep). DAILY AS NEEDED FOR  EDEMA    Historical Provider, MD  Multiple Vitamin (MULTIVITAMIN) tablet Take 1 tablet by mouth daily.      Historical Provider, MD  omeprazole (PRILOSEC) 20 MG capsule Take 1 capsule (20 mg total) by mouth daily. 02/04/14   [name], MD  potassium chloride (K-DUR) 10 MEQ tablet Take 1 tablet (10 mEq total) by mouth daily. 02/04/14   [name], MD  potassium chloride (K-DUR,KLOR-CON) 10 MEQ tablet TAKE 1 TABLET EVERY DAY 09/15/14   [name], MD  propranolol (INDERAL) 10 MG tablet Take 1 tablet (10 mg total) by mouth 2 (two) times daily. 07/26/14   Hendricks Limes, MD  sertraline (ZOLOFT) 50 MG tablet TAKE 1 TABLET (50 MG TOTAL) BY MOUTH DAILY. 09/28/14   Hendricks Limes, MD  vitamin C (ASCORBIC ACID) 500 MG tablet Take 1 tablet (500 mg total) by mouth daily. 01/05/11   Renella Cunas, MD   BP 117/63 mmHg  Pulse 69  Temp(Src) 97.6 F (36.4 C) (Oral)  Resp 19  SpO2 100% Physical Exam  Constitutional: He is oriented to person, place, and time. He appears well-developed and well-nourished. No distress.  HENT:  Head: Normocephalic and atraumatic.  Eyes: EOM are normal.  Neck: Normal range of motion.  Cardiovascular: Normal rate, normal heart sounds and intact distal pulses.  An irregularly irregular rhythm present.  Pulmonary/Chest: Effort normal and breath sounds normal. No respiratory distress.  Abdominal: Soft. He exhibits no distension. There is no tenderness.  Musculoskeletal: Normal  range of motion.  Neurological: He is alert and oriented to person, place, and time.  Skin: Skin is warm and dry.  Psychiatric: He has a normal mood and affect. Judgment normal.  Nursing note and vitals reviewed.   ED Course  Procedures   DIAGNOSTIC STUDIES:  Oxygen Saturation is 99% on RA, normal by my interpretation.    COORDINATION OF CARE:  2:04 AM Discussed treatment plan with pt and family at bedside and pt agreed to plan.  Labs Review Labs Reviewed  BASIC METABOLIC PANEL - Abnormal; Notable for the following:    Chloride 112 (*)    CO2 20 (*)    Glucose, Bld 114 (*)    BUN 53 (*)    Creatinine, Ser 1.84 (*)    Calcium 8.7 (*)    GFR calc non Af Amer 31 (*)    GFR calc Af Amer 36 (*)    All other components within normal limits  CBC - Abnormal; Notable for the following:    WBC 1.7 (*)    RBC 3.10 (*)    Hemoglobin 9.1 (*)    HCT 28.4 (*)    RDW 17.0 (*)    Platelets 30 (*)    All other components within normal limits  TROPONIN I   BUN  Date Value Ref Range Status  10/04/2014 53* 6 - 20 mg/dL Final  06/22/2014 53* 6 - 23 mg/dL Final  06/09/2014 64* 6 - 23 mg/dL Final  05/28/2014 29* 6 - 23 mg/dL Final  08/03/2013 38.6* 7.0 - 26.0 mg/dL Final  12/08/2012 42.8* 7.0 - 26.0 mg/dL Final   CREATININE  Date Value Ref Range Status  08/03/2013 2.1* 0.7 - 1.3 mg/dL Final  12/08/2012 2.1* 0.7 - 1.3 mg/dL Final   CREAT  Date Value Ref Range Status  04/26/2014 2.52* 0.50 - 1.35 mg/dL Final  04/26/2014 2.50* 0.50 - 1.35 mg/dL Final  04/20/2014 2.50* 0.50 - 1.35 mg/dL Final    Comment:  REFER TO [id number] FOR CRE=2.50  04/20/2014 2.73* 0.50 - 1.35 mg/dL Final   CREATININE, SER  Date Value Ref Range Status  10/04/2014 1.84* 0.61 - 1.24 mg/dL Final  06/22/2014 2.16* 0.40 - 1.50 mg/dL Final  06/09/2014 2.29* 0.40 - 1.50 mg/dL Final  05/28/2014 1.65* 0.40 - 1.50 mg/dL Final       Imaging Review Dg Chest 2 View  10/04/2014   CLINICAL DATA:  Chest pain  and atrial fibrillation  EXAM: CHEST  2 VIEW  COMPARISON:  05/15/2014  FINDINGS: Chronic cardiomegaly and aortic tortuosity, status post transcatheter aortic valve replacement on top of previous aortic valve replacement.  Low lung volumes. There is no edema, consolidation, effusion, or pneumothorax. No acute osseous finding.  Cholecystectomy.  Known splenomegaly in this patient with cirrhosis.  IMPRESSION: No acute finding.   Electronically Signed   By: Monte Fantasia M.D.   On: 10/04/2014 02:27  I have personally reviewed and evaluated these images and lab results as part of my medical decision-making.  EKG Interpretation #1 Date/Time:  Monday October 04 2014 00:22:24 EDT Ventricular Rate:  68 PR Interval:    QRS Duration: 122 QT Interval:  475 QTC Calculation: 505 R Axis:   -30 Text Interpretation:  Atrial fibrillation LVH with IVCD and secondary  repol abnrm Prolonged QT interval afib new since prior tracing Confirmed  by Kaladin Noseworthy  MD, Rubee Vega ([id number]) on 10/04/2014 1:59:27 AM     EKG Interpretation #2  Date/Time:  Monday October 04 2014 05:00:11 EDT Ventricular Rate:  64 PR Interval:  162 QRS Duration: 132 QT Interval:  494 QTC Calculation: 510 R Axis:   -38 Text Interpretation:  afib Nonspecific IVCD with LAD LVH with secondary repolarization abnormality No significant change was found Confirmed by Tom Ragsdale  MD, Khyla Mccumbers ([id number]) on 10/04/2014 5:30:48 AM             MDM   Final diagnoses:  Weakness  Paroxysmal atrial fibrillation    Pt continues to feel weak. Maintains rate controlled afib at this time despite procainamide. Will admit for ongoing workup. Transient hypotension while in ER. Fluids given.    I personally performed the services described in this documentation, which was scribed in my presence. The recorded information has been reviewed and is accurate.      Jola Schmidt, MD 10/04/14 [phone]

## 2014-10-04 NOTE — Progress Notes (Signed)
This admission has been reviewed and determined not to meet inpatient level of care. Both attending Physician and Medical Director are in agreement this should be an Observation encounter according to the Medicare Conditions of Participation as set forth in CFR 42 Chapter 456 482.12 (c) and the Medicare Condition Code-44 Regulations CFR 42 Chapter 100 - 04 50.3. The Patient and/or Patient Representative was notified via delivery of the "MEDICARE OBSERVATION STATUS NOTIFICATION".              

## 2014-10-04 NOTE — ED Notes (Signed)
Pt arrives via EMS from home, general mailase. Long term cancer pt, terminal. eno recent illness, nausea, vomiting. Hypotensive intitally.

## 2014-10-04 NOTE — ED Notes (Signed)
Campos MD at bedside. 

## 2014-10-04 NOTE — Progress Notes (Signed)
Patient admitted after midnight, please see H&P.  Will ask cards to see-- per Dr. Benay Spice note from 9/21 need to know whether to continue ASA  Walter Bear DO

## 2014-10-04 NOTE — H&P (Addendum)
[facility] History and Physical  Walter Walter ZLD:[id number] DOB: 02/25/1922 DOA: 10/04/2014  Referring physician: ED physician PCP: [name], MD  Specialists:   Chief Complaint: chest discomfort  HPI: Walter Walter is a 81 y.o. male with PMH of chronic pancytopenia/splenomegaly (followed by Dr. Benay Spice), possible chronic lymphoproliferative disorder or splenic lymphoma, severe aortic stenosis, severe aortic valve stenosis (s/p of AVR at [facility] on 01/23/2011, s/p of AVR 05/14/2014), hypertension, diet-controlled diabetes, GERD, hypothyroidism, gout, depression, diastolic cardiac heart failure (EF CT 5-70% with grade 2 diastolic dysfunction), throat cancer (s/p of laser treatment), PAF not on AC, who presents with chest discomfort.  Patient reports that he has been having chest discomfort since yesterday. It is located in the substernal area. He states that he has had skipped heartbeat many times. He does not have chest pain or shortness breath. He has mild nausea, but no vomiting, abdominal pain or diarrhea. No cough, fever or chills. He has chronic right shoulder pain, no symptoms of UTI, unilateral weakness. He states that he bruises easily, but no active bleeding, hematochezia or hematuria. Of note, he is taking aspirin 325 mg daily. Per Char Review, his oncologist, Dr. Gearldine Shown was planning to discuss with his cardiologist, Dr. Burt Knack about the possibility of the discontinuing his ASA due to his easily bruising. His blood pressure was 90/53, which improved to 108/58 after 1 L of normal saline bolus.  In ED, patient was found to have pancytopenia with WBC 1.7 which was 2.0, hemoglobin 9.1 which was 10.3, platelet 30 which was 22 on 09/29/14. Troponin negative, temperature normal, stable renal function, negative chest x-ray. EKG showed atrial fibrillation and prolonged QTc interval at 510. Patient is admitted to inpatient for further evaluate and treatment.  Where does patient live?    At home  Can patient participate in ADLs?  Yes  Review of Systems:   General: no fevers, chills, no changes in body weight, has poor appetite, has fatigue HEENT: no blurry vision, hearing changes or sore throat Pulm: no dyspnea, coughing, wheezing CV: has chest discomfort and skipped heart beat.  Abd: has nausea, no vomiting, abdominal pain, diarrhea, constipation GU: no dysuria, burning on urination, increased urinary frequency, hematuria  Ext: has leg edema Neuro: no unilateral weakness, numbness, or tingling, no vision change or hearing loss Skin: has bruises. MSK: Has right shoulder pain Heme: No easy bruising.  Travel history: No recent long distant travel.  Allergy:  Allergies  Allergen Reactions  . Penicillins Other (See Comments)    Does not remember reaction (~year 1950)  . Neomycin-Bacitracin Zn-Polymyx Other (See Comments)    ? Reaction (thinks he remembers redness)    Past Medical History  Diagnosis Date  . Stroke   . Atrial fibrillation     a. amiodarone therapy; not felt to be a candidate for anticoagulation - bruises easily.  . Bradycardia   . Aortic stenosis     a. Previously severe -> s/p minimally invasive tissue AVR with Dr. Evelina Dun at [facility] 01/2011 (pre-AVR cath with no obs CAD);  b. 05/2014 s/p TAVR (23 mm Edwards Sapien 3).  . Hypertension   . Anemia   . Action tremor   . Thrombocytopenia     Dr. Benay Spice  . Colon polyp   . Anxiety   . Splenomegaly   . Gout   . GERD (gastroesophageal reflux disease)   . Hypothyroidism   . Leukopenia     Chronic pancytopenia  . Degenerative disc disease   .  Joint effusion, knee     left knee  . Synovial cyst of popliteal space   . Cellulitis of left leg 10/11-16/2012  . CKD (chronic kidney disease), stage III   . Chronic diastolic CHF (congestive heart failure)     a. 12/15/2012 TEE: EF 60-65%, no veg.  . Bacteremia     a. 12/2012 - S bovis;  b. 12/2012 TEE w/o veg;  c. Seeing ID->Rocephin therapy extended to  01/25/2013 via PICC for possible endocarditis (No veg on TEE).  . High cholesterol   . Diabetes mellitus     "borderline" (01/05/2013)  . History of blood transfusion 01/2011; 11/2012  . H/O hiatal hernia   . Throat cancer     s/p lasered  . Diverticulosis   . Severe aortic insufficiency   . S/P TAVR (transcatheter aortic valve replacement)     a. 05/14/2014 TAVR: 23 mm Edwards Sapien 3 transcatheter heart valve placed valve-in-valve for prosthetic valve dysfunction via open right transfemoral approach    Past Surgical History  Procedure Laterality Date  . Cholecystectomy    . Joint effusion      left knee  . Aortic valve replacement  01/23/2011    via minimally invasive approach per Dr Evelina Dun, [facility]  . Tee without cardioversion N/A 12/15/2012    Procedure: TRANSESOPHAGEAL ECHOCARDIOGRAM (TEE);  Surgeon: [name], MD;  Location: [facility] ENDOSCOPY;  Service: Cardiovascular;  Laterality: N/A;  . Cardiac valve replacement    . Inguinal hernia repair Right   . Excisional hemorrhoidectomy    . Cardiac catheterization    . Surgery scrotal / testicular      "removed one" (01/05/2013)  . 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 Tee without cardioversion N/A 03/26/2014    Procedure: TRANSESOPHAGEAL ECHOCARDIOGRAM (TEE);  Surgeon: Josue Hector, MD;  Location: [facility] ENDOSCOPY;  Service: Cardiovascular;  Laterality: N/A;  . Transcatheter aortic valve replacement, transfemoral Right 05/14/2014    Procedure: TRANSCATHETER AORTIC VALVE REPLACEMENT, TRANSFEMORAL;  Surgeon: [name], MD;  Location: [place];  Service: Open Heart Surgery;  Laterality: Right;  . Tee without cardioversion N/A 05/14/2014    Procedure: TRANSESOPHAGEAL ECHOCARDIOGRAM (TEE);  Surgeon: [name], MD;  Location: [place];  Service: Open Heart Surgery;  Laterality: N/A;    Social  History:  reports that he quit smoking about 52 years ago. His smoking use included Cigarettes. He has a 18 pack-year smoking history. He has never used smokeless tobacco. He reports that he does not drink alcohol or use illicit drugs.  Family History:  Family History  Problem Relation Age of Onset  . Colon cancer    . Stroke    . Cancer Other     colon  . Stroke Other   . Lung cancer Mother   . Heart disease Father   . Cancer Brother      Prior to Admission medications   Medication Sig Start Date End Date Taking? Authorizing Provider  allopurinol (ZYLOPRIM) 100 MG tablet Take 1 tablet (100 mg total) by mouth daily. 07/21/14   Hendricks Limes, MD  amiodarone (PACERONE) 200 MG tablet TAKE 1 TABLET EVERY DAY 09/28/14   [name], MD  amLODipine (NORVASC) 5 MG tablet TAKE 1 TABLET EVERY DAY 09/15/14   [name], MD  aspirin 325 MG tablet Take 325  mg by mouth daily.      Historical Provider, MD  Cholecalciferol (VITAMIN D) 1000 UNITS capsule Take 1 capsule (1,000 Units total) by mouth daily. 01/05/11   Renella Cunas, MD  clonazePAM (KLONOPIN) 0.5 MG tablet 1/2 as needed if you wake up early Patient taking differently: Take 0.5 mg by mouth daily as needed (wake up early).  03/08/14   Hendricks Limes, MD  colchicine 0.6 MG tablet Take 0.6 mg by mouth daily as needed (gout flares). Take one tablet by mouth daily during an acute episode of gout. 04/03/11   Renella Cunas, MD  fish oil-omega-3 fatty acids 1000 MG capsule Take 1 g by mouth daily.      Historical Provider, MD  Flaxseed, Linseed, (FLAX SEED OIL) 1000 MG CAPS Take 1 capsule by mouth daily.     Historical Provider, MD  furosemide (LASIX) 80 MG tablet TAKE 1 TABLET TWICE DAILY 09/15/14   [name], MD  levothyroxine (SYNTHROID, LEVOTHROID) 25 MCG tablet TAKE 1 TABLET EVERY DAY 09/15/14   [name], MD  metolazone (ZAROXOLYN) 2.5 MG tablet Take 2.5 mg by mouth daily as needed (sleep). DAILY AS NEEDED FOR EDEMA    Historical  Provider, MD  Multiple Vitamin (MULTIVITAMIN) tablet Take 1 tablet by mouth daily.      Historical Provider, MD  omeprazole (PRILOSEC) 20 MG capsule Take 1 capsule (20 mg total) by mouth daily. 02/04/14   [name], MD  potassium chloride (K-DUR) 10 MEQ tablet Take 1 tablet (10 mEq total) by mouth daily. 02/04/14   [name], MD  potassium chloride (K-DUR,KLOR-CON) 10 MEQ tablet TAKE 1 TABLET EVERY DAY 09/15/14   [name], MD  propranolol (INDERAL) 10 MG tablet Take 1 tablet (10 mg total) by mouth 2 (two) times daily. 07/26/14   Hendricks Limes, MD  sertraline (ZOLOFT) 50 MG tablet TAKE 1 TABLET (50 MG TOTAL) BY MOUTH DAILY. 09/28/14   Hendricks Limes, MD  vitamin C (ASCORBIC ACID) 500 MG tablet Take 1 tablet (500 mg total) by mouth daily. 01/05/11   Renella Cunas, MD    Physical Exam: Filed Vitals:   10/04/14 0515 10/04/14 0545 10/04/14 0600 10/04/14 0630  BP: 105/53 103/52 103/56 104/64  Pulse: 65 66 66 68  Temp:      TempSrc:      Resp: 14 12 14 15   SpO2: 99% 100% 98% 95%   General: Not in acute distress HEENT:       Eyes: PERRL, EOMI, no scleral icterus.       ENT: No discharge from the ears and nose, no pharynx injection, no tonsillar enlargement.        Neck: No JVD, no bruit, no mass felt. Heme: No neck lymph node enlargement. Cardiac: S1/S2, irregularly irregular rhythm, 3/6 systolic murmurs, No gallops or rubs. Pulm:  No rales, wheezing, rhonchi or rubs. Abd: Soft, nondistended, nontender, no rebound pain, no organomegaly, BS present. Ext: 2+ pitting leg edema bilaterally with venous insufficiency changes. 2+DP/PT pulse bilaterally. Musculoskeletal: No joint deformities, No joint redness or warmth, no limitation of ROM in spin. Skin: No rashes. Has bruises over his extremities. Neuro: Alert, oriented X3, cranial nerves II-XII grossly intact, muscle strength 4/5 in all extremities, sensation to light touch intact.  Psych: Patient is not psychotic, no suicidal or  hemocidal ideation.  Labs on Admission:  Basic Metabolic Panel:  Recent Labs Lab 10/04/14 0149  NA 142  K 3.6  CL 112*  CO2  20*  GLUCOSE 114*  BUN 53*  CREATININE 1.84*  CALCIUM 8.7*   Liver Function Tests: No results for input(s): AST, ALT, ALKPHOS, BILITOT, PROT, ALBUMIN in the last 168 hours. No results for input(s): LIPASE, AMYLASE in the last 168 hours. No results for input(s): AMMONIA in the last 168 hours. CBC:  Recent Labs Lab 09/29/14 0947 10/04/14 0149  WBC 2.0* 1.7*  NEUTROABS 1.6  --   HGB 10.3* 9.1*  HCT 32.7* 28.4*  MCV 92.6 91.6  PLT 22* 30*   Cardiac Enzymes:  Recent Labs Lab 10/04/14 0149  TROPONINI 0.03    BNP (last 3 results)  Recent Labs  05/13/14 1751  BNP 642.8*    ProBNP (last 3 results) No results for input(s): PROBNP in the last 8760 hours.  CBG: No results for input(s): GLUCAP in the last 168 hours.  Radiological Exams on Admission: Dg Chest 2 View  10/04/2014   CLINICAL DATA:  Chest pain and atrial fibrillation  EXAM: CHEST  2 VIEW  COMPARISON:  05/15/2014  FINDINGS: Chronic cardiomegaly and aortic tortuosity, status post transcatheter aortic valve replacement on top of previous aortic valve replacement.  Low lung volumes. There is no edema, consolidation, effusion, or pneumothorax. No acute osseous finding.  Cholecystectomy.  Known splenomegaly in this patient with cirrhosis.  IMPRESSION: No acute finding.   Electronically Signed   By: Monte Fantasia M.D.   On: 10/04/2014 02:27    EKG: Independently reviewed.  Abnormal findings: QTC,  atrial fibrillation  Assessment/Plan Principal Problem:   Chest discomfort Active Problems:   Gout   Thrombocytopenia   Essential hypertension   CAD, NATIVE VESSEL   Splenomegaly   Other pancytopenia   Hypothyroidism   Protein-calorie malnutrition, severe   PAF (paroxysmal atrial fibrillation)   CKD (chronic kidney disease), stage III   Depression   Severe aortic stenosis   S/P  TAVR (transcatheter aortic valve replacement)   Generalized weakness   GERD (gastroesophageal reflux disease)   Chronic diastolic congestive heart failure  Assessment/Plan:  Chest discomfort and CAD: Likely due to atrial fibrillation. Pt has chronic PAF. Most of the time, patient has sinus rhythm, but he presents with atrial fibrillation today. Though he does not have chest pain or shortness of breath, still need to rule out ACS. Initial troponin is negative.  - will admit to Tele bed  - cycle CE q6 x3 and repeat her EKG in the am  - check lipase - on aspirin - prn hydroxyzine - 2d echo - May need to discuss with his cardiologist, Dr. Burt Knack in morning  PAF:  CHA2DS2-VASc Score is 4, needs oral anticoagulation. Patient is not on AC due to pancytopenia and easily bruising. Patient normally has sinus rhythm at the baseline, but presents with atrial fibrillation today. EDP gave one dose of procainamide to try to convert his rhythm without success. Heart rate is well controlled. -Continue amiodarone, propranolol -check TSH level  Aortic stenosis and s/p of AVR: pt is on ASA 325 mg daily. Dr. Gearldine Shown was planning to discuss with his cardiologist, Dr. Burt Knack about the possibility of the discontinuing his ASA due to his easily bruising.  -I will continue his home ASA for now. -Please discuss with his cardiologist in morning.  Chronic diastolic congestive heart failure: 2-D echo on 06/22/14 showed EF of 65-70% with grade 2 diastolic dysfunction. Patient does not have chest pain, shortness of breath. He has 2+ leg edema, which is likely due to both congestive heart failure and  venous insufficiency. Patient does not seem to have CHF exacerbation clinically. -will decrease his Lasix dose from 80-40 mg twice a day due to soft blood pressure -Check BMP -Continue aspirin and propranolol  Chronic pancytopenia/splenomegaly: Patient is a followed up by Dr. Benay Spice. Last seen was 09/29/14. Per his  note, this is likely related to a chronic lymphoproliferative disorder or splenic lymphoma. His platelet is stable 22-->30, hemoglobin dropped to slipped slightly from 10.3-->9.1, WBC stable 1.7-->2.0. -follow up with Dr. Benay Spice.  Gout: stable. -Continue allopurinol and colchicine  HTN: Blood pressure was a soft on admission. -Decrease Lasix from 80-40 mg twice a day -Hold amlodipine -Continue propranolol  Hypothyroidism: Last TSH was 5.15 on 03/11/14, which was okay for his age  -Continue home Synthroid -f/u TSH  Protein-calorie malnutrition, severe: -Ensure  CKD (chronic kidney disease), stage III: Stable. His baseline creatinine is 1.6-2.3 recently. His creatinine is 1.84 and a BUN 53 on admission, which is close to baseline. -Follow-up renal function, BMP  Generalized weakness: Most likely due to deconditioning due to multiple comorbidities. -Nutrition supplement -PT/OT  Depression and anxiety: Stable, no suicidal or homicidal ideations. -Continue home medications: Clonazepam, Zoloft  GERD: -Protonix  DVT ppx: SCD Code Status: DNR (I discussed with patient about CODE STATUS, he states it very clearly that he wants to be DO NOT RESUSCITATE). Family Communication: None at bed side. Disposition Plan: Admit to inpatient   Date of Service 10/04/2014    Ivor Costa [facility] Pager [phone]  If 7PM-7AM, please contact night-coverage [email or web address] Password [facility] 10/04/2014, 6:40 AM

## 2014-10-04 NOTE — Evaluation (Signed)
Physical Therapy Evaluation Patient Details Name: [name] MRN: [id number] DOB: 1922/05/07 Today's Date: 10/04/2014   History of Present Illness  [name] is a 81 y.o. male with PMH of chronic pancytopenia/splenomegaly (followed by Dr. Benay Spice), possible chronic lymphoproliferative disorder or splenic lymphoma, severe aortic stenosis, severe aortic valve stenosis (s/p of AVR at [facility] on 01/23/2011, s/p of AVR 05/14/2014), hypertension, diet-controlled diabetes, GERD, hypothyroidism, gout, depression, diastolic cardiac heart failure (EF CT 5-70% with grade 2 diastolic dysfunction), throat cancer (s/p of laser treatment), PAF not on AC, who presents with chest discomfort.  Clinical Impression  Pt admitted with above diagnosis. Pt currently with functional limitations due to the deficits listed below (see PT Problem List).  Pt will benefit from skilled PT to increase their independence and safety with mobility to allow discharge to the venue listed below. Pt able to ambulate 250' without AD with min/guard with 1 LOB requiring MIN  A. Recommend HHPT at time of d/c.      Follow Up Recommendations Home health PT    Equipment Recommendations  None recommended by PT    Recommendations for Other Services       Precautions / Restrictions Precautions Precautions: None Restrictions Weight Bearing Restrictions: No      Mobility  Bed Mobility Overal bed mobility: Modified Independent             General bed mobility comments: bed flat  Transfers Overall transfer level: Needs assistance Equipment used: None Transfers: Sit to/from Stand Sit to Stand: Min guard         General transfer comment: min /guard for steadying  Ambulation/Gait Ambulation/Gait assistance: Min guard Ambulation Distance (Feet): 250 Feet Assistive device: None Gait Pattern/deviations: Step-through pattern;Decreased step length - right;Decreased step length - left Gait velocity: decreased    General Gait Details: Slightly guarded gait with decreased speed.  As gait progressed, his cadence and step length improved. One LOB with turning head sideways requiring MIN A.  HR WNL during gait.  Stairs            Wheelchair Mobility    Modified Rankin (Stroke Patients Only)       Balance Overall balance assessment: Needs assistance   Sitting balance-Leahy Scale: Good       Standing balance-Leahy Scale: Fair                 High Level Balance Comments: 1 LOB with head turn during gait             Pertinent Vitals/Pain Pain Assessment: Faces Faces Pain Scale: No hurt    Home Living Family/patient expects to be discharged to:: Private residence Living Arrangements: Spouse/significant other Available Help at Discharge: Available PRN/intermittently Type of Home: House Home Access: Stairs to enter Entrance Stairs-Rails: Left Entrance Stairs-Number of Steps: 3 Home Layout: One level   Additional Comments: Lives with wife who is currently admitted to hospital.  Retired from [employer].    Prior Function Level of Independence: Independent         Comments: drives     Hand Dominance        Extremity/Trunk Assessment   Upper Extremity Assessment: Overall WFL for tasks assessed           Lower Extremity Assessment: Overall WFL for tasks assessed      Cervical / Trunk Assessment: Normal  Communication   Communication: HOH  Cognition Arousal/Alertness: Awake/alert Behavior During Therapy: WFL for tasks assessed/performed Overall Cognitive Status: Within Functional  Limits for tasks assessed                      General Comments General comments (skin integrity, edema, etc.): Pt states he just doesn't feel well.    Exercises        Assessment/Plan    PT Assessment Patient needs continued PT services  PT Diagnosis Difficulty walking   PT Problem List Decreased balance;Decreased mobility;Decreased activity  tolerance  PT Treatment Interventions Gait training;Functional mobility training;Therapeutic activities;Therapeutic exercise;Stair training;Balance training   PT Goals (Current goals can be found in the Care Plan section) Acute Rehab PT Goals Patient Stated Goal: Feel better PT Goal Formulation: With patient Time For Goal Achievement: 10/18/14 Potential to Achieve Goals: Good    Frequency Min 3X/week   Barriers to discharge Decreased caregiver support Wife currently in hospital    Co-evaluation               End of Session Equipment Utilized During Treatment: Gait belt Activity Tolerance: No increased pain;Patient tolerated treatment well Patient left: in chair;with call bell/phone within reach Nurse Communication: Mobility status         Time: 1886-7737 PT Time Calculation (min) (ACUTE ONLY): 25 min   Charges:   PT Evaluation $Initial PT Evaluation Tier I: 1 Procedure PT Treatments $Gait Training: 8-22 mins   PT G Codes:        SMITH,KAREN LUBECK 10/04/2014, 1:42 PM

## 2014-10-04 NOTE — Progress Notes (Signed)
  Echocardiogram 2D Echocardiogram has been performed.  Darlina Sicilian M 10/04/2014, 10:49 AM

## 2014-10-04 NOTE — Progress Notes (Signed)
Utilization review completed.  

## 2014-10-05 ENCOUNTER — Telehealth: Payer: Self-pay | Admitting: *Deleted

## 2014-10-05 DIAGNOSIS — E039 Hypothyroidism, unspecified: Secondary | ICD-10-CM | POA: Diagnosis not present

## 2014-10-05 DIAGNOSIS — E1122 Type 2 diabetes mellitus with diabetic chronic kidney disease: Secondary | ICD-10-CM | POA: Diagnosis not present

## 2014-10-05 DIAGNOSIS — D696 Thrombocytopenia, unspecified: Secondary | ICD-10-CM | POA: Diagnosis not present

## 2014-10-05 DIAGNOSIS — I952 Hypotension due to drugs: Secondary | ICD-10-CM

## 2014-10-05 DIAGNOSIS — R531 Weakness: Secondary | ICD-10-CM

## 2014-10-05 DIAGNOSIS — Z954 Presence of other heart-valve replacement: Secondary | ICD-10-CM

## 2014-10-05 DIAGNOSIS — I5032 Chronic diastolic (congestive) heart failure: Secondary | ICD-10-CM | POA: Diagnosis not present

## 2014-10-05 DIAGNOSIS — I10 Essential (primary) hypertension: Secondary | ICD-10-CM

## 2014-10-05 DIAGNOSIS — I129 Hypertensive chronic kidney disease with stage 1 through stage 4 chronic kidney disease, or unspecified chronic kidney disease: Secondary | ICD-10-CM | POA: Diagnosis not present

## 2014-10-05 DIAGNOSIS — R0789 Other chest pain: Secondary | ICD-10-CM | POA: Diagnosis not present

## 2014-10-05 LAB — GLUCOSE, CAPILLARY
GLUCOSE-CAPILLARY: 126 mg/dL — AB (ref 65–99)
Glucose-Capillary: 284 mg/dL — ABNORMAL HIGH (ref 65–99)

## 2014-10-05 LAB — COMPREHENSIVE METABOLIC PANEL
ALK PHOS: 69 U/L (ref 38–126)
ALT: 20 U/L (ref 17–63)
AST: 29 U/L (ref 15–41)
Albumin: 3.2 g/dL — ABNORMAL LOW (ref 3.5–5.0)
Anion gap: 9 (ref 5–15)
BUN: 51 mg/dL — AB (ref 6–20)
CALCIUM: 8.7 mg/dL — AB (ref 8.9–10.3)
CHLORIDE: 109 mmol/L (ref 101–111)
CO2: 23 mmol/L (ref 22–32)
CREATININE: 1.94 mg/dL — AB (ref 0.61–1.24)
GFR, EST AFRICAN AMERICAN: 33 mL/min — AB (ref >60)
GFR, EST NON AFRICAN AMERICAN: 29 mL/min — AB (ref >60)
Glucose, Bld: 138 mg/dL — ABNORMAL HIGH (ref 65–99)
Potassium: 4.1 mmol/L (ref 3.5–5.1)
Sodium: 141 mmol/L (ref 135–145)
Total Bilirubin: 1.1 mg/dL (ref 0.3–1.2)
Total Protein: 5.2 g/dL — ABNORMAL LOW (ref 6.5–8.1)

## 2014-10-05 LAB — CBC
HCT: 30.6 % — ABNORMAL LOW (ref 39.0–52.0)
Hemoglobin: 9.9 g/dL — ABNORMAL LOW (ref 13.0–17.0)
MCH: 29.4 pg (ref 26.0–34.0)
MCHC: 32.4 g/dL (ref 30.0–36.0)
MCV: 90.8 fL (ref 78.0–100.0)
PLATELETS: 25 10*3/uL — AB (ref 150–400)
RBC: 3.37 MIL/uL — ABNORMAL LOW (ref 4.22–5.81)
RDW: 17 % — AB (ref 11.5–15.5)
WBC: 1.5 10*3/uL — AB (ref 4.0–10.5)

## 2014-10-05 LAB — BRAIN NATRIURETIC PEPTIDE: B NATRIURETIC PEPTIDE 5: 887.4 pg/mL — AB (ref 0.0–100.0)

## 2014-10-05 MED ORDER — INSULIN ASPART 100 UNIT/ML ~~LOC~~ SOLN: 0.0000 [IU] | Freq: Three times a day (TID) | SUBCUTANEOUS | Status: DC

## 2014-10-05 MED ORDER — ENSURE ENLIVE PO LIQD: 237.0000 mL | Freq: Two times a day (BID) | ORAL | Status: DC

## 2014-10-05 NOTE — Consult Note (Signed)
   [facility] CM Inpatient Consult   10/05/2014  LYMON KIDNEY 11/03/1922 [id number]   Came to visit patient at bedside to offer and explain [facility] [employer] services. Explained services and Mr. Ledet stated, " I do not need those services". He accepted a [facility] brochure to call in future if needed. Made inpatient RNCM aware.  Marthenia Rolling, MSN-Ed, RN,BSN [facility] Liaison [phone]

## 2014-10-05 NOTE — Progress Notes (Signed)
Received a critical value lab of platelet count of 25 and was informed that this was supposed to have been reported this AM. Pt is already discharged and platelet count is consistent with previous labs.   Nahara Dona, RN

## 2014-10-05 NOTE — Progress Notes (Signed)
Subjective:  No chest apin, ambulated this am  Objective:  Vital Signs in the last 24 hours: Temp:  [97.7 F (36.5 C)-98.6 F (37 C)] 97.7 F (36.5 C) (09/27 0415) Pulse Rate:  [68-71] 71 (09/27 0415) Resp:  [18] 18 (09/27 0415) BP: (95-105)/(49-58) 100/55 mmHg (09/27 0415) SpO2:  [98 %-99 %] 98 % (09/27 0415) Weight:  [179 lb (81.194 kg)] 179 lb (81.194 kg) (09/27 0415)  Intake/Output from previous day:  Intake/Output Summary (Last 24 hours) at 10/05/14 1052 Last data filed at 10/05/14 0835  Gross per 24 hour  Intake    600 ml  Output   1200 ml  Net   -600 ml    Physical Exam: General appearance: alert, cooperative and no distress Lungs: clear to auscultation bilaterally Heart: irregularly irregular rhythm and soft sytolic murmur Extremities: 1+ LE edema, chronic venous changes   Rate: 75  Rhythm: atrial fibrillation  Lab Results:  Recent Labs  10/04/14 0149 10/05/14 0421  WBC 1.7* 1.5*  HGB 9.1* 9.9*  PLT 30* 25*    Recent Labs  10/04/14 0149 10/05/14 0421  NA 142 141  K 3.6 4.1  CL 112* 109  CO2 20* 23  GLUCOSE 114* 138*  BUN 53* 51*  CREATININE 1.84* 1.94*    Recent Labs  10/04/14 1110 10/04/14 1809  TROPONINI <0.03 <0.03    Recent Labs  10/04/14 0618  INR 1.32    Scheduled Meds: . allopurinol  100 mg Oral Daily  . amiodarone  200 mg Oral Daily  . cholecalciferol  1,000 Units Oral Daily  . feeding supplement (ENSURE ENLIVE)  237 mL Oral BID BM  . furosemide  40 mg Oral BID  . levothyroxine  25 mcg Oral QAC breakfast  . multivitamin with minerals  1 tablet Oral Daily  . omega-3 acid ethyl esters  1 g Oral Daily  . pantoprazole  40 mg Oral Daily  . potassium chloride  10 mEq Oral Daily  . propranolol  10 mg Oral BID  . sertraline  50 mg Oral Daily  . sodium chloride  3 mL Intravenous Q12H  . vitamin C  500 mg Oral Daily   Continuous Infusions:  PRN Meds:.acetaminophen **OR** acetaminophen, clonazePAM, colchicine,  hydrOXYzine   Imaging: Dg Chest 2 View  10/04/2014   CLINICAL DATA:  Chest pain and atrial fibrillation  EXAM: CHEST  2 VIEW  COMPARISON:  05/15/2014  FINDINGS: Chronic cardiomegaly and aortic tortuosity, status post transcatheter aortic valve replacement on top of previous aortic valve replacement.  Low lung volumes. There is no edema, consolidation, effusion, or pneumothorax. No acute osseous finding.  Cholecystectomy.  Known splenomegaly in this patient with cirrhosis.  IMPRESSION: No acute finding.   Electronically Signed   By: Monte Fantasia M.D.   On: 10/04/2014 02:27    Cardiac Studies: Echo 10/04/14 Impressions:  - A bioprosthetic Edward-SAPIEN valve sits well in the aortic position. There is no central aortic regurgitation and no paravalvular leak. Transaortic gradients are elevated but lower than on 06/22/2014. Filling pressures reamin elevated. Mild pulmonary hypertension.   Assessment/Plan:    Principal Problem:   Chest discomfort Active Problems:   PAF (paroxysmal atrial fibrillation)   S/P TAVR (transcatheter aortic valve replacement)   Thrombocytopenia   Essential hypertension   Other pancytopenia   CKD (chronic kidney disease), stage III   Generalized weakness   Chronic diastolic congestive heart failure   Hypothyroidism   Depression   GERD (gastroesophageal reflux disease)  Splenomegaly   PLAN: Will review with MD, possible discharge later today.   Kerin Ransom PA-C 10/05/2014, 10:52 AM [phone]  I saw examined the patient is morning along with Mr. Rosalyn Gess, [place]. I reviewed the chart and discuss the case with the patient as well as Dr. Burt Knack.  Overall, the patient does seem symptomatically improved. His blood pressure has improved. He has some worsening edema which goes along with his IV fluid administration as well as reduce Lasix dosing. -- Per discussion with Dr. Burt Knack, his edema is pretty much chronic.  Chest discomfort was likely not  anginal in noncardiac in nature. Could very well have been related to bradycardia or hypotension. Main concern was his hypotension and dizziness. The more concerning symptoms have resolved.   His heart rate is improved, and is stable on amiodarone. No plans for anticoagulation either with a endocrine or aspirin due to his low platelets. Please see Dr. Antionette Char note. On low-dose Inderal. He does have some potential for tachybradycardia, but has been stable.-- With inability to coagulate, he is therefore not a cardioversion candidate   I would return him to his home dose of Lasix starting tomorrow but would actually use 80 mg twice a day tomorrow and then 80 mg in the morning and 40 near evening starting the following day.  Amlodipine was discontinued. This would be in deference to his hypertension allowing for permissive hypertension.    Overall from a cardiac standpoint, I think is clinically ready for discharge today. Continue to hold amlodipine.   Leonie Man, M.D., M.S. Interventional Cardiologist   Pager # [phone]

## 2014-10-05 NOTE — Care Management Note (Addendum)
Case Management Note Marvetta Gibbons RN, BSN Unit 2W-Case Manager [phone]  Patient Details  Name: [name] MRN: [id number] Date of Birth: Apr 12, 1922  Subjective/Objective:      Pt admitted with chest discomfort              Action/Plan: PTA pt lived at home with wife- (who is also admitted to hospital) PT eval ordered recommendation for [facility]- MD please order HH-PT for discharge- [facility] also to see pt for possible f/u at home. NCM to f/u with pt prior to discharge.  Expected Discharge Date:                  Expected Discharge Plan:  [name]  In-House Referral:     Discharge planning Services  CM Consult  Post Acute Care Choice:  Home Health Choice offered to:  Patient  DME Arranged:    DME Agency:     HH Arranged:  Patient Refused [place] Agency:     Status of Service:  Completed, signed off  Medicare Important Message Given:    Date Medicare IM Given:    Medicare IM give by:    Date Additional Medicare IM Given:    Additional Medicare Important Message give by:     If discussed at [place] of Stay Meetings, dates discussed:    Additional Comments:  10/05/14- pt for d/c home today- Recommendations for HH-PT- order placed- in to speak with pt to offer choice for [facility] agency- per conversation with pt he does not feel like he needs HH- and politely declines any referral at this time- pt states he feels like he will get his strength back- has a cane at home- but reports that he doesn't use it. No further CM needs noted.  Dawayne Patricia, RN 10/05/2014, 3:22 PM

## 2014-10-05 NOTE — Telephone Encounter (Signed)
Received message that pt has been admitted to [facility]: [name] because his "heart was out of rhythm." Dr. Benay Spice made aware.

## 2014-10-05 NOTE — Evaluation (Signed)
Occupational Therapy Evaluation Patient Details Name: [name] MRN: [id number] DOB: [birth year] Today's Date: 10/05/2014    History of Present Illness [name] is a 81 y.o. male with PMH of chronic pancytopenia/splenomegaly (followed by Dr. 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   Clinical Impression   Patient admitted with above. Patient independent > mod I PTA. Patient currently functioning at an overall mod I level (for increased time). D/C from acute OT services and no additional follow-up OT needs at this time. All appropriate education provided to patient. Please re-order OT if needed.      Follow Up Recommendations  No OT follow up;Supervision - Intermittent    Equipment Recommendations  None recommended by OT    Recommendations for Other Services  None at this time   Precautions / Restrictions Precautions Precautions: None Restrictions Weight Bearing Restrictions: No    Mobility Bed Mobility General bed mobility comments: Pt up in chair upon OT entering room and pt seated EOB upon OT exiting room  Transfers Overall transfer level: Modified independent Equipment used: None Transfers: Sit to/from Stand General transfer comment: Mod I for increased time. No cueing or physical assistance needed.     Balance Overall balance assessment: No apparent balance deficits (not formally assessed)    ADL Overall ADL's : At baseline;Modified independent General ADL Comments: Mod I for increased time    Pertinent Vitals/Pain Pain Assessment: No/denies pain     Hand  Dominance Right   Extremity/Trunk Assessment Upper Extremity Assessment Upper Extremity Assessment: Overall WFL for tasks assessed   Lower Extremity Assessment Lower Extremity Assessment: Overall WFL for tasks assessed   Cervical / Trunk Assessment Cervical / Trunk Assessment: Normal   Communication Communication Communication: HOH   Cognition Arousal/Alertness: Awake/alert Behavior During Therapy: WFL for tasks assessed/performed Overall Cognitive Status: Within Functional Limits for tasks assessed              Home Living Family/patient expects to be discharged to:: Private residence Living Arrangements: Spouse/significant other Available Help at Discharge: Available PRN/intermittently Type of Home: House Home Access: Stairs to enter [employer] of Steps: 3 Entrance Stairs-Rails: Left Home Layout: One level     Bathroom Shower/Tub: Tub/shower unit;Curtain   Biochemist, clinical: Standard     Home Equipment: Grab bars - tub/shower   Additional Comments: Lives with wife who is currently admitted to hospital.  Retired from [employer].      Prior Functioning/Environment Level of Independence: Independent        Comments: drives    OT Diagnosis: Generalized weakness   OT Problem List:  n/a, no acute OT needs identified    OT Treatment/Interventions:   n/a, no acute OT needs identified    OT Goals(Current goals can be found in the care plan section) Acute Rehab OT Goals Patient Stated Goal: go home soon, hopefully today OT Goal Formulation: All assessment and education complete, DC therapy  OT Frequency:   n/a, no acute OT needs identified    Barriers to D/C:  none known at this time    End of Session Activity Tolerance: Patient tolerated treatment well Patient left: in bed;with call bell/phone within reach  Time: 4276-7011 OT Time Calculation (min): 14 min Charges:  OT General Charges $OT Visit: 1 Procedure OT Evaluation $Initial OT  Evaluation Tier I: 1 Procedure G-Codes: OT G-codes **NOT FOR INPATIENT CLASS** Functional Limitation: Self care Self Care Current Status (Y0349): 0 percent impaired, limited or restricted Self Care Goal Status (Y1164): 0 percent impaired, limited or restricted Self Care Discharge Status (H5391): 0 percent impaired, limited or restricted  CLAY,PATRICIA , MS, OTR/L, CLT Pager: [phone]  10/05/2014, 8:57 AM

## 2014-10-05 NOTE — Discharge Summary (Signed)
Physician Discharge Summary  [name] WPY:[id number] DOB: [birth year] DOA: 10/04/2014  PCP: [name], MD  Admit date: 10/04/2014 Discharge date: 10/05/2014  Time spent: 35 minutes  Recommendations for Outpatient Follow-up:  1. Close follow up with cardiology  Discharge Diagnoses:  Principal Problem:   Chest discomfort Active Problems:   Thrombocytopenia   Essential hypertension   Splenomegaly   Other pancytopenia   Hypothyroidism   PAF (paroxysmal atrial fibrillation)   CKD (chronic kidney disease), stage III   Depression   S/P TAVR (transcatheter aortic valve replacement)   Generalized weakness   GERD (gastroesophageal reflux disease)   Chronic diastolic congestive heart failure   Discharge Condition: improved  Diet recommendation: cardiac/carb mod  Filed Weights   10/04/14 0653 10/05/14 0415  Weight: 80.105 kg (176 lb 9.6 oz) 81.194 kg (179 lb)    History of present illness:  [name] is a 81 y.o. male with PMH of chronic pancytopenia/splenomegaly (followed by Dr. 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  Patient reports that he has been having chest discomfort since yesterday. It is located in the substernal area. He states that he has had skipped heartbeat many times. He does not have chest pain or shortness breath. He has mild nausea, but no vomiting, abdominal pain or diarrhea. No cough, fever or chills. He has chronic right shoulder pain, no symptoms of UTI, unilateral weakness. He states that he bruises easily, but no active bleeding, hematochezia or hematuria.  Of note, he is taking aspirin 325 mg daily. Per Char Review, his oncologist, Dr. Gearldine Shown was planning to discuss with his cardiologist, Dr. Burt Knack about the possibility of the discontinuing his ASA due to his easily bruising. His blood pressure was 90/53, which improved to 108/58 after 1 L of normal saline bolus.  In ED, patient was found to have pancytopenia with WBC 1.7 which was 2.0, hemoglobin 9.1 which was 10.3, platelet 30 which was 22 on 09/29/14. Troponin negative, temperature normal, stable renal function, negative chest x-ray. EKG showed atrial fibrillation and prolonged QTc interval at 510. Patient is admitted to inpatient for further evaluate and treatment  Hospital Course:  Chest discomfort and CAD:  Resolved Seen by cards  PAF:  -Patient is not on AC due to pancytopenia and easily bruising.  -Continue amiodarone, propranolol -TSH ok  Aortic stenosis and s/p of AVR  Chronic diastolic congestive heart failure:  Chronic edema -80 mg twice a day tomorrow and then 80 mg in the morning and 40 near evening starting the following day  Chronic pancytopenia/splenomegaly: Patient is a followed up by Dr. Benay Spice. Last seen was 09/29/14. Per his note, this is likely related to a chronic lymphoproliferative disorder or splenic lymphoma. His platelet is stable 22-->30, hemoglobin dropped to slipped slightly from 10.3-->9.1, WBC stable 1.7-->2.0. -follow up with Dr. Benay Spice.  Gout: stable. -Continue allopurinol and colchicine  HTN: Blood pressure was a soft on admission. -Decrease Lasix from 80-40 mg twice a day -Hold amlodipine -Continue propranolol  Hypothyroidism:   -Continue home Synthroid   Protein-calorie malnutrition, severe: -Ensure  CKD (chronic kidney disease), stage III: Stable. His baseline creatinine is 1.6-2.3 recently. His creatinine is 1.84 and a BUN  53 on admission, which is close to baseline. -Follow-up renal function, BMP  Generalized weakness: Most  likely due to deconditioning due to multiple comorbidities. -Nutrition supplement -PT/OT  Depression and anxiety: Stable, no suicidal or homicidal ideations. -Continue home medications: Clonazepam, Zoloft  Procedures:  echo  Consultations:  cards  Discharge Exam: Filed Vitals:   10/05/14 1348  BP: 121/60  Pulse: 77  Temp: 97.5 F (36.4 C)  Resp: 16     Discharge Instructions   Discharge Instructions    Diet - low sodium heart healthy    Complete by:  As directed      Diet Carb Modified    Complete by:  As directed      Discharge instructions    Complete by:  As directed   Home health Close follow up with cardiology- Dr. Burt Knack     Increase activity slowly    Complete by:  As directed           Current Discharge Medication List    START taking these medications   Details  feeding supplement, ENSURE ENLIVE, (ENSURE ENLIVE) LIQD Take 237 mLs by mouth 2 (two) times daily between meals. Qty: 237 mL, Refills: 12      CONTINUE these medications which have NOT CHANGED   Details  allopurinol (ZYLOPRIM) 100 MG tablet Take 1 tablet (100 mg total) by mouth daily. Qty: 90 tablet, Refills: 1    amiodarone (PACERONE) 200 MG tablet TAKE 1 TABLET EVERY DAY Qty: 90 tablet, Refills: 0    Cholecalciferol (VITAMIN D) 1000 UNITS capsule Take 1 capsule (1,000 Units total) by mouth daily. Qty: 90 capsule, Refills: 3    clonazePAM (KLONOPIN) 0.5 MG tablet 1/2 as needed if you wake up early Qty: 30 tablet, Refills: 1   Associated Diagnoses: Sleep disorder    colchicine 0.6 MG tablet Take 0.6 mg by mouth daily as needed (gout flares). Take one tablet by mouth daily during an acute episode of gout.    ferrous sulfate 325 (65 FE) MG tablet Take 325 mg by mouth 2 (two) times daily with a meal.    fish oil-omega-3 fatty acids 1000 MG capsule Take 1 g by mouth daily.      Flaxseed, Linseed, (FLAX SEED OIL) 1000 MG CAPS Take 1 capsule by mouth daily.     furosemide (LASIX) 80  MG tablet TAKE 1 TABLET TWICE DAILY Qty: 180 tablet, Refills: 3    levothyroxine (SYNTHROID, LEVOTHROID) 25 MCG tablet TAKE 1 TABLET EVERY DAY Qty: 90 tablet, Refills: 3    metolazone (ZAROXOLYN) 2.5 MG tablet Take 2.5 mg by mouth daily as needed (sleep). DAILY AS NEEDED FOR EDEMA    Multiple Vitamin (MULTIVITAMIN) tablet Take 1 tablet by mouth daily.      omeprazole (PRILOSEC) 20 MG capsule Take 1 capsule (20 mg total) by mouth daily. Qty: 90 capsule, Refills: 1    potassium chloride (K-DUR) 10 MEQ tablet Take 1 tablet (10 mEq total) by mouth daily. Qty: 90 tablet, Refills: 1    propranolol (INDERAL) 10 MG tablet Take 1 tablet (10 mg total) by mouth 2 (two) times daily. Qty: 180 tablet, Refills: 1    sertraline (ZOLOFT) 50 MG tablet TAKE 1 TABLET (50 MG TOTAL) BY MOUTH DAILY. Qty: 90 tablet, Refills: 0    vitamin C (ASCORBIC ACID) 500 MG tablet Take 1 tablet (500 mg total) by mouth daily. Qty: 90 tablet, Refills: 3      STOP taking these medications  amLODipine (NORVASC) 5 MG tablet      aspirin 325 MG tablet        Allergies  Allergen Reactions  . Penicillins Other (See Comments)    Does not remember reaction (~year 1950)  . Neomycin-Bacitracin Zn-Polymyx Other (See Comments)    ? Reaction (thinks he remembers redness)      The results of significant diagnostics from this hospitalization (including imaging, microbiology, ancillary and laboratory) are listed below for reference.    Significant Diagnostic Studies: Dg Chest 2 View  10/04/2014   CLINICAL DATA:  Chest pain and atrial fibrillation  EXAM: CHEST  2 VIEW  COMPARISON:  05/15/2014  FINDINGS: Chronic cardiomegaly and aortic tortuosity, status post transcatheter aortic valve replacement on top of previous aortic valve replacement.  Low lung volumes. There is no edema, consolidation, effusion, or pneumothorax. No acute osseous finding.  Cholecystectomy.  Known splenomegaly in this patient with cirrhosis.   IMPRESSION: No acute finding.   Electronically Signed   By: Monte Fantasia M.D.   On: 10/04/2014 02:27    Microbiology: No results found for this or any previous visit (from the past 240 hour(s)).   Labs: Basic Metabolic Panel:  Recent Labs Lab 10/04/14 0149 10/04/14 1110 10/05/14 0421  NA 142  --  141  K 3.6  --  4.1  CL 112*  --  109  CO2 20*  --  23  GLUCOSE 114*  --  138*  BUN 53*  --  51*  CREATININE 1.84*  --  1.94*  CALCIUM 8.7*  --  8.7*  MG  --  1.9  --    Liver Function Tests:  Recent Labs Lab 10/05/14 0421  AST 29  ALT 20  ALKPHOS 69  BILITOT 1.1  PROT 5.2*  ALBUMIN 3.2*    Recent Labs Lab 10/04/14 0618  LIPASE 21*   No results for input(s): AMMONIA in the last 168 hours. CBC:  Recent Labs Lab 09/29/14 0947 10/04/14 0149 10/05/14 0421  WBC 2.0* 1.7* 1.5*  NEUTROABS 1.6  --   --   HGB 10.3* 9.1* 9.9*  HCT 32.7* 28.4* 30.6*  MCV 92.6 91.6 90.8  PLT 22* 30* 25*   Cardiac Enzymes:  Recent Labs Lab 10/04/14 0149 10/04/14 0618 10/04/14 1110 10/04/14 1809  TROPONINI 0.03 <0.03 <0.03 <0.03   BNP: BNP (last 3 results)  Recent Labs  05/13/14 1751 10/05/14 0421  BNP 642.8* 887.4*    ProBNP (last 3 results) No results for input(s): PROBNP in the last 8760 hours.  CBG:  Recent Labs Lab 10/04/14 1130 10/04/14 1651 10/05/14 0610 10/05/14 0759  GLUCAP 149* 103* 126* 284*       Signed:  Diogo Anne  [facility] 10/05/2014, 2:47 PM

## 2014-10-05 NOTE — Care Management Note (Signed)
Case Management Note Marvetta Gibbons RN, BSN Unit 2W-Case Manager [phone]  Patient Details  Name: [name] MRN: [id number] Date of Birth: 1922/04/08  Subjective/Objective:      Pt admitted with chest discomfort              Action/Plan: PTA pt lived at home with wife- (who is also admitted to hospital) PT eval ordered recommendation for [facility]- MD please order HH-PT for discharge- [facility] also to see pt for possible f/u at home. NCM to f/u with pt prior to discharge.  Expected Discharge Date:                  Expected Discharge Plan:  [place]  In-House Referral:     Discharge planning Services  CM Consult  Post Acute Care Choice:    Choice offered to:     DME Arranged:    DME Agency:     HH Arranged:    [place] Agency:     Status of Service:  In process, will continue to follow  Medicare Important Message Given:    Date Medicare IM Given:    Medicare IM give by:    Date Additional Medicare IM Given:    Additional Medicare Important Message give by:     If discussed at [employer] of Stay Meetings, dates discussed:    Additional Comments:  Dawayne Patricia, RN 10/05/2014, 11:52 AM

## 2014-10-07 ENCOUNTER — Telehealth: Payer: Self-pay | Admitting: Physician Assistant

## 2014-10-07 NOTE — Telephone Encounter (Signed)
Left message to call back  

## 2014-10-07 NOTE — Telephone Encounter (Signed)
New message      TCM appt on 10-14-14 per [facility].

## 2014-10-08 NOTE — Telephone Encounter (Signed)
LMTCB regarding discharge from hospital

## 2014-10-11 ENCOUNTER — Other Ambulatory Visit (INDEPENDENT_AMBULATORY_CARE_PROVIDER_SITE_OTHER): Payer: Commercial Managed Care - HMO

## 2014-10-11 ENCOUNTER — Telehealth: Payer: Self-pay

## 2014-10-11 ENCOUNTER — Encounter: Payer: Self-pay | Admitting: Internal Medicine

## 2014-10-11 ENCOUNTER — Ambulatory Visit (INDEPENDENT_AMBULATORY_CARE_PROVIDER_SITE_OTHER): Payer: Commercial Managed Care - HMO | Admitting: Internal Medicine

## 2014-10-11 ENCOUNTER — Telehealth: Payer: Self-pay | Admitting: *Deleted

## 2014-10-11 VITALS — BP 122/64 | HR 78 | Temp 97.5°F | Resp 16 | Wt 182.0 lb

## 2014-10-11 DIAGNOSIS — D61810 Antineoplastic chemotherapy induced pancytopenia: Secondary | ICD-10-CM

## 2014-10-11 DIAGNOSIS — M75101 Unspecified rotator cuff tear or rupture of right shoulder, not specified as traumatic: Secondary | ICD-10-CM | POA: Diagnosis not present

## 2014-10-11 DIAGNOSIS — T451X5A Adverse effect of antineoplastic and immunosuppressive drugs, initial encounter: Secondary | ICD-10-CM

## 2014-10-11 DIAGNOSIS — T148XXA Other injury of unspecified body region, initial encounter: Secondary | ICD-10-CM

## 2014-10-11 DIAGNOSIS — T148 Other injury of unspecified body region: Secondary | ICD-10-CM

## 2014-10-11 DIAGNOSIS — G479 Sleep disorder, unspecified: Secondary | ICD-10-CM | POA: Diagnosis not present

## 2014-10-11 LAB — CBC WITH DIFFERENTIAL/PLATELET
BASOS ABS: 0 10*3/uL (ref 0.0–0.1)
BASOS PCT: 0.1 % (ref 0.0–3.0)
EOS ABS: 0 10*3/uL (ref 0.0–0.7)
Eosinophils Relative: 1.4 % (ref 0.0–5.0)
HCT: 32.8 % — ABNORMAL LOW (ref 39.0–52.0)
Hemoglobin: 10.8 g/dL — ABNORMAL LOW (ref 13.0–17.0)
Lymphocytes Relative: 17.7 % (ref 12.0–46.0)
Lymphs Abs: 0.6 10*3/uL — ABNORMAL LOW (ref 0.7–4.0)
MCHC: 32.9 g/dL (ref 30.0–36.0)
MCV: 89.5 fl (ref 78.0–100.0)
MONO ABS: 0.2 10*3/uL (ref 0.1–1.0)
Monocytes Relative: 6.2 % (ref 3.0–12.0)
NEUTROS ABS: 2.5 10*3/uL (ref 1.4–7.7)
NEUTROS PCT: 74.6 % (ref 43.0–77.0)
RBC: 3.66 Mil/uL — ABNORMAL LOW (ref 4.22–5.81)
RDW: 18.1 % — AB (ref 11.5–15.5)
WBC: 3.3 10*3/uL — AB (ref 4.0–10.5)

## 2014-10-11 MED ORDER — CLONAZEPAM 0.5 MG PO TABS: ORAL_TABLET | ORAL | Status: DC

## 2014-10-11 MED ORDER — SERTRALINE HCL 50 MG PO TABS: ORAL_TABLET | ORAL | Status: DC

## 2014-10-11 MED ORDER — TRAMADOL HCL 50 MG PO TABS: 50.0000 mg | ORAL_TABLET | Freq: Three times a day (TID) | ORAL | Status: DC | PRN

## 2014-10-11 NOTE — Progress Notes (Signed)
   Subjective:    Patient ID: [name], male    DOB: 1922/01/28, 81 y.o.   MRN: [id number]  HPI  He noted that the right upper extremity developed diffuse blue hue as of 10/08/14. Actually he had soreness in this area beginning 9/28. He been using an apple peeler/corer on 9/26 and 9/28.  A month ago he developed bruising over the right biceps after using a shotgun to shoot a squirrel.  He tore his right rotator cuff in March of this year throwing wood on a barbecue fire.  He has pancytopenia and had been receiving injections from Dr.Sherrill, Hematologist.  His most recent CBC and differential was 10/05/14. White count was 1500, hematocrit 30.6, and platelet count 25,000.  He is on iron and therefore stools are dark. Other than the profound bruising and difficulty stopping bleeding he denies any other bleeding dyscrasias.  Review of Systems Epistaxis, hemoptysis, hematuria, melena, or rectal bleeding denied. No unexplained weight loss, significant dyspepsia,dysphagia, or abdominal pain.  Chest pain, palpitations, tachycardia, exertional dyspnea, paroxysmal nocturnal dyspnea, claudication or edema are absent.     Objective:   Physical Exam Pertinent or positive findings include: Pattern alopecia is present. He has a small goatee. Abdomen is distended; bowel sounds are active. He he has pitting edema despite support hose. Pedal pulses are decreased. He has profound decreased range of motion of the right shoulder with pain with elevation. The pain is mainly in the right scapular area. There is a faint bruise in this area with swelling medially to the ecchymosis. He has profound ecchymosis which is dark blue over the right biceps area. Above this is a yellowish ecchymotic area. There is chronic dark ecchymotic changes over the forearm bilaterally.  General appearance :adequately nourished; in no distress.  Eyes: No conjunctival inflammation or scleral icterus is present.   Heart:   Normal rate and regular rhythm. S1 and S2 normal without gallop, murmur, click, rub or other extra sounds    Lungs:Chest clear to auscultation; no wheezes, rhonchi,rales ,or rubs present.No increased work of breathing.   Abdomen: bowel sounds normal, no masses or organomegaly noted.   Vascular : all pulses equal ; no bruits present.  Skin:Warm & dry.  Intact without suspicious lesions or rashes ; no tenting or jaundice   Lymphatic: No lymphadenopathy is noted about the head, neck, axilla.   Neuro: Strength, tone decreased.     Assessment & Plan:  #1 rotator cuff tear.Exacerbation of injury 3/16   #2 profound bruising in the context of #3  #3 pancytopenia with platelet count 25,000.  Plan: The risk of profound bleeding with the thrombocytopenia was discussed with him and his daughter. See orders and recommendations

## 2014-10-11 NOTE — Patient Instructions (Signed)
The Orthopedic referral will be scheduled and you'll be notified of the time.Please call the Referral Co-Ordinator @ [phone] if you have not been notified of appointment time within 3 days.  If you take the tramadol for pain; decrease the sertraline from 50 mg daily to one half pill or 25 mg daily that day.  Use an anti-inflammatory cream such as Aspercreme or Zostrix cream twice a day to the affected area as needed. In lieu of this warm moist compresses or  hot water bottle can be used. Do not apply ice .

## 2014-10-11 NOTE — Telephone Encounter (Signed)
This is UP from 25,000

## 2014-10-11 NOTE — Telephone Encounter (Signed)
Tonya in the lab called in critical for pt.   Platelet count is 48

## 2014-10-11 NOTE — Progress Notes (Signed)
Pre visit review using our clinic review tool, if applicable. No additional management support is needed unless otherwise documented below in the visit note. 

## 2014-10-11 NOTE — Telephone Encounter (Signed)
[facility] Night - Client TELEPHONE [facility] Patient Name: [name] Gender: Male DOB: 1922-12-19 Age: 81 Y 94 M 24 D Return Phone Number: [phone] (Primary) Address: [address] City/State/Zip: [place] [address] Client [facility] - Client Client Site [facility] - Night Contact Type Call Call Type Triage / Clinical Relationship To Patient Self Return Phone Number [phone] (Primary) Chief Complaint Arm Injury Initial Comment Caller states his arm started turning blue last night and is black today with blood under the skin, painful - currently has a torn rotator cuff PreDisposition InappropriateToAsk Nurse Assessment Guidelines Guideline Title Affirmed Question Affirmed Notes Nurse Date/Time Eilene Ghazi Time) Shoulder Injury Sounds like a serious injury to the triager Jimmye Norman, RN, [place] 10/09/2014 12:08:01 PM Disp. Time Eilene Ghazi Time) Disposition Final User 10/09/2014 12:07:10 PM Send To RN Personal Dalia Heading 10/09/2014 12:12:01 PM Go to ED Now Yes Jimmye Norman, RN, [place] Fee Caller Understands: Yes Disagree/Comply: Disagree Disagree/Comply Reason: Disagree with instructions Care Advice Given Per Guideline GO TO ED NOW: You need to be seen in the Emergency Department. Go to the ER at ___________ [place] now. Drive carefully. caller requesting appointment, does not want to go to ER After Care Instructions Given Call Event Type User Date / Time Description Referrals GO TO FACILITY REFUSED

## 2014-10-11 NOTE — Telephone Encounter (Signed)
Patient contacted regarding discharge from [facility] on 10/05/14.  Patient understands to follow up with provider Melina Copa on 10/14/14 at 10:30 at [facility].. Patient understands discharge instructions? yes Patient understands medications and regiment? yes Patient understands to bring all medications to this visit? yes  Pt states he is doing good except has had problem with his Rotator cuff.  Called Dr. Clayborn Heron office and they will see him today at 11:30. States his BP is good 150/67 and HR running in low 80's.  States he hasn't gotten the Ensure but is planning on getting that. Reviewed his medications.  Wife on speaker phone states that Amlodipine not on his list to stop.  She thinks his PCP had already stopped the medication. She will check his medications and make sure he doesn't take it. Verbalizes understanding regarding his discharge instructions.

## 2014-10-12 ENCOUNTER — Encounter: Payer: Self-pay | Admitting: *Deleted

## 2014-10-13 ENCOUNTER — Telehealth: Payer: Self-pay | Admitting: *Deleted

## 2014-10-13 NOTE — Telephone Encounter (Signed)
Per Dr. Benay Spice; notified pt that MD wanted to make sure he was not taking his aspirin.  Pt verbalized "no" reports he tore his right rotator cuff when he tried to pull his wife up in the bed; has been in the hospital and "my arm is black/blue from my wrist to my shoulder and my back"  Pt verbalized understanding to call if problems and confirmed appt for 10/27/14.  Dr. Benay Spice made aware.

## 2014-10-14 ENCOUNTER — Ambulatory Visit (INDEPENDENT_AMBULATORY_CARE_PROVIDER_SITE_OTHER): Payer: Commercial Managed Care - HMO | Admitting: Physician Assistant

## 2014-10-14 ENCOUNTER — Encounter: Payer: Self-pay | Admitting: Physician Assistant

## 2014-10-14 VITALS — BP 115/66 | HR 80 | Ht 70.0 in | Wt 182.8 lb

## 2014-10-14 DIAGNOSIS — I4581 Long QT syndrome: Secondary | ICD-10-CM | POA: Diagnosis not present

## 2014-10-14 DIAGNOSIS — I5032 Chronic diastolic (congestive) heart failure: Secondary | ICD-10-CM

## 2014-10-14 DIAGNOSIS — D61818 Other pancytopenia: Secondary | ICD-10-CM

## 2014-10-14 DIAGNOSIS — I959 Hypotension, unspecified: Secondary | ICD-10-CM

## 2014-10-14 DIAGNOSIS — I480 Paroxysmal atrial fibrillation: Secondary | ICD-10-CM

## 2014-10-14 DIAGNOSIS — R9431 Abnormal electrocardiogram [ECG] [EKG]: Secondary | ICD-10-CM | POA: Insufficient documentation

## 2014-10-14 DIAGNOSIS — I359 Nonrheumatic aortic valve disorder, unspecified: Secondary | ICD-10-CM | POA: Diagnosis not present

## 2014-10-14 DIAGNOSIS — Z954 Presence of other heart-valve replacement: Secondary | ICD-10-CM

## 2014-10-14 DIAGNOSIS — Z952 Presence of prosthetic heart valve: Secondary | ICD-10-CM

## 2014-10-14 NOTE — Patient Instructions (Signed)
Medication Instructions:  Your physician recommends that you continue on your current medications as directed. Please refer to the Current Medication list given to you today.   Labwork: None ordered  Testing/Procedures: None ordered  Follow-Up: Your physician recommends that you schedule a follow-up appointment in: 2 months with Dr.Cooper (post TAVR)  Any Other Special Instructions Will Be Listed Below (If Applicable).

## 2014-10-14 NOTE — Progress Notes (Signed)
Cardiology Office Note Date:  10/14/2014  Patient ID:  [name], DOB [birth year], MRN [id number] PCP:  [name], MD  Cardiologist: Dr. Burt Knack  Chief Complaint: f/u hospitalization - afib, hypotension  History of Present Illness: [name] is a 81 y.o. male with history of aortic valve disease (s/p AVR 01/2011 with severe dysfunction leading to TAVR 05/2014), PAF, chronic pancytopenia/splenomegaly (followed by Dr. Benay Spice), possible chronic lymphoproliferative disorder or splenic lymphoma,stroke, CKD stage III, chronic diastolic CHF, hypertension, diet-controlled diabetes, GERD, hypothyroidism, gout, depression who presents for follow-up. He was admitted 9/26-9/27 with chest discomfort, hypotension, and recurrent atrial fibrillation. He received procainamide in the ER and was continued on amiodarone and inderal with good rate control. Given chronic pancytopenia including severe thrombocytopenia, he has not been felt to be a candidate for either aspirin or full-dose anticoagulation - thus a rate control strategy was recommended. Amlodipine was discontinued. Chest discomfort was felt likely not anginal in nature; may have been related to hypotension. Labs otherwise notable for normal TSH, negative troponins, Cr 1.94 at discharge. 2D echo 10/04/14: EF 65-70%, grade 2 DD, elevated LV filling pressures, bioprosthetic seated well, no central AI or perivalvular leak; transaortic gradients are elevated, moderately thickened/calcified mitral leaflets with mild MR, mod TR, mild PR, mild pulm HTN - transaortic gradients were elevated but lowerthan on 06/22/2014.   He comes in to clinic today doing stable from a cardiac standpoint. He's been following BP and pulse at home in a chart he brings in today. Pulse has ranged mostly 70s-80s and BP [id number] systolic. He denies any recurrent CP or SOB. He has chronic unchanged LEE. No syncope. He was recently referred to orthopedics for suspected rotator  cuff tear.   Past Medical History  Diagnosis Date  . Stroke ([place])   . PAF (paroxysmal atrial fibrillation) (H[facility])     a. amiodarone therapy; not felt to be a candidate for anticoagulation with pancytopenia. b. Back in AF 09/2014 - decision was made to pursue rate control only.  . Bradycardia   . Aortic stenosis     a. Previously severe -> s/p minimally invasive tissue AVR with Dr. Evelina Dun at [facility] 01/2011 (pre-AVR cath with no obs CAD);  b. 05/2014 s/p TAVR (23 mm Edwards Sapien 3).  . Hypertension   . Pancytopenia ([place])     a. possible chronic lymphoproliferative disorder or splenic lymphoma.  . Action tremor   . Thrombocytopenia ([place])     Dr. Benay Spice  . Colon polyp   . Anxiety   . Splenomegaly   . Gout   . GERD (gastroesophageal reflux disease)   . Hypothyroidism   . Leukopenia     Chronic pancytopenia  . Degenerative disc disease   . Joint effusion, knee     left knee  . Synovial cyst of popliteal space   . Cellulitis of left leg 10/11-16/2012  . CKD (chronic kidney disease), stage III   . Chronic diastolic CHF (congestive heart failure) ([place])     a. 12/15/2012 TEE: EF 60-65%, no veg.  . Bacteremia     a. 12/2012 - S bovis;  b. 12/2012 TEE w/o veg;  c. Seeing ID->Rocephin therapy extended to 01/25/2013 via PICC for possible endocarditis (No veg on TEE).  . High cholesterol   . Diabetes mellitus     "borderline" (01/05/2013)  . History of blood transfusion 01/2011; 11/2012  . H/O hiatal hernia   . Throat cancer ([place])     s/p lasered  . Diverticulosis   .  S/P TAVR (transcatheter aortic valve replacement)     a. 05/14/2014 TAVR: 23 mm Edwards Sapien 3 transcatheter heart valve placed valve-in-valve for prosthetic valve dysfunction via open right transfemoral approach  . QT prolongation   . Hypotension     Past Surgical History  Procedure Laterality Date  . Cholecystectomy    . Joint effusion      left knee  . Aortic valve replacement  01/23/2011    via minimally invasive  approach per Dr Evelina Dun, [facility]  . Tee without cardioversion N/A 12/15/2012    Procedure: TRANSESOPHAGEAL ECHOCARDIOGRAM (TEE);  Surgeon: [name], MD;  Location: [facility] ENDOSCOPY;  Service: Cardiovascular;  Laterality: N/A;  . Cardiac valve replacement    . Inguinal hernia repair Right   . Excisional hemorrhoidectomy    . Cardiac catheterization    . Surgery scrotal / testicular      "removed one" (01/05/2013)  . 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 Tee without cardioversion N/A 03/26/2014    Procedure: TRANSESOPHAGEAL ECHOCARDIOGRAM (TEE);  Surgeon: Josue Hector, MD;  Location: [facility] ENDOSCOPY;  Service: Cardiovascular;  Laterality: N/A;  . Transcatheter aortic valve replacement, transfemoral Right 05/14/2014    Procedure: TRANSCATHETER AORTIC VALVE REPLACEMENT, TRANSFEMORAL;  Surgeon: [name], MD;  Location: [place];  Service: Open Heart Surgery;  Laterality: Right;  . Tee without cardioversion N/A 05/14/2014    Procedure: TRANSESOPHAGEAL ECHOCARDIOGRAM (TEE);  Surgeon: [name], MD;  Location: [place];  Service: Open Heart Surgery;  Laterality: N/A;    Current Outpatient Prescriptions  Medication Sig Dispense Refill  . allopurinol (ZYLOPRIM) 100 MG tablet Take 1 tablet (100 mg total) by mouth daily. 90 tablet 1  . amiodarone (PACERONE) 200 MG tablet Take 200 mg by mouth daily.    . Cholecalciferol (VITAMIN D) 1000 UNITS capsule Take 1 capsule (1,000 Units total) by mouth daily. 90 capsule 3  . clonazePAM (KLONOPIN) 0.5 MG tablet Take 0.25 mg by mouth as needed (how often is it used and for what).    . colchicine 0.6 MG tablet Take 0.6 mg by mouth daily as needed (gout flares).     . feeding supplement, ENSURE ENLIVE, (ENSURE ENLIVE) LIQD Take 237 mLs by mouth 2 (two) times daily between meals. 237 mL 12  . ferrous sulfate 325 (65 FE)  MG tablet Take 325 mg by mouth 2 (two) times daily with a meal.    . fish oil-omega-3 fatty acids 1000 MG capsule Take 1 g by mouth daily.      . Flaxseed, Linseed, (FLAX SEED OIL) 1000 MG CAPS Take 1 capsule by mouth daily.     . furosemide (LASIX) 80 MG tablet Take 120 mg by mouth daily. Patient taking 80 mg in the morning and 40 mg in the evening    . levothyroxine (SYNTHROID, LEVOTHROID) 25 MCG tablet Take 25 mcg by mouth daily before breakfast.    . metolazone (ZAROXOLYN) 2.5 MG tablet Take 2.5 mg by mouth daily as needed (edema).     . Multiple Vitamin (MULTIVITAMIN) tablet Take 1 tablet by mouth daily.      [name] Kitchen omeprazole (PRILOSEC) 20 MG capsule Take 1 capsule (20 mg total) by mouth daily. 90 capsule 1  . potassium chloride (K-DUR) 10 MEQ tablet Take 1 tablet (10 mEq total) by mouth daily. [place]  tablet 1  . propranolol (INDERAL) 10 MG tablet Take 1 tablet (10 mg total) by mouth 2 (two) times daily. 180 tablet 1  . sertraline (ZOLOFT) 50 MG tablet Take 25 mg by mouth daily.    . traMADol (ULTRAM) 50 MG tablet Take 50 mg by mouth every 8 (eight) hours as needed (pain).    . vitamin C (ASCORBIC ACID) 500 MG tablet Take 1 tablet (500 mg total) by mouth daily. 90 tablet 3   No current facility-administered medications for this visit.    Allergies:   Penicillins and Neomycin-bacitracin zn-polymyx   Social History:  The patient  reports that he quit smoking about 52 years ago. His smoking use included Cigarettes. He has a 18 pack-year smoking history. He has never used smokeless tobacco. He reports that he does not drink alcohol or use illicit drugs.   Family History:  [name] family history includes Cancer in his brother; Colon cancer in his other; Heart disease in his father; Lung cancer in his mother; Stroke in his other.  ROS:  Please see the history of present illness.   All other systems are reviewed and otherwise negative.   PHYSICAL EXAM:  VS:  BP 115/66 mmHg  Pulse 80  Ht 5'  10" (1.778 m)  Wt 182 lb 12.8 oz (82.918 kg)  BMI 26.23 kg/m2 BMI: Body mass index is 26.23 kg/(m^2). Well nourished elderly WM in no acute distress HEENT: normocephalic, atraumatic Neck: no JVD or masses Cardiac:  irregularly irregular rhythm, rate controlled, 2/6 SEM RUSB, no rubs or gallops Lungs:  clear to auscultation bilaterally, no wheezing, rhonchi or rales Abd: soft, nontender, no hepatomegaly, + BS MS: no deformity or atrophy Ext: 1+ BLE edema in hose Skin: warm and dry, upper extremities are ecchymotic bilaterally Neuro:  moves all extremities spontaneously, no focal abnormalities noted, follows commands Psych: euthymic mood, full affect  EKG:  Done today shows atrial fib vs atrial flutter, 80bpm, LVH with QRS widening (184ms), QTc 457ms - stable compared to prior.  Recent Labs: 10/04/2014: Magnesium 1.9; TSH 3.975 10/05/2014: ALT 20; B Natriuretic Peptide 887.4*; BUN 51*; Creatinine, Ser 1.94*; Potassium 4.1; Sodium 141 10/11/2014: Hemoglobin 10.8*; Platelets 48.0 Repeated and verified X2.*  No results found for requested labs within last 365 days.   Estimated Creatinine Clearance: 26.1 mL/min (by C-G formula based on Cr of 1.94).   Wt Readings from Last 3 Encounters:  10/14/14 182 lb 12.8 oz (82.918 kg)  10/11/14 182 lb (82.555 kg)  10/05/14 179 lb (81.194 kg)     Other studies reviewed: Additional studies/records reviewed today include: summarized above  ASSESSMENT AND PLAN:  1. Paroxysmal atrial fib - remains in afib vs flutter but is now rate controlled. He is generally asymptomatic with this. Will continue current regimen. Amiodarone is on board more for rate control than rhythm control at this point (recent issues with softer BP prohibiting titration of more traditional AVN blocking agents). He is not on anticoagulation as above.  2. Hypotension - improved after amlodipine was recently discontinued. He follows his BP diligently at home and will notify us of any  deviant readings. No recurrent chest discomfort. 3. Chronic diastolic CHF - appears euvolemic. Continue current regimen of Lasix. LEE is chronic and unchanged. 4. Prolonged QT - up to 556ms in the hospital, back down to 483ms which is stable from discharge. Some of this may be pushed out 2/2 QRS widening. 5. Aortic valve disease s/p TAVR - followed by Dr. Burt Knack,  who has been conservatively following his gradients for now. 6. Pancytopenia - followed by cancer center.  Disposition: F/u with Dr. Burt Knack in 2 months.   Current medicines are reviewed at length with the patient today.  The patient did not have any concerns regarding medicines.  Raechel Ache PA-C 10/14/2014 10:45 AM     [facility] [place] [address] [phone] (office)  [phone] (fax)

## 2014-10-18 ENCOUNTER — Telehealth: Payer: Self-pay | Admitting: Internal Medicine

## 2014-10-18 ENCOUNTER — Telehealth: Payer: Self-pay | Admitting: Emergency Medicine

## 2014-10-18 NOTE — Telephone Encounter (Signed)
Pt called in stating he is having constant pain in his arm. Pt was instructed per Hopp to continue pain medication, and rest arm. Referral has been ordered and should hear back soon.

## 2014-10-18 NOTE — Telephone Encounter (Signed)
Patient is experiencing complications for condition. Please address referral ASAP.

## 2014-10-18 NOTE — Progress Notes (Signed)
PT evaluation addendum Late entry for Oct 10, 2014 G-codes    10-10-2014 1342  PT Time Calculation  PT Start Time (ACUTE ONLY) 1159  PT Stop Time (ACUTE ONLY) 1224  PT Time Calculation (min) (ACUTE ONLY) 25 min  PT G-Codes **NOT FOR INPATIENT CLASS**  Functional Assessment Tool Used clinical judgement/chart review  Functional Limitation Mobility: Walking and moving around  Mobility: Walking and Moving Around Current Status [phone]) CI  Mobility: Walking and Moving Around Goal Status (B9038) CI  PT General Charges  $$ ACUTE PT VISIT 1 Procedure  PT Evaluation  $Initial PT Evaluation Tier I 1 Procedure  PT Treatments  $Gait Training 8-22 mins  10/18/2014 [place], PT [phone]

## 2014-10-19 NOTE — Telephone Encounter (Signed)
Patient called back regarding this referral.

## 2014-10-19 NOTE — Telephone Encounter (Signed)
error 

## 2014-10-21 NOTE — Telephone Encounter (Signed)
Pt scheduled with Dr. Noemi Chapel on 10/26/14. He is aware.

## 2014-10-27 ENCOUNTER — Telehealth: Payer: Self-pay | Admitting: Nurse Practitioner

## 2014-10-27 ENCOUNTER — Other Ambulatory Visit (HOSPITAL_BASED_OUTPATIENT_CLINIC_OR_DEPARTMENT_OTHER): Payer: Commercial Managed Care - HMO

## 2014-10-27 ENCOUNTER — Ambulatory Visit (HOSPITAL_BASED_OUTPATIENT_CLINIC_OR_DEPARTMENT_OTHER): Payer: Commercial Managed Care - HMO | Admitting: Nurse Practitioner

## 2014-10-27 VITALS — BP 118/69 | HR 77 | Temp 98.4°F | Resp 17 | Ht 70.0 in | Wt 186.5 lb

## 2014-10-27 DIAGNOSIS — D732 Chronic congestive splenomegaly: Secondary | ICD-10-CM

## 2014-10-27 DIAGNOSIS — N183 Chronic kidney disease, stage 3 (moderate): Secondary | ICD-10-CM | POA: Diagnosis not present

## 2014-10-27 DIAGNOSIS — D696 Thrombocytopenia, unspecified: Secondary | ICD-10-CM

## 2014-10-27 DIAGNOSIS — D631 Anemia in chronic kidney disease: Secondary | ICD-10-CM

## 2014-10-27 DIAGNOSIS — D61818 Other pancytopenia: Secondary | ICD-10-CM | POA: Diagnosis not present

## 2014-10-27 LAB — CBC WITH DIFFERENTIAL/PLATELET
BASO%: 0 % (ref 0.0–2.0)
Basophils Absolute: 0 10*3/uL (ref 0.0–0.1)
EOS%: 0.9 % (ref 0.0–7.0)
Eosinophils Absolute: 0 10*3/uL (ref 0.0–0.5)
HCT: 28.4 % — ABNORMAL LOW (ref 38.4–49.9)
HGB: 9 g/dL — ABNORMAL LOW (ref 13.0–17.1)
LYMPH%: 17.2 % (ref 14.0–49.0)
MCH: 28.7 pg (ref 27.2–33.4)
MCHC: 31.7 g/dL — ABNORMAL LOW (ref 32.0–36.0)
MCV: 90.4 fL (ref 79.3–98.0)
MONO#: 0.1 10*3/uL (ref 0.1–0.9)
MONO%: 5.6 % (ref 0.0–14.0)
NEUT#: 1.6 10*3/uL (ref 1.5–6.5)
NEUT%: 76.3 % — ABNORMAL HIGH (ref 39.0–75.0)
Platelets: 26 10*3/uL — ABNORMAL LOW (ref 140–400)
RBC: 3.14 10*6/uL — ABNORMAL LOW (ref 4.20–5.82)
RDW: 17.3 % — ABNORMAL HIGH (ref 11.0–14.6)
WBC: 2.2 10*3/uL — ABNORMAL LOW (ref 4.0–10.3)
lymph#: 0.4 10*3/uL — ABNORMAL LOW (ref 0.9–3.3)
nRBC: 0 % (ref 0–0)

## 2014-10-27 NOTE — Progress Notes (Addendum)
[place] OFFICE PROGRESS NOTE   Diagnosis: Pancytopenia   INTERVAL HISTORY:   [name] returns as scheduled. He was hospitalized last month with chest discomfort, hypotension and recurrent atrial fibrillation.  He continues to note easy bruising. He has a large bruise on the right side/back. He is not aware of any injury. No other bleeding. He denies shortness of breath and chest pain.  He confirms he is no longer taking aspirin.  Objective:  Vital signs in last 24 hours:  Blood pressure 118/69, pulse 77, temperature 98.4 F (36.9 C), temperature source Oral, resp. rate 17, height 5' 10"  (1.778 m), weight 186 lb 8 oz (84.596 kg), SpO2 100 %.    HEENT: No thrush or ulcers. Lymphatics: No palpable cervical or supraclavicular lymph nodes. Resp: Lungs clear. Cardio: Irregular, rate within normal limits. 2/6 systolic murmur. GI: Spleen palpable left abdomen with associated tenderness. Vascular: 1+ edema below the knees bilaterally.  Skin: Ecchymoses scattered over all extremities. Large purple ecchymosis right lateral chest/abdomen extending to the back.    Lab Results:  Lab Results  Component Value Date   WBC 2.2* 10/27/2014   HGB 9.0* 10/27/2014   HCT 28.4* 10/27/2014   MCV 90.4 10/27/2014   PLT 26* 10/27/2014   NEUTROABS 1.6 10/27/2014    Imaging:  No results found.  Medications: I have reviewed the patient's current medications.  Assessment/Plan: 1. Chronic pancytopenia/splenomegaly-likely related to a chronic lymphoproliferative disorder,? Splenic lymphoma   Bone marrow biopsy at [facility] November 2012-slightly hypercellular marrow with trilineage hematopoiesis, interstitial Nieman-Pick like histiocytosis. Negative for dysplasia, negative for lymphoma, negative for increased blasts. 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 History of severe aortic stenosis, status post aortic valve replacement  surgery at [facility] on 01/23/2011; status post transcatheter aortic valve replacement 05/14/2014  3. History of gout  4. Diabetes  5. Streptococcus bacteremia-TEE negative for endocarditis  6. Malaise-likely multifactorial 7. Renal insufficiency  8. Paroxysmal atrial fibrillation  9. Colon polyps noted on a virtual colonoscopy 02/10/2013  10. Anemia secondary to renal insufficiency and the chronic lymphoproliferative disorder. Trial of weekly erythropoietin initiated 04/09/2014 with improvement. The anemia progressed. Erythropoietin resumed 07/02/2014. Discontinued 09/29/2014 secondary to patient not experiencing clinical benefit 11. Right ear skin lesion-possibly a basal cell carcinoma, observed for now   Disposition: [name] has persistent pancytopenia. He is noted to have excessive bruising at today's visit. Dr. Benay Spice discussed resuming Nplate in an attempt to increase the platelet count. Initiation of systemic therapy for treatment of histiocytosis was also discussed. [name] was unable to make a decision at today's visit. He will like to return for a follow-up visit in 2-3 weeks with his wife.  He will return for a follow-up visit on 11/17/2014. He will contact the office in the interim with any problems. We specifically discussed bleeding.  Patient seen with Dr. Benay Spice.    Ned Card ANP/GNP-BC   10/27/2014  11:02 AM  [name] has persistent severe thrombocytopenia. He has chronic splenomegaly and pancytopenia. The etiology of the hematologic findings is unclear. He may have a systemic condition such as histiocytosis or he could have underlying cirrhosis. The pancytopenia has been present for many years. Thrombocytopenia responded partially to nplate therapy in the past. He does not wish to resume this at present. He will remain off of aspirin. He would like to return for further discussion with his wife in 2-3 weeks.  Julieanne Manson, M.D.

## 2014-10-27 NOTE — Telephone Encounter (Signed)
per pof to sch pt appt-gave pt copy of avs °

## 2014-11-17 ENCOUNTER — Other Ambulatory Visit (HOSPITAL_BASED_OUTPATIENT_CLINIC_OR_DEPARTMENT_OTHER): Payer: Commercial Managed Care - HMO

## 2014-11-17 ENCOUNTER — Ambulatory Visit (HOSPITAL_BASED_OUTPATIENT_CLINIC_OR_DEPARTMENT_OTHER): Payer: Commercial Managed Care - HMO | Admitting: Oncology

## 2014-11-17 VITALS — BP 103/77 | HR 86 | Temp 98.5°F | Resp 18 | Ht 70.0 in | Wt 179.0 lb

## 2014-11-17 DIAGNOSIS — D696 Thrombocytopenia, unspecified: Secondary | ICD-10-CM

## 2014-11-17 DIAGNOSIS — N289 Disorder of kidney and ureter, unspecified: Secondary | ICD-10-CM

## 2014-11-17 DIAGNOSIS — D649 Anemia, unspecified: Secondary | ICD-10-CM | POA: Diagnosis not present

## 2014-11-17 DIAGNOSIS — D61818 Other pancytopenia: Secondary | ICD-10-CM

## 2014-11-17 LAB — CBC WITH DIFFERENTIAL/PLATELET
BASO%: 0 % (ref 0.0–2.0)
Basophils Absolute: 0 10*3/uL (ref 0.0–0.1)
EOS%: 0.4 % (ref 0.0–7.0)
Eosinophils Absolute: 0 10*3/uL (ref 0.0–0.5)
HEMATOCRIT: 30.8 % — AB (ref 38.4–49.9)
HGB: 9.9 g/dL — ABNORMAL LOW (ref 13.0–17.1)
LYMPH%: 14.2 % (ref 14.0–49.0)
MCH: 29.2 pg (ref 27.2–33.4)
MCHC: 32.1 g/dL (ref 32.0–36.0)
MCV: 90.9 fL (ref 79.3–98.0)
MONO#: 0.1 10*3/uL (ref 0.1–0.9)
MONO%: 6.2 % (ref 0.0–14.0)
NEUT%: 79.2 % — ABNORMAL HIGH (ref 39.0–75.0)
NEUTROS ABS: 1.8 10*3/uL (ref 1.5–6.5)
NRBC: 0 % (ref 0–0)
Platelets: 19 10*3/uL — ABNORMAL LOW (ref 140–400)
RBC: 3.39 10*6/uL — AB (ref 4.20–5.82)
RDW: 17.5 % — AB (ref 11.0–14.6)
WBC: 2.3 10*3/uL — AB (ref 4.0–10.3)
lymph#: 0.3 10*3/uL — ABNORMAL LOW (ref 0.9–3.3)

## 2014-11-17 NOTE — Progress Notes (Signed)
  [facility] OFFICE PROGRESS NOTE   Diagnosis: Pancytopenia  INTERVAL HISTORY:   [name] returns as scheduled. He is no longer taking aspirin. He denies bleeding. No fever or night sweats. No new complaint. He has been caring for his ill wife.  Objective:  Vital signs in last 24 hours:  Blood pressure 103/77, pulse 86, temperature 98.5 F (36.9 C), temperature source Oral, resp. rate 18, height _0  (1.778 m), weight 179 lb (81.194 kg), SpO2 99 %.    HEENT: Single petechiae at the left gumline and right buccal mucosa Lymphatics: Cervical or supraclavicular, or axillary nodes Resp: Lungs clear bilaterally Cardio: Regular rate and rhythm GI: The spleen is palpable in the left mid abdomen, no hepatomegaly Vascular: Trace edema at the low leg bilaterally with support stockings in place  Skin: Scattered ecchymoses over the extremities    Lab Results:  Lab Results  Component Value Date   WBC 2.3* 11/17/2014   HGB 9.9* 11/17/2014   HCT 30.8* 11/17/2014   MCV 90.9 11/17/2014   PLT 19* 11/17/2014   NEUTROABS 1.8 11/17/2014   12/12/2012-LDH 193  Medications: I have reviewed the patient's current medications.  Assessment/Plan: 1. Chronic pancytopenia/splenomegaly-likely related to a chronic lymphoproliferative disorder,? Splenic lymphoma   Bone marrow biopsy at [facility] November 2012-slightly hypercellular marrow with trilineage hematopoiesis, interstitial Nieman-Pick like histiocytosis. Negative for dysplasia, negative for lymphoma, negative for increased blasts. 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 History of severe aortic stenosis, status post aortic valve replacement surgery at [facility] on 01/23/2011; status post transcatheter aortic valve replacement 05/14/2014  3. History of gout  4. Diabetes  5. Streptococcus bacteremia-TEE negative for endocarditis  6. Malaise-likely multifactorial 7. Renal  insufficiency  8. Paroxysmal atrial fibrillation  9. Colon polyps noted on a virtual colonoscopy 02/10/2013  10. Anemia secondary to renal insufficiency and the chronic lymphoproliferative disorder. Trial of weekly erythropoietin initiated 04/09/2014 with improvement. The anemia progressed. Erythropoietin resumed 07/02/2014. Discontinued 09/29/2014 secondary to patient not experiencing clinical benefit 11. Right ear skin lesion-possibly a basal cell carcinoma, observed for now    Disposition:  [name] appears unchanged. The platelet count has been lower over the past year. He has severe thrombocytopenia. The platelet count responded partially to nplate therapy prior to the aortic valve replacement several years ago.  I suspect he has a long-standing low-grade lymphoproliferative disorder. Bone marrow biopsies have been nondiagnostic. He declines another bone marrow biopsy at present. He does not wish to resume nplate. He may have cirrhosis. There were cirrhotic changes noted on a CT in April of this year.  We discussed a referral to [facility] for another hematology opinion. He cannot travel out of town. He requested a second opinion here. I will ask Dr. Irene Walton to see him.    Walter Coder, MD  11/17/2014  3:26 PM

## 2014-11-22 ENCOUNTER — Telehealth: Payer: Self-pay | Admitting: Oncology

## 2014-11-22 NOTE — Telephone Encounter (Signed)
perpof Dr Benay Spice wants pt to see Dr Irene Limbo as a new patient. Called/sent Tiffany email to follow up with pt to make New patient appt

## 2014-11-22 NOTE — Telephone Encounter (Signed)
OBTAINED RECORDS REQUEST BY PROVIDER AND DELIVERED

## 2014-11-23 ENCOUNTER — Other Ambulatory Visit: Payer: Self-pay | Admitting: Internal Medicine

## 2014-11-23 ENCOUNTER — Telehealth: Payer: Self-pay | Admitting: Hematology

## 2014-11-23 NOTE — Telephone Encounter (Signed)
Left message for patient to return call [phone]

## 2014-11-24 ENCOUNTER — Other Ambulatory Visit: Payer: Self-pay | Admitting: Cardiovascular Disease

## 2014-11-24 ENCOUNTER — Telehealth: Payer: Self-pay | Admitting: Hematology

## 2014-11-24 NOTE — Telephone Encounter (Signed)
New patient appt -s/w patient and gave np appt for 11/28 @ 9:45 w/Dr. Irene Limbo.

## 2014-11-27 ENCOUNTER — Other Ambulatory Visit: Payer: Self-pay | Admitting: Internal Medicine

## 2014-12-06 ENCOUNTER — Ambulatory Visit (HOSPITAL_BASED_OUTPATIENT_CLINIC_OR_DEPARTMENT_OTHER): Payer: Commercial Managed Care - HMO | Admitting: Hematology

## 2014-12-06 ENCOUNTER — Ambulatory Visit (HOSPITAL_BASED_OUTPATIENT_CLINIC_OR_DEPARTMENT_OTHER): Payer: Commercial Managed Care - HMO

## 2014-12-06 ENCOUNTER — Other Ambulatory Visit: Payer: Self-pay | Admitting: Internal Medicine

## 2014-12-06 ENCOUNTER — Other Ambulatory Visit (HOSPITAL_COMMUNITY)
Admission: RE | Admit: 2014-12-06 | Discharge: 2014-12-06 | Disposition: A | Discharge status: Home / Self Care | Payer: Commercial Managed Care - HMO | Source: Ambulatory Visit | Attending: Hematology | Admitting: Hematology

## 2014-12-06 ENCOUNTER — Encounter: Payer: Self-pay | Admitting: Hematology

## 2014-12-06 ENCOUNTER — Telehealth: Payer: Self-pay | Admitting: Hematology

## 2014-12-06 VITALS — BP 119/63 | HR 82 | Temp 97.9°F | Resp 18 | Ht 70.0 in | Wt 183.5 lb

## 2014-12-06 DIAGNOSIS — C8590 Non-Hodgkin lymphoma, unspecified, unspecified site: Secondary | ICD-10-CM | POA: Diagnosis present

## 2014-12-06 DIAGNOSIS — D61818 Other pancytopenia: Secondary | ICD-10-CM

## 2014-12-06 LAB — COMPREHENSIVE METABOLIC PANEL (CC13)
ALT: 21 U/L (ref 0–55)
AST: 27 U/L (ref 5–34)
Albumin: 3.4 g/dL — ABNORMAL LOW (ref 3.5–5.0)
Alkaline Phosphatase: 87 U/L (ref 40–150)
Anion Gap: 8 meq/L (ref 3–11)
BUN: 47.1 mg/dL — ABNORMAL HIGH (ref 7.0–26.0)
CO2: 25 meq/L (ref 22–29)
Calcium: 9.2 mg/dL (ref 8.4–10.4)
Chloride: 112 meq/L — ABNORMAL HIGH (ref 98–109)
Creatinine: 2.2 mg/dL — ABNORMAL HIGH (ref 0.7–1.3)
EGFR: 26 ml/min/1.73 m2 — ABNORMAL LOW
Glucose: 105 mg/dL (ref 70–140)
Potassium: 4.2 meq/L (ref 3.5–5.1)
Sodium: 145 meq/L (ref 136–145)
Total Bilirubin: 0.66 mg/dL (ref 0.20–1.20)
Total Protein: 6.5 g/dL (ref 6.4–8.3)

## 2014-12-06 LAB — CBC & DIFF AND RETIC
BASO%: 0 % (ref 0.0–2.0)
BASOS ABS: 0 10*3/uL (ref 0.0–0.1)
EOS ABS: 0 10*3/uL (ref 0.0–0.5)
EOS%: 0.9 % (ref 0.0–7.0)
HEMATOCRIT: 32.6 % — AB (ref 38.4–49.9)
HEMOGLOBIN: 10.4 g/dL — AB (ref 13.0–17.1)
Immature Retic Fract: 9.7 % (ref 3.00–10.60)
LYMPH#: 0.4 10*3/uL — AB (ref 0.9–3.3)
LYMPH%: 17 % (ref 14.0–49.0)
MCH: 29.1 pg (ref 27.2–33.4)
MCHC: 31.9 g/dL — AB (ref 32.0–36.0)
MCV: 91.1 fL (ref 79.3–98.0)
MONO#: 0.1 10*3/uL (ref 0.1–0.9)
MONO%: 5.7 % (ref 0.0–14.0)
NEUT%: 76.4 % — ABNORMAL HIGH (ref 39.0–75.0)
NEUTROS ABS: 1.8 10*3/uL (ref 1.5–6.5)
Platelets: 31 10*3/uL — ABNORMAL LOW (ref 140–400)
RBC: 3.58 10*6/uL — ABNORMAL LOW (ref 4.20–5.82)
RDW: 17.3 % — AB (ref 11.0–14.6)
RETIC %: 1.42 % (ref 0.80–1.80)
RETIC CT ABS: 50.84 10*3/uL (ref 34.80–93.90)
WBC: 2.3 10*3/uL — ABNORMAL LOW (ref 4.0–10.3)

## 2014-12-06 LAB — CHCC SMEAR

## 2014-12-06 LAB — FERRITIN CHCC: FERRITIN: 104 ng/mL (ref 22–316)

## 2014-12-06 LAB — TECHNOLOGIST REVIEW

## 2014-12-06 NOTE — Progress Notes (Signed)
[name] Kitchen    HEMATOLOGY/ONCOLOGY CONSULTATION NOTE  Date of Service: 12/06/2014  Patient Care Team: Hendricks Limes, MD as PCP - General  CHIEF COMPLAINTS/PURPOSE OF CONSULTATION:  Second opinion regarding Pancytopenia  HISTORY OF PRESENTING ILLNESS:  [name] is a wonderful 81 y.o. male who has been referred to [place] by Dr Learta Codding (his Hematologist) for a secondary opinion regarding his chronic pancytopenia.  Patient is a 81 yo caucasian male with multiple medical co-morbids including PAF (on amiodarone, not on anticoagulation due to thrombocytopenia), aortic stenosis s/p TAVR 05/2014, massive splenomegaly of unclear etiology, hypothyroidism, gout , CKD stage III, diastolic CHF who has been following with Dr Learta Codding for evaluation and management of pancytopenia for several years.  He has add a bone marrow examination in 11/2010 at [facility] for evaluation of his pancytopenia but findings were not definitive of any specific diagnosis. No overt myelofibrosis or MDS. Cytogenetics showed loss of part of Y chromosome is a fraction of cells. FISH panel for MDS unrevealing. There was some concern for a splenic lymphoproliferative process but no defintive evidence for this.  He has been treated with supportive PRBC transfusions and was previously on ESA as well as Nplate (around the time of his aortic surgery) to try to maintain his hgb and platelets respectively. He was seen by De Blanch at [facility] previously and there was also some concern regarding possible ITP. He has had CKD and imaging in 04/2014 showed concern for liver cirrhosis and massive splenomegaly measring 23-24 cms in size.  Patient notes easy skin bruisability and has mutliple purpuric lesions over his extremities. Intermitent minor gum bleed. No other significant overt bleeding.     MEDICAL HISTORY:  Past Medical History  Diagnosis Date  . Stroke ([name])   . PAF (paroxysmal atrial fibrillation) (HCC)     a. amiodarone therapy; not  felt to be a candidate for anticoagulation with pancytopenia. b. Back in AF 09/2014 - decision was made to pursue rate control only.  . Bradycardia   . Aortic stenosis     a. Previously severe -> s/p minimally invasive tissue AVR with Dr. Evelina Dun at [facility] 01/2011 (pre-AVR cath with no obs CAD);  b. 05/2014 s/p TAVR (23 mm Edwards Sapien 3).  . Hypertension   . Pancytopenia ([place])     a. possible chronic lymphoproliferative disorder or splenic lymphoma.  . Action tremor   . Thrombocytopenia ([place])     Dr. Benay Spice  . Colon polyp   . Anxiety   . Splenomegaly   . Gout   . GERD (gastroesophageal reflux disease)   . Hypothyroidism   . Leukopenia     Chronic pancytopenia  . Degenerative disc disease   . Joint effusion, knee     left knee  . Synovial cyst of popliteal space   . Cellulitis of left leg 10/11-16/2012  . CKD (chronic kidney disease), stage III   . Chronic diastolic CHF (congestive heart failure) ([place])     a. 12/15/2012 TEE: EF 60-65%, no veg.  . Bacteremia     a. 12/2012 - S bovis;  b. 12/2012 TEE w/o veg;  c. Seeing ID->Rocephin therapy extended to 01/25/2013 via PICC for possible endocarditis (No veg on TEE).  . High cholesterol   . Diabetes mellitus     "borderline" (01/05/2013)  . History of blood transfusion 01/2011; 11/2012  . H/O hiatal hernia   . Throat cancer ([place])     s/p lasered  . Diverticulosis   . S/P TAVR (  transcatheter aortic valve replacement)     a. 05/14/2014 TAVR: 23 mm Edwards Sapien 3 transcatheter heart valve placed valve-in-valve for prosthetic valve dysfunction via open right transfemoral approach  . QT prolongation   . Hypotension     SURGICAL HISTORY: Past Surgical History  Procedure Laterality Date  . Cholecystectomy    . Joint effusion      left knee  . Aortic valve replacement  01/23/2011    via minimally invasive approach per Dr Evelina Dun, [facility]  . Tee without cardioversion N/A 12/15/2012    Procedure: TRANSESOPHAGEAL ECHOCARDIOGRAM (TEE);   Surgeon: [name], MD;  Location: [facility] ENDOSCOPY;  Service: Cardiovascular;  Laterality: N/A;  . Cardiac valve replacement    . Inguinal hernia repair Right   . Excisional hemorrhoidectomy    . Cardiac catheterization    . Surgery scrotal / testicular      "removed one" (01/05/2013)  . 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 Tee without cardioversion N/A 03/26/2014    Procedure: TRANSESOPHAGEAL ECHOCARDIOGRAM (TEE);  Surgeon: Josue Hector, MD;  Location: [facility] ENDOSCOPY;  Service: Cardiovascular;  Laterality: N/A;  . Transcatheter aortic valve replacement, transfemoral Right 05/14/2014    Procedure: TRANSCATHETER AORTIC VALVE REPLACEMENT, TRANSFEMORAL;  Surgeon: [name], MD;  Location: [facility];  Service: Open Heart Surgery;  Laterality: Right;  . Tee without cardioversion N/A 05/14/2014    Procedure: TRANSESOPHAGEAL ECHOCARDIOGRAM (TEE);  Surgeon: [name], MD;  [facility];  Service: Open Heart Surgery;  Laterality: N/A;    SOCIAL HISTORY: Social History   Social History  . Marital Status: Married    Spouse Name: N/A  . Number of Children: 3  . Years of Education: N/A   Occupational History  . Retired Agricultural consultant    Social History Main Topics  . Smoking status: Former Smoker -- 0.75 packs/day for 24 years    Types: Cigarettes    Quit date: 01/11/1962  . Smokeless tobacco: Never Used  . Alcohol Use: No  . Drug Use: No  . Sexual Activity: No   Other Topics Concern  . Not on file   Social History Narrative   [place]ives at his farm outside of [place]...does some work   Lives with his wife, been married to her for 56 years,   Smoked until 1964 about 7 cigarettes a day for 30 years   No alcohol history.   No drug history                FAMILY HISTORY: Family History  Problem Relation Age of Onset  . Colon cancer Other    . Stroke Other   . Lung cancer Mother   . Heart disease Father   . Cancer Brother     ALLERGIES:  is allergic to penicillins and neomycin-bacitracin zn-polymyx.  MEDICATIONS:  Current Outpatient Prescriptions  Medication Sig Dispense Refill  . allopurinol (ZYLOPRIM) 100 MG tablet Take 1 tablet (100 mg total) by mouth daily. 90 tablet 1  . amiodarone (PACERONE) 200 MG tablet TAKE 1 TABLET EVERY DAY (PLEASE CALL TO SCHEDULE AN APPOINTMENT, [id number]) 90 tablet 0  . Cholecalciferol (VITAMIN D) 1000 UNITS capsule Take 1 capsule (1,000 Units total) by mouth daily. 90 capsule 3  . clonazePAM (KLONOPIN) 0.5 MG tablet Take 0.25 mg by mouth as needed (how often is it  used and for what).    . colchicine 0.6 MG tablet Take 0.6 mg by mouth daily as needed (gout flares).     . feeding supplement, ENSURE ENLIVE, (ENSURE ENLIVE) LIQD Take 237 mLs by mouth 2 (two) times daily between meals. (Patient not taking: Reported on 11/17/2014) 237 mL 12  . ferrous sulfate 325 (65 FE) MG tablet Take 325 mg by mouth 2 (two) times daily with a meal.    . fish oil-omega-3 fatty acids 1000 MG capsule Take 1 g by mouth daily.      . Flaxseed, Linseed, (FLAX SEED OIL) 1000 MG CAPS Take 1 capsule by mouth daily.     . furosemide (LASIX) 80 MG tablet Take 120 mg by mouth daily. Patient taking 80 mg in the morning and 40 mg in the evening    . levothyroxine (SYNTHROID, LEVOTHROID) 25 MCG tablet Take 25 mcg by mouth daily before breakfast.    . metolazone (ZAROXOLYN) 2.5 MG tablet Take 2.5 mg by mouth daily as needed (edema).     . Multiple Vitamin (MULTIVITAMIN) tablet Take 1 tablet by mouth daily.      [name] Kitchen omeprazole (PRILOSEC) 20 MG capsule Take 1 capsule (20 mg total) by mouth daily. 90 capsule 1  . potassium chloride (K-DUR) 10 MEQ tablet Take 1 tablet (10 mEq total) by mouth daily. 90 tablet 1  . propranolol (INDERAL) 10 MG tablet TAKE 1 TABLET TWICE DAILY 180 tablet 0  . sertraline (ZOLOFT) 50 MG tablet Take 1  tablet (50 mg total) by mouth at bedtime. 90 tablet 0  . traMADol (ULTRAM) 50 MG tablet Take 50 mg by mouth every 8 (eight) hours as needed (pain).    . vitamin C (ASCORBIC ACID) 500 MG tablet Take 1 tablet (500 mg total) by mouth daily. 90 tablet 3   No current facility-administered medications for this visit.    REVIEW OF SYSTEMS:    10 Point review of Systems was done is negative except as noted above.  PHYSICAL EXAMINATION: ECOG PERFORMANCE STATUS: 2 - Symptomatic, <50% confined to bed  . Filed Vitals:   12/06/14 1021  BP: 119/63  Pulse: 82  Temp: 97.9 F (36.6 C)  Resp: 18   Filed Weights   12/06/14 1021  Weight: 183 lb 8 oz (83.235 kg)   .Body mass index is 26.33 kg/(m^2).  GENERAL:alert, in no acute distress and comfortable SKIN: skin color, texture, turgor are normal, no rashes or significant lesions EYES: normal, conjunctiva are pink and non-injected, sclera clear OROPHARYNX:no exudate, no erythema and lips, buccal mucosa, and tongue normal  NECK: supple, no JVD, thyroid normal size, non-tender, without nodularity LYMPH:  no palpable lymphadenopathy in the cervical, axillary or inguinal LUNGS: clear to auscultation with normal respiratory effort HEART: regular rate & rhythm,  no murmurs and no lower extremity edema ABDOMEN: abdomen  Distended, massive 4+ splenomegaly with some ttp, no guarding/rigidity , rebound. Musculoskeletal: no cyanosis of digits and no clubbing  PSYCH: alert & oriented x 3 with fluent speech NEURO: no focal motor/sensory deficits  LABORATORY DATA:  I have reviewed the data as listed  . CBC Latest Ref Rng 12/06/2014 11/17/2014 10/27/2014  WBC 4.0 - 10.3 10e3/uL 2.3(L) 2.3(L) 2.2(L)  Hemoglobin 13.0 - 17.1 g/dL 10.4(L) 9.9(L) 9.0(L)  Hematocrit 38.4 - 49.9 % 32.6(L) 30.8(L) 28.4(L)  Platelets 140 - 400 10e3/uL 31(L) 19(L) 26(L)   . CBC    Component Value Date/Time   WBC 2.3* 12/06/2014 1204   WBC 3.3*  10/11/2014 1226   RBC 3.58*  12/06/2014 1204   RBC 3.66* 10/11/2014 1226   RBC 4.08* 12/12/2012 1730   HGB 10.4* 12/06/2014 1204   HGB 10.8* 10/11/2014 1226   HCT 32.6* 12/06/2014 1204   HCT 32.8* 10/11/2014 1226   PLT 31* 12/06/2014 1204   PLT 48.0 Repeated and verified X2.* 10/11/2014 1226   MCV 91.1 12/06/2014 1204   MCV 89.5 10/11/2014 1226   MCH 29.1 12/06/2014 1204   MCH 29.4 10/05/2014 0421   MCHC 31.9* 12/06/2014 1204   MCHC 32.9 10/11/2014 1226   RDW 17.3* 12/06/2014 1204   RDW 18.1* 10/11/2014 1226   LYMPHSABS 0.4* 12/06/2014 1204   LYMPHSABS 0.6* 10/11/2014 1226   MONOABS 0.1 12/06/2014 1204   MONOABS 0.2 10/11/2014 1226   EOSABS 0.0 12/06/2014 1204   EOSABS 0.0 10/11/2014 1226   BASOSABS 0.0 12/06/2014 1204   BASOSABS 0.0 10/11/2014 1226      . CMP Latest Ref Rng 12/06/2014 10/05/2014 10/04/2014  Glucose 70 - 140 mg/dl 105 138(H) 114(H)  BUN 7.0 - 26.0 mg/dL 47.1(H) 51(H) 53(H)  Creatinine 0.7 - 1.3 mg/dL 2.2(H) 1.94(H) 1.84(H)  Sodium 136 - 145 mEq/L 145 141 142  Potassium 3.5 - 5.1 mEq/L 4.2 4.1 3.6  Chloride 101 - 111 mmol/L - 109 112(H)  CO2 22 - 29 mEq/L 25 23 20(L)  Calcium 8.4 - 10.4 mg/dL 9.2 8.7(L) 8.7(L)  Total Protein 6.4 - 8.3 g/dL 6.5 5.2(L) -  Total Bilirubin 0.20 - 1.20 mg/dL 0.66 1.1 -  Alkaline Phos 40 - 150 U/L 87 69 -  AST 5 - 34 U/L 27 29 -  ALT 0 - 55 U/L 21 20 -   . Lab Results  Component Value Date   LDH 193 12/12/2012   . Lab Results  Component Value Date   TOTALPROTELP 5.8* 12/06/2014   ALBUMINELP 3.5* 12/06/2014   A1GS 0.3 12/06/2014   A2GS 0.6 12/06/2014   BETS 0.4 12/06/2014   BETA2SER 0.2 12/06/2014   GAMS 0.8 12/06/2014   SPEI * 12/06/2014   (this displays SPEP labs)  Lab Results  Component Value Date   KPAFRELGTCHN 6.05* 12/06/2014   LAMBDASER 4.69* 12/06/2014   KAPLAMBRATIO 1.29 12/06/2014   (kappa/lambda light chains)  . Lab Results  Component Value Date   IRON 76 07/20/2014   TIBC 316 07/20/2014   IRONPCTSAT 24 07/20/2014    (Iron and TIBC)  Lab Results  Component Value Date   FERRITIN 104 12/06/2014          Component     Latest Ref Rng 12/06/2014  Copper     70 - 175 mcg/dL 100  Vitamin B-12     211 - 911 pg/mL 543     Surgical Pathology (11/20/2010) Surgical Pathology (11/20/2010)  Component Value Ref Range  Clinical History Pancytopenia.81 year old white male history of severe AS, gout hypoparathyroidism and hypertension greater than 30 years.History of pancytopenia with last bone marrow done greater than 20 years ago, now with worsening counts.Rule out MDS.   Gross Examination A. "Bone marrow", labeled with patient's name and history number, two bone marrow aspirate smears and one bone marrow touch preparation are received.  B. "Core", in AZF.Core biopsies of red-brown tissue 0.8 x 0.1 x 0.1 cm in aggregate are submitted in toto as block B1.  C. "Clot", in AZF.A 2.2 x 1.4 x 0.1 cm aggregate of red-brown clot is submitted in toto as block C1.  F. Received is 2 ml bone marrow  aspirate specimen for flow cytometry analysis.   Dr. Anne Hahn. [place]/slides to Dr. Mina Marble   Bone Marrow Differential Count 500 CELL DIFF ReferenceReference Reference  RangeRange Range Segs 9(7-25) Lymphocytes 6 (3-20)Plasma cells0 (0-3.5) Bands 15(6-36) Atypicals 0 RBC Precursors 45 (10-30) Metas8(9-25) Lymphoblasts0 Pronormoblasts0 (0-3) Myelos 9(8-15) Eosinophils 3 (0-4) M:E Ratio1/1 Promyelos3(1-6)Basophils 0 (0-1) IronTR(Scale 0-4+) Myeloblasts0(0-3.5)Monocytes 2 (0-2) Performed By: L.Randon Goldsmith 11/20/10 17:58   Microscopic Examination Automated blood count:Hgb: 11.1, WBC: 1.8, Plt: 54, MCV: 91, RDW: 15.8%.No peripheral blood  smear is available for review.  Bone marrow aspirate:Negative for increased blasts.There are moderately cellular particles.M:E ratio is 1:1.There are many finely vacuolated Niemann-Pick-like cells. These niemann-Pick like histiocytes have abundant vacuolated cytoplasm and small round to oval nuclei.They are mainly scattered, but do form small clusters of two to three cells.Myeloid precursors demonstrate a complete sequence of maturation.Erythroid precursors are moderately increased in number and negative for dysplasia. Megakaryocytes are normal in morphology.  Bone marrow touch preparation:Similar findings are noted.  Prussian blue stained aspirate smear:1 + of 4 stainable iron is present.No ringed sideroblasts are identified.  Bone marrow core biopsy:Four marrow spaces available for evaluation. Cellularity is variable and averages 55%.M:E ratio is estimated at 1:1. There are many Niemann-Pick-like cells with occasional erythrophagocytosis. Negative for increase in blasts. No lymphoid aggregates noted.Myeloid and erythroid precursors demonstrate a complete sequence of maturation and normal localization.Megakaryocytes are present in normal number and show normal morphology.  A reticulin stain of the core biopsy shows no fibrosis.  Aspirate clot section:Similar findings to the core biopsy are noted.     Flow Cytometry Findings Date Collected: 11/12/12Container: F Date Tested:11/12/12Sample type: Bone Marrow  ===SAMPLE COMPOSITION===  !! 11% Lymphoid !69% Myeloid ! ! 6% Monocytic ! % CD3!2% CD19 !16% Erythroid ! % CD4! % kappa !! % CD8! %  lambda !!! ----------------------------------------------------------------------  ! SEE INTERPRETATION  ---------------------------------------------------------------------- INTERPRETATION: There is a mixture of lymphoid, erythroid and maturing myeloid cells. Debris constitutes about 36% of total events which may compromise further analysis. The blasts, identified by their CD45 and CD34 staining and side scatter features, are not increased, constituting less than 1% of the nucleated cells. B-cells comprise approximately 1% of total analyzed events and are polyclonal with respect of kappa/lambda stains.No quantitatively or phenotypically abnormal populations are detected. No previous flow cytometric analysis is available.  Antigens (antibodies) tested: CD10 (L8V5), IE332(95J8), CD19 (SJ25CI), CD33 (P67.6), CD34 (8G12), CD38 (HB7), CD45 (2D1), CD56 (NCAM16.2), CD71 (L01.1), HLA-DR (L243), Kappa (TB282), Lambda (84166), AY301(6W1), CD7(M-T701) Myles.Shearing unique total]  This test was developed and its performance characteristics deter- mined by the Clinical Flow Cytometry laboratory.It has not been cleared or approved by the U.S. Food and Drug Administration.The FDA has determined that such clearance or approval is not necessary. This test is used for clinical purposes.It should not be regarded as investigational or for research.This laboratory is certified under the [place] (CLIA) as qualified to perform high complexity clinical testing.   Diagnosis A-B,F. "BONE MARROW" (ASPIRATE SMEAR, TOUCH PREPARATION, CLOT SECTION, CORE BIOPSY, FLOW CYTOMETRIC IMMUNOPHENOTYPING):  SLIGHTLY HYPERCELLULAR MARROW (55%) FOR AGE. ERYTHROID PREDOMINANT TRILINEAGE HEMATOPOIESIS. A SPECTRUM OF MATURATION IN ALL THREE HEMATOPOIETIC  LINEAGES. INTERSTITIAL HISTIOCYTOSIS WITH NIEMANN-PICK LIKE HISTIOCYTES. NEGATIVE FOR INCREASED BLASTS. NEGATIVE FOR SIGNIFICANT DYSPLASIA. NEGATIVE FOR LYMPHOMA. SEE COMMENT.  COMMENT: It is difficult to determine the cause of pancytopenia in this patient with multiple medical conditions.The Niemann-Pick like histiocytes appear increased, but the degree of histiocytosis could not explain the pancytopenia in this patient.Please correlate  with clinical findings and pending cytogenetics for a complete assessment and for excluding myelodysplastic syndrome. Comment:  I certify that I personally conducted the diagnostic evaluation of the above specimen(s) and have rendered the above diagnosis(es). ENDI WANG,MD,PhD Electronically signed: 11/22/10      Cytogenetics (11/20/2010) Cytogenetics (11/20/2010)  Component Value Ref Range  Indication For Testing Pancytopenia (For FISH analysis, please see [id number].)   Sample Type Bone Marrow   Specimen Data Cells examined/Analyzed: 20/20 Images/Karyotypes: 4/4 Banding: GTW   Approximate Band Resolution 350   Objective Findings Cytogenetic analysis showed a clone with loss of the Y chromosome in 3/20 (15%) of the cells examined. No other clonal abnormalities were observed.   Interpretation 45,X,-Y,[3]/46,XY[17] Male karyotype with loss of the Y chromosome in 15% of cells examined. No other clonal abnormality was detected. Loss of the Y chromosome has been reported as a fairly common finding in older males. However, loss of the Y, particularly in a large proportion of cells, may also behave as or correlate with a neoplastic aberration, and it has been reported in hematologic malignancy. The significance of this finding in this case is not known. Clinical correlation is recommended. For reference please see Larwance Rote and Mitelman, 2009. Cancer  Cytogenetics, Wiley-Blackwell and also Wiktor et al., 2000. Clinical Significance of Y chromosome loss in hematologic disease. Genes Chromosomes and Cancer 27:11-16.  NOTE: For [place] analysis, please see [id number]. Corresponding Pathology Accession: [id number]  This report was edited to correct a punctuation error in the nomenclature. The interpretation remains unchanged. Comment:  REPORT REVISED ON 02/07/11 AT 1572 BY [id number] I certify that I personally conducted the diagnostic evaluation of the above specimen(s) and have rendered the above diagnosis. ENDI CBUL,AG,TXM Electronically signed: 12/06/10    Cytogenetic Director  Earlie Server, Ph.D., [place]  Assistant Director, Clinical Cytogenetics   Doris Cheadle, Ph.D., [facility]  Director, Clinical Cytogenetics    Cytogenetics (11/20/2010)  Specimen Performing Laboratory   SURGICAL PATHOLOGY SERVICES  [address] [place] [address] [place], [place] [address]     Cytogenetics (11/20/2010) Cytogenetics (11/20/2010)  Component Value Ref Range  Indication For Testing Pancytopenia (For chromosome analysis, please see [id number].)   Sample Type Bone marrow FISH   Specimen Data Cells examined/Analyzed: 200 interphase nuclei per probe set by FISH   CG Method Interphase fluorescence in situ hybridization (FISH) analysis was performed using probe sets manufactured by [employer] to detect the following: deletions of 5q31/monosomy 5, deletions of 7q31/monosomy 7, trisomy 8, and deletions of 20q12.  Specifically, for detection of both deletions of 5q31 that include the EGR1 locus and enumeration of chromosome 5, a dual color probe* specific for EGR1 (5q31) and a distal locus on the short arm of chromosome 5 was utilized.For deletions of 7q31 and  enumeration of chromosome 7, a dual color probe* specific for the chromosome 7 centromere (CEP 7) and a locus on the long arm (LSI D7S486) at band 7q31 was used. For deletion of chromosome 20, a probe* specific for the D20S108 locus at band 20q12 was used. Chromosome 8 enumeration was performed with minor protocol modifications using an FDA-approved Chromosome Enumeration Probe** for the centromeric region of chromosome 8 (8p11.1-q11.1).  *These FISH tests were developed and their performance characteristics determined by [facility]. They have not been cleared or approved by the U.S. Food and Drug Administration (FDA). The FDA has determined that such clearance or approval is not necessary. **The performance characteristics of this FDA-approved test were determined by  [facility]. These tests are used for clinical purposes. They should not be regarded as investigational or for research. This laboratory is certified under the [place] (CLIA) as qualified to perform high complexity clinical testing. The FISH results described below should be interpreted within the context of a full cytogenetic analysis and knowledge of the patient's hematologic and immunologic profile.   Objective Findings For the MDS deletion/enumeration assays, signal patterns within normal limits were observed in the cells examined for the following: deletion/monosomy of chromosome 5 in 199/200 (99.5%), for the chromosome 7 centromere and locus D7S486 at 7q31 in 200/200 (100%), for chromosome 8 enumeration in 200/200 (100%), and for deletion at 20q12 in 200/200 (100%). The possibility that an abnormal clone may be present at extremely low levels in this sample cannot be excluded. The ISCN nomenclature for these normal FISH results is shown with interpretation below.   Interpretation No evidence of numeric abnormalities or  deletions involving chromosomes 5, 7, 8 or 20 observed by interphase FISH. Please see the OBJECTIVE FINDINGS section above for details.  nuc ish(D5S721/D5S23,EGR1)x2[199] nuc ish(D7Z1,D7S486)x2[200] nuc ish(D8Z2x2)[200] nuc ish(D20S108x2)[200]  Summary Table: Monosomy 5/5q- Within normal limits Monosomy 7/7q- Within normal limits Trisomy 8Within normal limits Deletion of 20qWithin normal limits  Corresponding Pathology Accession: [id number] Comment:  I certify that I personally conducted the diagnostic evaluation of the above specimen(s) and have rendered the above diagnosis. ENDI XBMW,UX,LKG Electronically signed: 12/07/10    Cytogenetic Director  Earlie Server, Ph.D., [place] Director, Clinical Cytogenetics    Cytogenetics (11/20/2010)  Specimen Performing Laboratory   SURGICAL PATHOLOGY SERVICES  [place] [address] [place], [place] [address]     Peripheral blood smear (reviewed by me)  Normocytic rbc's with aniso-poikilocytosis, rare schistocytes (likely from valve hemolysis), increased tear drop cells and few acanthocytes, decreased platelets, no platelet clumping. No increased blasts. No overt increase in LGL's. No other obvious abnormal lymphoid cells.  RADIOGRAPHIC STUDIES: I have personally reviewed the radiological images as listed and agreed with the findings in the report. No results found.   ASSESSMENT & PLAN:   81 yo caucasian male with   1) Pancytopenia - multiple possible factors but no definitive etiology noted on previous BM Bx at [facility] in 11/2014. -Bone marrow biopsy at [facility] November 2012-slightly hypercellular marrow with trilineage hematopoiesis, interstitial Nieman-Pick like histiocytosis. Negative for dysplasia, negative for lymphoma, negative for increased blasts.  Cytogenetics with loss of chromosome Y in 15% of cells, negative myelodysplasia FISH panel - Patient's massive splenomegaly (about 23 cms) with associated hypersplenism is certainly a significant factors. The primary etiology of the massive splenomegaly has not been determined. He appears to have liver cirrhosis of unclear etiology based on imaging in 04/2014. 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 B12 wnl, LDH WNL MDS/MPN panel results pending. 2) [place] Anemia - from Anemia of chronic disease related to CKD + liver disease + hypersplenism.. Sed rate WNL SPEP unrevealing. 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 PLT today 31k up from 19k  Plan -I discuss his workup and findings in details with the patient and his daughter in clinic. -he and his daughter suggest that his goals ofcare are to try to avoid extensive workup or aggressive treatment. -he is disinclined to have a rpt [place] biopsy at this time. -we discussed that his massive spleen is at his risk of spontaneous or traumatic rupture and that he  needs to be careful regarding abd injuries. -we discussed pros vs cons of possible splenectomy for diagnosis/treatment. He does not want to consider this currently. Also if elective splenectomy is considered would get BM Bx prior to this since presence of Myleofibrosis may be imprtant since if MF is present -splenectomy would lead to hepatic failure from extramed hematopoiesis. -As needed ESA's and Romiplostim to maintain Hgb and platelet counts. -no indications for G-CSF at this time and even if needed would not be available to use given high risk for splenicrupture. (no issues with frequent infections) -if not candidate for surgical splenectomy might consider splenic embolization. -continue f/u with Dr Learta Codding for continued cares  All of the patients questions were answered with apparent satisfaction. The patient knows  to call the clinic with any problems, questions or concerns.  I spent 45 minutes counseling the patient face to face. The total time spent in the appointment was 55 minutes and more than 50% was on counseling and direct patient cares.    Sullivan Lone MD [place] AAHIVMS [facility] Hematology/Oncology Physician [facility]  (Office):       [phone] (Work cell):  [phone] (Fax):           [phone]  12/06/2014 10:28 AM

## 2014-12-06 NOTE — Telephone Encounter (Signed)
MD out of office this week pls advise on refill.../lmb 

## 2014-12-06 NOTE — Telephone Encounter (Signed)
per pof to sch pt appt-sent back to lab °

## 2014-12-07 NOTE — Telephone Encounter (Signed)
Faxed script back to [facility].../lmb 

## 2014-12-08 LAB — SPEP & IFE WITH QIG
Albumin ELP: 3.5 g/dL — ABNORMAL LOW (ref 3.8–4.8)
Alpha-1-Globulin: 0.3 g/dL (ref 0.2–0.3)
Alpha-2-Globulin: 0.6 g/dL (ref 0.5–0.9)
BETA GLOBULIN: 0.4 g/dL (ref 0.4–0.6)
Beta 2: 0.2 g/dL (ref 0.2–0.5)
Gamma Globulin: 0.8 g/dL (ref 0.8–1.7)
IGA: 227 mg/dL (ref 68–379)
IgG (Immunoglobin G), Serum: 675 mg/dL (ref 650–1600)
IgM, Serum: 215 mg/dL (ref 41–251)
Total Protein, Serum Electrophoresis: 5.8 g/dL — ABNORMAL LOW (ref 6.1–8.1)

## 2014-12-08 LAB — KAPPA/LAMBDA LIGHT CHAINS
Kappa free light chain: 6.05 mg/dL — ABNORMAL HIGH (ref 0.33–1.94)
Kappa:Lambda Ratio: 1.29 (ref 0.26–1.65)
Lambda Free Lght Chn: 4.69 mg/dL — ABNORMAL HIGH (ref 0.57–2.63)

## 2014-12-08 LAB — COPPER, SERUM: COPPER: 100 ug/dL (ref 70–175)

## 2014-12-08 LAB — SEDIMENTATION RATE: Sed Rate: 5 mm/hr (ref 0–20)

## 2014-12-08 LAB — VITAMIN B12: VITAMIN B 12: 543 pg/mL (ref 211–911)

## 2014-12-09 LAB — FLOW CYTOMETRY

## 2014-12-22 ENCOUNTER — Telehealth: Payer: Self-pay | Admitting: *Deleted

## 2014-12-22 ENCOUNTER — Encounter: Payer: Self-pay | Admitting: Cardiovascular Disease

## 2014-12-22 ENCOUNTER — Telehealth: Payer: Self-pay | Admitting: Cardiovascular Disease

## 2014-12-22 ENCOUNTER — Ambulatory Visit (INDEPENDENT_AMBULATORY_CARE_PROVIDER_SITE_OTHER): Payer: Commercial Managed Care - HMO | Admitting: Cardiovascular Disease

## 2014-12-22 ENCOUNTER — Other Ambulatory Visit: Payer: Self-pay | Admitting: *Deleted

## 2014-12-22 VITALS — BP 110/64 | HR 83 | Ht 70.0 in | Wt 185.6 lb

## 2014-12-22 DIAGNOSIS — I359 Nonrheumatic aortic valve disorder, unspecified: Secondary | ICD-10-CM

## 2014-12-22 DIAGNOSIS — I481 Persistent atrial fibrillation: Secondary | ICD-10-CM

## 2014-12-22 DIAGNOSIS — I4819 Other persistent atrial fibrillation: Secondary | ICD-10-CM

## 2014-12-22 MED ORDER — METOLAZONE 2.5 MG PO TABS: 2.5000 mg | ORAL_TABLET | Freq: Every day | ORAL | Status: DC | PRN

## 2014-12-22 NOTE — Progress Notes (Signed)
Cardiology Office Note Date:  12/22/2014   ID:  BYRON SICKEL, DOB 08-26-1922, MRN [id number]  PCP:  [name], MD  Cardiologist:  [name], MD    Chief Complaint  Patient presents with  . Shortness of Breath    History of Present Illness: [name] is a 81 y.o. male who presents for  Follow-up evaluation. He has a history of aortic valve disease (s/p AVR 01/2011 with severe bioprosthetic valve dysfunction leading to valve-in-valve TAVR 05/2014), PAF, chronic pancytopenia/splenomegaly (followed by Dr. Benay Spice), possible chronic lymphoproliferative disorder or splenic lymphoma,stroke, CKD stage III, chronic diastolic CHF, hypertension, diet-controlled diabetes, GERD, hypothyroidism, gout, depression who presents for follow-up.   The patient recently has been noted to have worsening pancytopenia. Because of severe thrombocytopenia he has been taken off of all antiplatelet therapy. He continues with close follow-up at the [facility].  He's been having a difficult time lately as his wife of over 86 years has been hospitalized for the last week with multiple problems. From a cardiac perspective, he reports no change in symptoms. Leg swelling is essentially unchanged. He denies orthopnea or PND. He's had no recent chest pain. Exertional dyspnea is unchanged. He does admit to irregular heart beats and palpitations.   Past Medical History  Diagnosis Date  . Stroke ([place])   . PAF (paroxysmal atrial fibrillation) ([facility])     a. amiodarone therapy; not felt to be a candidate for anticoagulation with pancytopenia. b. Back in AF 09/2014 - decision was made to pursue rate control only.  . Bradycardia   . Aortic stenosis     a. Previously severe -> s/p minimally invasive tissue AVR with Dr. Evelina Dun at [facility] 01/2011 (pre-AVR cath with no obs CAD);  b. 05/2014 s/p TAVR (23 mm Edwards Sapien 3).  . Hypertension   . Pancytopenia ([place])     a. possible chronic lymphoproliferative disorder or  splenic lymphoma.  . Action tremor   . Thrombocytopenia ([place])     Dr. Benay Spice  . Colon polyp   . Anxiety   . Splenomegaly   . Gout   . GERD (gastroesophageal reflux disease)   . Hypothyroidism   . Leukopenia     Chronic pancytopenia  . Degenerative disc disease   . Joint effusion, knee     left knee  . Synovial cyst of popliteal space   . Cellulitis of left leg 10/11-16/2012  . CKD (chronic kidney disease), stage III   . Chronic diastolic CHF (congestive heart failure) ([place])     a. 12/15/2012 TEE: EF 60-65%, no veg.  . Bacteremia     a. 12/2012 - S bovis;  b. 12/2012 TEE w/o veg;  c. Seeing ID->Rocephin therapy extended to 01/25/2013 via PICC for possible endocarditis (No veg on TEE).  . High cholesterol   . Diabetes mellitus     "borderline" (01/05/2013)  . History of blood transfusion 01/2011; 11/2012  . H/O hiatal hernia   . Throat cancer ([place])     s/p lasered  . Diverticulosis   . S/P TAVR (transcatheter aortic valve replacement)     a. 05/14/2014 TAVR: 23 mm Edwards Sapien 3 transcatheter heart valve placed valve-in-valve for prosthetic valve dysfunction via open right transfemoral approach  . QT prolongation   . Hypotension     Past Surgical History  Procedure Laterality Date  . Cholecystectomy    . Joint effusion      left knee  . Aortic valve replacement  01/23/2011  via minimally invasive approach per Dr Evelina Dun, [facility]  . Tee without cardioversion N/A 12/15/2012    Procedure: TRANSESOPHAGEAL ECHOCARDIOGRAM (TEE);  Surgeon: [name], MD;  Location: [facility] ENDOSCOPY;  Service: Cardiovascular;  Laterality: N/A;  . Cardiac valve replacement    . Inguinal hernia repair Right   . Excisional hemorrhoidectomy    . Cardiac catheterization    . Surgery scrotal / testicular      "removed one" (01/05/2013)  . 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 Tee without cardioversion N/A 03/26/2014    Procedure: TRANSESOPHAGEAL ECHOCARDIOGRAM (TEE);  Surgeon: Josue Hector, MD;  Location: [facility] ENDOSCOPY;  Service: Cardiovascular;  Laterality: N/A;  . Transcatheter aortic valve replacement, transfemoral Right 05/14/2014    Procedure: TRANSCATHETER AORTIC VALVE REPLACEMENT, TRANSFEMORAL;  Surgeon: [name], MD;  Location: [place];  Service: Open Heart Surgery;  Laterality: Right;  . Tee without cardioversion N/A 05/14/2014    Procedure: TRANSESOPHAGEAL ECHOCARDIOGRAM (TEE);  Surgeon: [name], MD;  Location: [place];  Service: Open Heart Surgery;  Laterality: N/A;    Current Outpatient Prescriptions  Medication Sig Dispense Refill  . allopurinol (ZYLOPRIM) 100 MG tablet TAKE 1 TABLET (100 MG TOTAL) BY MOUTH DAILY. 90 tablet 0  . amiodarone (PACERONE) 200 MG tablet TAKE 1 TABLET EVERY DAY (PLEASE CALL TO SCHEDULE AN APPOINTMENT, [phone]) 90 tablet 0  . Cholecalciferol (VITAMIN D) 1000 UNITS capsule Take 1 capsule (1,000 Units total) by mouth daily. 90 capsule 3  . clonazePAM (KLONOPIN) 0.5 MG tablet TAKE ONE-HALF TABLET BY MOUTH AS NEEDED IF  YOU  WAKE  UP  EARLY 30 tablet 0  . colchicine 0.6 MG tablet Take 0.6 mg by mouth daily as needed (gout flares).     . ferrous sulfate 325 (65 FE) MG tablet Take 325 mg by mouth 2 (two) times daily with a meal.    . fish oil-omega-3 fatty acids 1000 MG capsule Take 1 g by mouth daily.      . Flaxseed, Linseed, (FLAX SEED OIL) 1000 MG CAPS Take 1 capsule by mouth daily.     . furosemide (LASIX) 80 MG tablet Take 120 mg by mouth daily. Patient taking 80 mg in the morning and 40 mg in the evening    . levothyroxine (SYNTHROID, LEVOTHROID) 25 MCG tablet Take 25 mcg by mouth daily before breakfast.    . metolazone (ZAROXOLYN) 2.5 MG tablet Take 2.5 mg by mouth daily as needed (edema).     . Multiple Vitamin (MULTIVITAMIN) tablet Take 1 tablet by mouth daily.      [name] Kitchen omeprazole  (PRILOSEC) 20 MG capsule Take 1 capsule (20 mg total) by mouth daily. 90 capsule 1  . potassium chloride (K-DUR) 10 MEQ tablet Take 1 tablet (10 mEq total) by mouth daily. 90 tablet 1  . propranolol (INDERAL) 10 MG tablet TAKE 1 TABLET TWICE DAILY 180 tablet 0  . sertraline (ZOLOFT) 50 MG tablet Take 1 tablet (50 mg total) by mouth at bedtime. 90 tablet 0  . traMADol (ULTRAM) 50 MG tablet Take 50 mg by mouth every 8 (eight) hours as needed (pain).    . vitamin C (ASCORBIC ACID) 500 MG tablet Take 1 tablet (500 mg total) by mouth daily. 90 tablet 3   No current facility-administered medications for this visit.  Allergies:   Penicillins and Neomycin-bacitracin zn-polymyx   Social History:  The patient  reports that he quit smoking about 52 years ago. His smoking use included Cigarettes. He has a 18 pack-year smoking history. He has never used smokeless tobacco. He reports that he does not drink alcohol or use illicit drugs.   Family History:  [name]  family history includes Cancer in his brother; Colon cancer in his other; Heart disease in his father; Lung cancer in his mother; Stroke in his other.   ROS:  Please see the history of present illness.  Otherwise, review of systems is positive for leg swelling, easy bruising, palpitations.  All other systems are reviewed and negative.    PHYSICAL EXAM: VS:  BP 110/64 mmHg  Pulse 83  Ht 5\' 10"  (1.778 m)  Wt 185 lb 9.6 oz (84.188 kg)  BMI 26.63 kg/m2  SpO2 99% , BMI Body mass index is 26.63 kg/(m^2). GEN: Well nourished, well developed, in no acute distress HEENT: normal Neck: no JVD, no masses. No carotid bruits Cardiac: irregular with 2/6 systolic murmur at the RUSB, no diastolic murmur              Respiratory:  clear to auscultation bilaterally, normal work of breathing GI: soft, nontender, obese, distended MS: no deformity or atrophy Ext: 2+ pretibial edema Skin: warm and dry, no rash Neuro:  Strength and sensation are  intact Psych: euthymic mood, full affect  EKG:  EKG is not ordered today.  Recent Labs: 10/04/2014: Magnesium 1.9; TSH 3.975 10/05/2014: B Natriuretic Peptide 887.4* 12/06/2014: ALT 21; BUN 47.1*; Creatinine 2.2*; HGB 10.4*; Platelets 31*; Potassium 4.2; Sodium 145   Lipid Panel     Component Value Date/Time   CHOL 84 09/21/2013 0809   TRIG 94.0 09/21/2013 0809   HDL 25.60* 09/21/2013 0809   CHOLHDL 3 09/21/2013 0809   VLDL 18.8 09/21/2013 0809   LDLCALC 40 09/21/2013 0809      Wt Readings from Last 3 Encounters:  12/22/14 185 lb 9.6 oz (84.188 kg)  12/06/14 183 lb 8 oz (83.235 kg)  11/17/14 179 lb (81.194 kg)     Cardiac Studies Reviewed: 2D Echo 10-04-2014: Study Conclusions  - Left ventricle: The cavity size was normal. There was moderate concentric hypertrophy. Systolic function was vigorous. The estimated ejection fraction was in the range of 65% to 70%. Wall motion was normal; there were no regional wall motion abnormalities. Features are consistent with a pseudonormal left ventricular filling pattern, with concomitant abnormal relaxation and increased filling pressure (grade 2 diastolic dysfunction). Doppler parameters are consistent with elevated ventricular end-diastolic filling pressure. - Aortic valve: A bioprosthetic valve sits well in the aortic position. There is no central aortic regurgitation and no paravalvular leak. Transaortic gradients are elevated. Mean gradient (S): 32 mm Hg. Peak gradient (S): 56 mm Hg. Valve area (VTI): 1.48 cm^2. Valve area (Vmax): 1.4 cm^2. Valve area (Vmean): 1.57 cm^2. - Aortic root: The aortic root was normal in size. - Mitral valve: Calcified annulus. Moderately thickened, moderately calcified leaflets . There was mild regurgitation. Valve area by continuity equation (using LVOT flow): 2.28 cm^2. - Left atrium: The atrium was moderately dilated. - Right ventricle: The cavity size was normal.  Wall thickness was normal. Systolic function was normal. - Right atrium: The atrium was normal in size. - Tricuspid valve: There was moderate regurgitation. - Pulmonic valve: There was mild regurgitation. - Pulmonary arteries: Systolic pressure was mildly increased. PA peak pressure: 35 mm Hg (  S). - Inferior vena cava: The vessel was normal in size. - Pericardium, extracardiac: There was no pericardial effusion.  Impressions:  - A bioprosthetic Edward-SAPIEN valve sits well in the aortic position. There is no central aortic regurgitation and no paravalvular leak. Transaortic gradients are elevated but lower than on 06/22/2014. Filling pressures reamin elevated. Mild pulmonary hypertension.  ASSESSMENT AND PLAN: 1.   Aortic valve disease status post bioprosthetic aortic valve replacement followed later by valve and valve TAVR to treat bioprosthetic valve dysfunction.  Most recent echo reviewed with stability of valve function with no significant perivalvular AI and moderately elevated gradients. Plan to repeat in approximately 6 months. Continues to follow SBE prophylaxis per guidelines.  2. Acute on chronic diastolic heart failure. The patient has signs of volume overload on exam. His weight is up from baseline. Advised a single dose of metolazone tomorrow morning prior to his furosemide. Sodium restriction stressed with the patient. Things have been tough for him since his wife has been hospitalized.  3. Pancytopenia: continued evaluation with hematology, notes reviewed. Patient no longer appropriate for antiplatelet therapy because of severe thrombocytopenia.  4. Atrial fibrillation: irregular heart rate today, may contribute to CHF symptoms. Not a candidate for cardioversion or anticoagulation.  Current medicines are reviewed with the patient today.  The patient does not have concerns regarding medicines.  Labs/ tests ordered today include:  No orders of the defined  types were placed in this encounter.    Disposition:   FU 3 months  Signed, [name], MD  12/22/2014 12:32 PM    [facility] [address], [place], [place]  [address] Phone: [phone]; Fax: [phone]

## 2014-12-22 NOTE — Telephone Encounter (Signed)
New message       *STAT* If patient is at the pharmacy, call can be transferred to refill team.   1. Which medications need to be refilled? (please list name of each medication and dose if known) metolazone 2.5mg   2. Which pharmacy/location (including street and city if local pharmacy) is medication to be sent to? [facility]  3. Do they need a 30 day or 90 day supply? 30 day

## 2014-12-22 NOTE — Telephone Encounter (Signed)
Call received from patient's daughter Brynda Rim.  Asking for results of eleven vials of blood drawn 12-06-2014.  Dr. Irene Limbo was to consult with Dr. Benay Spice and call.  Asked to speak with [name].  Patient verbalized same.  Currently wife is in the hospital.  Today he see's cardiologist at 1145.  Asked for Dr. Irene Limbo to call home [phone] this afternoon when he returns home if a message can't be left.  His mobile phone isn't working but we can call his daughter Rockie Neighbours mobile number [phone].  Advised he add daughter to people he wants to receive medical information.  Will notify Dr. Irene Limbo and staff of this request.

## 2014-12-22 NOTE — Patient Instructions (Addendum)
Medication Instructions:  Your physician recommends that you continue on your current medications as directed. Please refer to the Current Medication list given to you today.  Please take a METOLAZONE (Zaroxolyn) 2.5mg  30 minutes prior to your morning dosage of Furosemide.   Labwork: No new orders.  Testing/Procedures: No new orders.   Follow-Up: Your physician recommends that you schedule a follow-up appointment in: 3 MONTHS with Dr Burt Knack   Any Other Special Instructions Will Be Listed Below (If Applicable).     If you need a refill on your cardiac medications before your next appointment, please call your pharmacy.

## 2014-12-24 NOTE — Telephone Encounter (Signed)
Spoke with pt's daughter, informed her Dr. Irene Limbo has been trying to reach pt. She provided hospital room phone number to reach him. Message given to Dr. Grier Mitts nurse.

## 2014-12-24 NOTE — Telephone Encounter (Signed)
I called Mr. Barrientos on his home and cell phone number multiple times on 12/23/2014 and 12/24/2014 and left him at least 2 messages to call our clinic back to discuss his lab results but haven't been able to make contact with him or his family yet. No emergently actionable lab data at this time. His lab results and findings were discussed with his primary hematologist Dr. Learta Codding post clinic is working on setting up his follow-up for continued management of pancytopenia.  Sullivan Lone MD [place] Hematology/Oncology Physician [facility]

## 2014-12-27 LAB — MPN-ET/MYELOFIBROSIS (JAK2 V617F-MPL515-CALR)

## 2014-12-28 ENCOUNTER — Other Ambulatory Visit: Payer: Self-pay | Admitting: *Deleted

## 2014-12-28 DIAGNOSIS — D696 Thrombocytopenia, unspecified: Secondary | ICD-10-CM

## 2014-12-29 ENCOUNTER — Encounter: Payer: Self-pay | Admitting: Internal Medicine

## 2014-12-29 ENCOUNTER — Other Ambulatory Visit: Payer: Commercial Managed Care - HMO

## 2014-12-29 ENCOUNTER — Ambulatory Visit (INDEPENDENT_AMBULATORY_CARE_PROVIDER_SITE_OTHER): Payer: Commercial Managed Care - HMO | Admitting: Internal Medicine

## 2014-12-29 ENCOUNTER — Telehealth: Payer: Self-pay | Admitting: Oncology

## 2014-12-29 VITALS — BP 124/68 | HR 71 | Temp 97.6°F | Resp 18 | Wt 184.0 lb

## 2014-12-29 DIAGNOSIS — I5032 Chronic diastolic (congestive) heart failure: Secondary | ICD-10-CM

## 2014-12-29 DIAGNOSIS — Z0000 Encounter for general adult medical examination without abnormal findings: Secondary | ICD-10-CM

## 2014-12-29 DIAGNOSIS — I359 Nonrheumatic aortic valve disorder, unspecified: Secondary | ICD-10-CM | POA: Diagnosis not present

## 2014-12-29 DIAGNOSIS — E039 Hypothyroidism, unspecified: Secondary | ICD-10-CM

## 2014-12-29 DIAGNOSIS — I480 Paroxysmal atrial fibrillation: Secondary | ICD-10-CM

## 2014-12-29 DIAGNOSIS — D61818 Other pancytopenia: Secondary | ICD-10-CM

## 2014-12-29 DIAGNOSIS — E119 Type 2 diabetes mellitus without complications: Secondary | ICD-10-CM

## 2014-12-29 DIAGNOSIS — F32A Depression, unspecified: Secondary | ICD-10-CM

## 2014-12-29 DIAGNOSIS — I10 Essential (primary) hypertension: Secondary | ICD-10-CM

## 2014-12-29 DIAGNOSIS — F329 Major depressive disorder, single episode, unspecified: Secondary | ICD-10-CM

## 2014-12-29 LAB — COMPREHENSIVE METABOLIC PANEL
ALT: 26 U/L (ref 0–53)
AST: 32 U/L (ref 0–37)
Albumin: 3.8 g/dL (ref 3.5–5.2)
Alkaline Phosphatase: 95 U/L (ref 39–117)
BUN: 72 mg/dL — AB (ref 6–23)
CHLORIDE: 104 meq/L (ref 96–112)
CO2: 27 meq/L (ref 19–32)
CREATININE: 2.05 mg/dL — AB (ref 0.40–1.50)
Calcium: 9.3 mg/dL (ref 8.4–10.5)
GFR: 32.53 mL/min — ABNORMAL LOW (ref >60.00)
GLUCOSE: 143 mg/dL — AB (ref 70–99)
Potassium: 3.6 mEq/L (ref 3.5–5.1)
SODIUM: 141 meq/L (ref 135–145)
Total Bilirubin: 0.8 mg/dL (ref 0.2–1.2)
Total Protein: 6.4 g/dL (ref 6.0–8.3)

## 2014-12-29 LAB — HEMOGLOBIN A1C: HEMOGLOBIN A1C: 6.1 % (ref 4.6–6.5)

## 2014-12-29 LAB — TSH: TSH: 3.54 u[IU]/mL (ref 0.35–4.50)

## 2014-12-29 NOTE — Telephone Encounter (Signed)
per po fto sch pt appt-cld pt and gave appt time & date of appt 01/11/15

## 2014-12-29 NOTE — Progress Notes (Signed)
Pre visit review using our clinic review tool, if applicable. No additional management support is needed unless otherwise documented below in the visit note. 

## 2014-12-29 NOTE — Assessment & Plan Note (Signed)
On sertraline 50 mg daily Feels his depression is controlled Continue current dose of zoloft

## 2014-12-29 NOTE — Progress Notes (Signed)
Subjective:    Patient ID: [name], male    DOB: 04-07-1922, 81 y.o.   MRN: [id number]  HPI He is here to establish with a new pcp.   He is here for a wellness visit.   His finger (left index finger) got caught in the door Saturday and he has significant bruising - the finger is black and blue and painful.  The finger tip is numb/tingly.  He thinks it is getting slightly less painful.    Here for medicare wellness.   I have personally reviewed and have noted 1.[name] medical and social history 2.Their use of alcohol, tobacco or illicit drugs 3.Their current medications and supplements 4.[name] functional ability including ADL's, fall risks, home safety risks and hearing or visual impairment. 5.Diet and physical activities 6.Evidence for depression or mood disorders 7.Care team reviewed and updated (available in snapshot)   Are there smokers in your home (other than you)? No  Risk Factors Exercise: none, but active Dietary issues discussed:healthy diet  Cardiac risk factors: advanced age, diabetes, hypertension, hyperlipidemia  Depression Screen  Have you felt down, depressed or hopeless? No  Have you felt little interest or pleasure in doing things?  No Activities of Daily Living In your present state of health, do you have any difficulty performing the following activities?:  Driving? Yes Managing money?  Yes Feeding yourself? Yes Getting from bed to chair? Yes Climbing a flight of stairs? Yes Preparing food and eating?: Yes Bathing or showering? Yes Getting dressed: Yes Getting to/using the toilet? Yes Moving around from place to place: Yes In the past year have you fallen or had a near fall?: Yes  - 2 weeks ago -- fell getting the paper - it was in the ditch, scrapped up his left leg  Are you sexually active?  no    Hearing Difficulties:  Do you often ask people to speak up or  repeat themselves? Yes, has hearing aides Do you experience ringing or noises in your ears? No Do you have difficulty understanding soft or whispered voices? yes Vision:              Any change in vision: No             Up to date with eye exam: yes, Dr. Sherron Flemings Memory:  Do you feel that you have a problem with memory? No  Do you often misplace items? No  Do you feel safe at home?  Yes  Cognitive Testing  Alert, Orientated? Yes  Normal Appearance? Yes  Recall of three objects?  Yes  Can perform simple calculations? Yes  Displays appropriate judgment? Yes  Can read the correct time from a watch face? Yes   Advanced Directives have been discussed with the patient? Yes  - in place  Medications and allergies reviewed with patient and updated if appropriate.  Patient Active Problem List   Diagnosis Date Noted  . QT prolongation   . Generalized weakness 10/04/2014  . Chest discomfort 10/04/2014  . GERD (gastroesophageal reflux disease) 10/04/2014  . Chronic diastolic congestive heart failure ([place]) 10/04/2014  . Impingement syndrome of right shoulder region 05/21/2014  . Severe aortic stenosis 05/14/2014  . S/P TAVR (transcatheter aortic valve replacement) 05/14/2014  . Severe aortic insufficiency 04/05/2014  . Depression 10/02/2013  . Renal insufficiency 08/03/2013  . Cellulitis of leg, left 08/01/2013  . Aortic atherosclerosis ([place]) 01/06/2013  . PAF (paroxysmal atrial fibrillation) ([place]) 01/06/2013  . Lower extremity edema  01/06/2013  . CKD (chronic kidney disease), stage III 01/06/2013  . Bacteremia 12/30/2012  . S/P AVR 12/13/2012  . Protein-calorie malnutrition, severe ([place]) 12/13/2012  . Sinus bradycardia 10/04/2011  . Hypothyroidism 02/27/2011  . Restless legs 02/27/2011  . Other pancytopenia ([place]) 12/06/2009  . HYPERURICEMIA, ASYMPTOMATIC 07/25/2009  . HYPERGLYCEMIA, FASTING 01/05/2009  . CAD, NATIVE VESSEL 12/21/2008  . Diabetes mellitus ([place]) 10/06/2008  . Aortic  valve disorder 05/15/2008  . ACTION TREMOR 01/14/2007  . POPLITEAL CYST, RIGHT 11/05/2006  . Gout 05/13/2006  . Thrombocytopenia ([place]) 05/13/2006  . ANXIETY 05/13/2006  . Essential hypertension 05/13/2006  . CVA 05/13/2006  . [place] DISEASE 05/13/2006  . Splenomegaly 05/13/2006    Current Outpatient Prescriptions on File Prior to Visit  Medication Sig Dispense Refill  . allopurinol (ZYLOPRIM) 100 MG tablet TAKE 1 TABLET (100 MG TOTAL) BY MOUTH DAILY. 90 tablet 0  . amiodarone (PACERONE) 200 MG tablet TAKE 1 TABLET EVERY DAY (PLEASE CALL TO SCHEDULE AN APPOINTMENT, [phone]) 90 tablet 0  . Cholecalciferol (VITAMIN D) 1000 UNITS capsule Take 1 capsule (1,000 Units total) by mouth daily. 90 capsule 3  . clonazePAM (KLONOPIN) 0.5 MG tablet TAKE ONE-HALF TABLET BY MOUTH AS NEEDED IF  YOU  WAKE  UP  EARLY 30 tablet 0  . colchicine 0.6 MG tablet Take 0.6 mg by mouth daily as needed (gout flares).     . ferrous sulfate 325 (65 FE) MG tablet Take 325 mg by mouth 2 (two) times daily with a meal.    . fish oil-omega-3 fatty acids 1000 MG capsule Take 1 g by mouth daily.      . Flaxseed, Linseed, (FLAX SEED OIL) 1000 MG CAPS Take 1 capsule by mouth daily.     . furosemide (LASIX) 80 MG tablet Take 120 mg by mouth daily. Patient taking 80 mg in the morning and 40 mg in the evening    . levothyroxine (SYNTHROID, LEVOTHROID) 25 MCG tablet Take 25 mcg by mouth daily before breakfast.    . metolazone (ZAROXOLYN) 2.5 MG tablet Take 1 tablet (2.5 mg total) by mouth daily as needed (edema). 30 tablet 3  . Multiple Vitamin (MULTIVITAMIN) tablet Take 1 tablet by mouth daily.      [name] Kitchen omeprazole (PRILOSEC) 20 MG capsule Take 1 capsule (20 mg total) by mouth daily. 90 capsule 1  . potassium chloride (K-DUR) 10 MEQ tablet Take 1 tablet (10 mEq total) by mouth daily. 90 tablet 1  . propranolol (INDERAL) 10 MG tablet TAKE 1 TABLET TWICE DAILY 180 tablet 0  . sertraline (ZOLOFT) 50 MG tablet Take 1  tablet (50 mg total) by mouth at bedtime. 90 tablet 0  . traMADol (ULTRAM) 50 MG tablet Take 50 mg by mouth every 8 (eight) hours as needed (pain).    . vitamin C (ASCORBIC ACID) 500 MG tablet Take 1 tablet (500 mg total) by mouth daily. 90 tablet 3   No current facility-administered medications on file prior to visit.    Past Medical History  Diagnosis Date  . Stroke ([place])   . PAF (paroxysmal atrial fibrillation) ([facility])     a. amiodarone therapy; not felt to be a candidate for anticoagulation with pancytopenia. b. Back in AF 09/2014 - decision was made to pursue rate control only.  . Bradycardia   . Aortic stenosis     a. Previously severe -> s/p minimally invasive tissue AVR with Dr. Evelina Dun at [facility] 01/2011 (pre-AVR cath with no obs CAD);  b.  05/2014 s/p TAVR (23 mm Edwards Sapien 3).  . Hypertension   . Pancytopenia ([place])     a. possible chronic lymphoproliferative disorder or splenic lymphoma.  . Action tremor   . Thrombocytopenia ([place])     Dr. Benay Spice  . Colon polyp   . Anxiety   . Splenomegaly   . Gout   . GERD (gastroesophageal reflux disease)   . Hypothyroidism   . Leukopenia     Chronic pancytopenia  . Degenerative disc disease   . Joint effusion, knee     left knee  . Synovial cyst of popliteal space   . Cellulitis of left leg 10/11-16/2012  . CKD (chronic kidney disease), stage III   . Chronic diastolic CHF (congestive heart failure) ([place])     a. 12/15/2012 TEE: EF 60-65%, no veg.  . Bacteremia     a. 12/2012 - S bovis;  b. 12/2012 TEE w/o veg;  c. Seeing ID->Rocephin therapy extended to 01/25/2013 via PICC for possible endocarditis (No veg on TEE).  . High cholesterol   . Diabetes mellitus     "borderline" (01/05/2013)  . History of blood transfusion 01/2011; 11/2012  . H/O hiatal hernia   . Throat cancer ([place])     s/p lasered  . Diverticulosis   . S/P TAVR (transcatheter aortic valve replacement)     a. 05/14/2014 TAVR: 23 mm Edwards Sapien 3 transcatheter heart  valve placed valve-in-valve for prosthetic valve dysfunction via open right transfemoral approach  . QT prolongation   . Hypotension     Past Surgical History  Procedure Laterality Date  . Cholecystectomy    . Joint effusion      left knee  . Aortic valve replacement  01/23/2011    via minimally invasive approach per Dr Evelina Dun, [facility]  . Tee without cardioversion N/A 12/15/2012    Procedure: TRANSESOPHAGEAL ECHOCARDIOGRAM (TEE);  Surgeon: [name], MD;  Location: [facility] ENDOSCOPY;  Service: Cardiovascular;  Laterality: N/A;  . Cardiac valve replacement    . Inguinal hernia repair Right   . Excisional hemorrhoidectomy    . Cardiac catheterization    . Surgery scrotal / testicular      "removed one" (01/05/2013)  . 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 Tee without cardioversion N/A 03/26/2014    Procedure: TRANSESOPHAGEAL ECHOCARDIOGRAM (TEE);  Surgeon: Josue Hector, MD;  Location[facility] ENDOSCOPY;  Service: Cardiovascular;  Laterality: N/A;  . Transcatheter aortic valve replacement, transfemoral Right 05/14/2014    Procedure: TRANSCATHETER AORTIC VALVE REPLACEMENT, TRANSFEMORAL;  Surgeon: [name], MD;  Location: [place];  Service: Open Heart Surgery;  Laterality: Right;  . Tee without cardioversion N/A 05/14/2014    Procedure: TRANSESOPHAGEAL ECHOCARDIOGRAM (TEE);  Surgeon: [name], MD;  Location: [place];  Service: Open Heart Surgery;  Laterality: N/A;    Social History   Social History  . Marital Status: Married    Spouse Name: N/A  . Number of Children: 3  . Years of Education: N/A   Occupational History  . Retired Agricultural consultant    Social History Main Topics  . Smoking status: Former Smoker -- 0.75 packs/day for 24 years    Types: Cigarettes    Quit date: 01/11/1962  . Smokeless tobacco: Never Used  . Alcohol Use: No  .  Drug Use: No  .  Sexual Activity: No   Other Topics Concern  . Not on file   Social History Narrative   Lives at his farm outside of [place]...does some work   Lives with his wife, been married to her for 82 years,   Smoked until 1964 about 7 cigarettes a day for 30 years   No alcohol history.   No drug history                Review of Systems  Constitutional: Negative for fever and chills.  HENT: Positive for hearing loss.   Eyes: Negative for visual disturbance.  Respiratory: Negative for cough, shortness of breath and wheezing.   Cardiovascular: Positive for palpitations (intermittent) and leg swelling (chronic). Negative for chest pain.  Gastrointestinal: Negative for nausea, abdominal pain, diarrhea, constipation and blood in stool.       No GERD  Genitourinary: Positive for frequency.  Musculoskeletal: Positive for arthralgias (right shoulder ). Negative for back pain.  Neurological: Negative for facial asymmetry, light-headedness and headaches.  Psychiatric/Behavioral: Positive for sleep disturbance and dysphoric mood (controlled). The patient is not nervous/anxious.        Objective:   Filed Vitals:   12/29/14 0935  BP: 124/68  Pulse: 71  Temp: 97.6 F (36.4 C)  Resp: 18   Filed Weights   12/29/14 0935  Weight: 184 lb (83.462 kg)   Body mass index is 26.4 kg/(m^2).   Physical Exam  Constitutional: He is oriented to person, place, and time. No distress.  HENT:  Head: Normocephalic and atraumatic.  Right Ear: External ear normal.  Left Ear: External ear normal.  Mouth/Throat: Oropharynx is clear and moist.  Normal ear canals b/l  Eyes: Conjunctivae are normal.  Neck: Neck supple. No JVD present. No tracheal deviation present. No thyromegaly present.  Cardiovascular: Normal rate and regular rhythm.   Murmur (2/6 systolic murmur) heard. Pulmonary/Chest: Effort normal and breath sounds normal. No respiratory distress. He has no wheezes. He has no rales.    Abdominal: Soft. He exhibits distension. There is tenderness (minimal over gallbladder surgery scar, likely hernia;  splenomegaly). There is no rebound and no guarding.  Musculoskeletal: He exhibits edema (3+ pitting edema, wearing compression stockings).  Lymphadenopathy:    He has no cervical adenopathy.  Neurological: He is alert and oriented to person, place, and time.  Skin: He is not diaphoretic.  Chronic bruising/ ecchymosis in arms, left index finger is black and blue, and slightly swollen, slight decreased sensation  Psychiatric:  Slightly depressed affect        Assessment & Plan:   Wellness visit Appropriate screening done and stated above Discussed immunizations he is due for - declined today Already had flu vaccine Eye exam up to date Continue being active Blood work ordered Referral ordered for specialists  See Problem List for A/P of chronic medical problems

## 2014-12-29 NOTE — Patient Instructions (Addendum)
  [name] , Thank you for taking time to come for your Medicare Wellness Visit. I appreciate your ongoing commitment to your health goals. Please review the following plan we discussed and let me know if I can assist you in the future.   These are the goals we discussed: Goals    None      This is a list of the screening recommended for you and due dates:  Health Maintenance  Topic Date Due  . Complete foot exam   03/15/1934  . Eye exam for diabetics  03/15/1934  . Urine Protein Check  03/15/1934  . Tetanus Vaccine  03/15/1943  . Pneumonia vaccines (2 of 2 - PPSV23) 10/13/2014  . Hemoglobin A1C  11/13/2014  . Flu Shot  08/09/2015     We have reviewed your prior records including labs and tests today.  Blood work was ordered, but this will be done with your next routine blood work for one of your specialists.   No immunizations administered today.   Medications reviewed and updated.  No changes recommended at this time.  A referral was ordered for your two specialists.  Please schedule followup in 6 months

## 2014-12-31 NOTE — Assessment & Plan Note (Addendum)
Stable, asymptomatic, chronic leg edema - stable On lasix Following with cardiology

## 2014-12-31 NOTE — Assessment & Plan Note (Signed)
Check a1c Diet controlled Eye exam up to date Continue being active

## 2014-12-31 NOTE — Assessment & Plan Note (Signed)
Following with oncology 

## 2014-12-31 NOTE — Assessment & Plan Note (Signed)
Controlled.  Continue current medications.

## 2014-12-31 NOTE — Assessment & Plan Note (Signed)
Rate controlled, in sinus Not on anticoagulation due to thrombocytopenia

## 2014-12-31 NOTE — Assessment & Plan Note (Signed)
Check tsh Will titrate dose of medication if needed

## 2015-01-07 ENCOUNTER — Telehealth: Payer: Self-pay | Admitting: Oncology

## 2015-01-07 NOTE — Telephone Encounter (Signed)
Returned call to patient dtr re r/s 1/3 appointments. Left message for dtr re new appointment date/time for 1/30 @ 3 pm. Mailed schedule.

## 2015-01-11 ENCOUNTER — Other Ambulatory Visit: Payer: Commercial Managed Care - HMO

## 2015-01-11 ENCOUNTER — Ambulatory Visit: Payer: Commercial Managed Care - HMO | Admitting: Nurse Practitioner

## 2015-01-20 ENCOUNTER — Other Ambulatory Visit: Payer: Self-pay | Admitting: Internal Medicine

## 2015-01-24 ENCOUNTER — Other Ambulatory Visit: Payer: Self-pay | Admitting: Internal Medicine

## 2015-01-24 ENCOUNTER — Other Ambulatory Visit: Payer: Self-pay | Admitting: Cardiovascular Disease

## 2015-02-07 ENCOUNTER — Ambulatory Visit (HOSPITAL_BASED_OUTPATIENT_CLINIC_OR_DEPARTMENT_OTHER): Payer: Commercial Managed Care - HMO | Admitting: Nurse Practitioner

## 2015-02-07 ENCOUNTER — Other Ambulatory Visit (HOSPITAL_BASED_OUTPATIENT_CLINIC_OR_DEPARTMENT_OTHER): Payer: Commercial Managed Care - HMO

## 2015-02-07 ENCOUNTER — Telehealth: Payer: Self-pay | Admitting: Oncology

## 2015-02-07 VITALS — BP 107/68 | HR 82 | Temp 98.2°F | Resp 18 | Ht 70.0 in | Wt 183.0 lb

## 2015-02-07 DIAGNOSIS — D61818 Other pancytopenia: Secondary | ICD-10-CM

## 2015-02-07 DIAGNOSIS — R161 Splenomegaly, not elsewhere classified: Secondary | ICD-10-CM

## 2015-02-07 DIAGNOSIS — D696 Thrombocytopenia, unspecified: Secondary | ICD-10-CM

## 2015-02-07 LAB — CBC WITH DIFFERENTIAL/PLATELET
BASO%: 0.3 % (ref 0.0–2.0)
BASOS ABS: 0 10*3/uL (ref 0.0–0.1)
EOS ABS: 0 10*3/uL (ref 0.0–0.5)
EOS%: 1 % (ref 0.0–7.0)
HCT: 31.3 % — ABNORMAL LOW (ref 38.4–49.9)
HGB: 10.4 g/dL — ABNORMAL LOW (ref 13.0–17.1)
LYMPH%: 14.3 % (ref 14.0–49.0)
MCH: 28.8 pg (ref 27.2–33.4)
MCHC: 33.2 g/dL (ref 32.0–36.0)
MCV: 86.7 fL (ref 79.3–98.0)
MONO#: 0.2 10*3/uL (ref 0.1–0.9)
MONO%: 7.7 % (ref 0.0–14.0)
NEUT%: 76.7 % — ABNORMAL HIGH (ref 39.0–75.0)
NEUTROS ABS: 2.2 10*3/uL (ref 1.5–6.5)
NRBC: 0 % (ref 0–0)
Platelets: 28 10*3/uL — ABNORMAL LOW (ref 140–400)
RBC: 3.61 10*6/uL — ABNORMAL LOW (ref 4.20–5.82)
RDW: 17.7 % — AB (ref 11.0–14.6)
WBC: 2.9 10*3/uL — AB (ref 4.0–10.3)
lymph#: 0.4 10*3/uL — ABNORMAL LOW (ref 0.9–3.3)

## 2015-02-07 NOTE — Telephone Encounter (Signed)
Gave patient dtr avs report and appointments for 3/20 - date per dtr.

## 2015-02-07 NOTE — Progress Notes (Addendum)
  [place] OFFICE PROGRESS NOTE   Diagnosis:  Pancytopenia  INTERVAL HISTORY:   [name] returns as scheduled. He denies any bleeding. No fevers or sweats. He describes his appetite as "fairly well". Weight overall is stable. He takes a diuretic as needed.  Objective:  Vital signs in last 24 hours:  Blood pressure 107/68, pulse 82, temperature 98.2 F (36.8 C), temperature source Oral, resp. rate 18, height '5\' 10"'$  (1.778 m), weight 183 lb (83.008 kg), SpO2 100 %.    HEENT: No thrush or ulcers. Lymphatics: No palpable cervical or supraclavicular lymph nodes. Resp: Lungs clear bilaterally. Cardio: Regular rate and rhythm. GI: Diffuse fullness left upper abdomen. No hepatomegaly. Vascular: Trace edema lower legs bilaterally. Skin: Ecchymoses scattered over extremities.    Lab Results:  Lab Results  Component Value Date   WBC 2.9* 02/07/2015   HGB 10.4* 02/07/2015   HCT 31.3* 02/07/2015   MCV 86.7 02/07/2015   PLT 28* 02/07/2015   NEUTROABS 2.2 02/07/2015    Imaging:  No results found.  Medications: I have reviewed the patient's current medications.  Assessment/Plan: 1. Chronic pancytopenia/splenomegaly-likely related to a chronic lymphoproliferative disorder,? Splenic lymphoma   Bone marrow biopsy at [facility] November 2012-slightly hypercellular marrow with trilineage hematopoiesis, interstitial Nieman-Pick like histiocytosis. Negative for dysplasia, negative for lymphoma, negative for increased blasts. 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 History of severe aortic stenosis, status post aortic valve replacement surgery at [facility] on 01/23/2011; status post transcatheter aortic valve replacement 05/14/2014  3. History of gout  4. Diabetes  5. Streptococcus bacteremia-TEE negative for endocarditis  6. Malaise-likely multifactorial 7. Renal insufficiency  8. Paroxysmal atrial fibrillation  9.  Colon polyps noted on a virtual colonoscopy 02/10/2013  10. Anemia secondary to renal insufficiency and the chronic lymphoproliferative disorder. Trial of weekly erythropoietin initiated 04/09/2014 with improvement. The anemia progressed. Erythropoietin resumed 07/02/2014. Discontinued 09/29/2014 secondary to patient not experiencing clinical benefit 11. Right ear skin lesion-possibly a basal cell carcinoma, observe for now   Disposition: [name] blood counts are unchanged as compared to 2 months ago. The plan is to continue to follow with observation. It was mutually decided to not proceed with a bone marrow biopsy as he has had multiple in the past all of which were nondiagnostic.  He understands he has severe thrombocytopenia and should contact the office with any bleeding.  He will return for a follow-up visit in approximately 6 weeks.  Patient seen with Dr. Benay Spice.    Ned Card ANP/GNP-BC   02/07/2015  3:04 PM  This was a shared visit with Ned Card. [name] was interviewed and examined. He has stable pancytopenia. The hematologic abnormalities date back at least 30 years. I suspect the pancytopenia secondary to liver dysfunction. We discussed the indication for a repeat bone marrow biopsy. [name] does not wish to undergo another bone marrow biopsy. He will contact us for bleeding. He remains off of aspirin.  Julieanne Manson, M.D.

## 2015-03-11 ENCOUNTER — Encounter: Payer: Self-pay | Admitting: Family

## 2015-03-11 ENCOUNTER — Ambulatory Visit (INDEPENDENT_AMBULATORY_CARE_PROVIDER_SITE_OTHER): Payer: Commercial Managed Care - HMO | Admitting: Family

## 2015-03-11 VITALS — BP 130/80 | HR 84 | Temp 97.7°F | Resp 16 | Ht 70.0 in | Wt 186.8 lb

## 2015-03-11 DIAGNOSIS — L03114 Cellulitis of left upper limb: Secondary | ICD-10-CM | POA: Diagnosis not present

## 2015-03-11 MED ORDER — OMEPRAZOLE 20 MG PO CPDR: 20.0000 mg | DELAYED_RELEASE_CAPSULE | Freq: Every day | ORAL | Status: DC

## 2015-03-11 MED ORDER — DOXYCYCLINE HYCLATE 100 MG PO TABS: 100.0000 mg | ORAL_TABLET | Freq: Two times a day (BID) | ORAL | Status: DC

## 2015-03-11 NOTE — Progress Notes (Signed)
Pre visit review using our clinic review tool, if applicable. No additional management support is needed unless otherwise documented below in the visit note. 

## 2015-03-11 NOTE — Progress Notes (Signed)
Subjective:    Patient ID: [name], male    DOB: Dec 02, 1922, 82 y.o.   MRN: [id number]  Chief Complaint  Patient presents with  . Joint Swelling    has swelling in left arm, x1 week     HPI:  [name] is a 82 y.o. male who  has a past medical history of Stroke ([place]); PAF (paroxysmal atrial fibrillation) ([place]); Bradycardia; Aortic stenosis; Hypertension; Pancytopenia ([place]); Action tremor; Thrombocytopenia ([place]); Colon polyp; Anxiety; Splenomegaly; Gout; GERD (gastroesophageal reflux disease); Hypothyroidism; Leukopenia; Degenerative disc disease; Joint effusion, knee; Synovial cyst of popliteal space; Cellulitis of left leg (10/11-16/2012); CKD (chronic kidney disease), stage III; Chronic diastolic CHF (congestive heart failure) ([place]); Bacteremia; High cholesterol; Diabetes mellitus; History of blood transfusion (01/2011; 11/2012); H/O hiatal hernia; Throat cancer ([place]); Diverticulosis; S/P TAVR (transcatheter aortic valve replacement); QT prolongation; and Hypotension. and presents today for an office visit.    This is a new problem. Associated symptom of arm swelling has been going on for about 1 week. Described as swollen around the distal aspect of his arm that moved to the proximal aspect. Initially there was warmth which has since subsided. Modifying factors include soaking in Epson salt and wrapping a cooling pack around it.  Denies numbness or tingling.   Allergies  Allergen Reactions  . Penicillins Other (See Comments)    Does not remember reaction (~year 1950)  . Neomycin-Bacitracin Zn-Polymyx Other (See Comments)    ? Reaction (thinks he remembers redness)     Current Outpatient Prescriptions on File Prior to Visit  Medication Sig Dispense Refill  . allopurinol (ZYLOPRIM) 100 MG tablet TAKE 1 TABLET (100 MG TOTAL) BY MOUTH DAILY. 90 tablet 0  . amiodarone (PACERONE) 200 MG tablet TAKE 1 TABLET EVERY DAY (PLEASE CALL TO SCHEDULE AN APPOINTMENT [phone]) 90  tablet 0  . Cholecalciferol (VITAMIN D) 1000 UNITS capsule Take 1 capsule (1,000 Units total) by mouth daily. 90 capsule 3  . clonazePAM (KLONOPIN) 0.5 MG tablet TAKE ONE-HALF TABLET BY MOUTH AS NEEDED IF  YOU  WAKE  UP  EARLY 30 tablet 0  . colchicine 0.6 MG tablet Take 0.6 mg by mouth daily as needed (gout flares).     . ferrous sulfate 325 (65 FE) MG tablet Take 325 mg by mouth 2 (two) times daily with a meal.    . fish oil-omega-3 fatty acids 1000 MG capsule Take 1 g by mouth daily.      . Flaxseed, Linseed, (FLAX SEED OIL) 1000 MG CAPS Take 1 capsule by mouth daily.     . furosemide (LASIX) 80 MG tablet Take 120 mg by mouth daily. Patient taking 80 mg in the morning and 40 mg in the evening    . levothyroxine (SYNTHROID, LEVOTHROID) 25 MCG tablet Take 25 mcg by mouth daily before breakfast.    . metolazone (ZAROXOLYN) 2.5 MG tablet Take 1 tablet (2.5 mg total) by mouth daily as needed (edema). 30 tablet 3  . Multiple Vitamin (MULTIVITAMIN) tablet Take 1 tablet by mouth daily.      . potassium chloride (K-DUR) 10 MEQ tablet Take 1 tablet (10 mEq total) by mouth daily. 90 tablet 1  . propranolol (INDERAL) 10 MG tablet Take 1 tablet (10 mg total) by mouth 2 (two) times daily. 180 tablet 3  . sertraline (ZOLOFT) 50 MG tablet Take 1 tablet (50 mg total) by mouth at bedtime. 90 tablet 0  . traMADol (ULTRAM) 50 MG tablet Take 50 mg by  mouth every 8 (eight) hours as needed (pain).    . vitamin C (ASCORBIC ACID) 500 MG tablet Take 1 tablet (500 mg total) by mouth daily. 90 tablet 3   No current facility-administered medications on file prior to visit.     Past Surgical History  Procedure Laterality Date  . Cholecystectomy    . Joint effusion      left knee  . Aortic valve replacement  01/23/2011    via minimally invasive approach per Dr Evelina Dun, [facility]  . Tee without cardioversion N/A 12/15/2012    Procedure: TRANSESOPHAGEAL ECHOCARDIOGRAM (TEE);  Surgeon: [name], MD;  Location: [facility]  ENDOSCOPY;  Service: Cardiovascular;  Laterality: N/A;  . Cardiac valve replacement    . Inguinal hernia repair Right   . Excisional hemorrhoidectomy    . Cardiac catheterization    . Surgery scrotal / testicular      "removed one" (01/05/2013)  . 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 Tee without cardioversion N/A 03/26/2014    Procedure: TRANSESOPHAGEAL ECHOCARDIOGRAM (TEE);  Surgeon: Josue Hector, MD;  Location: [facility] ENDOSCOPY;  Service: Cardiovascular;  Laterality: N/A;  . Transcatheter aortic valve replacement, transfemoral Right 05/14/2014    Procedure: TRANSCATHETER AORTIC VALVE REPLACEMENT, TRANSFEMORAL;  Surgeon: [name], MD;  Location: [place];  Service: Open Heart Surgery;  Laterality: Right;  . Tee without cardioversion N/A 05/14/2014    Procedure: TRANSESOPHAGEAL ECHOCARDIOGRAM (TEE);  Surgeon: [name], MD;  Location: [place];  Service: Open Heart Surgery;  Laterality: N/A;     Review of Systems  Constitutional: Negative for fever and chills.  Musculoskeletal: Negative for joint swelling and arthralgias.       Positive for right arm swelling.      Objective:    BP 130/80 mmHg  Pulse 84  Temp(Src) 97.7 F (36.5 C) (Oral)  Resp 16  Ht 5\' 10"  (1.778 m)  Wt 186 lb 12.8 oz (84.732 kg)  BMI 26.80 kg/m2  SpO2 99% Nursing note and vital signs reviewed.  Physical Exam  Constitutional: He is oriented to person, place, and time. He appears well-developed and well-nourished. No distress.  Cardiovascular: Normal rate, regular rhythm, normal heart sounds and intact distal pulses.   Pulmonary/Chest: Effort normal and breath sounds normal.  Neurological: He is alert and oriented to person, place, and time.  Skin: Skin is warm and dry.  Left arm with chronic discoloration and mild warmth with some redness. Pulses and sensation  are intact and appropriate. No discharge.   Psychiatric: He has a normal mood and affect. His behavior is normal. Judgment and thought content normal.       Assessment & Plan:   Problem List Items Addressed This Visit      Other   Cellulitis of left upper extremity - Primary    Symptoms and exam consistent with cellulitis. Low concern for DVT or blood clot. Start doxycycline. Continue elevation and follow-up if symptoms worsen or fail to improve.      Relevant Medications   doxycycline (VIBRA-TABS) 100 MG tablet

## 2015-03-11 NOTE — Assessment & Plan Note (Signed)
Symptoms and exam consistent with cellulitis. Low concern for DVT or blood clot. Start doxycycline. Continue elevation and follow-up if symptoms worsen or fail to improve.

## 2015-03-11 NOTE — Patient Instructions (Signed)
Thank you for choosing [facility].  Summary/Instructions:  Your prescription(s) have been submitted to your pharmacy or been printed and provided for you. Please take as directed and contact our office if you believe you are having problem(s) with the medication(s) or have any questions.  If your symptoms worsen or fail to improve, please contact our office for further instruction, or in case of emergency go directly to the emergency room at the closest medical facility.   Cellulitis Cellulitis is an infection of the skin and the tissue beneath it. The infected area is usually red and tender. Cellulitis occurs most often in the arms and lower legs.  CAUSES  Cellulitis is caused by bacteria that enter the skin through cracks or cuts in the skin. The most common types of bacteria that cause cellulitis are staphylococci and streptococci. SIGNS AND SYMPTOMS   Redness and warmth.  Swelling.  Tenderness or pain.  Fever. DIAGNOSIS  Your health care provider can usually determine what is wrong based on a physical exam. Blood tests may also be done. TREATMENT  Treatment usually involves taking an antibiotic medicine. HOME CARE INSTRUCTIONS   Take your antibiotic medicine as directed by your health care provider. Finish the antibiotic even if you start to feel better.  Keep the infected arm or leg elevated to reduce swelling.  Apply a warm cloth to the affected area up to 4 times per day to relieve pain.  Take medicines only as directed by your health care provider.  Keep all follow-up visits as directed by your health care provider. SEEK MEDICAL CARE IF:   You notice red streaks coming from the infected area.  Your red area gets larger or turns dark in color.  Your bone or joint underneath the infected area becomes painful after the skin has healed.  Your infection returns in the same area or another area.  You notice a swollen bump in the infected area.  You develop  new symptoms.  You have a fever. SEEK IMMEDIATE MEDICAL CARE IF:   You feel very sleepy.  You develop vomiting or diarrhea.  You have a general ill feeling (malaise) with muscle aches and pains.   This information is not intended to replace advice given to you by your health care provider. Make sure you discuss any questions you have with your health care provider.   Document Released: 10/04/2004 Document Revised: 09/15/2014 Document Reviewed: 03/12/2011 Elsevier Interactive Patient Education [employer].

## 2015-03-23 ENCOUNTER — Ambulatory Visit: Payer: Commercial Managed Care - HMO | Admitting: Cardiovascular Disease

## 2015-03-23 ENCOUNTER — Other Ambulatory Visit: Payer: Self-pay | Admitting: Cardiovascular Disease

## 2015-03-23 ENCOUNTER — Other Ambulatory Visit: Payer: Self-pay

## 2015-03-23 MED ORDER — AMIODARONE HCL 200 MG PO TABS: ORAL_TABLET | ORAL | Status: DC

## 2015-03-28 ENCOUNTER — Ambulatory Visit (HOSPITAL_BASED_OUTPATIENT_CLINIC_OR_DEPARTMENT_OTHER): Payer: Commercial Managed Care - HMO | Admitting: Oncology

## 2015-03-28 ENCOUNTER — Other Ambulatory Visit (HOSPITAL_BASED_OUTPATIENT_CLINIC_OR_DEPARTMENT_OTHER): Payer: Commercial Managed Care - HMO

## 2015-03-28 ENCOUNTER — Telehealth: Payer: Self-pay | Admitting: Oncology

## 2015-03-28 VITALS — BP 119/62 | HR 84 | Temp 98.5°F | Resp 18 | Ht 70.0 in | Wt 184.8 lb

## 2015-03-28 DIAGNOSIS — D61818 Other pancytopenia: Secondary | ICD-10-CM | POA: Diagnosis not present

## 2015-03-28 DIAGNOSIS — D696 Thrombocytopenia, unspecified: Secondary | ICD-10-CM

## 2015-03-28 DIAGNOSIS — R161 Splenomegaly, not elsewhere classified: Secondary | ICD-10-CM

## 2015-03-28 LAB — CBC WITH DIFFERENTIAL/PLATELET
BASO%: 0 % (ref 0.0–2.0)
BASOS ABS: 0 10*3/uL (ref 0.0–0.1)
EOS%: 1.4 % (ref 0.0–7.0)
Eosinophils Absolute: 0 10*3/uL (ref 0.0–0.5)
HCT: 27.7 % — ABNORMAL LOW (ref 38.4–49.9)
HGB: 9.2 g/dL — ABNORMAL LOW (ref 13.0–17.1)
LYMPH%: 14.4 % (ref 14.0–49.0)
MCH: 28.8 pg (ref 27.2–33.4)
MCHC: 33.2 g/dL (ref 32.0–36.0)
MCV: 86.8 fL (ref 79.3–98.0)
MONO#: 0.2 10*3/uL (ref 0.1–0.9)
MONO%: 10.2 % (ref 0.0–14.0)
NEUT#: 1.6 10*3/uL (ref 1.5–6.5)
NEUT%: 74 % (ref 39.0–75.0)
NRBC: 0 % (ref 0–0)
Platelets: 27 10*3/uL — ABNORMAL LOW (ref 140–400)
RBC: 3.19 10*6/uL — AB (ref 4.20–5.82)
RDW: 17.7 % — AB (ref 11.0–14.6)
WBC: 2.2 10*3/uL — ABNORMAL LOW (ref 4.0–10.3)
lymph#: 0.3 10*3/uL — ABNORMAL LOW (ref 0.9–3.3)

## 2015-03-28 LAB — TECHNOLOGIST REVIEW

## 2015-03-28 NOTE — Progress Notes (Signed)
  [name] OFFICE PROGRESS NOTE   Diagnosis: Pancytopenia  INTERVAL HISTORY:  [name] returns as scheduled. He spent the month of February in [place]. He reports easy bruising. No other bleeding. He developed swelling of the left arm when he returns from [place] and was seen by his primary physician 03/11/2015. He was treated for left upper extremity cellulitis. The swelling has resolved.   Objective:  Vital signs in last 24 hours:  Blood pressure 119/62, pulse 84, temperature 98.5 F (36.9 C), temperature source Oral, resp. rate 18, height _0  (1.778 m), weight 184 lb 12.8 oz (83.825 kg), SpO2 100 %.    HEENT: A few petechiae at the buccal mucosa, no thrush Lymph nodes: No cervical, supraclavicular, or axillar nodes Resp: Lungs clear bilaterally Cardio: Irregular GI: No hepatomegaly, the spleen is palpable in the left mid abdomen with associated tenderness Vascular: Trace low leg edema bilaterally , no arm edema Skin: Gray/purple discoloration diffusely over the arms, scattered ecchymoses over the legs   Lab Results:  Lab Results  Component Value Date   WBC 2.2* 03/28/2015   HGB 9.2* 03/28/2015   HCT 27.7* 03/28/2015   MCV 86.8 03/28/2015   PLT 27* 03/28/2015   NEUTROABS 1.6 03/28/2015     Medications: I have reviewed the patient's current medications.  Assessment/Plan: 1. Chronic pancytopenia/splenomegaly-likely related to a chronic lymphoproliferative disorder,? Splenic lymphoma   Bone marrow biopsy at [facility] November 2012-slightly hypercellular marrow with trilineage hematopoiesis, interstitial Nieman-Pick like histiocytosis. Negative for dysplasia, negative for lymphoma, negative for increased blasts. 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 History of severe aortic stenosis, status post aortic valve replacement surgery at [facility] on 01/23/2011; status post transcatheter aortic valve replacement  05/14/2014  3. History of gout  4. Diabetes  5. Streptococcus bacteremia-TEE negative for endocarditis  6. Malaise-likely multifactorial 7. Renal insufficiency  8. Paroxysmal atrial fibrillation  9. Colon polyps noted on a virtual colonoscopy 02/10/2013  10. Anemia secondary to renal insufficiency and the chronic lymphoproliferative disorder. Trial of weekly erythropoietin initiated 04/09/2014 with improvement. The anemia progressed. Erythropoietin resumed 07/02/2014. Discontinued 09/29/2014 secondary to patient not experiencing clinical benefit 11. Right ear skin lesion-possibly a basal cell carcinoma, observe for now   Disposition:  [name] appears stable. He will contact us for spontaneous bleeding. [name] will return for an office visit and CBC in approximately one month. He does not wish to undergo a repeat bone marrow biopsy.  Betsy Coder, MD  03/28/2015  3:17 PM

## 2015-03-28 NOTE — Telephone Encounter (Signed)
per pof to sch pt appt-gave pt copy of avs °

## 2015-03-29 ENCOUNTER — Other Ambulatory Visit: Payer: Self-pay | Admitting: Internal Medicine

## 2015-03-31 ENCOUNTER — Other Ambulatory Visit: Payer: Self-pay | Admitting: Internal Medicine

## 2015-03-31 ENCOUNTER — Telehealth: Payer: Self-pay | Admitting: Oncology

## 2015-03-31 NOTE — Telephone Encounter (Signed)
pt called to r/s appt...done....pt ok and aware °

## 2015-04-21 ENCOUNTER — Other Ambulatory Visit: Payer: Self-pay

## 2015-04-21 DIAGNOSIS — Z952 Presence of prosthetic heart valve: Secondary | ICD-10-CM

## 2015-04-21 DIAGNOSIS — I350 Nonrheumatic aortic (valve) stenosis: Secondary | ICD-10-CM

## 2015-05-02 ENCOUNTER — Ambulatory Visit: Payer: Commercial Managed Care - HMO | Admitting: Nurse Practitioner

## 2015-05-02 ENCOUNTER — Other Ambulatory Visit: Payer: Commercial Managed Care - HMO

## 2015-05-03 ENCOUNTER — Telehealth: Payer: Self-pay | Admitting: Oncology

## 2015-05-03 NOTE — Telephone Encounter (Signed)
Faxed records to [facility] [phone] release id)

## 2015-05-09 ENCOUNTER — Other Ambulatory Visit: Payer: Self-pay | Admitting: Internal Medicine

## 2015-05-09 ENCOUNTER — Telehealth: Payer: Self-pay | Admitting: Emergency Medicine

## 2015-05-09 ENCOUNTER — Ambulatory Visit (INDEPENDENT_AMBULATORY_CARE_PROVIDER_SITE_OTHER)
Admission: RE | Admit: 2015-05-09 | Discharge: 2015-05-09 | Disposition: A | Discharge status: Home / Self Care | Payer: Commercial Managed Care - HMO | Source: Ambulatory Visit | Attending: Internal Medicine | Admitting: Internal Medicine

## 2015-05-09 ENCOUNTER — Ambulatory Visit (INDEPENDENT_AMBULATORY_CARE_PROVIDER_SITE_OTHER): Payer: Commercial Managed Care - HMO | Admitting: Internal Medicine

## 2015-05-09 ENCOUNTER — Encounter: Payer: Self-pay | Admitting: Internal Medicine

## 2015-05-09 ENCOUNTER — Other Ambulatory Visit (INDEPENDENT_AMBULATORY_CARE_PROVIDER_SITE_OTHER): Payer: Commercial Managed Care - HMO

## 2015-05-09 VITALS — BP 106/60 | HR 91 | Temp 98.2°F | Resp 18 | Wt 190.0 lb

## 2015-05-09 DIAGNOSIS — A419 Sepsis, unspecified organism: Secondary | ICD-10-CM | POA: Insufficient documentation

## 2015-05-09 DIAGNOSIS — IMO0002 Reserved for concepts with insufficient information to code with codable children: Secondary | ICD-10-CM

## 2015-05-09 DIAGNOSIS — N183 Chronic kidney disease, stage 3 unspecified: Secondary | ICD-10-CM

## 2015-05-09 DIAGNOSIS — R161 Splenomegaly, not elsewhere classified: Secondary | ICD-10-CM

## 2015-05-09 DIAGNOSIS — T148 Other injury of unspecified body region: Secondary | ICD-10-CM

## 2015-05-09 DIAGNOSIS — D696 Thrombocytopenia, unspecified: Secondary | ICD-10-CM

## 2015-05-09 DIAGNOSIS — E8770 Fluid overload, unspecified: Secondary | ICD-10-CM | POA: Diagnosis not present

## 2015-05-09 LAB — COMPREHENSIVE METABOLIC PANEL
ALT: 29 U/L (ref 0–53)
AST: 31 U/L (ref 0–37)
Albumin: 3.5 g/dL (ref 3.5–5.2)
Alkaline Phosphatase: 96 U/L (ref 39–117)
BUN: 55 mg/dL — ABNORMAL HIGH (ref 6–23)
CALCIUM: 8.5 mg/dL (ref 8.4–10.5)
CHLORIDE: 103 meq/L (ref 96–112)
CO2: 26 meq/L (ref 19–32)
CREATININE: 1.95 mg/dL — AB (ref 0.40–1.50)
GFR: 34.43 mL/min — AB (ref >60.00)
Glucose, Bld: 98 mg/dL (ref 70–99)
Potassium: 4.2 mEq/L (ref 3.5–5.1)
Sodium: 136 mEq/L (ref 135–145)
Total Bilirubin: 0.8 mg/dL (ref 0.2–1.2)
Total Protein: 5.8 g/dL — ABNORMAL LOW (ref 6.0–8.3)

## 2015-05-09 LAB — CBC WITH DIFFERENTIAL/PLATELET
BASOS PCT: 0.7 % (ref 0.0–3.0)
Basophils Absolute: 0.1 10*3/uL (ref 0.0–0.1)
EOS PCT: 1.5 % (ref 0.0–5.0)
Eosinophils Absolute: 0.1 10*3/uL (ref 0.0–0.7)
HEMATOCRIT: 32.1 % — AB (ref 39.0–52.0)
Hemoglobin: 10.6 g/dL — ABNORMAL LOW (ref 13.0–17.0)
LYMPHS PCT: 9.9 % — AB (ref 12.0–46.0)
Lymphs Abs: 0.7 10*3/uL (ref 0.7–4.0)
MCHC: 33 g/dL (ref 30.0–36.0)
MCV: 88.1 fl (ref 78.0–100.0)
MONOS PCT: 3.4 % (ref 3.0–12.0)
Monocytes Absolute: 0.3 10*3/uL (ref 0.1–1.0)
NEUTROS ABS: 6.3 10*3/uL (ref 1.4–7.7)
Neutrophils Relative %: 84.5 % — ABNORMAL HIGH (ref 43.0–77.0)
RBC: 3.64 Mil/uL — ABNORMAL LOW (ref 4.22–5.81)
RDW: 18.8 % — ABNORMAL HIGH (ref 11.5–15.5)
WBC: 7.4 10*3/uL (ref 4.0–10.5)

## 2015-05-09 LAB — HEMOGLOBIN A1C: Hgb A1c MFr Bld: 5.7 % (ref 4.6–6.5)

## 2015-05-09 LAB — BRAIN NATRIURETIC PEPTIDE: Pro B Natriuretic peptide (BNP): 410 pg/mL — ABNORMAL HIGH (ref 0.0–100.0)

## 2015-05-09 MED ORDER — MUPIROCIN 2 % EX OINT: 1.0000 "application " | TOPICAL_OINTMENT | Freq: Two times a day (BID) | CUTANEOUS | Status: DC

## 2015-05-09 NOTE — Assessment & Plan Note (Signed)
Check cmp given fluid overload and increased use of zaroxolyn If kidney function will advised zaroxolyn plus lasix for three days and then stop zaroxolyn Check kidney function again this week

## 2015-05-09 NOTE — Progress Notes (Signed)
Pre visit review using our clinic review tool, if applicable. No additional management support is needed unless otherwise documented below in the visit note. 

## 2015-05-09 NOTE — Assessment & Plan Note (Signed)
Severe fluid overload likely related to excessive IVF while hospitalized with sepsis Agree with zaroxolyn plus lasix but need to monitor kidney function Check cmp today - if ok advised taking both for three days then stop zaroxolyn Check cmp at end of week

## 2015-05-09 NOTE — Telephone Encounter (Signed)
See result note.  

## 2015-05-09 NOTE — Assessment & Plan Note (Signed)
[place]ospitalized [place] for one week Due to pyelonephritis Treated with antibiotics Sepsis resolved. No urinary symptoms

## 2015-05-09 NOTE — Assessment & Plan Note (Signed)
Chronic, following with oncology

## 2015-05-09 NOTE — Assessment & Plan Note (Signed)
chronic mild splenomegaly [place] in [place] 04/2015 showed a benign appearing hemangioma in spleen

## 2015-05-09 NOTE — Progress Notes (Signed)
Subjective:    Patient ID: [name], male    DOB: Oct 21, 1922, 82 y.o.   MRN: [id number]  HPI  He is here for an acute visit for cough.  He was in the hospital in [place] for 7 days and was discharged last 5 days ago. He has sepsis due to pyelonephritis.  He was treated with IVF and antibiotics.  He received excessive fluids.  His whole body is swollen.  He has taken the zaroxolyn twice since being home and has lost 10 lbs in the past two days.    He denies any urinary symptoms since being discharge.   Cough:  He has a primarily dry cough that is rarely productive.  It started in the hospital.  It has persisted.  He does have some pain in the anterior right chest with breathing.  His wife states wheezing.  The cough is worse when he lays down.   He denies fever/chills.    Blister in right abdomen:  While in the hospital he developed a blister on the right side of his abdomen.  It is painful and he was concerned about shingles.  He did pop and is less painful.  He has been applying antibiotic ointment.  He denies pus.  He is unsure how he got it.   Painful area on his right ear:  For years he has had a painful area in his right ear.  It gets inflamed at times.  He has had dicharge at one point a long time ago. It does not seem to go away.    Medications and allergies reviewed with patient and updated if appropriate.  Patient Active Problem List   Diagnosis Date Noted  . Cellulitis of left upper extremity 03/11/2015  . QT prolongation   . Generalized weakness 10/04/2014  . Chest discomfort 10/04/2014  . GERD (gastroesophageal reflux disease) 10/04/2014  . Chronic diastolic congestive heart failure ([place]) 10/04/2014  . Impingement syndrome of right shoulder region 05/21/2014  . Severe aortic stenosis 05/14/2014  . S/P TAVR (transcatheter aortic valve replacement) 05/14/2014  . Severe aortic insufficiency 04/05/2014  . Depression 10/02/2013  . Renal insufficiency 08/03/2013  .  Cellulitis of leg, left 08/01/2013  . Aortic atherosclerosis ([place]) 01/06/2013  . PAF (paroxysmal atrial fibrillation) ([place]) 01/06/2013  . Lower extremity edema 01/06/2013  . CKD (chronic kidney disease), stage III 01/06/2013  . Bacteremia 12/30/2012  . S/P AVR 12/13/2012  . Protein-calorie malnutrition, severe ([place]) 12/13/2012  . Sinus bradycardia 10/04/2011  . Hypothyroidism 02/27/2011  . Restless legs 02/27/2011  . Other pancytopenia ([place]) 12/06/2009  . HYPERURICEMIA, ASYMPTOMATIC 07/25/2009  . HYPERGLYCEMIA, FASTING 01/05/2009  . CAD, NATIVE VESSEL 12/21/2008  . Diabetes mellitus ([place]) 10/06/2008  . Aortic valve disorder 05/15/2008  . ACTION TREMOR 01/14/2007  . POPLITEAL CYST, RIGHT 11/05/2006  . Gout 05/13/2006  . Thrombocytopenia ([place]) 05/13/2006  . ANXIETY 05/13/2006  . Essential hypertension 05/13/2006  . CVA 05/13/2006  . [place] DISEASE 05/13/2006  . Splenomegaly 05/13/2006    Current Outpatient Prescriptions on File Prior to Visit  Medication Sig Dispense Refill  . allopurinol (ZYLOPRIM) 100 MG tablet TAKE 1 TABLET EVERY DAY 90 tablet 1  . amiodarone (PACERONE) 200 MG tablet Take 1 tablet by mouth daily 90 tablet 0  . Cholecalciferol (VITAMIN D) 1000 UNITS capsule Take 1 capsule (1,000 Units total) by mouth daily. 90 capsule 3  . clonazePAM (KLONOPIN) 0.5 MG tablet TAKE ONE-HALF TABLET BY MOUTH AS NEEDED IF  YOU  WAKE  UP  EARLY 30 tablet 0  . colchicine 0.6 MG tablet Take 0.6 mg by mouth daily as needed (gout flares).     . ferrous sulfate 325 (65 FE) MG tablet Take 325 mg by mouth 2 (two) times daily with a meal.    . fish oil-omega-3 fatty acids 1000 MG capsule Take 1 g by mouth daily.      . Flaxseed, Linseed, (FLAX SEED OIL) 1000 MG CAPS Take 1 capsule by mouth daily.     . furosemide (LASIX) 80 MG tablet Take 120 mg by mouth daily. Patient taking 80 mg in the morning and 40 mg in the evening    . levothyroxine (SYNTHROID, LEVOTHROID) 25 MCG tablet  Take 25 mcg by mouth daily before breakfast.    . metolazone (ZAROXOLYN) 2.5 MG tablet Take 1 tablet (2.5 mg total) by mouth daily as needed (edema). 30 tablet 3  . Multiple Vitamin (MULTIVITAMIN) tablet Take 1 tablet by mouth daily.      [name] Kitchen omeprazole (PRILOSEC) 20 MG capsule Take 1 capsule (20 mg total) by mouth daily. 90 capsule 1  . potassium chloride (K-DUR) 10 MEQ tablet Take 1 tablet (10 mEq total) by mouth daily. 90 tablet 1  . propranolol (INDERAL) 10 MG tablet Take 1 tablet (10 mg total) by mouth 2 (two) times daily. 180 tablet 3  . sertraline (ZOLOFT) 50 MG tablet TAKE 1 TABLET (50 MG TOTAL) BY MOUTH AT BEDTIME. 90 tablet 0  . traMADol (ULTRAM) 50 MG tablet Take 50 mg by mouth every 8 (eight) hours as needed (pain).    . vitamin C (ASCORBIC ACID) 500 MG tablet Take 1 tablet (500 mg total) by mouth daily. 90 tablet 3   No current facility-administered medications on file prior to visit.    Past Medical History  Diagnosis Date  . Stroke ([place])   . PAF (paroxysmal atrial fibrillation) ([facility])     a. amiodarone therapy; not felt to be a candidate for anticoagulation with pancytopenia. b. Back in AF 09/2014 - decision was made to pursue rate control only.  . Bradycardia   . Aortic stenosis     a. Previously severe -> s/p minimally invasive tissue AVR with Dr. Evelina Dun at [facility] 01/2011 (pre-AVR cath with no obs CAD);  b. 05/2014 s/p TAVR (23 mm Edwards Sapien 3).  . Hypertension   . Pancytopenia ([place])     a. possible chronic lymphoproliferative disorder or splenic lymphoma.  . Action tremor   . Thrombocytopenia ([place])     Dr. Benay Spice  . Colon polyp   . Anxiety   . Splenomegaly   . Gout   . GERD (gastroesophageal reflux disease)   . Hypothyroidism   . Leukopenia     Chronic pancytopenia  . Degenerative disc disease   . Joint effusion, knee     left knee  . Synovial cyst of popliteal space   . Cellulitis of left leg 10/11-16/2012  . CKD (chronic kidney disease), stage III   .  Chronic diastolic CHF (congestive heart failure) ([place])     a. 12/15/2012 TEE: EF 60-65%, no veg.  . Bacteremia     a. 12/2012 - S bovis;  b. 12/2012 TEE w/o veg;  c. Seeing ID->Rocephin therapy extended to 01/25/2013 via PICC for possible endocarditis (No veg on TEE).  . High cholesterol   . Diabetes mellitus     "borderline" (01/05/2013)  . History of blood transfusion 01/2011; 11/2012  . H/O hiatal hernia   .  Throat cancer ([place])     s/p lasered  . Diverticulosis   . S/P TAVR (transcatheter aortic valve replacement)     a. 05/14/2014 TAVR: 23 mm Edwards Sapien 3 transcatheter heart valve placed valve-in-valve for prosthetic valve dysfunction via open right transfemoral approach  . QT prolongation   . Hypotension     Past Surgical History  Procedure Laterality Date  . Cholecystectomy    . Joint effusion      left knee  . Aortic valve replacement  01/23/2011    via minimally invasive approach per Dr Evelina Dun, [facility]  . Tee without cardioversion N/A 12/15/2012    Procedure: TRANSESOPHAGEAL ECHOCARDIOGRAM (TEE);  Surgeon: [name], MD;  Location: [facility] ENDOSCOPY;  Service: Cardiovascular;  Laterality: N/A;  . Cardiac valve replacement    . Inguinal hernia repair Right   . Excisional hemorrhoidectomy    . Cardiac catheterization    . Surgery scrotal / testicular      "removed one" (01/05/2013)  . 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 Tee without cardioversion N/A 03/26/2014    Procedure: TRANSESOPHAGEAL ECHOCARDIOGRAM (TEE);  Surgeon: Josue Hector, MD;  Location: [facility] ENDOSCOPY;  Service: Cardiovascular;  Laterality: N/A;  . Transcatheter aortic valve replacement, transfemoral Right 05/14/2014    Procedure: TRANSCATHETER AORTIC VALVE REPLACEMENT, TRANSFEMORAL;  Surgeon: [name], MD;  Location: [place];  Service: Open Heart Surgery;   Laterality: Right;  . Tee without cardioversion N/A 05/14/2014    Procedure: TRANSESOPHAGEAL ECHOCARDIOGRAM (TEE);  Surgeon: [name], MD;  Location: [place];  Service: Open Heart Surgery;  Laterality: N/A;    Social History   Social History  . Marital Status: Married    Spouse Name: N/A  . Number of Children: 3  . Years of Education: N/A   Occupational History  . Retired Agricultural consultant    Social History Main Topics  . Smoking status: Former Smoker -- 0.75 packs/day for 24 years    Types: Cigarettes    Quit date: 01/11/1962  . Smokeless tobacco: Never Used  . Alcohol Use: No  . Drug Use: No  . Sexual Activity: No   Other Topics Concern  . Not on file   Social History Narrative   [place] farm outside of [place]   Lives with his wife   Smoked until 1964 about 7 cigarettes a day for 30 years   No alcohol history.   No drug history   Very active, no regimented exercise                Family History  Problem Relation Age of Onset  . Colon cancer Other   . Stroke Other   . Lung cancer Mother   . Heart disease Father   . Cancer Brother     Review of Systems  Constitutional: Negative for fever, chills and appetite change.  Respiratory: Positive for cough (primarily dry, occ productive), shortness of breath (mild) and wheezing.   Cardiovascular: Positive for chest pain, palpitations (occasional) and leg swelling.  Gastrointestinal: Negative for nausea, abdominal pain, diarrhea and constipation.  Genitourinary: Negative for dysuria, hematuria and difficulty urinating.  Neurological: Positive for headaches. Negative for dizziness and light-headedness.       Objective:   Filed Vitals:   05/09/15 1039  BP: 106/60  Pulse: 91  Temp:  98.2 F (36.8 C)  Resp: 18   Filed Weights   05/09/15 1039  Weight: 190 lb (86.183 kg)   Body mass index is 27.26 kg/(m^2).   Physical Exam Constitutional: Appears well-developed and well-nourished. No distress.  Neck: right  tragus with inflamed cyst that is painful. Neck supple. No tracheal deviation present. No thyromegaly present.  No carotid bruit. No cervical adenopathy.   Cardiovascular: Normal rate, regular rhythm and normal heart sounds.   3/6 systolic murmur heard.  3+ pitting edema b/l LE Pulmonary/Chest: Effort normal.  No respiratory distress. No crackles.  Diffuse wheeze. Abdomen: distended with pitting in posterior/sides of abdomen from fluid overload  Skin: small blister right lateral hip without surrounding edema or erythema, no puss         Assessment & Plan:    See derm about cyst in right ear - may need to be drained/removed.  Try warm compresses for now  Blister on abdomen Not infected Topical antibacterial ointment, bandaid Monitor closely   See Problem List for Assessment and Plan of chronic medical problems.

## 2015-05-09 NOTE — Patient Instructions (Signed)
  Test(s) ordered today, including a chest xray and blood work.   We will call you today with the results and further instruction.

## 2015-05-09 NOTE — Telephone Encounter (Signed)
Lab called for Critical Value, Platelets 38.0

## 2015-05-11 ENCOUNTER — Other Ambulatory Visit: Payer: Self-pay | Admitting: Cardiovascular Disease

## 2015-05-13 ENCOUNTER — Other Ambulatory Visit (INDEPENDENT_AMBULATORY_CARE_PROVIDER_SITE_OTHER): Payer: Commercial Managed Care - HMO

## 2015-05-13 DIAGNOSIS — N183 Chronic kidney disease, stage 3 unspecified: Secondary | ICD-10-CM

## 2015-05-13 LAB — COMPREHENSIVE METABOLIC PANEL
ALT: 23 U/L (ref 0–53)
AST: 28 U/L (ref 0–37)
Albumin: 3.5 g/dL (ref 3.5–5.2)
Alkaline Phosphatase: 101 U/L (ref 39–117)
BILIRUBIN TOTAL: 0.8 mg/dL (ref 0.2–1.2)
BUN: 56 mg/dL — AB (ref 6–23)
CALCIUM: 8.9 mg/dL (ref 8.4–10.5)
CO2: 29 meq/L (ref 19–32)
CREATININE: 1.97 mg/dL — AB (ref 0.40–1.50)
Chloride: 97 mEq/L (ref 96–112)
GFR: 34.03 mL/min — ABNORMAL LOW (ref >60.00)
GLUCOSE: 144 mg/dL — AB (ref 70–99)
Potassium: 4 mEq/L (ref 3.5–5.1)
SODIUM: 135 meq/L (ref 135–145)
Total Protein: 5.8 g/dL — ABNORMAL LOW (ref 6.0–8.3)

## 2015-05-16 ENCOUNTER — Telehealth: Payer: Self-pay | Admitting: Oncology

## 2015-05-16 ENCOUNTER — Ambulatory Visit (INDEPENDENT_AMBULATORY_CARE_PROVIDER_SITE_OTHER): Payer: Commercial Managed Care - HMO | Admitting: Family

## 2015-05-16 ENCOUNTER — Ambulatory Visit (HOSPITAL_BASED_OUTPATIENT_CLINIC_OR_DEPARTMENT_OTHER): Payer: Commercial Managed Care - HMO | Admitting: Nurse Practitioner

## 2015-05-16 ENCOUNTER — Other Ambulatory Visit (HOSPITAL_BASED_OUTPATIENT_CLINIC_OR_DEPARTMENT_OTHER): Payer: Commercial Managed Care - HMO

## 2015-05-16 ENCOUNTER — Encounter: Payer: Self-pay | Admitting: Family

## 2015-05-16 VITALS — BP 110/60 | HR 75 | Temp 97.7°F | Resp 16 | Ht 70.0 in | Wt 179.4 lb

## 2015-05-16 VITALS — BP 101/55 | HR 81 | Temp 97.7°F | Resp 18 | Ht 70.0 in | Wt 180.6 lb

## 2015-05-16 DIAGNOSIS — E119 Type 2 diabetes mellitus without complications: Secondary | ICD-10-CM

## 2015-05-16 DIAGNOSIS — D696 Thrombocytopenia, unspecified: Secondary | ICD-10-CM

## 2015-05-16 DIAGNOSIS — R161 Splenomegaly, not elsewhere classified: Secondary | ICD-10-CM

## 2015-05-16 DIAGNOSIS — D61818 Other pancytopenia: Secondary | ICD-10-CM | POA: Diagnosis not present

## 2015-05-16 DIAGNOSIS — R600 Localized edema: Secondary | ICD-10-CM

## 2015-05-16 DIAGNOSIS — E8770 Fluid overload, unspecified: Secondary | ICD-10-CM

## 2015-05-16 LAB — CBC WITH DIFFERENTIAL/PLATELET
BASO%: 0.3 % (ref 0.0–2.0)
BASOS ABS: 0 10*3/uL (ref 0.0–0.1)
EOS%: 1.2 % (ref 0.0–7.0)
Eosinophils Absolute: 0 10*3/uL (ref 0.0–0.5)
HCT: 30 % — ABNORMAL LOW (ref 38.4–49.9)
HEMOGLOBIN: 9.7 g/dL — AB (ref 13.0–17.1)
LYMPH%: 13.9 % — AB (ref 14.0–49.0)
MCH: 29 pg (ref 27.2–33.4)
MCHC: 32.3 g/dL (ref 32.0–36.0)
MCV: 89.8 fL (ref 79.3–98.0)
MONO#: 0.2 10*3/uL (ref 0.1–0.9)
MONO%: 5.8 % (ref 0.0–14.0)
NEUT#: 2.7 10*3/uL (ref 1.5–6.5)
NEUT%: 78.8 % — ABNORMAL HIGH (ref 39.0–75.0)
NRBC: 0 % (ref 0–0)
PLATELETS: 30 10*3/uL — AB (ref 140–400)
RBC: 3.34 10*6/uL — ABNORMAL LOW (ref 4.20–5.82)
RDW: 17.9 % — AB (ref 11.0–14.6)
WBC: 3.5 10*3/uL — AB (ref 4.0–10.3)
lymph#: 0.5 10*3/uL — ABNORMAL LOW (ref 0.9–3.3)

## 2015-05-16 NOTE — Assessment & Plan Note (Signed)
Cough with concern for continued volume overload of heart failure from previous treatment of sepsis and pyelonephritis. Recommend additional days of metolazone and continued current dosage of furosemide. Previous x-rays with no significant pulmonary edema. Follow up pending medication if symptoms worsen or does not improve.

## 2015-05-16 NOTE — Telephone Encounter (Signed)
Gave adn printed appt sched and avs for pt for June  °

## 2015-05-16 NOTE — Assessment & Plan Note (Addendum)
Lower extremity edema most likely related to hypervolemia. Continue current dosage of furosemide and add metolazone x 3 days. Encourage elevation and decreased sodium.

## 2015-05-16 NOTE — Progress Notes (Signed)
Subjective:    Patient ID: [name], male    DOB: July 18, 1922, 82 y.o.   MRN: [id number]  Chief Complaint  Patient presents with  . Hospitalization Follow-up    was seen in an ED in [facility] for blood in stool    HPI:  [name] is a 82 y.o. male who  has a past medical history of Stroke ([place]); PAF (paroxysmal atrial fibrillation) ([place]); Bradycardia; Aortic stenosis; Hypertension; Pancytopenia ([place]); Action tremor; Thrombocytopenia ([place]); Colon polyp; Anxiety; Splenomegaly; Gout; GERD (gastroesophageal reflux disease); Hypothyroidism; Leukopenia; Degenerative disc disease; Joint effusion, knee; Synovial cyst of popliteal space; Cellulitis of left leg (10/11-16/2012); CKD (chronic kidney disease), stage III; Chronic diastolic CHF (congestive heart failure) ([place]); Bacteremia; High cholesterol; Diabetes mellitus; History of blood transfusion (01/2011; 11/2012); H/O hiatal hernia; Throat cancer ([place]); Diverticulosis; S/P TAVR (transcatheter aortic valve replacement); QT prolongation; and Hypotension. and presents today for a follow up office visit.   Recently evaluated in the Emergency Department in [place] for blood in his urine and he was found to have pyelonephtritis and sepis. He was admitted to the intensive care unit for a couple of nights. Since leaving the hospital he reports that he has not noted any additional blood in his urine and the fluid levels are slowly returning to normal. He reports that he is eating okay and has returned to baseline. No fever or abdominal pain. Does endorse the start of a cough when he was in the hospital and is aggravated when he is laying down. Course has stayed about the same. Modifying factors include Robitussion which has not helped very much. Recently seen in the office and noted to have elevated BNP and anemia. Kidney function was also noted to be CKD Stage III B.   Allergies  Allergen Reactions  . Penicillins Other (See Comments)    Does not  remember reaction (~year 1950)  . Neomycin-Bacitracin Zn-Polymyx Other (See Comments)    ? Reaction (thinks he remembers redness)     Current Outpatient Prescriptions on File Prior to Visit  Medication Sig Dispense Refill  . allopurinol (ZYLOPRIM) 100 MG tablet TAKE 1 TABLET EVERY DAY 90 tablet 1  . amiodarone (PACERONE) 200 MG tablet Take 1 tablet by mouth daily 90 tablet 0  . amLODipine (NORVASC) 5 MG tablet Take 5 mg by mouth daily.    . Cholecalciferol (VITAMIN D) 1000 UNITS capsule Take 1 capsule (1,000 Units total) by mouth daily. 90 capsule 3  . clonazePAM (KLONOPIN) 0.5 MG tablet TAKE ONE-HALF TABLET BY MOUTH AS NEEDED IF  YOU  WAKE  UP  EARLY 30 tablet 0  . colchicine 0.6 MG tablet Take 0.6 mg by mouth daily as needed (gout flares).     . ferrous sulfate 325 (65 FE) MG tablet Take 325 mg by mouth 2 (two) times daily with a meal.    . fish oil-omega-3 fatty acids 1000 MG capsule Take 1 g by mouth daily.      . Flaxseed, Linseed, (FLAX SEED OIL) 1000 MG CAPS Take 1 capsule by mouth daily.     . furosemide (LASIX) 80 MG tablet Take 120 mg by mouth daily. Patient taking 80 mg in the morning and 40 mg in the evening    . levothyroxine (SYNTHROID, LEVOTHROID) 25 MCG tablet Take 25 mcg by mouth daily before breakfast.    . metolazone (ZAROXOLYN) 2.5 MG tablet Take 1 tablet (2.5 mg total) by mouth daily as needed (edema). 30 tablet 3  .  Multiple Vitamin (MULTIVITAMIN) tablet Take 1 tablet by mouth daily.      . mupirocin ointment (BACTROBAN) 2 % Place 1 application into the nose 2 (two) times daily. 22 g 0  . omeprazole (PRILOSEC) 20 MG capsule Take 1 capsule (20 mg total) by mouth daily. 90 capsule 1  . potassium chloride (K-DUR) 10 MEQ tablet Take 1 tablet (10 mEq total) by mouth daily. 90 tablet 1  . propranolol (INDERAL) 10 MG tablet Take 1 tablet (10 mg total) by mouth 2 (two) times daily. 180 tablet 3  . sertraline (ZOLOFT) 50 MG tablet TAKE 1 TABLET (50 MG TOTAL) BY MOUTH AT BEDTIME.  90 tablet 0  . traMADol (ULTRAM) 50 MG tablet Take 50 mg by mouth every 8 (eight) hours as needed (pain).    . vitamin C (ASCORBIC ACID) 500 MG tablet Take 1 tablet (500 mg total) by mouth daily. 90 tablet 3   No current facility-administered medications on file prior to visit.     Past Surgical History  Procedure Laterality Date  . Cholecystectomy    . Joint effusion      left knee  . Aortic valve replacement  01/23/2011    via minimally invasive approach per Dr Evelina Dun, [facility]  . Tee without cardioversion N/A 12/15/2012    Procedure: TRANSESOPHAGEAL ECHOCARDIOGRAM (TEE);  Surgeon: [name], MD;  Location: [facility] ENDOSCOPY;  Service: Cardiovascular;  Laterality: N/A;  . Cardiac valve replacement    . Inguinal hernia repair Right   . Excisional hemorrhoidectomy    . Cardiac catheterization    . Surgery scrotal / testicular      "removed one" (01/05/2013)  . 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 Tee without cardioversion N/A 03/26/2014    Procedure: TRANSESOPHAGEAL ECHOCARDIOGRAM (TEE);  Surgeon: Josue Hector, MD;  Location: [facility] ENDOSCOPY;  Service: Cardiovascular;  Laterality: N/A;  . Transcatheter aortic valve replacement, transfemoral Right 05/14/2014    Procedure: TRANSCATHETER AORTIC VALVE REPLACEMENT, TRANSFEMORAL;  Surgeon: [name], MD;  Location: [facility];  Service: Open Heart Surgery;  Laterality: Right;  . Tee without cardioversion N/A 05/14/2014    Procedure: TRANSESOPHAGEAL ECHOCARDIOGRAM (TEE);  Surgeon: [name], MD;  Location: [place];  Service: Open Heart Surgery;  Laterality: N/A;    Past Medical History  Diagnosis Date  . Stroke ([place])   . PAF (paroxysmal atrial fibrillation) ([facility])     a. amiodarone therapy; not felt to be a candidate for anticoagulation with pancytopenia. b. Back in AF 09/2014 - decision was made  to pursue rate control only.  . Bradycardia   . Aortic stenosis     a. Previously severe -> s/p minimally invasive tissue AVR with Dr. Evelina Dun at [facility] 01/2011 (pre-AVR cath with no obs CAD);  b. 05/2014 s/p TAVR (23 mm Edwards Sapien 3).  . Hypertension   . Pancytopenia ([place])     a. possible chronic lymphoproliferative disorder or splenic lymphoma.  . Action tremor   . Thrombocytopenia ([place])     Dr. Benay Spice  . Colon polyp   . Anxiety   . Splenomegaly   . Gout   . GERD (gastroesophageal reflux disease)   . Hypothyroidism   . Leukopenia     Chronic pancytopenia  . Degenerative disc disease   . Joint effusion, knee  left knee  . Synovial cyst of popliteal space   . Cellulitis of left leg 10/11-16/2012  . CKD (chronic kidney disease), stage III   . Chronic diastolic CHF (congestive heart failure) ([place])     a. 12/15/2012 TEE: EF 60-65%, no veg.  . Bacteremia     a. 12/2012 - S bovis;  b. 12/2012 TEE w/o veg;  c. Seeing ID->Rocephin therapy extended to 01/25/2013 via PICC for possible endocarditis (No veg on TEE).  . High cholesterol   . Diabetes mellitus     "borderline" (01/05/2013)  . History of blood transfusion 01/2011; 11/2012  . H/O hiatal hernia   . Throat cancer ([place])     s/p lasered  . Diverticulosis   . S/P TAVR (transcatheter aortic valve replacement)     a. 05/14/2014 TAVR: 23 mm Edwards Sapien 3 transcatheter heart valve placed valve-in-valve for prosthetic valve dysfunction via open right transfemoral approach  . QT prolongation   . Hypotension     Review of Systems  Constitutional: Negative for fever and chills.  Respiratory: Positive for cough. Negative for chest tightness and wheezing.   Cardiovascular: Positive for leg swelling. Negative for chest pain and palpitations.  Genitourinary: Negative for dysuria, urgency, frequency and hematuria.      Objective:    BP 110/60 mmHg  Pulse 75  Temp(Src) 97.7 F (36.5 C) (Oral)  Resp 16  Ht 5\' 10"  (1.778 m)  Wt  179 lb 6.4 oz (81.375 kg)  BMI 25.74 kg/m2  SpO2 99% Nursing note and vital signs reviewed.  Physical Exam  Constitutional: He is oriented to person, place, and time. He appears well-developed and well-nourished. No distress.  Cardiovascular: Normal rate, regular rhythm and intact distal pulses.   Murmur heard. 1-2+ moderate pitting edema noted in bilateral lower extremities.   Pulmonary/Chest: Effort normal and breath sounds normal. He has no wheezes.  Neurological: He is alert and oriented to person, place, and time.  Skin: Skin is warm and dry.  Psychiatric: He has a normal mood and affect. His behavior is normal. Judgment and thought content normal.       Assessment & Plan:   Problem List Items Addressed This Visit      Other   Lower extremity edema    Lower extremity edema most likely related to hypervolemia. Continue current dosage of furosemide and add metolazone x 3 days. Encourage elevation and decreased sodium.       Hypervolemia - Primary    Cough with concern for continued volume overload of heart failure from previous treatment of sepsis and pyelonephritis. Recommend additional days of metolazone and continued current dosage of furosemide. Previous x-rays with no significant pulmonary edema. Follow up pending medication if symptoms worsen or does not improve.           I am having Mr. Liden maintain his fish oil-omega-3 fatty acids, Flax Seed Oil, multivitamin, vitamin C, Vitamin D, colchicine, potassium chloride, ferrous sulfate, furosemide, levothyroxine, traMADol, clonazePAM, metolazone, propranolol, omeprazole, amiodarone, sertraline, allopurinol, mupirocin ointment, and amLODipine.   Follow-up: Return if symptoms worsen or fail to improve.  Mauricio Po, FNP

## 2015-05-16 NOTE — Patient Instructions (Addendum)
Thank you for choosing [facility].  Summary/Instructions:  Please continue to take your medications as prescribed.   Take the metolazone for the next 2-3 days to continue decreasing fluids. The cough is most likely associated with increased volume.   Follow up with Dr. Quay Burow as scheduled.   Please stop by radiology on the basement level of the building for your x-rays. Your results will be released to [facility] (or called to you) after review, usually within 72 hours after test completion. If any treatments or changes are necessary, you will be notified at that same time.  If your symptoms worsen or fail to improve, please contact our office for further instruction, or in case of emergency go directly to the emergency room at the closest medical facility.

## 2015-05-16 NOTE — Progress Notes (Signed)
  [facility] OFFICE PROGRESS NOTE   Diagnosis:  Pancytopenia  INTERVAL HISTORY:   Mr. Elting returns as scheduled. He reports he was admitted to the hospital in [place] in April with pyelonephritis. He remained hospitalized for 7 days. He reports receiving a platelet transfusion. He is feeling much better. He denies new bleeding. No fever.  Objective:  Vital signs in last 24 hours:  Blood pressure 101/55, pulse 81, temperature 97.7 F (36.5 C), temperature source Oral, resp. rate 18, height _0  (1.778 m), weight 180 lb 9.6 oz (81.92 kg), SpO2 100 %.    HEENT: No thrush or ulcers. Lymphatics: No palpable cervical, supra clavicular, axillary or inguinal lymph nodes. Resp: Lungs clear bilaterally. Cardio: Irregular. GI: No hepatomegaly. Spleen palpable left mid abdomen with associated tenderness. Vascular: Trace to 1+ lower leg edema bilaterally. Skin: Gray/purple discoloration diffusely over the arms.    Lab Results:  Lab Results  Component Value Date   WBC 3.5* 05/16/2015   HGB 9.7* 05/16/2015   HCT 30.0* 05/16/2015   MCV 89.8 05/16/2015   PLT 30* 05/16/2015   NEUTROABS 2.7 05/16/2015    Imaging:  No results found.  Medications: I have reviewed the patient's current medications.  Assessment/Plan: Chronic pancytopenia/splenomegaly-likely related to a chronic lymphoproliferative disorder,? Splenic lymphoma   Bone marrow biopsy at [facility] November 2012-slightly hypercellular marrow with trilineage hematopoiesis, interstitial Nieman-Pick like histiocytosis. Negative for dysplasia, negative for lymphoma, negative for increased blasts. 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 History of severe aortic stenosis, status post aortic valve replacement surgery at [facility] on 01/23/2011; status post transcatheter aortic valve replacement 05/14/2014  3. History of gout  4. Diabetes  5. Streptococcus  bacteremia-TEE negative for endocarditis  6. Malaise-likely multifactorial 7. Renal insufficiency  8. Paroxysmal atrial fibrillation  9. Colon polyps noted on a virtual colonoscopy 02/10/2013  10. Anemia secondary to renal insufficiency and the chronic lymphoproliferative disorder. Trial of weekly erythropoietin initiated 04/09/2014 with improvement. The anemia progressed. Erythropoietin resumed 07/02/2014. Discontinued 09/29/2014 secondary to patient not experiencing clinical benefit 11. Right ear skin lesion-possibly a basal cell carcinoma, observe for now 12. Hospitalization with pyelonephritis in [place]   Disposition: Mr. Bloom remains stable from a hematologic standpoint. He will return for a follow-up visit and CBC in 6 weeks. He understands to contact the office in the interim with any bleeding.  Plan reviewed with Dr. Benay Spice.    Ned Card ANP/GNP-BC   05/16/2015  8:55 AM

## 2015-05-16 NOTE — Progress Notes (Signed)
Pre visit review using our clinic review tool, if applicable. No additional management support is needed unless otherwise documented below in the visit note. 

## 2015-05-17 ENCOUNTER — Encounter (HOSPITAL_COMMUNITY): Payer: Self-pay | Admitting: Vascular Surgery

## 2015-05-17 ENCOUNTER — Inpatient Hospital Stay (HOSPITAL_COMMUNITY)
Admission: EM | Admit: 2015-05-17 | Discharge: 2015-05-21 | DRG: 871 | Disposition: A | Discharge status: Home / Self Care | Payer: Commercial Managed Care - HMO | Attending: Internal Medicine | Admitting: Internal Medicine

## 2015-05-17 ENCOUNTER — Emergency Department (HOSPITAL_COMMUNITY): Payer: Commercial Managed Care - HMO

## 2015-05-17 DIAGNOSIS — M109 Gout, unspecified: Secondary | ICD-10-CM | POA: Diagnosis present

## 2015-05-17 DIAGNOSIS — D61818 Other pancytopenia: Secondary | ICD-10-CM | POA: Diagnosis present

## 2015-05-17 DIAGNOSIS — Z952 Presence of prosthetic heart valve: Secondary | ICD-10-CM

## 2015-05-17 DIAGNOSIS — N390 Urinary tract infection, site not specified: Secondary | ICD-10-CM | POA: Diagnosis not present

## 2015-05-17 DIAGNOSIS — R6521 Severe sepsis with septic shock: Secondary | ICD-10-CM | POA: Diagnosis present

## 2015-05-17 DIAGNOSIS — I480 Paroxysmal atrial fibrillation: Secondary | ICD-10-CM | POA: Diagnosis present

## 2015-05-17 DIAGNOSIS — Z87891 Personal history of nicotine dependence: Secondary | ICD-10-CM

## 2015-05-17 DIAGNOSIS — Z8673 Personal history of transient ischemic attack (TIA), and cerebral infarction without residual deficits: Secondary | ICD-10-CM

## 2015-05-17 DIAGNOSIS — Z800 Family history of malignant neoplasm of digestive organs: Secondary | ICD-10-CM

## 2015-05-17 DIAGNOSIS — Z87440 Personal history of urinary (tract) infections: Secondary | ICD-10-CM

## 2015-05-17 DIAGNOSIS — R0902 Hypoxemia: Secondary | ICD-10-CM | POA: Diagnosis present

## 2015-05-17 DIAGNOSIS — I959 Hypotension, unspecified: Secondary | ICD-10-CM | POA: Diagnosis present

## 2015-05-17 DIAGNOSIS — Z823 Family history of stroke: Secondary | ICD-10-CM

## 2015-05-17 DIAGNOSIS — E039 Hypothyroidism, unspecified: Secondary | ICD-10-CM | POA: Diagnosis present

## 2015-05-17 DIAGNOSIS — Z85819 Personal history of malignant neoplasm of unspecified site of lip, oral cavity, and pharynx: Secondary | ICD-10-CM

## 2015-05-17 DIAGNOSIS — I509 Heart failure, unspecified: Secondary | ICD-10-CM

## 2015-05-17 DIAGNOSIS — K219 Gastro-esophageal reflux disease without esophagitis: Secondary | ICD-10-CM | POA: Diagnosis present

## 2015-05-17 DIAGNOSIS — E43 Unspecified severe protein-calorie malnutrition: Secondary | ICD-10-CM | POA: Diagnosis present

## 2015-05-17 DIAGNOSIS — Z87442 Personal history of urinary calculi: Secondary | ICD-10-CM

## 2015-05-17 DIAGNOSIS — N183 Chronic kidney disease, stage 3 unspecified: Secondary | ICD-10-CM | POA: Diagnosis present

## 2015-05-17 DIAGNOSIS — E7800 Pure hypercholesterolemia, unspecified: Secondary | ICD-10-CM | POA: Diagnosis present

## 2015-05-17 DIAGNOSIS — Z79899 Other long term (current) drug therapy: Secondary | ICD-10-CM

## 2015-05-17 DIAGNOSIS — E1122 Type 2 diabetes mellitus with diabetic chronic kidney disease: Secondary | ICD-10-CM | POA: Diagnosis present

## 2015-05-17 DIAGNOSIS — N10 Acute tubulo-interstitial nephritis: Secondary | ICD-10-CM | POA: Diagnosis present

## 2015-05-17 DIAGNOSIS — N170 Acute kidney failure with tubular necrosis: Secondary | ICD-10-CM | POA: Diagnosis present

## 2015-05-17 DIAGNOSIS — I10 Essential (primary) hypertension: Secondary | ICD-10-CM | POA: Diagnosis present

## 2015-05-17 DIAGNOSIS — F329 Major depressive disorder, single episode, unspecified: Secondary | ICD-10-CM | POA: Diagnosis present

## 2015-05-17 DIAGNOSIS — A419 Sepsis, unspecified organism: Secondary | ICD-10-CM | POA: Insufficient documentation

## 2015-05-17 DIAGNOSIS — A4151 Sepsis due to Escherichia coli [E. coli]: Principal | ICD-10-CM | POA: Insufficient documentation

## 2015-05-17 DIAGNOSIS — I5033 Acute on chronic diastolic (congestive) heart failure: Secondary | ICD-10-CM | POA: Diagnosis present

## 2015-05-17 DIAGNOSIS — R161 Splenomegaly, not elsewhere classified: Secondary | ICD-10-CM | POA: Diagnosis present

## 2015-05-17 DIAGNOSIS — Z8249 Family history of ischemic heart disease and other diseases of the circulatory system: Secondary | ICD-10-CM

## 2015-05-17 DIAGNOSIS — Z801 Family history of malignant neoplasm of trachea, bronchus and lung: Secondary | ICD-10-CM

## 2015-05-17 DIAGNOSIS — I130 Hypertensive heart and chronic kidney disease with heart failure and stage 1 through stage 4 chronic kidney disease, or unspecified chronic kidney disease: Secondary | ICD-10-CM | POA: Diagnosis present

## 2015-05-17 DIAGNOSIS — F32A Depression, unspecified: Secondary | ICD-10-CM | POA: Diagnosis present

## 2015-05-17 DIAGNOSIS — L989 Disorder of the skin and subcutaneous tissue, unspecified: Secondary | ICD-10-CM | POA: Diagnosis present

## 2015-05-17 LAB — URINE MICROSCOPIC-ADD ON

## 2015-05-17 LAB — BASIC METABOLIC PANEL
Anion gap: 12 (ref 5–15)
BUN: 60 mg/dL — ABNORMAL HIGH (ref 6–20)
CALCIUM: 8.9 mg/dL (ref 8.9–10.3)
CHLORIDE: 103 mmol/L (ref 101–111)
CO2: 25 mmol/L (ref 22–32)
CREATININE: 2.2 mg/dL — AB (ref 0.61–1.24)
GFR, EST AFRICAN AMERICAN: 28 mL/min — AB (ref >60)
GFR, EST NON AFRICAN AMERICAN: 25 mL/min — AB (ref >60)
Glucose, Bld: 186 mg/dL — ABNORMAL HIGH (ref 65–99)
Potassium: 3.8 mmol/L (ref 3.5–5.1)
SODIUM: 140 mmol/L (ref 135–145)

## 2015-05-17 LAB — URINALYSIS, ROUTINE W REFLEX MICROSCOPIC
Bilirubin Urine: NEGATIVE
Glucose, UA: NEGATIVE mg/dL
Ketones, ur: NEGATIVE mg/dL
NITRITE: POSITIVE — AB
PROTEIN: 100 mg/dL — AB
SPECIFIC GRAVITY, URINE: 1.01 (ref 1.005–1.030)
pH: 5.5 (ref 5.0–8.0)

## 2015-05-17 NOTE — ED Notes (Signed)
Pt had 2 episodes of emesis PTA and states after that his pain had resolved.

## 2015-05-17 NOTE — ED Notes (Signed)
Pt reports to the ED for eval of right flank pain x approx 1 hour. Reports some mild dysuria but denies urgency or hematuria. Is urinating more frequently but his lasix was recently increased. Pt also having some BLE swelling and SOB. Initial RA sats for EMS were 87%. Denies any CP. Was tx in April for a kidney infection at a hospital in [facility]. Pt A&Ox4, resp e/u, and skin warm and dry.

## 2015-05-17 NOTE — ED Provider Notes (Signed)
CSN: [id number]     Arrival date & time 05/17/15  2256 History  By signing my name below, I, [name], attest that this documentation has been prepared under the direction and in the presence of Walter Iles, MD. Electronically Signed: Eustaquio Walton, ED Scribe. 05/17/2015. 11:55 PM.   Chief Complaint  Patient presents with  . Back Pain  . Shortness of Breath   The history is provided by the patient. No language interpreter was used.     HPI Comments: [name] is a 82 y.o. male brought in by ambulance, who presents to the Emergency Department complaining of sudden onset, constant, right flank pain that began earlier tonight. Pt states that he was laying in bed when the pain came on. The pain was mildly relieved after vomiting. He mentions having dysuria and frequency but states his Lasix was recently increased. His frequency increased tonight. Pt also mentions having nausea, vomiting, mild shortness of breath, productive cough, and chills. Family reports that pt had shortness of breath only with the chills and emesis. Pt was not complaining of shortness of breath prior to the flank pain. Pt states that he is still feeling mildly short of breath but that it is gradually improving. He has had similar pain in the distant past with a previous kidney stone. Pt did not take any fever reducing medicines tonight. He mentions that 3 weeks ago while he was in [place] he had hematuria and frequency. He was seen at an ED in [place] at that time with a diagnosis of pyelonephritis and admitted for 7 days. Family members report that since pt was in the hospital he has been having increased leg swelling with no relief from increase in Lasix. No recent sick contact. Denies diarrhea, abdominal pain, chest pain, or any other associated symptoms. No hx COPD or DVT/PE. He is not on any anticoagulants. Pt is a former smoker. Family mentions that pt's white count and platelets usually run low. They also note  that his BP usually runs low.  Past Medical History  Diagnosis Date  . Stroke ([place])   . PAF (paroxysmal atrial fibrillation) (HCC)     a. amiodarone therapy; not felt to be a candidate for anticoagulation with pancytopenia. b. Back in AF 09/2014 - decision was made to pursue rate control only.  . Bradycardia   . Aortic stenosis     a. Previously severe -> s/p minimally invasive tissue AVR with Dr. Evelina Dun at [facility] 01/2011 (pre-AVR cath with no obs CAD);  b. 05/2014 s/p TAVR (23 mm Edwards Sapien 3).  . Hypertension   . Pancytopenia ([place])     a. possible chronic lymphoproliferative disorder or splenic lymphoma.  . Action tremor   . Thrombocytopenia ([place])     Dr. Benay Spice  . Colon polyp   . Anxiety   . Splenomegaly   . Gout   . GERD (gastroesophageal reflux disease)   . Hypothyroidism   . Leukopenia     Chronic pancytopenia  . Degenerative disc disease   . Joint effusion, knee     left knee  . Synovial cyst of popliteal space   . Cellulitis of left leg 10/11-16/2012  . CKD (chronic kidney disease), stage III   . Chronic diastolic CHF (congestive heart failure) ([place])     a. 12/15/2012 TEE: EF 60-65%, no veg.  . Bacteremia     a. 12/2012 - S bovis;  b. 12/2012 TEE w/o veg;  c. Seeing ID->Rocephin therapy extended to  01/25/2013 via PICC for possible endocarditis (No veg on TEE).  . High cholesterol   . Diabetes mellitus     "borderline" (01/05/2013)  . History of blood transfusion 01/2011; 11/2012  . H/O hiatal hernia   . Throat cancer ([place])     s/p lasered  . Diverticulosis   . S/P TAVR (transcatheter aortic valve replacement)     a. 05/14/2014 TAVR: 23 mm Edwards Sapien 3 transcatheter heart valve placed valve-in-valve for prosthetic valve dysfunction via open right transfemoral approach  . QT prolongation   . Hypotension    Past Surgical History  Procedure Laterality Date  . Cholecystectomy    . Joint effusion      left knee  . Aortic valve replacement  01/23/2011    via  minimally invasive approach per Dr Evelina Dun, [facility]  . Tee without cardioversion N/A 12/15/2012    Procedure: TRANSESOPHAGEAL ECHOCARDIOGRAM (TEE);  Surgeon: [name], MD;  Location: [facility] ENDOSCOPY;  Service: Cardiovascular;  Laterality: N/A;  . Cardiac valve replacement    . Inguinal hernia repair Right   . Excisional hemorrhoidectomy    . Cardiac catheterization    . Surgery scrotal / testicular      "removed one" (01/05/2013)  . 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 Tee without cardioversion N/A 03/26/2014    Procedure: TRANSESOPHAGEAL ECHOCARDIOGRAM (TEE);  Surgeon: Josue Hector, MD;  Location: [facility] ENDOSCOPY;  Service: Cardiovascular;  Laterality: N/A;  . Transcatheter aortic valve replacement, transfemoral Right 05/14/2014    Procedure: TRANSCATHETER AORTIC VALVE REPLACEMENT, TRANSFEMORAL;  Surgeon: [name], MD;  Location: [place];  Service: Open Heart Surgery;  Laterality: Right;  . Tee without cardioversion N/A 05/14/2014    Procedure: TRANSESOPHAGEAL ECHOCARDIOGRAM (TEE);  Surgeon: [name], MD;  Location: [place];  Service: Open Heart Surgery;  Laterality: N/A;   Family History  Problem Relation Age of Onset  . Colon cancer Other   . Stroke Other   . Lung cancer Mother   . Heart disease Father   . Cancer Brother    Social History  Substance Use Topics  . Smoking status: Former Smoker -- 0.75 packs/day for 24 years    Types: Cigarettes    Quit date: 01/11/1962  . Smokeless tobacco: Never Used  . Alcohol Use: No    Review of Systems 10 Systems reviewed and all are negative for acute change except as noted in the HPI.   Allergies  Penicillins and Neomycin-bacitracin zn-polymyx  Home Medications   Prior to Admission medications   Medication Sig Start Date End Date Taking? Authorizing Provider  allopurinol  (ZYLOPRIM) 100 MG tablet TAKE 1 TABLET EVERY DAY 03/31/15   Binnie Rail, MD  amiodarone (PACERONE) 200 MG tablet Take 1 tablet by mouth daily 03/23/15   [name], MD  amLODipine (NORVASC) 5 MG tablet Take 5 mg by mouth daily. 03/10/15   Historical Provider, MD  Cholecalciferol (VITAMIN D) 1000 UNITS capsule Take 1 capsule (1,000 Units total) by mouth daily. 01/05/11   Renella Cunas, MD  clonazePAM (KLONOPIN) 0.5 MG tablet TAKE ONE-HALF TABLET BY MOUTH AS NEEDED IF  YOU  WAKE  UP  EARLY 12/06/14   Golden Circle, FNP  colchicine 0.6 MG tablet Take 0.6 mg by mouth daily as needed (gout flares).  04/03/11  Renella Cunas, MD  ferrous sulfate 325 (65 FE) MG tablet Take 325 mg by mouth 2 (two) times daily with a meal.    Historical Provider, MD  fish oil-omega-3 fatty acids 1000 MG capsule Take 1 g by mouth daily.      Historical Provider, MD  Flaxseed, Linseed, (FLAX SEED OIL) 1000 MG CAPS Take 1 capsule by mouth daily.     Historical Provider, MD  furosemide (LASIX) 80 MG tablet Take 120 mg by mouth daily. Patient taking 80 mg in the morning and 40 mg in the evening    Historical Provider, MD  levothyroxine (SYNTHROID, LEVOTHROID) 25 MCG tablet Take 25 mcg by mouth daily before breakfast.    Historical Provider, MD  metolazone (ZAROXOLYN) 2.5 MG tablet Take 1 tablet (2.5 mg total) by mouth daily as needed (edema). 12/22/14   [name], MD  Multiple Vitamin (MULTIVITAMIN) tablet Take 1 tablet by mouth daily.      Historical Provider, MD  mupirocin ointment (BACTROBAN) 2 % Place 1 application into the nose 2 (two) times daily. 05/09/15   Binnie Rail, MD  omeprazole (PRILOSEC) 20 MG capsule Take 1 capsule (20 mg total) by mouth daily. 03/11/15   Golden Circle, FNP  potassium chloride (K-DUR) 10 MEQ tablet Take 1 tablet (10 mEq total) by mouth daily. 02/04/14   [name], MD  propranolol (INDERAL) 10 MG tablet Take 1 tablet (10 mg total) by mouth 2 (two) times daily. 01/20/15   Binnie Rail,  MD  sertraline (ZOLOFT) 50 MG tablet TAKE 1 TABLET (50 MG TOTAL) BY MOUTH AT BEDTIME. 03/29/15   Binnie Rail, MD  traMADol (ULTRAM) 50 MG tablet Take 50 mg by mouth every 8 (eight) hours as needed (pain).    Historical Provider, MD  vitamin C (ASCORBIC ACID) 500 MG tablet Take 1 tablet (500 mg total) by mouth daily. 01/05/11   Renella Cunas, MD   BP 95/54 mmHg  Pulse 64  Temp(Src) 98.8 F (37.1 C) (Oral)  Resp 16  SpO2 95%   Physical Exam  Constitutional: He is oriented to person, place, and time. No distress.  Frail, chronically ill appearing elderly man, awake and comfortable  HENT:  Head: Normocephalic and atraumatic.  Dry mucous membranes  Eyes: Conjunctivae are normal. Pupils are equal, round, and reactive to light.  Neck: Neck supple.  Cardiovascular: Normal rate and regular rhythm.   Murmur heard.  Systolic murmur is present with a grade of 3/6  Pulmonary/Chest: Effort normal and breath sounds normal.  Abdominal: Soft. Bowel sounds are normal. He exhibits no distension. There is no tenderness.  Musculoskeletal: He exhibits edema.  3+ pitting edema BLE to mid-shins  Neurological: He is alert and oriented to person, place, and time.  Fluent speech  Skin: Skin is warm and dry.  Chronic venous stasis skin changes BLE  Psychiatric: He has a normal mood and affect. Judgment normal.  Nursing note and vitals reviewed.   ED Course  Procedures (including critical care time)  DIAGNOSTIC STUDIES: Oxygen Saturation is 95% on [place], normal by my interpretation.    COORDINATION OF CARE: 11:49 PM-Discussed treatment plan with pt at bedside and pt agreed to plan.   Labs Review Labs Reviewed  BASIC METABOLIC PANEL - Abnormal; Notable for the following:    Glucose, Bld 186 (*)    BUN 60 (*)    Creatinine, Ser 2.20 (*)    GFR calc non Af Amer 25 (*)  GFR calc Af Amer 28 (*)    All other components within normal limits  CBC WITH DIFFERENTIAL/PLATELET - Abnormal; Notable for the  following:    WBC 2.3 (*)    RBC 3.12 (*)    Hemoglobin 8.8 (*)    HCT 27.8 (*)    RDW 17.9 (*)    Platelets 26 (*)    Lymphs Abs 0.1 (*)    All other components within normal limits  URINALYSIS, ROUTINE W REFLEX MICROSCOPIC (NOT AT [facility]) - Abnormal; Notable for the following:    APPearance TURBID (*)    Hgb urine dipstick LARGE (*)    Protein, ur 100 (*)    Nitrite POSITIVE (*)    Leukocytes, UA LARGE (*)    All other components within normal limits  BRAIN NATRIURETIC PEPTIDE - Abnormal; Notable for the following:    B Natriuretic Peptide 674.3 (*)    All other components within normal limits  URINE MICROSCOPIC-ADD ON - Abnormal; Notable for the following:    Squamous Epithelial / LPF 0-5 (*)    Bacteria, UA MANY (*)    All other components within normal limits  COMPREHENSIVE METABOLIC PANEL - Abnormal; Notable for the following:    Glucose, Bld 173 (*)    BUN 63 (*)    Creatinine, Ser 2.38 (*)    Calcium 8.2 (*)    Total Protein 4.6 (*)    Albumin 2.6 (*)    Total Bilirubin 1.3 (*)    GFR calc non Af Amer 22 (*)    GFR calc Af Amer 26 (*)    All other components within normal limits  CBC - Abnormal; Notable for the following:    WBC 1.9 (*)    RBC 2.65 (*)    Hemoglobin 7.5 (*)    HCT 23.5 (*)    RDW 18.0 (*)    Platelets 20 (*)    All other components within normal limits  URINE CULTURE  CULTURE, BLOOD (ROUTINE X 2)  CULTURE, BLOOD (ROUTINE X 2)  LACTIC ACID, PLASMA  LACTIC ACID, PLASMA  TSH  I-STAT TROPOININ, ED    Imaging Review Dg Chest 2 View  05/17/2015  CLINICAL DATA:  82 year old male with shortness of breath and back pain EXAM: CHEST  2 VIEW COMPARISON:  Radiograph dated 05/09/2015 and CT dated 04/12/2014 FINDINGS: Two views of the chest do not demonstrate a focal consolidation. There is no pleural effusion or pneumothorax. Stable cardiac silhouette. Aortic valve replacement. Bilateral hilar calcified granuloma as well as small scattered pulmonary  calcified nodules. No acute osseous pathology. Upper abdominal surgical clips. IMPRESSION: No active cardiopulmonary disease. Electronically Signed   By: Anner Crete M.D.   On: 05/17/2015 23:35   I have personally reviewed and evaluated these  lab results as part of my medical decision-making.   EKG Interpretation   Date/Time:  Wednesday May 18 2015 03:27:40 EDT Ventricular Rate:  97 PR Interval:  151 QRS Duration: 128 QT Interval:    QTC Calculation:   R Axis:   -48 Text Interpretation:  Age not entered, assumed to be  82 years old for  purpose of ECG interpretation Sinus or ectopic atrial tachycardia  Ventricular premature complex Nonspecific IVCD with LAD LVH with secondary  repolarization abnormality tachycardia new from previous Confirmed by  Margene Cherian MD, [place] [id number]) on 05/18/2015 3:50:39 AM      MDM   Final diagnoses:  UTI (lower urinary tract infection)  Acute on chronic congestive heart failure,  unspecified congestive heart failure type ([place])   Pt w/ recent hospitalization for pyelonephritis p/w sudden onset of R flank pain associated w/ vomiting, mild dysuria. On exam, he was chronically ill appearing but awake and alert, comfortable and in NAD. VS notable for T 98.2, HR 91, BP 90s/50s with MAP near 65. Family noted that his BP is normally low. He had no abdominal or CVA tenderness. Obtained above lab work including blood and urine cultures, lactate as well as chest x-ray. Chest x-ray negative for acute process. Labs show UA consistent with infection, BNP elevated at 674, normal lactate, creatinine 2.2 which is not significantly different from the patient's baseline. CBC shows WBC 2.3, hemoglobin 8.8, platelets 26,000. Family reports that he chronically has pancytopenia and this was demonstrated on hospitalization lab work in [place]. Ordered ceftriaxone but held on fluid bolus given elevated BNP. Obtained non-contrast CT to evaluate for renal stone. CT shows chronic  findings suggestive of liver failure, unchanged from previous; no acute process of kidneys. The patient has remained afebrile for several hours in ED, although his BP continues to be borderline low. Discussed stepdown admission with triad hospitalist, Dr. Loleta Books, and pt admitted for further care.  I personally performed the services described in this documentation, which was scribed in my presence. The recorded information has been reviewed and is accurate.     Walter Iles, MD 05/18/15 [phone]

## 2015-05-18 ENCOUNTER — Encounter (HOSPITAL_COMMUNITY): Payer: Self-pay | Admitting: Family Medicine

## 2015-05-18 ENCOUNTER — Other Ambulatory Visit: Payer: Self-pay

## 2015-05-18 ENCOUNTER — Emergency Department (HOSPITAL_COMMUNITY): Payer: Commercial Managed Care - HMO

## 2015-05-18 DIAGNOSIS — Z87440 Personal history of urinary (tract) infections: Secondary | ICD-10-CM | POA: Diagnosis not present

## 2015-05-18 DIAGNOSIS — K219 Gastro-esophageal reflux disease without esophagitis: Secondary | ICD-10-CM | POA: Diagnosis present

## 2015-05-18 DIAGNOSIS — R0902 Hypoxemia: Secondary | ICD-10-CM | POA: Diagnosis present

## 2015-05-18 DIAGNOSIS — I5033 Acute on chronic diastolic (congestive) heart failure: Secondary | ICD-10-CM | POA: Diagnosis present

## 2015-05-18 DIAGNOSIS — I130 Hypertensive heart and chronic kidney disease with heart failure and stage 1 through stage 4 chronic kidney disease, or unspecified chronic kidney disease: Secondary | ICD-10-CM | POA: Diagnosis present

## 2015-05-18 DIAGNOSIS — E039 Hypothyroidism, unspecified: Secondary | ICD-10-CM | POA: Diagnosis present

## 2015-05-18 DIAGNOSIS — Z801 Family history of malignant neoplasm of trachea, bronchus and lung: Secondary | ICD-10-CM | POA: Diagnosis not present

## 2015-05-18 DIAGNOSIS — Z800 Family history of malignant neoplasm of digestive organs: Secondary | ICD-10-CM | POA: Diagnosis not present

## 2015-05-18 DIAGNOSIS — E1122 Type 2 diabetes mellitus with diabetic chronic kidney disease: Secondary | ICD-10-CM | POA: Diagnosis present

## 2015-05-18 DIAGNOSIS — R161 Splenomegaly, not elsewhere classified: Secondary | ICD-10-CM | POA: Diagnosis present

## 2015-05-18 DIAGNOSIS — A419 Sepsis, unspecified organism: Secondary | ICD-10-CM | POA: Diagnosis not present

## 2015-05-18 DIAGNOSIS — Z79899 Other long term (current) drug therapy: Secondary | ICD-10-CM | POA: Diagnosis not present

## 2015-05-18 DIAGNOSIS — F329 Major depressive disorder, single episode, unspecified: Secondary | ICD-10-CM

## 2015-05-18 DIAGNOSIS — I959 Hypotension, unspecified: Secondary | ICD-10-CM | POA: Diagnosis present

## 2015-05-18 DIAGNOSIS — Z87442 Personal history of urinary calculi: Secondary | ICD-10-CM | POA: Diagnosis not present

## 2015-05-18 DIAGNOSIS — R6883 Chills (without fever): Secondary | ICD-10-CM | POA: Diagnosis not present

## 2015-05-18 DIAGNOSIS — A4151 Sepsis due to Escherichia coli [E. coli]: Secondary | ICD-10-CM | POA: Diagnosis present

## 2015-05-18 DIAGNOSIS — N390 Urinary tract infection, site not specified: Secondary | ICD-10-CM | POA: Diagnosis present

## 2015-05-18 DIAGNOSIS — D61818 Other pancytopenia: Secondary | ICD-10-CM | POA: Diagnosis present

## 2015-05-18 DIAGNOSIS — Z85819 Personal history of malignant neoplasm of unspecified site of lip, oral cavity, and pharynx: Secondary | ICD-10-CM | POA: Diagnosis not present

## 2015-05-18 DIAGNOSIS — Z823 Family history of stroke: Secondary | ICD-10-CM | POA: Diagnosis not present

## 2015-05-18 DIAGNOSIS — R6521 Severe sepsis with septic shock: Secondary | ICD-10-CM

## 2015-05-18 DIAGNOSIS — N183 Chronic kidney disease, stage 3 (moderate): Secondary | ICD-10-CM | POA: Diagnosis present

## 2015-05-18 DIAGNOSIS — I10 Essential (primary) hypertension: Secondary | ICD-10-CM

## 2015-05-18 DIAGNOSIS — N170 Acute kidney failure with tubular necrosis: Secondary | ICD-10-CM | POA: Diagnosis present

## 2015-05-18 DIAGNOSIS — R109 Unspecified abdominal pain: Secondary | ICD-10-CM | POA: Diagnosis not present

## 2015-05-18 DIAGNOSIS — Z8249 Family history of ischemic heart disease and other diseases of the circulatory system: Secondary | ICD-10-CM | POA: Diagnosis not present

## 2015-05-18 DIAGNOSIS — M109 Gout, unspecified: Secondary | ICD-10-CM | POA: Diagnosis present

## 2015-05-18 DIAGNOSIS — Z87891 Personal history of nicotine dependence: Secondary | ICD-10-CM | POA: Diagnosis not present

## 2015-05-18 DIAGNOSIS — Z8673 Personal history of transient ischemic attack (TIA), and cerebral infarction without residual deficits: Secondary | ICD-10-CM | POA: Diagnosis not present

## 2015-05-18 DIAGNOSIS — L989 Disorder of the skin and subcutaneous tissue, unspecified: Secondary | ICD-10-CM | POA: Diagnosis present

## 2015-05-18 DIAGNOSIS — I480 Paroxysmal atrial fibrillation: Secondary | ICD-10-CM | POA: Diagnosis not present

## 2015-05-18 DIAGNOSIS — E43 Unspecified severe protein-calorie malnutrition: Secondary | ICD-10-CM | POA: Diagnosis present

## 2015-05-18 DIAGNOSIS — N10 Acute tubulo-interstitial nephritis: Secondary | ICD-10-CM | POA: Diagnosis present

## 2015-05-18 DIAGNOSIS — Z952 Presence of prosthetic heart valve: Secondary | ICD-10-CM | POA: Diagnosis not present

## 2015-05-18 DIAGNOSIS — E7800 Pure hypercholesterolemia, unspecified: Secondary | ICD-10-CM | POA: Diagnosis present

## 2015-05-18 LAB — COMPREHENSIVE METABOLIC PANEL
ALBUMIN: 2.6 g/dL — AB (ref 3.5–5.0)
ALT: 25 U/L (ref 17–63)
ANION GAP: 13 (ref 5–15)
AST: 29 U/L (ref 15–41)
Alkaline Phosphatase: 85 U/L (ref 38–126)
BILIRUBIN TOTAL: 1.3 mg/dL — AB (ref 0.3–1.2)
BUN: 63 mg/dL — ABNORMAL HIGH (ref 6–20)
CHLORIDE: 103 mmol/L (ref 101–111)
CO2: 25 mmol/L (ref 22–32)
Calcium: 8.2 mg/dL — ABNORMAL LOW (ref 8.9–10.3)
Creatinine, Ser: 2.38 mg/dL — ABNORMAL HIGH (ref 0.61–1.24)
GFR calc Af Amer: 26 mL/min — ABNORMAL LOW (ref >60)
GFR calc non Af Amer: 22 mL/min — ABNORMAL LOW (ref >60)
GLUCOSE: 173 mg/dL — AB (ref 65–99)
POTASSIUM: 3.7 mmol/L (ref 3.5–5.1)
SODIUM: 141 mmol/L (ref 135–145)
TOTAL PROTEIN: 4.6 g/dL — AB (ref 6.5–8.1)

## 2015-05-18 LAB — LACTIC ACID, PLASMA
LACTIC ACID, VENOUS: 1.5 mmol/L (ref 0.5–2.0)
LACTIC ACID, VENOUS: 2.9 mmol/L — AB (ref 0.5–2.0)
Lactic Acid, Venous: 1.3 mmol/L (ref 0.5–2.0)
Lactic Acid, Venous: 2.7 mmol/L (ref 0.5–2.0)

## 2015-05-18 LAB — CBC WITH DIFFERENTIAL/PLATELET
BASOS ABS: 0 10*3/uL (ref 0.0–0.1)
BASOS PCT: 0 %
EOS ABS: 0 10*3/uL (ref 0.0–0.7)
Eosinophils Relative: 0 %
HCT: 27.8 % — ABNORMAL LOW (ref 39.0–52.0)
Hemoglobin: 8.8 g/dL — ABNORMAL LOW (ref 13.0–17.0)
Lymphocytes Relative: 6 %
Lymphs Abs: 0.1 10*3/uL — ABNORMAL LOW (ref 0.7–4.0)
MCH: 28.2 pg (ref 26.0–34.0)
MCHC: 31.7 g/dL (ref 30.0–36.0)
MCV: 89.1 fL (ref 78.0–100.0)
Monocytes Absolute: 0.1 10*3/uL (ref 0.1–1.0)
Monocytes Relative: 5 %
NEUTROS PCT: 88 %
Neutro Abs: 2 10*3/uL (ref 1.7–7.7)
PLATELETS: 26 10*3/uL — AB (ref 150–400)
RBC: 3.12 MIL/uL — AB (ref 4.22–5.81)
RDW: 17.9 % — ABNORMAL HIGH (ref 11.5–15.5)
WBC: 2.3 10*3/uL — AB (ref 4.0–10.5)

## 2015-05-18 LAB — CBC
HEMATOCRIT: 23.5 % — AB (ref 39.0–52.0)
HEMATOCRIT: 26.7 % — AB (ref 39.0–52.0)
HEMOGLOBIN: 7.5 g/dL — AB (ref 13.0–17.0)
Hemoglobin: 8.6 g/dL — ABNORMAL LOW (ref 13.0–17.0)
MCH: 28.3 pg (ref 26.0–34.0)
MCH: 29.2 pg (ref 26.0–34.0)
MCHC: 31.9 g/dL (ref 30.0–36.0)
MCHC: 32.2 g/dL (ref 30.0–36.0)
MCV: 88.7 fL (ref 78.0–100.0)
MCV: 90.5 fL (ref 78.0–100.0)
PLATELETS: 19 10*3/uL — AB (ref 150–400)
Platelets: 20 10*3/uL — CL (ref 150–400)
RBC: 2.65 MIL/uL — ABNORMAL LOW (ref 4.22–5.81)
RBC: 2.95 MIL/uL — ABNORMAL LOW (ref 4.22–5.81)
RDW: 18 % — ABNORMAL HIGH (ref 11.5–15.5)
RDW: 17.8 % — AB (ref 11.5–15.5)
WBC: 1.9 10*3/uL — AB (ref 4.0–10.5)
WBC: 2.2 10*3/uL — ABNORMAL LOW (ref 4.0–10.5)

## 2015-05-18 LAB — TSH: TSH: 2.664 u[IU]/mL (ref 0.350–4.500)

## 2015-05-18 LAB — MRSA PCR SCREENING: MRSA by PCR: NEGATIVE

## 2015-05-18 LAB — I-STAT TROPONIN, ED: Troponin i, poc: 0.06 ng/mL (ref 0.00–0.08)

## 2015-05-18 LAB — CORTISOL: CORTISOL PLASMA: 36.2 ug/dL

## 2015-05-18 LAB — GLUCOSE, CAPILLARY: GLUCOSE-CAPILLARY: 164 mg/dL — AB (ref 65–99)

## 2015-05-18 LAB — BRAIN NATRIURETIC PEPTIDE: B Natriuretic Peptide: 674.3 pg/mL — ABNORMAL HIGH (ref 0.0–100.0)

## 2015-05-18 LAB — PREPARE RBC (CROSSMATCH)

## 2015-05-18 LAB — PROCALCITONIN: PROCALCITONIN: 44.64 ng/mL

## 2015-05-18 MED ORDER — DEXTROSE 5 % IV SOLN
0.0000 ug/min | INTRAVENOUS | Status: DC
  Administered 2015-05-18: 30 ug/min via INTRAVENOUS
  Administered 2015-05-19: 10 ug/min via INTRAVENOUS
  Filled 2015-05-18 (×3): qty 1

## 2015-05-18 MED ORDER — ALLOPURINOL 100 MG PO TABS
100.0000 mg | ORAL_TABLET | Freq: Every day | ORAL | Status: DC
  Administered 2015-05-18 – 2015-05-21 (×4): 100 mg via ORAL
  Filled 2015-05-18 (×4): qty 1

## 2015-05-18 MED ORDER — LEVOTHYROXINE SODIUM 25 MCG PO TABS
25.0000 ug | ORAL_TABLET | Freq: Every day | ORAL | Status: DC
  Administered 2015-05-18 – 2015-05-21 (×4): 25 ug via ORAL
  Filled 2015-05-18 (×4): qty 1

## 2015-05-18 MED ORDER — SODIUM CHLORIDE 0.9 % IV SOLN
Freq: Once | INTRAVENOUS | Status: AC
  Administered 2015-05-18: 13:00:00 via INTRAVENOUS

## 2015-05-18 MED ORDER — ACETAMINOPHEN 325 MG PO TABS: 650.0000 mg | ORAL_TABLET | Freq: Four times a day (QID) | ORAL | Status: DC | PRN

## 2015-05-18 MED ORDER — VANCOMYCIN HCL IN DEXTROSE 1-5 GM/200ML-% IV SOLN: 1000.0000 mg | INTRAVENOUS | Status: DC

## 2015-05-18 MED ORDER — SODIUM CHLORIDE 0.9% FLUSH
3.0000 mL | Freq: Two times a day (BID) | INTRAVENOUS | Status: DC
  Administered 2015-05-18 – 2015-05-21 (×4): 3 mL via INTRAVENOUS

## 2015-05-18 MED ORDER — AMIODARONE HCL 200 MG PO TABS
200.0000 mg | ORAL_TABLET | Freq: Every day | ORAL | Status: DC
  Administered 2015-05-18 – 2015-05-21 (×4): 200 mg via ORAL
  Filled 2015-05-18 (×5): qty 1

## 2015-05-18 MED ORDER — SODIUM CHLORIDE 0.9 % IV BOLUS (SEPSIS)
1000.0000 mL | Freq: Once | INTRAVENOUS | Status: AC
  Administered 2015-05-18: 1000 mL via INTRAVENOUS

## 2015-05-18 MED ORDER — SODIUM CHLORIDE 0.9 % IV BOLUS (SEPSIS)
500.0000 mL | Freq: Once | INTRAVENOUS | Status: AC
  Administered 2015-05-18: 500 mL via INTRAVENOUS

## 2015-05-18 MED ORDER — ACETAMINOPHEN 500 MG PO TABS
1000.0000 mg | ORAL_TABLET | Freq: Once | ORAL | Status: AC
  Administered 2015-05-18: 1000 mg via ORAL
  Filled 2015-05-18: qty 2

## 2015-05-18 MED ORDER — FERROUS SULFATE 325 (65 FE) MG PO TABS
325.0000 mg | ORAL_TABLET | Freq: Two times a day (BID) | ORAL | Status: DC
  Administered 2015-05-18 – 2015-05-21 (×7): 325 mg via ORAL
  Filled 2015-05-18 (×8): qty 1

## 2015-05-18 MED ORDER — CEFEPIME HCL 2 G IJ SOLR
2.0000 g | Freq: Once | INTRAMUSCULAR | Status: AC
  Administered 2015-05-18: 2 g via INTRAVENOUS
  Filled 2015-05-18: qty 2

## 2015-05-18 MED ORDER — VANCOMYCIN HCL 10 G IV SOLR
1500.0000 mg | Freq: Once | INTRAVENOUS | Status: AC
  Administered 2015-05-18: 1500 mg via INTRAVENOUS
  Filled 2015-05-18: qty 1500

## 2015-05-18 MED ORDER — CEFTRIAXONE SODIUM 1 G IJ SOLR
1.0000 g | Freq: Once | INTRAMUSCULAR | Status: DC
  Filled 2015-05-18: qty 10

## 2015-05-18 MED ORDER — ACETAMINOPHEN 650 MG RE SUPP: 650.0000 mg | Freq: Four times a day (QID) | RECTAL | Status: DC | PRN

## 2015-05-18 MED ORDER — SERTRALINE HCL 50 MG PO TABS
50.0000 mg | ORAL_TABLET | Freq: Every day | ORAL | Status: DC
  Administered 2015-05-18 – 2015-05-20 (×3): 50 mg via ORAL
  Filled 2015-05-18 (×4): qty 1

## 2015-05-18 MED ORDER — SODIUM CHLORIDE 0.9 % IV BOLUS (SEPSIS)
250.0000 mL | Freq: Once | INTRAVENOUS | Status: AC
  Administered 2015-05-18: 250 mL via INTRAVENOUS

## 2015-05-18 MED ORDER — DEXTROSE 5 % IV SOLN
2.0000 g | INTRAVENOUS | Status: DC
  Administered 2015-05-18: 2 g via INTRAVENOUS
  Filled 2015-05-18: qty 2

## 2015-05-18 NOTE — Care Management Note (Signed)
Case Management Note  Patient Details  Name: FARHAAN CADA MRN: [id number] Date of Birth: Aug 30, 1922  Subjective/Objective:                  82 y.o. male brought in by ambulance, who presents to the Emergency Department complaining of sudden onset, constant, right flank pain that began earlier tonight.82 y.o. male brought in by ambulance, who presents to the Emergency Department complaining of sudden onset, constant, right flank pain that began last night.//  Action/Plan: Follow for disposition needs.   Expected Discharge Date:        05/19/15          Expected Discharge Plan:  Home/Self Care  In-House Referral:  NA  Discharge planning Services  NA  Post Acute Care Choice:  NA Choice offered to:  NA  DME Arranged:  N/A DME Agency:  NA  HH Arranged:  NA HH Agency:  NA  Status of Service:  In process, will continue to follow  Medicare Important Message Given:    Date Medicare IM Given:    Medicare IM give by:    Date Additional Medicare IM Given:    Additional Medicare Important Message give by:     If discussed at [place] of Stay Meetings, dates discussed:    Additional Comments:  Fuller Mandril, RN 05/18/2015, 2:20 PM

## 2015-05-18 NOTE — Progress Notes (Signed)
82 year old presented with right-sided flank pain chills and subjective fevers Found to be hypotensive in the ED with normal lactate, and numerous white blood cells in urinalysis. He has chronic pancytopenia attributed to lymphoproliferative disorder. He had no other evidence of organ failure Past medical history- CK D, TAVR 05/2014   On exam- alert and interactive, MEP 60, abdomen soft and nontender, chest clear, S1-S2 regular, skin ecchymosis, no edema  Sepsis repeat assessment completed- he received 2.5 L of fluid and 1 unit of PRBC  Labs-lactate 1.3 Pancytopenia  CT abdomen did not show any hydronephrosis or renal calculi  Impression/plan Septic shock- persistent hypotension after fluid resuscitation, however he looks "clinically okay". We'll repeat lactate-if rising we will consider starting him on pressors Due to severe thrombocytopenia, likely best to avoid central venous access Agree with broad-spectrum antibiotics for pyelonephritis and await culture data  His renal function seems to be at baseline  Kara Mead MD. FCCP. [facility] Pager [phone] If no response call [phone]   05/18/2015

## 2015-05-18 NOTE — Progress Notes (Signed)
PROGRESS NOTE                                                                                                                                                                                                             Patient Demographics:    [name], is a 82 y.o. male, DOB - 1922-12-29, [id number]  Admit date - 05/17/2015   Admitting Physician No admitting provider for patient encounter.  Outpatient Primary MD for the patient is Binnie Rail, MD  LOS - 0    Chief Complaint  Patient presents with  . Back Pain  . Shortness of Breath       Brief Narrative  82 y.o. male with a past medical history significant for lymphoproliferative disorder with severe pancytopenia followed by Dr. Benay Spice, diastolic CHF, chronic EF [id number], hypothyroidism, pAF not on warfarin, CKD and aortic stenosis s/p TAVR in 2016 who presents with flank pain, Patient afebrile, but hypotensive, tachycardic and slightly hypoxic, with worsening renal function, admitted for pyelonephritis, patient with known chronic pancytopenia.    Subjective:    [name] today has, No headache, No chest pain, No abdominal pain - No Nausea,Report generalized weakness.  Assessment  & Plan :    Principal Problem:   UTI (lower urinary tract infection) Active Problems:   Essential hypertension   Other pancytopenia (HCC)   Hypothyroidism   Protein-calorie malnutrition, severe (HCC)   PAF (paroxysmal atrial fibrillation) (HCC)   CKD (chronic kidney disease), stage III   Depression   Hypotension   Acute on chronic diastolic CHF (congestive heart failure) (HCC)  Sepsis on the setting of acute pyelonephritis - This is with chills, emesis, flank pain and polyuria, urinalysis consistent with UTI.  - CT abdomen and pelvis with no stone or obstruction - Recent hospitalization at [place] secondary to sepsis from UTI - Continue with IV cefepime, given patient still hypotensive will add  vancomycin - Follow on lactic acid, pro-calcitonin - Blood pressure remains on the lower side, will continue with fluid boluses as needed, check pro-calcitonin, check creatinine and random cortisol level, would consider trial of stress dose steroids.  Acute on chronic diastolic CHF - Patient with evidence of volume overload, giving significant lower extremity edema. - Getting soft blood pressure will continue to hold Lasix.  Chronic Pancytopenia - Discussed with Dr. Learta Codding, bleeding in  the setting of chronic lymphoproliferative disorder, splenomegaly versus liver cirrhosis - Transfuse as needed, received 1 unit PRBC today and hemoglobin of 7.5, platelet count is low, but no evidence of bleed, no indication for platelet transfusion.  HTN  - Patient is currently hypotensive, so continue to hold propranolol and amlodipine   Hypothyroidism - Continue levothyroxine   Depression [name] recently died last Jan 08, 2023, has few family left in [place]. Has had suicidal ideation in the past few years, not suicidal at admission, but some passive suicidality this past few weeks. - Continue sertraline  Severe PCM:  - Continue Ensure   CKD stage III, baseline Cr 1.9-2.2:  - Stable  PAF: - CHADS2-Vasc 4-5. Not on warfarin because of pancytopenia.  - Continue amiodarone  Right inguinal hernia: - Monitor for now    Code Status : DNR  Family Communication  : Friend at bedside  Disposition Plan  : Monitor on stepdown  Consults  :  PCCM  Procedures  : none  DVT Prophylaxis  :   SCDs   Lab Results  Component Value Date   PLT 20* 05/18/2015    Antibiotics  :    Anti-infectives    Start     Dose/Rate Route Frequency Ordered Stop   05/19/15 1500  vancomycin (VANCOCIN) IVPB 1000 mg/200 mL premix     1,000 mg 200 mL/hr over 60 Minutes Intravenous Every 24 hours 05/18/15 1425     05/18/15 2200  ceFEPIme (MAXIPIME) 2 g in dextrose 5 % 50 mL IVPB     2 g 100 mL/hr  over 30 Minutes Intravenous Every 24 hours 05/18/15 0238     05/18/15 1430  vancomycin (VANCOCIN) 1,500 mg in sodium chloride 0.9 % 500 mL IVPB     1,500 mg 250 mL/hr over 120 Minutes Intravenous  Once 05/18/15 1425     05/18/15 0245  ceFEPIme (MAXIPIME) 2 g in dextrose 5 % 50 mL IVPB     2 g 100 mL/hr over 30 Minutes Intravenous  Once 05/18/15 0236 05/18/15 0315   05/18/15 0130  cefTRIAXone (ROCEPHIN) 1 g in dextrose 5 % 50 mL IVPB  Status:  Discontinued     1 g 100 mL/hr over 30 Minutes Intravenous  Once 05/18/15 0127 05/18/15 0236        Objective:   Filed Vitals:   05/18/15 1330 05/18/15 1345 05/18/15 1412 05/18/15 1415  BP: 82/53 82/55 80/48  74/45  Pulse: 94 87 93 84  Temp:   98.2 F (36.8 C)   TempSrc:   Oral   Resp: 13 15 14 12   Weight:      SpO2: 94% 93% 99% 98%    Wt Readings from Last 3 Encounters:  05/18/15 81.375 kg (179 lb 6.4 oz)  05/16/15 81.375 kg (179 lb 6.4 oz)  05/16/15 81.92 kg (180 lb 9.6 oz)     Intake/Output Summary (Last 24 hours) at 05/18/15 1425 Last data filed at 05/18/15 1412  Gross per 24 hour  Intake    286 ml  Output    100 ml  Net    186 ml     Physical Exam  Awake Alert, Oriented X 3, Supple Neck,No JVD,  Symmetrical Chest wall movement, Good air movement bilaterally, No wheezing  No Gallops,Rubs or new Murmurs, No Parasternal Heave +ve B.Sounds, Abd Soft,significant right CVA tenderness, No rebound - guarding or rigidity. No Cyanosis,, bruising, +2 edema bilaterally   Data Review:    CBC  Recent Labs  Lab 05/16/15 0831 05/17/15 2329 05/18/15 0442  WBC 3.5* 2.3* 1.9*  HGB 9.7* 8.8* 7.5*  HCT 30.0* 27.8* 23.5*  PLT 30* 26* 20*  MCV 89.8 89.1 88.7  MCH 29.0 28.2 28.3  MCHC 32.3 31.7 31.9  RDW 17.9* 17.9* 18.0*  LYMPHSABS 0.5* 0.1*  --   MONOABS 0.2 0.1  --   EOSABS 0.0 0.0  --   BASOSABS 0.0 0.0  --     Chemistries   Recent Labs Lab 05/13/15 0935 05/17/15 2329 05/18/15 0442  NA 135 140 141  K 4.0 3.8 3.7   CL 97 103 103  CO2 29 25 25   GLUCOSE 144* 186* 173*  BUN 56* 60* 63*  CREATININE 1.97* 2.20* 2.38*  CALCIUM 8.9 8.9 8.2*  AST 28  --  29  ALT 23  --  25  ALKPHOS 101  --  85  BILITOT 0.8  --  1.3*   ------------------------------------------------------------------------------------------------------------------ No results for input(s): CHOL, HDL, LDLCALC, TRIG, CHOLHDL, LDLDIRECT in the last 72 hours.  Lab Results  Component Value Date   HGBA1C 5.7 05/09/2015   ------------------------------------------------------------------------------------------------------------------  Recent Labs  05/18/15 0442  TSH 2.664   ------------------------------------------------------------------------------------------------------------------ No results for input(s): VITAMINB12, FOLATE, FERRITIN, TIBC, IRON, RETICCTPCT in the last 72 hours.  Coagulation profile No results for input(s): INR, PROTIME in the last 168 hours.  No results for input(s): DDIMER in the last 72 hours.  Cardiac Enzymes No results for input(s): CKMB, TROPONINI, MYOGLOBIN in the last 168 hours.  Invalid input(s): CK ------------------------------------------------------------------------------------------------------------------    Component Value Date/Time   BNP 674.3* 05/17/2015 2329    Inpatient Medications  Scheduled Meds: . allopurinol  100 mg Oral Daily  . amiodarone  200 mg Oral Daily  . ferrous sulfate  325 mg Oral BID WC  . levothyroxine  25 mcg Oral QAC breakfast  . sertraline  50 mg Oral QHS  . sodium chloride flush  3 mL Intravenous Q12H   Continuous Infusions: . ceFEPime (MAXIPIME) IV    . vancomycin    . [START ON 05/19/2015] vancomycin     PRN Meds:.acetaminophen **OR** acetaminophen  Micro Results No results found for this or any previous visit (from the past 240 hour(s)).  Radiology Reports Dg Chest 2 View  05/17/2015  CLINICAL DATA:  82 year old male with shortness of breath and  back pain EXAM: CHEST  2 VIEW COMPARISON:  Radiograph dated 05/09/2015 and CT dated 04/12/2014 FINDINGS: Two views of the chest do not demonstrate a focal consolidation. There is no pleural effusion or pneumothorax. Stable cardiac silhouette. Aortic valve replacement. Bilateral hilar calcified granuloma as well as small scattered pulmonary calcified nodules. No acute osseous pathology. Upper abdominal surgical clips. IMPRESSION: No active cardiopulmonary disease. Electronically Signed   By: Anner Crete M.D.   On: 05/17/2015 23:35   Dg Chest 2 View  05/09/2015  CLINICAL DATA:  Cough for 1 week, low-grade fever EXAM: CHEST  2 VIEW COMPARISON:  10/04/2014 FINDINGS: Cardiac shadow is at the upper limits of normal in size but stable. Changes of prior granulomatous disease are noted. No focal infiltrate or sizable effusion is seen. Changes of prior aortic valve replacement are seen. No acute bony abnormality is noted. IMPRESSION: No active cardiopulmonary disease. Electronically Signed   By: Inez Catalina M.D.   On: 05/09/2015 14:17   Ct Renal Stone Study  05/18/2015  CLINICAL DATA:  Right flank pain and vomiting tonight. EXAM: CT ABDOMEN AND PELVIS WITHOUT CONTRAST TECHNIQUE: Multidetector CT imaging  of the abdomen and pelvis was performed following the standard protocol without IV contrast. COMPARISON:  None. FINDINGS: There are no urinary calculi. There is no hydronephrosis or ureteral dilatation. Both kidneys are moderately atrophic. There are several cysts bilaterally, not significantly changed from 04/22/2014. There is massive splenomegaly, or 23 cm craniocaudal. This is unchanged. There are unremarkable unenhanced appearances of the liver, bile ducts, pancreas and adrenals. There are multiple upper abdominal varices. There is small volume ascites, unchanged. Bowel is unremarkable. No acute inflammatory changes are evident in the abdomen or pelvis. There is no significant abnormality in the lower chest.  There is no significant musculoskeletal lesion. IMPRESSION: Small volume ascites. Massive splenomegaly. Upper abdominal varices. These findings are not significantly changed from 04/22/2014. Kidneys appear atrophic but no evidence of calculi and no evidence of obstructive uropathy. Electronically Signed   By: Andreas Newport M.D.   On: 05/18/2015 01:05    Time Spent in minutes  No charge   Waldron Labs, DAWOOD M.D on 05/18/2015 at 2:25 PM  Between 7am to 7pm - Pager - [phone]  After 7pm go to [email or web address] - password [facility]  [facility] -  Office  [phone]

## 2015-05-18 NOTE — Progress Notes (Signed)
Sepsis - Repeat Assessment  Performed at:    5:19 AM   Vitals     Blood pressure 116/81, pulse 84, temperature 97.9 F (36.6 C), temperature source Oral, resp. rate 15, SpO2 99 %. Heart:     Irregular rate and rhythm and Tachycardic Lungs:    CTA Capillary Refill:   <2 sec Peripheral Pulse:   Normal Skin:     Normal Color, no mottling Gen:      Patient uncomfortable, with chills  Patient's BP dropped despite initial small fluid bolus, and I suspect now that he is actually septic.  He is still mentating and oriented, and BP and pulse responded well to 1L NS bolus, so I will repeat to round out 30 cc/kg bolus. Culture data was collected. Cefepime has been administered for hospital-associated UTI.

## 2015-05-18 NOTE — ED Notes (Signed)
Dr. Waldron Labs at bedside to see patient.

## 2015-05-18 NOTE — Progress Notes (Addendum)
Pharmacy Antibiotic Note  [name] is a 82 y.o. male admitted on 05/17/2015 with UTI.  Pharmacy has been consulted for Cefepime dosing. WBC low at 2.3, noted renal dysfunction, other labs reviewed.   Plan: -Cefepime 2g IV q24h -F/U urine culture for directed therapy  Temp (24hrs), Avg:98.8 F (37.1 C), Min:98.8 F (37.1 C), Max:98.8 F (37.1 C)   Recent Labs Lab 05/13/15 0935 05/16/15 0831 05/17/15 2329  WBC  --  3.5* 2.3*  CREATININE 1.97*  --  2.20*    Estimated Creatinine Clearance: 22.6 mL/min (by C-G formula based on Cr of 2.2).    Allergies  Allergen Reactions  . Penicillins Other (See Comments)    Does not remember reaction (~year 1950)  . Neomycin-Bacitracin Zn-Polymyx Other (See Comments)    ? Reaction (thinks he remembers redness)   Narda Bonds 05/18/2015 2:38 AM  Addendum: Adding vancomycin. Vanc 1500mg  IV x 1 then 1gm IV Q24H  Salome Arnt, PharmD, BCPS Pager # [phone] 05/18/2015 2:25 PM

## 2015-05-18 NOTE — H&P (Signed)
History and Physical  Patient Name: [name]     [id number]    DOB: 1922/02/18    DOA: 05/17/2015 PCP: Binnie Rail, MD   Patient coming from: Home  Chief Complaint: Flank pain  HPI: [name] is a 82 y.o. male with a past medical history significant for lymphoproliferative disorder with severe pancytopenia followed by Dr. Benay Spice, diastolic CHF, chronic EF [id number], hypothyroidism, pAF not on warfarin, CKD and aortic stenosis s/p TAVR in 2016 who presents with flank pain.  Patient was admitted in [facility] last month for UTI sepsis.  He thinks he did not have a central line, but was admitted for about 1 week total to the ICU.  He has been back home for about two weeks, going about his usual routine.  Has had persistent leg swelling (admitted to hospital in [facility] with weight 173, discharged at 194), and saw his PCP two days ago who started metolazone.    Now, last ngiht, the patient started to develop RIGHT flank pain after going out to supper with a friend.  Later, he had chills/shakes in bed, kept having to urinate, and every time he breathed he had "kidney pain".  Today, this continued, was accompanied by NBNB emesis and so he came to the ER.  In the ED, he was afebrile, tachycardic, BP 85-90/50, slightly hypoxic.  Na 140, K 3.8, Cr 2.2 (at baseline), WBC 2.3K, Hgb 8.8, platelets 26K (pancytopenia, all at baseline).  UA showed pyuria, hematuria, nitrites and bacteria.  BNP was moderately elevated.  CXR was clear without edema or focal consolidation.  CT renal protocol was without stone or obstruction.  Blood cultures were obtained and antibiotics were administered.  He did not appear septic.  TRH were asked to evaluate for admission.      Review of Systems:  All other systems negative except as just noted or noted in the history of present illness.    Past Medical History  Diagnosis Date  . Stroke ([place])   . PAF (paroxysmal atrial fibrillation) (HCC)     a. amiodarone  therapy; not felt to be a candidate for anticoagulation with pancytopenia. b. Back in AF 09/2014 - decision was made to pursue rate control only.  . Bradycardia   . Aortic stenosis     a. Previously severe -> s/p minimally invasive tissue AVR with Dr. Evelina Dun at [facility] 01/2011 (pre-AVR cath with no obs CAD);  b. 05/2014 s/p TAVR (23 mm Edwards Sapien 3).  . Hypertension   . Pancytopenia ([place])     a. possible chronic lymphoproliferative disorder or splenic lymphoma.  . Action tremor   . Thrombocytopenia ([place])     Dr. Benay Spice  . Colon polyp   . Anxiety   . Splenomegaly   . Gout   . GERD (gastroesophageal reflux disease)   . Hypothyroidism   . Leukopenia     Chronic pancytopenia  . Degenerative disc disease   . Joint effusion, knee     left knee  . Synovial cyst of popliteal space   . Cellulitis of left leg 10/11-16/2012  . CKD (chronic kidney disease), stage III   . Chronic diastolic CHF (congestive heart failure) ([place])     a. 12/15/2012 TEE: EF 60-65%, no veg.  . Bacteremia     a. 12/2012 - S bovis;  b. 12/2012 TEE w/o veg;  c. Seeing ID->Rocephin therapy extended to 01/25/2013 via PICC for possible endocarditis (No veg on TEE).  . High  cholesterol   . Diabetes mellitus     "borderline" (01/05/2013)  . History of blood transfusion 01/2011; 11/2012  . H/O hiatal hernia   . Throat cancer ([place])     s/p lasered  . Diverticulosis   . S/P TAVR (transcatheter aortic valve replacement)     a. 05/14/2014 TAVR: 23 mm Edwards Sapien 3 transcatheter heart valve placed valve-in-valve for prosthetic valve dysfunction via open right transfemoral approach  . QT prolongation   . Hypotension     Past Surgical History  Procedure Laterality Date  . Cholecystectomy    . Joint effusion      left knee  . Aortic valve replacement  01/23/2011    via minimally invasive approach per Dr Evelina Dun, [facility]  . Tee without cardioversion N/A 12/15/2012    Procedure: TRANSESOPHAGEAL ECHOCARDIOGRAM (TEE);  Surgeon:  [name], MD;  Location: [facility] ENDOSCOPY;  Service: Cardiovascular;  Laterality: N/A;  . Cardiac valve replacement    . Inguinal hernia repair Right   . Excisional hemorrhoidectomy    . Cardiac catheterization    . Surgery scrotal / testicular      "removed one" (01/05/2013)  . 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 Tee without cardioversion N/A 03/26/2014    Procedure: TRANSESOPHAGEAL ECHOCARDIOGRAM (TEE);  Surgeon: Josue Hector, MD;  Location[facility] END[facility]COPY;  Service: Cardiovascular;  Laterality: N/A;  . Transcatheter aortic valve replacement, transfemoral Right 05/14/2014    Procedure: TRANSCATHETER AORTIC VALVE REPLACEMENT, TRANSFEMORAL;  Surgeon: [name], MD;  Location: [place];  Service: Open Heart Surgery;  Laterality: Right;  . Tee without cardioversion N/A 05/14/2014    Procedure: TRANSESOPHAGEAL ECHOCARDIOGRAM (TEE);  Surgeon: [name], MD;  Location: [place];  Service: Open Heart Surgery;  Laterality: N/A;    Social History: Patient lives alone here in [place], his wife passed away this past 01/22/2023.  The patient walks unassisted.  He still drives. He has no dementia.  He has a daughter in [place], one in [place], and a youngest daughter in [place].  He is a remote former smoker.  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  No alcohol.  Allergies  Allergen Reactions  . Penicillins Other (See Comments)    Does not remember reaction (~year 1950)  . Neomycin-Bacitracin Zn-Polymyx Other (See Comments)    ? Reaction (thinks he remembers redness)    Family history: family history includes Cancer in his brother; Colon cancer in his other; Heart disease in his father; Lung cancer in his mother; Stroke in his other.  Prior to Admission medications   Medication Sig Start Date End Date Taking?  Authorizing Provider  allopurinol (ZYLOPRIM) 100 MG tablet TAKE 1 TABLET EVERY DAY 03/31/15  Yes Binnie Rail, MD  amiodarone (PACERONE) 200 MG tablet Take 1 tablet by mouth daily 03/23/15  Yes [name], MD  amLODipine (NORVASC) 5 MG tablet Take 5 mg by mouth daily. 03/10/15  Yes Historical Provider, MD  Cholecalciferol (VITAMIN D) 1000 UNITS capsule Take 1 capsule (1,000 Units total) by mouth daily. 01/05/11  Yes Renella Cunas, MD  clonazePAM (KLONOPIN) 0.5 MG tablet TAKE ONE-HALF TABLET BY MOUTH AS NEEDED IF  YOU  WAKE  UP  EARLY 12/06/14  Yes Golden Circle, FNP  colchicine 0.6 MG tablet  Take 0.6 mg by mouth daily as needed (gout flares).  04/03/11  Yes Renella Cunas, MD  ferrous sulfate 325 (65 FE) MG tablet Take 325 mg by mouth 2 (two) times daily with a meal.   Yes Historical Provider, MD  fish oil-omega-3 fatty acids 1000 MG capsule Take 1 g by mouth daily.     Yes Historical Provider, MD  Flaxseed, Linseed, (FLAX SEED OIL) 1000 MG CAPS Take 1 capsule by mouth daily.    Yes Historical Provider, MD  furosemide (LASIX) 80 MG tablet Take 40-80 mg by mouth 2 (two) times daily. Patient taking 80 mg in the morning and 40 mg in the evening   Yes Historical Provider, MD  levothyroxine (SYNTHROID, LEVOTHROID) 25 MCG tablet Take 25 mcg by mouth daily before breakfast.   Yes Historical Provider, MD  metolazone (ZAROXOLYN) 2.5 MG tablet Take 1 tablet (2.5 mg total) by mouth daily as needed (edema). 12/22/14  Yes [name], MD  Multiple Vitamin (MULTIVITAMIN) tablet Take 1 tablet by mouth daily.     Yes Historical Provider, MD  mupirocin ointment (BACTROBAN) 2 % Place 1 application into the nose 2 (two) times daily. 05/09/15  Yes Binnie Rail, MD  omeprazole (PRILOSEC) 20 MG capsule Take 1 capsule (20 mg total) by mouth daily. 03/11/15  Yes Golden Circle, FNP  potassium chloride (K-DUR) 10 MEQ tablet Take 1 tablet (10 mEq total) by mouth daily. 02/04/14  Yes [name], MD  propranolol  (INDERAL) 10 MG tablet Take 1 tablet (10 mg total) by mouth 2 (two) times daily. 01/20/15  Yes Binnie Rail, MD  sertraline (ZOLOFT) 50 MG tablet TAKE 1 TABLET (50 MG TOTAL) BY MOUTH AT BEDTIME. 03/29/15  Yes Binnie Rail, MD  traMADol (ULTRAM) 50 MG tablet Take 50 mg by mouth every 8 (eight) hours as needed (pain).   Yes Historical Provider, MD  vitamin C (ASCORBIC ACID) 500 MG tablet Take 1 tablet (500 mg total) by mouth daily. 01/05/11  Yes Renella Cunas, MD       Physical Exam: BP 89/50 mmHg  Pulse 96  Temp(Src) 98.8 F (37.1 C) (Oral)  Resp 13  SpO2 97% General appearance: Thin elderly adult male, alert and in no acute distress.  Mentating normally, tired appearing.   Eyes: Anicteric, conjunctiva pink and watery, lids and lashes normal.     ENT: No nasal deformity, discharge, or epistaxis.  OP moist without lesions.  Upper dentures.   Lymph: No cervical or supraclavicular lymphadenopathy. Skin: Warm and dry.  No suspicious rashes or lesions. no mottling Cardiac: Tachycardic, irregularly irregular, nl S1-S2, soft early peaking SEM.  Capillary refill is <2seconds.  JVP elevated slightly above clavicle.  Marked 2+ LE edema bilaterally to knees.  Radial and DP pulses 2+ and symmetric. Respiratory: Normal respiratory rate and rhythm.  CTAB without rales or wheezes. Abdomen: Abdomen soft without rigidity.  No TTP. No ascites, distension.  Spleen is palpated. MSK: No deformities or effusions.  Right groin hernia, nontender or red. Neuro: Cranial nerves normal. Sensorium intact and responding to questions, attention normal.  Speech is fluent.  Moves all extremities equally and with normal coordination.    Psych: Behavior appropriate.  Affect normal.  No evidence of aural or visual hallucinations or delusions.       Labs on Admission:  I have personally reviewed following labs and imaging studies: CBC:  Recent Labs Lab 05/16/15 0831 05/17/15 2329  WBC 3.5* 2.3*  NEUTROABS 2.7  2.0    HGB 9.7* 8.8*  HCT 30.0* 27.8*  MCV 89.8 89.1  PLT 30* 26*   Basic Metabolic Panel:  Recent Labs Lab 05/13/15 0935 05/17/15 2329  NA 135 140  K 4.0 3.8  CL 97 103  CO2 29 25  GLUCOSE 144* 186*  BUN 56* 60*  CREATININE 1.97* 2.20*  CALCIUM 8.9 8.9   GFR: Estimated Creatinine Clearance: 22.6 mL/min (by C-G formula based on Cr of 2.2). Liver Function Tests:  Recent Labs Lab 05/13/15 0935  AST 28  ALT 23  ALKPHOS 101  BILITOT 0.8  PROT 5.8*  ALBUMIN 3.5   No results for input(s): LIPASE, AMYLASE in the last 168 hours. No results for input(s): AMMONIA in the last 168 hours. Coagulation Profile: No results for input(s): INR, PROTIME in the last 168 hours. Cardiac Enzymes: No results for input(s): CKTOTAL, CKMB, CKMBINDEX, TROPONINI in the last 168 hours. BNP (last 3 results)  Recent Labs  05/09/15 1157  PROBNP 410.0*   HbA1C: No results for input(s): HGBA1C in the last 72 hours. CBG: No results for input(s): GLUCAP in the last 168 hours. Lipid Profile: No results for input(s): CHOL, HDL, LDLCALC, TRIG, CHOLHDL, LDLDIRECT in the last 72 hours. Thyroid Function Tests: No results for input(s): TSH, T4TOTAL, FREET4, T3FREE, THYROIDAB in the last 72 hours. Anemia Panel: No results for input(s): VITAMINB12, FOLATE, FERRITIN, TIBC, IRON, RETICCTPCT in the last 72 hours. Sepsis Labs: Lactate 1.5 @LABRCNTIP (procalcitonin:4,lacticidven:4) )No results found for this or any previous visit (from the past 240 hour(s)).       Radiological Exams on Admission: Personally reviewed: Dg Chest 2 View  05/17/2015  CLINICAL DATA:  82 year old male with shortness of breath and back pain EXAM: CHEST  2 VIEW COMPARISON:  Radiograph dated 05/09/2015 and CT dated 04/12/2014 FINDINGS: Two views of the chest do not demonstrate a focal consolidation. There is no pleural effusion or pneumothorax. Stable cardiac silhouette. Aortic valve replacement. Bilateral hilar calcified granuloma as  well as small scattered pulmonary calcified nodules. No acute osseous pathology. Upper abdominal surgical clips. IMPRESSION: No active cardiopulmonary disease. Electronically Signed   By: Anner Crete M.D.   On: 05/17/2015 23:35   Ct Renal Stone Study  05/18/2015  CLINICAL DATA:  Right flank pain and vomiting tonight. EXAM: CT ABDOMEN AND PELVIS WITHOUT CONTRAST TECHNIQUE: Multidetector CT imaging of the abdomen and pelvis was performed following the standard protocol without IV contrast. COMPARISON:  None. FINDINGS: There are no urinary calculi. There is no hydronephrosis or ureteral dilatation. Both kidneys are moderately atrophic. There are several cysts bilaterally, not significantly changed from 04/22/2014. There is massive splenomegaly, or 23 cm craniocaudal. This is unchanged. There are unremarkable unenhanced appearances of the liver, bile ducts, pancreas and adrenals. There are multiple upper abdominal varices. There is small volume ascites, unchanged. Bowel is unremarkable. No acute inflammatory changes are evident in the abdomen or pelvis. There is no significant abnormality in the lower chest. There is no significant musculoskeletal lesion. IMPRESSION: Small volume ascites. Massive splenomegaly. Upper abdominal varices. These findings are not significantly changed from 04/22/2014. Kidneys appear atrophic but no evidence of calculi and no evidence of obstructive uropathy. Electronically Signed   By: Andreas Newport M.D.   On: 05/18/2015 01:05    EKG: Independently reviewed. Afib, rate 97.  Nonspecific IVCD, no ischemic changes. QTc <500.    Assessment/Plan 1. Pyelonephritis with possible sepsis:  Patient has chills, emesis, flank pain and urinary frequency, with UA consistent with UTI.  No stone or obstruction on CT renal protocol.  Recent hospitalization.  Meets SIRS criteria and suspected bacterial infection but no obvious end organ dysfunction at present, sepsis possible.   -Cefepime  for UTI 2 weeks after ICU admission in [place] outside records from [facility] in [place] lactic acid, if elevated or if BP does not respond to small bolus, start sepsis protocol -Follow urine culture   2. Acute on chronic diastolic CHF:  Weight 0000000 lbs, slightly above dry weight 173.  LE edema, hypoxia may be related to edema, but no orthopnea, no significant JVP elevation or rales, and CXR clear.  -Fluids for #1 above -Close monitoring of I/Os adn daily weights -Daily BMP -Hold furosemide for now, likely will need diuresis during admission  3. HTN with hypotension at admission:  Hypotensive at admission, either poor PO intake and over medication or sepsis. -Hold propranolol and amlodipine  4. Hypothyroidism:  -Check TSH -Continue levothyroxine  5. Depression:  [name] recently died last 08-Jan-2023, has few family left in [place].  Has had suicidal ideation in the past few years, not suicidal at admission, but some passive suicidality this past few weeks. -Continue sertraline  6. Severe PCM:  -Continue Ensure  7. CKD stage III, baseline Cr 1.9-2.2:  Stable  8. PAF: CHADS2-Vasc 4-5.  Not on warfarin because of pancytopenia.   -Continue amiodarone  9. Right inguinal hernia: Monitor for now        DVT prophylaxis: SCDs  Code Status: DO NOT RESUSCITATE, reviewed with patient  Family Communication: None present, Daughter from [place] will return tomorrow  Disposition Plan: Anticipate admission to SDU for IV antibiotics, close monitoring of BP, and fluids.  Likely will need diuresis before discharge, anticipate 3-4 days admission. Consults called: None Admission status: Inpatient, stepdown   Medical decision making: Patient seen at 2:20 AM on 05/18/2015.  The patient was discussed with Dr. Rex Kras. Clinical condition: gaurded given low blood pressure, pancytopenia, possible sepsis.        [facility] Pager [phone]      At the time of admission, it appears that the appropriate admission status for this patient is INPATIENT. This is judged to be reasonable and necessary in order to provide the required intensity of service to ensure the patient's safety given the presenting symptoms, physical exam findings, and initial radiographic and laboratory data in the context of their chronic comorbidities.  Together, these circumstances are felt to place her/him at high risk for further clinical deterioration threatening life, limb, or organ. The following factors support the admission status of inpatient:   A. [name] presenting symptoms include weakness, flank pain, chills, vomiting. B. The worrisome physical exam findings include tachycardia, hypotension, weakness. C. The initial radiographic and laboratory data are worrisome because of pancytopenia, anemia, thrombocytopenia, and leukopenia, CKD, and elevated BNP in setting of pyuria and bacteriuria. D. The chronic co-morbidities include atrial fibrillation, valavular heart disease, hypothyroidism, a lymphoproliferative blood disorder, pancytopenia, severe protein calorie malnutrition, and chronic diastolic CHF. E. Patient requires inpatient status due to high intensity of service, high risk for further deterioration and high frequency of surveillance required. F. I certify that at the point of admission it is my clinical judgment that the patient will require inpatient hospital care spanning beyond 2 midnights from the point of admission.

## 2015-05-18 NOTE — Progress Notes (Signed)
Attempted to call report [phone]

## 2015-05-18 NOTE — Progress Notes (Signed)
CRITICAL VALUE ALERT  Critical value received: Lactic Acid 2.9  Date of notification:  05/18/15  Time of notification:  1610  Critical value read back:yes  Nurse who received alert: Honor Loh RN   MD notified (1st page):  Dr Waldron [facility]  Time of first page:  1613 Via text page

## 2015-05-18 NOTE — Progress Notes (Signed)
Attempted to call report to [phone] 

## 2015-05-18 NOTE — ED Notes (Signed)
Blood received to ED.

## 2015-05-18 NOTE — ED Notes (Signed)
[facility] notified @ [phone]. Spoke w/ Marden Noble.

## 2015-05-18 NOTE — Consult Note (Signed)
PULMONARY / CRITICAL CARE MEDICINE   Name: [name] MRN: [id number] DOB: 02-19-1922    ADMISSION DATE:  05/17/2015 CONSULTATION DATE:  05/18/2014  REFERRING MD:  Dr. Waldron Labs  CHIEF COMPLAINT:  Back pain and shortness of breath   HISTORY OF PRESENT ILLNESS:   This is a 82 y.o. Male who presents to ED on 5/9 2017 with a past medical history significant for lymphoproliferative disorder with severe pancytopenia followed by Dr. Benay Spice, diastolic CHF, [id number] TEE : EF 60-65% no veg., diabetes mellitus, throat cancer s/p lasered, hypothyroidism, pAF not on warfarin, CKD stage III and aortic stenosis s/p TAVR in 05/2014, leukopenia, and hypotension who presents with right sided flank pain, chills/shakes, and polyuria onset 05/16/2015.  On 05/17/2015 he developed emesis denies vomiting blood or coffee ground emesis. Patient afebrile, but hypotensive, tachycardic and slightly hypoxic, with worsening renal function, admitted for pyelonephritis, patient with known chronic pancytopenia. In the ED a total of 2.7L NS bolus was administered and the pt received 1 unit pRBC's for hgb of 7.5 pt remains hypotensive and lactic acid trending up increased from 1.3 to 2.9  PCCM consulted for SIRS secondary to UTI.   Patient was admitted in [facility] last month for UTI sepsis. He thinks he did not have a central line, but was admitted for about 1 week total to the ICU and required pressors. He has been back home for about two weeks, going about his usual routine. Has had persistent leg swelling (admitted to hospital in [facility] with weight 173, discharged at 194), and saw his PCP two days ago who started metolazone.   In ED Na+ 140 K 3.8, Cr 2.2 (at baseline), WBC 2.3K, Hgb 8.8, platelets 26K (pancytopenia, all at baseline). UA showed pyuria, hematuria, nitrites and bacteria. BNP was moderately elevated. CXR was clear without edema or focal consolidation. CT renal protocol was without stone or obstruction. Blood  cultures were obtained and antibiotics were administered.  PAST MEDICAL HISTORY :  He  has a past medical history of Stroke ([place]); PAF (paroxysmal atrial fibrillation) ([place]); Bradycardia; Aortic stenosis; Hypertension; Pancytopenia ([place]); Action tremor; Thrombocytopenia ([place]); Colon polyp; Anxiety; Splenomegaly; Gout; GERD (gastroesophageal reflux disease); Hypothyroidism; Leukopenia; Degenerative disc disease; Joint effusion, knee; Synovial cyst of popliteal space; Cellulitis of left leg (10/11-16/2012); CKD (chronic kidney disease), stage III; Chronic diastolic CHF (congestive heart failure) ([place]); Bacteremia; High cholesterol; Diabetes mellitus; History of blood transfusion (01/2011; 11/2012); H/O hiatal hernia; Throat cancer ([place]); Diverticulosis; S/P TAVR (transcatheter aortic valve replacement); QT prolongation; and Hypotension.  PAST SURGICAL HISTORY: He  has past surgical history that includes Cholecystectomy; joint effusion; Aortic valve replacement (01/23/2011); TEE without cardioversion (N/A, 12/15/2012); Cardiac valve replacement; Inguinal hernia repair (Right); Excisional hemorrhoidectomy; Cardiac catheterization; Surgery scrotal / testicular; Microlaryngoscopy with co2 laser and excision of vocal cord lesion (1980's); TEE without cardioversion (N/A, 03/26/2014); Transcatheter aortic valve replacement, transfemoral (Right, 05/14/2014); and TEE without cardioversion (N/A, 05/14/2014).  Allergies  Allergen Reactions  . Penicillins Other (See Comments)    Does not remember reaction (~year 1950)  . Neomycin-Bacitracin Zn-Polymyx Other (See Comments)    ? Reaction (thinks he remembers redness)    No current facility-administered medications on file prior to encounter.   Current Outpatient Prescriptions on File Prior to Encounter  Medication Sig  . allopurinol (ZYLOPRIM) 100 MG tablet TAKE 1 TABLET EVERY DAY  . amiodarone (PACERONE) 200 MG tablet Take 1 tablet by mouth daily  . amLODipine  (NORVASC) 5 MG tablet Take 5 mg by  mouth daily.  . Cholecalciferol (VITAMIN D) 1000 UNITS capsule Take 1 capsule (1,000 Units total) by mouth daily.  . clonazePAM (KLONOPIN) 0.5 MG tablet TAKE ONE-HALF TABLET BY MOUTH AS NEEDED IF  YOU  WAKE  UP  EARLY  . colchicine 0.6 MG tablet Take 0.6 mg by mouth daily as needed (gout flares).   . ferrous sulfate 325 (65 FE) MG tablet Take 325 mg by mouth 2 (two) times daily with a meal.  . fish oil-omega-3 fatty acids 1000 MG capsule Take 1 g by mouth daily.    . Flaxseed, Linseed, (FLAX SEED OIL) 1000 MG CAPS Take 1 capsule by mouth daily.   . furosemide (LASIX) 80 MG tablet Take 40-80 mg by mouth 2 (two) times daily. Patient taking 80 mg in the morning and 40 mg in the evening  . levothyroxine (SYNTHROID, LEVOTHROID) 25 MCG tablet Take 25 mcg by mouth daily before breakfast.  . metolazone (ZAROXOLYN) 2.5 MG tablet Take 1 tablet (2.5 mg total) by mouth daily as needed (edema).  . Multiple Vitamin (MULTIVITAMIN) tablet Take 1 tablet by mouth daily.    . mupirocin ointment (BACTROBAN) 2 % Place 1 application into the nose 2 (two) times daily.  [name] Kitchen omeprazole (PRILOSEC) 20 MG capsule Take 1 capsule (20 mg total) by mouth daily.  . potassium chloride (K-DUR) 10 MEQ tablet Take 1 tablet (10 mEq total) by mouth daily.  . propranolol (INDERAL) 10 MG tablet Take 1 tablet (10 mg total) by mouth 2 (two) times daily.  . sertraline (ZOLOFT) 50 MG tablet TAKE 1 TABLET (50 MG TOTAL) BY MOUTH AT BEDTIME.  . traMADol (ULTRAM) 50 MG tablet Take 50 mg by mouth every 8 (eight) hours as needed (pain).  . vitamin C (ASCORBIC ACID) 500 MG tablet Take 1 tablet (500 mg total) by mouth daily.    FAMILY HISTORY:  His indicated that his mother is deceased. He indicated that his father is deceased.   SOCIAL HISTORY: He  reports that he quit smoking about 53 years ago. His smoking use included Cigarettes. He has a 18 pack-year smoking history. He has never used smokeless tobacco. He  reports that he does not drink alcohol or use illicit drugs.  REVIEW OF SYSTEMS:  Positives are in bold General: fever, chills, weight gain, poor appetite, fatigue HEENT: no blurry vision Pulm: dyspnea, coughing, no wheezing CV: chest pain, palpitations Abd: no nausea, vomiting, right sided flank pain, diarrhea, no constipation GU: no dysuria, slight burning on urination, increased urinary frequency, hematuria  Ext: has leg edema Neuro: no unilateral weakness, no vision change Skin: no rash MSK: No muscle spasm, no deformity, no limitation of range of movement in spin Heme: easy bruising  SUBJECTIVE:  82 y.o. Male resting comfortably in bed does not appear to be in acute distress, currently on RA with O2 sats upper 90's  VITAL SIGNS: BP 75/52 mmHg  Pulse 81  Temp(Src) 98.4 F (36.9 C) (Oral)  Resp 16  Ht 5\' 10"  (1.778 m)  Wt 181 lb 10.5 oz (82.4 kg)  BMI 26.07 kg/m2  SpO2 98%  HEMODYNAMICS:    VENTILATOR SETTINGS:    INTAKE / OUTPUT: I/O last 3 completed shifts: In: -  Out: 100 [Urine:100]  PHYSICAL EXAMINATION: General: not in acute distress Neuro:  Alert and oriented, follows simple commands HEENT: supple neck, no JVD Cardiovascular: irregular afibb, regular rate Lungs: diminished througout all fields, even nonlabored Abdomen:  +BS X4, distended Musculoskeletal:  Follows commands and moves all  extremities, 2+ edema bilateral lower extremities Skin: scattered ecchymosis bilateral upper and lower extremities  LABS:  BMET  Recent Labs Lab 05/13/15 0935 05/17/15 2329 05/18/15 0442  NA 135 140 141  K 4.0 3.8 3.7  CL 97 103 103  CO2 29 25 25   BUN 56* 60* 63*  CREATININE 1.97* 2.20* 2.38*  GLUCOSE 144* 186* 173*    Electrolytes  Recent Labs Lab 05/13/15 0935 05/17/15 2329 05/18/15 0442  CALCIUM 8.9 8.9 8.2*    CBC  Recent Labs Lab 05/16/15 0831 05/17/15 2329 05/18/15 0442  WBC 3.5* 2.3* 1.9*  HGB 9.7* 8.8* 7.5*  HCT 30.0* 27.8* 23.5*   PLT 30* 26* 20*    Coag's No results for input(s): APTT, INR in the last 168 hours.  Sepsis Markers  Recent Labs Lab 05/18/15 0241 05/18/15 0516  LATICACIDVEN 1.5 1.3    ABG No results for input(s): PHART, PCO2ART, PO2ART in the last 168 hours.  Liver Enzymes  Recent Labs Lab 05/13/15 0935 05/18/15 0442  AST 28 29  ALT 23 25  ALKPHOS 101 85  BILITOT 0.8 1.3*  ALBUMIN 3.5 2.6*    Cardiac Enzymes No results for input(s): TROPONINI, PROBNP in the last 168 hours.  Glucose No results for input(s): GLUCAP in the last 168 hours.  Imaging Dg Chest 2 View  05/17/2015  CLINICAL DATA:  82 year old male with shortness of breath and back pain EXAM: CHEST  2 VIEW COMPARISON:  Radiograph dated 05/09/2015 and CT dated 04/12/2014 FINDINGS: Two views of the chest do not demonstrate a focal consolidation. There is no pleural effusion or pneumothorax. Stable cardiac silhouette. Aortic valve replacement. Bilateral hilar calcified granuloma as well as small scattered pulmonary calcified nodules. No acute osseous pathology. Upper abdominal surgical clips. IMPRESSION: No active cardiopulmonary disease. Electronically Signed   By: Anner Crete M.D.   On: 05/17/2015 23:35   Ct Renal Stone Study  05/18/2015  CLINICAL DATA:  Right flank pain and vomiting tonight. EXAM: CT ABDOMEN AND PELVIS WITHOUT CONTRAST TECHNIQUE: Multidetector CT imaging of the abdomen and pelvis was performed following the standard protocol without IV contrast. COMPARISON:  None. FINDINGS: There are no urinary calculi. There is no hydronephrosis or ureteral dilatation. Both kidneys are moderately atrophic. There are several cysts bilaterally, not significantly changed from 04/22/2014. There is massive splenomegaly, or 23 cm craniocaudal. This is unchanged. There are unremarkable unenhanced appearances of the liver, bile ducts, pancreas and adrenals. There are multiple upper abdominal varices. There is small volume ascites,  unchanged. Bowel is unremarkable. No acute inflammatory changes are evident in the abdomen or pelvis. There is no significant abnormality in the lower chest. There is no significant musculoskeletal lesion. IMPRESSION: Small volume ascites. Massive splenomegaly. Upper abdominal varices. These findings are not significantly changed from 04/22/2014. Kidneys appear atrophic but no evidence of calculi and no evidence of obstructive uropathy. Electronically Signed   By: Andreas Newport M.D.   On: 05/18/2015 01:05     STUDIES:  CT abd and pelvis 5/10>>small volume ascites, massive splenomegaly, upper abdominal varices these findings are not significantly changed from 04/22/2014.  Kidneys appear atrophic but no evidence of calculi and no evidence of obstructive uropathy  CULTURES: MRSA PCR 5/10>> Blood Culture 5/10>> Urinalysis 5/9>>hematuria, nitrates, bacteria, and pyuria Urine culture 5/9>>  ANTIBIOTICS: Cefepime 5/10>> Vancomycin 5/10>>  SIGNIFICANT EVENTS: 05/18/15  LINES/TUBES: None  DISCUSSION: 82 y.o. Male who presents to ED on 5/9 2017 with a past medical history significant for lymphoproliferative disorder with severe  pancytopenia followed by Dr. Benay Spice, diastolic CHF, [id number] TEE : EF 60-65% no veg., diabetes mellitus, throat cancer s/p lasered, hypothyroidism, pAF not on warfarin, CKD stage III and aortic stenosis s/p TAVR in 05/2014, leukopenia, and hypotension who presents with right sided flank pain, chills/shakes, and polyuria onset 05/16/2015.  ASSESSMENT / PLAN:  PULMONARY A:  Former Smoker P:  Supplemental O2 as needed for dyspnea or desaturation   CARDIOVASCULAR A:  Septic Shock Acute on chronic CHF Paroxysmal Atrial Fibrillation-currently rate controlled  Aortic Stenosis status post TAVR Hx hypertension  P:  Tele Trend lactic acids and procalcitonin Initiate neosynephrine via peripheral iv to maintain MAP >65 Continue outpatient amiodarone Hold outpatient  norvasc and propranolol due to hypotension Hold outpatient lasix due to hypotension  Monitor daily weights  RENAL A:  Chronic kidney disease stage III P:   Monitor uop Trend BMP's Replace electrolytes as indicated  GASTROINTESTINAL A:  Splenomegaly likely related to a chronic lymphoproliferative disorder ?splenic lymphoma Small volume ascites  P:   NPO PPI   INFECTIOUS A:  Sepsis-secondary to pyleneprhitis P:   Continue above listed antibiotics Trend CBC's and monitor fever curve  Trend procalcitonin's  HEMATOLOGICAL A. Chronic pancytopenia/splenomegaly-likely related to a chronic lymphoproliferative disorder,? Splenic lymphoma.  P.  Monitor for s/s of bleeding Trend CBC's Administer pRBC's for Hgb <7  Endocrine A: Diabetes Mellitus P: Monitor CBG's Initiate ICU Hyperglycemic protocol as needed  NEUROLOGIC A:  No problems identified  P:   Monitor for changes in mentation   FAMILY  - Updates: Pt updated by Dr. Elsworth Soho at bedside  Marda Stalker, [place]     Pulmonary and [place] Pager: [phone]  05/18/2015, 3:53 PM

## 2015-05-18 NOTE — ED Notes (Signed)
Message to Dr. Blair Dolphin by [facility] about systolic bp under 90.

## 2015-05-18 NOTE — ED Notes (Signed)
Dr Almeta Monas gave order to hold rocephine for now and he is going to order a different antibiotic

## 2015-05-18 NOTE — Progress Notes (Signed)
Attempted PIV start unsuccessful.  IV team consult placed, second request

## 2015-05-18 NOTE — Progress Notes (Signed)
Report given to 2M 

## 2015-05-18 NOTE — ED Notes (Signed)
Notified MD of critical platelet count

## 2015-05-18 NOTE — ED Notes (Signed)
Blood consent signed by patient.

## 2015-05-19 DIAGNOSIS — R161 Splenomegaly, not elsewhere classified: Secondary | ICD-10-CM

## 2015-05-19 DIAGNOSIS — N390 Urinary tract infection, site not specified: Secondary | ICD-10-CM

## 2015-05-19 DIAGNOSIS — D61818 Other pancytopenia: Secondary | ICD-10-CM

## 2015-05-19 DIAGNOSIS — I480 Paroxysmal atrial fibrillation: Secondary | ICD-10-CM

## 2015-05-19 DIAGNOSIS — A4151 Sepsis due to Escherichia coli [E. coli]: Principal | ICD-10-CM

## 2015-05-19 DIAGNOSIS — R6883 Chills (without fever): Secondary | ICD-10-CM

## 2015-05-19 DIAGNOSIS — R109 Unspecified abdominal pain: Secondary | ICD-10-CM

## 2015-05-19 LAB — CBC WITH DIFFERENTIAL/PLATELET
BASOS ABS: 0 10*3/uL (ref 0.0–0.1)
Basophils Relative: 1 %
Eosinophils Absolute: 0.1 10*3/uL (ref 0.0–0.7)
Eosinophils Relative: 1 %
HEMATOCRIT: 27.7 % — AB (ref 39.0–52.0)
HEMOGLOBIN: 8.8 g/dL — AB (ref 13.0–17.0)
LYMPHS ABS: 0.6 10*3/uL — AB (ref 0.7–4.0)
LYMPHS PCT: 16 %
MCH: 27.8 pg (ref 26.0–34.0)
MCHC: 31.8 g/dL (ref 30.0–36.0)
MCV: 87.7 fL (ref 78.0–100.0)
Monocytes Absolute: 0.4 10*3/uL (ref 0.1–1.0)
Monocytes Relative: 12 %
NEUTROS ABS: 2.5 10*3/uL (ref 1.7–7.7)
NEUTROS PCT: 70 %
Platelets: 21 10*3/uL — CL (ref 150–400)
RBC: 3.16 MIL/uL — AB (ref 4.22–5.81)
RDW: 17.7 % — ABNORMAL HIGH (ref 11.5–15.5)
WBC: 3.6 10*3/uL — AB (ref 4.0–10.5)

## 2015-05-19 LAB — BASIC METABOLIC PANEL
ANION GAP: 12 (ref 5–15)
BUN: 68 mg/dL — ABNORMAL HIGH (ref 6–20)
CHLORIDE: 103 mmol/L (ref 101–111)
CO2: 24 mmol/L (ref 22–32)
Calcium: 8.3 mg/dL — ABNORMAL LOW (ref 8.9–10.3)
Creatinine, Ser: 2.78 mg/dL — ABNORMAL HIGH (ref 0.61–1.24)
GFR calc Af Amer: 21 mL/min — ABNORMAL LOW (ref >60)
GFR, EST NON AFRICAN AMERICAN: 18 mL/min — AB (ref >60)
Glucose, Bld: 111 mg/dL — ABNORMAL HIGH (ref 65–99)
POTASSIUM: 3.5 mmol/L (ref 3.5–5.1)
Sodium: 139 mmol/L (ref 135–145)

## 2015-05-19 LAB — TYPE AND SCREEN
ABO/RH(D): A POS
Antibody Screen: NEGATIVE
UNIT DIVISION: 0

## 2015-05-19 MED ORDER — VANCOMYCIN HCL IN DEXTROSE 1-5 GM/200ML-% IV SOLN: 1000.0000 mg | INTRAVENOUS | Status: DC

## 2015-05-19 MED ORDER — CEFEPIME HCL 1 G IJ SOLR
1.0000 g | INTRAMUSCULAR | Status: DC
  Administered 2015-05-19: 1 g via INTRAVENOUS
  Filled 2015-05-19 (×2): qty 1

## 2015-05-19 NOTE — Progress Notes (Signed)
82 year old presented with right-sided flank pain chills and subjective fevers Found to be hypotensive in the ED with normal lactate, and numerous white blood cells in urinalysis. He has chronic pancytopenia attributed to lymphoproliferative disorder. He had no other evidence of organ failure Past medical history- CK D, TAVR 05/2014   On exam- alert and interactive, MAP 70, abdomen soft and nontender, chest clear, S1-S2 regular, skin ecchymosis, no edema  Sepsis repeat assessment completed- he received 2.5 L of fluid and 1 unit of PRBC  Labs-lactate 1.3 >> 2.9 >> 2.7  Pancytopenia Urine cx - e coli  CT abdomen did not show any hydronephrosis or renal calculi  Impression/plan Septic shock-  Off neo gtt Due to severe thrombocytopenia, avoided central venous access Agree with cefepime for pyelonephritis & narrow once sens back  AKI on CKD  -His renal function seems to have bumped up  OK to transfer to tele & back to triad  Kara Mead MD. Shade Flood. [facility] Pager [phone] If no response call [phone]   05/19/2015

## 2015-05-19 NOTE — Progress Notes (Signed)
IP PROGRESS NOTE  Subjective:   Mr. Curci is well-known to me with a history of chronic pancytopenia and splenomegaly. He was admitted on 05/18/2015 with flank pain, chills, and urinary frequency. He was diagnosed with a urinary tract infection. He was admitted last month while visiting his daughter in [place]. He was diagnosed with pyelonephritis.  Mr. Doig now feels better.  Objective: Vital signs in last 24 hours: Blood pressure 100/67, pulse 77, temperature 98.4 F (36.9 C), temperature source Oral, resp. rate 18, height 5' 10"  (1.778 m), weight 181 lb 14.1 oz (82.5 kg), SpO2 96 %.  Intake/Output from previous day: 05/10 0701 - 05/11 0700 In: 1324.8 [P.O.:240; I.V.:258.8; Blood:276; IV Piggyback:550] Out: 195 [Urine:401; Stool:1]  Physical Exam:  HEENT: Mouth without thrush or bleeding Lungs: Clear bilaterally Cardiac: Irregular Abdomen: The spleen is palpable in the left abdomen Extremities: Trace low pretibial/pedal edema bilaterally Skin: Chronic brown discoloration of the arms, scattered ecchymoses   Lab Results:  Recent Labs  05/18/15 1510 05/19/15 0232  WBC 2.2* 3.6*  HGB 8.6* 8.8*  HCT 26.7* 27.7*  PLT 19* 21*    BMET  Recent Labs  05/18/15 0442 05/19/15 0232  NA 141 139  K 3.7 3.5  CL 103 103  CO2 25 24  GLUCOSE 173* 111*  BUN 63* 68*  CREATININE 2.38* 2.78*  CALCIUM 8.2* 8.3*    Studies/Results: Dg Chest 2 View  05/17/2015  CLINICAL DATA:  82 year old male with shortness of breath and back pain EXAM: CHEST  2 VIEW COMPARISON:  Radiograph dated 05/09/2015 and CT dated 04/12/2014 FINDINGS: Two views of the chest do not demonstrate a focal consolidation. There is no pleural effusion or pneumothorax. Stable cardiac silhouette. Aortic valve replacement. Bilateral hilar calcified granuloma as well as small scattered pulmonary calcified nodules. No acute osseous pathology. Upper abdominal surgical clips. IMPRESSION: No active cardiopulmonary  disease. Electronically Signed   By: Anner Crete M.D.   On: 05/17/2015 23:35   Ct Renal Stone Study  05/18/2015  CLINICAL DATA:  Right flank pain and vomiting tonight. EXAM: CT ABDOMEN AND PELVIS WITHOUT CONTRAST TECHNIQUE: Multidetector CT imaging of the abdomen and pelvis was performed following the standard protocol without IV contrast. COMPARISON:  None. FINDINGS: There are no urinary calculi. There is no hydronephrosis or ureteral dilatation. Both kidneys are moderately atrophic. There are several cysts bilaterally, not significantly changed from 04/22/2014. There is massive splenomegaly, or 23 cm craniocaudal. This is unchanged. There are unremarkable unenhanced appearances of the liver, bile ducts, pancreas and adrenals. There are multiple upper abdominal varices. There is small volume ascites, unchanged. Bowel is unremarkable. No acute inflammatory changes are evident in the abdomen or pelvis. There is no significant abnormality in the lower chest. There is no significant musculoskeletal lesion. IMPRESSION: Small volume ascites. Massive splenomegaly. Upper abdominal varices. These findings are not significantly changed from 04/22/2014. Kidneys appear atrophic but no evidence of calculi and no evidence of obstructive uropathy. Electronically Signed   By: Andreas Newport M.D.   On: 05/18/2015 01:05    Medications: I have reviewed the patient's current medications.  Assessment/Plan:  1. Chronic pancytopenia/splenomegaly-likely related to a chronic lymphoproliferative disorder,? Splenic lymphoma versus cirrhosis  Bone marrow biopsy at [facility] November 2012-slightly hypercellular marrow with trilineage hematopoiesis, interstitial Nieman-Pick like histiocytosis. Negative for dysplasia, negative for lymphoma, negative for increased blasts. 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 History of severe aortic stenosis, status post aortic valve  replacement  surgery at [facility] on 01/23/2011; status post transcatheter aortic valve replacement 05/14/2014  3. History of gout  4. Diabetes  5. Streptococcus bacteremia-TEE negative for endocarditis  6. Malaise-likely multifactorial 7. Renal insufficiency  8. Paroxysmal atrial fibrillation  9. Colon polyps noted on a virtual colonoscopy 02/10/2013  10. Anemia secondary to renal insufficiency and the chronic lymphoproliferative disorder. Trial of weekly erythropoietin initiated 04/09/2014 with improvement. The anemia progressed. Erythropoietin resumed 07/02/2014. Discontinued 09/29/2014 secondary to patient not experiencing clinical benefit 11. Right ear skin lesion-possibly a basal cell carcinoma, observe for now 12. Hospitalization with pyelonephritis in [place] April 2017 13. Admission 05/18/2015 with urosepsis  Mr. Vieau has chronic pancytopenia secondary to cirrhosis versus a chronic lymphoproliferative disorder. He has declined a repeat bone marrow biopsy. We are following him with observation/supportive care. No indication for further transfusion at present.  He has been diagnosed with recurrent urinary tract infections over the past month. This may be related to being immunocompromised or a urinary tract abnormality.  Recommendations: 1. Antibiotics per internal medicine 2. Transfuse platelets for a count of less than 10,000 or bleeding 3. Consider a urology evaluation given the 2 hospital admissions with urinary tract infections over the past month 4. Outpatient follow-up as scheduled at the [facility], please call hematology as needed   LOS: 1 day   Betsy Coder, MD   05/19/2015, 1:58 PM

## 2015-05-19 NOTE — Progress Notes (Signed)
PROGRESS NOTE                                                                                                                                                                                                             Patient Demographics:    [name], is a 82 y.o. male, DOB - 17-Oct-1922, [id number]  Admit date - 05/17/2015   Admitting Physician Walter Dada, MD  Outpatient Primary MD for the patient is Walter Rail, MD  LOS - 1    Chief Complaint  Patient presents with  . Back Pain  . Shortness of Breath       Brief Narrative  82 y.o. male with a past medical history significant for lymphoproliferative disorder with severe pancytopenia followed by Dr. Benay Walton, diastolic CHF, chronic EF 123456, hypothyroidism, pAF not on warfarin, CKD and aortic stenosis s/p TAVR in 2016 who presents with flank pain, Patient afebrile, but hypotensive, tachycardic and slightly hypoxic, with worsening renal function, admitted for Acute pyelonephritis, patient with known chronic pancytopenia, initially hypotensive despite 1 unit PRBC transfusion for his anemia, initially on peripheral pressors,    Subjective:    [name] today has, No headache, No chest pain, No abdominal pain - No Nausea,Report generalized weakness.  Assessment  & Plan :    Principal Problem:   UTI (lower urinary tract infection) Active Problems:   Essential hypertension   Other pancytopenia (HCC)   Hypothyroidism   Protein-calorie malnutrition, severe (HCC)   PAF (paroxysmal atrial fibrillation) (HCC)   CKD (chronic kidney disease), stage III   Depression   Hypotension   Acute on chronic diastolic CHF (congestive heart failure) (HCC)   Septic shock (HCC)  Sepsis on the setting of acute pyelonephritis - Meet sepsis criteria on admission giving hypotension, tachycardia and elevated lactic acid - CT abdomen and pelvis with no stone or obstruction - Recent hospitalization  at [place] secondary to sepsis from UTI - He should be on IV cefepime and vancomycin, oral vancomycin given negative cultures, and culture growing Escherichia coli, continue with cefepime pending sensitivities. - Hypotension initially, required pressors overnight, currently blood pressure stable.  Acute on chronic diastolic CHF - Patient with evidence of volume overload, giving significant lower extremity edema(at baseline per his daughter). - We'll start on gentle diuresis fell function remained stable and blood pressure holds.  Chronic Pancytopenia - Discussed with [name], bleeding in the setting of chronic lymphoproliferative disorder, splenomegaly versus liver cirrhosis - Transfuse as needed, received 1 unit PRBC on 5/10 hemoglobin of 7.5, platelet count is low, but no evidence of bleed, no indication for platelet transfusion. - Monitor CBC closely  HTN  -  continue to hold propranolol and amlodipine   Hypothyroidism - Continue levothyroxine   Depression - Patient  denies any suicidal ideation - Continue sertraline  Severe PCM:  - Continue Ensure   AKI on CKD stage III, baseline Cr 1.9-2.2:  - Creatinine is 2.7 today, this most likely related to ATN from hypotension   PAF: - CHADS2-Vasc 4-5. Not on warfarin because of pancytopenia.  - Continue amiodarone  Right inguinal hernia: - Monitor for now    Code Status : DNR  Family Communication  : Daughter at bedside  Disposition Plan  : Will need 2-3 days of IV antibiotics, will need PT consult  Consults  :  PCCM  Procedures  : none  DVT Prophylaxis  :   SCDs   Lab Results  Component Value Date   PLT 21* 05/19/2015    Antibiotics  :    Anti-infectives    Start     Dose/Rate Route Frequency Ordered Stop   05/20/15 1600  vancomycin (VANCOCIN) IVPB 1000 mg/200 mL premix  Status:  Discontinued     1,000 mg 200 mL/hr over 60 Minutes Intravenous Every 48 hours 05/19/15 0801 05/19/15 0918   05/19/15 2130   ceFEPIme (MAXIPIME) 1 g in dextrose 5 % 50 mL IVPB     1 g 100 mL/hr over 30 Minutes Intravenous Every 24 hours 05/19/15 0804     05/19/15 1500  vancomycin (VANCOCIN) IVPB 1000 mg/200 mL premix  Status:  Discontinued     1,000 mg 200 mL/hr over 60 Minutes Intravenous Every 24 hours 05/18/15 1425 05/19/15 0801   05/18/15 2200  ceFEPIme (MAXIPIME) 2 g in dextrose 5 % 50 mL IVPB  Status:  Discontinued     2 g 100 mL/hr over 30 Minutes Intravenous Every 24 hours 05/18/15 0238 05/19/15 0804   05/18/15 1430  vancomycin (VANCOCIN) 1,500 mg in sodium chloride 0.9 % 500 mL IVPB     1,500 mg 250 mL/hr over 120 Minutes Intravenous  Once 05/18/15 1425 05/18/15 1825   05/18/15 0245  ceFEPIme (MAXIPIME) 2 g in dextrose 5 % 50 mL IVPB     2 g 100 mL/hr over 30 Minutes Intravenous  Once 05/18/15 0236 05/18/15 0315   05/18/15 0130  cefTRIAXone (ROCEPHIN) 1 g in dextrose 5 % 50 mL IVPB  Status:  Discontinued     1 g 100 mL/hr over 30 Minutes Intravenous  Once 05/18/15 0127 05/18/15 0236        Objective:   Filed Vitals:   05/19/15 0900 05/19/15 1000 05/19/15 1100 05/19/15 1149  BP: 106/69 102/74 100/67   Pulse: 79 98 77   Temp:    98.4 F (36.9 C)  TempSrc:    Oral  Resp: 17 16 18    Height:      Weight:      SpO2: 99% 95% 96%     Wt Readings from Last 3 Encounters:  05/19/15 82.5 kg (181 lb 14.1 oz)  05/16/15 81.375 kg (179 lb 6.4 oz)  05/16/15 81.92 kg (180 lb 9.6 oz)     Intake/Output Summary (Last 24 hours) at 05/19/15 1407 Last data filed at 05/19/15 1100  Gross per  24 hour  Intake 1324.75 ml  Output    802 ml  Net 522.75 ml     Physical Exam  Awake Alert, Oriented X 3, Supple Neck,No JVD,  Symmetrical Chest wall movement, Good air movement bilaterally, No wheezing  No Gallops,Rubs or new Murmurs, No Parasternal Heave +ve B.Sounds, Abd Soft,significant right CVA tenderness, No rebound - guarding or rigidity. No Cyanosis,, bruising, +2 edema bilaterally   Data Review:     CBC  Recent Labs Lab 05/16/15 0831 05/17/15 2329 05/18/15 0442 05/18/15 1510 05/19/15 0232  WBC 3.5* 2.3* 1.9* 2.2* 3.6*  HGB 9.7* 8.8* 7.5* 8.6* 8.8*  HCT 30.0* 27.8* 23.5* 26.7* 27.7*  PLT 30* 26* 20* 19* 21*  MCV 89.8 89.1 88.7 90.5 87.7  MCH 29.0 28.2 28.3 29.2 27.8  MCHC 32.3 31.7 31.9 32.2 31.8  RDW 17.9* 17.9* 18.0* 17.8* 17.7*  LYMPHSABS 0.5* 0.1*  --   --  0.6*  MONOABS 0.2 0.1  --   --  0.4  EOSABS 0.0 0.0  --   --  0.1  BASOSABS 0.0 0.0  --   --  0.0    Chemistries   Recent Labs Lab 05/13/15 0935 05/17/15 2329 05/18/15 0442 05/19/15 0232  NA 135 140 141 139  K 4.0 3.8 3.7 3.5  CL 97 103 103 103  CO2 29 25 25 24   GLUCOSE 144* 186* 173* 111*  BUN 56* 60* 63* 68*  CREATININE 1.97* 2.20* 2.38* 2.78*  CALCIUM 8.9 8.9 8.2* 8.3*  AST 28  --  29  --   ALT 23  --  25  --   ALKPHOS 101  --  85  --   BILITOT 0.8  --  1.3*  --    ------------------------------------------------------------------------------------------------------------------ No results for input(s): CHOL, HDL, LDLCALC, TRIG, CHOLHDL, LDLDIRECT in the last 72 hours.  Lab Results  Component Value Date   HGBA1C 5.7 05/09/2015   ------------------------------------------------------------------------------------------------------------------  Recent Labs  05/18/15 0442  TSH 2.664   ------------------------------------------------------------------------------------------------------------------ No results for input(s): VITAMINB12, FOLATE, FERRITIN, TIBC, IRON, RETICCTPCT in the last 72 hours.  Coagulation profile No results for input(s): INR, PROTIME in the last 168 hours.  No results for input(s): DDIMER in the last 72 hours.  Cardiac Enzymes No results for input(s): CKMB, TROPONINI, MYOGLOBIN in the last 168 hours.  Invalid input(s): CK ------------------------------------------------------------------------------------------------------------------    Component Value  Date/Time   BNP 674.3* 05/17/2015 2329    Inpatient Medications  Scheduled Meds: . allopurinol  100 mg Oral Daily  . amiodarone  200 mg Oral Daily  . ceFEPime (MAXIPIME) IV  1 g Intravenous Q24H  . ferrous sulfate  325 mg Oral BID WC  . levothyroxine  25 mcg Oral QAC breakfast  . sertraline  50 mg Oral QHS  . sodium chloride flush  3 mL Intravenous Q12H   Continuous Infusions: . phenylephrine (NEO-SYNEPHRINE) Adult infusion Stopped (05/19/15 0430)   PRN Meds:.acetaminophen **OR** acetaminophen  Micro Results Recent Results (from the past 240 hour(s))  Urine culture     Status: Abnormal (Preliminary result)   Collection Time: 05/17/15 11:25 PM  Result Value Ref Range Status   Specimen Description URINE, CLEAN CATCH  Final   Special Requests Normal  Final   Culture >=100,000 COLONIES/mL ESCHERICHIA COLI (A)  Final   Report Status PENDING  Incomplete  MRSA PCR Screening     Status: None   Collection Time: 05/18/15  3:12 PM  Result Value Ref Range Status   MRSA by  PCR NEGATIVE NEGATIVE Final    Comment:        The GeneXpert MRSA Assay (FDA approved for NASAL specimens only), is one component of a comprehensive MRSA colonization surveillance program. It is not intended to diagnose MRSA infection nor to guide or monitor treatment for MRSA infections.     Radiology Reports Dg Chest 2 View  05/17/2015  CLINICAL DATA:  82 year old male with shortness of breath and back pain EXAM: CHEST  2 VIEW COMPARISON:  Radiograph dated 05/09/2015 and CT dated 04/12/2014 FINDINGS: Two views of the chest do not demonstrate a focal consolidation. There is no pleural effusion or pneumothorax. Stable cardiac silhouette. Aortic valve replacement. Bilateral hilar calcified granuloma as well as small scattered pulmonary calcified nodules. No acute osseous pathology. Upper abdominal surgical clips. IMPRESSION: No active cardiopulmonary disease. Electronically Signed   By: Anner Crete M.D.   On:  05/17/2015 23:35   Dg Chest 2 View  05/09/2015  CLINICAL DATA:  Cough for 1 week, low-grade fever EXAM: CHEST  2 VIEW COMPARISON:  10/04/2014 FINDINGS: Cardiac shadow is at the upper limits of normal in size but stable. Changes of prior granulomatous disease are noted. No focal infiltrate or sizable effusion is seen. Changes of prior aortic valve replacement are seen. No acute bony abnormality is noted. IMPRESSION: No active cardiopulmonary disease. Electronically Signed   By: Inez Catalina M.D.   On: 05/09/2015 14:17   Ct Renal Stone Study  05/18/2015  CLINICAL DATA:  Right flank pain and vomiting tonight. EXAM: CT ABDOMEN AND PELVIS WITHOUT CONTRAST TECHNIQUE: Multidetector CT imaging of the abdomen and pelvis was performed following the standard protocol without IV contrast. COMPARISON:  None. FINDINGS: There are no urinary calculi. There is no hydronephrosis or ureteral dilatation. Both kidneys are moderately atrophic. There are several cysts bilaterally, not significantly changed from 04/22/2014. There is massive splenomegaly, or 23 cm craniocaudal. This is unchanged. There are unremarkable unenhanced appearances of the liver, bile ducts, pancreas and adrenals. There are multiple upper abdominal varices. There is small volume ascites, unchanged. Bowel is unremarkable. No acute inflammatory changes are evident in the abdomen or pelvis. There is no significant abnormality in the lower chest. There is no significant musculoskeletal lesion. IMPRESSION: Small volume ascites. Massive splenomegaly. Upper abdominal varices. These findings are not significantly changed from 04/22/2014. Kidneys appear atrophic but no evidence of calculi and no evidence of obstructive uropathy. Electronically Signed   By: Andreas Newport M.D.   On: 05/18/2015 01:05    Time Spent in minutes  35 minutes   Khloee Garza M.D on 05/19/2015 at 2:07 PM  Between 7am to 7pm - Pager - [phone]  After 7pm go to [email or web address] -  password [facility]  [facility] -  Office  [phone]

## 2015-05-19 NOTE — Progress Notes (Signed)
NURSING PROGRESS NOTE  [name] [id number] Transfer Data: 05/19/2015 6:20 PM Attending Provider: Albertine Patricia, MD [id number] Lorretta Harp, MD Code Status: DNR   [name] is a 82 y.o. male patient transferred  -No acute distress noted.  -No complaints of shortness of breath.  -No complaints of chest pain.   Cardiac Monitoring: Box # 15 in place.   Blood pressure 137/81, pulse 87, temperature 98.3 F (36.8 C), temperature source Oral, resp. rate 19, height 5\' 10"  (1.778 m), weight 82.5 kg (181 lb 14.1 oz), SpO2 100 %.   IV Fluids:  IV in place, occlusive dsg intact without redness,   Allergies:  Penicillins and Neomycin-bacitracin zn-polymyx  Past Medical History:   has a past medical history of Stroke ([employer]); PAF (paroxysmal atrial fibrillation) ([place]); Bradycardia; Aortic stenosis; Hypertension; Pancytopenia ([place]); Action tremor; Thrombocytopenia ([place]); Colon polyp; Anxiety; Splenomegaly; Gout; GERD (gastroesophageal reflux disease); Hypothyroidism; Leukopenia; Degenerative disc disease; Joint effusion, knee; Synovial cyst of popliteal space; Cellulitis of left leg (10/11-16/2012); CKD (chronic kidney disease), stage III; Chronic diastolic CHF (congestive heart failure) ([place]); Bacteremia; High cholesterol; Diabetes mellitus; History of blood transfusion (01/2011; 11/2012); H/O hiatal hernia; Throat cancer ([place]); Diverticulosis; S/P TAVR (transcatheter aortic valve replacement); QT prolongation; and Hypotension.  Past Surgical History:   has past surgical history that includes Cholecystectomy; joint effusion; Aortic valve replacement (01/23/2011); TEE without cardioversion (N/A, 12/15/2012); Cardiac valve replacement; Inguinal hernia repair (Right); Excisional hemorrhoidectomy; Cardiac catheterization; Surgery scrotal / testicular; Microlaryngoscopy with co2 laser and excision of vocal cord lesion (1980's); TEE without cardioversion (N/A, 03/26/2014); Transcatheter aortic valve  replacement, transfemoral (Right, 05/14/2014); and TEE without cardioversion (N/A, 05/14/2014).  Social History:   reports that he quit smoking about 53 years ago. His smoking use included Cigarettes. He has a 18 pack-year smoking history. He has never used smokeless tobacco. He reports that he does not drink alcohol or use illicit drugs.   Patient/Family orientated to room. Information packet given to patient/family. Admission inpatient armband information verified with patient/family to include name and date of birth and placed on patient arm. Side rails up x 2, fall assessment and education completed with patient/family. Patient/family able to verbalize understanding of risk associated with falls and verbalized understanding to call for assistance before getting out of bed. Call light within reach. Patient/family able to voice and demonstrate understanding of unit orientation instructions.    Will continue to evaluate and treat per MD orders.

## 2015-05-19 NOTE — Progress Notes (Signed)
Pharmacy Antibiotic Note  [name] is a 82 y.o. male admitted on 05/17/2015 with UTI.  Pharmacy has been consulted for Cefepime dosing. WBC low at 3.6, AF. SCr worsening, 2.38>2.78, CrCl~17. Received vanc 1500mg  loading dose in the ED on 5/10.  Plan: -Vanc to 1g IV q48h to start tomorrow at 1600 for renal function -Cefepime to 1g IV q24h -F/U urine culture for directed therapy, vanc levels as indicated  Temp (24hrs), Avg:98.6 F (37 C), Min:98 F (36.7 C), Max:99 F (37.2 C)   Recent Labs Lab 05/13/15 0935 05/16/15 0831 05/17/15 2329 05/18/15 0241 05/18/15 0442 05/18/15 0516 05/18/15 1510 05/18/15 1909 05/19/15 0232  WBC  --  3.5* 2.3*  --  1.9*  --  2.2*  --  3.6*  CREATININE 1.97*  --  2.20*  --  2.38*  --   --   --  2.78*  LATICACIDVEN  --   --   --  1.5  --  1.3 2.9* 2.7*  --     Estimated Creatinine Clearance: 17.9 mL/min (by C-G formula based on Cr of 2.78).    Allergies  Allergen Reactions  . Penicillins Other (See Comments)    Does not remember reaction (~year 1950)  . Neomycin-Bacitracin Zn-Polymyx Other (See Comments)    ? Reaction (thinks he remembers redness)    Elicia Lamp, PharmD, [facility] Clinical Pharmacist Pager [phone] 05/19/2015 8:01 AM

## 2015-05-19 NOTE — Progress Notes (Signed)
Report received from Ander Purpura, RN for transfer to [phone]

## 2015-05-20 DIAGNOSIS — I5033 Acute on chronic diastolic (congestive) heart failure: Secondary | ICD-10-CM

## 2015-05-20 LAB — URINE CULTURE
Culture: >=100000 — AB
Special Requests: NORMAL

## 2015-05-20 LAB — CBC
HCT: 27.2 % — ABNORMAL LOW (ref 39.0–52.0)
Hemoglobin: 8.6 g/dL — ABNORMAL LOW (ref 13.0–17.0)
MCH: 27.8 pg (ref 26.0–34.0)
MCHC: 31.6 g/dL (ref 30.0–36.0)
MCV: 88 fL (ref 78.0–100.0)
PLATELETS: 25 10*3/uL — AB (ref 150–400)
RBC: 3.09 MIL/uL — ABNORMAL LOW (ref 4.22–5.81)
RDW: 17.5 % — AB (ref 11.5–15.5)
WBC: 1.8 10*3/uL — AB (ref 4.0–10.5)

## 2015-05-20 LAB — BASIC METABOLIC PANEL
Anion gap: 13 (ref 5–15)
BUN: 67 mg/dL — ABNORMAL HIGH (ref 6–20)
CHLORIDE: 104 mmol/L (ref 101–111)
CO2: 23 mmol/L (ref 22–32)
CREATININE: 2.42 mg/dL — AB (ref 0.61–1.24)
Calcium: 8.7 mg/dL — ABNORMAL LOW (ref 8.9–10.3)
GFR calc non Af Amer: 22 mL/min — ABNORMAL LOW (ref >60)
GFR, EST AFRICAN AMERICAN: 25 mL/min — AB (ref >60)
GLUCOSE: 138 mg/dL — AB (ref 65–99)
Potassium: 3.7 mmol/L (ref 3.5–5.1)
Sodium: 140 mmol/L (ref 135–145)

## 2015-05-20 LAB — PROCALCITONIN: Procalcitonin: 15.52 ng/mL

## 2015-05-20 MED ORDER — DEXTROSE 5 % IV SOLN
1.0000 g | INTRAVENOUS | Status: DC
  Administered 2015-05-20: 1 g via INTRAVENOUS
  Filled 2015-05-20 (×2): qty 10

## 2015-05-20 MED ORDER — ZOLPIDEM TARTRATE 5 MG PO TABS
5.0000 mg | ORAL_TABLET | Freq: Every evening | ORAL | Status: DC | PRN
  Administered 2015-05-20: 5 mg via ORAL
  Filled 2015-05-20: qty 1

## 2015-05-20 NOTE — Progress Notes (Signed)
Pharmacy Antibiotic Note  [name] is a 82 y.o. male admitted on 05/17/2015 with UTI.   Continues on Cefepime  Urine culture positive for E Coli sensitive to Ancef / Rocephin .  Plan: Streamline therapy to Rocephin or Ancef  / PO options include Keflex or Ceftin Continue to follow  Temp (24hrs), Avg:98.3 F (36.8 C), Min:97.6 F (36.4 C), Max:99.2 F (37.3 C)   Recent Labs Lab 05/13/15 0935  05/17/15 2329 05/18/15 0241 05/18/15 0442 05/18/15 0516 05/18/15 1510 05/18/15 1909 05/19/15 0232 05/20/15 0557  WBC  --   < > 2.3*  --  1.9*  --  2.2*  --  3.6* 1.8*  CREATININE 1.97*  --  2.20*  --  2.38*  --   --   --  2.78* 2.42*  LATICACIDVEN  --   --   --  1.5  --  1.3 2.9* 2.7*  --   --   < > = values in this interval not displayed.  Estimated Creatinine Clearance: 20.5 mL/min (by C-G formula based on Cr of 2.42).    Allergies  Allergen Reactions  . Penicillins Other (See Comments)    Does not remember reaction (~year 1950)  . Neomycin-Bacitracin Zn-Polymyx Other (See Comments)    ? Reaction (thinks he remembers redness)   Thank you Anette Guarneri, PharmD [phone] 05/20/2015 9:30 AM

## 2015-05-20 NOTE — Evaluation (Signed)
Physical Therapy Evaluation Patient Details Name: [name] MRN: [id number] DOB: 09/19/1922 Today's Date: 05/20/2015   History of Present Illness  82 y.o. male with a past medical history significant for lymphoproliferative disorder with severe pancytopenia followed by Dr. Benay Spice, diastolic CHF, chronic EF 123456, hypothyroidism, pAF not on warfarin, CKD and aortic stenosis s/p TAVR in 2016 who presents with flank pain in setting of UTI.  Clinical Impression  Patient seen for mobility evaluation. Patient mobilizing without assist, no device required. Demonstrates modest deficits in mobility but overall steady. Educated patient on energy conservation and safety with mobility. At this time, no further acute PT needs. Will sign off.    Follow Up Recommendations No PT follow up    Equipment Recommendations  None recommended by PT    Recommendations for Other Services       Precautions / Restrictions Precautions Precautions: Fall Restrictions Weight Bearing Restrictions: No      Mobility  Bed Mobility               General bed mobility comments: received in chair  Transfers Overall transfer level: Independent Equipment used: None             General transfer comment: increased time to perform, no device required no physical assist  Ambulation/Gait Ambulation/Gait assistance: Independent Ambulation Distance (Feet): 200 Feet Assistive device: None Gait Pattern/deviations: Step-through pattern;Decreased stride length;Drifts right/left Gait velocity: decreased   General Gait Details: modest instability noted with ambulation, patient with no significant LOB. Able to perform dynamic tasks despite modest instability. No physical assist required  Stairs            Wheelchair Mobility    Modified Rankin (Stroke Patients Only)       Balance Overall balance assessment: No apparent balance deficits (not formally assessed)                                           Pertinent Vitals/Pain Pain Assessment: No/denies pain    Home Living Family/patient expects to be discharged to:: Private residence Living Arrangements: Alone Available Help at Discharge: Available PRN/intermittently Type of Home: House Home Access: Stairs to enter Entrance Stairs-Rails: Left Entrance Stairs-Number of Steps: 3 Home Layout: One level Home Equipment: Grab bars - tub/shower Additional Comments: Lives with wife who is currently admitted to hospital.  Retired from [employer].    Prior Function Level of Independence: Independent         Comments: drives     Hand Dominance   Dominant Hand: Right    Extremity/Trunk Assessment   Upper Extremity Assessment: Overall WFL for tasks assessed           Lower Extremity Assessment: Overall WFL for tasks assessed         Communication   Communication: HOH  Cognition Arousal/Alertness: Awake/alert Behavior During Therapy: WFL for tasks assessed/performed Overall Cognitive Status: Within Functional Limits for tasks assessed                      General Comments      Exercises        Assessment/Plan    PT Assessment Patent does not need any further PT services  PT Diagnosis Difficulty walking;Acute pain   PT Problem List    PT Treatment Interventions     PT Goals (Current goals can be found in the  Care Plan section) Acute Rehab PT Goals PT Goal Formulation: All assessment and education complete, DC therapy    Frequency     Barriers to discharge        Co-evaluation               End of Session Equipment Utilized During Treatment: Gait belt Activity Tolerance: Patient tolerated treatment well Patient left: in chair;with call bell/phone within reach Nurse Communication: Mobility status         Time: [id number] PT Time Calculation (min) (ACUTE ONLY): 16 min   Charges:   PT Evaluation $PT Eval Low Complexity: 1 Procedure     PT G CodesDuncan Dull 05/20/2015, 4:39 PM  Alben Deeds, [place] DPT  [phone]

## 2015-05-20 NOTE — Progress Notes (Signed)
Pharmacy Antibiotic Note  [name] is a 82 y.o. male admitted on 05/17/2015 with flank pain concerning for UTI/pyelo. Urine cultures have resulted as E.coli (pan-S except R-amp/unasyn/bactrim). Cultures have been discussed with Dr. Waldron Labs and plans are to transition the patient from Cefepime to Rocephin.   Plan: 1. Start Rocephin 1g IV every 24 hours 2. Pharmacy will sign off and monitor peripherally  Height: 5\' 10"  (177.8 cm) Weight: 183 lb 3.2 oz (83.1 kg) IBW/kg (Calculated) : 73  Temp (24hrs), Avg:98.4 F (36.9 C), Min:97.6 F (36.4 C), Max:99.2 F (37.3 C)   Recent Labs Lab 05/17/15 2329 05/18/15 0241 05/18/15 0442 05/18/15 0516 05/18/15 1510 05/18/15 1909 05/19/15 0232 05/20/15 0557  WBC 2.3*  --  1.9*  --  2.2*  --  3.6* 1.8*  CREATININE 2.20*  --  2.38*  --   --   --  2.78* 2.42*  LATICACIDVEN  --  1.5  --  1.3 2.9* 2.7*  --   --     Estimated Creatinine Clearance: 20.5 mL/min (by C-G formula based on Cr of 2.42).    Allergies  Allergen Reactions  . Penicillins Other (See Comments)    Does not remember reaction (~year 1950)  . Neomycin-Bacitracin Zn-Polymyx Other (See Comments)    ? Reaction (thinks he remembers redness)    Antimicrobials this admission: Vanc 5/10 x 1 dose Cefepime 5/10 >> 5/12 Ceftriaxone 5/12 >>  Dose adjustments this admission: n/a  Microbiology results: 5/9 UCx >> E. Coli  (pan-S except R-amp/unasyn/bactrim) 5/10 BCx >> ngtd 5/10 MRSA PCR >> neg  Thank you for allowing pharmacy to be a part of this patient's care.  Alycia Rossetti, PharmD, BCPS Clinical Pharmacist Pager: [phone] 05/20/2015 4:17 PM

## 2015-05-20 NOTE — Progress Notes (Signed)
PROGRESS NOTE                                                                                                                                                                                                             Patient Demographics:    [name], is a 82 y.o. male, DOB - 07/16/1922, [id number]  Admit date - 05/17/2015   Admitting Physician Edwin Dada, MD  Outpatient Primary MD for the patient is Binnie Rail, MD  LOS - 2    Chief Complaint  Patient presents with  . Back Pain  . Shortness of Breath       Brief Narrative  82 y.o. male with a past medical history significant for lymphoproliferative disorder with severe pancytopenia followed by Dr. Benay Spice, diastolic CHF, chronic EF [id number], hypothyroidism, pAF not on warfarin, CKD and aortic stenosis s/p TAVR in 2016 who presents with flank pain, Patient afebrile, but hypotensive, tachycardic and slightly hypoxic, with worsening renal function, admitted for Acute pyelonephritis, patient with known chronic pancytopenia, initially hypotensive despite 1 unit PRBC transfusion for his anemia, initially on peripheral pressors,stable VS, transferred from ICU to medical floor on 5/11    Subjective:    [name] today has, No headache, No chest pain, No abdominal pain - No Nausea,reports feeling much better today, but reports poor nigh sleep.   Assessment  & Plan :    Principal Problem:   UTI (lower urinary tract infection) Active Problems:   Essential hypertension   Other pancytopenia (HCC)   Hypothyroidism   Protein-calorie malnutrition, severe (HCC)   PAF (paroxysmal atrial fibrillation) (HCC)   CKD (chronic kidney disease), stage III   Depression   Hypotension   Acute on chronic diastolic CHF (congestive heart failure) ([place])   Septic shock (HCC)   Sepsis due to Escherichia coli (HCC)  Sepsis on the setting of acute pyelonephritis - Meet sepsis criteria on admission  giving hypotension, tachycardia and elevated lactic acid - CT abdomen and pelvis with no stone or obstruction - Recent hospitalization at [place] secondary to sepsis from UTI - Initially  on IV cefepime and vancomycin, stopped vancomycin given negative cultures, and culture growing Escherichia coli, sensitive to rcephine, will change to rocephine. - Hypotension initially, required pressors , currently blood pressure stable.  Acute on chronic diastolic CHF - Patient with evidence  of volume overload, giving significant lower extremity edema(at baseline per his daughter). - We'll start on gentle diuresis fell function remained stable and blood pressure holds.  Chronic Pancytopenia - Discussed with Dr. Learta Codding, bleeding in the setting of chronic lymphoproliferative disorder, splenomegaly versus liver cirrhosis - Transfuse as needed, received 1 unit PRBC on 5/10 hemoglobin of 7.5, platelet count is low, but no evidence of bleed, no indication for platelet transfusion. - Monitor CBC closely  HTN  -  continue to hold propranolol and amlodipine   Hypothyroidism - Continue levothyroxine   Depression - Patient  denies any suicidal ideation - Continue sertraline  Severe PCM:  - Continue Ensure   AKI on CKD stage III, baseline Cr 1.9-2.2:  - Creatinine is 2.7 today, this most likely related to ATN from hypotension   PAF: - CHADS2-Vasc 4-5. Not on warfarin because of pancytopenia.  - Continue amiodarone  Right inguinal hernia: - Monitor for now    Code Status : DNR  Family Communication  : Daughter at bedside  Disposition Plan  : Will need 2-3 days of IV antibiotics, will need PT consult  Consults  :  PCCM  Procedures  : none  DVT Prophylaxis  :   SCDs   Lab Results  Component Value Date   PLT 25* 05/20/2015    Antibiotics  :    Anti-infectives    Start     Dose/Rate Route Frequency Ordered Stop   05/20/15 2000  cefTRIAXone (ROCEPHIN) 1 g in dextrose 5 % 50 mL  IVPB     1 g 100 mL/hr over 30 Minutes Intravenous Every 24 hours 05/20/15 1618     05/20/15 1600  vancomycin (VANCOCIN) IVPB 1000 mg/200 mL premix  Status:  Discontinued     1,000 mg 200 mL/hr over 60 Minutes Intravenous Every 48 hours 05/19/15 0801 05/19/15 0918   05/19/15 2130  ceFEPIme (MAXIPIME) 1 g in dextrose 5 % 50 mL IVPB  Status:  Discontinued     1 g 100 mL/hr over 30 Minutes Intravenous Every 24 hours 05/19/15 0804 05/20/15 1618   05/19/15 1500  vancomycin (VANCOCIN) IVPB 1000 mg/200 mL premix  Status:  Discontinued     1,000 mg 200 mL/hr over 60 Minutes Intravenous Every 24 hours 05/18/15 1425 05/19/15 0801   05/18/15 2200  ceFEPIme (MAXIPIME) 2 g in dextrose 5 % 50 mL IVPB  Status:  Discontinued     2 g 100 mL/hr over 30 Minutes Intravenous Every 24 hours 05/18/15 0238 05/19/15 0804   05/18/15 1430  vancomycin (VANCOCIN) 1,500 mg in sodium chloride 0.9 % 500 mL IVPB     1,500 mg 250 mL/hr over 120 Minutes Intravenous  Once 05/18/15 1425 05/18/15 1825   05/18/15 0245  ceFEPIme (MAXIPIME) 2 g in dextrose 5 % 50 mL IVPB     2 g 100 mL/hr over 30 Minutes Intravenous  Once 05/18/15 0236 05/18/15 0315   05/18/15 0130  cefTRIAXone (ROCEPHIN) 1 g in dextrose 5 % 50 mL IVPB  Status:  Discontinued     1 g 100 mL/hr over 30 Minutes Intravenous  Once 05/18/15 0127 05/18/15 0236        Objective:   Filed Vitals:   05/20/15 0500 05/20/15 0523 05/20/15 0607 05/20/15 1454  BP:  89/59 118/79 111/68  Pulse:  86 86 88  Temp:  97.6 F (36.4 C) 98.2 F (36.8 C) 98.5 F (36.9 C)  TempSrc:   Oral   Resp:  20 18 18  Height:      Weight: 83.1 kg (183 lb 3.2 oz)     SpO2:  99% 100% 99%    Wt Readings from Last 3 Encounters:  05/20/15 83.1 kg (183 lb 3.2 oz)  05/16/15 81.375 kg (179 lb 6.4 oz)  05/16/15 81.92 kg (180 lb 9.6 oz)     Intake/Output Summary (Last 24 hours) at 05/20/15 1738 Last data filed at 05/20/15 1622  Gross per 24 hour  Intake    580 ml  Output   1060 ml    Net   -480 ml     Physical Exam  Awake Alert, Oriented X 3, Supple Neck,No JVD,  Symmetrical Chest wall movement, Good air movement bilaterally, No wheezing  No Gallops,Rubs or new Murmurs, No Parasternal Heave +ve B.Sounds, Abd Soft,significant right CVA tenderness, No rebound - guarding or rigidity. No Cyanosis,, bruising, +2 edema bilaterally   Data Review:    CBC  Recent Labs Lab 05/16/15 0831 05/17/15 2329 05/18/15 0442 05/18/15 1510 05/19/15 0232 05/20/15 0557  WBC 3.5* 2.3* 1.9* 2.2* 3.6* 1.8*  HGB 9.7* 8.8* 7.5* 8.6* 8.8* 8.6*  HCT 30.0* 27.8* 23.5* 26.7* 27.7* 27.2*  PLT 30* 26* 20* 19* 21* 25*  MCV 89.8 89.1 88.7 90.5 87.7 88.0  MCH 29.0 28.2 28.3 29.2 27.8 27.8  MCHC 32.3 31.7 31.9 32.2 31.8 31.6  RDW 17.9* 17.9* 18.0* 17.8* 17.7* 17.5*  LYMPHSABS 0.5* 0.1*  --   --  0.6*  --   MONOABS 0.2 0.1  --   --  0.4  --   EOSABS 0.0 0.0  --   --  0.1  --   BASOSABS 0.0 0.0  --   --  0.0  --     Chemistries   Recent Labs Lab 05/17/15 2329 05/18/15 0442 05/19/15 0232 05/20/15 0557  NA 140 141 139 140  K 3.8 3.7 3.5 3.7  CL 103 103 103 104  CO2 25 25 24 23   GLUCOSE 186* 173* 111* 138*  BUN 60* 63* 68* 67*  CREATININE 2.20* 2.38* 2.78* 2.42*  CALCIUM 8.9 8.2* 8.3* 8.7*  AST  --  29  --   --   ALT  --  25  --   --   ALKPHOS  --  85  --   --   BILITOT  --  1.3*  --   --    ------------------------------------------------------------------------------------------------------------------ No results for input(s): CHOL, HDL, LDLCALC, TRIG, CHOLHDL, LDLDIRECT in the last 72 hours.  Lab Results  Component Value Date   HGBA1C 5.7 05/09/2015   ------------------------------------------------------------------------------------------------------------------  Recent Labs  05/18/15 0442  TSH 2.664   ------------------------------------------------------------------------------------------------------------------ No results for input(s): VITAMINB12,  FOLATE, FERRITIN, TIBC, IRON, RETICCTPCT in the last 72 hours.  Coagulation profile No results for input(s): INR, PROTIME in the last 168 hours.  No results for input(s): DDIMER in the last 72 hours.  Cardiac Enzymes No results for input(s): CKMB, TROPONINI, MYOGLOBIN in the last 168 hours.  Invalid input(s): CK ------------------------------------------------------------------------------------------------------------------    Component Value Date/Time   BNP 674.3* 05/17/2015 2329    Inpatient Medications  Scheduled Meds: . allopurinol  100 mg Oral Daily  . amiodarone  200 mg Oral Daily  . cefTRIAXone (ROCEPHIN)  IV  1 g Intravenous Q24H  . ferrous sulfate  325 mg Oral BID WC  . levothyroxine  25 mcg Oral QAC breakfast  . sertraline  50 mg Oral QHS  . sodium chloride flush  3 mL Intravenous Q12H  Continuous Infusions:   PRN Meds:.acetaminophen **OR** acetaminophen, zolpidem  Micro Results Recent Results (from the past 240 hour(s))  Urine culture     Status: Abnormal   Collection Time: 05/17/15 11:25 PM  Result Value Ref Range Status   Specimen Description URINE, CLEAN CATCH  Final   Special Requests Normal  Final   Culture >=100,000 COLONIES/mL ESCHERICHIA COLI (A)  Final   Report Status 05/20/2015 FINAL  Final   Organism ID, Bacteria ESCHERICHIA COLI (A)  Final      Susceptibility   Escherichia coli - MIC*    AMPICILLIN >=32 RESISTANT Resistant     CEFAZOLIN 8 SENSITIVE Sensitive     CEFTRIAXONE <=1 SENSITIVE Sensitive     CIPROFLOXACIN <=0.25 SENSITIVE Sensitive     GENTAMICIN <=1 SENSITIVE Sensitive     IMIPENEM <=0.25 SENSITIVE Sensitive     NITROFURANTOIN <=16 SENSITIVE Sensitive     TRIMETH/SULFA >=320 RESISTANT Resistant     AMPICILLIN/SULBACTAM >=32 RESISTANT Resistant     PIP/TAZO 8 SENSITIVE Sensitive     * >=100,000 COLONIES/mL ESCHERICHIA COLI  Culture, blood (routine x 2)     Status: None (Preliminary result)   Collection Time: 05/18/15  2:04 AM    Result Value Ref Range Status   Specimen Description BLOOD RIGHT ARM  Final   Special Requests BOTTLES DRAWN AEROBIC AND ANAEROBIC 5ML  Final   Culture NO GROWTH 2 DAYS  Final   Report Status PENDING  Incomplete  Culture, blood (routine x 2)     Status: None (Preliminary result)   Collection Time: 05/18/15  2:09 AM  Result Value Ref Range Status   Specimen Description BLOOD RIGHT HAND  Final   Special Requests BOTTLES DRAWN AEROBIC AND ANAEROBIC 5ML  Final   Culture NO GROWTH 2 DAYS  Final   Report Status PENDING  Incomplete  MRSA PCR Screening     Status: None   Collection Time: 05/18/15  3:12 PM  Result Value Ref Range Status   MRSA by PCR NEGATIVE NEGATIVE Final    Comment:        The GeneXpert MRSA Assay (FDA approved for NASAL specimens only), is one component of a comprehensive MRSA colonization surveillance program. It is not intended to diagnose MRSA infection nor to guide or monitor treatment for MRSA infections.     Radiology Reports Dg Chest 2 View  05/17/2015  CLINICAL DATA:  82 year old male with shortness of breath and back pain EXAM: CHEST  2 VIEW COMPARISON:  Radiograph dated 05/09/2015 and CT dated 04/12/2014 FINDINGS: Two views of the chest do not demonstrate a focal consolidation. There is no pleural effusion or pneumothorax. Stable cardiac silhouette. Aortic valve replacement. Bilateral hilar calcified granuloma as well as small scattered pulmonary calcified nodules. No acute osseous pathology. Upper abdominal surgical clips. IMPRESSION: No active cardiopulmonary disease. Electronically Signed   By: Anner Crete M.D.   On: 05/17/2015 23:35   Dg Chest 2 View  05/09/2015  CLINICAL DATA:  Cough for 1 week, low-grade fever EXAM: CHEST  2 VIEW COMPARISON:  10/04/2014 FINDINGS: Cardiac shadow is at the upper limits of normal in size but stable. Changes of prior granulomatous disease are noted. No focal infiltrate or sizable effusion is seen. Changes of prior aortic  valve replacement are seen. No acute bony abnormality is noted. IMPRESSION: No active cardiopulmonary disease. Electronically Signed   By: Inez Catalina M.D.   On: 05/09/2015 14:17   Ct Renal Stone Study  05/18/2015  CLINICAL DATA:  Right flank pain and vomiting tonight. EXAM: CT ABDOMEN AND PELVIS WITHOUT CONTRAST TECHNIQUE: Multidetector CT imaging of the abdomen and pelvis was performed following the standard protocol without IV contrast. COMPARISON:  None. FINDINGS: There are no urinary calculi. There is no hydronephrosis or ureteral dilatation. Both kidneys are moderately atrophic. There are several cysts bilaterally, not significantly changed from 04/22/2014. There is massive splenomegaly, or 23 cm craniocaudal. This is unchanged. There are unremarkable unenhanced appearances of the liver, bile ducts, pancreas and adrenals. There are multiple upper abdominal varices. There is small volume ascites, unchanged. Bowel is unremarkable. No acute inflammatory changes are evident in the abdomen or pelvis. There is no significant abnormality in the lower chest. There is no significant musculoskeletal lesion. IMPRESSION: Small volume ascites. Massive splenomegaly. Upper abdominal varices. These findings are not significantly changed from 04/22/2014. Kidneys appear atrophic but no evidence of calculi and no evidence of obstructive uropathy. Electronically Signed   By: Andreas Newport M.D.   On: 05/18/2015 01:05    Time Spent in minutes  25 minutes   Omah Dewalt M.D on 05/20/2015 at 5:38 PM  Between 7am to 7pm - Pager - [phone]  After 7pm go to [email or web address] - password [facility]  [facility] -  Office  [phone]

## 2015-05-20 NOTE — Care Management Note (Signed)
Case Management Note  Patient Details  Name: [name] MRN: [id number] Date of Birth: 1922/07/08  Subjective/Objective:      Pt lives alone but dtr lives nearby and checks on pt frequently.  Reports pt is very active, was recently in [place] for a month, visiting another dtr, ended up being hospitalized there.  Pt has rollator and BSC.                        Expected Discharge Plan:  Home/Self Care  Discharge planning Services  CM Consult  Status of Service:  In process, will continue to follow  Girard Cooter, RN 05/20/2015, 9:38 AM

## 2015-05-21 LAB — BASIC METABOLIC PANEL
Anion gap: 13 (ref 5–15)
BUN: 60 mg/dL — AB (ref 6–20)
CHLORIDE: 107 mmol/L (ref 101–111)
CO2: 22 mmol/L (ref 22–32)
CREATININE: 1.99 mg/dL — AB (ref 0.61–1.24)
Calcium: 9.1 mg/dL (ref 8.9–10.3)
GFR calc Af Amer: 32 mL/min — ABNORMAL LOW (ref >60)
GFR calc non Af Amer: 28 mL/min — ABNORMAL LOW (ref >60)
Glucose, Bld: 155 mg/dL — ABNORMAL HIGH (ref 65–99)
Potassium: 4.1 mmol/L (ref 3.5–5.1)
SODIUM: 142 mmol/L (ref 135–145)

## 2015-05-21 LAB — CBC
HEMATOCRIT: 28.5 % — AB (ref 39.0–52.0)
HEMOGLOBIN: 9 g/dL — AB (ref 13.0–17.0)
MCH: 28.2 pg (ref 26.0–34.0)
MCHC: 31.6 g/dL (ref 30.0–36.0)
MCV: 89.3 fL (ref 78.0–100.0)
Platelets: 26 10*3/uL — CL (ref 150–400)
RBC: 3.19 MIL/uL — AB (ref 4.22–5.81)
RDW: 17.3 % — ABNORMAL HIGH (ref 11.5–15.5)
WBC: 1.5 10*3/uL — AB (ref 4.0–10.5)

## 2015-05-21 MED ORDER — CEFDINIR 300 MG PO CAPS
300.0000 mg | ORAL_CAPSULE | Freq: Every day | ORAL | Status: DC
Start: 2015-05-21 — End: 2015-06-28

## 2015-05-21 MED ORDER — SACCHAROMYCES BOULARDII 250 MG PO CAPS: 250.0000 mg | ORAL_CAPSULE | Freq: Two times a day (BID) | ORAL | Status: DC

## 2015-05-21 MED ORDER — CEFUROXIME AXETIL 250 MG PO TABS: 250.0000 mg | ORAL_TABLET | Freq: Every day | ORAL | Status: DC

## 2015-05-21 NOTE — Discharge Summary (Addendum)
[name], is a 82 y.o. male  DOB 05-28-1922  MRN [id number].  Admission date:  05/17/2015  Admitting Physician  Edwin Dada, MD  Discharge Date:  05/21/2015   Primary MD  Binnie Rail, MD  Recommendations for primary care physician for things to follow:  - Least check CBC, CMP during next visit - Patient to follow with urology as an outpatient regarding recurrent acute pyelonephritis  Admission Diagnosis  UTI (lower urinary tract infection) [N39.0] Acute on chronic congestive heart failure, unspecified congestive heart failure type ([place]) [I50.9]   Discharge Diagnosis  UTI (lower urinary tract infection) [N39.0] Acute on chronic congestive heart failure, unspecified congestive heart failure type ([place]) [I50.9]    Principal Problem:   UTI (lower urinary tract infection) Active Problems:   Essential hypertension   Other pancytopenia ([place])   Hypothyroidism   Protein-calorie malnutrition, severe (HCC)   PAF (paroxysmal atrial fibrillation) (HCC)   CKD (chronic kidney disease), stage III   Depression   Hypotension   Acute on chronic diastolic CHF (congestive heart failure) ([place])   Septic shock ([place])   Sepsis due to Escherichia coli [facility])      Past Medical History  Diagnosis Date  . Stroke ([place])   . PAF (paroxysmal atrial fibrillation) (HCC)     a. amiodarone therapy; not felt to be a candidate for anticoagulation with pancytopenia. b. Back in AF 09/2014 - decision was made to pursue rate control only.  . Bradycardia   . Aortic stenosis     a. Previously severe -> s/p minimally invasive tissue AVR with Dr. Evelina Dun at [facility] 01/2011 (pre-AVR cath with no obs CAD);  b. 05/2014 s/p TAVR (23 mm Edwards Sapien 3).  . Hypertension   . Pancytopenia ([place])     a. possible chronic lymphoproliferative disorder or splenic lymphoma.  . Action tremor   . Thrombocytopenia ([place])     Dr. Benay Spice  .  Colon polyp   . Anxiety   . Splenomegaly   . Gout   . GERD (gastroesophageal reflux disease)   . Hypothyroidism   . Leukopenia     Chronic pancytopenia  . Degenerative disc disease   . Joint effusion, knee     left knee  . Synovial cyst of popliteal space   . Cellulitis of left leg 10/11-16/2012  . CKD (chronic kidney disease), stage III   . Chronic diastolic CHF (congestive heart failure) ([place])     a. 12/15/2012 TEE: EF 60-65%, no veg.  . Bacteremia     a. 12/2012 - S bovis;  b. 12/2012 TEE w/o veg;  c. Seeing ID->Rocephin therapy extended to 01/25/2013 via PICC for possible endocarditis (No veg on TEE).  . High cholesterol   . Diabetes mellitus     "borderline" (01/05/2013)  . History of blood transfusion 01/2011; 11/2012  . H/O hiatal hernia   . Throat cancer ([place])     s/p lasered  . Diverticulosis   . S/P TAVR (transcatheter aortic valve replacement)  a. 05/14/2014 TAVR: 23 mm Edwards Sapien 3 transcatheter heart valve placed valve-in-valve for prosthetic valve dysfunction via open right transfemoral approach  . QT prolongation   . Hypotension     Past Surgical History  Procedure Laterality Date  . Cholecystectomy    . Joint effusion      left knee  . Aortic valve replacement  01/23/2011    via minimally invasive approach per Dr Evelina Dun, [facility]  . Tee without cardioversion N/A 12/15/2012    Procedure: TRANSESOPHAGEAL ECHOCARDIOGRAM (TEE);  Surgeon: [name], MD;  Location: [facility] ENDOSCOPY;  Service: Cardiovascular;  Laterality: N/A;  . Cardiac valve replacement    . Inguinal hernia repair Right   . Excisional hemorrhoidectomy    . Cardiac catheterization    . Surgery scrotal / testicular      "removed one" (01/05/2013)  . 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 Tee without cardioversion N/A 03/26/2014    Procedure:  TRANSESOPHAGEAL ECHOCARDIOGRAM (TEE);  Surgeon: Josue Hector, MD;  Location: [facility] ENDOSCOPY;  Service: Cardiovascular;  Laterality: N/A;  . Transcatheter aortic valve replacement, transfemoral Right 05/14/2014    Procedure: TRANSCATHETER AORTIC VALVE REPLACEMENT, TRANSFEMORAL;  Surgeon: [name], MD;  Location: [place];  Service: Open Heart Surgery;  Laterality: Right;  . Tee without cardioversion N/A 05/14/2014    Procedure: TRANSESOPHAGEAL ECHOCARDIOGRAM (TEE);  Surgeon: [name], MD;  Location: [place];  Service: Open Heart Surgery;  Laterality: N/A;       History of present illness and  Hospital Course:     Kindly see H&P for history of present illness and admission details, please review complete Labs, Consult reports and Test reports for all details in brief  HPI  from the history and physical done on the day of admission 05/18/2015  HPI: [name] is a 82 y.o. male with a past medical history significant for lymphoproliferative disorder with severe pancytopenia followed by Dr. Benay Spice, diastolic CHF, chronic EF 123456, hypothyroidism, pAF not on warfarin, CKD and aortic stenosis s/p TAVR in 2016 who presents with flank pain.  Patient was admitted in [facility] last month for UTI sepsis. He thinks he did not have a central line, but was admitted for about 1 week total to the ICU. He has been back home for about two weeks, going about his usual routine. Has had persistent leg swelling (admitted to hospital in [facility] with weight 173, discharged at 194), and saw his PCP two days ago who started metolazone.   Now, last ngiht, the patient started to develop RIGHT flank pain after going out to supper with a friend. Later, he had chills/shakes in bed, kept having to urinate, and every time he breathed he had "kidney pain". Today, this continued, was accompanied by NBNB emesis and so he came to the ER.  In the ED, he was afebrile, tachycardic, BP 85-90/50, slightly hypoxic. Na 140,  K 3.8, Cr 2.2 (at baseline), WBC 2.3K, Hgb 8.8, platelets 26K (pancytopenia, all at baseline). UA showed pyuria, hematuria, nitrites and bacteria. BNP was moderately elevated. CXR was clear without edema or focal consolidation. CT renal protocol was without stone or obstruction. Blood cultures were obtained and antibiotics were administered. He did not appear septic. TRH were asked to evaluate for admission.   Hospital Course  82 y.o. male  with a past medical history significant for lymphoproliferative disorder with severe pancytopenia followed by Dr. Benay Spice, diastolic CHF, chronic EF 123456, hypothyroidism, pAF not on warfarin, CKD and aortic stenosis s/p TAVR in 2016 who presents with flank pain, Patient afebrile, but hypotensive, tachycardic and slightly hypoxic, with worsening renal function, admitted for Acute pyelonephritis, patient with known chronic pancytopenia, initially hypotensive despite 1 unit PRBC transfusion for his anemia, initially on peripheral pressors,stable VS, transferred from ICU to medical floor on 5/11  Sepsis on the setting of acute pyelonephritis - Meet sepsis criteria on admission giving hypotension, tachycardia and elevated lactic acid - CT abdomen and pelvis with no stone or obstruction - Recent hospitalization at [place] secondary to sepsis from UTI, will need urology follow up as outpatient given recurrent infection, D/W Dr Alinda Money, patient to follow as outpatient, explained to him and daughter. - Initially on IV cefepime and vancomycin, stopped vancomycin given negative cultures, and culture growing Escherichia coli, sensitive to rcephine, changed to rocephine on 5/11, will discharge on po omnicef 11 days as outpatient for total of 2 weeks treatment. - Hypotension initially, required pressors , currently blood pressure stable.  Acute on chronic diastolic CHF - Patient with evidence of volume overload, giving significant lower extremity edema(at baseline per his  daughter). - Resume home dose Lasix on discharge  Chronic Pancytopenia - Discussed with Dr. Learta Codding, bleeding in the setting of chronic lymphoproliferative disorder, splenomegaly versus liver cirrhosis - Transfuse as needed, received 1 unit PRBC on 5/10 hemoglobin of 7.5, platelet count is low, but no evidence of bleed, no indication for platelet transfusion.   HTN  - Blood pressure initially on the lower side, acceptable on discharge, hold amlodipine, continue with propranolol  Hypothyroidism - Continue levothyroxine.  Depression - Patient denies any suicidal ideation - Continue sertraline.  Severe PCM:  - Continue Ensure  AKI on CKD stage III, baseline Cr 1.9-2.2:  - Creatinine peaked at 2.7, this most likely related to ATN from hypotension , currently improved, back to baseline, creatinine is 1.99 on discharge  PAF: - CHADS2-Vasc 4-5. Not on warfarin because of pancytopenia.  - Continue amiodarone  Right inguinal hernia: - Monitor for now   Discharge Condition:  stable  Follow UP  Follow-up Information    Follow up with Binnie Rail, MD. Schedule an appointment as soon as possible for a visit in 1 week.   Specialty:  Internal Medicine   Contact information:   [place] [address] [phone]       Follow up with Dutch Gray, MD.   Specialty:  Urology   Why:  make appointment in 2 weeks.   Contact information:   [address] [place] [address] [phone]         Discharge Instructions  and  Discharge Medications         Discharge Instructions    Discharge instructions    Complete by:  As directed   Follow with Primary MD Binnie Rail, MD in 7 days   Get CBC, CMP, checked  by Primary MD next visit.    Activity: As tolerated with Full fall precautions use walker/cane & assistance as needed   Disposition Home    Diet: Heart Healthy , with feeding assistance and aspiration precautions.  For Heart failure  patients - Check your Weight same time everyday, if you gain over 2 pounds, or you develop in leg swelling, experience more shortness of breath or chest pain, call your Primary  MD immediately. Follow Cardiac Low Salt Diet and 1.5 lit/day fluid restriction.   On your next visit with your primary care physician please Get Medicines reviewed and adjusted.   Please request your Prim.MD to go over all Hospital Tests and Procedure/Radiological results at the follow up, please get all Hospital records sent to your Prim MD by signing hospital release before you go home.   If you experience worsening of your admission symptoms, develop shortness of breath, life threatening emergency, suicidal or homicidal thoughts you must seek medical attention immediately by calling 911 or calling your MD immediately  if symptoms less severe.  You Must read complete instructions/literature along with all the possible adverse reactions/side effects for all the Medicines you take and that have been prescribed to you. Take any new Medicines after you have completely understood and accpet all the possible adverse reactions/side effects.   Do not drive, operating heavy machinery, perform activities at heights, swimming or participation in water activities or provide baby sitting services if your were admitted for syncope or siezures until you have seen by Primary MD or a Neurologist and advised to do so again.  Do not drive when taking Pain medications.    Do not take more than prescribed Pain, Sleep and Anxiety Medications  Special Instructions: If you have smoked or chewed Tobacco  in the last 2 yrs please stop smoking, stop any regular Alcohol  and or any Recreational drug use.  Wear Seat belts while driving.   Please note  You were cared for by a hospitalist during your hospital stay. If you have any questions about your discharge medications or the care you received while you were in the hospital after you are  discharged, you can call the unit and asked to speak with the hospitalist on call if the hospitalist that took care of you is not available. Once you are discharged, your primary care physician will handle any further medical issues. Please note that NO REFILLS for any discharge medications will be authorized once you are discharged, as it is imperative that you return to your primary care physician (or establish a relationship with a primary care physician if you do not have one) for your aftercare needs so that they can reassess your need for medications and monitor your lab values.     Increase activity slowly    Complete by:  As directed             Medication List    STOP taking these medications        amLODipine 5 MG tablet  Commonly known as:  NORVASC      TAKE these medications        allopurinol 100 MG tablet  Commonly known as:  ZYLOPRIM  TAKE 1 TABLET EVERY DAY     amiodarone 200 MG tablet  Commonly known as:  PACERONE  Take 1 tablet by mouth daily     cefdinir 300 MG capsule  Commonly known as:  OMNICEF  Take 1 capsule (300 mg total) by mouth daily.     clonazePAM 0.5 MG tablet  Commonly known as:  KLONOPIN  TAKE ONE-HALF TABLET BY MOUTH AS NEEDED IF  YOU  WAKE  UP  EARLY     colchicine 0.6 MG tablet  Take 0.6 mg by mouth daily as needed (gout flares).     ferrous sulfate 325 (65 FE) MG tablet  Take 325 mg by mouth 2 (two) times daily with a meal.  fish oil-omega-3 fatty acids 1000 MG capsule  Take 1 g by mouth daily.     Flax Seed Oil 1000 MG Caps  Take 1 capsule by mouth daily.     furosemide 80 MG tablet  Commonly known as:  LASIX  Take 40-80 mg by mouth 2 (two) times daily. Patient taking 80 mg in the morning and 40 mg in the evening     levothyroxine 25 MCG tablet  Commonly known as:  SYNTHROID, LEVOTHROID  Take 25 mcg by mouth daily before breakfast.     metolazone 2.5 MG tablet  Commonly known as:  ZAROXOLYN  Take 1 tablet (2.5 mg total)  by mouth daily as needed (edema).     multivitamin tablet  Take 1 tablet by mouth daily.     mupirocin ointment 2 %  Commonly known as:  BACTROBAN  Place 1 application into the nose 2 (two) times daily.     omeprazole 20 MG capsule  Commonly known as:  PRILOSEC  Take 1 capsule (20 mg total) by mouth daily.     potassium chloride 10 MEQ tablet  Commonly known as:  K-DUR  Take 1 tablet (10 mEq total) by mouth daily.     propranolol 10 MG tablet  Commonly known as:  INDERAL  Take 1 tablet (10 mg total) by mouth 2 (two) times daily.     saccharomyces boulardii 250 MG capsule  Commonly known as:  FLORASTOR  Take 1 capsule (250 mg total) by mouth 2 (two) times daily.     sertraline 50 MG tablet  Commonly known as:  ZOLOFT  TAKE 1 TABLET (50 MG TOTAL) BY MOUTH AT BEDTIME.     traMADol 50 MG tablet  Commonly known as:  ULTRAM  Take 50 mg by mouth every 8 (eight) hours as needed (pain).     vitamin C 500 MG tablet  Commonly known as:  ASCORBIC ACID  Take 1 tablet (500 mg total) by mouth daily.     Vitamin D 1000 units capsule  Take 1 capsule (1,000 Units total) by mouth daily.          Diet and Activity recommendation: See Discharge Instructions above   Consults obtained - ' PCCM   Major procedures and Radiology Reports - PLEASE review detailed and final reports for all details, in brief -      Dg Chest 2 View  05/17/2015  CLINICAL DATA:  82 year old male with shortness of breath and back pain EXAM: CHEST  2 VIEW COMPARISON:  Radiograph dated 05/09/2015 and CT dated 04/12/2014 FINDINGS: Two views of the chest do not demonstrate a focal consolidation. There is no pleural effusion or pneumothorax. Stable cardiac silhouette. Aortic valve replacement. Bilateral hilar calcified granuloma as well as small scattered pulmonary calcified nodules. No acute osseous pathology. Upper abdominal surgical clips. IMPRESSION: No active cardiopulmonary disease. Electronically Signed    By: Anner Crete M.D.   On: 05/17/2015 23:35   Dg Chest 2 View  05/09/2015  CLINICAL DATA:  Cough for 1 week, low-grade fever EXAM: CHEST  2 VIEW COMPARISON:  10/04/2014 FINDINGS: Cardiac shadow is at the upper limits of normal in size but stable. Changes of prior granulomatous disease are noted. No focal infiltrate or sizable effusion is seen. Changes of prior aortic valve replacement are seen. No acute bony abnormality is noted. IMPRESSION: No active cardiopulmonary disease. Electronically Signed   By: Inez Catalina M.D.   On: 05/09/2015 14:17   Ct Renal Stone Study  05/18/2015  CLINICAL DATA:  Right flank pain and vomiting tonight. EXAM: CT ABDOMEN AND PELVIS WITHOUT CONTRAST TECHNIQUE: Multidetector CT imaging of the abdomen and pelvis was performed following the standard protocol without IV contrast. COMPARISON:  None. FINDINGS: There are no urinary calculi. There is no hydronephrosis or ureteral dilatation. Both kidneys are moderately atrophic. There are several cysts bilaterally, not significantly changed from 04/22/2014. There is massive splenomegaly, or 23 cm craniocaudal. This is unchanged. There are unremarkable unenhanced appearances of the liver, bile ducts, pancreas and adrenals. There are multiple upper abdominal varices. There is small volume ascites, unchanged. Bowel is unremarkable. No acute inflammatory changes are evident in the abdomen or pelvis. There is no significant abnormality in the lower chest. There is no significant musculoskeletal lesion. IMPRESSION: Small volume ascites. Massive splenomegaly. Upper abdominal varices. These findings are not significantly changed from 04/22/2014. Kidneys appear atrophic but no evidence of calculi and no evidence of obstructive uropathy. Electronically Signed   By: Andreas Newport M.D.   On: 05/18/2015 01:05    Micro Results    Recent Results (from the past 240 hour(s))  Urine culture     Status: Abnormal   Collection Time: 05/17/15  11:25 PM  Result Value Ref Range Status   Specimen Description URINE, CLEAN CATCH  Final   Special Requests Normal  Final   Culture >=100,000 COLONIES/mL ESCHERICHIA COLI (A)  Final   Report Status 05/20/2015 FINAL  Final   Organism ID, Bacteria ESCHERICHIA COLI (A)  Final      Susceptibility   Escherichia coli - MIC*    AMPICILLIN >=32 RESISTANT Resistant     CEFAZOLIN 8 SENSITIVE Sensitive     CEFTRIAXONE <=1 SENSITIVE Sensitive     CIPROFLOXACIN <=0.25 SENSITIVE Sensitive     GENTAMICIN <=1 SENSITIVE Sensitive     IMIPENEM <=0.25 SENSITIVE Sensitive     NITROFURANTOIN <=16 SENSITIVE Sensitive     TRIMETH/SULFA >=320 RESISTANT Resistant     AMPICILLIN/SULBACTAM >=32 RESISTANT Resistant     PIP/TAZO 8 SENSITIVE Sensitive     * >=100,000 COLONIES/mL ESCHERICHIA COLI  Culture, blood (routine x 2)     Status: None (Preliminary result)   Collection Time: 05/18/15  2:04 AM  Result Value Ref Range Status   Specimen Description BLOOD RIGHT ARM  Final   Special Requests BOTTLES DRAWN AEROBIC AND ANAEROBIC 5ML  Final   Culture NO GROWTH 3 DAYS  Final   Report Status PENDING  Incomplete  Culture, blood (routine x 2)     Status: None (Preliminary result)   Collection Time: 05/18/15  2:09 AM  Result Value Ref Range Status   Specimen Description BLOOD RIGHT HAND  Final   Special Requests BOTTLES DRAWN AEROBIC AND ANAEROBIC 5ML  Final   Culture NO GROWTH 3 DAYS  Final   Report Status PENDING  Incomplete  MRSA PCR Screening     Status: None   Collection Time: 05/18/15  3:12 PM  Result Value Ref Range Status   MRSA by PCR NEGATIVE NEGATIVE Final    Comment:        The GeneXpert MRSA Assay (FDA approved for NASAL specimens only), is one component of a comprehensive MRSA colonization surveillance program. It is not intended to diagnose MRSA infection nor to guide or monitor treatment for MRSA infections.        Today   Subjective:   [name] today has no headache,no chest  abdominal pain,no new weakness tingling or numbness, feels much better  wants to go home today.   Objective:   Blood pressure 104/71, pulse 109, temperature 97.5 F (36.4 C), temperature source Oral, resp. rate 18, height 5\' 10"  (1.778 m), weight 81.8 kg (180 lb 5.4 oz), SpO2 98 %.   Intake/Output Summary (Last 24 hours) at 05/21/15 1154 Last data filed at 05/21/15 0552  Gross per 24 hour  Intake    180 ml  Output    200 ml  Net    -20 ml    Exam Awake Alert, Oriented X 3, Supple Neck,No JVD,  Symmetrical Chest wall movement, Good air movement bilaterally, No wheezing  No Gallops,Rubs or new Murmurs, No Parasternal Heave +ve B.Sounds, Abd Soft,significant right CVA tenderness, No rebound - guarding or rigidity. No Cyanosis,, chronic bruising, +2 edema bilaterally  Data Review   CBC w Diff:  Lab Results  Component Value Date   WBC 1.5* 05/21/2015   WBC 3.5* 05/16/2015   HGB 9.0* 05/21/2015   HGB 9.7* 05/16/2015   HCT 28.5* 05/21/2015   HCT 30.0* 05/16/2015   PLT 26* 05/21/2015   PLT 30* 05/16/2015   LYMPHOPCT 16 05/19/2015   LYMPHOPCT 13.9* 05/16/2015   MONOPCT 12 05/19/2015   MONOPCT 5.8 05/16/2015   EOSPCT 1 05/19/2015   EOSPCT 1.2 05/16/2015   BASOPCT 1 05/19/2015   BASOPCT 0.3 05/16/2015    CMP:  Lab Results  Component Value Date   NA 142 05/21/2015   NA 145 12/06/2014   K 4.1 05/21/2015   K 4.2 12/06/2014   CL 107 05/21/2015   CO2 22 05/21/2015   CO2 25 12/06/2014   BUN 60* 05/21/2015   BUN 47.1* 12/06/2014   CREATININE 1.99* 05/21/2015   CREATININE 2.2* 12/06/2014   CREATININE 2.52* 04/26/2014   PROT 4.6* 05/18/2015   PROT 6.5 12/06/2014   ALBUMIN 2.6* 05/18/2015   ALBUMIN 3.4* 12/06/2014   BILITOT 1.3* 05/18/2015   BILITOT 0.66 12/06/2014   ALKPHOS 85 05/18/2015   ALKPHOS 87 12/06/2014   AST 29 05/18/2015   AST 27 12/06/2014   ALT 25 05/18/2015   ALT 21 12/06/2014  .   Total Time in preparing paper work, data evaluation and todays  exam - 35 minutes  ELGERGAWY, DAWOOD M.D on 05/21/2015 at 11:54 AM  [facility]   Office  [phone]

## 2015-05-21 NOTE — Progress Notes (Signed)
Nsg Discharge Note  Admit Date:  05/17/2015 Discharge date: 05/21/2015   [name] to be D/C'd Home per MD order.  AVS completed.  Copy for chart, and copy for patient signed, and dated. Patient/caregiver able to verbalize understanding.  Discharge Medication:   Medication List    STOP taking these medications        amLODipine 5 MG tablet  Commonly known as:  NORVASC      TAKE these medications        allopurinol 100 MG tablet  Commonly known as:  ZYLOPRIM  TAKE 1 TABLET EVERY DAY     amiodarone 200 MG tablet  Commonly known as:  PACERONE  Take 1 tablet by mouth daily     cefdinir 300 MG capsule  Commonly known as:  OMNICEF  Take 1 capsule (300 mg total) by mouth daily.     clonazePAM 0.5 MG tablet  Commonly known as:  KLONOPIN  TAKE ONE-HALF TABLET BY MOUTH AS NEEDED IF  YOU  WAKE  UP  EARLY     colchicine 0.6 MG tablet  Take 0.6 mg by mouth daily as needed (gout flares).     ferrous sulfate 325 (65 FE) MG tablet  Take 325 mg by mouth 2 (two) times daily with a meal.     fish oil-omega-3 fatty acids 1000 MG capsule  Take 1 g by mouth daily.     Flax Seed Oil 1000 MG Caps  Take 1 capsule by mouth daily.     furosemide 80 MG tablet  Commonly known as:  LASIX  Take 40-80 mg by mouth 2 (two) times daily. Patient taking 80 mg in the morning and 40 mg in the evening     levothyroxine 25 MCG tablet  Commonly known as:  SYNTHROID, LEVOTHROID  Take 25 mcg by mouth daily before breakfast.     metolazone 2.5 MG tablet  Commonly known as:  ZAROXOLYN  Take 1 tablet (2.5 mg total) by mouth daily as needed (edema).     multivitamin tablet  Take 1 tablet by mouth daily.     mupirocin ointment 2 %  Commonly known as:  BACTROBAN  Place 1 application into the nose 2 (two) times daily.     omeprazole 20 MG capsule  Commonly known as:  PRILOSEC  Take 1 capsule (20 mg total) by mouth daily.     potassium chloride 10 MEQ tablet  Commonly known as:  K-DUR  Take 1  tablet (10 mEq total) by mouth daily.     propranolol 10 MG tablet  Commonly known as:  INDERAL  Take 1 tablet (10 mg total) by mouth 2 (two) times daily.     saccharomyces boulardii 250 MG capsule  Commonly known as:  FLORASTOR  Take 1 capsule (250 mg total) by mouth 2 (two) times daily.     sertraline 50 MG tablet  Commonly known as:  ZOLOFT  TAKE 1 TABLET (50 MG TOTAL) BY MOUTH AT BEDTIME.     traMADol 50 MG tablet  Commonly known as:  ULTRAM  Take 50 mg by mouth every 8 (eight) hours as needed (pain).     vitamin C 500 MG tablet  Commonly known as:  ASCORBIC ACID  Take 1 tablet (500 mg total) by mouth daily.     Vitamin D 1000 units capsule  Take 1 capsule (1,000 Units total) by mouth daily.        Discharge Assessment: Filed Vitals:   05/20/15  2317 05/21/15 0539  BP: 125/76 104/71  Pulse: 102 109  Temp: 97.8 F (36.6 C) 97.5 F (36.4 C)  Resp: 17 18   Skin clean, dry and intact without evidence of skin break down, no evidence of skin tears noted. IV catheter discontinued intact. Site without signs and symptoms of complications - no redness or edema noted at insertion site, patient denies c/o pain - only slight tenderness at site.  Dressing with slight pressure applied.  D/c Instructions-Education: Discharge instructions given to patient/family with verbalized understanding. D/c education completed with patient/family including follow up instructions, medication list, d/c activities limitations if indicated, with other d/c instructions as indicated by MD - patient able to verbalize understanding, all questions fully answered. Patient instructed to return to ED, call 911, or call MD for any changes in condition.  Patient escorted via [place], and D/C home via private auto.  Salley Slaughter, RN 05/21/2015 1:51 PM

## 2015-05-21 NOTE — Discharge Instructions (Signed)
Follow with Primary MD Stacy J Burns, MD in 7 days  ° °Get CBC, CMP,checked  by Primary MD next visit.  ° ° °Activity: As tolerated with Full fall precautions use walker/cane & assistance as needed ° ° °Disposition Home  ° ° °Diet: Heart Healthy  , with feeding assistance and aspiration precautions. ° °For Heart failure patients - Check your Weight same time everyday, if you gain over 2 pounds, or you develop in leg swelling, experience more shortness of breath or chest pain, call your Primary MD immediately. Follow Cardiac Low Salt Diet and 1.5 lit/day fluid restriction. ° ° °On your next visit with your primary care physician please Get Medicines reviewed and adjusted. ° ° °Please request your Prim.MD to go over all Hospital Tests and Procedure/Radiological results at the follow up, please get all Hospital records sent to your Prim MD by signing hospital release before you go home. ° ° °If you experience worsening of your admission symptoms, develop shortness of breath, life threatening emergency, suicidal or homicidal thoughts you must seek medical attention immediately by calling 911 or calling your MD immediately  if symptoms less severe. ° °You Must read complete instructions/literature along with all the possible adverse reactions/side effects for all the Medicines you take and that have been prescribed to you. Take any new Medicines after you have completely understood and accpet all the possible adverse reactions/side effects.  ° °Do not drive, operating heavy machinery, perform activities at heights, swimming or participation in water activities or provide baby sitting services if your were admitted for syncope or siezures until you have seen by Primary MD or a Neurologist and advised to do so again. ° °Do not drive when taking Pain medications.  ° ° °Do not take more than prescribed Pain, Sleep and Anxiety Medications ° °Special Instructions: If you have smoked or chewed Tobacco  in the last 2 yrs please  stop smoking, stop any regular Alcohol  and or any Recreational drug use. ° °Wear Seat belts while driving. ° ° °Please note ° °You were cared for by a hospitalist during your hospital stay. If you have any questions about your discharge medications or the care you received while you were in the hospital after you are discharged, you can call the unit and asked to speak with the hospitalist on call if the hospitalist that took care of you is not available. Once you are discharged, your primary care physician will handle any further medical issues. Please note that NO REFILLS for any discharge medications will be authorized once you are discharged, as it is imperative that you return to your primary care physician (or establish a relationship with a primary care physician if you do not have one) for your aftercare needs so that they can reassess your need for medications and monitor your lab values. ° °

## 2015-05-23 ENCOUNTER — Telehealth: Payer: Self-pay | Admitting: Internal Medicine

## 2015-05-23 ENCOUNTER — Telehealth: Payer: Self-pay | Admitting: *Deleted

## 2015-05-23 DIAGNOSIS — N390 Urinary tract infection, site not specified: Secondary | ICD-10-CM

## 2015-05-23 NOTE — Telephone Encounter (Signed)
Pt was in hospital and he has appt with Marya Amsler next week and the discharge instructions wanted him to see urologist Dr. Alinda Money in 2 weeks.  Are you able to put a referral in? Please advise

## 2015-05-23 NOTE — Telephone Encounter (Signed)
Transition Care Management Follow-up Telephone Call   Date discharged? 05/21/15   How have you been since you were released from the hospital? Spoke w/daughter she states he is doing ok   Do you understand why you were in the hospital? YES   Do you understand the discharge instructions? YES   Where were you discharged to? Home   Items Reviewed:  Medications reviewed: YES  Allergies reviewed: YES  Dietary changes reviewed: YES  Referrals reviewed: No referral needed   Functional Questionnaire:   Activities of Daily Living (ADLs):   She states he are independent in the following: bathing and hygiene, feeding, continence, grooming, toileting and dressing States he require assistance with the following: ambulation   Any transportation issues/concerns?: NO   Any patient concerns? NO   Confirmed importance and date/time of follow-up visits scheduled {YES, appt 05/30/15  Provider Appointment booked with Terri Piedra, NP due to no availability w/Dr. Quay Burow  Confirmed with patient if condition begins to worsen call PCP or go to the ER.  Patient was given the office number and encouraged to call back with question or concerns.  : YES

## 2015-05-23 NOTE — Telephone Encounter (Signed)
ordered

## 2015-05-24 LAB — CULTURE, BLOOD (ROUTINE X 2)
CULTURE: NO GROWTH
CULTURE: NO GROWTH

## 2015-05-30 ENCOUNTER — Ambulatory Visit (HOSPITAL_COMMUNITY): Payer: Commercial Managed Care - HMO | Attending: Cardiology

## 2015-05-30 ENCOUNTER — Encounter: Payer: Self-pay | Admitting: Family

## 2015-05-30 ENCOUNTER — Ambulatory Visit (INDEPENDENT_AMBULATORY_CARE_PROVIDER_SITE_OTHER): Payer: Commercial Managed Care - HMO | Admitting: Family

## 2015-05-30 ENCOUNTER — Other Ambulatory Visit: Payer: Self-pay

## 2015-05-30 ENCOUNTER — Ambulatory Visit (INDEPENDENT_AMBULATORY_CARE_PROVIDER_SITE_OTHER): Payer: Commercial Managed Care - HMO | Admitting: Cardiovascular Disease

## 2015-05-30 ENCOUNTER — Encounter: Payer: Self-pay | Admitting: Cardiovascular Disease

## 2015-05-30 VITALS — BP 110/60 | HR 76 | Ht 70.0 in | Wt 177.8 lb

## 2015-05-30 VITALS — BP 128/62 | HR 86 | Temp 97.8°F | Wt 177.0 lb

## 2015-05-30 DIAGNOSIS — Z8673 Personal history of transient ischemic attack (TIA), and cerebral infarction without residual deficits: Secondary | ICD-10-CM | POA: Diagnosis not present

## 2015-05-30 DIAGNOSIS — Z8249 Family history of ischemic heart disease and other diseases of the circulatory system: Secondary | ICD-10-CM | POA: Diagnosis not present

## 2015-05-30 DIAGNOSIS — I509 Heart failure, unspecified: Secondary | ICD-10-CM | POA: Diagnosis not present

## 2015-05-30 DIAGNOSIS — I4891 Unspecified atrial fibrillation: Secondary | ICD-10-CM | POA: Diagnosis not present

## 2015-05-30 DIAGNOSIS — Z87891 Personal history of nicotine dependence: Secondary | ICD-10-CM | POA: Insufficient documentation

## 2015-05-30 DIAGNOSIS — I5033 Acute on chronic diastolic (congestive) heart failure: Secondary | ICD-10-CM | POA: Diagnosis not present

## 2015-05-30 DIAGNOSIS — I071 Rheumatic tricuspid insufficiency: Secondary | ICD-10-CM | POA: Insufficient documentation

## 2015-05-30 DIAGNOSIS — N189 Chronic kidney disease, unspecified: Secondary | ICD-10-CM | POA: Insufficient documentation

## 2015-05-30 DIAGNOSIS — N390 Urinary tract infection, site not specified: Secondary | ICD-10-CM

## 2015-05-30 DIAGNOSIS — I350 Nonrheumatic aortic (valve) stenosis: Secondary | ICD-10-CM | POA: Diagnosis not present

## 2015-05-30 DIAGNOSIS — Z952 Presence of prosthetic heart valve: Secondary | ICD-10-CM

## 2015-05-30 DIAGNOSIS — Z953 Presence of xenogenic heart valve: Secondary | ICD-10-CM | POA: Diagnosis not present

## 2015-05-30 DIAGNOSIS — E1122 Type 2 diabetes mellitus with diabetic chronic kidney disease: Secondary | ICD-10-CM | POA: Insufficient documentation

## 2015-05-30 DIAGNOSIS — Z954 Presence of other heart-valve replacement: Secondary | ICD-10-CM

## 2015-05-30 DIAGNOSIS — Z5189 Encounter for other specified aftercare: Secondary | ICD-10-CM

## 2015-05-30 DIAGNOSIS — E785 Hyperlipidemia, unspecified: Secondary | ICD-10-CM | POA: Diagnosis not present

## 2015-05-30 MED ORDER — FUROSEMIDE 80 MG PO TABS: 80.0000 mg | ORAL_TABLET | Freq: Two times a day (BID) | ORAL | Status: DC

## 2015-05-30 NOTE — Patient Instructions (Signed)
Medication Instructions:  Your physician has recommended you make the following change in your medication:  1. INCREASE Furosemide to 80mg  one tablet by mouth twice a day 2. Please take a dosage of Metolazone 30 minutes prior to your morning dosage of Furosemide on Tuesday   Labwork: Your physician recommends that you return for lab work in: 2 WEEKS (BMP)  Testing/Procedures: No new orders.   Follow-Up: Your physician recommends that you schedule a follow-up appointment in: 3 MONTHS with Dr Burt Knack   Any Other Special Instructions Will Be Listed Below (If Applicable).  Your physician discussed the importance of taking an antibiotic prior to any dental, gastrointestinal, genitourinary procedures to prevent damage to the heart valves from infection.    If you need a refill on your cardiac medications before your next appointment, please call your pharmacy.

## 2015-05-30 NOTE — Patient Instructions (Signed)
Thank you for choosing [facility].  Summary/Instructions:  Please continue to take your medications as prescribed.   Complete the course of Omnicef.  Continue with your follow up with Urology.  If your symptoms worsen or fail to improve, please contact our office for further instruction, or in case of emergency go directly to the emergency room at the closest medical facility.

## 2015-05-30 NOTE — Assessment & Plan Note (Signed)
Exam today remains consistent with hypervolemia. Furosemide recently increased during cardiology appointment prior to this visit. Denies chest pain or shortness of breath. Able to complete activities of daily living consistent with NYHA class II. Continue current dosage of furosemide, metolazone, potassium, and propranolol. Continue to monitor weight at home. Follow-up with cardiology and primary care as indicated.

## 2015-05-30 NOTE — Progress Notes (Signed)
Subjective:    Patient ID: [name], male    DOB: 03-21-1922, 82 y.o.   MRN: [id number]  Chief Complaint  Patient presents with  . Congestive Heart Failure  . Urinary Tract Infection    HPI:  [name] is a 82 y.o. male who  has a past medical history of Stroke ([place]); PAF (paroxysmal atrial fibrillation) ([place]); Bradycardia; Aortic stenosis; Hypertension; Pancytopenia ([place]); Action tremor; Thrombocytopenia ([place]); Colon polyp; Anxiety; Splenomegaly; Gout; GERD (gastroesophageal reflux disease); Hypothyroidism; Leukopenia; Degenerative disc disease; Joint effusion, knee; Synovial cyst of popliteal space; Cellulitis of left leg (10/11-16/2012); CKD (chronic kidney disease), stage III; Chronic diastolic CHF (congestive heart failure) ([place]); Bacteremia; High cholesterol; Diabetes mellitus; History of blood transfusion (01/2011; 11/2012); H/O hiatal hernia; Throat cancer ([place]); Diverticulosis; S/P TAVR (transcatheter aortic valve replacement); QT prolongation; and Hypotension. and presents today for an office follow up.  Recently evaluated in the emergency department and admitted to the hospital complaining of sudden onset constant, right flank pain. Pain was mildly relieved after vomiting although he mentioned having dysuria and frequency which she attributed to the Lasix he was taking. Notes he was recently discharged from [place] with a diagnosis of pyelonephritis and admitted for 7 days. He has been noted to have increased leg swelling with no relief from Lasix. Physical exam showed frail, chronically ill-appearing elderly male awake and comfortable. He was noted dry mucous membranes and 3+ pitting edema to bilateral lower extremities. His vital signs are stable with no fevers. Family notes his blood pressure is normally low. Chest x-ray was negative for acute processes and labs show UA consistent with infection and elevated BNP. CBC showed white blood cell count 2.3 and hemoglobin 8.8.  Noncontrast CT shows chronic findings suggestive of liver failure unchanged from previous with no renal stones. He was diagnosed with sepsis in the setting of acute pyelonephritis given the hypotension, tachycardia and elevated lactic acid levels. Advised urology follow-up as outpatient given recurrent infections. He was initially placed on IV cefepime and vancomycin sensitive to Rocephin. He was discharged on Omnicef for 11 days with outpatient completion of 2 weeks of antibiotics. Acute on chronic diastolic heart failure and volume overload which he continued his home dose of Lasix. Chronic pancytopenia was discussed with Dr. Benay Spice, bleeding in the setting of chronic lymphoma proliferative disorder, splenomegaly versus liver cirrhosis. He was transfused 1 unit of packed red blood cells on 5/10. Hospitalist follow-up recommended CBC and CMP and outpatient follow-up with urology. All hospital records, labs, and imaging reviewed in detail.  Since leaving the hospital he reports that he continues to feel some weakness but that he is getting better. Continues to experience lower extremity edema with reports that his breathing is doing okay. Reports taking the [facility] as prescribed and denies adverse side effects with about 3 more days remaining in the course. Denies any fevers, urinary frequency, urgency or flank pain. Able to work towards completion of his activities of daily living. Seen by cardiology this morning and his Lasix was increased to 80 mg twice daily and the metolazone no more than 2 times per week. Denies chest pain or shortness of breath currently.  Allergies  Allergen Reactions  . Penicillins Other (See Comments)    Does not remember reaction (~year 1950)  . Neomycin-Bacitracin Zn-Polymyx Other (See Comments)    ? Reaction (thinks he remembers redness)     Current Outpatient Prescriptions on File Prior to Visit  Medication Sig Dispense Refill  . allopurinol (ZYLOPRIM)  100 MG tablet  Take 100 mg by mouth daily.    [name] Kitchen amiodarone (PACERONE) 200 MG tablet Take 1 tablet by mouth daily 90 tablet 0  . cefdinir (OMNICEF) 300 MG capsule Take 1 capsule (300 mg total) by mouth daily. 11 capsule 0  . Cholecalciferol (VITAMIN D) 1000 UNITS capsule Take 1 capsule (1,000 Units total) by mouth daily. 90 capsule 3  . clonazePAM (KLONOPIN) 0.5 MG tablet Take 0.25 mg by mouth daily as needed (Take 1/2 tablet once daily as needed if you wake up early).    . colchicine 0.6 MG tablet Take 0.6 mg by mouth daily as needed (gout flares).     . ferrous sulfate 325 (65 FE) MG tablet Take 325 mg by mouth 2 (two) times daily with a meal.    . fish oil-omega-3 fatty acids 1000 MG capsule Take 1 g by mouth daily.      . Flaxseed, Linseed, (FLAX SEED OIL) 1000 MG CAPS Take 1 capsule by mouth daily.     . furosemide (LASIX) 80 MG tablet Take 1 tablet (80 mg total) by mouth 2 (two) times daily. 60 tablet 6  . levothyroxine (SYNTHROID, LEVOTHROID) 25 MCG tablet Take 25 mcg by mouth daily before breakfast.    . metolazone (ZAROXOLYN) 2.5 MG tablet Take 1 tablet (2.5 mg total) by mouth daily as needed (edema). 30 tablet 3  . Multiple Vitamin (MULTIVITAMIN) tablet Take 1 tablet by mouth daily.      . mupirocin ointment (BACTROBAN) 2 % Place 1 application into the nose 2 (two) times daily. 22 g 0  . omeprazole (PRILOSEC) 20 MG capsule Take 1 capsule (20 mg total) by mouth daily. 90 capsule 1  . potassium chloride (K-DUR) 10 MEQ tablet Take 1 tablet (10 mEq total) by mouth daily. 90 tablet 1  . propranolol (INDERAL) 10 MG tablet Take 1 tablet (10 mg total) by mouth 2 (two) times daily. 180 tablet 3  . saccharomyces boulardii (FLORASTOR) 250 MG capsule Take 1 capsule (250 mg total) by mouth 2 (two) times daily. 30 capsule 0  . sertraline (ZOLOFT) 50 MG tablet TAKE 1 TABLET (50 MG TOTAL) BY MOUTH AT BEDTIME. 90 tablet 0  . traMADol (ULTRAM) 50 MG tablet Take 50 mg by mouth every 8 (eight) hours as needed (pain).    .  vitamin C (ASCORBIC ACID) 500 MG tablet Take 1 tablet (500 mg total) by mouth daily. 90 tablet 3   No current facility-administered medications on file prior to visit.     Past Surgical History  Procedure Laterality Date  . Cholecystectomy    . Joint effusion      left knee  . Aortic valve replacement  01/23/2011    via minimally invasive approach per Dr Evelina Dun, [facility]  . Tee without cardioversion N/A 12/15/2012    Procedure: TRANSESOPHAGEAL ECHOCARDIOGRAM (TEE);  Surgeon: [name], MD;  Location: [facility] ENDOSCOPY;  Service: Cardiovascular;  Laterality: N/A;  . Cardiac valve replacement    . Inguinal hernia repair Right   . Excisional hemorrhoidectomy    . Cardiac catheterization    . Surgery scrotal / testicular      "removed one" (01/05/2013)  . 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 Tee without cardioversion N/A 03/26/2014    Procedure: TRANSESOPHAGEAL ECHOCARDIOGRAM (TEE);  Surgeon:  Josue Hector, MD;  Location: [facility] ENDOSCOPY;  Service: Cardiovascular;  Laterality: N/A;  . Transcatheter aortic valve replacement, transfemoral Right 05/14/2014    Procedure: TRANSCATHETER AORTIC VALVE REPLACEMENT, TRANSFEMORAL;  Surgeon: [name], MD;  Location: [place];  Service: Open Heart Surgery;  Laterality: Right;  . Tee without cardioversion N/A 05/14/2014    Procedure: TRANSESOPHAGEAL ECHOCARDIOGRAM (TEE);  Surgeon: [name], MD;  Location: [place];  Service: Open Heart Surgery;  Laterality: N/A;    Review of Systems  Constitutional: Negative for fever and chills.  Eyes:       Negative for changes in vision  Respiratory: Negative for cough, chest tightness, shortness of breath and wheezing.   Cardiovascular: Negative for chest pain, palpitations and leg swelling.  Neurological: Negative for dizziness, weakness and light-headedness.       Objective:    BP 128/62 mmHg  Pulse 86  Temp(Src) 97.8 F (36.6 C)  Wt 177 lb (80.287 kg)  SpO2 99% Nursing note and vital signs reviewed.  Physical Exam  Constitutional: He is oriented to person, place, and time. He appears well-developed and well-nourished. No distress.  Cardiovascular: Normal rate, regular rhythm and intact distal pulses.   Murmur heard. Pulmonary/Chest: Effort normal and breath sounds normal.  Neurological: He is alert and oriented to person, place, and time.  Skin: Skin is warm and dry.  Psychiatric: He has a normal mood and affect. His behavior is normal. Judgment and thought content normal.       Assessment & Plan:   Problem List Items Addressed This Visit      Cardiovascular and Mediastinum   Acute on chronic diastolic CHF (congestive heart failure) ([place])    Exam today remains consistent with hypervolemia. Furosemide recently increased during cardiology appointment prior to this visit. Denies chest pain or shortness of breath. Able to complete activities of daily living consistent with NYHA class II. Continue current dosage of furosemide, metolazone, potassium, and propranolol. Continue to monitor weight at home. Follow-up with cardiology and primary care as indicated.        Genitourinary   UTI (lower urinary tract infection) - Primary    Continues to complete current dosage of Omnicef with 2-3 days remaining for completion of course. Denies fevers or other urinary symptoms. Symptoms of systemic inflammatory response syndrome appear resolved with vital signs being stable. Follow-up with urology as scheduled.          I am having Mr. Friedlander maintain his fish oil-omega-3 fatty acids, Flax Seed Oil, multivitamin, vitamin C, Vitamin D, colchicine, potassium chloride, ferrous sulfate, levothyroxine, traMADol, metolazone, propranolol, omeprazole, amiodarone, sertraline, mupirocin ointment, saccharomyces boulardii, cefdinir, allopurinol, clonazePAM, and  furosemide.    Follow-up: Return if symptoms worsen or fail to improve.  Mauricio Po, FNP

## 2015-05-30 NOTE — Progress Notes (Signed)
Cardiology Office Note Date:  05/30/2015   ID:  [name], DOB October 16, 1922, MRN [id number]  PCP:  Binnie Rail, MD  Cardiologist:  [name], MD    Chief Complaint  Patient presents with  . Coronary Artery Disease    native vessel. C/O some chest discomfort at night when lying on left side but goes away.  . Atrial Fibrillation    Has some leg swelling. No cp or claudication  . Aortic Stenosis    severe     History of Present Illness: [name] is a 82 y.o. male who presents for Follow-up evaluation. He has a history of aortic valve disease (s/p AVR 01/2011 with severe bioprosthetic valve dysfunction leading to valve-in-valve TAVR 05/2014), PAF, chronic pancytopenia/splenomegaly (followed by Dr. Benay Spice), possible chronic lymphoproliferative disorder or splenic lymphoma,stroke, CKD stage III, chronic diastolic CHF, hypertension, diet-controlled diabetes, GERD, hypothyroidism, gout, and depression. His wife passed away earlier this year. They were married 83 years.   He Has been hospitalized twice with urosepsis. During his first hospitalization in [place], his weight was up about 20-25 pounds from baseline after fluid resuscitation. He continues to complain of leg swelling, but his weight has improved to near his baseline. He has some chest pain when he first lies down at night, especially when he is on his left side. He's had no other chest pain or pressure. He denies orthopnea or PND. No lightheadedness or syncope. He has about 3 days of oral antibiotics left to complete his treatment course.   Past Medical History  Diagnosis Date  . Stroke ([place])   . PAF (paroxysmal atrial fibrillation) ([facility])     a. amiodarone therapy; not felt to be a candidate for anticoagulation with pancytopenia. b. Back in AF 09/2014 - decision was made to pursue rate control only.  . Bradycardia   . Aortic stenosis     a. Previously severe -> s/p minimally invasive tissue AVR with Dr. Evelina Dun at  [facility] 01/2011 (pre-AVR cath with no obs CAD);  b. 05/2014 s/p TAVR (23 mm Edwards Sapien 3).  . Hypertension   . Pancytopenia ([place])     a. possible chronic lymphoproliferative disorder or splenic lymphoma.  . Action tremor   . Thrombocytopenia ([place])     Dr. Benay Spice  . Colon polyp   . Anxiety   . Splenomegaly   . Gout   . GERD (gastroesophageal reflux disease)   . Hypothyroidism   . Leukopenia     Chronic pancytopenia  . Degenerative disc disease   . Joint effusion, knee     left knee  . Synovial cyst of popliteal space   . Cellulitis of left leg 10/11-16/2012  . CKD (chronic kidney disease), stage III   . Chronic diastolic CHF (congestive heart failure) ([place])     a. 12/15/2012 TEE: EF 60-65%, no veg.  . Bacteremia     a. 12/2012 - S bovis;  b. 12/2012 TEE w/o veg;  c. Seeing ID->Rocephin therapy extended to 01/25/2013 via PICC for possible endocarditis (No veg on TEE).  . High cholesterol   . Diabetes mellitus     "borderline" (01/05/2013)  . History of blood transfusion 01/2011; 11/2012  . H/O hiatal hernia   . Throat cancer ([place])     s/p lasered  . Diverticulosis   . S/P TAVR (transcatheter aortic valve replacement)     a. 05/14/2014 TAVR: 23 mm Edwards Sapien 3 transcatheter heart valve placed valve-in-valve for prosthetic valve dysfunction via  open right transfemoral approach  . QT prolongation   . Hypotension     Past Surgical History  Procedure Laterality Date  . Cholecystectomy    . Joint effusion      left knee  . Aortic valve replacement  01/23/2011    via minimally invasive approach per Dr Evelina Dun, [facility]  . Tee without cardioversion N/A 12/15/2012    Procedure: TRANSESOPHAGEAL ECHOCARDIOGRAM (TEE);  Surgeon: [name], MD;  Location: [facility] ENDOSCOPY;  Service: Cardiovascular;  Laterality: N/A;  . Cardiac valve replacement    . Inguinal hernia repair Right   . Excisional hemorrhoidectomy    . Cardiac catheterization    . Surgery scrotal / testicular       "removed one" (01/05/2013)  . 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 Tee without cardioversion N/A 03/26/2014    Procedure: TRANSESOPHAGEAL ECHOCARDIOGRAM (TEE);  Surgeon: Josue Hector, MD;  Location: [facility] ENDOSCOPY;  Service: Cardiovascular;  Laterality: N/A;  . Transcatheter aortic valve replacement, transfemoral Right 05/14/2014    Procedure: TRANSCATHETER AORTIC VALVE REPLACEMENT, TRANSFEMORAL;  Surgeon: [name], MD;  Location: [place];  Service: Open Heart Surgery;  Laterality: Right;  . Tee without cardioversion N/A 05/14/2014    Procedure: TRANSESOPHAGEAL ECHOCARDIOGRAM (TEE);  Surgeon: [name], MD;  Location: [place];  Service: Open Heart Surgery;  Laterality: N/A;    Current Outpatient Prescriptions  Medication Sig Dispense Refill  . allopurinol (ZYLOPRIM) 100 MG tablet Take 100 mg by mouth daily.    [name] Kitchen amiodarone (PACERONE) 200 MG tablet Take 1 tablet by mouth daily 90 tablet 0  . cefdinir (OMNICEF) 300 MG capsule Take 1 capsule (300 mg total) by mouth daily. 11 capsule 0  . Cholecalciferol (VITAMIN D) 1000 UNITS capsule Take 1 capsule (1,000 Units total) by mouth daily. 90 capsule 3  . clonazePAM (KLONOPIN) 0.5 MG tablet Take 0.25 mg by mouth daily as needed (Take 1/2 tablet once daily as needed if you wake up early).    . colchicine 0.6 MG tablet Take 0.6 mg by mouth daily as needed (gout flares).     . ferrous sulfate 325 (65 FE) MG tablet Take 325 mg by mouth 2 (two) times daily with a meal.    . fish oil-omega-3 fatty acids 1000 MG capsule Take 1 g by mouth daily.      . Flaxseed, Linseed, (FLAX SEED OIL) 1000 MG CAPS Take 1 capsule by mouth daily.     . furosemide (LASIX) 80 MG tablet Take 1 tablet (80 mg total) by mouth 2 (two) times daily. 60 tablet 6  . levothyroxine (SYNTHROID, LEVOTHROID) 25 MCG tablet Take 25 mcg by mouth  daily before breakfast.    . metolazone (ZAROXOLYN) 2.5 MG tablet Take 1 tablet (2.5 mg total) by mouth daily as needed (edema). 30 tablet 3  . Multiple Vitamin (MULTIVITAMIN) tablet Take 1 tablet by mouth daily.      . mupirocin ointment (BACTROBAN) 2 % Place 1 application into the nose 2 (two) times daily. 22 g 0  . omeprazole (PRILOSEC) 20 MG capsule Take 1 capsule (20 mg total) by mouth daily. 90 capsule 1  . potassium chloride (K-DUR) 10 MEQ tablet Take 1 tablet (10 mEq total) by mouth daily. 90 tablet 1  . propranolol (INDERAL) 10 MG tablet Take 1 tablet (10  mg total) by mouth 2 (two) times daily. 180 tablet 3  . saccharomyces boulardii (FLORASTOR) 250 MG capsule Take 1 capsule (250 mg total) by mouth 2 (two) times daily. 30 capsule 0  . sertraline (ZOLOFT) 50 MG tablet TAKE 1 TABLET (50 MG TOTAL) BY MOUTH AT BEDTIME. 90 tablet 0  . traMADol (ULTRAM) 50 MG tablet Take 50 mg by mouth every 8 (eight) hours as needed (pain).    . vitamin C (ASCORBIC ACID) 500 MG tablet Take 1 tablet (500 mg total) by mouth daily. 90 tablet 3   No current facility-administered medications for this visit.    Allergies:   Penicillins and Neomycin-bacitracin zn-polymyx   Social History:  The patient  reports that he quit smoking about 53 years ago. His smoking use included Cigarettes. He has a 18 pack-year smoking history. He has never used smokeless tobacco. He reports that he does not drink alcohol or use illicit drugs.   Family History:  [name]  family history includes Cancer in his brother; Colon cancer in his other; Heart disease in his father; Lung cancer in his mother; Stroke in his other.    ROS:  Please see the history of present illness. All other systems are reviewed and negative.    PHYSICAL EXAM: VS:  BP 110/60 mmHg  Pulse 76  Ht 5\' 10"  (1.778 m)  Wt 177 lb 12.8 oz (80.65 kg)  BMI 25.51 kg/m2 , BMI Body mass index is 25.51 kg/(m^2). GEN: Well nourished, well developed, pleasant  elderly male in no acute distress HEENT: normal Neck: JVP elevated with positive HJR, no masses. No carotid bruits Cardiac: irregular with 2/6 systolic murmur at the LSB          Respiratory:  clear to auscultation bilaterally, normal work of breathing GI: soft, nontender, nondistended, + BS MS: no deformity or atrophy Ext: 2+ pretibial edema, pedal pulses 2+= bilaterally Skin: warm and dry, no rash Neuro:  Strength and sensation are intact Psych: euthymic mood, full affect  EKG:  EKG is not ordered today.  Recent Labs: 10/04/2014: Magnesium 1.9 05/09/2015: Pro B Natriuretic peptide (BNP) 410.0* 05/17/2015: B Natriuretic Peptide 674.3* 05/18/2015: ALT 25; TSH 2.664 05/21/2015: BUN 60*; Creatinine, Ser 1.99*; Hemoglobin 9.0*; Platelets 26*; Potassium 4.1; Sodium 142   Lipid Panel     Component Value Date/Time   CHOL 84 09/21/2013 0809   TRIG 94.0 09/21/2013 0809   HDL 25.60* 09/21/2013 0809   CHOLHDL 3 09/21/2013 0809   VLDL 18.8 09/21/2013 0809   LDLCALC 40 09/21/2013 0809      Wt Readings from Last 3 Encounters:  05/30/15 177 lb 12.8 oz (80.65 kg)  05/21/15 180 lb 5.4 oz (81.8 kg)  05/16/15 179 lb 6.4 oz (81.375 kg)     Cardiac Studies Reviewed: 2D Echo: normal LV systolic function, mean transaortic valve gradient 26 mmHg, no paravalvular regurgitation, formal interpretation pending.   ASSESSMENT AND PLAN: 1.  Aortic valve disorder s/p valve-in-valve TAVR: pt with NYHA II sx's of CHF, complicated by volume overload related to recent fluid resuscitation for urosepsis. Today's echo is personally reviewed. His bioprosthetic aortic valve gradients are elevated but stable and consistent with previous study after valve in valve TAVR. Will plan to repeat an echo in one year. He understands the need for SBE prophylaxis.  2. Acute on chronic diastolic heart failure: The patient does have signs of volume overload on exam. This is complicated by stage IV chronic kidney disease. Advise  increase furosemide to  80 mg twice daily and take metolazone 2.5 mg tomorrow morning. He will continue to use this as needed but no more than 2 days per week. We'll follow-up with a metabolic panel in 2 weeks. I will see him in 3 months.  3. Paroxysmal atrial fibrillation: Continue amiodarone. Not a candidate for anticoagulation because of severe thrombocytopenia with platelets in 20-30,000 range  4. Pancytopenia: followed closely by Dr Benay Spice.   Current medicines are reviewed with the patient today.  The patient does not have concerns regarding medicines.  Labs/ tests ordered today include:   Orders Placed This Encounter  Procedures  . Basic Metabolic Panel (BMET)   Disposition:   FU 3 months  Signed, [name], MD  05/30/2015 1:15 PM    [facility] [address], [place], [place]  [address] Phone: [phone]; Fax: [phone]

## 2015-05-30 NOTE — Addendum Note (Signed)
Addended by: Mauricio Po D on: 05/30/2015 09:45 PM   Modules accepted: Level of Service, SmartSet

## 2015-05-30 NOTE — Progress Notes (Signed)
Pre visit review using our clinic review tool, if applicable. No additional management support is needed unless otherwise documented below in the visit note. 

## 2015-05-30 NOTE — Assessment & Plan Note (Signed)
Continues to complete current dosage of Omnicef with 2-3 days remaining for completion of course. Denies fevers or other urinary symptoms. Symptoms of systemic inflammatory response syndrome appear resolved with vital signs being stable. Follow-up with urology as scheduled.

## 2015-06-02 ENCOUNTER — Other Ambulatory Visit (HOSPITAL_COMMUNITY): Payer: Commercial Managed Care - HMO

## 2015-06-02 ENCOUNTER — Ambulatory Visit: Payer: Commercial Managed Care - HMO | Admitting: Cardiovascular Disease

## 2015-06-03 ENCOUNTER — Telehealth: Payer: Self-pay | Admitting: Cardiovascular Disease

## 2015-06-03 NOTE — Telephone Encounter (Signed)
New message    Pt wants rn to call he states that he wants to inform her that he has lost a total of 166.6 pounds

## 2015-06-03 NOTE — Telephone Encounter (Signed)
Pt calling to update [place] on how he is doing. Pt states when he came back from [place] he weighed 194lbs.  Today he weighs 166.6 lbs. Pt states he still has some slight swelling in bilateral LE but it is much improved. Denies SOB and is tolerating Furosemide 80mg  BID well. Has follow up labs on 6/5. Pt states that he is feeling much better. Will route to Dr. Burt Knack to make him aware.

## 2015-06-10 ENCOUNTER — Other Ambulatory Visit (INDEPENDENT_AMBULATORY_CARE_PROVIDER_SITE_OTHER): Payer: Commercial Managed Care - HMO | Admitting: *Deleted

## 2015-06-10 ENCOUNTER — Other Ambulatory Visit: Payer: Self-pay

## 2015-06-10 DIAGNOSIS — Z952 Presence of prosthetic heart valve: Secondary | ICD-10-CM

## 2015-06-10 DIAGNOSIS — I5033 Acute on chronic diastolic (congestive) heart failure: Secondary | ICD-10-CM

## 2015-06-10 DIAGNOSIS — I350 Nonrheumatic aortic (valve) stenosis: Secondary | ICD-10-CM

## 2015-06-10 LAB — BASIC METABOLIC PANEL
BUN: 91 mg/dL — ABNORMAL HIGH (ref 7–25)
CHLORIDE: 100 mmol/L (ref 98–110)
CO2: 20 mmol/L (ref 20–31)
Calcium: 8.8 mg/dL (ref 8.6–10.3)
Creat: 2 mg/dL — ABNORMAL HIGH (ref 0.70–1.11)
GLUCOSE: 149 mg/dL — AB (ref 65–99)
POTASSIUM: 4.3 mmol/L (ref 3.5–5.3)
SODIUM: 133 mmol/L — AB (ref 135–146)

## 2015-06-13 ENCOUNTER — Other Ambulatory Visit: Payer: Commercial Managed Care - HMO

## 2015-06-28 ENCOUNTER — Ambulatory Visit (HOSPITAL_BASED_OUTPATIENT_CLINIC_OR_DEPARTMENT_OTHER): Payer: Commercial Managed Care - HMO | Admitting: Oncology

## 2015-06-28 ENCOUNTER — Other Ambulatory Visit: Payer: Self-pay | Admitting: Cardiovascular Disease

## 2015-06-28 ENCOUNTER — Other Ambulatory Visit (HOSPITAL_BASED_OUTPATIENT_CLINIC_OR_DEPARTMENT_OTHER): Payer: Commercial Managed Care - HMO

## 2015-06-28 ENCOUNTER — Telehealth: Payer: Self-pay | Admitting: Oncology

## 2015-06-28 ENCOUNTER — Ambulatory Visit: Payer: Commercial Managed Care - HMO | Admitting: Internal Medicine

## 2015-06-28 VITALS — BP 109/70 | HR 80 | Temp 97.9°F | Resp 18 | Ht 70.0 in | Wt 175.9 lb

## 2015-06-28 DIAGNOSIS — D696 Thrombocytopenia, unspecified: Secondary | ICD-10-CM

## 2015-06-28 DIAGNOSIS — D61818 Other pancytopenia: Secondary | ICD-10-CM

## 2015-06-28 DIAGNOSIS — R161 Splenomegaly, not elsewhere classified: Secondary | ICD-10-CM

## 2015-06-28 LAB — CBC WITH DIFFERENTIAL/PLATELET
BASO%: 0.4 % (ref 0.0–2.0)
BASOS ABS: 0 10*3/uL (ref 0.0–0.1)
EOS ABS: 0 10*3/uL (ref 0.0–0.5)
EOS%: 1.2 % (ref 0.0–7.0)
HCT: 29.2 % — ABNORMAL LOW (ref 38.4–49.9)
HEMOGLOBIN: 9.8 g/dL — AB (ref 13.0–17.1)
LYMPH%: 18.3 % (ref 14.0–49.0)
MCH: 29.4 pg (ref 27.2–33.4)
MCHC: 33.6 g/dL (ref 32.0–36.0)
MCV: 87.7 fL (ref 79.3–98.0)
MONO#: 0.2 10*3/uL (ref 0.1–0.9)
MONO%: 6 % (ref 0.0–14.0)
NEUT#: 1.9 10*3/uL (ref 1.5–6.5)
NEUT%: 74.1 % (ref 39.0–75.0)
NRBC: 0 % (ref 0–0)
Platelets: 39 10*3/uL — ABNORMAL LOW (ref 140–400)
RBC: 3.33 10*6/uL — ABNORMAL LOW (ref 4.20–5.82)
RDW: 17.3 % — AB (ref 11.0–14.6)
WBC: 2.5 10*3/uL — AB (ref 4.0–10.3)
lymph#: 0.5 10*3/uL — ABNORMAL LOW (ref 0.9–3.3)

## 2015-06-28 LAB — TECHNOLOGIST REVIEW

## 2015-06-28 NOTE — Progress Notes (Signed)
  [name] OFFICE PROGRESS NOTE   Diagnosis: Pancytopenia  INTERVAL HISTORY:   [name] returns as scheduled. He denies bleeding other than easy bruising. He bumped the right arm earlier today and developed a bruise. No fever. No significant dyspnea. He just returned from a trip to the beach.  Objective:  Vital signs in last 24 hours:  Blood pressure 109/70, pulse 80, temperature 97.9 F (36.6 C), temperature source Oral, resp. rate 18, height '5\' 10"'$  (1.778 m), weight 175 lb 14.4 oz (79.788 kg), SpO2 100 %.    HEENT: No thrush or bleeding Resp: Lungs clear bilaterally Cardio: Irregular GI: No hepatomegaly, the spleen tip is palpable in the left mid abdomen with associated tenderness Vascular: Trace edema at the lower leg bilaterally with support stockings in place  Skin: Diffuse dark discoloration over the arms with an ecchymosis/small hematoma near the right elbow     Lab Results:  Lab Results  Component Value Date   WBC 2.5* 06/28/2015   HGB 9.8* 06/28/2015   HCT 29.2* 06/28/2015   MCV 87.7 06/28/2015   PLT 39* 06/28/2015   NEUTROABS 1.9 06/28/2015     Medications: I have reviewed the patient's current medications.  Assessment/Plan: 1. Chronic pancytopenia/splenomegaly-likely related to a chronic lymphoproliferative disorder,? Splenic lymphoma versus cirrhosis  Bone marrow biopsy at [facility] November 2012-slightly hypercellular marrow with trilineage hematopoiesis, interstitial Nieman-Pick like histiocytosis. Negative for dysplasia, negative for lymphoma, negative for increased blasts. 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 History of severe aortic stenosis, status post aortic valve replacement surgery at [facility] on 01/23/2011; status post transcatheter aortic valve replacement 05/14/2014  3. History of gout  4. Diabetes  5. Streptococcus bacteremia-TEE negative for endocarditis  6.  Malaise-likely multifactorial 7. Renal insufficiency  8. Paroxysmal atrial fibrillation  9. Colon polyps noted on a virtual colonoscopy 02/10/2013  10. Anemia secondary to renal insufficiency and the chronic lymphoproliferative disorder. Trial of weekly erythropoietin initiated 04/09/2014 with improvement. The anemia progressed. Erythropoietin resumed 07/02/2014. Discontinued 09/29/2014 secondary to patient not experiencing clinical benefit 11. Right ear skin lesion-possibly a basal cell carcinoma, observe for now 12. Hospitalization with pyelonephritis in [place] April 2017 13. Admission 05/18/2015 with urosepsis   Disposition:  [name] is stable from a hematologic standpoint. We continue to observe the pancytopenia. He will contact us for a fever or bleeding. I suggested he try ice at the right arm ecchymosis.  He will return for a CBC in 6 weeks and an office visit in 3 months.  Betsy Coder, MD  06/28/2015  10:55 AM

## 2015-06-28 NOTE — Telephone Encounter (Signed)
Gave and printed appt sched and avs for pt for July and Aug °

## 2015-06-29 ENCOUNTER — Ambulatory Visit (INDEPENDENT_AMBULATORY_CARE_PROVIDER_SITE_OTHER): Payer: Commercial Managed Care - HMO | Admitting: Internal Medicine

## 2015-06-29 ENCOUNTER — Encounter: Payer: Self-pay | Admitting: Internal Medicine

## 2015-06-29 ENCOUNTER — Other Ambulatory Visit (INDEPENDENT_AMBULATORY_CARE_PROVIDER_SITE_OTHER): Payer: Commercial Managed Care - HMO

## 2015-06-29 VITALS — BP 100/58 | HR 82 | Temp 97.8°F | Resp 16 | Wt 176.0 lb

## 2015-06-29 DIAGNOSIS — E039 Hypothyroidism, unspecified: Secondary | ICD-10-CM

## 2015-06-29 DIAGNOSIS — R600 Localized edema: Secondary | ICD-10-CM

## 2015-06-29 DIAGNOSIS — I10 Essential (primary) hypertension: Secondary | ICD-10-CM | POA: Diagnosis not present

## 2015-06-29 DIAGNOSIS — K219 Gastro-esophageal reflux disease without esophagitis: Secondary | ICD-10-CM | POA: Diagnosis not present

## 2015-06-29 DIAGNOSIS — N183 Chronic kidney disease, stage 3 unspecified: Secondary | ICD-10-CM

## 2015-06-29 DIAGNOSIS — H9201 Otalgia, right ear: Secondary | ICD-10-CM

## 2015-06-29 DIAGNOSIS — H9209 Otalgia, unspecified ear: Secondary | ICD-10-CM | POA: Insufficient documentation

## 2015-06-29 DIAGNOSIS — E119 Type 2 diabetes mellitus without complications: Secondary | ICD-10-CM

## 2015-06-29 LAB — BASIC METABOLIC PANEL
BUN: 72 mg/dL — AB (ref 6–23)
CALCIUM: 9.3 mg/dL (ref 8.4–10.5)
CO2: 24 meq/L (ref 19–32)
CREATININE: 1.85 mg/dL — AB (ref 0.40–1.50)
Chloride: 104 mEq/L (ref 96–112)
GFR: 36.58 mL/min — ABNORMAL LOW (ref >60.00)
GLUCOSE: 122 mg/dL — AB (ref 70–99)
Potassium: 4.6 mEq/L (ref 3.5–5.1)
SODIUM: 135 meq/L (ref 135–145)

## 2015-06-29 NOTE — Patient Instructions (Signed)
  Test(s) ordered today. Your results will be released to [place] (or called to you) after review, usually within 72hours after test completion. If any changes need to be made, you will be notified at that same time.   Medications reviewed and updated.   No changes recommended at this time.   A referral was ordered for ENT - Dr Erik Obey.   Please followup in 6 months

## 2015-06-29 NOTE — Progress Notes (Signed)
Pre visit review using our clinic review tool, if applicable. No additional management support is needed unless otherwise documented below in the visit note. 

## 2015-06-29 NOTE — Assessment & Plan Note (Signed)
Right tragus pain - cyst Will refer to ENT for removal = present for over one year

## 2015-06-29 NOTE — Assessment & Plan Note (Signed)
Lab Results  Component Value Date   HGBA1C 5.7 05/09/2015   Well controlled, not on medication  will monitor a1c

## 2015-06-29 NOTE — Assessment & Plan Note (Signed)
Bmp today - will forward to cardiology

## 2015-06-29 NOTE — Assessment & Plan Note (Signed)
BP reasonably controlled Medications per cardiology

## 2015-06-29 NOTE — Assessment & Plan Note (Signed)
Recent tsh in normal range Continue current dose

## 2015-06-29 NOTE — Progress Notes (Signed)
Subjective:    Patient ID: [name], male    DOB: Jul 19, 1922, 82 y.o.   MRN: [id number]  HPI He is here for follow up.  Thrombocytopenia/pancytopenia/splenomegaly:  He is following with oncology.  It is thought he likely has a chronic lymphproliferative disorder, ? Splenic lymphoma vs cirrhosis.  He has recently seen oncology and his blood counts improved slightly.    CAD, Afib, AS (s/p AVR) with severe bioprosthetic valve dysfunction with valve in valve replacement, diastolic CHF, htn:  He is following with cardiology.  His swelling in his legs is stable and slightly better than when I saw him last.  His lasix dose was decreased and he is not taking the zaroxolyn.  He wears his compression stockings daily.  He has palpitations, but denies chest pain and sob.    Right ear tenderness:  He has persistent soreness in the right ear - the lower end of the tragus.  He has had pain for at least one year. He has swelling there and has had occasional discharge from it, which makes it feel better, but it does not last long.  He would like to have it drained or removed.  Hypothyroidism:  He is taking his medication daily.  He denies any recent changes in energy or weight that are unexplained.   Diabetes: He is controlling his diabetes with diet. He is compliant with a diabetic diet. He is exercising regularly.  He checks his feet daily and denies foot lesions. He is up-to-date with an ophthalmology examination.    Medications and allergies reviewed with patient and updated if appropriate.  Patient Active Problem List   Diagnosis Date Noted  . Sepsis due to Escherichia coli ([place])   . UTI (lower urinary tract infection) 05/18/2015  . Hypotension 05/18/2015  . Acute on chronic diastolic CHF (congestive heart failure) ([place]) 05/18/2015  . Septic shock ([place])   . Sepsis ([place]) 05/09/2015  . Hypervolemia 05/09/2015  . Cellulitis of left upper extremity 03/11/2015  . QT prolongation   .  Generalized weakness 10/04/2014  . Chest discomfort 10/04/2014  . GERD (gastroesophageal reflux disease) 10/04/2014  . Chronic diastolic congestive heart failure ([place]) 10/04/2014  . Impingement syndrome of right shoulder region 05/21/2014  . Severe aortic stenosis 05/14/2014  . S/P TAVR (transcatheter aortic valve replacement) 05/14/2014  . Severe aortic insufficiency 04/05/2014  . Depression 10/02/2013  . Cellulitis of leg, left 08/01/2013  . Aortic atherosclerosis ([place]) 01/06/2013  . PAF (paroxysmal atrial fibrillation) ([place]) 01/06/2013  . Lower extremity edema 01/06/2013  . CKD (chronic kidney disease), stage III 01/06/2013  . Bacteremia 12/30/2012  . S/P AVR 12/13/2012  . Protein-calorie malnutrition, severe ([place]) 12/13/2012  . Sinus bradycardia 10/04/2011  . Hypothyroidism 02/27/2011  . Restless legs 02/27/2011  . Other pancytopenia ([place]) 12/06/2009  . HYPERURICEMIA, ASYMPTOMATIC 07/25/2009  . HYPERGLYCEMIA, FASTING 01/05/2009  . CAD, NATIVE VESSEL 12/21/2008  . Diabetes mellitus ([place]) 10/06/2008  . Aortic valve disorder 05/15/2008  . ACTION TREMOR 01/14/2007  . POPLITEAL CYST, RIGHT 11/05/2006  . Gout 05/13/2006  . Thrombocytopenia ([place]) 05/13/2006  . ANXIETY 05/13/2006  . Essential hypertension 05/13/2006  . CVA 05/13/2006  . [place] DISEASE 05/13/2006  . Splenomegaly 05/13/2006    Current Outpatient Prescriptions on File Prior to Visit  Medication Sig Dispense Refill  . allopurinol (ZYLOPRIM) 100 MG tablet Take 100 mg by mouth daily.    [name] Kitchen amiodarone (PACERONE) 200 MG tablet Take 1 tablet by mouth daily 90 tablet 0  .  Cholecalciferol (VITAMIN D) 1000 UNITS capsule Take 1 capsule (1,000 Units total) by mouth daily. 90 capsule 3  . clonazePAM (KLONOPIN) 0.5 MG tablet Take 0.25 mg by mouth daily as needed (Take 1/2 tablet once daily as needed if you wake up early).    . colchicine 0.6 MG tablet Take 0.6 mg by mouth daily as needed (gout flares).     . ferrous  sulfate 325 (65 FE) MG tablet Take 325 mg by mouth 2 (two) times daily with a meal.    . fish oil-omega-3 fatty acids 1000 MG capsule Take 1 g by mouth daily.      . Flaxseed, Linseed, (FLAX SEED OIL) 1000 MG CAPS Take 1 capsule by mouth daily.     . furosemide (LASIX) 80 MG tablet Take 80mg  in the morning and 40mg  in the afternoon 60 tablet 6  . levothyroxine (SYNTHROID, LEVOTHROID) 25 MCG tablet Take 25 mcg by mouth daily before breakfast.    . metolazone (ZAROXOLYN) 2.5 MG tablet Take 1 tablet (2.5 mg total) by mouth daily as needed (edema). 30 tablet 3  . Multiple Vitamin (MULTIVITAMIN) tablet Take 1 tablet by mouth daily.      [name] Kitchen omeprazole (PRILOSEC) 20 MG capsule TAKE 1 CAPSULE EVERY DAY  (REPLACES  PANTOPRAZOLE) 90 capsule 3  . potassium chloride (K-DUR) 10 MEQ tablet Take 1 tablet (10 mEq total) by mouth daily. 90 tablet 1  . propranolol (INDERAL) 10 MG tablet Take 1 tablet (10 mg total) by mouth 2 (two) times daily. 180 tablet 3  . sertraline (ZOLOFT) 50 MG tablet TAKE 1 TABLET (50 MG TOTAL) BY MOUTH AT BEDTIME. 90 tablet 0  . traMADol (ULTRAM) 50 MG tablet Take 50 mg by mouth every 8 (eight) hours as needed (pain). Reported on 06/28/2015    . vitamin C (ASCORBIC ACID) 500 MG tablet Take 1 tablet (500 mg total) by mouth daily. 90 tablet 3   No current facility-administered medications on file prior to visit.    Past Medical History  Diagnosis Date  . Stroke ([place])   . PAF (paroxysmal atrial fibrillation) (H[facility])     a. amiodarone therapy; not felt to be a candidate for anticoagulation with pancytopenia. b. Back in AF 09/2014 - decision was made to pursue rate control only.  . Bradycardia   . Aortic stenosis     a. Previously severe -> s/p minimally invasive tissue AVR with Dr. Evelina Dun at [facility] 01/2011 (pre-AVR cath with no obs CAD);  b. 05/2014 s/p TAVR (23 mm Edwards Sapien 3).  . Hypertension   . Pancytopenia ([place])     a. possible chronic lymphoproliferative disorder or splenic lymphoma.   . Action tremor   . Thrombocytopenia ([place])     Dr. Benay Spice  . Colon polyp   . Anxiety   . Splenomegaly   . Gout   . GERD (gastroesophageal reflux disease)   . Hypothyroidism   . Leukopenia     Chronic pancytopenia  . Degenerative disc disease   . Joint effusion, knee     left knee  . Synovial cyst of popliteal space   . Cellulitis of left leg 10/11-16/2012  . CKD (chronic kidney disease), stage III   . Chronic diastolic CHF (congestive heart failure) ([place])     a. 12/15/2012 TEE: EF 60-65%, no veg.  . Bacteremia     a. 12/2012 - S bovis;  b. 12/2012 TEE w/o veg;  c. Seeing ID->Rocephin therapy extended to 01/25/2013 via PICC for possible  endocarditis (No veg on TEE).  . High cholesterol   . Diabetes mellitus     "borderline" (01/05/2013)  . History of blood transfusion 01/2011; 11/2012  . H/O hiatal hernia   . Throat cancer ([place])     s/p lasered  . Diverticulosis   . S/P TAVR (transcatheter aortic valve replacement)     a. 05/14/2014 TAVR: 23 mm Edwards Sapien 3 transcatheter heart valve placed valve-in-valve for prosthetic valve dysfunction via open right transfemoral approach  . QT prolongation   . Hypotension     Past Surgical History  Procedure Laterality Date  . Cholecystectomy    . Joint effusion      left knee  . Aortic valve replacement  01/23/2011    via minimally invasive approach per Dr Evelina Dun, [facility]  . Tee without cardioversion N/A 12/15/2012    Procedure: TRANSESOPHAGEAL ECHOCARDIOGRAM (TEE);  Surgeon: [name], MD;  Location: [facility] ENDOSCOPY;  Service: Cardiovascular;  Laterality: N/A;  . Cardiac valve replacement    . Inguinal hernia repair Right   . Excisional hemorrhoidectomy    . Cardiac catheterization    . Surgery scrotal / testicular      "removed one" (01/05/2013)  . 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 Tee without cardioversion N/A 03/26/2014    Procedure: TRANSESOPHAGEAL ECHOCARDIOGRAM (TEE);  Surgeon: Josue Hector, MD;  Location: [facility] ENDOSCOPY;  Service: Cardiovascular;  Laterality: N/A;  . Transcatheter aortic valve replacement, transfemoral Right 05/14/2014    Procedure: TRANSCATHETER AORTIC VALVE REPLACEMENT, TRANSFEMORAL;  Surgeon: [name], MD;  Location: [place];  Service: Open Heart Surgery;  Laterality: Right;  . Tee without cardioversion N/A 05/14/2014    Procedure: TRANSESOPHAGEAL ECHOCARDIOGRAM (TEE);  Surgeon: [name], MD;  Location: [place];  Service: Open Heart Surgery;  Laterality: N/A;    Social History   Social History  . Marital Status: Widowed    Spouse Name: N/A  . Number of Children: 3  . Years of Education: N/A   Occupational History  . Retired Agricultural consultant    Social History Main Topics  . Smoking status: Former Smoker -- 0.75 packs/day for 24 years    Types: Cigarettes    Quit date: 01/11/1962  . Smokeless tobacco: Never Used  . Alcohol Use: No  . Drug Use: No  . Sexual Activity: No   Other Topics Concern  . Not on file   Social History Narrative   [place]ives at his farm outside of [place]   Lives with his wife   Smoked until 1964 about 7 cigarettes a day for 30 years   No alcohol history.   No drug history   Very active, no regimented exercise                Family History  Problem Relation Age of Onset  . Colon cancer Other   . Stroke Other   . Lung cancer Mother   . Heart disease Father   . Cancer Brother     Review of Systems  Constitutional: Positive for appetite change (appetite fair). Negative for fever.  Respiratory: Negative for cough, shortness of breath and wheezing.   Cardiovascular: Positive for palpitations and leg swelling (chronic, L > R). Negative for chest pain.  Gastrointestinal: Negative for nausea and abdominal pain.  No gerd  Genitourinary: Negative for dysuria and hematuria.  Neurological: Negative  for dizziness, light-headedness and headaches.       Objective:   Filed Vitals:   06/29/15 1359  BP: 100/58  Pulse: 82  Temp: 97.8 F (36.6 C)  Resp: 16   Filed Weights   06/29/15 1359  Weight: 176 lb (79.833 kg)   Body mass index is 25.25 kg/(m^2).   Physical Exam Constitutional: Appears well-developed and well-nourished. No distress.  Neck: Neck supple. No tracheal deviation present. No thyromegaly present.  No carotid bruit. No cervical adenopathy.   Cardiovascular: Normal rate, regular rhythm and normal heart sounds.   No murmur heard. 2+ LE edema- wearing compression stockings. Pulmonary/Chest: Effort normal and breath sounds normal. No respiratory distress. No wheezes.        Assessment & Plan:   See Problem List for Assessment and Plan of chronic medical problems.   F/u in 6 months

## 2015-07-18 ENCOUNTER — Telehealth: Payer: Self-pay | Admitting: Internal Medicine

## 2015-07-18 NOTE — Telephone Encounter (Signed)
ok 

## 2015-07-18 NOTE — Telephone Encounter (Signed)
Please advise. We are completely booked today

## 2015-07-18 NOTE — Telephone Encounter (Signed)
Pt has appt scheduled tomorrow with [place].

## 2015-07-18 NOTE — Telephone Encounter (Signed)
PLEASE NOTE: All timestamps contained within this report are represented as [place] Standard Time. CONFIDENTIALTY NOTICE: This fax transmission is intended only for the addressee. It contains information that is legally privileged, confidential or otherwise protected from use or disclosure. If you are not the intended recipient, you are strictly prohibited from reviewing, disclosing, copying using or disseminating any of this information or taking any action in reliance on or regarding this information. If you have received this fax in error, please notify us immediately by telephone so that we can arrange for its return to [place]. Phone: [phone], Toll-Free: [phone], Fax: [phone] Page: 1 of 1 Call Id: [id number] [facility] - Client [place] Patient Name: [name] DOB: [birth year] Initial Comment Caller states has 2 places injured on leg Nurse Assessment Nurse: Dimas Chyle, RN, Dellis Filbert Date/Time (Eastern Time): 07/18/2015 9:36:22 AM Confirm and document reason for call. If symptomatic, describe symptoms. You must click the next button to save text entered. ---Caller states has 2 places injured on leg. Injured leg on 07/13/15. Redness on left leg. Has the patient traveled out of the country within the last 30 days? ---No Does the patient have any new or worsening symptoms? ---Yes Will a triage be completed? ---Yes Related visit to physician within the last 2 weeks? ---No Does the PT have any chronic conditions? (i.e. diabetes, asthma, etc.) ---Yes List chronic conditions. ---HTN Is this a behavioral health or substance abuse call? ---No Guidelines Guideline Title Affirmed Question Affirmed Notes Leg Injury [1] After 3 days AND [2] pain not improved Final Disposition User See PCP When Office is Open (within 3 days) Dimas Chyle, RN, [employer] Referrals REFERRED TO PCP OFFICE Disagree/Comply: [facility]

## 2015-07-19 ENCOUNTER — Ambulatory Visit (INDEPENDENT_AMBULATORY_CARE_PROVIDER_SITE_OTHER): Payer: Commercial Managed Care - HMO | Admitting: Internal Medicine

## 2015-07-19 ENCOUNTER — Encounter: Payer: Self-pay | Admitting: Internal Medicine

## 2015-07-19 VITALS — BP 118/76 | HR 78 | Temp 98.0°F | Resp 16 | Wt 181.0 lb

## 2015-07-19 DIAGNOSIS — M25511 Pain in right shoulder: Secondary | ICD-10-CM

## 2015-07-19 DIAGNOSIS — L03115 Cellulitis of right lower limb: Secondary | ICD-10-CM | POA: Diagnosis not present

## 2015-07-19 DIAGNOSIS — L03116 Cellulitis of left lower limb: Secondary | ICD-10-CM | POA: Insufficient documentation

## 2015-07-19 MED ORDER — CEPHALEXIN 500 MG PO CAPS: 500.0000 mg | ORAL_CAPSULE | Freq: Three times a day (TID) | ORAL | Status: DC

## 2015-07-19 NOTE — Progress Notes (Signed)
Pre visit review using our clinic review tool, if applicable. No additional management support is needed unless otherwise documented below in the visit note. 

## 2015-07-19 NOTE — Patient Instructions (Signed)
Take the antibiotic as prescribed. Call if you have a fever, feel sick or the redness/swelling worsen in your leg.    Call with any questions or concerns.

## 2015-07-20 ENCOUNTER — Encounter (HOSPITAL_BASED_OUTPATIENT_CLINIC_OR_DEPARTMENT_OTHER): Payer: Self-pay | Admitting: *Deleted

## 2015-07-20 NOTE — Progress Notes (Signed)
Subjective:    Patient ID: [name], male    DOB: 1922-04-25, 82 y.o.   MRN: [id number]  HPI He is here for an acute visit.   He has two concerning areas on his right leg for infection.  He noticed them a couple of days ago and his family encouraged him to come in to have it evaluated.  He has red area on the medial aspect of his leg by his knee.  He denies trauma and it does not hurt. He thinks the upper portion is fading.  He has two wounds on the lateral aspect on his lateral leg.  One as some surrounding redness and is tender.  He denies any discharge.  Both wounds have scabs and are a result of mild trauma.    He denies fever and overall feels fine.  He was concerned about the possibility of infection.  His legs are chronically swollen and he wears compression socks daily.  There has been no change in his swelling.  He denies SOB, chest pain, palps or ligthheadedness.   Medications and allergies reviewed with patient and updated if appropriate.  Patient Active Problem List   Diagnosis Date Noted  . Cellulitis of leg, right 07/19/2015  . Ear pain 06/29/2015  . Sepsis due to Escherichia coli ([place])   . UTI (lower urinary tract infection) 05/18/2015  . Hypotension 05/18/2015  . Acute on chronic diastolic CHF (congestive heart failure) ([place]) 05/18/2015  . Septic shock ([place])   . Sepsis ([place]) 05/09/2015  . Hypervolemia 05/09/2015  . Cellulitis of left upper extremity 03/11/2015  . QT prolongation   . Generalized weakness 10/04/2014  . Chest discomfort 10/04/2014  . GERD (gastroesophageal reflux disease) 10/04/2014  . Chronic diastolic congestive heart failure ([place]) 10/04/2014  . Impingement syndrome of right shoulder region 05/21/2014  . Severe aortic stenosis 05/14/2014  . S/P TAVR (transcatheter aortic valve replacement) 05/14/2014  . Severe aortic insufficiency 04/05/2014  . Depression 10/02/2013  . Cellulitis of leg, left 08/01/2013  . Aortic atherosclerosis ([place])  01/06/2013  . PAF (paroxysmal atrial fibrillation) ([place]) 01/06/2013  . Lower extremity edema 01/06/2013  . CKD (chronic kidney disease), stage III 01/06/2013  . Bacteremia 12/30/2012  . S/P AVR 12/13/2012  . Protein-calorie malnutrition, severe ([place]) 12/13/2012  . Sinus bradycardia 10/04/2011  . Hypothyroidism 02/27/2011  . Restless legs 02/27/2011  . Other pancytopenia ([place]) 12/06/2009  . HYPERURICEMIA, ASYMPTOMATIC 07/25/2009  . HYPERGLYCEMIA, FASTING 01/05/2009  . CAD, NATIVE VESSEL 12/21/2008  . Diabetes mellitus ([place]) 10/06/2008  . Aortic valve disorder 05/15/2008  . ACTION TREMOR 01/14/2007  . POPLITEAL CYST, RIGHT 11/05/2006  . Gout 05/13/2006  . Thrombocytopenia ([place]) 05/13/2006  . ANXIETY 05/13/2006  . Essential hypertension 05/13/2006  . CVA 05/13/2006  . [place] DISEASE 05/13/2006  . Splenomegaly 05/13/2006    Current Outpatient Prescriptions on File Prior to Visit  Medication Sig Dispense Refill  . allopurinol (ZYLOPRIM) 100 MG tablet Take 100 mg by mouth daily.    [name] Kitchen amiodarone (PACERONE) 200 MG tablet Take 1 tablet by mouth daily 90 tablet 0  . Cholecalciferol (VITAMIN D) 1000 UNITS capsule Take 1 capsule (1,000 Units total) by mouth daily. 90 capsule 3  . clonazePAM (KLONOPIN) 0.5 MG tablet Take 0.25 mg by mouth daily as needed (Take 1/2 tablet once daily as needed if you wake up early).    . colchicine 0.6 MG tablet Take 0.6 mg by mouth daily as needed (gout flares).     [name] Kitchen  ferrous sulfate 325 (65 FE) MG tablet Take 325 mg by mouth 2 (two) times daily with a meal.    . fish oil-omega-3 fatty acids 1000 MG capsule Take 1 g by mouth daily.      . Flaxseed, Linseed, (FLAX SEED OIL) 1000 MG CAPS Take 1 capsule by mouth daily.     . furosemide (LASIX) 80 MG tablet Take 80mg  in the morning and 40mg  in the afternoon 60 tablet 6  . levothyroxine (SYNTHROID, LEVOTHROID) 25 MCG tablet Take 25 mcg by mouth daily before breakfast.    . metolazone (ZAROXOLYN) 2.5 MG  tablet Take 1 tablet (2.5 mg total) by mouth daily as needed (edema). 30 tablet 3  . Multiple Vitamin (MULTIVITAMIN) tablet Take 1 tablet by mouth daily.      [name] Kitchen omeprazole (PRILOSEC) 20 MG capsule TAKE 1 CAPSULE EVERY DAY  (REPLACES  PANTOPRAZOLE) 90 capsule 3  . potassium chloride (K-DUR) 10 MEQ tablet Take 1 tablet (10 mEq total) by mouth daily. 90 tablet 1  . propranolol (INDERAL) 10 MG tablet Take 1 tablet (10 mg total) by mouth 2 (two) times daily. 180 tablet 3  . sertraline (ZOLOFT) 50 MG tablet TAKE 1 TABLET (50 MG TOTAL) BY MOUTH AT BEDTIME. 90 tablet 0  . traMADol (ULTRAM) 50 MG tablet Take 50 mg by mouth every 8 (eight) hours as needed (pain). Reported on 06/28/2015    . vitamin C (ASCORBIC ACID) 500 MG tablet Take 1 tablet (500 mg total) by mouth daily. 90 tablet 3   No current facility-administered medications on file prior to visit.    Past Medical History  Diagnosis Date  . Stroke ([place])   . PAF (paroxysmal atrial fibrillation) (HCC)     a. amiodarone therapy; not felt to be a candidate for anticoagulation with pancytopenia. b. Back in AF 09/2014 - decision was made to pursue rate control only.  . Bradycardia   . Aortic stenosis     a. Previously severe -> s/p minimally invasive tissue AVR with Dr. Evelina Dun at [facility] 01/2011 (pre-AVR cath with no obs CAD);  b. 05/2014 s/p TAVR (23 mm Edwards Sapien 3).  . Hypertension   . Pancytopenia ([place])     a. possible chronic lymphoproliferative disorder or splenic lymphoma.  . Action tremor   . Thrombocytopenia ([place])     Dr. Benay Spice  . Colon polyp   . Anxiety   . Splenomegaly   . Gout   . GERD (gastroesophageal reflux disease)   . Hypothyroidism   . Leukopenia     Chronic pancytopenia  . Degenerative disc disease   . Joint effusion, knee     left knee  . Synovial cyst of popliteal space   . Cellulitis of left leg 10/11-16/2012  . CKD (chronic kidney disease), stage III   . Chronic diastolic CHF (congestive heart failure) ([place])      a. 12/15/2012 TEE: EF 60-65%, no veg.  . Bacteremia     a. 12/2012 - S bovis;  b. 12/2012 TEE w/o veg;  c. Seeing ID->Rocephin therapy extended to 01/25/2013 via PICC for possible endocarditis (No veg on TEE).  . High cholesterol   . Diabetes mellitus     "borderline" (01/05/2013)  . History of blood transfusion 01/2011; 11/2012  . H/O hiatal hernia   . Throat cancer ([place])     s/p lasered  . Diverticulosis   . S/P TAVR (transcatheter aortic valve replacement)     a. 05/14/2014 TAVR: 23 mm Edwards Sapien  3 transcatheter heart valve placed valve-in-valve for prosthetic valve dysfunction via open right transfemoral approach  . QT prolongation   . Hypotension     Past Surgical History  Procedure Laterality Date  . Cholecystectomy    . Joint effusion      left knee  . Aortic valve replacement  01/23/2011    via minimally invasive approach per Dr Evelina Dun, [facility]  . Tee without cardioversion N/A 12/15/2012    Procedure: TRANSESOPHAGEAL ECHOCARDIOGRAM (TEE);  Surgeon: [name], MD;  Location: [facility] ENDOSCOPY;  Service: Cardiovascular;  Laterality: N/A;  . Cardiac valve replacement    . Inguinal hernia repair Right   . Excisional hemorrhoidectomy    . Cardiac catheterization    . Surgery scrotal / testicular      "removed one" (01/05/2013)  . 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 Tee without cardioversion N/A 03/26/2014    Procedure: TRANSESOPHAGEAL ECHOCARDIOGRAM (TEE);  Surgeon: Josue Hector, MD;  Location: [facility] ENDOSCOPY;  Service: Cardiovascular;  Laterality: N/A;  . Transcatheter aortic valve replacement, transfemoral Right 05/14/2014    Procedure: TRANSCATHETER AORTIC VALVE REPLACEMENT, TRANSFEMORAL;  Surgeon: [name], MD;  Location: [place];  Service: Open Heart Surgery;  Laterality: Right;  . Tee without cardioversion N/A 05/14/2014     Procedure: TRANSESOPHAGEAL ECHOCARDIOGRAM (TEE);  Surgeon: [name], MD;  Location: [place];  Service: Open Heart Surgery;  Laterality: N/A;    Social History   Social History  . Marital Status: Widowed    Spouse Name: N/A  . Number of Children: 3  . Years of Education: N/A   Occupational History  . Retired Agricultural consultant    Social History Main Topics  . Smoking status: Former Smoker -- 0.75 packs/day for 24 years    Types: Cigarettes    Quit date: 01/11/1962  . Smokeless tobacco: Never Used  . Alcohol Use: No  . Drug Use: No  . Sexual Activity: No   Other Topics Concern  . None   Social History Narrative   [place] his farm outside of [place]   Lives with his wife   Smoked until 1964 about 7 cigarettes a day for 30 years   No alcohol history.   No drug history   Very active, no regimented exercise                Family History  Problem Relation Age of Onset  . Colon cancer Other   . Stroke Other   . Lung cancer Mother   . Heart disease Father   . Cancer Brother     Review of Systems  Constitutional: Negative for fever.  Respiratory: Negative for shortness of breath.   Cardiovascular: Positive for leg swelling (chronic, at baseline). Negative for chest pain and palpitations.  Neurological: Negative for dizziness, light-headedness and headaches.       Objective:   Filed Vitals:   07/19/15 1112  BP: 118/76  Pulse: 78  Temp: 98 F (36.7 C)  Resp: 16   Filed Weights   07/19/15 1112  Weight: 181 lb (82.101 kg)   Body mass index is 25.97 kg/(m^2).   Physical Exam  Constitutional: He is oriented to person, place, and time. He appears well-developed and well-nourished. No distress.  Musculoskeletal: He exhibits edema (2+ edema RLE - at baseline per patient,  1+ edema LLE but wearing compression socks).  Neurological: He is alert and oriented to person, place, and time.  Skin: He is not diaphoretic.  Irregularly shaped patch of non-blanchable, non-tender  erythema on medial aspect of right lower leg just distal to knee - improving per patient.  Does not appear like typical cellulitis.  Two scabbed wounds on lateral aspect of right lateral lower leg - the smaller one is non-tender with no erythema or discharge, the larger one (size of a quarter) is tender and has surrounding erythema, no discharge          Assessment & Plan:   See Problem List for Assessment and Plan of chronic medical problems.

## 2015-07-20 NOTE — Assessment & Plan Note (Signed)
Early cellulitis Will start an antibiotic given his medical problems and history Keflex 500 mg TID x 7 days - he will let me know if there is still some residual infection - may need a longer course Continue compression socks He will monitor closely and call if there is no improvement / he has a fever / or the redness worsens

## 2015-07-21 ENCOUNTER — Other Ambulatory Visit: Payer: Self-pay | Admitting: Cardiovascular Disease

## 2015-07-26 ENCOUNTER — Telehealth: Payer: Self-pay | Admitting: Emergency Medicine

## 2015-07-26 NOTE — Telephone Encounter (Signed)
Pt called to check up on if Dr Quay Burow has contacted Dr Noemi Chapel to see if he can have a shot put in his shoulder. Please follow up and give him a call back thanks.

## 2015-07-26 NOTE — Telephone Encounter (Signed)
Please advise. Let me know if i need to call or write letter.

## 2015-07-26 NOTE — Telephone Encounter (Signed)
A referral was ordered - Dr Noemi Chapel will decide if he can have an injection or not.  He can call their office directly to set up an appt - not sure if he needs a referral.

## 2015-07-28 NOTE — Telephone Encounter (Signed)
SPoke with pt, he has an appt today at 3pm with Dr Noemi Chapel.

## 2015-07-29 ENCOUNTER — Other Ambulatory Visit: Payer: Self-pay | Admitting: Otolaryngology

## 2015-07-29 NOTE — H&P (Signed)
PREOPERATIVE H&P  Chief Complaint: chronic right ear sore  HPI: [name] is a 82 y.o. male who presents for evaluation of a chronic right ear sore. On exam he has approximate 1 cm lesion just below the right tragus that is consistent with a skin cancer. He's taken to the OR for excision of the cancer under local anesthesia.   Past Medical History  Diagnosis Date  . Stroke ([place])   . PAF (paroxysmal atrial fibrillation) ([facility])     a. amiodarone therapy; not felt to be a candidate for anticoagulation with pancytopenia. b. Back in AF 09/2014 - decision was made to pursue rate control only.  . Bradycardia   . Aortic stenosis     a. Previously severe -> s/p minimally invasive tissue AVR with Dr. Evelina Dun at [facility] 01/2011 (pre-AVR cath with no obs CAD);  b. 05/2014 s/p TAVR (23 mm Edwards Sapien 3).  . Hypertension   . Pancytopenia ([place])     a. possible chronic lymphoproliferative disorder or splenic lymphoma.  . Action tremor   . Thrombocytopenia ([place])     Dr. Benay Spice  . Colon polyp   . Anxiety   . Splenomegaly   . Gout   . GERD (gastroesophageal reflux disease)   . Hypothyroidism   . Leukopenia     Chronic pancytopenia  . Degenerative disc disease   . Joint effusion, knee     left knee  . Synovial cyst of popliteal space   . Cellulitis of left leg 10/11-16/2012  . CKD (chronic kidney disease), stage III   . Chronic diastolic CHF (congestive heart failure) ([place])     a. 12/15/2012 TEE: EF 60-65%, no veg.  . Bacteremia     a. 12/2012 - S bovis;  b. 12/2012 TEE w/o veg;  c. Seeing ID->Rocephin therapy extended to 01/25/2013 via PICC for possible endocarditis (No veg on TEE).  . High cholesterol   . Diabetes mellitus     "borderline" (01/05/2013)  . History of blood transfusion 01/2011; 11/2012  . H/O hiatal hernia   . Throat cancer ([place])     s/p lasered  . Diverticulosis   . S/P TAVR (transcatheter aortic valve replacement)     a. 05/14/2014 TAVR: 23 mm Edwards Sapien 3  transcatheter heart valve placed valve-in-valve for prosthetic valve dysfunction via open right transfemoral approach  . QT prolongation   . Hypotension    Past Surgical History  Procedure Laterality Date  . Cholecystectomy    . Joint effusion      left knee  . Aortic valve replacement  01/23/2011    via minimally invasive approach per Dr Evelina Dun, [facility]  . Tee without cardioversion N/A 12/15/2012    Procedure: TRANSESOPHAGEAL ECHOCARDIOGRAM (TEE);  Surgeon: [name], MD;  Location: [facility] ENDOSCOPY;  Service: Cardiovascular;  Laterality: N/A;  . Cardiac valve replacement    . Inguinal hernia repair Right   . Excisional hemorrhoidectomy    . Cardiac catheterization    . Surgery scrotal / testicular      "removed one" (01/05/2013)  . 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 Tee without cardioversion N/A 03/26/2014    Procedure: TRANSESOPHAGEAL ECHOCARDIOGRAM (TEE);  Surgeon: Josue Hector, MD;  Location: [facility] ENDOSCOPY;  Service: Cardiovascular;  Laterality: N/A;  . Transcatheter aortic valve replacement, transfemoral Right  05/14/2014    Procedure: TRANSCATHETER AORTIC VALVE REPLACEMENT, TRANSFEMORAL;  Surgeon: [name], MD;  Location: [facility];  Service: Open Heart Surgery;  Laterality: Right;  . Tee without cardioversion N/A 05/14/2014    Procedure: TRANSESOPHAGEAL ECHOCARDIOGRAM (TEE);  Surgeon: [name], MD;  Location: [facility];  Service: Open Heart Surgery;  Laterality: N/A;   Social History   Social History  . Marital Status: Widowed    Spouse Name: N/A  . Number of Children: 3  . Years of Education: N/A   Occupational History  . Retired Agricultural consultant    Social History Main Topics  . Smoking status: Former Smoker -- 0.75 packs/day for 24 years    Types: Cigarettes    Quit date: 01/11/1962  . Smokeless tobacco: Never Used  .  Alcohol Use: No  . Drug Use: No  . Sexual Activity: No   Other Topics Concern  . None   Social History Narrative   [place] farm outside of [place]   Lives with his wife   Smoked until 1964 about 7 cigarettes a day for 30 years   No alcohol history.   No drug history   Very active, no regimented exercise               Family History  Problem Relation Age of Onset  . Colon cancer Other   . Stroke Other   . Lung cancer Mother   . Heart disease Father   . Cancer Brother    Allergies  Allergen Reactions  . Penicillins Other (See Comments)    Does not remember reaction (~year 1950)  . Neomycin-Bacitracin Zn-Polymyx Other (See Comments)    ? Reaction (thinks he remembers redness)   Prior to Admission medications   Medication Sig Start Date End Date Taking? Authorizing Provider  allopurinol (ZYLOPRIM) 100 MG tablet Take 100 mg by mouth daily.    Historical Provider, MD  amiodarone (PACERONE) 200 MG tablet Take 1 tablet by mouth daily 03/23/15   [name], MD  cephALEXin (KEFLEX) 500 MG capsule Take 1 capsule (500 mg total) by mouth 3 (three) times daily. 07/19/15   Binnie Rail, MD  Cholecalciferol (VITAMIN D) 1000 UNITS capsule Take 1 capsule (1,000 Units total) by mouth daily. 01/05/11   Renella Cunas, MD  clonazePAM (KLONOPIN) 0.5 MG tablet Take 0.25 mg by mouth daily as needed (Take 1/2 tablet once daily as needed if you wake up early).    Historical Provider, MD  colchicine 0.6 MG tablet Take 0.6 mg by mouth daily as needed (gout flares).  04/03/11   Renella Cunas, MD  ferrous sulfate 325 (65 FE) MG tablet Take 325 mg by mouth 2 (two) times daily with a meal.    Historical Provider, MD  fish oil-omega-3 fatty acids 1000 MG capsule Take 1 g by mouth daily.      Historical Provider, MD  Flaxseed, Linseed, (FLAX SEED OIL) 1000 MG CAPS Take 1 capsule by mouth daily.     Historical Provider, MD  furosemide (LASIX) 80 MG tablet Take 80mg  in the morning and 40mg  in the  afternoon 06/10/15   [name], MD  levothyroxine (SYNTHROID, LEVOTHROID) 25 MCG tablet Take 25 mcg by mouth daily before breakfast.    Historical Provider, MD  metolazone (ZAROXOLYN) 2.5 MG tablet Take 1 tablet (2.5 mg total) by mouth daily as needed (edema). 12/22/14   [name], MD  Multiple Vitamin (MULTIVITAMIN) tablet Take 1 tablet by mouth daily.  Historical Provider, MD  omeprazole (PRILOSEC) 20 MG capsule TAKE 1 CAPSULE EVERY DAY  (REPLACES  PANTOPRAZOLE) 06/29/15   [name], MD  potassium chloride (K-DUR) 10 MEQ tablet Take 1 tablet (10 mEq total) by mouth daily. 02/04/14   [name], MD  potassium chloride (K-DUR) 10 MEQ tablet TAKE 1 TABLET EVERY DAY 07/21/15   [name], MD  propranolol (INDERAL) 10 MG tablet Take 1 tablet (10 mg total) by mouth 2 (two) times daily. 01/20/15   Binnie Rail, MD  sertraline (ZOLOFT) 50 MG tablet TAKE 1 TABLET (50 MG TOTAL) BY MOUTH AT BEDTIME. 03/29/15   Binnie Rail, MD  traMADol (ULTRAM) 50 MG tablet Take 50 mg by mouth every 8 (eight) hours as needed (pain). Reported on 06/28/2015    Historical Provider, MD  vitamin C (ASCORBIC ACID) 500 MG tablet Take 1 tablet (500 mg total) by mouth daily. 01/05/11   Renella Cunas, MD     Positive ROS: per HPI  All other systems have been reviewed and were otherwise negative with the exception of those mentioned in the HPI and as above.  Physical Exam: There were no vitals filed for this visit.  General: Alert, no acute distress Oral: Normal oral mucosa and tonsils Nasal: Clear nasal passages Neck: No palpable adenopathy or thyroid nodules Ear: Ear canal is clear with normal appearing TMs. 1 cm sore beneath the right tragus Cardiovascular: Regular rate and rhythm, no murmur.  Respiratory: Clear to auscultation Neurologic: Alert and oriented x 3   Assessment/Plan: RIGHT EAR SKIN CANCER Plan for Procedure(s): EXCISION  RIGHT EAR SKIN CANCER   Melony Overly,  MD 07/29/2015 6:06 PM

## 2015-08-02 ENCOUNTER — Encounter (HOSPITAL_BASED_OUTPATIENT_CLINIC_OR_DEPARTMENT_OTHER): Payer: Self-pay | Admitting: Anesthesiology

## 2015-08-02 ENCOUNTER — Encounter (HOSPITAL_BASED_OUTPATIENT_CLINIC_OR_DEPARTMENT_OTHER)
Admission: RE | Disposition: A | Discharge status: Home / Self Care | Payer: Self-pay | Source: Ambulatory Visit | Attending: Otolaryngology

## 2015-08-02 ENCOUNTER — Encounter (HOSPITAL_BASED_OUTPATIENT_CLINIC_OR_DEPARTMENT_OTHER): Payer: Self-pay

## 2015-08-02 ENCOUNTER — Ambulatory Visit (HOSPITAL_BASED_OUTPATIENT_CLINIC_OR_DEPARTMENT_OTHER)
Admission: RE | Admit: 2015-08-02 | Discharge: 2015-08-02 | Disposition: A | Discharge status: Home / Self Care | Payer: Commercial Managed Care - HMO | Source: Ambulatory Visit | Attending: Otolaryngology | Admitting: Otolaryngology

## 2015-08-02 DIAGNOSIS — I480 Paroxysmal atrial fibrillation: Secondary | ICD-10-CM | POA: Diagnosis not present

## 2015-08-02 DIAGNOSIS — Z8673 Personal history of transient ischemic attack (TIA), and cerebral infarction without residual deficits: Secondary | ICD-10-CM | POA: Diagnosis not present

## 2015-08-02 DIAGNOSIS — C44201 Unspecified malignant neoplasm of skin of unspecified ear and external auricular canal: Secondary | ICD-10-CM | POA: Diagnosis present

## 2015-08-02 DIAGNOSIS — N183 Chronic kidney disease, stage 3 (moderate): Secondary | ICD-10-CM | POA: Diagnosis not present

## 2015-08-02 DIAGNOSIS — I130 Hypertensive heart and chronic kidney disease with heart failure and stage 1 through stage 4 chronic kidney disease, or unspecified chronic kidney disease: Secondary | ICD-10-CM | POA: Insufficient documentation

## 2015-08-02 DIAGNOSIS — I5032 Chronic diastolic (congestive) heart failure: Secondary | ICD-10-CM | POA: Insufficient documentation

## 2015-08-02 DIAGNOSIS — Z87891 Personal history of nicotine dependence: Secondary | ICD-10-CM | POA: Insufficient documentation

## 2015-08-02 DIAGNOSIS — Z952 Presence of prosthetic heart valve: Secondary | ICD-10-CM | POA: Insufficient documentation

## 2015-08-02 DIAGNOSIS — E1122 Type 2 diabetes mellitus with diabetic chronic kidney disease: Secondary | ICD-10-CM | POA: Insufficient documentation

## 2015-08-02 DIAGNOSIS — C44212 Basal cell carcinoma of skin of right ear and external auricular canal: Secondary | ICD-10-CM | POA: Insufficient documentation

## 2015-08-02 DIAGNOSIS — E039 Hypothyroidism, unspecified: Secondary | ICD-10-CM | POA: Insufficient documentation

## 2015-08-02 HISTORY — PX: LESION REMOVAL: SHX5196

## 2015-08-02 SURGERY — MINOR EXCISION OF LESION
Anesthesia: LOCAL | Site: Ear | Laterality: Right

## 2015-08-02 MED ORDER — LIDOCAINE-EPINEPHRINE 1 %-1:100000 IJ SOLN
INTRAMUSCULAR | Status: DC | PRN
  Administered 2015-08-02: 5 mL

## 2015-08-02 MED ORDER — CHLORHEXIDINE GLUCONATE CLOTH 2 % EX PADS: 6.0000 | MEDICATED_PAD | Freq: Once | CUTANEOUS | Status: DC

## 2015-08-02 MED ORDER — CEPHALEXIN 500 MG PO CAPS: 500.0000 mg | ORAL_CAPSULE | Freq: Two times a day (BID) | ORAL | 0 refills | Status: DC

## 2015-08-02 SURGICAL SUPPLY — 53 items
APL SKNCLS STERI-STRIP NONHPOA (GAUZE/BANDAGES/DRESSINGS)
BALL CTTN LRG ABS STRL LF (GAUZE/BANDAGES/DRESSINGS)
BANDAGE ADH SHEER 1  50/CT (GAUZE/BANDAGES/DRESSINGS) IMPLANT
BENZOIN TINCTURE PRP APPL 2/3 (GAUZE/BANDAGES/DRESSINGS) IMPLANT
BLADE SURG 15 STRL LF DISP TIS (BLADE) ×1 IMPLANT
BLADE SURG 15 STRL SS (BLADE) ×2
CANISTER SUCT 1200ML W/VALVE (MISCELLANEOUS) IMPLANT
CAUTERY EYE LOW TEMP 1300F FIN (OPHTHALMIC RELATED) IMPLANT
CLEANER CAUTERY TIP 5X5 PAD (MISCELLANEOUS) IMPLANT
COTTONBALL LRG STERILE PKG (GAUZE/BANDAGES/DRESSINGS) IMPLANT
DECANTER SPIKE VIAL GLASS SM (MISCELLANEOUS) IMPLANT
DRSG TEGADERM 4X4.75 (GAUZE/BANDAGES/DRESSINGS) IMPLANT
ELECT COATED BLADE 2.86 ST (ELECTRODE) ×2 IMPLANT
ELECT REM PT RETURN 9FT ADLT (ELECTROSURGICAL) ×2
ELECTRODE REM PT RTRN 9FT ADLT (ELECTROSURGICAL) ×1 IMPLANT
GAUZE SPONGE 4X4 16PLY XRAY LF (GAUZE/BANDAGES/DRESSINGS) IMPLANT
GLOVE SS BIOGEL STRL SZ 7.5 (GLOVE) ×1 IMPLANT
GLOVE SUPERSENSE BIOGEL SZ 7.5 (GLOVE) ×1
GOWN STRL REUS W/ TWL LRG LVL3 (GOWN DISPOSABLE) IMPLANT
GOWN STRL REUS W/TWL LRG LVL3 (GOWN DISPOSABLE)
LIQUID BAND (GAUZE/BANDAGES/DRESSINGS) IMPLANT
MARKER SKIN DUAL TIP RULER LAB (MISCELLANEOUS) IMPLANT
NEEDLE PRECISIONGLIDE 27X1.5 (NEEDLE) ×2 IMPLANT
NS IRRIG 1000ML POUR BTL (IV SOLUTION) IMPLANT
PACK BASIN DAY SURGERY FS (CUSTOM PROCEDURE TRAY) IMPLANT
PAD CLEANER CAUTERY TIP 5X5 (MISCELLANEOUS)
PENCIL BUTTON HOLSTER BLD 10FT (ELECTRODE) ×2 IMPLANT
SPONGE GAUZE 4X4 12PLY STER LF (GAUZE/BANDAGES/DRESSINGS) IMPLANT
SPONGE INTESTINAL PEANUT (DISPOSABLE) IMPLANT
STRIP CLOSURE SKIN 1/2X4 (GAUZE/BANDAGES/DRESSINGS) IMPLANT
STRIP CLOSURE SKIN 1/4X4 (GAUZE/BANDAGES/DRESSINGS) IMPLANT
SUCTION FRAZIER HANDLE 10FR (MISCELLANEOUS)
SUCTION TUBE FRAZIER 10FR DISP (MISCELLANEOUS) IMPLANT
SUT CHROMIC 3 0 PS 2 (SUTURE) IMPLANT
SUT CHROMIC 4 0 P 3 18 (SUTURE) IMPLANT
SUT ETHILON 5 0 P 3 18 (SUTURE) ×1
SUT ETHILON 6 0 P 1 (SUTURE) IMPLANT
SUT NYLON ETHILON 5-0 P-3 1X18 (SUTURE) ×1 IMPLANT
SUT PLAIN 5 0 P 3 18 (SUTURE) IMPLANT
SUT SILK 4 0 TIES 17X18 (SUTURE) IMPLANT
SUT VIC AB 4-0 P-3 18XBRD (SUTURE) IMPLANT
SUT VIC AB 4-0 P3 18 (SUTURE)
SUT VIC AB 5-0 P-3 18X BRD (SUTURE) ×1 IMPLANT
SUT VIC AB 5-0 P3 18 (SUTURE) ×2
SWAB COLLECTION DEVICE MRSA (MISCELLANEOUS) IMPLANT
SWAB CULTURE ESWAB REG 1ML (MISCELLANEOUS) IMPLANT
SWABSTICK POVIDONE IODINE SNGL (MISCELLANEOUS) ×4 IMPLANT
SYR BULB 3OZ (MISCELLANEOUS) IMPLANT
SYR CONTROL 10ML LL (SYRINGE) ×2 IMPLANT
TOWEL OR 17X24 6PK STRL BLUE (TOWEL DISPOSABLE) ×2 IMPLANT
TRAY DSU PREP LF (CUSTOM PROCEDURE TRAY) IMPLANT
TUBE CONNECTING 20X1/4 (TUBING) IMPLANT
YANKAUER SUCT BULB TIP NO VENT (SUCTIONS) IMPLANT

## 2015-08-02 NOTE — Interval H&P Note (Signed)
History and Physical Interval Note:  08/02/2015 7:29 AM  [name]  has presented today for surgery, with the diagnosis of RIGHT EAR SKIN CANCER  The various methods of treatment have been discussed with the patient and family. After consideration of risks, benefits and other options for treatment, the patient has consented to  Procedure(s) with comments: EXCISION  RIGHT EAR SKIN CANCER (Right) - LOCAL as a surgical intervention .  [name] history has been reviewed, patient examined, no change in status, stable for surgery.  I have reviewed the patient's chart and labs.  Questions were answered to the patient's satisfaction.     CHRISTOPHER NEWMAN

## 2015-08-02 NOTE — Brief Op Note (Signed)
08/02/2015  10:56 AM  PATIENT:  [name]  82 y.o. male  PRE-OPERATIVE DIAGNOSIS:  RIGHT EAR SKIN CANCER  POST-OPERATIVE DIAGNOSIS:  RIGHT EAR SKIN CANCER  PROCEDURE:  Procedure(s) with comments: EXCISION  RIGHT EAR SKIN CANCER (Right) - LOCAL  SURGEON:  Surgeon(s) and Role:    * Rozetta Nunnery, MD - Primary  PHYSICIAN ASSISTANT:   ASSISTANTS: none   ANESTHESIA:   local  EBL:  No intake/output data recorded.  BLOOD ADMINISTERED:none  DRAINS: none   LOCAL MEDICATIONS USED:  XYLOCAINE with EPI 3-4 cc  SPECIMEN:  Source of Specimen:  right ear  DISPOSITION OF SPECIMEN:  PATHOLOGY  COUNTS:  YES  TOURNIQUET:  * No tourniquets in log *  DICTATION: .Other Dictation: Dictation Number [id number]  PLAN OF CARE: Discharge to home after PACU  PATIENT DISPOSITION:  PACU - hemodynamically stable.   Delay start of Pharmacological VTE agent (>24hrs) due to surgical blood loss or risk of bleeding: not applicable

## 2015-08-02 NOTE — Discharge Instructions (Signed)
Keep incision site dry for 24 hrs Apply antibiotic ointment to incision site daily Call office for follow up appt in 1 week for next Tuesday , Wednesday or Thursday      [phone]

## 2015-08-03 NOTE — Op Note (Signed)
NAME:  [name], [name] NO.:  [id number]  MEDICAL RECORD NO.:  [id number]  LOCATION:                               FACILITY:  [facility]  PHYSICIAN:  Leonides Sake. Lucia Gaskins, M.D.DATE OF BIRTH:  September 05, 1922  DATE OF PROCEDURE: DATE OF DISCHARGE:                              OPERATIVE REPORT   PREOPERATIVE DIAGNOSIS:  Chronic right ear sore consistent with probable basal cell carcinoma.  POSTOPERATIVE DIAGNOSIS:  Chronic right ear sore consistent with probable basal cell carcinoma.  OPERATION PERFORMED:  Excision of the right ear sore, 1.2 mm size with intermediate closure.  ANESTHESIA:  Via local, 1% Xylocaine with epinephrine 3-4 mL.  COMPLICATIONS:  None.  BRIEF CLINICAL NOTE:  [name] is a 82 year old gentleman who has had a chronic sore in his right ear for about a year now.  On exam, he has about 1-1.5 cm sore ulcer below his right tragus that appears to represent probable basal cell carcinoma of the skin as it has rolled edges.  He has history of chronic low platelet count.  He is taken to the operating room this time for excision of right ear sore.  DESCRIPTION OF PROCEDURE:  The patient was brought to the operating room.  The area of the sore was marked out and injected with 3-4 mL of Xylocaine with epinephrine for local anesthetic.  The sore was elliptically excised with about 2 mm gross margins.  Hemostasis was obtained with a cautery and then the small defect was closed with 5-0 Vicryl sutures subcutaneously and 5-0 nylon to reapproximate the skin edges.  Mupirocin ointment was applied.  Chevez had minimal bleeding and is discharged home later this morning on Tylenol or Motrin p.r.n. pain. Instructed to apply the mupirocin ointment daily for the next 5 days. He will follow up in my office in 1 week to review final pathology and have sutures removed.    ______________________________ Leonides Sake Lucia Gaskins,  M.D.   ______________________________ Leonides Sake. Lucia Gaskins, M.D.    CEN/MEDQ  D:  08/02/2015  T:  08/03/2015  Job:  [id number]  cc:   Billey Gosling, M.D.

## 2015-08-03 NOTE — Op Note (Deleted)
  The note originally documented on this encounter has been moved the the encounter in which it belongs.  

## 2015-08-05 ENCOUNTER — Encounter (HOSPITAL_BASED_OUTPATIENT_CLINIC_OR_DEPARTMENT_OTHER): Payer: Self-pay | Admitting: Otolaryngology

## 2015-08-09 ENCOUNTER — Other Ambulatory Visit (HOSPITAL_BASED_OUTPATIENT_CLINIC_OR_DEPARTMENT_OTHER): Payer: Commercial Managed Care - HMO

## 2015-08-09 DIAGNOSIS — D696 Thrombocytopenia, unspecified: Secondary | ICD-10-CM | POA: Diagnosis not present

## 2015-08-09 LAB — CBC WITH DIFFERENTIAL/PLATELET
BASO%: 0.3 % (ref 0.0–2.0)
BASOS ABS: 0 10*3/uL (ref 0.0–0.1)
EOS%: 1.1 % (ref 0.0–7.0)
Eosinophils Absolute: 0 10*3/uL (ref 0.0–0.5)
HCT: 32.2 % — ABNORMAL LOW (ref 38.4–49.9)
HGB: 10.8 g/dL — ABNORMAL LOW (ref 13.0–17.1)
LYMPH%: 11.8 % — AB (ref 14.0–49.0)
MCH: 29 pg (ref 27.2–33.4)
MCHC: 33.5 g/dL (ref 32.0–36.0)
MCV: 86.3 fL (ref 79.3–98.0)
MONO#: 0.3 10*3/uL (ref 0.1–0.9)
MONO%: 8 % (ref 0.0–14.0)
NEUT#: 2.9 10*3/uL (ref 1.5–6.5)
NEUT%: 78.8 % — ABNORMAL HIGH (ref 39.0–75.0)
NRBC: 0 % (ref 0–0)
PLATELETS: 29 10*3/uL — AB (ref 140–400)
RBC: 3.73 10*6/uL — AB (ref 4.20–5.82)
RDW: 17.8 % — AB (ref 11.0–14.6)
WBC: 3.6 10*3/uL — ABNORMAL LOW (ref 4.0–10.3)
lymph#: 0.4 10*3/uL — ABNORMAL LOW (ref 0.9–3.3)

## 2015-08-09 LAB — TECHNOLOGIST REVIEW

## 2015-08-10 ENCOUNTER — Other Ambulatory Visit (HOSPITAL_COMMUNITY): Payer: Self-pay

## 2015-08-10 ENCOUNTER — Other Ambulatory Visit: Payer: Self-pay

## 2015-08-10 ENCOUNTER — Ambulatory Visit (INDEPENDENT_AMBULATORY_CARE_PROVIDER_SITE_OTHER): Payer: Commercial Managed Care - HMO | Admitting: Family Medicine

## 2015-08-10 ENCOUNTER — Encounter: Payer: Self-pay | Admitting: Family Medicine

## 2015-08-10 ENCOUNTER — Encounter (HOSPITAL_COMMUNITY): Payer: Self-pay | Admitting: Emergency Medicine

## 2015-08-10 ENCOUNTER — Emergency Department (HOSPITAL_COMMUNITY): Payer: Commercial Managed Care - HMO

## 2015-08-10 ENCOUNTER — Inpatient Hospital Stay (HOSPITAL_COMMUNITY)
Admission: EM | Admit: 2015-08-10 | Discharge: 2015-08-17 | DRG: 871 | Disposition: A | Discharge status: Home / Self Care | Payer: Commercial Managed Care - HMO | Attending: Internal Medicine | Admitting: Internal Medicine

## 2015-08-10 VITALS — BP 80/50 | HR 99 | Temp 98.7°F | Ht 70.0 in | Wt 176.0 lb

## 2015-08-10 DIAGNOSIS — Z8673 Personal history of transient ischemic attack (TIA), and cerebral infarction without residual deficits: Secondary | ICD-10-CM

## 2015-08-10 DIAGNOSIS — R531 Weakness: Secondary | ICD-10-CM | POA: Diagnosis not present

## 2015-08-10 DIAGNOSIS — D479 Neoplasm of uncertain behavior of lymphoid, hematopoietic and related tissue, unspecified: Secondary | ICD-10-CM | POA: Diagnosis present

## 2015-08-10 DIAGNOSIS — K4090 Unilateral inguinal hernia, without obstruction or gangrene, not specified as recurrent: Secondary | ICD-10-CM | POA: Diagnosis present

## 2015-08-10 DIAGNOSIS — R112 Nausea with vomiting, unspecified: Secondary | ICD-10-CM

## 2015-08-10 DIAGNOSIS — Z85828 Personal history of other malignant neoplasm of skin: Secondary | ICD-10-CM

## 2015-08-10 DIAGNOSIS — Z66 Do not resuscitate: Secondary | ICD-10-CM | POA: Diagnosis present

## 2015-08-10 DIAGNOSIS — Z87891 Personal history of nicotine dependence: Secondary | ICD-10-CM

## 2015-08-10 DIAGNOSIS — R6521 Severe sepsis with septic shock: Secondary | ICD-10-CM | POA: Diagnosis not present

## 2015-08-10 DIAGNOSIS — N183 Chronic kidney disease, stage 3 (moderate): Secondary | ICD-10-CM | POA: Diagnosis present

## 2015-08-10 DIAGNOSIS — I951 Orthostatic hypotension: Secondary | ICD-10-CM | POA: Diagnosis present

## 2015-08-10 DIAGNOSIS — L03116 Cellulitis of left lower limb: Secondary | ICD-10-CM

## 2015-08-10 DIAGNOSIS — I4581 Long QT syndrome: Secondary | ICD-10-CM | POA: Diagnosis present

## 2015-08-10 DIAGNOSIS — E039 Hypothyroidism, unspecified: Secondary | ICD-10-CM | POA: Diagnosis present

## 2015-08-10 DIAGNOSIS — A419 Sepsis, unspecified organism: Principal | ICD-10-CM | POA: Diagnosis present

## 2015-08-10 DIAGNOSIS — I5032 Chronic diastolic (congestive) heart failure: Secondary | ICD-10-CM | POA: Diagnosis present

## 2015-08-10 DIAGNOSIS — D61818 Other pancytopenia: Secondary | ICD-10-CM | POA: Diagnosis present

## 2015-08-10 DIAGNOSIS — Z79899 Other long term (current) drug therapy: Secondary | ICD-10-CM

## 2015-08-10 DIAGNOSIS — W57XXXA Bitten or stung by nonvenomous insect and other nonvenomous arthropods, initial encounter: Secondary | ICD-10-CM | POA: Diagnosis present

## 2015-08-10 DIAGNOSIS — I959 Hypotension, unspecified: Secondary | ICD-10-CM

## 2015-08-10 DIAGNOSIS — L0390 Cellulitis, unspecified: Secondary | ICD-10-CM

## 2015-08-10 DIAGNOSIS — Z888 Allergy status to other drugs, medicaments and biological substances status: Secondary | ICD-10-CM

## 2015-08-10 DIAGNOSIS — E875 Hyperkalemia: Secondary | ICD-10-CM | POA: Diagnosis not present

## 2015-08-10 DIAGNOSIS — E1122 Type 2 diabetes mellitus with diabetic chronic kidney disease: Secondary | ICD-10-CM | POA: Diagnosis present

## 2015-08-10 DIAGNOSIS — I480 Paroxysmal atrial fibrillation: Secondary | ICD-10-CM | POA: Diagnosis present

## 2015-08-10 DIAGNOSIS — L03115 Cellulitis of right lower limb: Secondary | ICD-10-CM | POA: Diagnosis present

## 2015-08-10 DIAGNOSIS — I130 Hypertensive heart and chronic kidney disease with heart failure and stage 1 through stage 4 chronic kidney disease, or unspecified chronic kidney disease: Secondary | ICD-10-CM | POA: Diagnosis present

## 2015-08-10 DIAGNOSIS — Z880 Allergy status to penicillin: Secondary | ICD-10-CM

## 2015-08-10 DIAGNOSIS — Z952 Presence of prosthetic heart valve: Secondary | ICD-10-CM

## 2015-08-10 DIAGNOSIS — K7200 Acute and subacute hepatic failure without coma: Secondary | ICD-10-CM | POA: Diagnosis present

## 2015-08-10 LAB — I-STAT CHEM 8, ED
BUN: 79 mg/dL — ABNORMAL HIGH (ref 6–20)
CHLORIDE: 104 mmol/L (ref 101–111)
Calcium, Ion: 1.09 mmol/L — ABNORMAL LOW (ref 1.12–1.23)
Creatinine, Ser: 1.9 mg/dL — ABNORMAL HIGH (ref 0.61–1.24)
GLUCOSE: 188 mg/dL — AB (ref 65–99)
HCT: 32 % — ABNORMAL LOW (ref 39.0–52.0)
Hemoglobin: 10.9 g/dL — ABNORMAL LOW (ref 13.0–17.0)
POTASSIUM: 4.4 mmol/L (ref 3.5–5.1)
Sodium: 136 mmol/L (ref 135–145)
TCO2: 19 mmol/L (ref 0–100)

## 2015-08-10 LAB — COMPREHENSIVE METABOLIC PANEL
ALBUMIN: 3.5 g/dL (ref 3.5–5.0)
ALT: 80 U/L — ABNORMAL HIGH (ref 17–63)
ANION GAP: 10 (ref 5–15)
AST: 88 U/L — ABNORMAL HIGH (ref 15–41)
Alkaline Phosphatase: 108 U/L (ref 38–126)
BILIRUBIN TOTAL: 1.3 mg/dL — AB (ref 0.3–1.2)
BUN: 81 mg/dL — ABNORMAL HIGH (ref 6–20)
CO2: 18 mmol/L — AB (ref 22–32)
Calcium: 8.7 mg/dL — ABNORMAL LOW (ref 8.9–10.3)
Chloride: 107 mmol/L (ref 101–111)
Creatinine, Ser: 1.93 mg/dL — ABNORMAL HIGH (ref 0.61–1.24)
GFR calc Af Amer: 33 mL/min — ABNORMAL LOW (ref >60)
GFR calc non Af Amer: 29 mL/min — ABNORMAL LOW (ref >60)
GLUCOSE: 196 mg/dL — AB (ref 65–99)
POTASSIUM: 4.3 mmol/L (ref 3.5–5.1)
SODIUM: 135 mmol/L (ref 135–145)
TOTAL PROTEIN: 5.7 g/dL — AB (ref 6.5–8.1)

## 2015-08-10 LAB — URINALYSIS, ROUTINE W REFLEX MICROSCOPIC
Bilirubin Urine: NEGATIVE
Glucose, UA: NEGATIVE mg/dL
HGB URINE DIPSTICK: NEGATIVE
Ketones, ur: NEGATIVE mg/dL
LEUKOCYTES UA: NEGATIVE
NITRITE: NEGATIVE
Protein, ur: NEGATIVE mg/dL
SPECIFIC GRAVITY, URINE: 1.018 (ref 1.005–1.030)
pH: 5 (ref 5.0–8.0)

## 2015-08-10 LAB — CBC WITH DIFFERENTIAL/PLATELET
BASOS PCT: 0 %
Basophils Absolute: 0 10*3/uL (ref 0.0–0.1)
EOS ABS: 0 10*3/uL (ref 0.0–0.7)
EOS PCT: 0 %
HCT: 31.8 % — ABNORMAL LOW (ref 39.0–52.0)
Hemoglobin: 10.5 g/dL — ABNORMAL LOW (ref 13.0–17.0)
Lymphocytes Relative: 5 %
Lymphs Abs: 0.4 10*3/uL — ABNORMAL LOW (ref 0.7–4.0)
MCH: 28.7 pg (ref 26.0–34.0)
MCHC: 33 g/dL (ref 30.0–36.0)
MCV: 86.9 fL (ref 78.0–100.0)
MONO ABS: 0.4 10*3/uL (ref 0.1–1.0)
MONOS PCT: 6 %
Neutro Abs: 6.4 10*3/uL (ref 1.7–7.7)
Neutrophils Relative %: 89 %
Platelets: 22 10*3/uL — CL (ref 150–400)
RBC: 3.66 MIL/uL — ABNORMAL LOW (ref 4.22–5.81)
RDW: 17.5 % — AB (ref 11.5–15.5)
WBC: 7.2 10*3/uL (ref 4.0–10.5)

## 2015-08-10 LAB — I-STAT CG4 LACTIC ACID, ED
LACTIC ACID, VENOUS: 1.99 mmol/L — AB (ref 0.5–1.9)
LACTIC ACID, VENOUS: 1.56 mmol/L (ref 0.5–1.9)

## 2015-08-10 MED ORDER — CLINDAMYCIN PHOSPHATE 900 MG/50ML IV SOLN
900.0000 mg | Freq: Once | INTRAVENOUS | Status: AC
  Administered 2015-08-10: 900 mg via INTRAVENOUS
  Filled 2015-08-10: qty 50

## 2015-08-10 MED ORDER — SODIUM CHLORIDE 0.9 % IV BOLUS (SEPSIS)
1000.0000 mL | Freq: Once | INTRAVENOUS | Status: AC
  Administered 2015-08-10: 1000 mL via INTRAVENOUS

## 2015-08-10 MED ORDER — SODIUM CHLORIDE 0.9 % IV BOLUS (SEPSIS)
1500.0000 mL | Freq: Once | INTRAVENOUS | Status: AC
  Administered 2015-08-10: 1500 mL via INTRAVENOUS

## 2015-08-10 MED ORDER — NOREPINEPHRINE BITARTRATE 1 MG/ML IV SOLN
2.0000 ug/min | INTRAVENOUS | Status: DC
  Administered 2015-08-10: 2 ug/min via INTRAVENOUS
  Filled 2015-08-10: qty 4

## 2015-08-10 NOTE — ED Notes (Signed)
edp at bedside  

## 2015-08-10 NOTE — ED Notes (Signed)
PLT result reported to Dr. Tyrone Nine

## 2015-08-10 NOTE — ED Notes (Signed)
Pt states that he does not want any pain medication.

## 2015-08-10 NOTE — ED Provider Notes (Addendum)
[facility] DEPT Provider Note   CSN: [id number] Arrival date & time: 08/10/15  1933  First Provider Contact:  First MD Initiated Contact with Patient 08/10/15 1945        History   Chief Complaint Chief Complaint  Patient presents with  . Leg Pain    HPI [name] is a 82 y.o. male.  82 yo M with a chief complaint of left lower extremity A and swelling. Going on for the past couple days. Patient had an injury to the area about a week ago. Now having subjective fevers and chills. Went to see his family physician who thought he likely had cellulitis. Patient was orthostatic on arrival there at blood pressures in the 80s. Sent to the ED for evaluation.   The history is provided by the patient.  Leg Pain   This is a new problem. The current episode started 2 days ago. The problem occurs constantly. The problem has not changed since onset.The quality of the pain is described as dull. The pain is at a severity of 7/10. The pain is moderate. He has tried nothing for the symptoms. The treatment provided no relief. There has been a history of trauma (bumped his leg earlier this week).    Past Medical History:  Diagnosis Date  . Action tremor   . Anxiety   . Aortic stenosis    a. Previously severe -> s/p minimally invasive tissue AVR with Dr. Evelina Dun at [facility] 01/2011 (pre-AVR cath with no obs CAD);  b. 05/2014 s/p TAVR (23 mm Edwards Sapien 3).  . Bacteremia    a. 12/2012 - S bovis;  b. 12/2012 TEE w/o veg;  c. Seeing ID->Rocephin therapy extended to 01/25/2013 via PICC for possible endocarditis (No veg on TEE).  . Bradycardia   . Cellulitis of left leg 10/11-16/2012  . Chronic diastolic CHF (congestive heart failure) ([place])    a. 12/15/2012 TEE: EF 60-65%, no veg.  . CKD (chronic kidney disease), stage III   . Colon polyp   . Degenerative disc disease   . Diabetes mellitus    "borderline" (01/05/2013)  . Diverticulosis   . GERD (gastroesophageal reflux disease)   . Gout   .  H/O hiatal hernia   . High cholesterol   . History of blood transfusion 01/2011; 11/2012  . Hypertension   . Hypotension   . Hypothyroidism   . Joint effusion, knee    left knee  . Leukopenia    Chronic pancytopenia  . PAF (paroxysmal atrial fibrillation) (HCC)    a. amiodarone therapy; not felt to be a candidate for anticoagulation with pancytopenia. b. Back in AF 09/2014 - decision was made to pursue rate control only.  . Pancytopenia ([place])    a. possible chronic lymphoproliferative disorder or splenic lymphoma.  . QT prolongation   . S/P TAVR (transcatheter aortic valve replacement)    a. 05/14/2014 TAVR: 23 mm Edwards Sapien 3 transcatheter heart valve placed valve-in-valve for prosthetic valve dysfunction via open right transfemoral approach  . Splenomegaly   . Stroke ([place])   . Synovial cyst of popliteal space   . Throat cancer ([place])    s/p lasered  . Thrombocytopenia ([place])    Dr. Benay Spice    Patient Active Problem List   Diagnosis Date Noted  . Cellulitis of leg, right 07/19/2015  . Ear pain 06/29/2015  . Sepsis due to Escherichia coli ([place])   . UTI (lower urinary tract infection) 05/18/2015  . Hypotension 05/18/2015  .  Acute on chronic diastolic CHF (congestive heart failure) ([place]) 05/18/2015  . Septic shock ([place])   . Sepsis ([place]) 05/09/2015  . Hypervolemia 05/09/2015  . Cellulitis of left upper extremity 03/11/2015  . QT prolongation   . Generalized weakness 10/04/2014  . Chest discomfort 10/04/2014  . GERD (gastroesophageal reflux disease) 10/04/2014  . Chronic diastolic congestive heart failure ([place]) 10/04/2014  . Impingement syndrome of right shoulder region 05/21/2014  . Severe aortic stenosis 05/14/2014  . S/P TAVR (transcatheter aortic valve replacement) 05/14/2014  . Severe aortic insufficiency 04/05/2014  . Depression 10/02/2013  . Cellulitis of leg, left 08/01/2013  . Aortic atherosclerosis ([place]) 01/06/2013  . PAF (paroxysmal atrial fibrillation) ([place])  01/06/2013  . Lower extremity edema 01/06/2013  . CKD (chronic kidney disease), stage III 01/06/2013  . Bacteremia 12/30/2012  . S/P AVR 12/13/2012  . Protein-calorie malnutrition, severe ([place]) 12/13/2012  . Sinus bradycardia 10/04/2011  . Hypothyroidism 02/27/2011  . Restless legs 02/27/2011  . Other pancytopenia ([place]) 12/06/2009  . HYPERURICEMIA, ASYMPTOMATIC 07/25/2009  . HYPERGLYCEMIA, FASTING 01/05/2009  . CAD, NATIVE VESSEL 12/21/2008  . Diabetes mellitus ([place]) 10/06/2008  . Aortic valve disorder 05/15/2008  . ACTION TREMOR 01/14/2007  . POPLITEAL CYST, RIGHT 11/05/2006  . Gout 05/13/2006  . Thrombocytopenia ([place]) 05/13/2006  . ANXIETY 05/13/2006  . Essential hypertension 05/13/2006  . CVA 05/13/2006  . [place] DISEASE 05/13/2006  . Splenomegaly 05/13/2006    Past Surgical History:  Procedure Laterality Date  . AORTIC VALVE REPLACEMENT  01/23/2011   via minimally invasive approach per Dr Evelina Dun, [facility]  . CARDIAC CATHETERIZATION    . CARDIAC VALVE REPLACEMENT    . CHOLECYSTECTOMY    . EXCISIONAL HEMORRHOIDECTOMY    . INGUINAL HERNIA REPAIR Right   . joint effusion     left knee  . LESION REMOVAL Right 08/02/2015   Procedure: EXCISION  RIGHT EAR SKIN CANCER;  Surgeon: Rozetta Nunnery, MD;  Location: [facility];  Service: ENT;  Laterality: Right;  LOCAL  . 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 SURGERY SCROTAL / TESTICULAR     "removed one" (01/05/2013)  . TEE WITHOUT CARDIOVERSION N/A 12/15/2012   Procedure: TRANSESOPHAGEAL ECHOCARDIOGRAM (TEE);  Surgeon: [name], MD;  Location: [facility];  Service: Cardiovascular;  Laterality: N/A;  . TEE WITHOUT CARDIOVERSION N/A 03/26/2014   Procedure: TRANSESOPHAGEAL ECHOCARDIOGRAM (TEE);  Surgeon: Josue Hector, MD;  Location: [facility];   Service: Cardiovascular;  Laterality: N/A;  . TEE WITHOUT CARDIOVERSION N/A 05/14/2014   Procedure: TRANSESOPHAGEAL ECHOCARDIOGRAM (TEE);  Surgeon: [name], MD;  Location: [facility];  Service: Open Heart Surgery;  Laterality: N/A;  . TRANSCATHETER AORTIC VALVE REPLACEMENT, TRANSFEMORAL Right 05/14/2014   Procedure: TRANSCATHETER AORTIC VALVE REPLACEMENT, TRANSFEMORAL;  Surgeon: [name], MD;  Location: [facility];  Service: Open Heart Surgery;  Laterality: Right;       Home Medications    Prior to Admission medications   Medication Sig Start Date End Date Taking? Authorizing Provider  allopurinol (ZYLOPRIM) 100 MG tablet Take 100 mg by mouth daily.   Yes Historical Provider, MD  amiodarone (PACERONE) 200 MG tablet Take 1 tablet by mouth daily 03/23/15  Yes [name], MD  Cholecalciferol (VITAMIN D) 1000 UNITS capsule Take 1 capsule (1,000 Units total) by mouth daily. 01/05/11  Yes Marijo Conception  Wall, MD  clonazePAM (KLONOPIN) 0.5 MG tablet Take 0.25 mg by mouth daily as needed (Take 1/2 tablet once daily as needed if you wake up early).   Yes Historical Provider, MD  colchicine 0.6 MG tablet Take 0.6 mg by mouth daily as needed (gout flares).  04/03/11  Yes Renella Cunas, MD  ferrous sulfate 325 (65 FE) MG tablet Take 325 mg by mouth 2 (two) times daily with a meal.   Yes Historical Provider, MD  fish oil-omega-3 fatty acids 1000 MG capsule Take 1 g by mouth daily.     Yes Historical Provider, MD  Flaxseed, Linseed, (FLAX SEED OIL) 1000 MG CAPS Take 1 capsule by mouth daily.    Yes Historical Provider, MD  furosemide (LASIX) 80 MG tablet Take 80mg  in the morning and 40mg  in the afternoon 06/10/15  Yes [name], MD  levothyroxine (SYNTHROID, LEVOTHROID) 25 MCG tablet Take 25 mcg by mouth daily before breakfast.   Yes Historical Provider, MD  metolazone (ZAROXOLYN) 2.5 MG tablet Take 1 tablet (2.5 mg total) by mouth daily as needed (edema). 12/22/14  Yes [name], MD  Multiple Vitamin  (MULTIVITAMIN) tablet Take 1 tablet by mouth daily.     Yes Historical Provider, MD  omeprazole (PRILOSEC) 20 MG capsule TAKE 1 CAPSULE EVERY DAY  (REPLACES  PANTOPRAZOLE) 06/29/15  Yes [name], MD  potassium chloride (K-DUR) 10 MEQ tablet Take 1 tablet (10 mEq total) by mouth daily. 02/04/14  Yes [name], MD  propranolol (INDERAL) 10 MG tablet Take 1 tablet (10 mg total) by mouth 2 (two) times daily. 01/20/15  Yes Binnie Rail, MD  sertraline (ZOLOFT) 50 MG tablet TAKE 1 TABLET (50 MG TOTAL) BY MOUTH AT BEDTIME. 03/29/15  Yes Binnie Rail, MD  traMADol (ULTRAM) 50 MG tablet Take 50 mg by mouth every 8 (eight) hours as needed (pain). Reported on 06/28/2015   Yes Historical Provider, MD  vitamin C (ASCORBIC ACID) 500 MG tablet Take 1 tablet (500 mg total) by mouth daily. 01/05/11  Yes Renella Cunas, MD  potassium chloride (K-DUR) 10 MEQ tablet TAKE 1 TABLET EVERY DAY Patient not taking: Reported on 08/10/2015 07/21/15   [name], MD    Family History Family History  Problem Relation Age of Onset  . Lung cancer Mother   . Heart disease Father   . Colon cancer Other   . Stroke Other   . Cancer Brother     Social History Social History  Substance Use Topics  . Smoking status: Former Smoker    Packs/day: 0.75    Years: 24.00    Types: Cigarettes    Quit date: 01/11/1962  . Smokeless tobacco: Never Used  . Alcohol use No     Allergies   Penicillins and Neomycin-bacitracin zn-polymyx   Review of Systems Review of Systems  Constitutional: Negative for chills and fever.  HENT: Negative for congestion and facial swelling.   Eyes: Negative for discharge and visual disturbance.  Respiratory: Negative for shortness of breath.   Cardiovascular: Negative for chest pain and palpitations.  Gastrointestinal: Positive for nausea and vomiting. Negative for abdominal pain and diarrhea.  Musculoskeletal: Negative for arthralgias and myalgias.  Skin: Positive for color change and  wound. Negative for rash.  Neurological: Negative for tremors, syncope and headaches.  Psychiatric/Behavioral: Negative for confusion and dysphoric mood.     Physical Exam Updated Vital Signs BP 95/55   Pulse 82   Temp 99.9 F (37.7 C) (Rectal)  Resp 15   Ht 5\' 11"  (1.803 m)   Wt 167 lb (75.8 kg)   SpO2 100%   BMI 23.29 kg/m   Physical Exam  Constitutional: He is oriented to person, place, and time. He appears well-developed and well-nourished.  HENT:  Head: Normocephalic and atraumatic.  Eyes: Conjunctivae and EOM are normal. Pupils are equal, round, and reactive to light.  Neck: Normal range of motion. No JVD present.  Cardiovascular: Normal rate and regular rhythm.   Pulmonary/Chest: Effort normal. No stridor. No respiratory distress.  Abdominal: He exhibits no distension. There is no tenderness. There is no guarding.  Musculoskeletal: Normal range of motion. He exhibits edema (4+ pitting edema to the LLE with erythema and warmth).  Neurological: He is alert and oriented to person, place, and time.  Skin: Skin is warm and dry.  Psychiatric: He has a normal mood and affect. His behavior is normal.     ED Treatments / Results  Labs (all labs ordered are listed, but only abnormal results are displayed) Labs Reviewed  CBC WITH DIFFERENTIAL/PLATELET - Abnormal; Notable for the following:       Result Value   RBC 3.66 (*)    Hemoglobin 10.5 (*)    HCT 31.8 (*)    RDW 17.5 (*)    Platelets 22 (*)    Lymphs Abs 0.4 (*)    All other components within normal limits  COMPREHENSIVE METABOLIC PANEL - Abnormal; Notable for the following:    CO2 18 (*)    Glucose, Bld 196 (*)    BUN 81 (*)    Creatinine, Ser 1.93 (*)    Calcium 8.7 (*)    Total Protein 5.7 (*)    AST 88 (*)    ALT 80 (*)    Total Bilirubin 1.3 (*)    GFR calc non Af Amer 29 (*)    GFR calc Af Amer 33 (*)    All other components within normal limits  I-STAT CHEM 8, ED - Abnormal; Notable for the  following:    BUN 79 (*)    Creatinine, Ser 1.90 (*)    Glucose, Bld 188 (*)    Calcium, Ion 1.09 (*)    Hemoglobin 10.9 (*)    HCT 32.0 (*)    All other components within normal limits  I-STAT CG4 LACTIC ACID, ED - Abnormal; Notable for the following:    Lactic Acid, Venous 1.99 (*)    All other components within normal limits  CULTURE, BLOOD (ROUTINE X 2)  CULTURE, BLOOD (ROUTINE X 2)  URINALYSIS, ROUTINE W REFLEX MICROSCOPIC (NOT AT [facility])  I-STAT CG4 LACTIC ACID, ED    EKG  EKG Interpretation None       Radiology Dg Chest Port 1 View  Result Date: 08/10/2015 CLINICAL DATA:  Possible infection. EXAM: PORTABLE CHEST 1 VIEW COMPARISON:  Radiographs of May 17, 2015. FINDINGS: Mild cardiomegaly is noted. No pneumothorax or pleural effusion is noted. Both lungs are clear. The visualized skeletal structures are unremarkable. IMPRESSION: No acute cardiopulmonary abnormality seen. Electronically Signed   By: Marijo Conception, M.D.   On: 08/10/2015 20:24    Procedures Procedures (including critical care time)   EMERGENCY DEPARTMENT [place] CARDIAC EXAM "Study: Limited Ultrasound of the heart and pericardium"  INDICATIONS:Hypotension Multiple views of the heart and pericardium are obtained with a multi-frequency probe.  PERFORMED [id number]  IMAGES ARCHIVED?: Yes  FINDINGS: No pericardial effusion, Decreased contractility, IVC dilated and Tamponade physiology absent  LIMITATIONS:  Body habitus  VIEWS USED: Subcostal 4 chamber and Inferior Vena Cava  INTERPRETATION: Cardiac activity present, Pericardial effusioin absent, Cardiac tamponade absent, Volume status normal and Decreased contractility  COMMENT:  Persistent hypotension dispite 30cc/kg of iv fluids.   Medications Ordered in ED Medications  norepinephrine (LEVOPHED) 4 mg in dextrose 5 % 250 mL (0.016 mg/mL) infusion (2 mcg/min Intravenous New Bag/Given 08/10/15 2225)  clindamycin (CLEOCIN) IVPB 900 mg (0 mg Intravenous  Stopped 08/10/15 2031)  sodium chloride 0.9 % bolus 1,000 mL (0 mLs Intravenous Stopped 08/10/15 2200)  sodium chloride 0.9 % bolus 1,500 mL (0 mLs Intravenous Stopped 08/10/15 2201)     Initial Impression / Assessment and Plan / ED Course  I have reviewed the triage vital signs and the nursing notes.  Pertinent labs & imaging results that were available during my care of the patient were reviewed by me and considered in my medical decision making (see chart for details).  Clinical Course    82 yo M With a chief complaint of left lower extremity pain and swelling. Appears to be cellulitis on my exam with erythema and warmth. Patient having subjective fevers and chills as well as soft blood pressures. Good sepsis called with a systolic BP less than 90. Given 30 mL/kg of IV fluids started on clindamycin. Patient with persistent soft blood pressures was above 65. patient asymptomatic with this. Bedside ultrasound with no ivc variation. Started on [place].   Discussed with Crit care, admit.   CRITICAL CARE Performed by: Cecilio Asper   Total critical care time: 76 minutes  Critical care time was exclusive of separately billable procedures and treating other patients.  Critical care was necessary to treat or prevent imminent or life-threatening deterioration.  Critical care was time spent personally by me on the following activities: development of treatment plan with patient and/or surrogate as well as nursing, discussions with consultants, evaluation of patient's response to treatment, examination of patient, obtaining history from patient or surrogate, ordering and performing treatments and interventions, ordering and review of laboratory studies, ordering and review of radiographic studies, pulse oximetry and re-evaluation of patient's condition.   Final Clinical Impressions(s) / ED Diagnoses   Final diagnoses:  Cellulitis of left lower extremity  Septic shock [facility])    New  Prescriptions New Prescriptions   No medications on file     Deno Etienne, DO 08/10/15 [place], DO 08/10/15 2259

## 2015-08-10 NOTE — ED Notes (Signed)
Intensivist at bedside.

## 2015-08-10 NOTE — ED Triage Notes (Signed)
Pt brought in by [facility] from doctors office. Pt c/o of L leg pain, N/V. Left leg noted to be red, warm to touch, and painful.

## 2015-08-10 NOTE — Progress Notes (Signed)
Subjective:     Patient ID: [name], male   DOB: 04-01-1922, 82 y.o.   MRN: [id number]  HPI Patient seen as a work in to our clinic. His usual primary is Dr. Celso Amy at the [facility] clinic.  He is seen with chief complaint of chills, recurrent nausea/vomiting, and erythema and pain left leg  Patient states that he was on riding mower  Monday and recalls an abrasion to the left leg from a branch. He did not notice any redness of the left lower leg until yesterday. He is a poor historian and has very difficult time hearing. He apparently developed some severe chills earlier today but temperature not taken. He states he had 4-5 episodes of vomiting earlier today including on way to our office. No diarrhea. No abdominal pain. No confusion. Has been slightly lightheaded and generalized weakness.. No syncope. Denies cough. No abdominal pain Occasional mild burning with urination.  Was seen by his primary July 11 and has some early cellulitis right leg and was treated with Keflex and that apparently improved with antibiotic  Multiple chronic problems including history of CAD, chronic diastolic heart failure, hypertension, history of atrial fibrillation, aortic stenosis, hypothyroidism, chronic kidney disease, GERD.  He has apparently had past history of sepsis from Escherichia coli and recurrent acute pyelonephritis. He lives alone and presented to our clinic alone.  Past Medical History:  Diagnosis Date  . Action tremor   . Anxiety   . Aortic stenosis    a. Previously severe -> s/p minimally invasive tissue AVR with Dr. Evelina Dun at [facility] 01/2011 (pre-AVR cath with no obs CAD);  b. 05/2014 s/p TAVR (23 mm Edwards Sapien 3).  . Bacteremia    a. 12/2012 - S bovis;  b. 12/2012 TEE w/o veg;  c. Seeing ID->Rocephin therapy extended to 01/25/2013 via PICC for possible endocarditis (No veg on TEE).  . Bradycardia   . Cellulitis of left leg 10/11-16/2012  . Chronic diastolic CHF (congestive heart failure)  ([place])    a. 12/15/2012 TEE: EF 60-65%, no veg.  . CKD (chronic kidney disease), stage III   . Colon polyp   . Degenerative disc disease   . Diabetes mellitus    "borderline" (01/05/2013)  . Diverticulosis   . GERD (gastroesophageal reflux disease)   . Gout   . H/O hiatal hernia   . High cholesterol   . History of blood transfusion 01/2011; 11/2012  . Hypertension   . Hypotension   . Hypothyroidism   . Joint effusion, knee    left knee  . Leukopenia    Chronic pancytopenia  . PAF (paroxysmal atrial fibrillation) (HCC)    a. amiodarone therapy; not felt to be a candidate for anticoagulation with pancytopenia. b. Back in AF 09/2014 - decision was made to pursue rate control only.  . Pancytopenia ([place])    a. possible chronic lymphoproliferative disorder or splenic lymphoma.  . QT prolongation   . S/P TAVR (transcatheter aortic valve replacement)    a. 05/14/2014 TAVR: 23 mm Edwards Sapien 3 transcatheter heart valve placed valve-in-valve for prosthetic valve dysfunction via open right transfemoral approach  . Splenomegaly   . Stroke ([place])   . Synovial cyst of popliteal space   . Throat cancer ([place])    s/p lasered  . Thrombocytopenia (HCC)    Dr. Benay Spice   Past Surgical History:  Procedure Laterality Date  . AORTIC VALVE REPLACEMENT  01/23/2011   via minimally invasive approach per Dr Evelina Dun, [facility]  .  CARDIAC CATHETERIZATION    . CARDIAC VALVE REPLACEMENT    . CHOLECYSTECTOMY    . EXCISIONAL HEMORRHOIDECTOMY    . INGUINAL HERNIA REPAIR Right   . joint effusion     left knee  . LESION REMOVAL Right 08/02/2015   Procedure: EXCISION  RIGHT EAR SKIN CANCER;  Surgeon: Rozetta Nunnery, MD;  Location: [place];  Service: ENT;  Laterality: Right;  LOCAL  . 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 SURGERY SCROTAL /  TESTICULAR     "removed one" (01/05/2013)  . TEE WITHOUT CARDIOVERSION N/A 12/15/2012   Procedure: TRANSESOPHAGEAL ECHOCARDIOGRAM (TEE);  Surgeon: [name], MD;  Location: [place];  Service: Cardiovascular;  Laterality: N/A;  . TEE WITHOUT CARDIOVERSION N/A 03/26/2014   Procedure: TRANSESOPHAGEAL ECHOCARDIOGRAM (TEE);  Surgeon: Josue Hector, MD;  Location: [place];  Service: Cardiovascular;  Laterality: N/A;  . TEE WITHOUT CARDIOVERSION N/A 05/14/2014   Procedure: TRANSESOPHAGEAL ECHOCARDIOGRAM (TEE);  Surgeon: [name], MD;  Location: [place];  Service: Open Heart Surgery;  Laterality: N/A;  . TRANSCATHETER AORTIC VALVE REPLACEMENT, TRANSFEMORAL Right 05/14/2014   Procedure: TRANSCATHETER AORTIC VALVE REPLACEMENT, TRANSFEMORAL;  Surgeon: [name], MD;  Location: [place];  Service: Open Heart Surgery;  Laterality: Right;    reports that he quit smoking about 53 years ago. His smoking use included Cigarettes. He has a 18.00 pack-year smoking history. He has never used smokeless tobacco. He reports that he does not drink alcohol or use drugs. family history includes Cancer in his brother; Colon cancer in his other; Heart disease in his father; Lung cancer in his mother; Stroke in his other. Allergies  Allergen Reactions  . Penicillins Other (See Comments)    Does not remember reaction (~year 1950)  . Neomycin-Bacitracin Zn-Polymyx Other (See Comments)    ? Reaction (thinks he remembers redness)     Review of Systems  Constitutional: Positive for chills and fatigue.  Respiratory: Negative for cough and shortness of breath.   Cardiovascular: Positive for leg swelling. Negative for chest pain.  Gastrointestinal: Positive for nausea and vomiting. Negative for abdominal pain and diarrhea.  Genitourinary: Negative for difficulty urinating.  Neurological: Positive for weakness. Negative for syncope.  Psychiatric/Behavioral: Negative for confusion.       Objective:    Physical Exam  Constitutional:  Alert elderly gentleman sitting in a wheelchair in no acute distress. He is very hard of hearing  HENT:  Oropharynx is dry but otherwise clear  Neck: Neck supple.  Cardiovascular: Regular rhythm.   Murmur heard. Borderline tachycardia with heart rate between 96 and 100  Pulmonary/Chest: Effort normal and breath sounds normal. No respiratory distress. He has no wheezes. He has no rales.  Abdominal: Soft. There is no tenderness.  Neurological: He is alert.  Skin:  Patient has extensive erythema left lower extremity involving most of the leg.   Very warm to touch and with moderate edema. Minimally tender       Assessment:     Patient presents with cellulitis left lower leg, 1 day history of recurrent nausea and vomiting and hypotension with standing blood pressure 80/50  I'm concerned he may be septic. Most obvious source would be left leg but also history of recurrent UTI.  He is very weak and I feel incapable of driving home or  managing as outpatient    Plan:     -We have advised transport to hospital by EMS. Patient was initially reluctant but then consented -We have notified his daughter of our plans. All of his children are currently out of town  Eulas Post MD [facility]

## 2015-08-10 NOTE — ED Notes (Signed)
Code Sepsis called to [facility] @ 1954

## 2015-08-10 NOTE — H&P (Signed)
PULMONARY / CRITICAL CARE MEDICINE   Name: KAIYON BIRKE MRN: [id number] DOB: [birth year]    ADMISSION DATE:  08/10/2015 CONSULTATION DATE:  08/10/2015  REFERRING MD:  Dr. Tyrone Nine EDP  CHIEF COMPLAINT:  LLE edema  HISTORY OF PRESENT ILLNESS:   83 year old male with PMH as below, which is significant for AS s/p TAVR 2016, dCHF, CKD III, DM, lymphoproliferative disorder with severe pancytopenia followed by Dr. Benay Spice and PAF not on anticoagulation. He was recently admitted for UTI/pyelonephritis requiring ICU, peripheral vasopressor, and broad spectrum antibiotics. Culture grew E.coli and he was eventually discharged on Omnicef for 2 weeks total ABX. He recently developed cellulitis of R leg, which was treated as an outpatient. 7/25 he was taken electively to OR for excision of R ear lesion, sutures remain in place.   7/29 he was mowing his lawn when he developed acute onset L leg pain. He thinks that maybe a branch hit it, however, he was wearing TED stocking at that time. LLE pain and edema have been progressive since that time, more recently erythema had developed. 8/2 he had some chills and several episodes of vomiting. He presented to PCP for these complaints. Based on his symptoms and appearance of his leg, with new onset hypotension and orthostasis he was referred to ED with concerns of sepsis.   In the emergency department LLE pain persisted and hypotension was confirmed. He remained hypotensive despite 30cc/kg IVF bolus. He was started on norepinephrine for BP support and PCCM called for admission. Pertinent labs include CO2 18, SCr 1.93 (close to baseline), AST/ALT mildly elevated, Lactic acid 1.9, glucose 196.  PAST MEDICAL HISTORY :  He  has a past medical history of Action tremor; Anxiety; Aortic stenosis; Bacteremia; Bradycardia; Cellulitis of left leg (10/11-16/2012); Chronic diastolic CHF (congestive heart failure) ([place]); CKD (chronic kidney disease), stage III; Colon polyp;  Degenerative disc disease; Diabetes mellitus; Diverticulosis; GERD (gastroesophageal reflux disease); Gout; H/O hiatal hernia; High cholesterol; History of blood transfusion (01/2011; 11/2012); Hypertension; Hypotension; Hypothyroidism; Joint effusion, knee; Leukopenia; PAF (paroxysmal atrial fibrillation) ([place]); Pancytopenia ([place]); QT prolongation; S/P TAVR (transcatheter aortic valve replacement); Splenomegaly; Stroke [facility]); Synovial cyst of popliteal space; Throat cancer ([place]); and Thrombocytopenia ([place]).  PAST SURGICAL HISTORY: He  has a past surgical history that includes Cholecystectomy; joint effusion; Aortic valve replacement (01/23/2011); TEE without cardioversion (N/A, 12/15/2012); Cardiac valve replacement; Inguinal hernia repair (Right); Excisional hemorrhoidectomy; Cardiac catheterization; Surgery scrotal / testicular; Microlaryngoscopy with co2 laser and excision of vocal cord lesion (1980's); TEE without cardioversion (N/A, 03/26/2014); Transcatheter aortic valve replacement, transfemoral (Right, 05/14/2014); TEE without cardioversion (N/A, 05/14/2014); and Lesion removal (Right, 08/02/2015).  Allergies  Allergen Reactions  . Penicillins Other (See Comments)    Does not remember reaction (~year 1950) Has patient had a PCN reaction causing immediate rash, facial/tongue/throat swelling, SOB or lightheadedness with hypotension:YES Has patient had a PCN reaction causing severe rash involving mucus membranes or skin necrosis: NO Has patient had a PCN reaction that required hospitalization NO Has patient had a PCN reaction occurring within the last 10 years: NO If all of the above answers are "NO", then may proceed with Cephalosporin use.  [name] Kitchen Neomycin-Bacitracin Zn-Polymyx Rash and Other (See Comments)    ? Reaction (thinks he remembers redness)    No current facility-administered medications on file prior to encounter.    Current Outpatient Prescriptions on File Prior to Encounter  Medication Sig   . allopurinol (ZYLOPRIM) 100 MG tablet Take 100 mg by mouth daily.  [name] Kitchen  amiodarone (PACERONE) 200 MG tablet Take 1 tablet by mouth daily  . Cholecalciferol (VITAMIN D) 1000 UNITS capsule Take 1 capsule (1,000 Units total) by mouth daily.  . clonazePAM (KLONOPIN) 0.5 MG tablet Take 0.25 mg by mouth daily as needed (Take 1/2 tablet once daily as needed if you wake up early).  . colchicine 0.6 MG tablet Take 0.6 mg by mouth daily as needed (gout flares).   . ferrous sulfate 325 (65 FE) MG tablet Take 325 mg by mouth 2 (two) times daily with a meal.  . fish oil-omega-3 fatty acids 1000 MG capsule Take 1 g by mouth daily.    . Flaxseed, Linseed, (FLAX SEED OIL) 1000 MG CAPS Take 1 capsule by mouth daily.   . furosemide (LASIX) 80 MG tablet Take 80mg  in the morning and 40mg  in the afternoon  . levothyroxine (SYNTHROID, LEVOTHROID) 25 MCG tablet Take 25 mcg by mouth daily before breakfast.  . metolazone (ZAROXOLYN) 2.5 MG tablet Take 1 tablet (2.5 mg total) by mouth daily as needed (edema).  . Multiple Vitamin (MULTIVITAMIN) tablet Take 1 tablet by mouth daily.    [name] Kitchen omeprazole (PRILOSEC) 20 MG capsule TAKE 1 CAPSULE EVERY DAY  (REPLACES  PANTOPRAZOLE)  . potassium chloride (K-DUR) 10 MEQ tablet Take 1 tablet (10 mEq total) by mouth daily.  . propranolol (INDERAL) 10 MG tablet Take 1 tablet (10 mg total) by mouth 2 (two) times daily.  . sertraline (ZOLOFT) 50 MG tablet TAKE 1 TABLET (50 MG TOTAL) BY MOUTH AT BEDTIME.  . traMADol (ULTRAM) 50 MG tablet Take 50 mg by mouth every 8 (eight) hours as needed (pain). Reported on 06/28/2015  . vitamin C (ASCORBIC ACID) 500 MG tablet Take 1 tablet (500 mg total) by mouth daily.  . potassium chloride (K-DUR) 10 MEQ tablet TAKE 1 TABLET EVERY DAY (Patient not taking: Reported on 08/10/2015)    FAMILY HISTORY:  His indicated that his mother is deceased. He indicated that his father is deceased. He indicated that the status of his brother is unknown. He indicated that  the status of his other is unknown.    SOCIAL HISTORY: He  reports that he quit smoking about 53 years ago. His smoking use included Cigarettes. He has a 18.00 pack-year smoking history. He has never used smokeless tobacco. He reports that he does not drink alcohol or use drugs.  REVIEW OF SYSTEMS:   Bolds are positive  Constitutional: weight loss, gain, night sweats, Fevers, chills, fatigue .  HEENT: headaches, Sore throat, sneezing, nasal congestion, post nasal drip, Difficulty swallowing, Tooth/dental problems, visual complaints visual changes, ear ache CV:  chest pain, radiates: ,Orthopnea, PND,  dizziness, palpitations, syncope.  GI  heartburn, indigestion, abdominal pain, nausea, vomiting, diarrhea, change in bowel habits, loss of appetite, bloody stools.  Resp: cough, productive: , hemoptysis, dyspnea, chest pain, pleuritic.  Skin: rash or itching or icterus GU: dysuria, change in color of urine, urgency or frequency. flank pain, hematuria  MS:LLE edema and pain. decreased range of motion  Psych: change in mood or affect. depression or anxiety.  Neuro: difficulty with speech, weakness, numbness, ataxia    SUBJECTIVE:    VITAL SIGNS: BP (!) 80/44   Pulse 87   Temp 99.9 F (37.7 C) (Rectal)   Resp 20   Ht 5\' 11"  (1.803 m)   Wt 75.8 kg (167 lb)   SpO2 100%   BMI 23.29 kg/m   HEMODYNAMICS:    VENTILATOR SETTINGS:    INTAKE / OUTPUT:  No intake/output data recorded.  PHYSICAL EXAMINATION: General:  Elderly male in NAD Neuro:  Alert, oriented, non-focal. HOH HEENT:  [place]/AT, PERRL, no JVD, Sutures R ear from recent lesion removal Cardiovascular:  IRIR, regular rate, no MRG Lungs:  Clear Abdomen:  Soft, non-tender, non-distended Musculoskeletal:  No acute deformity or ROM limitation Skin:  Left lower leg edematous, erythema  LABS:  BMET  Recent Labs Lab 08/10/15 1955 08/10/15 2044  NA 135 136  K 4.3 4.4  CL 107 104  CO2 18*  --   BUN 81* 79*  CREATININE  1.93* 1.90*  GLUCOSE 196* 188*    Electrolytes  Recent Labs Lab 08/10/15 1955  CALCIUM 8.7*    CBC  Recent Labs Lab 08/09/15 1117 08/10/15 1955 08/10/15 2044  WBC 3.6* 7.2  --   HGB 10.8* 10.5* 10.9*  HCT 32.2* 31.8* 32.0*  PLT 29* 22*  --     Coag's No results for input(s): APTT, INR in the last 168 hours.  Sepsis Markers  Recent Labs Lab 08/10/15 2014 08/10/15 2251  LATICACIDVEN 1.99* 1.56    ABG No results for input(s): PHART, PCO2ART, PO2ART in the last 168 hours.  Liver Enzymes  Recent Labs Lab 08/10/15 1955  AST 88*  ALT 80*  ALKPHOS 108  BILITOT 1.3*  ALBUMIN 3.5    Cardiac Enzymes No results for input(s): TROPONINI, PROBNP in the last 168 hours.  Glucose No results for input(s): GLUCAP in the last 168 hours.  Imaging Dg Chest Port 1 View  Result Date: 08/10/2015 CLINICAL DATA:  Possible infection. EXAM: PORTABLE CHEST 1 VIEW COMPARISON:  Radiographs of May 17, 2015. FINDINGS: Mild cardiomegaly is noted. No pneumothorax or pleural effusion is noted. Both lungs are clear. The visualized skeletal structures are unremarkable. IMPRESSION: No acute cardiopulmonary abnormality seen. Electronically Signed   By: Marijo Conception, M.D.   On: 08/10/2015 20:24     STUDIES:    CULTURES: BCx2 8/3 >  ANTIBIOTICS: Clinda in ED 8/2  Ceftriaxone 8/3 >> Vanco 8/3 >  SIGNIFICANT EVENTS: 8/2 admit with cellulitis/septic shock  LINES/TUBES:   DISCUSSION: 82 year old male admitted 8/2 for septic shock likely secondary to LLE cellulitis. Needing some levophed for BP support. Low dose now and suspect he will wean off in the coming hours. DNR. CTX and vancomycin for ABX  ASSESSMENT / PLAN:  PULMONARY A: No acute issues   P:   Monitor  CARDIOVASCULAR A:  Septic shock PAF QTc prolongation Chronic diastolic CHF Hx HTN, TAVR  P:  Tele MAP goal > 80mmHg Levophed through peripheral for now, if dose increasing will consider CVL Bolus 1L NS  now Lactic wnl  RENAL A:   CKD   P:   Gently hydrate Follow BMP  GASTROINTESTINAL A:   No acute issues  P:   Heart healthy diet  HEMATOLOGIC A:   Lymphoproliferative disorder (anemia, thrombocytopenia, leukopenia chronically)  P:  Follow CBC  INFECTIOUS A:   Septic shock LLE cellulitis Hx of recent UTI but UA looks good and no symptoms to support this   P:   ABX as above Follow cultures  ENDOCRINE A:   DM Hypothyroid  P:   CBG and SSI AC/HS  NEUROLOGIC A:   No acute issues  P:   monitor   FAMILY  - Updates: patient updated in ED  - Inter-disciplinary family meet or Palliative Care meeting due by:  [place], AGACNP-BC [facility]/Critical [facility] Pager [phone] or [phone]  08/11/2015 12:32 AM    Attending Note:  I have examined patient, reviewed labs, studies and notes. I have discussed the case with Jaclynn Guarneri, and I agree with the data and plans as amended above. 82 yo man with cardiac hx as detailed above. He bumped his LLE 4 days ago, has had progressive erythema, pain, swelling. Saw PCP on 8/2 and was hypotensive and sent to ED. He received IVFs and required norepi. started clinda for cellulitis and septic shock. On my eval he is awake and appropriate. LLE is warm, swollen with erythema from the knee to the ankle. He is on norepi 4 currently with improved BP. We will change to ceftriaxone + vanco, wean norepi as able. Watch him in ICU until off pressors. Give another 1000cc IVF. Independent critical care time is 45 minutes.   Baltazar Apo, MD, PhD 08/11/2015, 12:39 AM  Pulmonary and Critical Care [phone] or if no answer [phone]

## 2015-08-11 ENCOUNTER — Telehealth: Payer: Self-pay

## 2015-08-11 DIAGNOSIS — Z952 Presence of prosthetic heart valve: Secondary | ICD-10-CM | POA: Diagnosis not present

## 2015-08-11 DIAGNOSIS — Z79899 Other long term (current) drug therapy: Secondary | ICD-10-CM | POA: Diagnosis not present

## 2015-08-11 DIAGNOSIS — R6521 Severe sepsis with septic shock: Secondary | ICD-10-CM | POA: Diagnosis not present

## 2015-08-11 DIAGNOSIS — E1122 Type 2 diabetes mellitus with diabetic chronic kidney disease: Secondary | ICD-10-CM | POA: Diagnosis present

## 2015-08-11 DIAGNOSIS — I5032 Chronic diastolic (congestive) heart failure: Secondary | ICD-10-CM | POA: Diagnosis present

## 2015-08-11 DIAGNOSIS — I130 Hypertensive heart and chronic kidney disease with heart failure and stage 1 through stage 4 chronic kidney disease, or unspecified chronic kidney disease: Secondary | ICD-10-CM | POA: Diagnosis present

## 2015-08-11 DIAGNOSIS — I4581 Long QT syndrome: Secondary | ICD-10-CM | POA: Diagnosis present

## 2015-08-11 DIAGNOSIS — E039 Hypothyroidism, unspecified: Secondary | ICD-10-CM | POA: Diagnosis present

## 2015-08-11 DIAGNOSIS — Z87891 Personal history of nicotine dependence: Secondary | ICD-10-CM | POA: Diagnosis not present

## 2015-08-11 DIAGNOSIS — A419 Sepsis, unspecified organism: Secondary | ICD-10-CM | POA: Diagnosis not present

## 2015-08-11 DIAGNOSIS — Z85828 Personal history of other malignant neoplasm of skin: Secondary | ICD-10-CM | POA: Diagnosis not present

## 2015-08-11 DIAGNOSIS — Z66 Do not resuscitate: Secondary | ICD-10-CM | POA: Diagnosis present

## 2015-08-11 DIAGNOSIS — K7200 Acute and subacute hepatic failure without coma: Secondary | ICD-10-CM | POA: Diagnosis present

## 2015-08-11 DIAGNOSIS — L03116 Cellulitis of left lower limb: Secondary | ICD-10-CM | POA: Diagnosis present

## 2015-08-11 DIAGNOSIS — D61818 Other pancytopenia: Secondary | ICD-10-CM | POA: Diagnosis present

## 2015-08-11 DIAGNOSIS — I951 Orthostatic hypotension: Secondary | ICD-10-CM | POA: Diagnosis present

## 2015-08-11 DIAGNOSIS — K4090 Unilateral inguinal hernia, without obstruction or gangrene, not specified as recurrent: Secondary | ICD-10-CM | POA: Diagnosis present

## 2015-08-11 DIAGNOSIS — Z880 Allergy status to penicillin: Secondary | ICD-10-CM | POA: Diagnosis not present

## 2015-08-11 DIAGNOSIS — Z8673 Personal history of transient ischemic attack (TIA), and cerebral infarction without residual deficits: Secondary | ICD-10-CM | POA: Diagnosis not present

## 2015-08-11 DIAGNOSIS — I10 Essential (primary) hypertension: Secondary | ICD-10-CM | POA: Diagnosis not present

## 2015-08-11 DIAGNOSIS — W57XXXA Bitten or stung by nonvenomous insect and other nonvenomous arthropods, initial encounter: Secondary | ICD-10-CM | POA: Diagnosis present

## 2015-08-11 DIAGNOSIS — I480 Paroxysmal atrial fibrillation: Secondary | ICD-10-CM | POA: Diagnosis present

## 2015-08-11 DIAGNOSIS — D479 Neoplasm of uncertain behavior of lymphoid, hematopoietic and related tissue, unspecified: Secondary | ICD-10-CM | POA: Diagnosis not present

## 2015-08-11 DIAGNOSIS — Z888 Allergy status to other drugs, medicaments and biological substances status: Secondary | ICD-10-CM | POA: Diagnosis not present

## 2015-08-11 DIAGNOSIS — N183 Chronic kidney disease, stage 3 (moderate): Secondary | ICD-10-CM | POA: Diagnosis present

## 2015-08-11 DIAGNOSIS — L03115 Cellulitis of right lower limb: Secondary | ICD-10-CM | POA: Diagnosis present

## 2015-08-11 DIAGNOSIS — E875 Hyperkalemia: Secondary | ICD-10-CM | POA: Diagnosis not present

## 2015-08-11 LAB — CBC
HCT: 31.2 % — ABNORMAL LOW (ref 39.0–52.0)
Hemoglobin: 10.2 g/dL — ABNORMAL LOW (ref 13.0–17.0)
MCH: 28.4 pg (ref 26.0–34.0)
MCHC: 32.7 g/dL (ref 30.0–36.0)
MCV: 86.9 fL (ref 78.0–100.0)
PLATELETS: 28 10*3/uL — AB (ref 150–400)
RBC: 3.59 MIL/uL — AB (ref 4.22–5.81)
RDW: 17.4 % — ABNORMAL HIGH (ref 11.5–15.5)
WBC: 6.7 10*3/uL (ref 4.0–10.5)

## 2015-08-11 LAB — GLUCOSE, CAPILLARY
GLUCOSE-CAPILLARY: 169 mg/dL — AB (ref 65–99)
GLUCOSE-CAPILLARY: 165 mg/dL — AB (ref 65–99)
GLUCOSE-CAPILLARY: 142 mg/dL — AB (ref 65–99)
Glucose-Capillary: 171 mg/dL — ABNORMAL HIGH (ref 65–99)
Glucose-Capillary: 179 mg/dL — ABNORMAL HIGH (ref 65–99)
Glucose-Capillary: 156 mg/dL — ABNORMAL HIGH (ref 65–99)

## 2015-08-11 LAB — BASIC METABOLIC PANEL
ANION GAP: 5 (ref 5–15)
BUN: 70 mg/dL — ABNORMAL HIGH (ref 6–20)
CO2: 18 mmol/L — ABNORMAL LOW (ref 22–32)
Calcium: 8.1 mg/dL — ABNORMAL LOW (ref 8.9–10.3)
Chloride: 113 mmol/L — ABNORMAL HIGH (ref 101–111)
Creatinine, Ser: 1.79 mg/dL — ABNORMAL HIGH (ref 0.61–1.24)
GFR, EST AFRICAN AMERICAN: 36 mL/min — AB (ref >60)
GFR, EST NON AFRICAN AMERICAN: 31 mL/min — AB (ref >60)
Glucose, Bld: 184 mg/dL — ABNORMAL HIGH (ref 65–99)
POTASSIUM: 3.9 mmol/L (ref 3.5–5.1)
SODIUM: 136 mmol/L (ref 135–145)

## 2015-08-11 LAB — MRSA PCR SCREENING: MRSA BY PCR: NEGATIVE

## 2015-08-11 LAB — MAGNESIUM: MAGNESIUM: 1.9 mg/dL (ref 1.7–2.4)

## 2015-08-11 LAB — PHOSPHORUS: PHOSPHORUS: 3.8 mg/dL (ref 2.5–4.6)

## 2015-08-11 MED ORDER — SODIUM CHLORIDE 0.9 % IV SOLN: 250.0000 mL | INTRAVENOUS | Status: DC | PRN

## 2015-08-11 MED ORDER — VANCOMYCIN HCL IN DEXTROSE 1-5 GM/200ML-% IV SOLN
1000.0000 mg | INTRAVENOUS | Status: DC
  Administered 2015-08-11 – 2015-08-12 (×2): 1000 mg via INTRAVENOUS
  Filled 2015-08-11 (×2): qty 200

## 2015-08-11 MED ORDER — INSULIN ASPART 100 UNIT/ML ~~LOC~~ SOLN
2.0000 [IU] | Freq: Three times a day (TID) | SUBCUTANEOUS | Status: DC
  Administered 2015-08-11: 4 [IU] via SUBCUTANEOUS

## 2015-08-11 MED ORDER — MAGNESIUM SULFATE 2 GM/50ML IV SOLN
2.0000 g | Freq: Once | INTRAVENOUS | Status: AC
  Administered 2015-08-11: 2 g via INTRAVENOUS
  Filled 2015-08-11 (×2): qty 50

## 2015-08-11 MED ORDER — PANTOPRAZOLE SODIUM 40 MG PO TBEC
40.0000 mg | DELAYED_RELEASE_TABLET | Freq: Every day | ORAL | Status: DC
  Administered 2015-08-11 – 2015-08-17 (×7): 40 mg via ORAL
  Filled 2015-08-11 (×7): qty 1

## 2015-08-11 MED ORDER — NOREPINEPHRINE BITARTRATE 1 MG/ML IV SOLN
0.0000 ug/min | INTRAVENOUS | Status: DC
  Administered 2015-08-11: 5 ug/min via INTRAVENOUS
  Filled 2015-08-11: qty 4

## 2015-08-11 MED ORDER — SODIUM CHLORIDE 0.9 % IV BOLUS (SEPSIS)
1000.0000 mL | Freq: Once | INTRAVENOUS | Status: AC
  Administered 2015-08-11: 1000 mL via INTRAVENOUS

## 2015-08-11 MED ORDER — INSULIN ASPART 100 UNIT/ML ~~LOC~~ SOLN
0.0000 [IU] | Freq: Three times a day (TID) | SUBCUTANEOUS | Status: DC
  Administered 2015-08-11: 2 [IU] via SUBCUTANEOUS
  Administered 2015-08-11 – 2015-08-12 (×2): 3 [IU] via SUBCUTANEOUS
  Administered 2015-08-13: 5 [IU] via SUBCUTANEOUS
  Administered 2015-08-13: 2 [IU] via SUBCUTANEOUS
  Administered 2015-08-13: 5 [IU] via SUBCUTANEOUS
  Administered 2015-08-14: 2 [IU] via SUBCUTANEOUS
  Administered 2015-08-14: 3 [IU] via SUBCUTANEOUS
  Administered 2015-08-14: 5 [IU] via SUBCUTANEOUS
  Administered 2015-08-15 – 2015-08-16 (×2): 2 [IU] via SUBCUTANEOUS
  Administered 2015-08-16: 3 [IU] via SUBCUTANEOUS
  Administered 2015-08-16 – 2015-08-17 (×2): 2 [IU] via SUBCUTANEOUS

## 2015-08-11 MED ORDER — INSULIN ASPART 100 UNIT/ML ~~LOC~~ SOLN
0.0000 [IU] | Freq: Every day | SUBCUTANEOUS | Status: DC
  Administered 2015-08-12: 2 [IU] via SUBCUTANEOUS

## 2015-08-11 MED ORDER — HEPARIN SODIUM (PORCINE) 5000 UNIT/ML IJ SOLN
5000.0000 [IU] | Freq: Three times a day (TID) | INTRAMUSCULAR | Status: DC
  Administered 2015-08-11: 5000 [IU] via SUBCUTANEOUS
  Filled 2015-08-11 (×3): qty 1

## 2015-08-11 MED ORDER — DEXTROSE 5 % IV SOLN
2.0000 g | INTRAVENOUS | Status: DC
  Administered 2015-08-12 – 2015-08-13 (×2): 2 g via INTRAVENOUS
  Filled 2015-08-11 (×3): qty 2

## 2015-08-11 MED ORDER — SODIUM CHLORIDE 0.9 % IV SOLN
INTRAVENOUS | Status: DC
  Administered 2015-08-11 – 2015-08-13 (×2): via INTRAVENOUS

## 2015-08-11 MED ORDER — DEXTROSE 5 % IV SOLN
2.0000 g | Freq: Once | INTRAVENOUS | Status: AC
  Administered 2015-08-11: 2 g via INTRAVENOUS
  Filled 2015-08-11: qty 2

## 2015-08-11 NOTE — Telephone Encounter (Signed)
FYI: I spoke with pts Daughter Juliann Pulse) this morning. She is heading back from the beach today to be with the patient. A family friend was at his bedside last night. She will let us know if she has any other concerns.

## 2015-08-11 NOTE — Care Management Note (Signed)
Case Management Note  Patient Details  Name: [name] MRN: [id number] Date of Birth: 06-30-1922  Subjective/Objective:          Pt admitted with LLE pain and hypotention          Action/Plan:  PTA from home independent.  CM will continue to monitor for discharge needs   Expected Discharge Date:  08/15/15               Expected Discharge Plan:  [place]  In-House Referral:     Discharge planning Services  CM Consult  Post Acute Care Choice:    Choice offered to:     DME Arranged:    DME Agency:     HH Arranged:    HH Agency:     Status of Service:  In process, will continue to follow  If discussed at Long Length of Stay Meetings, dates discussed:    Additional Comments:  Maryclare Labrador, RN 08/11/2015, 9:41 AM

## 2015-08-11 NOTE — Plan of Care (Signed)
Problem: Education: Goal: Knowledge of [place] [facility] Education information/materials will improve Outcome: Progressing Patient oriented to unit, hospital, and given patient admission guide. Taught patient how to use call light and make phone calls. Patient advised that he should not try to get out of bed independently. Voiced understanding that he should call for assistance. Patient informed that we will be monitoring his blood results for changes and monitoring his LLE for changes to cellulitis. Patient advised on rating his pain and that he should call if he wants pain medication. Patient not willing to take medication at this time, will continue to monitor. Patient aware that we will prepare him for discharge once it is closer.   Problem: Safety: Goal: Ability to remain free from injury will improve Outcome: Progressing Patient given call light and advised to call for assistance to prevent any complications to his care.  Problem: Health Behavior/Discharge Planning: Goal: Ability to manage health-related needs will improve Outcome: Progressing Patient continues to receive IV antibiotics.   Problem: Pain Managment: Goal: General experience of comfort will improve Outcome: Not Progressing Patient refusing pain medication. States he can live with it at the current level.  Problem: Physical Regulation: Goal: Ability to maintain clinical measurements within normal limits will improve Outcome: Progressing Patient states his pain has not gotten any worse, but also not improved yet. Will continue to monitor. Goal: Will remain free from infection Outcome: Progressing Currently being treated for cellulitis of lower leg.  Problem: Skin Integrity: Goal: Risk for impaired skin integrity will decrease Outcome: Progressing Patient has a prophylactic foam to sacrum, pt able to turn self, advising to turn to take pressure off backside. LLE supported on pillow.   Problem: Tissue  Perfusion: Goal: Risk factors for ineffective tissue perfusion will decrease Outcome: Progressing SCD to RLE, as patient has cellulitis to LLE.  Problem: Activity: Goal: Risk for activity intolerance will decrease Outcome: Progressing Patient unable to participate in activity out of bed at this time. Will ensure that he turns and participates in any activity possible.  Problem: Fluid Volume: Goal: Ability to maintain a balanced intake and output will improve Outcome: Progressing Encouraging patient to take in fluids and eat meals. Per patient his appetite has not been great prior to discharge and he has lost some weight. Will consider nutrition consult if necessary.  Problem: Nutrition: Goal: Adequate nutrition will be maintained Outcome: Progressing Will put in nutrition consult if patient appetite continues to be a problem. Ate dinner tray this PM, but per patient has had decreased appetite.  Problem: Bowel/Gastric: Goal: Will not experience complications related to bowel motility Outcome: Progressing Patient had BM yesterday. Continue to monitor for regularity.  Problem: Education: Goal: Verbalization of understanding the information provided will improve Outcome: Progressing Taught patient about what his condition is and verbalized understanding that we are treating it with antibiotics, but it will take time to improve. Patient agreeable to this.  Problem: Physical Regulation: Goal: Ability to avoid or minimize complications of infection will improve Outcome: Progressing Patient receiving scheduled antibiotics, site assessed frequently, and patient advised to inform staff if there is increase in pain or change in sensation.  Problem: Skin Integrity: Goal: Skin integrity will improve Outcome: Progressing Antibiotics will be given. Leg propped up on pillows, and assessed frequently.

## 2015-08-11 NOTE — Progress Notes (Signed)
Pharmacy Antibiotic Note  [name] is a 82 y.o. male admitted on 08/10/2015 with sepsis.  Pharmacy has been consulted for Vancocin and Rocephin dosing.  Of note pt was recently admitted for IV ABX for UTI/pyelo, req'd ICU admission and pressors, completed 2wk of ABX between hospital and home; now sepsis is thought to be 2/2 cellulitis.  Plan: Vancomycin 1000mg  IV every 24 hours.  Goal trough 15-20 mcg/mL. Rocephin 2g IV every 24 hours.  Height: 5\' 11"  (180.3 cm) Weight: 175 lb 14.8 oz (79.8 kg) IBW/kg (Calculated) : 75.3  Temp (24hrs), Avg:98.8 F (37.1 C), Min:98.2 F (36.8 C), Max:99.9 F (37.7 C)   Recent Labs Lab 08/09/15 1117 08/10/15 1955 08/10/15 2014 08/10/15 2044 08/10/15 2251 08/11/15 0218  WBC 3.6* 7.2  --   --   --  6.7  CREATININE  --  1.93*  --  1.90*  --   --   LATICACIDVEN  --   --  1.99*  --  1.56  --     Estimated Creatinine Clearance: 27 mL/min (by C-G formula based on SCr of 1.9 mg/dL).    Allergies  Allergen Reactions  . Penicillins Other (See Comments)    Does not remember reaction (~year 1950) Has patient had a PCN reaction causing immediate rash, facial/tongue/throat swelling, SOB or lightheadedness with hypotension:YES Has patient had a PCN reaction causing severe rash involving mucus membranes or skin necrosis: NO Has patient had a PCN reaction that required hospitalization NO Has patient had a PCN reaction occurring within the last 10 years: NO If all of the above answers are "NO", then may proceed with Cephalosporin use.  [name] Kitchen Neomycin-Bacitracin Zn-Polymyx Rash and Other (See Comments)    ? Reaction (thinks he remembers redness)     Thank you for allowing pharmacy to be a part of this patient's care.  Wynona Neat, PharmD, BCPS  08/11/2015 3:35 AM

## 2015-08-11 NOTE — Progress Notes (Signed)
Sutures removed without complication from right ear per MD order.

## 2015-08-11 NOTE — Progress Notes (Signed)
PULMONARY / CRITICAL CARE MEDICINE   Name: [name] MRN: [id number] DOB: 07-22-1922    ADMISSION DATE:  08/10/2015 CONSULTATION DATE:  08/10/2015  REFERRING MD:  Dr. Tyrone Nine EDP  CHIEF COMPLAINT:  LLE edema  HISTORY OF PRESENT ILLNESS:   82 year old male with PMH as below, which is significant for AS s/p TAVR 2016, dCHF, CKD III, DM, lymphoproliferative disorder with severe pancytopenia followed by Dr. Benay Spice and PAF not on anticoagulation. He was recently admitted for UTI/pyelonephritis requiring ICU, peripheral vasopressor, and broad spectrum antibiotics. Culture grew E.coli and he was eventually discharged on Omnicef for 2 weeks total ABX. He recently developed cellulitis of R leg, which was treated as an outpatient. 7/25 he was taken electively to OR for excision of R ear lesion, sutures remain in place.   7/29 he was mowing his lawn when he developed acute onset L leg pain. He thinks that maybe a branch hit it, however, he was wearing TED stocking at that time. LLE pain and edema have been progressive since that time, more recently erythema had developed. 8/2 he had some chills and several episodes of vomiting. He presented to PCP for these complaints. Based on his symptoms and appearance of his leg, with new onset hypotension and orthostasis he was referred to ED with concerns of sepsis.   In the emergency department LLE pain persisted and hypotension was confirmed. He remained hypotensive despite 30cc/kg IVF bolus. He was started on norepinephrine for BP support and PCCM called for admission. Pertinent labs include CO2 18, SCr 1.93 (close to baseline), AST/ALT mildly elevated, Lactic acid 1.9, glucose 196.  SUBJECTIVE: No events overnight, c/o left leg pain.  VITAL SIGNS: BP 116/69   Pulse 91   Temp 98.7 F (37.1 C) (Oral)   Resp 19   Ht 5\' 11"  (1.803 m)   Wt 79.8 kg (175 lb 14.8 oz)   SpO2 100%   BMI 24.54 kg/m   HEMODYNAMICS:    VENTILATOR SETTINGS:    INTAKE /  OUTPUT: I/O last 3 completed shifts: In: 4243.4 [I.V.:2993.4; IV [id number] Out: 1125 [id number]  PHYSICAL EXAMINATION: General:  Elderly male in NAD Neuro:  Alert, oriented, non-focal. HOH HEENT:  [place]/AT, PERRL, no JVD, Sutures R ear from recent lesion removal Cardiovascular:  IRIR, regular rate, no MRG Lungs:  Clear Abdomen:  Soft, non-tender, non-distended Musculoskeletal:  No acute deformity or ROM limitation Skin:  Left lower leg edematous, erythema  LABS:  BMET  Recent Labs Lab 08/10/15 1955 08/10/15 2044 08/11/15 0218  NA 135 136 136  K 4.3 4.4 3.9  CL 107 104 113*  CO2 18*  --  18*  BUN 81* 79* 70*  CREATININE 1.93* 1.90* 1.79*  GLUCOSE 196* 188* 184*   Electrolytes  Recent Labs Lab 08/10/15 1955 08/11/15 0218  CALCIUM 8.7* 8.1*  MG  --  1.9  PHOS  --  3.8   CBC  Recent Labs Lab 08/09/15 1117 08/10/15 1955 08/10/15 2044 08/11/15 0218  WBC 3.6* 7.2  --  6.7  HGB 10.8* 10.5* 10.9* 10.2*  HCT 32.2* 31.8* 32.0* 31.2*  PLT 29* 22*  --  28*   Coag's No results for input(s): APTT, INR in the last 168 hours.  Sepsis Markers  Recent Labs Lab 08/10/15 2014 08/10/15 2251  LATICACIDVEN 1.99* 1.56   ABG No results for input(s): PHART, PCO2ART, PO2ART in the last 168 hours.  Liver Enzymes  Recent Labs Lab 08/10/15 1955  AST 88*  ALT 80*  ALKPHOS 108  BILITOT 1.3*  ALBUMIN 3.5   Cardiac Enzymes No results for input(s): TROPONINI, PROBNP in the last 168 hours.  Glucose No results for input(s): GLUCAP in the last 168 hours.  Imaging Dg Chest Port 1 View  Result Date: 08/10/2015 CLINICAL DATA:  Possible infection. EXAM: PORTABLE CHEST 1 VIEW COMPARISON:  Radiographs of May 17, 2015. FINDINGS: Mild cardiomegaly is noted. No pneumothorax or pleural effusion is noted. Both lungs are clear. The visualized skeletal structures are unremarkable. IMPRESSION: No acute cardiopulmonary abnormality seen. Electronically Signed   By: Marijo Conception, M.D.   On: 08/10/2015 20:24   STUDIES   CULTURES: BCx2 8/3 >  ANTIBIOTICS: Clinda in ED 8/2 off Ceftriaxone 8/3>> Vanco 8/3>>  SIGNIFICANT EVENTS: 8/2 admit with cellulitis/septic shock  LINES/TUBES:   DISCUSSION: 82 year old male admitted 8/2 for septic shock likely secondary to LLE cellulitis. Needing some levophed for BP support. Low dose now and suspect he will wean off in the coming hours. DNR. CTX and vancomycin for ABX  ASSESSMENT / PLAN:  PULMONARY A: No acute issues   P:   Monitor DNR  CARDIOVASCULAR A:  Septic shock PAF QTc prolongation Chronic diastolic CHF Hx HTN, TAVR  P:  Tele MAP goal > 36mmHg DC levo as able KVO IVF  RENAL A:   CKD   P:   KVO IVF Follow BMP  GASTROINTESTINAL A:   No acute issues  P:   Heart healthy diet  HEMATOLOGIC A:   Lymphoproliferative disorder (anemia, thrombocytopenia, leukopenia chronically)  P:  Follow CBC  INFECTIOUS A:   Septic shock LLE cellulitis Hx of recent UTI but UA looks good and no symptoms to support this   P:   ABX as above Follow cultures  ENDOCRINE A:   DM Hypothyroid  P:   CBG and SSI AC/HS  NEUROLOGIC A:   No acute issues  P:   Monitor  FAMILY  - Updates: Patient updated at length bedside.  Hope to be able to d/c pressors and transfer out of the ICU and to [facility] service with PCCM off 8/4.  - Inter-disciplinary family meet or Palliative Care meeting due by:  8/10  The patient is critically ill with multiple organ systems failure and requires high complexity decision making for assessment and support, frequent evaluation and titration of therapies, application of advanced monitoring technologies and extensive interpretation of multiple databases.   Critical Care Time devoted to patient care services described in this note is  35  Minutes. This time reflects time of care of this signee Dr Jennet Maduro. This critical care time does not reflect procedure time, or  teaching time or supervisory time of PA/NP/Med student/Med Resident etc but could involve care discussion time.  Rush Farmer, M.D. [facility] Pulmonary/Critical Care Medicine. Pager: [phone]. After hours pager: [phone].

## 2015-08-12 LAB — COMPREHENSIVE METABOLIC PANEL
ALK PHOS: 83 U/L (ref 38–126)
ALT: 42 U/L (ref 17–63)
ANION GAP: 7 (ref 5–15)
AST: 21 U/L (ref 15–41)
Albumin: 2.9 g/dL — ABNORMAL LOW (ref 3.5–5.0)
BUN: 57 mg/dL — ABNORMAL HIGH (ref 6–20)
CALCIUM: 8.6 mg/dL — AB (ref 8.9–10.3)
CO2: 21 mmol/L — ABNORMAL LOW (ref 22–32)
CREATININE: 1.8 mg/dL — AB (ref 0.61–1.24)
Chloride: 111 mmol/L (ref 101–111)
GFR, EST AFRICAN AMERICAN: 36 mL/min — AB (ref >60)
GFR, EST NON AFRICAN AMERICAN: 31 mL/min — AB (ref >60)
Glucose, Bld: 202 mg/dL — ABNORMAL HIGH (ref 65–99)
Potassium: 4.8 mmol/L (ref 3.5–5.1)
Sodium: 139 mmol/L (ref 135–145)
Total Bilirubin: 0.7 mg/dL (ref 0.3–1.2)
Total Protein: 5.3 g/dL — ABNORMAL LOW (ref 6.5–8.1)

## 2015-08-12 LAB — CBC
HCT: 27.4 % — ABNORMAL LOW (ref 39.0–52.0)
HEMATOCRIT: 25.5 % — AB (ref 39.0–52.0)
HEMOGLOBIN: 8.8 g/dL — AB (ref 13.0–17.0)
HEMOGLOBIN: 8.7 g/dL — AB (ref 13.0–17.0)
MCH: 29.6 pg (ref 26.0–34.0)
MCH: 28.2 pg (ref 26.0–34.0)
MCHC: 34.5 g/dL (ref 30.0–36.0)
MCHC: 31.8 g/dL (ref 30.0–36.0)
MCV: 85.9 fL (ref 78.0–100.0)
MCV: 89 fL (ref 78.0–100.0)
PLATELETS: 27 10*3/uL — AB (ref 150–400)
Platelets: 19 10*3/uL — CL (ref 150–400)
RBC: 2.97 MIL/uL — AB (ref 4.22–5.81)
RBC: 3.08 MIL/uL — AB (ref 4.22–5.81)
RDW: 18 % — ABNORMAL HIGH (ref 11.5–15.5)
RDW: 18 % — ABNORMAL HIGH (ref 11.5–15.5)
WBC: 2.1 10*3/uL — AB (ref 4.0–10.5)
WBC: 1.9 10*3/uL — ABNORMAL LOW (ref 4.0–10.5)

## 2015-08-12 LAB — MAGNESIUM: MAGNESIUM: 2.7 mg/dL — AB (ref 1.7–2.4)

## 2015-08-12 LAB — BASIC METABOLIC PANEL
ANION GAP: 9 (ref 5–15)
BUN: 62 mg/dL — ABNORMAL HIGH (ref 6–20)
CHLORIDE: 111 mmol/L (ref 101–111)
CO2: 18 mmol/L — AB (ref 22–32)
CREATININE: 1.89 mg/dL — AB (ref 0.61–1.24)
Calcium: 8.4 mg/dL — ABNORMAL LOW (ref 8.9–10.3)
GFR calc non Af Amer: 29 mL/min — ABNORMAL LOW (ref >60)
GFR, EST AFRICAN AMERICAN: 34 mL/min — AB (ref >60)
Glucose, Bld: 152 mg/dL — ABNORMAL HIGH (ref 65–99)
POTASSIUM: 6 mmol/L — AB (ref 3.5–5.1)
SODIUM: 138 mmol/L (ref 135–145)

## 2015-08-12 LAB — GLUCOSE, CAPILLARY
GLUCOSE-CAPILLARY: 174 mg/dL — AB (ref 65–99)
GLUCOSE-CAPILLARY: 202 mg/dL — AB (ref 65–99)

## 2015-08-12 LAB — PHOSPHORUS: PHOSPHORUS: 3.4 mg/dL (ref 2.5–4.6)

## 2015-08-12 MED ORDER — ALLOPURINOL 100 MG PO TABS
100.0000 mg | ORAL_TABLET | Freq: Every day | ORAL | Status: DC
  Administered 2015-08-12 – 2015-08-17 (×6): 100 mg via ORAL
  Filled 2015-08-12 (×6): qty 1

## 2015-08-12 MED ORDER — FLAX SEED OIL 1000 MG PO CAPS: 1.0000 | ORAL_CAPSULE | Freq: Every day | ORAL | Status: DC

## 2015-08-12 MED ORDER — ADULT MULTIVITAMIN W/MINERALS CH
1.0000 | ORAL_TABLET | Freq: Every day | ORAL | Status: DC
  Administered 2015-08-12 – 2015-08-17 (×6): 1 via ORAL
  Filled 2015-08-12 (×6): qty 1

## 2015-08-12 MED ORDER — OMEGA-3 FATTY ACIDS 1000 MG PO CAPS: 1.0000 g | ORAL_CAPSULE | Freq: Every day | ORAL | Status: DC

## 2015-08-12 MED ORDER — MAGNESIUM SULFATE 2 GM/50ML IV SOLN: 2.0000 g | Freq: Once | INTRAVENOUS | Status: DC

## 2015-08-12 MED ORDER — TRAMADOL HCL 50 MG PO TABS: 50.0000 mg | ORAL_TABLET | Freq: Three times a day (TID) | ORAL | Status: DC | PRN

## 2015-08-12 MED ORDER — AMIODARONE HCL 200 MG PO TABS
200.0000 mg | ORAL_TABLET | Freq: Every day | ORAL | Status: DC
  Administered 2015-08-12 – 2015-08-17 (×6): 200 mg via ORAL
  Filled 2015-08-12 (×6): qty 1

## 2015-08-12 MED ORDER — LEVOTHYROXINE SODIUM 25 MCG PO TABS
25.0000 ug | ORAL_TABLET | Freq: Every day | ORAL | Status: DC
  Administered 2015-08-12: 25 ug via ORAL
  Filled 2015-08-12: qty 1

## 2015-08-12 NOTE — Progress Notes (Signed)
DR Thereasa Solo notified of potassium of 6

## 2015-08-12 NOTE — Progress Notes (Signed)
[name] TEAM 1 - Stepdown/ICU TEAM  [name]  [id number] DOB: 1922-06-23 DOA: 08/10/2015 PCP: Walter Rail, MD    Brief Narrative:  82 year old male with Hx of AS s/p TAVR 2016, dCHF, CKD III, DM, lymphoproliferative disorder with severe pancytopenia followed by Dr. Benay Walton, and PAF not on anticoagulation who was recently admitted for UTI/pyelonephritis requiring ICU, peripheral vasopressor, and broad spectrum antibiotics. Cultures grew E.coli and he was eventually discharged on Omnicef for 2 weeks total ABX. He later developed cellulitis of the R leg and was treated as an outpatient. 7/25 he was taken electively to OR for excision of R ear lesion, sutures remain in place.   7/29 he was mowing his lawn when he developed acute onset L leg pain. He thinks that maybe a branch hit it. LLE pain and edema progressed since that time, and eventually erythema developed. 8/2 he had some chills and several episodes of vomiting. He presented to PCP for these complaints. Based on his symptoms and appearance of his leg, with new onset hypotension and orthostasis, he was referred to the ED with concerns of sepsis.   In the ED LLE pain persisted and hypotension was confirmed. He remained hypotensive despite 30cc/kg IVF bolus. He was started on norepinephrine for BP support and PCCM called for admission. Pertinent labs included CO2 18, SCr 1.93 (close to baseline), AST/ALT mildly elevated, Lactic acid 1.9, glucose 196.  Subjective: Pt is resting comfortably.  He feels much better, and denies cp, sob, n/v, or abdom pain.  His L LE is feeling much better and is much less swollen per his report.    Assessment & Plan:  Septic shock due to LLE cellulitis Shock resolved - narrow abx to rocephin alone for now, but may need to change to a non beta-lactam if plt count continues to fall - stop vanc as infection not suggestive of MRSA and screen negative  Mild transaminitis  Likely shock liver - f/u in AM    Parox AFib CHADS2-Vasc at least 4 - not on anticoag because of pancytopenia - HR controlled   QTc prolongation - chronic  Follow on tele   Chronic diastolic CHF Baseline wgt appears to be ~82kg - no evidence of volume overload at this time  Filed Weights   08/11/15 0230 08/11/15 1400 08/12/15 0500  Weight: 79.8 kg (175 lb 14.8 oz) 83.8 kg (184 lb 11.9 oz) 82.5 kg (181 lb 14.1 oz)    Hx of HTN BP currently remains lower than baseline - cont to hold BP meds and follow - is not presently orthostatic   AoS s/p TAVR 2016 No clinical evidence to suggest valve dysfxn  CKD stage 3 Baseline crt 1.9-2.2 - stable at present   Lymphoproliferative disorder - chronic pancytopenia  plt count appears to be trending downward - I suspect this is due to consumption, w/ signif vasculitis type change to L LE - keep in mind this could be due to B lactam abx - follow trend - no evidence of acute blood loss at this time   DM Reasonably controlled - follow  Hypothyroid cont home synthroid   DVT prophylaxis: SCD R LE  Code Status: DNR - NO CODE Family Communication: spoke w/ daughter at bedside  Disposition Plan: SDU  Consultants:  PCCM  Procedures: none  Antimicrobials:  Clinda 8/2 Ceftriaxone 8/2 > Vanco 8/2 >  Objective: Blood pressure 119/75, pulse 81, temperature 98.4 F (36.9 C), temperature source Oral, resp. rate (!) 9,  height 5\' 10"  (1.778 m), weight 82.5 kg (181 lb 14.1 oz), SpO2 100 %.  Intake/Output Summary (Last 24 hours) at 08/12/15 1012 Last data filed at 08/12/15 0800  Gross per 24 hour  Intake              570 ml  Output             1550 ml  Net             -980 ml   Filed Weights   08/11/15 0230 08/11/15 1400 08/12/15 0500  Weight: 79.8 kg (175 lb 14.8 oz) 83.8 kg (184 lb 11.9 oz) 82.5 kg (181 lb 14.1 oz)    Examination: General: No acute respiratory distress Lungs: Clear to auscultation bilaterally without wheezes or crackles Cardiovascular: Regular rate  w/ irreg rhythm without murmur gallop or rub normal S1 and S2 Abdomen: Nontender, nondistended, soft, bowel sounds positive, no rebound, no ascites, no appreciable mass Extremities: No significant cyanosis, clubbing, or edema R LE - L LE w/ leukocytoclastic type discoloration circumferentially from ankle to knee w/ 1+ edema w/o calor or purulent d/c or fluctuance   CBC:  Recent Labs Lab 08/09/15 1117 08/10/15 1955 08/10/15 2044 08/11/15 0218 08/12/15 0427  WBC 3.6* 7.2  --  6.7 2.1*  NEUTROABS 2.9 6.4  --   --   --   HGB 10.8* 10.5* 10.9* 10.2* 8.8*  HCT 32.2* 31.8* 32.0* 31.2* 25.5*  MCV 86.3 86.9  --  86.9 85.9  PLT 29* 22*  --  28* 19*   Basic Metabolic Panel:  Recent Labs Lab 08/10/15 1955 08/10/15 2044 08/11/15 0218 08/12/15 0427  NA 135 136 136 138  K 4.3 4.4 3.9 6.0*  CL 107 104 113* 111  CO2 18*  --  18* 18*  GLUCOSE 196* 188* 184* 152*  BUN 81* 79* 70* 62*  CREATININE 1.93* 1.90* 1.79* 1.89*  CALCIUM 8.7*  --  8.1* 8.4*  MG  --   --  1.9 2.7*  PHOS  --   --  3.8 3.4   GFR: Estimated Creatinine Clearance: 26.3 mL/min (by C-G formula based on SCr of 1.89 mg/dL).  Liver Function Tests:  Recent Labs Lab 08/10/15 1955  AST 88*  ALT 80*  ALKPHOS 108  BILITOT 1.3*  PROT 5.7*  ALBUMIN 3.5    HbA1C: Hgb A1c MFr Bld  Date/Time Value Ref Range Status  05/09/2015 11:57 AM 5.7 4.6 - 6.5 % Final    Comment:    Glycemic Control Guidelines for People with Diabetes:Non Diabetic:  <6%Goal of Therapy: <7%Additional Action Suggested:  >8%   12/29/2014 11:12 AM 6.1 4.6 - 6.5 % Final    Comment:    Glycemic Control Guidelines for People with Diabetes:Non Diabetic:  <6%Goal of Therapy: <7%Additional Action Suggested:  >8%     CBG:  Recent Labs Lab 08/11/15 0318 08/11/15 0830 08/11/15 1059 08/11/15 1636 08/11/15 2246  GLUCAP 179* 169* 165* 142* 156*    Recent Results (from the past 240 hour(s))  TECHNOLOGIST REVIEW     Status: None   Collection Time:  08/09/15 11:17 AM  Result Value Ref Range Status   Technologist Review Few Fragments, Helmets, Ovalos, and Tear Drops  Final  Blood culture (routine x 2)     Status: None (Preliminary result)   Collection Time: 08/10/15  7:55 PM  Result Value Ref Range Status   Specimen Description BLOOD RIGHT HAND  Final   Special Requests BOTTLES DRAWN AEROBIC AND  ANAEROBIC 5CC  Final   Culture NO GROWTH < 24 HOURS  Final   Report Status PENDING  Incomplete  Blood culture (routine x 2)     Status: None (Preliminary result)   Collection Time: 08/10/15  8:05 PM  Result Value Ref Range Status   Specimen Description BLOOD LEFT ANTECUBITAL  Final   Special Requests BOTTLES DRAWN AEROBIC AND ANAEROBIC 5CC  Final   Culture NO GROWTH < 24 HOURS  Final   Report Status PENDING  Incomplete  MRSA PCR Screening     Status: None   Collection Time: 08/11/15  2:17 AM  Result Value Ref Range Status   MRSA by PCR NEGATIVE NEGATIVE Final    Comment:        The GeneXpert MRSA Assay (FDA approved for NASAL specimens only), is one component of a comprehensive MRSA colonization surveillance program. It is not intended to diagnose MRSA infection nor to guide or monitor treatment for MRSA infections.      Scheduled Meds: . cefTRIAXone (ROCEPHIN)  IV  2 g Intravenous Q24H  . insulin aspart  0-15 Units Subcutaneous TID WC  . insulin aspart  0-5 Units Subcutaneous QHS  . pantoprazole  40 mg Oral Daily   Continuous Infusions: . sodium chloride 10 mL/hr at 08/11/15 0909     LOS: 1 day    Cherene Altes, MD [facility] Office  [phone] Pager - Text Page per Shea Evans as per below:  On-Call/Text Page:      [email or web address]      password [email or web address]  If 7PM-7AM, please contact night-coverage [email or web address] Password [email or web address] 08/12/2015, 10:12 AM

## 2015-08-12 NOTE — Evaluation (Signed)
Physical Therapy Evaluation and Discharge Patient Details Name: [name] MRN: [id number] DOB: [birth year] Today's Date: 08/12/2015   History of Present Illness  82 year old male with PMH as below, which is significant for AS s/p TAVR 2016, dCHF, CKD III, DM, lymphoproliferative disorder with severe pancytopenia followed by Dr. Benay Spice and PAF not on anticoagulation. He was recently admitted for UTI/pyelonephritis requiring ICU, peripheral vasopressor, and broad spectrum antibiotics. Culture grew E.coli and he was eventually discharged on Omnicef for 2 weeks total ABX. He recently developed cellulitis of R leg,  Clinical Impression  Patient evaluated by Physical Therapy with no further acute PT needs identified. All education has been completed and the patient has no further questions. See vitals flow sheet for orthostatic BPs (normal). PT is signing off. Thank you for this referral.     Follow Up Recommendations No PT follow up    Equipment Recommendations  None recommended by PT    Recommendations for Other Services       Precautions / Restrictions Precautions Precautions: None Restrictions LLE Weight Bearing: Weight bearing as tolerated      Mobility  Bed Mobility Overal bed mobility: Independent                Transfers Overall transfer level: Independent                  Ambulation/Gait Ambulation/Gait assistance: Supervision Ambulation Distance (Feet): 250 Feet Assistive device: None (hold IV) Gait Pattern/deviations: Step-through pattern;Antalgic   Gait velocity interpretation: at or above normal speed for age/gender General Gait Details: very slightly antalgic; pt denied need for assistive device  Stairs            Wheelchair Mobility    Modified Rankin (Stroke Patients Only)       Balance Overall balance assessment: Independent                                           Pertinent Vitals/Pain Pain Assessment:  0-10 Pain Score: 3  Pain Location: LLE Pain Descriptors / Indicators: Discomfort Pain Intervention(s): Limited activity within patient's tolerance;Monitored during session;Repositioned    Home Living Family/patient expects to be discharged to:: Private residence Living Arrangements: Alone Available Help at Discharge: Family;Available PRN/intermittently (children all out of town) Type of Home: House Home Access: Stairs to enter Entrance Stairs-Rails: Left Entrance Stairs-Number of Steps: 3 Home Layout: One level Home Equipment: Grab bars - tub/shower;Walker - 4 wheels;Cane - single point;Bedside commode      Prior Function Level of Independence: Independent         Comments: mowing grass day before admission     Hand Dominance   Dominant Hand: Right    Extremity/Trunk Assessment   Upper Extremity Assessment: Overall WFL for tasks assessed           Lower Extremity Assessment: Overall WFL for tasks assessed (LLE +edema and erythema; full ROM)      Cervical / Trunk Assessment: Normal  Communication   Communication: HOH  Cognition Arousal/Alertness: Awake/alert Behavior During Therapy: WFL for tasks assessed/performed Overall Cognitive Status: Within Functional Limits for tasks assessed                      General Comments General comments (skin integrity, edema, etc.): Educated on positioning and ankle pumps for edema    Exercises  Assessment/Plan    PT Assessment Patent does not need any further PT services  PT Diagnosis Difficulty walking   PT Problem List    PT Treatment Interventions     PT Goals (Current goals can be found in the Care Plan section) Acute Rehab PT Goals Patient Stated Goal: go home and mow PT Goal Formulation: All assessment and education complete, DC therapy    Frequency     Barriers to discharge        Co-evaluation               End of Session Equipment Utilized During Treatment: Gait  belt Activity Tolerance: Patient tolerated treatment well Patient left: in chair;with call bell/phone within reach;with family/visitor present Nurse Communication: Mobility status (no PT needs)         Time: [id number] PT Time Calculation (min) (ACUTE ONLY): 30 min   Charges:   PT Evaluation $PT Eval Low Complexity: 1 Procedure PT Treatments $Gait Training: 8-22 mins   PT G Codes:        Sean Malinowski 10-Sep-2015, 9:08 AM  Pager [phone]

## 2015-08-12 NOTE — Plan of Care (Signed)
Problem: Fluid Volume: Goal: Ability to maintain a balanced intake and output will improve Outcome: Progressing Discussed with patient need to redraw labs to confirm his potassium level.

## 2015-08-13 LAB — CBC
HCT: 27.8 % — ABNORMAL LOW (ref 39.0–52.0)
Hemoglobin: 8.9 g/dL — ABNORMAL LOW (ref 13.0–17.0)
MCH: 28.5 pg (ref 26.0–34.0)
MCHC: 32 g/dL (ref 30.0–36.0)
MCV: 89.1 fL (ref 78.0–100.0)
Platelets: 20 10*3/uL — CL (ref 150–400)
RBC: 3.12 MIL/uL — ABNORMAL LOW (ref 4.22–5.81)
RDW: 18.1 % — ABNORMAL HIGH (ref 11.5–15.5)
WBC: 1.7 10*3/uL — ABNORMAL LOW (ref 4.0–10.5)

## 2015-08-13 LAB — COMPREHENSIVE METABOLIC PANEL
ALT: 39 U/L (ref 17–63)
AST: 19 U/L (ref 15–41)
Albumin: 3 g/dL — ABNORMAL LOW (ref 3.5–5.0)
Alkaline Phosphatase: 76 U/L (ref 38–126)
Anion gap: 10 (ref 5–15)
BUN: 53 mg/dL — ABNORMAL HIGH (ref 6–20)
CO2: 19 mmol/L — ABNORMAL LOW (ref 22–32)
Calcium: 8.8 mg/dL — ABNORMAL LOW (ref 8.9–10.3)
Chloride: 111 mmol/L (ref 101–111)
Creatinine, Ser: 1.59 mg/dL — ABNORMAL HIGH (ref 0.61–1.24)
GFR calc Af Amer: 42 mL/min — ABNORMAL LOW (ref >60)
GFR calc non Af Amer: 36 mL/min — ABNORMAL LOW (ref >60)
Glucose, Bld: 149 mg/dL — ABNORMAL HIGH (ref 65–99)
Potassium: 4.5 mmol/L (ref 3.5–5.1)
Sodium: 140 mmol/L (ref 135–145)
Total Bilirubin: 0.9 mg/dL (ref 0.3–1.2)
Total Protein: 5.3 g/dL — ABNORMAL LOW (ref 6.5–8.1)

## 2015-08-13 LAB — GLUCOSE, CAPILLARY
Glucose-Capillary: 138 mg/dL — ABNORMAL HIGH (ref 65–99)
Glucose-Capillary: 205 mg/dL — ABNORMAL HIGH (ref 65–99)
Glucose-Capillary: 106 mg/dL — ABNORMAL HIGH (ref 65–99)
Glucose-Capillary: 163 mg/dL — ABNORMAL HIGH (ref 65–99)

## 2015-08-13 MED ORDER — MORPHINE SULFATE (PF) 2 MG/ML IV SOLN: 1.0000 mg | INTRAVENOUS | Status: DC | PRN

## 2015-08-13 MED ORDER — LEVOTHYROXINE SODIUM 25 MCG PO TABS
25.0000 ug | ORAL_TABLET | Freq: Every day | ORAL | Status: DC
  Administered 2015-08-13 – 2015-08-17 (×5): 25 ug via ORAL
  Filled 2015-08-13 (×5): qty 1

## 2015-08-13 MED ORDER — METRONIDAZOLE IN NACL 5-0.79 MG/ML-% IV SOLN
500.0000 mg | Freq: Three times a day (TID) | INTRAVENOUS | Status: DC
  Administered 2015-08-13 – 2015-08-15 (×6): 500 mg via INTRAVENOUS
  Filled 2015-08-13 (×8): qty 100

## 2015-08-13 MED ORDER — DEXTROSE 5 % IV SOLN
1.0000 g | INTRAVENOUS | Status: DC
  Administered 2015-08-13 – 2015-08-14 (×2): 1 g via INTRAVENOUS
  Filled 2015-08-13 (×3): qty 1

## 2015-08-13 MED ORDER — TRAMADOL HCL 50 MG PO TABS
50.0000 mg | ORAL_TABLET | Freq: Three times a day (TID) | ORAL | Status: DC | PRN
  Administered 2015-08-13: 50 mg via ORAL
  Filled 2015-08-13: qty 1

## 2015-08-13 MED ORDER — VANCOMYCIN HCL IN DEXTROSE 1-5 GM/200ML-% IV SOLN
1000.0000 mg | INTRAVENOUS | Status: DC
  Administered 2015-08-13 – 2015-08-14 (×2): 1000 mg via INTRAVENOUS
  Filled 2015-08-13 (×3): qty 200

## 2015-08-13 MED ORDER — OXYCODONE HCL 5 MG PO TABS: 5.0000 mg | ORAL_TABLET | ORAL | Status: DC | PRN

## 2015-08-13 NOTE — Progress Notes (Signed)
[name]  [id number] DOB: August 25, 1922 DOA: 08/10/2015 PCP: Binnie Rail, MD    Brief Narrative:  82 year old male with Hx of AS s/p TAVR 2016, dCHF, CKD III, DM, lymphoproliferative disorder with severe pancytopenia followed by Dr. Benay Spice, and PAF not on anticoagulation who was recently admitted for UTI/pyelonephritis requiring ICU, peripheral vasopressor, and broad spectrum antibiotics. Cultures grew E.coli and he was eventually discharged on Omnicef for 2 weeks total ABX. He later developed cellulitis of the R leg and was treated as an outpatient. 7/25 he was taken electively to OR for excision of R ear lesion, sutures remain in place.   7/29 he was mowing his lawn when he developed acute onset L leg pain. He thinks that maybe a branch hit it. LLE pain and edema progressed since that time, and eventually erythema developed. 8/2 he had some chills and several episodes of vomiting. He presented to PCP for these complaints. Based on his symptoms and appearance of his leg, with new onset hypotension and orthostasis, he was referred to the ED with concerns of sepsis.   In the ED LLE pain persisted and hypotension was confirmed. He remained hypotensive despite 30cc/kg IVF bolus. He was started on norepinephrine for BP support and PCCM called for admission. Pertinent labs included CO2 18, SCr 1.93 (close to baseline), AST/ALT mildly elevated, Lactic acid 1.9, glucose 196.  Subjective: [name] left lower extremity has become worse today in his opinion.  He states that leg feels tighter and "hotter."  He states it began to feel worse yesterday evening and kept him awake for a good portion.  He denies chest pain shortness breath nausea vomiting or abdominal pain.  Assessment & Plan:  Septic shock due to LLE cellulitis Shock resolved - attempted to narrow antibiotics yesterday but leg has worsened - broaden antibiotics -  check MRI of leg to assure no  abscess - no evidence of compartment syndrome at this time but the leg is definitely warmer to touch and more swollen today   Mild transaminitis  Likely shock liver - resolved in f/u   Parox AFib CHADS2-Vasc at least 4 - not on anticoag because of pancytopenia - HR controlled   QTc prolongation - chronic  Follow on tele   Chronic diastolic CHF Baseline wgt appears to be ~82kg - no evidence of volume overload at this time  Filed Weights   08/11/15 1400 08/12/15 0500 08/13/15 0411  Weight: 83.8 kg (184 lb 11.9 oz) 82.5 kg (181 lb 14.1 oz) 81.3 kg (179 lb 3.7 oz)    Hx of HTN BP currently remains lower than baseline - cont to hold BP meds and follow - is not presently orthostatic   AoS s/p TAVR 2016 No clinical evidence to suggest valve dysfxn  CKD stage 3 Baseline crt 1.9-2.2 - stable at present in lower range of reported baseline   Lymphoproliferative disorder - chronic pancytopenia  plt count appears to be trending downward - I suspect this is due to consumption, w/ signif vasculitis type change to L LE - keep in mind this could be due to B lactam abx - follow trend - no evidence of acute blood loss at this time   DM CBG not ideally controlled - adjust treatment and follow   Hypothyroid cont home synthroid   DVT prophylaxis: SCD R LE  Code Status: DNR - NO CODE Family Communication: spoke w/ daughter at bedside  Disposition Plan:  SDU  Consultants:  PCCM  Procedures: none  Antimicrobials:  Clinda 8/2 Ceftriaxone 8/2 > Vanco 8/2 > 8/3  Objective: Blood pressure 127/78, pulse (!) 102, temperature 97.9 F (36.6 C), temperature source Oral, resp. rate 20, height 5\' 10"  (1.778 m), weight 81.3 kg (179 lb 3.7 oz), SpO2 100 %.  Intake/Output Summary (Last 24 hours) at 08/13/15 1631 Last data filed at 08/13/15 1500  Gross per 24 hour  Intake          1470.17 ml  Output             1350 ml  Net           120.17 ml   Filed Weights   08/11/15 1400 08/12/15 0500  08/13/15 0411  Weight: 83.8 kg (184 lb 11.9 oz) 82.5 kg (181 lb 14.1 oz) 81.3 kg (179 lb 3.7 oz)    Examination: General: No acute respiratory distress Lungs: Clear to auscultation bilaterally - no wheezes or crackles Cardiovascular: Regular rate w/ irreg rhythm without murmur gallop or rub normal S1 and S2 Abdomen: Nontender, nondistended, soft, bowel sounds positive, no rebound, no ascites, no appreciable mass Extremities: No significant cyanosis, clubbing, or edema R LE - L LE w/ leukocytoclastic type discoloration circumferentially from ankle to knee w/ 2+ edema w/o purulent d/c or fluctuance but with appreciable calor    CBC:  Recent Labs Lab 08/09/15 1117  08/10/15 1955 08/10/15 2044 08/11/15 0218 08/12/15 0427 08/12/15 2051 08/13/15 0536  WBC 3.6*  --  7.2  --  6.7 2.1* 1.9* 1.7*  NEUTROABS 2.9  --  6.4  --   --   --   --   --   HGB 10.8*  < > 10.5* 10.9* 10.2* 8.8* 8.7* 8.9*  HCT 32.2*  < > 31.8* 32.0* 31.2* 25.5* 27.4* 27.8*  MCV 86.3  --  86.9  --  86.9 85.9 89.0 89.1  PLT 29*  --  22*  --  28* 19* 27* 20*  < > = values in this interval not displayed.   Basic Metabolic Panel:  Recent Labs Lab 08/10/15 1955 08/10/15 2044 08/11/15 0218 08/12/15 0427 08/12/15 2051 08/13/15 0536  NA 135 136 136 138 139 140  K 4.3 4.4 3.9 6.0* 4.8 4.5  CL 107 104 113* 111 111 111  CO2 18*  --  18* 18* 21* 19*  GLUCOSE 196* 188* 184* 152* 202* 149*  BUN 81* 79* 70* 62* 57* 53*  CREATININE 1.93* 1.90* 1.79* 1.89* 1.80* 1.59*  CALCIUM 8.7*  --  8.1* 8.4* 8.6* 8.8*  MG  --   --  1.9 2.7*  --   --   PHOS  --   --  3.8 3.4  --   --    GFR: Estimated Creatinine Clearance: 31.2 mL/min (by C-G formula based on SCr of 1.59 mg/dL).  Liver Function Tests:  Recent Labs Lab 08/10/15 1955 08/12/15 2051 08/13/15 0536  AST 88* 21 19  ALT 80* 42 39  ALKPHOS 108 83 76  BILITOT 1.3* 0.7 0.9  PROT 5.7* 5.3* 5.3*  ALBUMIN 3.5 2.9* 3.0*    HbA1C: Hgb A1c MFr Bld  Date/Time Value  Ref Range Status  05/09/2015 11:57 AM 5.7 4.6 - 6.5 % Final    Comment:    Glycemic Control Guidelines for People with Diabetes:Non Diabetic:  <6%Goal of Therapy: <7%Additional Action Suggested:  >8%   12/29/2014 11:12 AM 6.1 4.6 - 6.5 % Final    Comment:  Glycemic Control Guidelines for People with Diabetes:Non Diabetic:  <6%Goal of Therapy: <7%Additional Action Suggested:  >8%     CBG:  Recent Labs Lab 08/11/15 2246 08/12/15 1622 08/12/15 2120 08/13/15 0752 08/13/15 1305  GLUCAP 156* 174* 202* 138* 205*    Recent Results (from the past 240 hour(s))  TECHNOLOGIST REVIEW     Status: None   Collection Time: 08/09/15 11:17 AM  Result Value Ref Range Status   Technologist Review Few Fragments, Helmets, Ovalos, and Tear Drops  Final  Blood culture (routine x 2)     Status: None (Preliminary result)   Collection Time: 08/10/15  7:55 PM  Result Value Ref Range Status   Specimen Description BLOOD RIGHT HAND  Final   Special Requests BOTTLES DRAWN AEROBIC AND ANAEROBIC 5CC  Final   Culture NO GROWTH 2 DAYS  Final   Report Status PENDING  Incomplete  Blood culture (routine x 2)     Status: None (Preliminary result)   Collection Time: 08/10/15  8:05 PM  Result Value Ref Range Status   Specimen Description BLOOD LEFT ANTECUBITAL  Final   Special Requests BOTTLES DRAWN AEROBIC AND ANAEROBIC 5CC  Final   Culture NO GROWTH 2 DAYS  Final   Report Status PENDING  Incomplete  MRSA PCR Screening     Status: None   Collection Time: 08/11/15  2:17 AM  Result Value Ref Range Status   MRSA by PCR NEGATIVE NEGATIVE Final    Comment:        The GeneXpert MRSA Assay (FDA approved for NASAL specimens only), is one component of a comprehensive MRSA colonization surveillance program. It is not intended to diagnose MRSA infection nor to guide or monitor treatment for MRSA infections.      Scheduled Meds: . allopurinol  100 mg Oral Daily  . amiodarone  200 mg Oral Daily  . cefTRIAXone  (ROCEPHIN)  IV  2 g Intravenous Q24H  . insulin aspart  0-15 Units Subcutaneous TID WC  . insulin aspart  0-5 Units Subcutaneous QHS  . levothyroxine  25 mcg Oral QAC breakfast  . multivitamin with minerals  1 tablet Oral Daily  . pantoprazole  40 mg Oral Daily   Continuous Infusions: . sodium chloride 10 mL/hr at 08/13/15 1500     LOS: 2 days    Cherene Altes, MD [facility] Office  [phone] Pager - Text Page per Shea Evans as per below:  On-Call/Text Page:      [email or web address]      password [email or web address]  If 7PM-7AM, please contact night-coverage [email or web address] Password [email or web address] 08/13/2015, 4:31 PM

## 2015-08-13 NOTE — Plan of Care (Signed)
Problem: Pain Managment: Goal: General experience of comfort will improve Outcome: Progressing Discussed with patient the importance of a goodnights rest and of taking pain pills when hurting. Patient gave return demonstration of understanding.

## 2015-08-13 NOTE — Progress Notes (Signed)
Pharmacy Antibiotic Note  [name] is a 82 y.o. male admitted on 08/10/2015 with cellulitis. Patient is being broadened to cefepime flagyl and vanc. Avoiding lvq with prolonged qtc.  Plan: vancomycin 1 g q24h goal 10 - 15 Flagyl 500 q8h Cefepime 1 g q24h  Height: 5\' 10"  (177.8 cm) Weight: 179 lb 3.7 oz (81.3 kg) IBW/kg (Calculated) : 73  Temp (24hrs), Avg:98.2 F (36.8 C), Min:97.9 F (36.6 C), Max:98.4 F (36.9 C)   Recent Labs Lab 08/10/15 1955 08/10/15 2014 08/10/15 2044 08/10/15 2251 08/11/15 0218 08/12/15 0427 08/12/15 2051 08/13/15 0536  WBC 7.2  --   --   --  6.7 2.1* 1.9* 1.7*  CREATININE 1.93*  --  1.90*  --  1.79* 1.89* 1.80* 1.59*  LATICACIDVEN  --  1.99*  --  1.56  --   --   --   --     Estimated Creatinine Clearance: 31.2 mL/min (by C-G formula based on SCr of 1.59 mg/dL).    Allergies  Allergen Reactions  . Penicillins Other (See Comments)    Does not remember reaction (~year 1950) Has patient had a PCN reaction causing immediate rash, facial/tongue/throat swelling, SOB or lightheadedness with hypotension:YES Has patient had a PCN reaction causing severe rash involving mucus membranes or skin necrosis: NO Has patient had a PCN reaction that required hospitalization NO Has patient had a PCN reaction occurring within the last 10 years: NO If all of the above answers are "NO", then may proceed with Cephalosporin use.  [name] Kitchen Neomycin-Bacitracin Zn-Polymyx Rash and Other (See Comments)    ? Reaction (thinks he remembers redness)    Levester Fresh, PharmD, BCPS, [facility] Clinical Pharmacist Pager [phone] 08/13/2015 5:18 PM

## 2015-08-14 ENCOUNTER — Inpatient Hospital Stay (HOSPITAL_COMMUNITY): Payer: Commercial Managed Care - HMO

## 2015-08-14 LAB — GLUCOSE, CAPILLARY
GLUCOSE-CAPILLARY: 169 mg/dL — AB (ref 65–99)
GLUCOSE-CAPILLARY: 146 mg/dL — AB (ref 65–99)
GLUCOSE-CAPILLARY: 111 mg/dL — AB (ref 65–99)
Glucose-Capillary: 215 mg/dL — ABNORMAL HIGH (ref 65–99)
Glucose-Capillary: 141 mg/dL — ABNORMAL HIGH (ref 65–99)

## 2015-08-14 LAB — CBC
HEMATOCRIT: 25.4 % — AB (ref 39.0–52.0)
Hemoglobin: 8.2 g/dL — ABNORMAL LOW (ref 13.0–17.0)
MCH: 28.7 pg (ref 26.0–34.0)
MCHC: 32.3 g/dL (ref 30.0–36.0)
MCV: 88.8 fL (ref 78.0–100.0)
PLATELETS: 20 10*3/uL — AB (ref 150–400)
RBC: 2.86 MIL/uL — ABNORMAL LOW (ref 4.22–5.81)
RDW: 17.6 % — AB (ref 11.5–15.5)
WBC: 1.6 10*3/uL — ABNORMAL LOW (ref 4.0–10.5)

## 2015-08-14 LAB — COMPREHENSIVE METABOLIC PANEL
ALT: 34 U/L (ref 17–63)
AST: 22 U/L (ref 15–41)
Albumin: 2.9 g/dL — ABNORMAL LOW (ref 3.5–5.0)
Alkaline Phosphatase: 76 U/L (ref 38–126)
Anion gap: 9 (ref 5–15)
BILIRUBIN TOTAL: 0.8 mg/dL (ref 0.3–1.2)
BUN: 50 mg/dL — AB (ref 6–20)
CHLORIDE: 111 mmol/L (ref 101–111)
CO2: 18 mmol/L — ABNORMAL LOW (ref 22–32)
CREATININE: 1.57 mg/dL — AB (ref 0.61–1.24)
Calcium: 8.5 mg/dL — ABNORMAL LOW (ref 8.9–10.3)
GFR, EST AFRICAN AMERICAN: 43 mL/min — AB (ref >60)
GFR, EST NON AFRICAN AMERICAN: 37 mL/min — AB (ref >60)
Glucose, Bld: 156 mg/dL — ABNORMAL HIGH (ref 65–99)
POTASSIUM: 4.9 mmol/L (ref 3.5–5.1)
Sodium: 138 mmol/L (ref 135–145)
TOTAL PROTEIN: 5.1 g/dL — AB (ref 6.5–8.1)

## 2015-08-14 MED ORDER — CLONAZEPAM 0.5 MG PO TABS: 0.2500 mg | ORAL_TABLET | Freq: Two times a day (BID) | ORAL | Status: DC | PRN

## 2015-08-14 NOTE — Progress Notes (Signed)
Report called to [place] bed 07 spoke to [employer]

## 2015-08-14 NOTE — Progress Notes (Signed)
[place] TEAM 1 - Stepdown/ICU TEAM  [name]  [id number] DOB: 04-07-1922 DOA: 08/10/2015 PCP: Binnie Rail, MD    Brief Narrative:  82 year old male with Hx of AS s/p TAVR 2016, dCHF, CKD III, DM, lymphoproliferative disorder with severe pancytopenia followed by Dr. Benay Spice, and PAF not on anticoagulation who was recently admitted for UTI/pyelonephritis requiring ICU, peripheral vasopressor, and broad spectrum antibiotics. Cultures grew E.coli and he was eventually discharged on Omnicef for 2 weeks total ABX. He later developed cellulitis of the R leg and was treated as an outpatient. 7/25 he was taken electively to OR for excision of R ear lesion, sutures remain in place.   7/29 he was mowing his lawn when he developed acute onset L leg pain. He thinks that maybe a branch hit it. LLE pain and edema progressed since that time, and eventually erythema developed. 8/2 he had some chills and several episodes of vomiting. He presented to PCP for these complaints. Based on his symptoms and appearance of his leg, with new onset hypotension and orthostasis, he was referred to the ED with concerns of sepsis.   In the ED LLE pain persisted and hypotension was confirmed. He remained hypotensive despite 30cc/kg IVF bolus. He was started on norepinephrine for BP support and PCCM called for admission. Pertinent labs included CO2 18, SCr 1.93 (close to baseline), AST/ALT mildly elevated, Lactic acid 1.9, glucose 196.  Subjective: The patient reports that his leg feels better today.  It is less warm and swollen.  He did not sleep well last night because of discomfort and noise on the unit.  He denies chest pain shortness of breath fevers chills nausea or vomiting.  Assessment & Plan:  Septic shock due to LLE cellulitis Shock resolved - attempted to narrow antibiotics 8/4 but leg worsened - broadened antibiotics on 8/5 with significant improvement on exam overnight-  MRI of leg to assure no abscess  pending  Mild transaminitis  Likely shock liver - resolved in f/u   Parox AFib CHADS2-Vasc at least 4 - not on anticoag because of pancytopenia - HR controlled   QTc prolongation - chronic  Chronic and stable - avoiding QT prolonging medications  Chronic diastolic CHF Baseline wgt appears to be ~82kg - no evidence of volume overload at this time  [facility] Weights   08/12/15 0500 08/13/15 0411 08/14/15 0322  Weight: 82.5 kg (181 lb 14.1 oz) 81.3 kg (179 lb 3.7 oz) 82.1 kg (181 lb)    Hx of HTN BP currently remains lower than baseline - cont to hold BP meds and follow - is no longer orthostatic   AoS s/p TAVR 2016 No clinical evidence to suggest valve dysfxn  CKD stage 3 Baseline crt 1.9-2.2 - stable at present in lower range than reported baseline   Lymphoproliferative disorder - chronic pancytopenia  plt count holding steady around 20 - I suspect this is due to consumption, w/ signif vasculitis type change to L LE - must continue to follow trend - no evidence of acute blood loss at this time   DM Follow CBG w/o change in tx today   Hypothyroid cont home synthroid   DVT prophylaxis: SCD R LE  Code Status: DNR - NO CODE Family Communication: spoke w/ daughter at bedside  Disposition Plan: Transfer to medical bed - continue very broad empiric antibiotic coverage - follow-up MRI - ambulate - possible discharge 24-48 hours but we'll need to determine adequate oral antibiotic regimen  Consultants:  PCCM  Procedures: none  Antimicrobials:  Clinda 8/2 Ceftriaxone 8/2 > 8/4 Vanco 8/2 > 8/3 + 8/5 > Cefepime 8/5 > Flagyl 8/5 >  Objective: Blood pressure 112/63, pulse 87, temperature 97.9 F (36.6 C), temperature source Oral, resp. rate 13, height 5\' 10"  (1.778 m), weight 82.1 kg (181 lb), SpO2 100 %.  Intake/Output Summary (Last 24 hours) at 08/14/15 1020 Last data filed at 08/14/15 0906  Gross per 24 hour  Intake           2759.5 ml  Output             1625 ml  Net            1134.5 ml   Filed Weights   08/12/15 0500 08/13/15 0411 08/14/15 0322  Weight: 82.5 kg (181 lb 14.1 oz) 81.3 kg (179 lb 3.7 oz) 82.1 kg (181 lb)    Examination: General: No acute respiratory distress Lungs: Clear to auscultation bilaterally without wheeze or crackles Cardiovascular: Regular rate w/ irreg rhythm without murmur gallop or rub  Abdomen: Nontender, nondistended, soft, bowel sounds positive, no rebound, no ascites, no appreciable mass Extremities: No significant cyanosis, clubbing, or edema R LE - L LE w/ leukocytoclastic type discoloration circumferentially from ankle to knee w/ 1+ edema w/o purulent d/c or fluctuance and today without calor   CBC:  Recent Labs Lab 08/09/15 1117  08/10/15 1955  08/11/15 0218 08/12/15 0427 08/12/15 2051 08/13/15 0536 08/14/15 0325  WBC 3.6*  < > 7.2  --  6.7 2.1* 1.9* 1.7* 1.6*  NEUTROABS 2.9  --  6.4  --   --   --   --   --   --   HGB 10.8*  --  10.5*  < > 10.2* 8.8* 8.7* 8.9* 8.2*  HCT 32.2*  --  31.8*  < > 31.2* 25.5* 27.4* 27.8* 25.4*  MCV 86.3  < > 86.9  --  86.9 85.9 89.0 89.1 88.8  PLT 29*  < > 22*  --  28* 19* 27* 20* 20*  < > = values in this interval not displayed.   Basic Metabolic Panel:  Recent Labs Lab 08/11/15 0218 08/12/15 0427 08/12/15 2051 08/13/15 0536 08/14/15 0325  NA 136 138 139 140 138  K 3.9 6.0* 4.8 4.5 4.9  CL 113* 111 111 111 111  CO2 18* 18* 21* 19* 18*  GLUCOSE 184* 152* 202* 149* 156*  BUN 70* 62* 57* 53* 50*  CREATININE 1.79* 1.89* 1.80* 1.59* 1.57*  CALCIUM 8.1* 8.4* 8.6* 8.8* 8.5*  MG 1.9 2.7*  --   --   --   PHOS 3.8 3.4  --   --   --    GFR: Estimated Creatinine Clearance: 31.6 mL/min (by C-G formula based on SCr of 1.57 mg/dL).  Liver Function Tests:  Recent Labs Lab 08/10/15 1955 08/12/15 2051 08/13/15 0536 08/14/15 0325  AST 88* 21 19 22   ALT 80* 42 39 34  ALKPHOS 108 83 76 76  BILITOT 1.3* 0.7 0.9 0.8  PROT 5.7* 5.3* 5.3* 5.1*  ALBUMIN 3.5 2.9* 3.0* 2.9*     HbA1C: Hgb A1c MFr Bld  Date/Time Value Ref Range Status  05/09/2015 11:57 AM 5.7 4.6 - 6.5 % Final    Comment:    Glycemic Control Guidelines for People with Diabetes:Non Diabetic:  <6%Goal of Therapy: <7%Additional Action Suggested:  >8%   12/29/2014 11:12 AM 6.1 4.6 - 6.5 % Final    Comment:    Glycemic  Control Guidelines for People with Diabetes:Non Diabetic:  <6%Goal of Therapy: <7%Additional Action Suggested:  >8%     CBG:  Recent Labs Lab 08/13/15 0752 08/13/15 1305 08/13/15 1723 08/13/15 2110 08/14/15 0733  GLUCAP 138* 205* 106* 163* 215*    Recent Results (from the past 240 hour(s))  TECHNOLOGIST REVIEW     Status: None   Collection Time: 08/09/15 11:17 AM  Result Value Ref Range Status   Technologist Review Few Fragments, Helmets, Ovalos, and Tear Drops  Final  Blood culture (routine x 2)     Status: None (Preliminary result)   Collection Time: 08/10/15  7:55 PM  Result Value Ref Range Status   Specimen Description BLOOD RIGHT HAND  Final   Special Requests BOTTLES DRAWN AEROBIC AND ANAEROBIC 5CC  Final   Culture NO GROWTH 3 DAYS  Final   Report Status PENDING  Incomplete  Blood culture (routine x 2)     Status: None (Preliminary result)   Collection Time: 08/10/15  8:05 PM  Result Value Ref Range Status   Specimen Description BLOOD LEFT ANTECUBITAL  Final   Special Requests BOTTLES DRAWN AEROBIC AND ANAEROBIC 5CC  Final   Culture NO GROWTH 3 DAYS  Final   Report Status PENDING  Incomplete  MRSA PCR Screening     Status: None   Collection Time: 08/11/15  2:17 AM  Result Value Ref Range Status   MRSA by PCR NEGATIVE NEGATIVE Final    Comment:        The GeneXpert MRSA Assay (FDA approved for NASAL specimens only), is one component of a comprehensive MRSA colonization surveillance program. It is not intended to diagnose MRSA infection nor to guide or monitor treatment for MRSA infections.      Scheduled Meds: . allopurinol  100 mg Oral Daily   . amiodarone  200 mg Oral Daily  . ceFEPime (MAXIPIME) IV  1 g Intravenous Q24H  . insulin aspart  0-15 Units Subcutaneous TID WC  . insulin aspart  0-5 Units Subcutaneous QHS  . levothyroxine  25 mcg Oral QAC breakfast  . metronidazole  500 mg Intravenous Q8H  . multivitamin with minerals  1 tablet Oral Daily  . pantoprazole  40 mg Oral Daily  . vancomycin  1,000 mg Intravenous Q24H   Continuous Infusions: . sodium chloride 100 mL/hr at 08/13/15 1900     LOS: 3 days    Cherene Altes, MD [facility] Office  [phone] Pager - Text Page per Shea Evans as per below:  On-Call/Text Page:      [email or web address]      password [email or web address]  If 7PM-7AM, please contact night-coverage [email or web address] Password [email or web address] 08/14/2015, 10:20 AM

## 2015-08-15 ENCOUNTER — Ambulatory Visit (HOSPITAL_COMMUNITY): Payer: Commercial Managed Care - HMO

## 2015-08-15 LAB — GLUCOSE, CAPILLARY
GLUCOSE-CAPILLARY: 102 mg/dL — AB (ref 65–99)
Glucose-Capillary: 145 mg/dL — ABNORMAL HIGH (ref 65–99)
Glucose-Capillary: 129 mg/dL — ABNORMAL HIGH (ref 65–99)
Glucose-Capillary: 125 mg/dL — ABNORMAL HIGH (ref 65–99)
Glucose-Capillary: 180 mg/dL — ABNORMAL HIGH (ref 65–99)

## 2015-08-15 LAB — CULTURE, BLOOD (ROUTINE X 2)
CULTURE: NO GROWTH
CULTURE: NO GROWTH

## 2015-08-15 LAB — CBC
HEMATOCRIT: 26.8 % — AB (ref 39.0–52.0)
HEMOGLOBIN: 8.6 g/dL — AB (ref 13.0–17.0)
MCH: 28.6 pg (ref 26.0–34.0)
MCHC: 32.1 g/dL (ref 30.0–36.0)
MCV: 89 fL (ref 78.0–100.0)
PLATELETS: 23 10*3/uL — AB (ref 150–400)
RBC: 3.01 MIL/uL — AB (ref 4.22–5.81)
RDW: 17.4 % — ABNORMAL HIGH (ref 11.5–15.5)
WBC: 1.7 10*3/uL — AB (ref 4.0–10.5)

## 2015-08-15 LAB — BASIC METABOLIC PANEL
ANION GAP: 7 (ref 5–15)
BUN: 41 mg/dL — ABNORMAL HIGH (ref 6–20)
CHLORIDE: 113 mmol/L — AB (ref 101–111)
CO2: 17 mmol/L — ABNORMAL LOW (ref 22–32)
Calcium: 8.5 mg/dL — ABNORMAL LOW (ref 8.9–10.3)
Creatinine, Ser: 1.58 mg/dL — ABNORMAL HIGH (ref 0.61–1.24)
GFR calc Af Amer: 42 mL/min — ABNORMAL LOW (ref >60)
GFR, EST NON AFRICAN AMERICAN: 37 mL/min — AB (ref >60)
GLUCOSE: 144 mg/dL — AB (ref 65–99)
POTASSIUM: 5.3 mmol/L — AB (ref 3.5–5.1)
Sodium: 137 mmol/L (ref 135–145)

## 2015-08-15 MED ORDER — LOPERAMIDE HCL 2 MG PO CAPS
2.0000 mg | ORAL_CAPSULE | ORAL | Status: DC | PRN
  Administered 2015-08-15: 2 mg via ORAL
  Filled 2015-08-15: qty 1

## 2015-08-15 MED ORDER — FUROSEMIDE 80 MG PO TABS: 80.0000 mg | ORAL_TABLET | Freq: Two times a day (BID) | ORAL | Status: DC

## 2015-08-15 MED ORDER — FUROSEMIDE 80 MG PO TABS
80.0000 mg | ORAL_TABLET | Freq: Two times a day (BID) | ORAL | Status: DC
  Administered 2015-08-15 – 2015-08-17 (×4): 80 mg via ORAL
  Filled 2015-08-15 (×4): qty 1

## 2015-08-15 MED ORDER — CEPHALEXIN 500 MG PO CAPS
500.0000 mg | ORAL_CAPSULE | Freq: Two times a day (BID) | ORAL | Status: DC
  Administered 2015-08-15 – 2015-08-17 (×4): 500 mg via ORAL
  Filled 2015-08-15 (×4): qty 1

## 2015-08-15 NOTE — Care Management Important Message (Signed)
Important Message  Patient Details  Name: [name] MRN: [id number] Date of Birth: 09-Dec-1922   Medicare Important Message Given:  Yes    Rie Mcneil, Leroy Sea 08/15/2015, 2:06 PM

## 2015-08-15 NOTE — Progress Notes (Signed)
[place] TEAM 1 - Stepdown/ICU TEAM  [name]  [id number] DOB: 1922/02/18 DOA: 08/10/2015 PCP: Binnie Rail, MD    Brief Narrative:  82 year old male with Hx of AS s/p TAVR 2016, dCHF, CKD III, DM, lymphoproliferative disorder with severe pancytopenia followed by Dr. Benay Spice, and PAF not on anticoagulation who was recently admitted for UTI/pyelonephritis requiring ICU, peripheral vasopressor, and broad spectrum antibiotics. Cultures grew E.coli and he was eventually discharged on Omnicef for 2 weeks total ABX. He later developed cellulitis of the R leg and was treated as an outpatient. 7/25 he was taken electively to OR for excision of R ear lesion, sutures remain in place.   7/29 he was mowing his lawn when he developed acute onset L leg pain. He thinks that maybe a branch hit it. LLE pain and edema progressed since that time, and eventually erythema developed. 8/2 he had some chills and several episodes of vomiting. He presented to PCP for these complaints. Based on his symptoms and appearance of his leg, with new onset hypotension and orthostasis, he was referred to the ED with concerns of sepsis.   In the ED LLE pain persisted and hypotension was confirmed. He remained hypotensive despite 30cc/kg IVF bolus. He was started on norepinephrine for BP support and PCCM called for admission. Pertinent labs included CO2 18, SCr 1.93 (close to baseline), AST/ALT mildly elevated, Lactic acid 1.9, glucose 196.  Subjective: The pt has develped very loose diarrhea.  No abdom cramps, malodorous stool, or discolored stool.  No cp or sob.  He feels his L LE continues to improve, though he did have 1 or 2 episodes last evening when the leg became warm and more red again.    Assessment & Plan:  Septic shock due to LLE cellulitis Shock resolved - attempted to narrow antibiotics 8/4 but leg worsened - broadened antibiotics on 8/5 with significant improvement on exam -  MRI of leg revealed no abscess  or myositis - will attempt to obtain doppler to r/o DVT, but pain will make it difficult to tolerate - given waxing and waning, will change to oral abx today but will need to watch another 48hrs to assure his leg does not worsen again.    Mild transaminitis  Likely shock liver - resolved in f/u   Parox AFib CHADS2-Vasc at least 4 - not on anticoag because of pancytopenia - HR controlled   QTc prolongation - chronic  Chronic and stable - avoiding QT prolonging medications as able   Chronic diastolic CHF Baseline wgt appears to be ~82kg - no evidence of volume overload at this time - gently diurese today and stop IVF  [facility] Weights   08/13/15 0411 08/14/15 0322 08/15/15 0500  Weight: 81.3 kg (179 lb 3.7 oz) 82.1 kg (181 lb) 87.3 kg (192 lb 6.4 oz)    Hx of HTN BP presently well controlled    AoS s/p TAVR 2016 No clinical evidence to suggest valve dysfxn  CKD stage 3 Baseline crt 1.9-2.2 - stable at present in lower range than reported baseline   Lymphoproliferative disorder - chronic pancytopenia  plt count holding steady around 20 - I suspect this is due to consumption, w/ signif vasculitis type change to L LE - must continue to follow trend - no evidence of acute blood loss at this time   DM CBG controlled   Hypothyroid cont home synthroid   DVT prophylaxis: SCD R LE  Code Status: DNR - NO CODE  Family Communication: spoke w/ daughter at bedside  Disposition Plan: ambulate - possible discharge Wed but we'll need to determine adequate oral antibiotic regimen  Consultants:  PCCM  Procedures: none  Antimicrobials:  Clinda 8/2 Ceftriaxone 8/2 > 8/4 Vanco 8/2 > 8/3 + 8/5 > Cefepime 8/5 > Flagyl 8/5 >  Objective: Blood pressure 125/77, pulse 91, temperature 98.6 F (37 C), temperature source Oral, resp. rate 16, height 5\' 10"  (1.778 m), weight 87.3 kg (192 lb 6.4 oz), SpO2 100 %.  Intake/Output Summary (Last 24 hours) at 08/15/15 1419 Last data filed at 08/15/15  1412  Gross per 24 hour  Intake             1330 ml  Output              550 ml  Net              780 ml   Filed Weights   08/13/15 0411 08/14/15 0322 08/15/15 0500  Weight: 81.3 kg (179 lb 3.7 oz) 82.1 kg (181 lb) 87.3 kg (192 lb 6.4 oz)    Examination: General: No acute respiratory distress Lungs: Clear to auscultation bilaterally without wheeze or crackles Cardiovascular: Regular rate w/ irreg rhythm without murmur  Abdomen: Nontender, nondistended, soft, bowel sounds positive, no rebound, no ascites, no appreciable mass Extremities: No significant cyanosis, clubbing, or edema R LE - L LE w/ leukocytoclastic type discoloration circumferentially ankle to knee w/ 1+ edema w/o purulent d/c or fluctuance or calor - LLE remains tender to touch   CBC:  Recent Labs Lab 08/09/15 1117 08/10/15 1955  08/12/15 0427 08/12/15 2051 08/13/15 0536 08/14/15 0325 08/15/15 0333  WBC 3.6* 7.2  < > 2.1* 1.9* 1.7* 1.6* 1.7*  NEUTROABS 2.9 6.4  --   --   --   --   --   --   HGB 10.8* 10.5*  < > 8.8* 8.7* 8.9* 8.2* 8.6*  HCT 32.2* 31.8*  < > 25.5* 27.4* 27.8* 25.4* 26.8*  MCV 86.3 86.9  < > 85.9 89.0 89.1 88.8 89.0  PLT 29* 22*  < > 19* 27* 20* 20* 23*  < > = values in this interval not displayed.   Basic Metabolic Panel:  Recent Labs Lab 08/11/15 0218 08/12/15 0427 08/12/15 2051 08/13/15 0536 08/14/15 0325 08/15/15 0333  NA 136 138 139 140 138 137  K 3.9 6.0* 4.8 4.5 4.9 5.3*  CL 113* 111 111 111 111 113*  CO2 18* 18* 21* 19* 18* 17*  GLUCOSE 184* 152* 202* 149* 156* 144*  BUN 70* 62* 57* 53* 50* 41*  CREATININE 1.79* 1.89* 1.80* 1.59* 1.57* 1.58*  CALCIUM 8.1* 8.4* 8.6* 8.8* 8.5* 8.5*  MG 1.9 2.7*  --   --   --   --   PHOS 3.8 3.4  --   --   --   --    GFR: Estimated Creatinine Clearance: 31.4 mL/min (by C-G formula based on SCr of 1.58 mg/dL).  Liver Function Tests:  Recent Labs Lab 08/10/15 1955 08/12/15 2051 08/13/15 0536 08/14/15 0325  AST 88* 21 19 22   ALT 80*  42 39 34  ALKPHOS 108 83 76 76  BILITOT 1.3* 0.7 0.9 0.8  PROT 5.7* 5.3* 5.3* 5.1*  ALBUMIN 3.5 2.9* 3.0* 2.9*    HbA1C: Hgb A1c MFr Bld  Date/Time Value Ref Range Status  05/09/2015 11:57 AM 5.7 4.6 - 6.5 % Final    Comment:    Glycemic Control Guidelines for  People with Diabetes:Non Diabetic:  <6%Goal of Therapy: <7%Additional Action Suggested:  >8%   12/29/2014 11:12 AM 6.1 4.6 - 6.5 % Final    Comment:    Glycemic Control Guidelines for People with Diabetes:Non Diabetic:  <6%Goal of Therapy: <7%Additional Action Suggested:  >8%     CBG:  Recent Labs Lab 08/14/15 1724 08/14/15 2125 08/15/15 0608 08/15/15 0754 08/15/15 1302  GLUCAP 111* 141* 145* 129* 102*    Recent Results (from the past 240 hour(s))  TECHNOLOGIST REVIEW     Status: None   Collection Time: 08/09/15 11:17 AM  Result Value Ref Range Status   Technologist Review Few Fragments, Helmets, Ovalos, and Tear Drops  Final  Blood culture (routine x 2)     Status: None (Preliminary result)   Collection Time: 08/10/15  7:55 PM  Result Value Ref Range Status   Specimen Description BLOOD RIGHT HAND  Final   Special Requests BOTTLES DRAWN AEROBIC AND ANAEROBIC 5CC  Final   Culture NO GROWTH 4 DAYS  Final   Report Status PENDING  Incomplete  Blood culture (routine x 2)     Status: None (Preliminary result)   Collection Time: 08/10/15  8:05 PM  Result Value Ref Range Status   Specimen Description BLOOD LEFT ANTECUBITAL  Final   Special Requests BOTTLES DRAWN AEROBIC AND ANAEROBIC 5CC  Final   Culture NO GROWTH 4 DAYS  Final   Report Status PENDING  Incomplete  MRSA PCR Screening     Status: None   Collection Time: 08/11/15  2:17 AM  Result Value Ref Range Status   MRSA by PCR NEGATIVE NEGATIVE Final    Comment:        The GeneXpert MRSA Assay (FDA approved for NASAL specimens only), is one component of a comprehensive MRSA colonization surveillance program. It is not intended to diagnose MRSA infection  nor to guide or monitor treatment for MRSA infections.      Scheduled Meds: . allopurinol  100 mg Oral Daily  . amiodarone  200 mg Oral Daily  . ceFEPime (MAXIPIME) IV  1 g Intravenous Q24H  . insulin aspart  0-15 Units Subcutaneous TID WC  . insulin aspart  0-5 Units Subcutaneous QHS  . levothyroxine  25 mcg Oral QAC breakfast  . metronidazole  500 mg Intravenous Q8H  . multivitamin with minerals  1 tablet Oral Daily  . pantoprazole  40 mg Oral Daily  . vancomycin  1,000 mg Intravenous Q24H   Continuous Infusions: . sodium chloride 40 mL/hr at 08/15/15 1400     LOS: 4 days    Cherene Altes, MD [facility] Office  [phone] Pager - Text Page per Shea Evans as per below:  On-Call/Text Page:      [email or web address]      password [email or web address]  If 7PM-7AM, please contact night-coverage [email or web address] Password [email or web address] 08/15/2015, 2:19 PM

## 2015-08-15 NOTE — Progress Notes (Signed)
Pt up ambulating in hallway independently at this time; steady gait noted; will cont. To monitor.  [name]

## 2015-08-15 NOTE — Progress Notes (Signed)
Pt up ambulating in hallway with daughter at this time; steady gait noted; pt denies pain; will cont. To monitor.  [name] Reason

## 2015-08-15 NOTE — Progress Notes (Signed)
MD made aware of multiple BM today; pt on multiple IV ABX, and has been since admission; new orders for Imodium to be written; will cont. To monitor.  Ruben Reason

## 2015-08-16 ENCOUNTER — Inpatient Hospital Stay (HOSPITAL_COMMUNITY): Payer: Commercial Managed Care - HMO

## 2015-08-16 DIAGNOSIS — L03116 Cellulitis of left lower limb: Secondary | ICD-10-CM

## 2015-08-16 DIAGNOSIS — N183 Chronic kidney disease, stage 3 (moderate): Secondary | ICD-10-CM

## 2015-08-16 DIAGNOSIS — I10 Essential (primary) hypertension: Secondary | ICD-10-CM

## 2015-08-16 DIAGNOSIS — I480 Paroxysmal atrial fibrillation: Secondary | ICD-10-CM

## 2015-08-16 DIAGNOSIS — D479 Neoplasm of uncertain behavior of lymphoid, hematopoietic and related tissue, unspecified: Secondary | ICD-10-CM | POA: Diagnosis present

## 2015-08-16 DIAGNOSIS — E118 Type 2 diabetes mellitus with unspecified complications: Secondary | ICD-10-CM

## 2015-08-16 LAB — BASIC METABOLIC PANEL
ANION GAP: 8 (ref 5–15)
BUN: 43 mg/dL — ABNORMAL HIGH (ref 6–20)
CHLORIDE: 110 mmol/L (ref 101–111)
CO2: 18 mmol/L — AB (ref 22–32)
CREATININE: 1.64 mg/dL — AB (ref 0.61–1.24)
Calcium: 8.8 mg/dL — ABNORMAL LOW (ref 8.9–10.3)
GFR calc non Af Amer: 35 mL/min — ABNORMAL LOW (ref >60)
GFR, EST AFRICAN AMERICAN: 41 mL/min — AB (ref >60)
Glucose, Bld: 151 mg/dL — ABNORMAL HIGH (ref 65–99)
POTASSIUM: 5.2 mmol/L — AB (ref 3.5–5.1)
SODIUM: 136 mmol/L (ref 135–145)

## 2015-08-16 LAB — GLUCOSE, CAPILLARY
GLUCOSE-CAPILLARY: 135 mg/dL — AB (ref 65–99)
GLUCOSE-CAPILLARY: 146 mg/dL — AB (ref 65–99)
Glucose-Capillary: 158 mg/dL — ABNORMAL HIGH (ref 65–99)

## 2015-08-16 LAB — MAGNESIUM
MAGNESIUM: 2.1 mg/dL (ref 1.7–2.4)
Magnesium: 1.9 mg/dL (ref 1.7–2.4)

## 2015-08-16 LAB — POTASSIUM: Potassium: 5 mmol/L (ref 3.5–5.1)

## 2015-08-16 MED ORDER — DEXTROSE 5 % IV SOLN
INTRAVENOUS | Status: DC
  Administered 2015-08-16: 12:00:00 via INTRAVENOUS

## 2015-08-16 NOTE — Progress Notes (Signed)
PROGRESS NOTE    [name]  [id number] DOB: [birth year] DOA: 08/10/2015 PCP: Binnie Rail, MD   Brief Narrative:  71 year WM PMHx Aortic Stenosis S/P TAVR [id number], Chronic Diastolic CHF,PAF not on anticoagulation CKD III, DM Type 2 , Lymphoproliferative DO with severe pancytopenia followed by Dr. Benay Spice, and  who was recently admitted for UTI/pyelonephritis requiring ICU, peripheral vasopressor, and broad spectrum antibiotics. Cultures grew E.coli and he was eventually discharged on Omnicef for 2 weeks total ABX. He later developed cellulitis of the R leg and was treated as an outpatient. 7/25 he was taken electively to OR for excision of Rt ear lesion, sutures remain in place.   7/29 he was mowing his lawn when he developed acute onset L leg pain. He thinks that maybe a branch hit it. LLE pain and edema progressed since that time, and eventually erythema developed. 8/2 he had some chills and several episodes of vomiting. He presented to PCP for these complaints. Based on his symptoms and appearance of his leg, with new onset hypotension and orthostasis, he was referred to the ED with concerns of sepsis.   In the ED LLE pain persisted and hypotension was confirmed. He remained hypotensive despite 30cc/kg IVF bolus. He was started on norepinephrine for BP support and PCCM called for admission. Pertinent labs included CO2 18, SCr 1.93 (close to baseline), AST/ALT mildly elevated, Lactic acid 1.9, glucose 196.   Subjective: 8/8 A/O x 4, patient states LLE still extremely tender but improved (patient showed a picture prior to antibiotics). Believes all started from a mosquito bite that he scratched.   Assessment & Plan:   Active Problems:   Septic shock (HCC)   Septic shock due to LLE cellulitis Shock resolved - attempted to narrow antibiotics 8/4 but leg worsened - broadened antibiotics on 8/5 with significant improvement on exam -  MRI of leg revealed no abscess or myositis - given  waxing and waning, will change to oral abx today but will need to watch another 48hrs to assure his leg does not worsen again.   -Bilat LE Doppler; negative -Not fully commenced just cellulitis obtain vasculitis panel: ANA, anti-histone, RNP pending, anti-centromere, anti-SCL 70  Mild transaminitis  Likely shock liver - resolved in f/u   Parox AFib(CHADS2-Vasc at least 4)  - not on anticoag because of pancytopenia, and previous bleed when he cut his arm. - HR controlled   QTc prolongation - chronic  -Chronic and stable - avoiding QT prolonging medications as able   Chronic diastolic CHF Baseline wgt appears to be ~82kg - no evidence of volume overload at this time - gently diurese today and stop IVF   HTN BP presently well controlled    AoS s/p TAVR 2016 No clinical evidence to suggest valve dysfxn  CKD stage 3 Baseline crt 1.9-2.2 - stable at present in lower range than reported baseline   Lymphoproliferative disorder - chronic pancytopenia  plt count holding steady around 20 - I suspect this is due to consumption, w/ signif vasculitis type change to L LE - must continue to follow trend - no evidence of acute blood loss at this time   DM type II controlled with complication -5/1 hemoglobin A1c = 5.7    Hypothyroid cont home synthroid    Hyperkalemia -D5W 59ml/hr: Repeat labs show normal potassium will DC IV fluids    DVT prophylaxis: SCD right lower extremity Code Status: DO NOT RESUSCITATE Family Communication: Wife present Disposition Plan: Discharge next 24-48  hours   Consultants:  None  Procedures/Significant Events:  8/8 bilateral lower extremity Dopplers:Left lower extremity venous duplex completed. Left lower extremity is negative for deep vein thrombosis. There is no evidence of left Baker's cyst.    Cultures 8/2 blood right hand/left AC negative 8/3 MRSA by PCR negative    Antimicrobials: Cephalexin 8/7>> Clinda 8/2 Ceftriaxone 8/2 >  8/4 Vanco 8/2 > 8/3 + 8/5 > 8/7 Cefepime 8/5 > 8/7 Flagyl 8/5 > 8/7   Devices NA   LINES / TUBES:      Continuous Infusions:    Objective: Vitals:   08/15/15 1411 08/15/15 2145 08/16/15 0507 08/16/15 0812  BP: 125/77 115/69 123/81 117/64  Pulse: 91 94 98 81  Resp: 16 17 18    Temp: 98.6 F (37 C) 98.4 F (36.9 C) 97.9 F (36.6 C)   TempSrc: Oral Oral Oral   SpO2: 100% 99% 100%   Weight:   82.3 kg (181 lb 8 oz)   Height:        Intake/Output Summary (Last 24 hours) at 08/16/15 0935 Last data filed at 08/15/15 1900  Gross per 24 hour  Intake              800 ml  Output                0 ml  Net              800 ml   Filed Weights   08/14/15 0322 08/15/15 0500 08/16/15 0507  Weight: 82.1 kg (181 lb) 87.3 kg (192 lb 6.4 oz) 82.3 kg (181 lb 8 oz)    Examination:  General: A/O 4, NAD, No acute respiratory distress Eyes: negative scleral hemorrhage, negative anisocoria, negative icterus ENT: Negative Runny nose, negative gingival bleeding, Neck:  Negative scars, masses, torticollis, lymphadenopathy, JVD Lungs: Clear to auscultation bilaterally without wheezes or crackles Cardiovascular: Irregular irregular rhythm and rate, without murmur gallop or rub normal S1 and S2 Abdomen: negative abdominal pain, nondistended, positive soft, bowel sounds, no rebound, no ascites, no appreciable mass Extremities: positive bilateral lower extremity cyanosis/swelling/erythema/pain palpation Lt>>Rt Skin: Positive left lower extremity scleroderma type rash?  Psychiatric:  Negative depression, negative anxiety, negative fatigue, negative mania  Central nervous system:  Cranial nerves II through XII intact, tongue/uvula midline, all extremities muscle strength 5/5, sensation intact throughout,  negative dysarthria, negative expressive aphasia, negative receptive aphasia.  .     Data Reviewed: Care during the described time interval was provided by me .  I have reviewed this  patient's available data, including medical history, events of note, physical examination, and all test results as part of my evaluation. I have personally reviewed and interpreted all radiology studies.  CBC:  Recent Labs Lab 08/09/15 1117 08/10/15 1955  08/12/15 0427 08/12/15 2051 08/13/15 0536 08/14/15 0325 08/15/15 0333  WBC 3.6* 7.2  < > 2.1* 1.9* 1.7* 1.6* 1.7*  NEUTROABS 2.9 6.4  --   --   --   --   --   --   HGB 10.8* 10.5*  < > 8.8* 8.7* 8.9* 8.2* 8.6*  HCT 32.2* 31.8*  < > 25.5* 27.4* 27.8* 25.4* 26.8*  MCV 86.3 86.9  < > 85.9 89.0 89.1 88.8 89.0  PLT 29* 22*  < > 19* 27* 20* 20* 23*  < > = values in this interval not displayed. Basic Metabolic Panel:  Recent Labs Lab 08/11/15 0218 08/12/15 [id number] 08/12/15 2051 08/13/15 [id number] 08/14/15 0325 08/15/15 [id number] 08/16/15 [id number]  NA 136 138 139 140 138 137 136  K 3.9 6.0* 4.8 4.5 4.9 5.3* 5.2*  CL 113* 111 111 111 111 113* 110  CO2 18* 18* 21* 19* 18* 17* 18*  GLUCOSE 184* 152* 202* 149* 156* 144* 151*  BUN 70* 62* 57* 53* 50* 41* 43*  CREATININE 1.79* 1.89* 1.80* 1.59* 1.57* 1.58* 1.64*  CALCIUM 8.1* 8.4* 8.6* 8.8* 8.5* 8.5* 8.8*  MG 1.9 2.7*  --   --   --   --   --   PHOS 3.8 3.4  --   --   --   --   --    GFR: Estimated Creatinine Clearance: 30.3 mL/min (by C-G formula based on SCr of 1.64 mg/dL). Liver Function Tests:  Recent Labs Lab 08/10/15 1955 08/12/15 2051 08/13/15 0536 08/14/15 0325  AST 88* 21 19 22   ALT 80* 42 39 34  ALKPHOS 108 83 76 76  BILITOT 1.3* 0.7 0.9 0.8  PROT 5.7* 5.3* 5.3* 5.1*  ALBUMIN 3.5 2.9* 3.0* 2.9*   No results for input(s): LIPASE, AMYLASE in the last 168 hours. No results for input(s): AMMONIA in the last 168 hours. Coagulation Profile: No results for input(s): INR, PROTIME in the last 168 hours. Cardiac Enzymes: No results for input(s): CKTOTAL, CKMB, CKMBINDEX, TROPONINI in the last 168 hours. BNP (last 3 results)  Recent Labs  05/09/15 1157  PROBNP 410.0*    HbA1C: No results for input(s): HGBA1C in the last 72 hours. CBG:  Recent Labs Lab 08/15/15 0754 08/15/15 1302 08/15/15 1731 08/15/15 2147 08/16/15 0758  GLUCAP 129* 102* 125* 180* 135*   Lipid Profile: No results for input(s): CHOL, HDL, LDLCALC, TRIG, CHOLHDL, LDLDIRECT in the last 72 hours. Thyroid Function Tests: No results for input(s): TSH, T4TOTAL, FREET4, T3FREE, THYROIDAB in the last 72 hours. Anemia Panel: No results for input(s): VITAMINB12, FOLATE, FERRITIN, TIBC, IRON, RETICCTPCT in the last 72 hours. Urine analysis:    Component Value Date/Time   COLORURINE YELLOW 08/10/2015 2036   APPEARANCEUR CLEAR 08/10/2015 2036   LABSPEC 1.018 08/10/2015 2036   PHURINE 5.0 08/10/2015 2036   GLUCOSEU NEGATIVE 08/10/2015 2036   HGBUR NEGATIVE 08/10/2015 2036   BILIRUBINUR NEGATIVE 08/10/2015 2036   KETONESUR NEGATIVE 08/10/2015 2036   PROTEINUR NEGATIVE 08/10/2015 2036   UROBILINOGEN 0.2 05/13/2014 2314   NITRITE NEGATIVE 08/10/2015 2036   LEUKOCYTESUR NEGATIVE 08/10/2015 2036   Sepsis Labs: @LABRCNTIP (procalcitonin:4,lacticidven:4)  ) Recent Results (from the past 240 hour(s))  TECHNOLOGIST REVIEW     Status: None   Collection Time: 08/09/15 11:17 AM  Result Value Ref Range Status   Technologist Review Few Fragments, Helmets, Ovalos, and Tear Drops  Final  Blood culture (routine x 2)     Status: None   Collection Time: 08/10/15  7:55 PM  Result Value Ref Range Status   Specimen Description BLOOD RIGHT HAND  Final   Special Requests BOTTLES DRAWN AEROBIC AND ANAEROBIC 5CC  Final   Culture NO GROWTH 5 DAYS  Final   Report Status 08/15/2015 FINAL  Final  Blood culture (routine x 2)     Status: None   Collection Time: 08/10/15  8:05 PM  Result Value Ref Range Status   Specimen Description BLOOD LEFT ANTECUBITAL  Final   Special Requests BOTTLES DRAWN AEROBIC AND ANAEROBIC 5CC  Final   Culture NO GROWTH 5 DAYS  Final   Report Status 08/15/2015 FINAL  Final   MRSA PCR Screening     Status: None  Collection Time: 08/11/15  2:17 AM  Result Value Ref Range Status   MRSA by PCR NEGATIVE NEGATIVE Final    Comment:        The GeneXpert MRSA Assay (FDA approved for NASAL specimens only), is one component of a comprehensive MRSA colonization surveillance program. It is not intended to diagnose MRSA infection nor to guide or monitor treatment for MRSA infections.          Radiology Studies: No results found.      Scheduled Meds: . allopurinol  100 mg Oral Daily  . amiodarone  200 mg Oral Daily  . cephALEXin  500 mg Oral Q12H  . furosemide  80 mg Oral BID  . insulin aspart  0-15 Units Subcutaneous TID WC  . insulin aspart  0-5 Units Subcutaneous QHS  . levothyroxine  25 mcg Oral QAC breakfast  . multivitamin with minerals  1 tablet Oral Daily  . pantoprazole  40 mg Oral Daily   Continuous Infusions:    LOS: 5 days    Time spent: 40 minutes    Armandina Iman, Geraldo Docker, MD [facility] Pager [phone]   If 7PM-7AM, please contact night-coverage [email or web address] Password [email or web address] 08/16/2015, 9:35 AM

## 2015-08-16 NOTE — Progress Notes (Signed)
*  Preliminary Results* Left lower extremity venous duplex completed. Left lower extremity is negative for deep vein thrombosis. There is no evidence of left Baker's cyst.  08/16/2015 11:11 AM  Maudry Mayhew, B.S., RVT, RDCS, RDMS

## 2015-08-17 ENCOUNTER — Telehealth: Payer: Self-pay | Admitting: *Deleted

## 2015-08-17 LAB — GLUCOSE, CAPILLARY
GLUCOSE-CAPILLARY: 106 mg/dL — AB (ref 65–99)
Glucose-Capillary: 163 mg/dL — ABNORMAL HIGH (ref 65–99)
Glucose-Capillary: 136 mg/dL — ABNORMAL HIGH (ref 65–99)

## 2015-08-17 MED ORDER — CEPHALEXIN 500 MG PO CAPS: 500.0000 mg | ORAL_CAPSULE | Freq: Two times a day (BID) | ORAL | 0 refills | Status: DC

## 2015-08-17 MED ORDER — PROPRANOLOL HCL 10 MG PO TABS: 10.0000 mg | ORAL_TABLET | Freq: Two times a day (BID) | ORAL | 3 refills | Status: AC

## 2015-08-17 NOTE — Discharge Instructions (Signed)

## 2015-08-17 NOTE — Progress Notes (Signed)
Pt ready for discharge to home. Pt. Is alert and oriented. Pt is hemodynamically stable. IV removed. AVS reviewed with pt. Capable of re verbalizing medication regimen. Discharge plan appropriate and in place.

## 2015-08-17 NOTE — Telephone Encounter (Signed)
Transition Care Management Follow-up Telephone Call   Date discharged? 08/17/15   How have you been since you were released from the hospital? Pt states he is doing fairly well    Do you understand why you were in the hospital? YES   Do you understand the discharge instructions? YES   Where were you discharged to? Home   Items Reviewed:  Medications reviewed: YES  Allergies reviewed: YES  Dietary changes reviewed: YES  Referrals reviewed: NO referral needed   Functional Questionnaire:   Activities of Daily Living (ADLs):   He states he are independent in the following: ambulation, bathing and hygiene, feeding, continence, grooming, toileting and dressing States he require assistance with the following: ambulation   Any transportation issues/concerns?: NO   Any patient concerns? NO   Confirmed importance and date/time of follow-up visits scheduled YES, he stated they had called earlier to make appt schedule for 08/30/15  Provider Appointment booked with Dr. Quay Burow  Confirmed with patient if condition begins to worsen call PCP or go to the ER.  Patient was given the office number and encouraged to call back with question or concerns.  : YES

## 2015-08-17 NOTE — Care Management Important Message (Signed)
Important Message  Patient Details  Name: [name] MRN: [id number] Date of Birth: [birth year]   Medicare Important Message Given:  Yes    Jehad Bisono, Leroy Sea 08/17/2015, 3:14 PM

## 2015-08-17 NOTE — Discharge Summary (Signed)
DISCHARGE SUMMARY  LUIZ ANGELOS  MR#: [id number]  DOB:05/29/1922  Date of Admission: 08/10/2015 Date of Discharge: 08/17/2015  Attending Physician:MCCLUNG,JEFFREY T  Patient's IU:[id number] Lorretta Harp, MD  Consults:  none  Disposition: D/C home   Follow-up Appts: Follow-up Information    Binnie Rail, MD Follow up in 1 week(s).   Specialty:  Internal Medicine Contact information: [place] [address] [phone]           Tests Needing Follow-up: -recheck of L LE by exam -recheck of CBC to follow WBC and PLT counts    Discharge Diagnoses: Septic shock due to LLE cellulitis Mild transaminitis  Parox AFib QTc prolongation - chronic  Chronic diastolic CHF Hx of HTN AoS s/p TAVR 2016 CKD stage 3 Lymphoproliferative disorder - chronic pancytopenia  DM Hypothyroid  Initial presentation: 82 year old male with Hx of AS s/p TAVR 2016, dCHF, CKD III, DM, lymphoproliferative disorder with severe pancytopenia followed by Dr. Benay Spice, and PAF not on anticoagulation who was recently admitted for UTI/pyelonephritis requiring ICU, peripheral vasopressor, and broad spectrum antibiotics. Cultures grew E.coli and he was eventually discharged on Omnicef for 2 weeks total ABX. He later developed cellulitis of the R leg and was treated as an outpatient. 7/25 he was taken electively to OR for excision of R ear lesion, sutures remain in place.   7/29 he was mowing his lawn when he developed acute onset L leg pain. He thinks that maybe a branch hit it. LLE pain and edema progressed since that time, and eventually erythema developed. 8/2 he had some chills and several episodes of vomiting. He presented to PCP for these complaints. Based on his symptoms and appearance of his leg, with new onset hypotension and orthostasis, he was referred to the ED with concerns of sepsis.   In the ED LLE pain persisted and hypotension was confirmed. He remained hypotensive despite 30cc/kg IVF  bolus. He was started on norepinephrine for BP support and PCCM called for admission. Pertinent labs included CO2 18, SCr 1.93 (close to baseline), AST/ALT mildly elevated, Lactic acid 1.9, glucose 196.  Hospital Course:  Septic shock due to LLE cellulitis Shock resolved - attempted to narrow antibiotics 8/4 but leg worsened - broadened antibiotics on 8/5 with significant improvement on exam -  MRI of leg revealed no abscess or myositis - doppler of L LE ruled out DVT - change to oral abx and observed ~72hrs prior to d/c - L LE is undeniably signif improved on his curent abx regimen - d/c home to complete 7 full days of oral tx   Mild transaminitis  Likely shock liver - resolved in f/u   Parox AFib CHADS2-Vasc at least 4 - not on anticoag because of pancytopenia - HR controlled   QTc prolongation - chronic  Chronic and stable - avoided QT prolonging medications as able   Chronic diastolic CHF Baseline wgt appears to be ~82kg - no evidence of volume overload at time of d/c       Filed Weights   08/13/15 0411 08/14/15 0322 08/15/15 0500  Weight: 81.3 kg (179 lb 3.7 oz) 82.1 kg (181 lb) 87.3 kg (192 lb 6.4 oz)    Hx of HTN BP well controlled at time of d/c   AoS s/p TAVR 2016 No clinical evidence to suggest valve dysfxn  CKD stage 3 Baseline crt 1.9-2.2 - stable in lower range than reported baseline   Lymphoproliferative disorder - chronic pancytopenia  plt count holding  steady around 20 - I suspect this is due to consumption, w/ signif vasculitis type change to L LE - no evidence of acute blood loss during this admit   DM CBG controlled   Hypothyroid cont home synthroid      Medication List    TAKE these medications   allopurinol 100 MG tablet Commonly known as:  ZYLOPRIM Take 100 mg by mouth daily.   amiodarone 200 MG tablet Commonly known as:  PACERONE Take 1 tablet by mouth daily   cephALEXin 500 MG capsule Commonly known as:  KEFLEX Take 1  capsule (500 mg total) by mouth every 12 (twelve) hours.   clonazePAM 0.5 MG tablet Commonly known as:  KLONOPIN Take 0.25 mg by mouth daily as needed (Take 1/2 tablet once daily as needed if you wake up early).   colchicine 0.6 MG tablet Take 0.6 mg by mouth daily as needed (gout flares).   ferrous sulfate 325 (65 FE) MG tablet Take 325 mg by mouth 2 (two) times daily with a meal.   fish oil-omega-3 fatty acids 1000 MG capsule Take 1 g by mouth daily.   Flax Seed Oil 1000 MG Caps Take 1 capsule by mouth daily.   furosemide 80 MG tablet Commonly known as:  LASIX Take 80mg  in the morning and 40mg  in the afternoon   levothyroxine 25 MCG tablet Commonly known as:  SYNTHROID, LEVOTHROID Take 25 mcg by mouth daily before breakfast.   metolazone 2.5 MG tablet Commonly known as:  ZAROXOLYN Take 1 tablet (2.5 mg total) by mouth daily as needed (edema).   multivitamin tablet Take 1 tablet by mouth daily.   omeprazole 20 MG capsule Commonly known as:  PRILOSEC TAKE 1 CAPSULE EVERY DAY  (REPLACES  PANTOPRAZOLE)   potassium chloride 10 MEQ tablet Commonly known as:  K-DUR Take 1 tablet (10 mEq total) by mouth daily. What changed:  Another medication with the same name was removed. Continue taking this medication, and follow the directions you see here.   propranolol 10 MG tablet Commonly known as:  INDERAL Take 1 tablet (10 mg total) by mouth 2 (two) times daily. Start taking on:  08/19/2015   sertraline 50 MG tablet Commonly known as:  ZOLOFT TAKE 1 TABLET (50 MG TOTAL) BY MOUTH AT BEDTIME.   traMADol 50 MG tablet Commonly known as:  ULTRAM Take 50 mg by mouth every 8 (eight) hours as needed (pain). Reported on 06/28/2015   vitamin C 500 MG tablet Commonly known as:  ASCORBIC ACID Take 1 tablet (500 mg total) by mouth daily.   Vitamin D 1000 units capsule Take 1 capsule (1,000 Units total) by mouth daily.       Day of Discharge BP 121/85 (BP Location: Left Arm)    Pulse 97   Temp 98.6 F (37 C) (Oral)   Resp 18   Ht 5\' 10"  (1.778 m)   Wt 80.6 kg (177 lb 11.2 oz)   SpO2 100%   BMI 25.50 kg/m   Physical Exam: General: No acute respiratory distress Lungs: Clear to auscultation bilaterally without wheezes or crackles Cardiovascular: Regular rate and rhythm without murmur gallop or rub normal S1 and S2 Abdomen: Nontender, nondistended, soft, bowel sounds positive, no rebound, no ascites, no appreciable mass Extremities: L LE dramatically improved over last 48hrs w/ no calor and almost complete resolution of erythema w/ brownish discoloration of skin c/w hemosiderin transformation of prior leukocytoclastic appearing rash   Basic Metabolic Panel:  Recent Labs Lab  08/11/15 [id number] 08/12/15 0427 08/12/15 2051 08/13/15 0536 08/14/15 0325 08/15/15 0333 08/16/15 0539 08/16/15 1733  NA 136 138 139 140 138 137 136  --   K 3.9 6.0* 4.8 4.5 4.9 5.3* 5.2* 5.0  CL 113* 111 111 111 111 113* 110  --   CO2 18* 18* 21* 19* 18* 17* 18*  --   GLUCOSE 184* 152* 202* 149* 156* 144* 151*  --   BUN 70* 62* 57* 53* 50* 41* 43*  --   CREATININE 1.79* 1.89* 1.80* 1.59* 1.57* 1.58* 1.64*  --   CALCIUM 8.1* 8.4* 8.6* 8.8* 8.5* 8.5* 8.8*  --   MG 1.9 2.7*  --   --   --   --  2.1 1.9  PHOS 3.8 3.4  --   --   --   --   --   --     Liver Function Tests:  Recent Labs Lab 08/10/15 1955 08/12/15 2051 08/13/15 0536 08/14/15 0325  AST 88* 21 19 22   ALT 80* 42 39 34  ALKPHOS 108 83 76 76  BILITOT 1.3* 0.7 0.9 0.8  PROT 5.7* 5.3* 5.3* 5.1*  ALBUMIN 3.5 2.9* 3.0* 2.9*    CBC:  Recent Labs Lab 08/10/15 1955  08/12/15 0427 08/12/15 2051 08/13/15 0536 08/14/15 0325 08/15/15 0333  WBC 7.2  < > 2.1* 1.9* 1.7* 1.6* 1.7*  NEUTROABS 6.4  --   --   --   --   --   --   HGB 10.5*  < > 8.8* 8.7* 8.9* 8.2* 8.6*  HCT 31.8*  < > 25.5* 27.4* 27.8* 25.4* 26.8*  MCV 86.9  < > 85.9 89.0 89.1 88.8 89.0  PLT 22*  < > 19* 27* 20* 20* 23*  < > = values in this interval not  displayed.  CBG:  Recent Labs Lab 08/16/15 1136 08/16/15 1703 08/16/15 2151 08/17/15 0747 08/17/15 1154  GLUCAP 146* 158* 163* 136* 106*    Recent Results (from the past 240 hour(s))  TECHNOLOGIST REVIEW     Status: None   Collection Time: 08/09/15 11:17 AM  Result Value Ref Range Status   Technologist Review Few Fragments, Helmets, Ovalos, and Tear Drops  Final  Blood culture (routine x 2)     Status: None   Collection Time: 08/10/15  7:55 PM  Result Value Ref Range Status   Specimen Description BLOOD RIGHT HAND  Final   Special Requests BOTTLES DRAWN AEROBIC AND ANAEROBIC 5CC  Final   Culture NO GROWTH 5 DAYS  Final   Report Status 08/15/2015 FINAL  Final  Blood culture (routine x 2)     Status: None   Collection Time: 08/10/15  8:05 PM  Result Value Ref Range Status   Specimen Description BLOOD LEFT ANTECUBITAL  Final   Special Requests BOTTLES DRAWN AEROBIC AND ANAEROBIC 5CC  Final   Culture NO GROWTH 5 DAYS  Final   Report Status 08/15/2015 FINAL  Final  MRSA PCR Screening     Status: None   Collection Time: 08/11/15  2:17 AM  Result Value Ref Range Status   MRSA by PCR NEGATIVE NEGATIVE Final    Comment:        The GeneXpert MRSA Assay (FDA approved for NASAL specimens only), is one component of a comprehensive MRSA colonization surveillance program. It is not intended to diagnose MRSA infection nor to guide or monitor treatment for MRSA infections.      Time spent in discharge (includes decision making &  examination of pt): >30 minutes  08/17/2015, 12:15 PM   Cherene Altes, MD [facility] Office  [phone] Pager [phone]  On-Call/Text Page:      [email or web address]      password [facility]

## 2015-08-17 NOTE — Progress Notes (Signed)
Kim from lab called RN to clarify an order that was placed for this patient. RN has paged MD to calrify.

## 2015-08-18 LAB — HISTONE ANTIBODIES, IGG, BLOOD: DNA-HISTONE: 0.6 U (ref 0.0–0.9)

## 2015-08-24 ENCOUNTER — Telehealth: Payer: Self-pay | Admitting: *Deleted
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
## 2015-08-24 NOTE — Telephone Encounter (Signed)
No we do not want to give antibiotics if they are not needed, especially since he has been on so many antibiotics in the recent past.  He should monitor closely and at the very first sight of a possible infection he should call

## 2015-08-25 ENCOUNTER — Encounter: Payer: Self-pay | Admitting: Cardiovascular Disease

## 2015-08-27 ENCOUNTER — Encounter: Payer: Self-pay | Admitting: Family Medicine

## 2015-08-27 ENCOUNTER — Ambulatory Visit (INDEPENDENT_AMBULATORY_CARE_PROVIDER_SITE_OTHER): Payer: Commercial Managed Care - HMO | Admitting: Family Medicine

## 2015-08-27 VITALS — BP 120/64 | HR 85 | Temp 98.4°F | Resp 20 | Wt 178.0 lb

## 2015-08-27 DIAGNOSIS — S81809A Unspecified open wound, unspecified lower leg, initial encounter: Secondary | ICD-10-CM | POA: Insufficient documentation

## 2015-08-27 DIAGNOSIS — S81802A Unspecified open wound, left lower leg, initial encounter: Secondary | ICD-10-CM

## 2015-08-27 DIAGNOSIS — L03116 Cellulitis of left lower limb: Secondary | ICD-10-CM | POA: Diagnosis not present

## 2015-08-27 MED ORDER — CEPHALEXIN 500 MG PO CAPS: 500.0000 mg | ORAL_CAPSULE | Freq: Four times a day (QID) | ORAL | 0 refills | Status: DC

## 2015-08-27 NOTE — Patient Instructions (Signed)

## 2015-08-27 NOTE — Progress Notes (Signed)
Pre visit review using our clinic review tool, if applicable. No additional management support is needed unless otherwise documented below in the visit note. 

## 2015-08-27 NOTE — Assessment & Plan Note (Signed)
abx ointment and bandage in place F/u pcp Monday

## 2015-08-27 NOTE — Progress Notes (Signed)
Patient ID: [name], male    DOB: 04-Nov-1922  Age: 82 y.o. MRN: [id number]    Subjective:  Subjective  HPI [name] presents for wound/ cellulitis L leg.  He was recently in the hospital for cellulitis / sepsis and finished his abx.  Pt noticed redness coming back over last few days and was concerned it would get bad fast as it did the last time so he came in.  No fever, no n/v.  No calf pain.   Pt cut his leg with his fishing rod and that is when redness started.    Review of Systems  Constitutional: Negative for appetite change, diaphoresis, fatigue and unexpected weight change.  Eyes: Negative for pain, redness and visual disturbance.  Respiratory: Negative for cough, chest tightness, shortness of breath and wheezing.   Cardiovascular: Negative for chest pain, palpitations and leg swelling.  Endocrine: Negative for cold intolerance, heat intolerance, polydipsia, polyphagia and polyuria.  Genitourinary: Negative for difficulty urinating, dysuria and frequency.  Skin: Positive for color change and wound.  Neurological: Negative for dizziness, light-headedness, numbness and headaches.    History Past Medical History:  Diagnosis Date  . Action tremor   . Anxiety   . Aortic stenosis    a. Previously severe -> s/p minimally invasive tissue AVR with Dr. Evelina Walton at [facility] 01/2011 (pre-AVR cath with no obs CAD);  b. 05/2014 s/p TAVR (23 mm Edwards Sapien 3).  . Bacteremia    a. 12/2012 - S bovis;  b. 12/2012 TEE w/o veg;  c. Seeing ID->Rocephin therapy extended to 01/25/2013 via PICC for possible endocarditis (No veg on TEE).  . Bradycardia   . Cellulitis of left leg 10/11-16/2012  . Chronic diastolic CHF (congestive heart failure) ([place])    a. 12/15/2012 TEE: EF 60-65%, no veg.  . CKD (chronic kidney disease), stage III   . Colon polyp   . Degenerative disc disease   . Diabetes mellitus    "borderline" (01/05/2013)  . Diverticulosis   . GERD (gastroesophageal reflux disease)   .  Gout   . H/O hiatal hernia   . High cholesterol   . History of blood transfusion 01/2011; 11/2012  . Hypertension   . Hypotension   . Hypothyroidism   . Joint effusion, knee    left knee  . Leukopenia    Chronic pancytopenia  . PAF (paroxysmal atrial fibrillation) (HCC)    a. amiodarone therapy; not felt to be a candidate for anticoagulation with pancytopenia. b. Back in AF 09/2014 - decision was made to pursue rate control only.  . Pancytopenia ([place])    a. possible chronic lymphoproliferative disorder or splenic lymphoma.  . QT prolongation   . S/P TAVR (transcatheter aortic valve replacement)    a. 05/14/2014 TAVR: 23 mm Edwards Sapien 3 transcatheter heart valve placed valve-in-valve for prosthetic valve dysfunction via open right transfemoral approach  . Splenomegaly   . Stroke ([place])   . Synovial cyst of popliteal space   . Throat cancer ([place])    s/p lasered  . Thrombocytopenia ([place])    Dr. Benay Spice    He has a past surgical history that includes Cholecystectomy; joint effusion; Aortic valve replacement (01/23/2011); TEE without cardioversion (N/A, 12/15/2012); Cardiac valve replacement; Inguinal hernia repair (Right); Excisional hemorrhoidectomy; Cardiac catheterization; Surgery scrotal / testicular; Microlaryngoscopy with co2 laser and excision of vocal cord lesion (1980's); TEE without cardioversion (N/A, 03/26/2014); Transcatheter aortic valve replacement, transfemoral (Right, 05/14/2014); TEE without cardioversion (N/A, 05/14/2014); and Lesion removal (  Right, 08/02/2015).   His family history includes Cancer in his brother; Colon cancer in his other; Heart disease in his father; Lung cancer in his mother; Stroke in his other.He reports that he quit smoking about 53 years ago. His smoking use included Cigarettes. He has a 18.00 pack-year smoking history. He has never used smokeless tobacco. He reports that he does not drink alcohol or use drugs.  Current Outpatient Prescriptions on File  Prior to Visit  Medication Sig Dispense Refill  . allopurinol (ZYLOPRIM) 100 MG tablet Take 100 mg by mouth daily.    [name] Kitchen amiodarone (PACERONE) 200 MG tablet Take 1 tablet by mouth daily 90 tablet 0  . cephALEXin (KEFLEX) 500 MG capsule Take 1 capsule (500 mg total) by mouth every 12 (twelve) hours. 10 capsule 0  . Cholecalciferol (VITAMIN D) 1000 UNITS capsule Take 1 capsule (1,000 Units total) by mouth daily. 90 capsule 3  . clonazePAM (KLONOPIN) 0.5 MG tablet Take 0.25 mg by mouth daily as needed (Take 1/2 tablet once daily as needed if you wake up early).    . colchicine 0.6 MG tablet Take 0.6 mg by mouth daily as needed (gout flares).     . ferrous sulfate 325 (65 FE) MG tablet Take 325 mg by mouth 2 (two) times daily with a meal.    . fish oil-omega-3 fatty acids 1000 MG capsule Take 1 g by mouth daily.      . Flaxseed, Linseed, (FLAX SEED OIL) 1000 MG CAPS Take 1 capsule by mouth daily.     . furosemide (LASIX) 80 MG tablet Take 80mg  in the morning and 40mg  in the afternoon 60 tablet 6  . levothyroxine (SYNTHROID, LEVOTHROID) 25 MCG tablet Take 25 mcg by mouth daily before breakfast.    . metolazone (ZAROXOLYN) 2.5 MG tablet Take 1 tablet (2.5 mg total) by mouth daily as needed (edema). 30 tablet 3  . Multiple Vitamin (MULTIVITAMIN) tablet Take 1 tablet by mouth daily.      [name] Kitchen omeprazole (PRILOSEC) 20 MG capsule TAKE 1 CAPSULE EVERY DAY  (REPLACES  PANTOPRAZOLE) 90 capsule 3  . potassium chloride (K-DUR) 10 MEQ tablet Take 1 tablet (10 mEq total) by mouth daily. 90 tablet 1  . propranolol (INDERAL) 10 MG tablet Take 1 tablet (10 mg total) by mouth 2 (two) times daily. 180 tablet 3  . sertraline (ZOLOFT) 50 MG tablet TAKE 1 TABLET (50 MG TOTAL) BY MOUTH AT BEDTIME. 90 tablet 0  . traMADol (ULTRAM) 50 MG tablet Take 50 mg by mouth every 8 (eight) hours as needed (pain). Reported on 06/28/2015    . vitamin C (ASCORBIC ACID) 500 MG tablet Take 1 tablet (500 mg total) by mouth daily. 90 tablet 3    No current facility-administered medications on file prior to visit.      Objective:  Objective  Physical Exam  Constitutional: He is oriented to person, place, and time. Vital signs are normal. He appears well-developed and well-nourished. He is sleeping.  HENT:  Head: Normocephalic and atraumatic.  Mouth/Throat: Oropharynx is clear and moist.  Eyes: EOM are normal. Pupils are equal, round, and reactive to light.  Neck: Normal range of motion. Neck supple. No thyromegaly present.  Cardiovascular: Normal rate and regular rhythm.   No murmur heard. Pulmonary/Chest: Effort normal and breath sounds normal. No respiratory distress. He has no wheezes. He has no rales. He exhibits no tenderness.  Musculoskeletal: He exhibits edema and tenderness.  Neurological: He is alert and oriented to  person, place, and time.  Skin: Skin is warm and dry. There is erythema.     Psychiatric: He has a normal mood and affect. His behavior is normal. Judgment and thought content normal.  Nursing note and vitals reviewed.  BP 120/64   Pulse 85   Temp 98.4 F (36.9 C) (Oral)   Resp 20   Wt 178 lb (80.7 kg)   SpO2 98%   BMI 25.54 kg/m  Wt Readings from Last 3 Encounters:  08/27/15 178 lb (80.7 kg)  08/17/15 177 lb 11.2 oz (80.6 kg)  08/10/15 176 lb (79.8 kg)     Lab Results  Component Value Date   WBC 1.7 (L) 08/15/2015   HGB 8.6 (L) 08/15/2015   HCT 26.8 (L) 08/15/2015   PLT 23 (LL) 08/15/2015   GLUCOSE 151 (H) 08/16/2015   CHOL 84 09/21/2013   TRIG 94.0 09/21/2013   HDL 25.60 (L) 09/21/2013   LDLCALC 40 09/21/2013   ALT 34 08/14/2015   AST 22 08/14/2015   NA 136 08/16/2015   K 5.0 08/16/2015   CL 110 08/16/2015   CREATININE 1.64 (H) 08/16/2015   BUN 43 (H) 08/16/2015   CO2 18 (L) 08/16/2015   TSH 2.664 05/18/2015   INR 1.32 10/04/2014   HGBA1C 5.7 05/09/2015    Dg Chest Port 1 View  Result Date: 08/10/2015 CLINICAL DATA:  Possible infection. EXAM: PORTABLE CHEST 1 VIEW  COMPARISON:  Radiographs of May 17, 2015. FINDINGS: Mild cardiomegaly is noted. No pneumothorax or pleural effusion is noted. Both lungs are clear. The visualized skeletal structures are unremarkable. IMPRESSION: No acute cardiopulmonary abnormality seen. Electronically Signed   By: Marijo Conception, M.D.   On: 08/10/2015 20:24     Assessment & Plan:  Plan  I am having [name] start on cephALEXin. I am also having him maintain his fish oil-omega-3 fatty acids, Flax Seed Oil, multivitamin, vitamin C, Vitamin D, colchicine, potassium chloride, ferrous sulfate, levothyroxine, traMADol, metolazone, amiodarone, sertraline, allopurinol, clonazePAM, furosemide, omeprazole, cephALEXin, and propranolol.  Meds ordered this encounter  Medications  . cephALEXin (KEFLEX) 500 MG capsule    Sig: Take 1 capsule (500 mg total) by mouth 4 (four) times daily.    Dispense:  40 capsule    Refill:  0    Problem List Items Addressed This Visit      Unprioritized   Cellulitis of leg, left - Primary    Pt just recently d/c from hospital --- had hosp f/u for Tuesday but  reddness worsened again over last 2 days No fever and n/v as before Start abx Elevated leg Dressing with abx ointment put on wound  F/u pcp Monday to check leg and make sure it is improving Pt was instructed to go to ER if redness worsens over weekend      Relevant Medications   cephALEXin (KEFLEX) 500 MG capsule   Wound, open, leg    abx ointment and bandage in place F/u pcp Monday       Other Visit Diagnoses   None.   pt refused tetanus booster today  Follow-up: Return in about 2 days (around 08/29/2015) for cellulitis.  Ann Held, DO    +

## 2015-08-27 NOTE — Assessment & Plan Note (Signed)
Pt just recently d/c from hospital --- had hosp f/u for Tuesday but  reddness worsened again over last 2 days No fever and n/v as before Start abx Elevated leg Dressing with abx ointment put on wound  F/u pcp Monday to check leg and make sure it is improving Pt was instructed to go to ER if redness worsens over weekend

## 2015-08-28 NOTE — Progress Notes (Signed)
Subjective:    Patient ID: [name], male    DOB: [birth year], 82 y.o.   MRN: [id number]  HPI The patient is here for follow up of cellulitis.  He had cellulitis in the RLE on 7/11, which we treated as an outpatient.  He was seen on 08/10/15 as an outpatient for chills, nausea/vomiting, cellulitis of the LLE and was sent to the ED for possible sepsis. He was admitted for septic shock due to LLE cellulitis.  He now has cellulitis again in the LLE.     He was seen two days ago for possible cellulitis and is here for follow up.  Over the past few days he has had redness in the left lower leg and was concerned about the cellulitis returning.  He had cut his leg with a fishing rod and that is when the redness started.  He was started on keflex two days ago and is here for follow up.    He has been been taking the antibiotic as prescribed.  He does not feel it is any better.  It is not any worse.  He denies fever and chills. He denies headaches, change in appetite, chest pain, palpitations and lightheadedness.   Medications and allergies reviewed with patient and updated if appropriate.  Patient Active Problem List   Diagnosis Date Noted  . Wound, open, leg 08/27/2015  . Lymphoproliferative disorder ([place])   . Cellulitis of left lower extremity 07/19/2015  . Ear pain 06/29/2015  . Sepsis due to Escherichia coli ([place])   . UTI (lower urinary tract infection) 05/18/2015  . Hypotension 05/18/2015  . Acute on chronic diastolic CHF (congestive heart failure) ([place]) 05/18/2015  . Sepsis ([place]) 05/09/2015  . Hypervolemia 05/09/2015  . Cellulitis of left upper extremity 03/11/2015  . QT prolongation   . Generalized weakness 10/04/2014  . Chest discomfort 10/04/2014  . GERD (gastroesophageal reflux disease) 10/04/2014  . Chronic diastolic congestive heart failure ([place]) 10/04/2014  . Impingement syndrome of right shoulder region 05/21/2014  . Severe aortic stenosis 05/14/2014  . S/P TAVR  (transcatheter aortic valve replacement) 05/14/2014  . Severe aortic insufficiency 04/05/2014  . Depression 10/02/2013  . Cellulitis of leg, left 08/01/2013  . Aortic atherosclerosis ([place]) 01/06/2013  . Paroxysmal atrial fibrillation ([place]) 01/06/2013  . Lower extremity edema 01/06/2013  . CKD (chronic kidney disease), stage III 01/06/2013  . Bacteremia 12/30/2012  . S/P AVR 12/13/2012  . Protein-calorie malnutrition, severe ([place]) 12/13/2012  . Sinus bradycardia 10/04/2011  . Hypothyroidism 02/27/2011  . Restless legs 02/27/2011  . Other pancytopenia ([place]) 12/06/2009  . HYPERURICEMIA, ASYMPTOMATIC 07/25/2009  . HYPERGLYCEMIA, FASTING 01/05/2009  . CAD, NATIVE VESSEL 12/21/2008  . Type 2 diabetes mellitus with complication ([place]) [id number]  . Aortic valve disorder 05/15/2008  . ACTION TREMOR 01/14/2007  . POPLITEAL CYST, RIGHT 11/05/2006  . Gout 05/13/2006  . Thrombocytopenia ([place]) 05/13/2006  . ANXIETY 05/13/2006  . Essential hypertension 05/13/2006  . CVA 05/13/2006  . [place] DISEASE 05/13/2006  . Splenomegaly 05/13/2006    Current Outpatient Prescriptions on File Prior to Visit  Medication Sig Dispense Refill  . allopurinol (ZYLOPRIM) 100 MG tablet Take 100 mg by mouth daily.    [name] Kitchen amiodarone (PACERONE) 200 MG tablet Take 1 tablet by mouth daily 90 tablet 0  . cephALEXin (KEFLEX) 500 MG capsule Take 1 capsule (500 mg total) by mouth 4 (four) times daily. 40 capsule 0  . Cholecalciferol (VITAMIN D) 1000 UNITS capsule Take 1 capsule (1,000  Units total) by mouth daily. 90 capsule 3  . clonazePAM (KLONOPIN) 0.5 MG tablet Take 0.25 mg by mouth daily as needed (Take 1/2 tablet once daily as needed if you wake up early).    . colchicine 0.6 MG tablet Take 0.6 mg by mouth daily as needed (gout flares).     . ferrous sulfate 325 (65 FE) MG tablet Take 325 mg by mouth 2 (two) times daily with a meal.    . fish oil-omega-3 fatty acids 1000 MG capsule Take 1 g by mouth daily.       . Flaxseed, Linseed, (FLAX SEED OIL) 1000 MG CAPS Take 1 capsule by mouth daily.     . furosemide (LASIX) 80 MG tablet Take 80mg  in the morning and 40mg  in the afternoon 60 tablet 6  . levothyroxine (SYNTHROID, LEVOTHROID) 25 MCG tablet Take 25 mcg by mouth daily before breakfast.    . metolazone (ZAROXOLYN) 2.5 MG tablet Take 1 tablet (2.5 mg total) by mouth daily as needed (edema). 30 tablet 3  . Multiple Vitamin (MULTIVITAMIN) tablet Take 1 tablet by mouth daily.      [name] Kitchen omeprazole (PRILOSEC) 20 MG capsule TAKE 1 CAPSULE EVERY DAY  (REPLACES  PANTOPRAZOLE) 90 capsule 3  . potassium chloride (K-DUR) 10 MEQ tablet Take 1 tablet (10 mEq total) by mouth daily. 90 tablet 1  . propranolol (INDERAL) 10 MG tablet Take 1 tablet (10 mg total) by mouth 2 (two) times daily. 180 tablet 3  . sertraline (ZOLOFT) 50 MG tablet TAKE 1 TABLET (50 MG TOTAL) BY MOUTH AT BEDTIME. 90 tablet 0  . traMADol (ULTRAM) 50 MG tablet Take 50 mg by mouth every 8 (eight) hours as needed (pain). Reported on 06/28/2015    . vitamin C (ASCORBIC ACID) 500 MG tablet Take 1 tablet (500 mg total) by mouth daily. 90 tablet 3   No current facility-administered medications on file prior to visit.     Past Medical History:  Diagnosis Date  . Action tremor   . Anxiety   . Aortic stenosis    a. Previously severe -> s/p minimally invasive tissue AVR with Dr. Evelina Dun at [facility] 01/2011 (pre-AVR cath with no obs CAD);  b. 05/2014 s/p TAVR (23 mm Edwards Sapien 3).  . Bacteremia    a. 12/2012 - S bovis;  b. 12/2012 TEE w/o veg;  c. Seeing ID->Rocephin therapy extended to 01/25/2013 via PICC for possible endocarditis (No veg on TEE).  . Bradycardia   . Cellulitis of left leg 10/11-16/2012  . Chronic diastolic CHF (congestive heart failure) ([place])    a. 12/15/2012 TEE: EF 60-65%, no veg.  . CKD (chronic kidney disease), stage III   . Colon polyp   . Degenerative disc disease   . Diabetes mellitus    "borderline" (01/05/2013)  .  Diverticulosis   . GERD (gastroesophageal reflux disease)   . Gout   . H/O hiatal hernia   . High cholesterol   . History of blood transfusion 01/2011; 11/2012  . Hypertension   . Hypotension   . Hypothyroidism   . Joint effusion, knee    left knee  . Leukopenia    Chronic pancytopenia  . PAF (paroxysmal atrial fibrillation) (HCC)    a. amiodarone therapy; not felt to be a candidate for anticoagulation with pancytopenia. b. Back in AF 09/2014 - decision was made to pursue rate control only.  . Pancytopenia ([place])    a. possible chronic lymphoproliferative disorder or splenic lymphoma.  [name] Kitchen  QT prolongation   . S/P TAVR (transcatheter aortic valve replacement)    a. 05/14/2014 TAVR: 23 mm Edwards Sapien 3 transcatheter heart valve placed valve-in-valve for prosthetic valve dysfunction via open right transfemoral approach  . Splenomegaly   . Stroke ([place])   . Synovial cyst of popliteal space   . Throat cancer ([employer])    s/p lasered  . Thrombocytopenia (HCC)    Dr. Benay Spice    Past Surgical History:  Procedure Laterality Date  . AORTIC VALVE REPLACEMENT  01/23/2011   via minimally invasive approach per Dr Evelina Dun, [facility]  . CARDIAC CATHETERIZATION    . CARDIAC VALVE REPLACEMENT    . CHOLECYSTECTOMY    . EXCISIONAL HEMORRHOIDECTOMY    . INGUINAL HERNIA REPAIR Right   . joint effusion     left knee  . LESION REMOVAL Right 08/02/2015   Procedure: EXCISION  RIGHT EAR SKIN CANCER;  Surgeon: Rozetta Nunnery, MD;  Location: [place];  Service: ENT;  Laterality: Right;  LOCAL  . 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 SURGERY SCROTAL / TESTICULAR     "removed one" (01/05/2013)  . TEE WITHOUT CARDIOVERSION N/A 12/15/2012   Procedure: TRANSESOPHAGEAL ECHOCARDIOGRAM (TEE);  Surgeon: [name], MD;  Location: [employer];   Service: Cardiovascular;  Laterality: N/A;  . TEE WITHOUT CARDIOVERSION N/A 03/26/2014   Procedure: TRANSESOPHAGEAL ECHOCARDIOGRAM (TEE);  Surgeon: Josue Hector, MD;  Location: [place];  Service: Cardiovascular;  Laterality: N/A;  . TEE WITHOUT CARDIOVERSION N/A 05/14/2014   Procedure: TRANSESOPHAGEAL ECHOCARDIOGRAM (TEE);  Surgeon: [name], MD;  Location: [place];  Service: Open Heart Surgery;  Laterality: N/A;  . TRANSCATHETER AORTIC VALVE REPLACEMENT, TRANSFEMORAL Right 05/14/2014   Procedure: TRANSCATHETER AORTIC VALVE REPLACEMENT, TRANSFEMORAL;  Surgeon: [name], MD;  Location: [place];  Service: Open Heart Surgery;  Laterality: Right;    Social History   Social History  . Marital status: Widowed    Spouse name: N/A  . Number of children: 3  . Years of education: N/A   Occupational History  . Retired Agricultural consultant Retired   Social History Main Topics  . Smoking status: Former Smoker    Packs/day: 0.75    Years: 24.00    Types: Cigarettes    Quit date: 01/11/1962  . Smokeless tobacco: Never Used  . Alcohol use No  . Drug use: No  . Sexual activity: No   Other Topics Concern  . Not on file   Social History Narrative   [place] farm outside of [place]   Lives with his wife   Smoked until 1964 about 7 cigarettes a day for 30 years   No alcohol history.   No drug history   Very active, no regimented exercise                Family History  Problem Relation Age of Onset  . Lung cancer Mother   . Heart disease Father   . Colon cancer Other   . Stroke Other   . Cancer Brother     Review of Systems  Constitutional: Negative for appetite change, chills and fever.  Respiratory: Negative for shortness of breath.   Cardiovascular: Positive for leg swelling. Negative for chest pain and palpitations.  Gastrointestinal: Negative for abdominal pain and nausea.  Skin: Positive for  color change and wound.  Neurological: Negative for dizziness, light-headedness and  headaches.       Objective:   Vitals:   08/29/15 1137  BP: 106/68  Pulse: 96  Resp: 16  Temp: 98 F (36.7 C)   Filed Weights   08/29/15 1137  Weight: 181 lb (82.1 kg)   Body mass index is 25.97 kg/m.   Physical Exam    Constitutional: Chronically ill appear. No distress.  Left lower extremity: 2 +pitting edema to knee, laceration posterior upper left lower leg without surrounding erythema or discharge, generalized erythema from toes to mid lower leg associated with tenderness, warm, no other skin breaks     Assessment & Plan:    See Problem List for Assessment and Plan of chronic medical problems.

## 2015-08-29 ENCOUNTER — Encounter: Payer: Self-pay | Admitting: Internal Medicine

## 2015-08-29 ENCOUNTER — Ambulatory Visit (INDEPENDENT_AMBULATORY_CARE_PROVIDER_SITE_OTHER): Payer: Commercial Managed Care - HMO | Admitting: Internal Medicine

## 2015-08-29 VITALS — BP 106/68 | HR 96 | Temp 98.0°F | Resp 16 | Wt 181.0 lb

## 2015-08-29 DIAGNOSIS — L03116 Cellulitis of left lower limb: Secondary | ICD-10-CM

## 2015-08-29 MED ORDER — DOXYCYCLINE MONOHYDRATE 100 MG PO CAPS: 100.0000 mg | ORAL_CAPSULE | Freq: Two times a day (BID) | ORAL | 0 refills | Status: DC

## 2015-08-29 NOTE — Progress Notes (Signed)
Pre visit review using our clinic review tool, if applicable. No additional management support is needed unless otherwise documented below in the visit note. 

## 2015-08-29 NOTE — Assessment & Plan Note (Signed)
Here for follow up of cellulitis No improvement with keflex alone Start doxycycline Elevate leg when sitting Monitor closely He is high risk of needing to go to the hospital - discussed that he needs to have a low threshold for going to the ED if he is not feeling well and should not wait - he should not drive himself.  Follow up in two days with me, he will call sooner if his symptoms worsen or if he has any concerns.

## 2015-08-29 NOTE — Patient Instructions (Addendum)
  Medications reviewed and updated.  Changes include starting doxycycline 100 mg twice daily for two weeks.   Your prescription(s) have been submitted to your pharmacy. Please take as directed and contact our office if you believe you are having problem(s) with the medication(s).   Please followup on wednesday

## 2015-08-30 ENCOUNTER — Inpatient Hospital Stay: Payer: Commercial Managed Care - HMO | Admitting: Internal Medicine

## 2015-08-31 ENCOUNTER — Encounter: Payer: Self-pay | Admitting: Internal Medicine

## 2015-08-31 ENCOUNTER — Ambulatory Visit (INDEPENDENT_AMBULATORY_CARE_PROVIDER_SITE_OTHER): Payer: Commercial Managed Care - HMO | Admitting: Internal Medicine

## 2015-08-31 VITALS — BP 114/58 | HR 79 | Temp 97.7°F | Resp 16 | Wt 180.0 lb

## 2015-08-31 DIAGNOSIS — L03116 Cellulitis of left lower limb: Secondary | ICD-10-CM

## 2015-08-31 DIAGNOSIS — S81809A Unspecified open wound, unspecified lower leg, initial encounter: Secondary | ICD-10-CM | POA: Diagnosis not present

## 2015-08-31 NOTE — Patient Instructions (Signed)
   Medications reviewed and updated.  No changes recommended at this time.  If your infection does not continue to improve please call immediately.

## 2015-08-31 NOTE — Assessment & Plan Note (Signed)
Cellulitis improved with addition of doxycycline He will continue to monitor closely Will discuss with cardiology additional diuresis If he does not see continued improvement or sees any worsening he will call or come in immediately-he is high risk for sepsis Continue local wound care for skin break left lower posterior leg

## 2015-08-31 NOTE — Progress Notes (Signed)
Pre visit review using our clinic review tool, if applicable. No additional management support is needed unless otherwise documented below in the visit note. 

## 2015-08-31 NOTE — Progress Notes (Signed)
Subjective:    Patient ID: [name], male    DOB: [birth year], 82 y.o.   MRN: [id number]  HPI He is here for follow-up of his left lower extremity cellulitis. He was seen last week and started on Keflex, but there was no improvement a few days later. 2 days ago we started him on doxycycline. He has seen improvement over the past 48 hours. His left leg is slightly less swollen-he lost just over a pound overnight. His left leg is less tender, warm and red. He has not had any fevers, chills, lightheadedness, headaches, nausea. He still has significant edema. He does see his cardiologist in 2 days and was advised by him not to increase his medication without calling him first. He does have Zaroxolyn at home that he can take, but will discuss this with his cardiologist in 2 days.  Today while working in the yard he did have a branch that scraped his right lower extremity near his knee. It did bleed. There is no active discharge. He did put some Neosporin on it and has a Band-Aid on it.  Medications and allergies reviewed with patient and updated if appropriate.  Patient Active Problem List   Diagnosis Date Noted  . Wound, open, leg 08/27/2015  . Lymphoproliferative disorder ([place])   . Cellulitis of left lower extremity 07/19/2015  . Ear pain 06/29/2015  . Sepsis due to Escherichia coli ([place])   . UTI (lower urinary tract infection) 05/18/2015  . Hypotension 05/18/2015  . Acute on chronic diastolic CHF (congestive heart failure) ([place]) 05/18/2015  . Sepsis ([place]) 05/09/2015  . Hypervolemia 05/09/2015  . Cellulitis of left upper extremity 03/11/2015  . QT prolongation   . Generalized weakness 10/04/2014  . Chest discomfort 10/04/2014  . GERD (gastroesophageal reflux disease) 10/04/2014  . Chronic diastolic congestive heart failure ([place]) 10/04/2014  . Impingement syndrome of right shoulder region 05/21/2014  . Severe aortic stenosis 05/14/2014  . S/P TAVR (transcatheter aortic valve  replacement) 05/14/2014  . Severe aortic insufficiency 04/05/2014  . Depression 10/02/2013  . Cellulitis of leg, left 08/01/2013  . Aortic atherosclerosis ([place]) 01/06/2013  . Paroxysmal atrial fibrillation ([place]) 01/06/2013  . Lower extremity edema 01/06/2013  . CKD (chronic kidney disease), stage III 01/06/2013  . Bacteremia 12/30/2012  . S/P AVR 12/13/2012  . Protein-calorie malnutrition, severe ([place]) 12/13/2012  . Sinus bradycardia 10/04/2011  . Hypothyroidism 02/27/2011  . Restless legs 02/27/2011  . Other pancytopenia ([place]) 12/06/2009  . HYPERURICEMIA, ASYMPTOMATIC 07/25/2009  . HYPERGLYCEMIA, FASTING 01/05/2009  . CAD, NATIVE VESSEL 12/21/2008  . Type 2 diabetes mellitus with complication ([place]) [id number]  . Aortic valve disorder 05/15/2008  . ACTION TREMOR 01/14/2007  . POPLITEAL CYST, RIGHT 11/05/2006  . Gout 05/13/2006  . Thrombocytopenia ([place]) 05/13/2006  . ANXIETY 05/13/2006  . Essential hypertension 05/13/2006  . CVA 05/13/2006  . [place] DISEASE 05/13/2006  . Splenomegaly 05/13/2006    Current Outpatient Prescriptions on File Prior to Visit  Medication Sig Dispense Refill  . allopurinol (ZYLOPRIM) 100 MG tablet Take 100 mg by mouth daily.    [name] Kitchen amiodarone (PACERONE) 200 MG tablet Take 1 tablet by mouth daily 90 tablet 0  . cephALEXin (KEFLEX) 500 MG capsule Take 1 capsule (500 mg total) by mouth 4 (four) times daily. 40 capsule 0  . Cholecalciferol (VITAMIN D) 1000 UNITS capsule Take 1 capsule (1,000 Units total) by mouth daily. 90 capsule 3  . clonazePAM (KLONOPIN) 0.5 MG tablet Take 0.25 mg by  mouth daily as needed (Take 1/2 tablet once daily as needed if you wake up early).    . colchicine 0.6 MG tablet Take 0.6 mg by mouth daily as needed (gout flares).     [name] Kitchen doxycycline (MONODOX) 100 MG capsule Take 1 capsule (100 mg total) by mouth 2 (two) times daily. 28 capsule 0  . ferrous sulfate 325 (65 FE) MG tablet Take 325 mg by mouth 2 (two) times daily with  a meal.    . fish oil-omega-3 fatty acids 1000 MG capsule Take 1 g by mouth daily.      . Flaxseed, Linseed, (FLAX SEED OIL) 1000 MG CAPS Take 1 capsule by mouth daily.     . furosemide (LASIX) 80 MG tablet Take 80mg  in the morning and 40mg  in the afternoon 60 tablet 6  . levothyroxine (SYNTHROID, LEVOTHROID) 25 MCG tablet Take 25 mcg by mouth daily before breakfast.    . metolazone (ZAROXOLYN) 2.5 MG tablet Take 1 tablet (2.5 mg total) by mouth daily as needed (edema). 30 tablet 3  . Multiple Vitamin (MULTIVITAMIN) tablet Take 1 tablet by mouth daily.      [name] Kitchen omeprazole (PRILOSEC) 20 MG capsule TAKE 1 CAPSULE EVERY DAY  (REPLACES  PANTOPRAZOLE) 90 capsule 3  . potassium chloride (K-DUR) 10 MEQ tablet Take 1 tablet (10 mEq total) by mouth daily. 90 tablet 1  . propranolol (INDERAL) 10 MG tablet Take 1 tablet (10 mg total) by mouth 2 (two) times daily. 180 tablet 3  . sertraline (ZOLOFT) 50 MG tablet TAKE 1 TABLET (50 MG TOTAL) BY MOUTH AT BEDTIME. 90 tablet 0  . traMADol (ULTRAM) 50 MG tablet Take 50 mg by mouth every 8 (eight) hours as needed (pain). Reported on 06/28/2015    . vitamin C (ASCORBIC ACID) 500 MG tablet Take 1 tablet (500 mg total) by mouth daily. 90 tablet 3   No current facility-administered medications on file prior to visit.     Past Medical History:  Diagnosis Date  . Action tremor   . Anxiety   . Aortic stenosis    a. Previously severe -> s/p minimally invasive tissue AVR with Dr. Evelina Dun at [facility] 01/2011 (pre-AVR cath with no obs CAD);  b. 05/2014 s/p TAVR (23 mm Edwards Sapien 3).  . Bacteremia    a. 12/2012 - S bovis;  b. 12/2012 TEE w/o veg;  c. Seeing ID->Rocephin therapy extended to 01/25/2013 via PICC for possible endocarditis (No veg on TEE).  . Bradycardia   . Cellulitis of left leg 10/11-16/2012  . Chronic diastolic CHF (congestive heart failure) ([place])    a. 12/15/2012 TEE: EF 60-65%, no veg.  . CKD (chronic kidney disease), stage III   . Colon polyp   .  Degenerative disc disease   . Diabetes mellitus    "borderline" (01/05/2013)  . Diverticulosis   . GERD (gastroesophageal reflux disease)   . Gout   . H/O hiatal hernia   . High cholesterol   . History of blood transfusion 01/2011; 11/2012  . Hypertension   . Hypotension   . Hypothyroidism   . Joint effusion, knee    left knee  . Leukopenia    Chronic pancytopenia  . PAF (paroxysmal atrial fibrillation) (H[facility])    a. amiodarone therapy; not felt to be a candidate for anticoagulation with pancytopenia. b. Back in AF 09/2014 - decision was made to pursue rate control only.  . Pancytopenia ([place])    a. possible chronic lymphoproliferative disorder or splenic  lymphoma.  . QT prolongation   . S/P TAVR (transcatheter aortic valve replacement)    a. 05/14/2014 TAVR: 23 mm Edwards Sapien 3 transcatheter heart valve placed valve-in-valve for prosthetic valve dysfunction via open right transfemoral approach  . Splenomegaly   . Stroke ([place])   . Synovial cyst of popliteal space   . Throat cancer ([place])    s/p lasered  . Thrombocytopenia (H[facility])    Dr. Benay Spice    Past Surgical History:  Procedure Laterality Date  . AORTIC VALVE REPLACEMENT  01/23/2011   via minimally invasive approach per Dr Evelina Dun, [facility]  . CARDIAC CATHETERIZATION    . CARDIAC VALVE REPLACEMENT    . CHOLECYSTECTOMY    . EXCISIONAL HEMORRHOIDECTOMY    . INGUINAL HERNIA REPAIR Right   . joint effusion     left knee  . LESION REMOVAL Right 08/02/2015   Procedure: EXCISION  RIGHT EAR SKIN CANCER;  Surgeon: Rozetta Nunnery, MD;  Location: [place];  Service: ENT;  Laterality: Right;  LOCAL  . 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 SURGERY SCROTAL / TESTICULAR     "removed one" (01/05/2013)  . TEE WITHOUT CARDIOVERSION N/A 12/15/2012   Procedure: TRANSESOPHAGEAL  ECHOCARDIOGRAM (TEE);  Surgeon: [name], MD;  Location: [place];  Service: Cardiovascular;  Laterality: N/A;  . TEE WITHOUT CARDIOVERSION N/A 03/26/2014   Procedure: TRANSESOPHAGEAL ECHOCARDIOGRAM (TEE);  Surgeon: Josue Hector, MD;  Location: [place];  Service: Cardiovascular;  Laterality: N/A;  . TEE WITHOUT CARDIOVERSION N/A 05/14/2014   Procedure: TRANSESOPHAGEAL ECHOCARDIOGRAM (TEE);  Surgeon: [name], MD;  Location: [place];  Service: Open Heart Surgery;  Laterality: N/A;  . TRANSCATHETER AORTIC VALVE REPLACEMENT, TRANSFEMORAL Right 05/14/2014   Procedure: TRANSCATHETER AORTIC VALVE REPLACEMENT, TRANSFEMORAL;  Surgeon: [name], MD;  Location: [place];  Service: Open Heart Surgery;  Laterality: Right;    Social History   Social History  . Marital status: Widowed    Spouse name: N/A  . Number of children: 3  . Years of education: N/A   Occupational History  . Retired Agricultural consultant Retired   Social History Main Topics  . Smoking status: Former Smoker    Packs/day: 0.75    Years: 24.00    Types: Cigarettes    Quit date: 01/11/1962  . Smokeless tobacco: Never Used  . Alcohol use No  . Drug use: No  . Sexual activity: No   Other Topics Concern  . Not on file   Social History Narrative   [place] at his farm outside of [place]   Lives with his wife   Smoked until 1964 about 7 cigarettes a day for 30 years   No alcohol history.   No drug history   Very active, no regimented exercise                Family History  Problem Relation Age of Onset  . Lung cancer Mother   . Heart disease Father   . Colon cancer Other   . Stroke Other   . Cancer Brother     Review of Systems  Constitutional: Negative for appetite change, chills, diaphoresis and fever.  Respiratory: Negative for shortness of breath.   Cardiovascular: Negative for chest pain.  Gastrointestinal: Negative for abdominal pain and nausea.  Neurological: Negative for dizziness, light-headedness  and headaches.       Objective:   Vitals:   08/31/15 1423  BP: (!) 114/58  Pulse: 79  Resp: 16  Temp: 97.7 F (36.5 C)   Filed Weights   08/31/15 1423  Weight: 180 lb (81.6 kg)   Body mass index is 25.83 kg/m.   Physical Exam  Constitutional: He is oriented to person, place, and time. No distress.  Elderly, chronically ill-appearing  Musculoskeletal: He exhibits edema.  Neurological: He is alert and oriented to person, place, and time.  Skin: He is not diaphoretic.  Left lower extremity: 2+ pitting edema-slightly improved; dusky red appearance-improved from 2 days ago, decreased warmth and tenderness compared to 2 days ago, skin break posterior upper left leg with mild surrounding erythema, no active discharge; Right lower extremity: Minimal edema, recent skin laceration medial aspect of knee from today, no active bleeding, no active discharge, no surrounding erythema, surrounding bruising  Psychiatric: He has a normal mood and affect. His behavior is normal.          Assessment & Plan:   See Problem List for Assessment and Plan of chronic medical problems.

## 2015-08-31 NOTE — Assessment & Plan Note (Signed)
Left posterior leg-healing with minimal surrounding erythema New wound to right medial knee today-no active infection. He will continue local wound care and monitor closely

## 2015-09-01 ENCOUNTER — Ambulatory Visit: Payer: Commercial Managed Care - HMO | Admitting: Cardiovascular Disease

## 2015-09-01 ENCOUNTER — Telehealth: Payer: Self-pay | Admitting: Internal Medicine

## 2015-09-01 NOTE — Telephone Encounter (Signed)
Pt called stating his leg is hot and he is not feeling any better. Please call pt back

## 2015-09-01 NOTE — Telephone Encounter (Signed)
Spoke with pt to inform.  

## 2015-09-01 NOTE — Telephone Encounter (Signed)
He needs to be elevating the leg as much as possible --- if these antibiotics are not working he may have to go back into the hospital.

## 2015-09-01 NOTE — Telephone Encounter (Signed)
Please advise 

## 2015-09-02 ENCOUNTER — Ambulatory Visit (INDEPENDENT_AMBULATORY_CARE_PROVIDER_SITE_OTHER): Payer: Commercial Managed Care - HMO | Admitting: Cardiovascular Disease

## 2015-09-02 ENCOUNTER — Encounter: Payer: Self-pay | Admitting: Cardiovascular Disease

## 2015-09-02 ENCOUNTER — Telehealth: Payer: Self-pay

## 2015-09-02 VITALS — BP 122/68 | HR 76 | Ht 70.0 in | Wt 179.0 lb

## 2015-09-02 DIAGNOSIS — I5032 Chronic diastolic (congestive) heart failure: Secondary | ICD-10-CM

## 2015-09-02 DIAGNOSIS — I359 Nonrheumatic aortic valve disorder, unspecified: Secondary | ICD-10-CM | POA: Diagnosis not present

## 2015-09-02 NOTE — Patient Instructions (Signed)
Medication Instructions:  Your physician recommends that you continue on your current medications as directed. Please refer to the Current Medication list given to you today.  Labwork: No new orders.   Testing/Procedures: No new orders.   Follow-Up: Your physician recommends that you schedule a follow-up appointment in: 3 MONTHS with Dr Cooper   Any Other Special Instructions Will Be Listed Below (If Applicable).     If you need a refill on your cardiac medications before your next appointment, please call your pharmacy.   

## 2015-09-02 NOTE — Telephone Encounter (Signed)
Pt was seen twice this week. Aware of condition

## 2015-09-02 NOTE — Telephone Encounter (Signed)
FYI: Home Health called in and stated they wanted to make sure that we knew about his L leg pain and that it hurts when its touched. I informed her he was here on Monday for this issues.

## 2015-09-02 NOTE — Progress Notes (Signed)
Cardiology Office Note Date:  09/02/2015   ID:  BANKSTON MUZYKA, DOB 1922/03/30, MRN [id number]  PCP:  Binnie Rail, MD  Cardiologist:  [name], MD    Chief Complaint  Patient presents with  . Follow-up    CHF   History of Present Illness: [name] is a 82 y.o. male who presents for Follow-up evaluation.  The patient was hospitalized August 2 through August 9 with septic shock related to left leg cellulitis.  He is still having problems with his left leg. He's back on antibiotics now after evaluation by his PCP. He denies systemic symptoms.   He has a history of aortic valve disease (s/p AVR 01/2011 with severe bioprosthetic valve dysfunction leading to valve-in-valve TAVR 05/2014), PAF, chronic pancytopenia/splenomegaly (followed by Dr. Benay Spice), possible chronic lymphoproliferative disorder or splenic lymphoma,stroke, CKD stage III, chronic diastolic CHF, hypertension, diet-controlled diabetes, GERD, hypothyroidism, gout, and depression. He has CKD Stage III with most recent creatinine 1.64 mg/dL. Most recent blood counts show WBC 1.7, HgB 8.6, and plt count 23,000.   No new cardiac symptoms. Denies chest pain, orthopnea, or PND. Leg swelling is unchanged and he continues to wear compression hose. Remarkably, he continue to mow his 2 acre yard with a riding mower.   Past Medical History:  Diagnosis Date  . Action tremor   . Anxiety   . Aortic stenosis    a. Previously severe -> s/p minimally invasive tissue AVR with Dr. Evelina Dun at [facility] 01/2011 (pre-AVR cath with no obs CAD);  b. 05/2014 s/p TAVR (23 mm Edwards Sapien 3).  . Bacteremia    a. 12/2012 - S bovis;  b. 12/2012 TEE w/o veg;  c. Seeing ID->Rocephin therapy extended to 01/25/2013 via PICC for possible endocarditis (No veg on TEE).  . Bradycardia   . Cellulitis of left leg 10/11-16/2012  . Chronic diastolic CHF (congestive heart failure) ([place])    a. 12/15/2012 TEE: EF 60-65%, no veg.  . CKD (chronic kidney  disease), stage III   . Colon polyp   . Degenerative disc disease   . Diabetes mellitus    "borderline" (01/05/2013)  . Diverticulosis   . GERD (gastroesophageal reflux disease)   . Gout   . H/O hiatal hernia   . High cholesterol   . History of blood transfusion 01/2011; 11/2012  . Hypertension   . Hypotension   . Hypothyroidism   . Joint effusion, knee    left knee  . Leukopenia    Chronic pancytopenia  . PAF (paroxysmal atrial fibrillation) (H[facility])    a. amiodarone therapy; not felt to be a candidate for anticoagulation with pancytopenia. b. Back in AF 09/2014 - decision was made to pursue rate control only.  . Pancytopenia ([place])    a. possible chronic lymphoproliferative disorder or splenic lymphoma.  . QT prolongation   . S/P TAVR (transcatheter aortic valve replacement)    a. 05/14/2014 TAVR: 23 mm Edwards Sapien 3 transcatheter heart valve placed valve-in-valve for prosthetic valve dysfunction via open right transfemoral approach  . Splenomegaly   . Stroke ([place])   . Synovial cyst of popliteal space   . Throat cancer ([place])    s/p lasered  . Thrombocytopenia (H[facility])    Dr. Benay Spice    Past Surgical History:  Procedure Laterality Date  . AORTIC VALVE REPLACEMENT  01/23/2011   via minimally invasive approach per Dr Evelina Dun, [facility]  . CARDIAC CATHETERIZATION    . CARDIAC VALVE REPLACEMENT    .  CHOLECYSTECTOMY    . EXCISIONAL HEMORRHOIDECTOMY    . INGUINAL HERNIA REPAIR Right   . joint effusion     left knee  . LESION REMOVAL Right 08/02/2015   Procedure: EXCISION  RIGHT EAR SKIN CANCER;  Surgeon: Rozetta Nunnery, MD;  Location: [place];  Service: ENT;  Laterality: Right;  LOCAL  . 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 SURGERY SCROTAL / TESTICULAR     "removed one" (01/05/2013)  . TEE WITHOUT CARDIOVERSION N/A  12/15/2012   Procedure: TRANSESOPHAGEAL ECHOCARDIOGRAM (TEE);  Surgeon: [name], MD;  Location: [place];  Service: Cardiovascular;  Laterality: N/A;  . TEE WITHOUT CARDIOVERSION N/A 03/26/2014   Procedure: TRANSESOPHAGEAL ECHOCARDIOGRAM (TEE);  Surgeon: Josue Hector, MD;  Location: [place];  Service: Cardiovascular;  Laterality: N/A;  . TEE WITHOUT CARDIOVERSION N/A 05/14/2014   Procedure: TRANSESOPHAGEAL ECHOCARDIOGRAM (TEE);  Surgeon: [name], MD;  Location: [place];  Service: Open Heart Surgery;  Laterality: N/A;  . TRANSCATHETER AORTIC VALVE REPLACEMENT, TRANSFEMORAL Right 05/14/2014   Procedure: TRANSCATHETER AORTIC VALVE REPLACEMENT, TRANSFEMORAL;  Surgeon: [name], MD;  Location: [place];  Service: Open Heart Surgery;  Laterality: Right;    Current Outpatient Prescriptions  Medication Sig Dispense Refill  . allopurinol (ZYLOPRIM) 100 MG tablet Take 100 mg by mouth daily.    [name] Kitchen amiodarone (PACERONE) 200 MG tablet Take 1 tablet by mouth daily 90 tablet 0  . cephALEXin (KEFLEX) 500 MG capsule Take 1 capsule (500 mg total) by mouth 4 (four) times daily. 40 capsule 0  . Cholecalciferol (VITAMIN D) 1000 UNITS capsule Take 1 capsule (1,000 Units total) by mouth daily. 90 capsule 3  . clonazePAM (KLONOPIN) 0.5 MG tablet Take 0.25 mg by mouth daily as needed (Take 1/2 tablet once daily as needed if you wake up early).    . colchicine 0.6 MG tablet Take 0.6 mg by mouth daily as needed (gout flares).     [name] Kitchen doxycycline (MONODOX) 100 MG capsule Take 1 capsule (100 mg total) by mouth 2 (two) times daily. 28 capsule 0  . ferrous sulfate 325 (65 FE) MG tablet Take 325 mg by mouth 2 (two) times daily with a meal.    . fish oil-omega-3 fatty acids 1000 MG capsule Take 1 g by mouth daily.      . Flaxseed, Linseed, (FLAX SEED OIL) 1000 MG CAPS Take 1 capsule by mouth daily.     . furosemide (LASIX) 80 MG tablet Take 80mg  in the morning and 40mg  in the afternoon 60 tablet 6  .  levothyroxine (SYNTHROID, LEVOTHROID) 25 MCG tablet Take 25 mcg by mouth daily before breakfast.    . metolazone (ZAROXOLYN) 2.5 MG tablet Take 1 tablet (2.5 mg total) by mouth daily as needed (edema). 30 tablet 3  . Multiple Vitamin (MULTIVITAMIN) tablet Take 1 tablet by mouth daily.      [name] Kitchen omeprazole (PRILOSEC) 20 MG capsule TAKE 1 CAPSULE EVERY DAY  (REPLACES  PANTOPRAZOLE) 90 capsule 3  . potassium chloride (K-DUR) 10 MEQ tablet Take 1 tablet (10 mEq total) by mouth daily. 90 tablet 1  . propranolol (INDERAL) 10 MG tablet Take 1 tablet (10 mg total) by mouth 2 (two) times daily. 180 tablet 3  . sertraline (ZOLOFT) 50 MG tablet TAKE 1 TABLET (50 MG TOTAL) BY  MOUTH AT BEDTIME. 90 tablet 0  . traMADol (ULTRAM) 50 MG tablet Take 50 mg by mouth every 8 (eight) hours as needed (pain). Reported on 06/28/2015    . vitamin C (ASCORBIC ACID) 500 MG tablet Take 1 tablet (500 mg total) by mouth daily. 90 tablet 3   No current facility-administered medications for this visit.     Allergies:   Penicillins and Neomycin-bacitracin zn-polymyx   Social History:  The patient  reports that he quit smoking about 53 years ago. His smoking use included Cigarettes. He has a 18.00 pack-year smoking history. He has never used smokeless tobacco. He reports that he does not drink alcohol or use drugs.   Family History:  [name]  family history includes Cancer in his brother; Colon cancer in his other; Heart disease in his father; Lung cancer in his mother; Stroke in his other.    ROS:  Please see the history of present illness.  Otherwise, review of systems is positive for leg swelling, hearing loss.  All other systems are reviewed and negative.    PHYSICAL EXAM: VS:  BP 122/68   Pulse 76   Ht 5\' 10"  (1.778 m)   Wt 81.2 kg (179 lb)   BMI 25.68 kg/m  , BMI Body mass index is 25.68 kg/m. GEN: pleasant elderly male, in no acute distress  HEENT: normal  Neck: mild JVD, no masses.  Cardiac: irregularly  irregular with 2/6 SEM at the LLSB                Respiratory:  clear to auscultation bilaterally, normal work of breathing GI: soft, nontender, nondistended, + BS MS: no deformity or atrophy  Ext: 1+ bilateral pretibial edema Skin: left leg stasis changes and rash noted Neuro:  Strength and sensation are intact Psych: euthymic mood, flat affect  EKG:  EKG is not ordered today.  Recent Labs: 05/09/2015: Pro B Natriuretic peptide (BNP) 410.0 05/17/2015: B Natriuretic Peptide 674.3 05/18/2015: TSH 2.664 08/14/2015: ALT 34 08/15/2015: Hemoglobin 8.6; Platelets 23 08/16/2015: BUN 43; Creatinine, Ser 1.64; Magnesium 1.9; Potassium 5.0; Sodium 136   Lipid Panel     Component Value Date/Time   CHOL 84 09/21/2013 0809   TRIG 94.0 09/21/2013 0809   HDL 25.60 (L) 09/21/2013 0809   CHOLHDL 3 09/21/2013 0809   VLDL 18.8 09/21/2013 0809   LDLCALC 40 09/21/2013 0809      Wt Readings from Last 3 Encounters:  09/02/15 81.2 kg (179 lb)  08/31/15 81.6 kg (180 lb)  08/29/15 82.1 kg (181 lb)    Cardiac Studies Reviewed: Echo 05-30-2015: Study Conclusions  - Left ventricle: The cavity size was normal. There was moderate   concentric hypertrophy. Systolic function was normal. The   estimated ejection fraction was in the range of 60% to 65%. Wall   motion was normal; there were no regional wall motion   abnormalities. Doppler parameters are consistent with high   ventricular filling pressure. - Aortic valve: Transvalvular gradient is increased. A   bioprosthesis was present and functioning normally. There was no   regurgitation. Mean gradient (S): 25 mm Hg. Valve area (VTI):   1.29 cm^2. Valve area (Vmax): 1.13 cm^2. Valve area (Vmean): 1.32   cm^2. - Aorta: Aortic root dimension: 45 mm (ED). - Aortic root: The aortic root was moderately dilated. - Mitral valve: Severely calcified annulus. Mild diffuse   calcification of the anterior leaflet and posterior leaflet.   There was trivial  regurgitation. Valve area by pressure  half-time: 2.47 cm^2. Valve area by continuity equation (using   LVOT flow): 1.78 cm^2. - Left atrium: The atrium was severely dilated. - Tricuspid valve: There was mild regurgitation. - Pulmonary arteries: PA peak pressure: 35 mm Hg (S).  Impressions:  - Compared to study of 10/04/2014, the mean AV velocity has actually   decreased to 68mmHg (was 25mmHg). The right ventricular systolic   pressure was increased consistent with mild pulmonary   hypertension.   ASSESSMENT AND PLAN: 1.  Chronic diastolic heart failure, NYHA II: remarkably stable from a cardiac perspective. No change in medications recommended. Weights reviewed and unchanged from baseline.   2. Aortic valve disease s/p valve-in-valve TAVR: most recent echo reviewed with essentially normal valve function.   3. Paroxysmal atrial fibrillation: continue current Rx. Not a candidate for anticoagulation because of severe thrombocytopenia.  Current medicines are reviewed with the patient today.  The patient does not have concerns regarding medicines.  Labs/ tests ordered today include:  No orders of the defined types were placed in this encounter.   Disposition:   FU 3 months  Signed, [name], MD  09/02/2015 9:31 AM    [facility] [place], [place], [place]  [address] Phone: [phone]; Fax: [phone]

## 2015-09-03 ENCOUNTER — Telehealth: Payer: Self-pay | Admitting: Internal Medicine

## 2015-09-03 NOTE — Telephone Encounter (Signed)
Please call patient and see how his leg is.  He may need to come in this week for follow up.

## 2015-09-05 NOTE — Telephone Encounter (Signed)
Spoke with pt to inform that he should be keeping a close watch on his leg, will need to be seen if any changes. States there is only a temp to the wound, no signs of infection.

## 2015-09-05 NOTE — Telephone Encounter (Signed)
LVM for pt to call back.

## 2015-09-06 ENCOUNTER — Other Ambulatory Visit: Payer: Self-pay

## 2015-09-06 NOTE — Patient Outreach (Signed)
[facility]) Care Management  09/06/2015  ISIDRO NEWSWANGER [birth year] [id number]     Telephone Screen  Referral Date:09/05/15 Referral Source: [facility]([employer]) Referral Reason: "ongoing disease and symptom management, high risk for readmission, recent admit for sepsis related to cellulitis, CHD, DM, Renal disease"    Outreach attempt #1 to patient. Patient reached. Screening completed.  Social: Patient reports he resides in his home alone. He is widowed. He states that his dtr is next of kin. He voices that he is independent with all ADLs/IADLs. Denies any recent falls. He is able to ambulate unassisted. DME in the home include cbg meter and scale.  Conditions: Patient has h/o HTN,DM, CKD stage 3, stroke, GERD, aortic valve disorder, splenic lymphoma, Gout, depression and hypothyroidism per records. He states that he is monitoring cgs daily. CBG this am was 174. Patient reports that he does not currently take any diabetic meds. He is weighing every morning and reports weigh fluctuates slightly. Edema is improving per patient report. He is still having ongoing issues with left leg cellulitis and is monitoring area.   Medications: Patient states he takes about 10 meds. He manages his meds himself. He denies any issues with affordability.  Appointments: Patient reports he has been to MD three times within the last week regarding his leg cellulitis.  Consent: [facility] services discussed. Patient gave verbal consent for [facility] services. He states he is planning a beach trip and possibly leaving 09/08/15 and returning 09/14/15 but can be reached via his cell phone during that time.   Plan: RN CM will notify [facility] administrative assistant of case status. RN CM will send [facility] community referral for TOC.     Enzo Montgomery, RN,BSN,CCM [place] [facility] Telephonic Care Management Coordinator Direct Phone: [phone] Toll Free: [phone] Fax: [phone]

## 2015-09-07 ENCOUNTER — Other Ambulatory Visit: Payer: Self-pay

## 2015-09-07 NOTE — Patient Outreach (Signed)
New referral: Transition of care referral from [facility] for sepsis. Discharged on 08/17/2015.  Placed call to patient's home number. No answer. Left a generic message requesting a return phone call. Placed call to cell phone. No answer and no voicemail set up.  PLAN:Will continue to outreach patient. Noted in previous telephonic nurse assessment that patient will be on vacation from 8/31- 9/6.  Tomasa Rand, RN, BSN, CEN [facility] [employer] [phone]

## 2015-09-07 NOTE — Patient Outreach (Signed)
Transition of care: Patient returned call and states that he is doing well. Reports that he has been outside mowing grass. States that his leg is getting better. Reports that he has all his medications and is doing well.  Denies any needs at this time. Reports that he is leaving for vacation tomorrow.  Patient unable to review his medications but states he has no problems with his medications. Reports CBG today of 174. States that he is not on any medications for diabetes.  Offered home visit for assessment on 09/16/2015 and patient has accepted. Provided my contact information and confirmed patient's address.   [facility] CM Care Plan Problem One   Flowsheet Row Most Recent Value  Care Plan Problem One  Recent hospital admission related to cellulitis of leg  Role Documenting the Problem One  Care Management [place] for Problem One  Active  [facility] Long Term Goal (31-90 days)  Patient will report no readmissions in the next 31 days.  [facility] Long Term Goal Start Date  09/07/15  Interventions for Problem One Long Term Goal  Reviewed importance of calling Md for any new or worsening concern for leg. Home visit planned for 09/16/2015  [facility] CM Short Term Goal #1 (0-30 days)  Patient will record CBG daily for the next 30 days.  [facility] CM Short Term Goal #1 Start Date  09/07/15  Interventions for Short Term Goal #1  reviewed importance of daily monitoring of CBG. Home visit planned.  [facility] CM Short Term Goal #2 (0-30 days)  Patient will call MD for worsening leg infection in the next 30 days.  [facility] CM Short Term Goal #2 Start Date  09/07/15  Interventions for Short Term Goal #2  reviewed reasons to call Md, reddness, increase warmth, swelling or increase pain or drainage.      PLAN: Home visit planned for 09/16/2015. Will send involvement letter to MD.  Tomasa Rand, RN, BSN, CEN [place] Coordinator [phone]

## 2015-09-09 ENCOUNTER — Ambulatory Visit (INDEPENDENT_AMBULATORY_CARE_PROVIDER_SITE_OTHER): Payer: Commercial Managed Care - HMO | Admitting: Family

## 2015-09-09 ENCOUNTER — Inpatient Hospital Stay (HOSPITAL_COMMUNITY)
Admission: EM | Admit: 2015-09-09 | Discharge: 2015-09-23 | DRG: 603 | Disposition: A | Discharge status: Home Health | Payer: Commercial Managed Care - HMO | Attending: Internal Medicine | Admitting: Internal Medicine

## 2015-09-09 ENCOUNTER — Encounter (HOSPITAL_COMMUNITY): Payer: Self-pay | Admitting: Emergency Medicine

## 2015-09-09 ENCOUNTER — Encounter: Payer: Self-pay | Admitting: Family

## 2015-09-09 DIAGNOSIS — I480 Paroxysmal atrial fibrillation: Secondary | ICD-10-CM | POA: Diagnosis present

## 2015-09-09 DIAGNOSIS — I959 Hypotension, unspecified: Secondary | ICD-10-CM | POA: Diagnosis present

## 2015-09-09 DIAGNOSIS — N183 Chronic kidney disease, stage 3 unspecified: Secondary | ICD-10-CM

## 2015-09-09 DIAGNOSIS — E039 Hypothyroidism, unspecified: Secondary | ICD-10-CM | POA: Diagnosis present

## 2015-09-09 DIAGNOSIS — I130 Hypertensive heart and chronic kidney disease with heart failure and stage 1 through stage 4 chronic kidney disease, or unspecified chronic kidney disease: Secondary | ICD-10-CM | POA: Diagnosis present

## 2015-09-09 DIAGNOSIS — K469 Unspecified abdominal hernia without obstruction or gangrene: Secondary | ICD-10-CM

## 2015-09-09 DIAGNOSIS — I5032 Chronic diastolic (congestive) heart failure: Secondary | ICD-10-CM | POA: Diagnosis not present

## 2015-09-09 DIAGNOSIS — Z823 Family history of stroke: Secondary | ICD-10-CM

## 2015-09-09 DIAGNOSIS — Z954 Presence of other heart-valve replacement: Secondary | ICD-10-CM

## 2015-09-09 DIAGNOSIS — M79605 Pain in left leg: Secondary | ICD-10-CM | POA: Diagnosis not present

## 2015-09-09 DIAGNOSIS — I272 Other secondary pulmonary hypertension: Secondary | ICD-10-CM | POA: Diagnosis present

## 2015-09-09 DIAGNOSIS — E875 Hyperkalemia: Secondary | ICD-10-CM | POA: Diagnosis not present

## 2015-09-09 DIAGNOSIS — K219 Gastro-esophageal reflux disease without esophagitis: Secondary | ICD-10-CM | POA: Diagnosis present

## 2015-09-09 DIAGNOSIS — E1122 Type 2 diabetes mellitus with diabetic chronic kidney disease: Secondary | ICD-10-CM | POA: Diagnosis present

## 2015-09-09 DIAGNOSIS — M109 Gout, unspecified: Secondary | ICD-10-CM | POA: Diagnosis present

## 2015-09-09 DIAGNOSIS — L03116 Cellulitis of left lower limb: Secondary | ICD-10-CM

## 2015-09-09 DIAGNOSIS — T380X5A Adverse effect of glucocorticoids and synthetic analogues, initial encounter: Secondary | ICD-10-CM | POA: Diagnosis present

## 2015-09-09 DIAGNOSIS — L0390 Cellulitis, unspecified: Secondary | ICD-10-CM

## 2015-09-09 DIAGNOSIS — F329 Major depressive disorder, single episode, unspecified: Secondary | ICD-10-CM | POA: Diagnosis present

## 2015-09-09 DIAGNOSIS — I10 Essential (primary) hypertension: Secondary | ICD-10-CM

## 2015-09-09 DIAGNOSIS — K4090 Unilateral inguinal hernia, without obstruction or gangrene, not specified as recurrent: Secondary | ICD-10-CM

## 2015-09-09 DIAGNOSIS — Z85819 Personal history of malignant neoplasm of unspecified site of lip, oral cavity, and pharynx: Secondary | ICD-10-CM

## 2015-09-09 DIAGNOSIS — D479 Neoplasm of uncertain behavior of lymphoid, hematopoietic and related tissue, unspecified: Secondary | ICD-10-CM | POA: Diagnosis present

## 2015-09-09 DIAGNOSIS — M659 Synovitis and tenosynovitis, unspecified: Secondary | ICD-10-CM | POA: Diagnosis present

## 2015-09-09 DIAGNOSIS — L03119 Cellulitis of unspecified part of limb: Secondary | ICD-10-CM | POA: Diagnosis present

## 2015-09-09 DIAGNOSIS — Z952 Presence of prosthetic heart valve: Secondary | ICD-10-CM

## 2015-09-09 DIAGNOSIS — E118 Type 2 diabetes mellitus with unspecified complications: Secondary | ICD-10-CM | POA: Diagnosis present

## 2015-09-09 DIAGNOSIS — F419 Anxiety disorder, unspecified: Secondary | ICD-10-CM | POA: Diagnosis present

## 2015-09-09 DIAGNOSIS — E1165 Type 2 diabetes mellitus with hyperglycemia: Secondary | ICD-10-CM | POA: Diagnosis present

## 2015-09-09 DIAGNOSIS — E872 Acidosis, unspecified: Secondary | ICD-10-CM | POA: Diagnosis not present

## 2015-09-09 DIAGNOSIS — Z953 Presence of xenogenic heart valve: Secondary | ICD-10-CM

## 2015-09-09 DIAGNOSIS — M79606 Pain in leg, unspecified: Secondary | ICD-10-CM

## 2015-09-09 DIAGNOSIS — M1A9XX0 Chronic gout, unspecified, without tophus (tophi): Secondary | ICD-10-CM | POA: Diagnosis present

## 2015-09-09 DIAGNOSIS — N433 Hydrocele, unspecified: Secondary | ICD-10-CM | POA: Diagnosis present

## 2015-09-09 DIAGNOSIS — F32A Depression, unspecified: Secondary | ICD-10-CM | POA: Diagnosis present

## 2015-09-09 DIAGNOSIS — D619 Aplastic anemia, unspecified: Secondary | ICD-10-CM | POA: Diagnosis present

## 2015-09-09 DIAGNOSIS — Z8673 Personal history of transient ischemic attack (TIA), and cerebral infarction without residual deficits: Secondary | ICD-10-CM

## 2015-09-09 DIAGNOSIS — D61818 Other pancytopenia: Secondary | ICD-10-CM | POA: Diagnosis present

## 2015-09-09 HISTORY — DX: Cellulitis, unspecified: L03.90

## 2015-09-09 LAB — CBC WITH DIFFERENTIAL/PLATELET
BASOS ABS: 0 10*3/uL (ref 0.0–0.1)
BASOS PCT: 1 %
Eosinophils Absolute: 0 10*3/uL (ref 0.0–0.7)
Eosinophils Relative: 1 %
HEMATOCRIT: 31.2 % — AB (ref 39.0–52.0)
Hemoglobin: 10.4 g/dL — ABNORMAL LOW (ref 13.0–17.0)
LYMPHS PCT: 15 %
Lymphs Abs: 0.5 10*3/uL — ABNORMAL LOW (ref 0.7–4.0)
MCH: 29.1 pg (ref 26.0–34.0)
MCHC: 33.3 g/dL (ref 30.0–36.0)
MCV: 87.4 fL (ref 78.0–100.0)
MONO ABS: 0.3 10*3/uL (ref 0.1–1.0)
Monocytes Relative: 8 %
NEUTROS ABS: 2.4 10*3/uL (ref 1.7–7.7)
NEUTROS PCT: 75 %
Platelets: 31 10*3/uL — ABNORMAL LOW (ref 150–400)
RBC: 3.57 MIL/uL — AB (ref 4.22–5.81)
RDW: 17.2 % — AB (ref 11.5–15.5)
WBC: 3.2 10*3/uL — AB (ref 4.0–10.5)

## 2015-09-09 LAB — SEDIMENTATION RATE: Sed Rate: 7 mm/hr (ref 0–16)

## 2015-09-09 LAB — BASIC METABOLIC PANEL
ANION GAP: 10 (ref 5–15)
BUN: 64 mg/dL — ABNORMAL HIGH (ref 6–20)
CALCIUM: 9 mg/dL (ref 8.9–10.3)
CHLORIDE: 104 mmol/L (ref 101–111)
CO2: 21 mmol/L — ABNORMAL LOW (ref 22–32)
CREATININE: 1.9 mg/dL — AB (ref 0.61–1.24)
GFR calc non Af Amer: 29 mL/min — ABNORMAL LOW (ref >60)
GFR, EST AFRICAN AMERICAN: 34 mL/min — AB (ref >60)
Glucose, Bld: 159 mg/dL — ABNORMAL HIGH (ref 65–99)
Potassium: 4.1 mmol/L (ref 3.5–5.1)
SODIUM: 135 mmol/L (ref 135–145)

## 2015-09-09 LAB — GLUCOSE, CAPILLARY
GLUCOSE-CAPILLARY: 149 mg/dL — AB (ref 65–99)
GLUCOSE-CAPILLARY: 202 mg/dL — AB (ref 65–99)

## 2015-09-09 LAB — I-STAT CG4 LACTIC ACID, ED: LACTIC ACID, VENOUS: 1.52 mmol/L (ref 0.5–1.9)

## 2015-09-09 LAB — C-REACTIVE PROTEIN: CRP: 1.9 mg/dL — AB (ref <1.0)

## 2015-09-09 MED ORDER — VANCOMYCIN HCL IN DEXTROSE 750-5 MG/150ML-% IV SOLN
750.0000 mg | INTRAVENOUS | Status: DC
  Administered 2015-09-10 – 2015-09-19 (×10): 750 mg via INTRAVENOUS
  Filled 2015-09-09 (×13): qty 150

## 2015-09-09 MED ORDER — VANCOMYCIN HCL 10 G IV SOLR
1500.0000 mg | Freq: Once | INTRAVENOUS | Status: AC
  Administered 2015-09-09: 1500 mg via INTRAVENOUS
  Filled 2015-09-09: qty 1500

## 2015-09-09 MED ORDER — SERTRALINE HCL 50 MG PO TABS
50.0000 mg | ORAL_TABLET | Freq: Every day | ORAL | Status: DC
  Administered 2015-09-09 – 2015-09-22 (×14): 50 mg via ORAL
  Filled 2015-09-09 (×14): qty 1

## 2015-09-09 MED ORDER — LEVOTHYROXINE SODIUM 25 MCG PO TABS
25.0000 ug | ORAL_TABLET | Freq: Every day | ORAL | Status: DC
  Administered 2015-09-10 – 2015-09-23 (×13): 25 ug via ORAL
  Filled 2015-09-09 (×13): qty 1

## 2015-09-09 MED ORDER — VANCOMYCIN HCL IN DEXTROSE 1-5 GM/200ML-% IV SOLN: 1000.0000 mg | Freq: Once | INTRAVENOUS | Status: DC

## 2015-09-09 MED ORDER — CLONAZEPAM 0.5 MG PO TABS
0.2500 mg | ORAL_TABLET | Freq: Every day | ORAL | Status: DC | PRN
  Administered 2015-09-21 – 2015-09-22 (×2): 0.25 mg via ORAL
  Filled 2015-09-09 (×2): qty 1

## 2015-09-09 MED ORDER — SODIUM CHLORIDE 0.9 % IV BOLUS (SEPSIS)
500.0000 mL | Freq: Once | INTRAVENOUS | Status: AC
  Administered 2015-09-09: 500 mL via INTRAVENOUS

## 2015-09-09 MED ORDER — ACETAMINOPHEN 325 MG PO TABS
650.0000 mg | ORAL_TABLET | Freq: Four times a day (QID) | ORAL | Status: DC | PRN
  Administered 2015-09-10 – 2015-09-17 (×14): 650 mg via ORAL
  Filled 2015-09-09 (×14): qty 2

## 2015-09-09 MED ORDER — ACETAMINOPHEN 650 MG RE SUPP
650.0000 mg | Freq: Four times a day (QID) | RECTAL | Status: DC | PRN
Start: 2015-09-09 — End: 2015-09-23

## 2015-09-09 MED ORDER — AMIODARONE HCL 100 MG PO TABS
200.0000 mg | ORAL_TABLET | Freq: Every day | ORAL | Status: DC
  Administered 2015-09-10 – 2015-09-22 (×12): 200 mg via ORAL
  Filled 2015-09-09 (×14): qty 2

## 2015-09-09 MED ORDER — PROPRANOLOL HCL 10 MG PO TABS
10.0000 mg | ORAL_TABLET | Freq: Two times a day (BID) | ORAL | Status: DC
  Administered 2015-09-09 – 2015-09-10 (×2): 10 mg via ORAL
  Filled 2015-09-09 (×4): qty 1

## 2015-09-09 MED ORDER — ALLOPURINOL 100 MG PO TABS
100.0000 mg | ORAL_TABLET | Freq: Every day | ORAL | Status: DC
  Administered 2015-09-10 – 2015-09-12 (×3): 100 mg via ORAL
  Filled 2015-09-09 (×3): qty 1

## 2015-09-09 MED ORDER — TRAMADOL HCL 50 MG PO TABS
50.0000 mg | ORAL_TABLET | Freq: Four times a day (QID) | ORAL | Status: DC | PRN
  Administered 2015-09-10 – 2015-09-22 (×18): 50 mg via ORAL
  Filled 2015-09-09 (×18): qty 1

## 2015-09-09 MED ORDER — FUROSEMIDE 40 MG PO TABS
40.0000 mg | ORAL_TABLET | Freq: Two times a day (BID) | ORAL | Status: DC
  Administered 2015-09-09 – 2015-09-10 (×2): 40 mg via ORAL
  Filled 2015-09-09 (×2): qty 1

## 2015-09-09 MED ORDER — INSULIN ASPART 100 UNIT/ML ~~LOC~~ SOLN
0.0000 [IU] | Freq: Every day | SUBCUTANEOUS | Status: DC
  Administered 2015-09-09 – 2015-09-13 (×2): 2 [IU] via SUBCUTANEOUS
  Administered 2015-09-14: 3 [IU] via SUBCUTANEOUS
  Administered 2015-09-15 – 2015-09-16 (×2): 2 [IU] via SUBCUTANEOUS
  Administered 2015-09-18: 3 [IU] via SUBCUTANEOUS

## 2015-09-09 MED ORDER — INSULIN ASPART 100 UNIT/ML ~~LOC~~ SOLN
0.0000 [IU] | Freq: Three times a day (TID) | SUBCUTANEOUS | Status: DC
  Administered 2015-09-09 – 2015-09-10 (×2): 2 [IU] via SUBCUTANEOUS
  Administered 2015-09-10 (×2): 3 [IU] via SUBCUTANEOUS
  Administered 2015-09-11 (×2): 2 [IU] via SUBCUTANEOUS
  Administered 2015-09-12 (×2): 3 [IU] via SUBCUTANEOUS
  Administered 2015-09-12: 2 [IU] via SUBCUTANEOUS
  Administered 2015-09-13 (×2): 3 [IU] via SUBCUTANEOUS
  Administered 2015-09-14: 8 [IU] via SUBCUTANEOUS
  Administered 2015-09-14: 5 [IU] via SUBCUTANEOUS
  Administered 2015-09-14: 8 [IU] via SUBCUTANEOUS
  Administered 2015-09-15: 5 [IU] via SUBCUTANEOUS
  Administered 2015-09-15: 3 [IU] via SUBCUTANEOUS
  Administered 2015-09-15: 5 [IU] via SUBCUTANEOUS
  Administered 2015-09-16: 3 [IU] via SUBCUTANEOUS
  Administered 2015-09-16 (×2): 2 [IU] via SUBCUTANEOUS
  Administered 2015-09-17: 5 [IU] via SUBCUTANEOUS
  Administered 2015-09-17 (×2): 3 [IU] via SUBCUTANEOUS
  Administered 2015-09-18: 5 [IU] via SUBCUTANEOUS
  Administered 2015-09-18 (×2): 3 [IU] via SUBCUTANEOUS
  Administered 2015-09-19: 2 [IU] via SUBCUTANEOUS

## 2015-09-09 NOTE — Progress Notes (Signed)
Pharmacy Antibiotic Note  [name] is a 82 y.o. male admitted on 09/09/2015 with cellulitis.  Pharmacy has been consulted for vancomycin dosing. Pt is afebrile and WBC is low at 3.2. Lactic acid is 1.52 and Scr is elevated above baseline at 1.9.   Plan: - Vancomycin 1500mg  IV x 1 then 750mg  IV Q24H - F/u renal fxn, C&S, clinical status and trough at SS  Height: 5\' 10"  (177.8 cm) Weight: 173 lb (78.5 kg) IBW/kg (Calculated) : 73  Temp (24hrs), Avg:98.2 F (36.8 C), Min:97.9 F (36.6 C), Max:98.5 F (36.9 C)   Recent Labs Lab 09/09/15 1139 09/09/15 1204  WBC 3.2*  --   CREATININE 1.90*  --   LATICACIDVEN  --  1.52    Estimated Creatinine Clearance: 26.1 mL/min (by C-G formula based on SCr of 1.9 mg/dL).    Allergies  Allergen Reactions  . Penicillins Other (See Comments)    08/15/2015:  Tolerated Cefepime Does not remember rxn (~1950) Has patient had a PCN reaction causing immediate rash, facial/tongue/throat swelling, SOB or lightheadedness with hypotension:YES Has patient had a PCN reaction causing severe rash involving mucus membranes or skin necrosis: NO Has patient had a PCN reaction that required hospitalization NO Has patient had a PCN reaction occurring within the last 10 years: NO If all of the above answers are "NO", then may proceed with Cephalosporin use.  [name] Kitchen Neomycin-Bacitracin Zn-Polymyx Rash and Other (See Comments)    ? Reaction (thinks he remembers redness)    Antimicrobials this admission: Vanc 9/1>>  Dose adjustments this admission: N/A  Microbiology results: Pending  Thank you for allowing pharmacy to be a part of this patient's care.  Petrice Beedy, Rande Lawman 09/09/2015 12:40 PM

## 2015-09-09 NOTE — ED Triage Notes (Addendum)
Pt in from home with c/o mostly L lower leg/ankle pain, warmth, swelling. Hx of freq cellulitis, finishing oral abx now for L lower leg wound, last dose tomorrow. Pt took 5mg  Oxycodone pta. Recently had long car ride to beach. Pt was seen at [place] last night, foot xray and [place] negative results per family. A&ox4, afebrile

## 2015-09-09 NOTE — Assessment & Plan Note (Signed)
Continues to experience symptoms of cellulitis of left lower extremity with current antibiotic treatment. Symptoms appear to be worsening despite antibiotic treatment. Given pancytopenia, there is significant concern for rapid worsening of symptoms. Recommend follow-up in the emergency department for possible IV antibiotics answers no trauma or injury that would've resulted in a sprain/strain. No evidence of DVT on previous workup in the emergency department.

## 2015-09-09 NOTE — Progress Notes (Signed)
Subjective:    Patient ID: [name], male    DOB: [birth year], 82 y.o.   MRN: [id number]  Chief Complaint  Patient presents with  . Ankle Pain    left ankle pain, swelling    HPI:  [name] is a 82 y.o. male who  has a past medical history of Action tremor; Anxiety; Aortic stenosis; Bacteremia; Bradycardia; Cellulitis of left leg (10/11-16/2012); Chronic diastolic CHF (congestive heart failure) ([place]); CKD (chronic kidney disease), stage III; Colon polyp; Degenerative disc disease; Diabetes mellitus; Diverticulosis; GERD (gastroesophageal reflux disease); Gout; H/O hiatal hernia; High cholesterol; History of blood transfusion (01/2011; 11/2012); Hypertension; Hypotension; Hypothyroidism; Joint effusion, knee; Leukopenia; PAF (paroxysmal atrial fibrillation) ([place]); Pancytopenia ([place]); QT prolongation; S/P TAVR (transcatheter aortic valve replacement); Splenomegaly; Stroke [facility]); Synovial cyst of popliteal space; Throat cancer ([place]); and Thrombocytopenia ([place]). and presents today for an office visit.   Previously treated in the office for left lower extremity cellulitis with multiple courses of antibiotics. Was seen at [facility] with the chief complaint of left lower extremity swelling and pain. Workup included an ultrasound her left lower extremity with no evidence of DVT. He was noted to have continued pancytopenia. Believed he continues to have infection secondary to his pancytopenia. He declined admission at the time with follow-up to primary care. There is no evidence of sepsis and his vital signs remained stable. All ED records reviewed in detail.  Continues to experience associated symptom of pain located in his left leg with edema. He did mow about 3 acres on a riding mower prior to this starting. Notes that the pain that he has been having has been getting worse. Severity is enough that he cannot put weight on it. Denies fevers. Continues to take the previously prescribed  doxycycline without complications and has about 2 days remaining. Pain medication prescribed at the ED has not helped with the pain and the symptoms are continually worsening.   Allergies  Allergen Reactions  . Penicillins Other (See Comments)    08/15/2015:  Tolerated Cefepime Does not remember rxn (~1950) Has patient had a PCN reaction causing immediate rash, facial/tongue/throat swelling, SOB or lightheadedness with hypotension:YES Has patient had a PCN reaction causing severe rash involving mucus membranes or skin necrosis: NO Has patient had a PCN reaction that required hospitalization NO Has patient had a PCN reaction occurring within the last 10 years: NO If all of the above answers are "NO", then may proceed with Cephalosporin use.  [name] Kitchen Neomycin-Bacitracin Zn-Polymyx Rash and Other (See Comments)    ? Reaction (thinks he remembers redness)      No facility-administered medications prior to visit.    Outpatient Medications Prior to Visit  Medication Sig Dispense Refill  . allopurinol (ZYLOPRIM) 100 MG tablet Take 100 mg by mouth daily.    [name] Kitchen amiodarone (PACERONE) 200 MG tablet Take 1 tablet by mouth daily 90 tablet 0  . cephALEXin (KEFLEX) 500 MG capsule Take 1 capsule (500 mg total) by mouth 4 (four) times daily. 40 capsule 0  . Cholecalciferol (VITAMIN D) 1000 UNITS capsule Take 1 capsule (1,000 Units total) by mouth daily. 90 capsule 3  . clonazePAM (KLONOPIN) 0.5 MG tablet Take 0.25 mg by mouth daily as needed (Take 1/2 tablet once daily as needed if you wake up early).    . colchicine 0.6 MG tablet Take 0.6 mg by mouth daily as needed (gout flares).     [name] Kitchen doxycycline (MONODOX) 100 MG capsule Take 1 capsule (  100 mg total) by mouth 2 (two) times daily. 28 capsule 0  . ferrous sulfate 325 (65 FE) MG tablet Take 325 mg by mouth 2 (two) times daily with a meal.    . fish oil-omega-3 fatty acids 1000 MG capsule Take 1 g by mouth daily.      . Flaxseed, Linseed, (FLAX SEED OIL) 1000 MG  CAPS Take 1 capsule by mouth daily.     . furosemide (LASIX) 80 MG tablet Take 80mg  in the morning and 40mg  in the afternoon 60 tablet 6  . levothyroxine (SYNTHROID, LEVOTHROID) 25 MCG tablet Take 25 mcg by mouth daily before breakfast.    . metolazone (ZAROXOLYN) 2.5 MG tablet Take 1 tablet (2.5 mg total) by mouth daily as needed (edema). 30 tablet 3  . Multiple Vitamin (MULTIVITAMIN) tablet Take 1 tablet by mouth daily.      [name] Kitchen omeprazole (PRILOSEC) 20 MG capsule TAKE 1 CAPSULE EVERY DAY  (REPLACES  PANTOPRAZOLE) 90 capsule 3  . potassium chloride (K-DUR) 10 MEQ tablet Take 1 tablet (10 mEq total) by mouth daily. 90 tablet 1  . propranolol (INDERAL) 10 MG tablet Take 1 tablet (10 mg total) by mouth 2 (two) times daily. 180 tablet 3  . sertraline (ZOLOFT) 50 MG tablet TAKE 1 TABLET (50 MG TOTAL) BY MOUTH AT BEDTIME. 90 tablet 0  . traMADol (ULTRAM) 50 MG tablet Take 50 mg by mouth every 8 (eight) hours as needed (pain). Reported on 06/28/2015    . vitamin C (ASCORBIC ACID) 500 MG tablet Take 1 tablet (500 mg total) by mouth daily. 90 tablet 3      Past Surgical History:  Procedure Laterality Date  . AORTIC VALVE REPLACEMENT  01/23/2011   via minimally invasive approach per Dr Evelina Dun, [facility]  . CARDIAC CATHETERIZATION    . CARDIAC VALVE REPLACEMENT    . CHOLECYSTECTOMY    . EXCISIONAL HEMORRHOIDECTOMY    . INGUINAL HERNIA REPAIR Right   . joint effusion     left knee  . LESION REMOVAL Right 08/02/2015   Procedure: EXCISION  RIGHT EAR SKIN CANCER;  Surgeon: Rozetta Nunnery, MD;  Location: [place];  Service: ENT;  Laterality: Right;  LOCAL  . 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 SURGERY SCROTAL / TESTICULAR     "removed one" (01/05/2013)  . TEE WITHOUT CARDIOVERSION N/A 12/15/2012   Procedure: TRANSESOPHAGEAL  ECHOCARDIOGRAM (TEE);  Surgeon: [name], MD;  Location: [place];  Service: Cardiovascular;  Laterality: N/A;  . TEE WITHOUT CARDIOVERSION N/A 03/26/2014   Procedure: TRANSESOPHAGEAL ECHOCARDIOGRAM (TEE);  Surgeon: Josue Hector, MD;  Location: [place];  Service: Cardiovascular;  Laterality: N/A;  . TEE WITHOUT CARDIOVERSION N/A 05/14/2014   Procedure: TRANSESOPHAGEAL ECHOCARDIOGRAM (TEE);  Surgeon: [name], MD;  Location: [place];  Service: Open Heart Surgery;  Laterality: N/A;  . TRANSCATHETER AORTIC VALVE REPLACEMENT, TRANSFEMORAL Right 05/14/2014   Procedure: TRANSCATHETER AORTIC VALVE REPLACEMENT, TRANSFEMORAL;  Surgeon: [name], MD;  Location: [place];  Service: Open Heart Surgery;  Laterality: Right;      Past Medical History:  Diagnosis Date  . Action tremor   . Anxiety   . Aortic stenosis    a. Previously severe -> s/p minimally invasive tissue AVR with Dr. Evelina Dun at [facility] 01/2011 (pre-AVR cath with no obs  CAD);  b. 05/2014 s/p TAVR (23 mm Edwards Sapien 3).  . Bacteremia    a. 12/2012 - S bovis;  b. 12/2012 TEE w/o veg;  c. Seeing ID->Rocephin therapy extended to 01/25/2013 via PICC for possible endocarditis (No veg on TEE).  . Bradycardia   . Cellulitis of left leg 10/11-16/2012  . Chronic diastolic CHF (congestive heart failure) ([place])    a. 12/15/2012 TEE: EF 60-65%, no veg.  . CKD (chronic kidney disease), stage III   . Colon polyp   . Degenerative disc disease   . Diabetes mellitus    "borderline" (01/05/2013)  . Diverticulosis   . GERD (gastroesophageal reflux disease)   . Gout   . H/O hiatal hernia   . High cholesterol   . History of blood transfusion 01/2011; 11/2012  . Hypertension   . Hypotension   . Hypothyroidism   . Joint effusion, knee    left knee  . Leukopenia    Chronic pancytopenia  . PAF (paroxysmal atrial fibrillation) ([facility])    a. amiodarone therapy; not felt to be a candidate for anticoagulation with pancytopenia. b. Back in  AF 09/2014 - decision was made to pursue rate control only.  . Pancytopenia ([place])    a. possible chronic lymphoproliferative disorder or splenic lymphoma.  . QT prolongation   . S/P TAVR (transcatheter aortic valve replacement)    a. 05/14/2014 TAVR: 23 mm Edwards Sapien 3 transcatheter heart valve placed valve-in-valve for prosthetic valve dysfunction via open right transfemoral approach  . Splenomegaly   . Stroke ([place])   . Synovial cyst of popliteal space   . Throat cancer ([place])    s/p lasered  . Thrombocytopenia ([place])    Dr. Benay Spice      Review of Systems  Constitutional: Negative for chills and fever.  Musculoskeletal:       Positive for left sided ankle swelling and redness.  Neurological: Negative for weakness.      Objective:    BP 100/62 (BP Location: Right Arm, Patient Position: Sitting, Cuff Size: Normal)   Pulse 93   Temp 98.5 F (36.9 C) (Oral)   Resp 16   Ht 5\' 10"  (1.778 m)   SpO2 96%  Nursing note and vital signs reviewed.  Physical Exam  Constitutional: He is oriented to person, place, and time. He appears well-developed and well-nourished. No distress.  Cardiovascular: Normal rate, regular rhythm, normal heart sounds and intact distal pulses.   Pulmonary/Chest: Effort normal and breath sounds normal.  Musculoskeletal:  Left lower extremity -  Moderate edema with redness and no obvious deformity of the left ankle. There is significant increased temperature. ROM is limited secondary to discomfort. Pulses are 1+. Liagamentous testing deferred.  Neurological: He is alert and oriented to person, place, and time.  Skin: Skin is warm and dry.  Psychiatric: He has a normal mood and affect. His behavior is normal. Judgment and thought content normal.       Assessment & Plan:   Problem List Items Addressed This Visit      Other   Cellulitis of left lower extremity    Continues to experience symptoms of cellulitis of left lower extremity with current  antibiotic treatment. Symptoms appear to be worsening despite antibiotic treatment. Given pancytopenia, there is significant concern for rapid worsening of symptoms. Recommend follow-up in the emergency department for possible IV antibiotics answers no trauma or injury that would've resulted in a sprain/strain. No evidence of DVT on previous workup in the emergency  department.       Other Visit Diagnoses   None.      I am having Mr. Tanksley maintain his fish oil-omega-3 fatty acids, Flax Seed Oil, multivitamin, vitamin C, Vitamin D, colchicine, potassium chloride, ferrous sulfate, levothyroxine, traMADol, metolazone, amiodarone, sertraline, allopurinol, clonazePAM, furosemide, omeprazole, propranolol, cephALEXin, and doxycycline.   Follow-up: Return if symptoms worsen or fail to improve.  Mauricio Po, FNP

## 2015-09-09 NOTE — Patient Instructions (Signed)
Thank you for choosing [facility].  SUMMARY AND INSTRUCTIONS:  I am recommending that you go to the Emergency Room because of the cellulitis and decreased white blood cells.   Follow up:  If your symptoms worsen or fail to improve, please contact our office for further instruction, or in case of emergency go directly to the emergency room at the closest medical facility.    Cellulitis Cellulitis is an infection of the skin and the tissue beneath it. The infected area is usually red and tender. Cellulitis occurs most often in the arms and lower legs.  CAUSES  Cellulitis is caused by bacteria that enter the skin through cracks or cuts in the skin. The most common types of bacteria that cause cellulitis are staphylococci and streptococci. SIGNS AND SYMPTOMS   Redness and warmth.  Swelling.  Tenderness or pain.  Fever. DIAGNOSIS  Your health care provider can usually determine what is wrong based on a physical exam. Blood tests may also be done. TREATMENT  Treatment usually involves taking an antibiotic medicine. HOME CARE INSTRUCTIONS   Take your antibiotic medicine as directed by your health care provider. Finish the antibiotic even if you start to feel better.  Keep the infected arm or leg elevated to reduce swelling.  Apply a warm cloth to the affected area up to 4 times per day to relieve pain.  Take medicines only as directed by your health care provider.  Keep all follow-up visits as directed by your health care provider. SEEK MEDICAL CARE IF:   You notice red streaks coming from the infected area.  Your red area gets larger or turns dark in color.  Your bone or joint underneath the infected area becomes painful after the skin has healed.  Your infection returns in the same area or another area.  You notice a swollen bump in the infected area.  You develop new symptoms.  You have a fever. SEEK IMMEDIATE MEDICAL CARE IF:   You feel very sleepy.  You  develop vomiting or diarrhea.  You have a general ill feeling (malaise) with muscle aches and pains.   This information is not intended to replace advice given to you by your health care provider. Make sure you discuss any questions you have with your health care provider.   Document Released: 10/04/2004 Document Revised: 09/15/2014 Document Reviewed: 03/12/2011 Elsevier Interactive Patient Education [employer].

## 2015-09-09 NOTE — ED Provider Notes (Signed)
[facility] DEPT Provider Note   CSN: [id number] Arrival date & time: 09/09/15  1032     History   Chief Complaint Chief Complaint  Patient presents with  . Leg Pain    poss Cellulitis  . Leg Swelling    HPI [name] is a 82 y.o. male.  82yo M w/ PMH including AS s/p TAVR, dCHF, atrial fibrillation, CK D, gout, chronic pancytopenia who presents with left leg swelling and pain. The patient has a history of recurrent left leg cellulitis. He was hospitalized earlier this month for sepsis related to the cellulitis. His symptoms were controlled until a few days ago when he mowed his lawn. Afterwards he began noticing more pain in his left lower leg. He went on a car ride to the beach and yesterday went to an outside hospital ER as his pain had become severe. There, he had an ultrasound that was negative for DVT and x-ray which was unremarkable. Based on his labs showing pancytopenia, they recommended admission for IV antibiotics as he is currently on doxycycline orally, but the patient wanted to get back home for any further treatment. He was sent here by PCP for further evaluation. They have noted warmth and swelling of his lower leg associated with pain. No fevers, vomiting, or abdominal pain. He took 5 mg oxycodone prior to arrival.   The history is provided by the patient.  Leg Pain      Past Medical History:  Diagnosis Date  . Action tremor   . Anxiety   . Aortic stenosis    a. Previously severe -> s/p minimally invasive tissue AVR with Dr. Evelina Dun at [facility] 01/2011 (pre-AVR cath with no obs CAD);  b. 05/2014 s/p TAVR (23 mm Edwards Sapien 3).  . Bacteremia    a. 12/2012 - S bovis;  b. 12/2012 TEE w/o veg;  c. Seeing ID->Rocephin therapy extended to 01/25/2013 via PICC for possible endocarditis (No veg on TEE).  . Bradycardia   . Cellulitis of left leg 10/11-16/2012  . Chronic diastolic CHF (congestive heart failure) ([place])    a. 12/15/2012 TEE: EF 60-65%, no veg.  . CKD  (chronic kidney disease), stage III   . Colon polyp   . Degenerative disc disease   . Diabetes mellitus    "borderline" (01/05/2013)  . Diverticulosis   . GERD (gastroesophageal reflux disease)   . Gout   . H/O hiatal hernia   . High cholesterol   . History of blood transfusion 01/2011; 11/2012  . Hypertension   . Hypotension   . Hypothyroidism   . Joint effusion, knee    left knee  . Leukopenia    Chronic pancytopenia  . PAF (paroxysmal atrial fibrillation) (HCC)    a. amiodarone therapy; not felt to be a candidate for anticoagulation with pancytopenia. b. Back in AF 09/2014 - decision was made to pursue rate control only.  . Pancytopenia ([place])    a. possible chronic lymphoproliferative disorder or splenic lymphoma.  . QT prolongation   . S/P TAVR (transcatheter aortic valve replacement)    a. 05/14/2014 TAVR: 23 mm Edwards Sapien 3 transcatheter heart valve placed valve-in-valve for prosthetic valve dysfunction via open right transfemoral approach  . Splenomegaly   . Stroke ([place])   . Synovial cyst of popliteal space   . Throat cancer ([place])    s/p lasered  . Thrombocytopenia ([place])    Dr. Benay Spice    Patient Active Problem List   Diagnosis Date Noted  .  Wound, open, leg 08/27/2015  . Lymphoproliferative disorder ([place])   . Cellulitis of left lower extremity 07/19/2015  . Sepsis due to Escherichia coli ([place])   . UTI (lower urinary tract infection) 05/18/2015  . Hypotension 05/18/2015  . Acute on chronic diastolic CHF (congestive heart failure) ([place]) 05/18/2015  . Sepsis ([place]) 05/09/2015  . Hypervolemia 05/09/2015  . Cellulitis of left upper extremity 03/11/2015  . QT prolongation   . Generalized weakness 10/04/2014  . Chest discomfort 10/04/2014  . GERD (gastroesophageal reflux disease) 10/04/2014  . Chronic diastolic congestive heart failure ([place]) 10/04/2014  . Impingement syndrome of right shoulder region 05/21/2014  . Severe aortic stenosis 05/14/2014  . S/P TAVR  (transcatheter aortic valve replacement) 05/14/2014  . Severe aortic insufficiency 04/05/2014  . Depression 10/02/2013  . Cellulitis of leg, left 08/01/2013  . Aortic atherosclerosis ([place]) 01/06/2013  . Paroxysmal atrial fibrillation ([place]) 01/06/2013  . Lower extremity edema 01/06/2013  . CKD (chronic kidney disease), stage III 01/06/2013  . Bacteremia 12/30/2012  . S/P AVR 12/13/2012  . Protein-calorie malnutrition, severe ([place]) 12/13/2012  . Sinus bradycardia 10/04/2011  . Hypothyroidism 02/27/2011  . Restless legs 02/27/2011  . Other pancytopenia ([place]) 12/06/2009  . HYPERURICEMIA, ASYMPTOMATIC 07/25/2009  . HYPERGLYCEMIA, FASTING 01/05/2009  . CAD, NATIVE VESSEL 12/21/2008  . Type 2 diabetes mellitus with complication ([place]) [id number]  . Aortic valve disorder 05/15/2008  . ACTION TREMOR 01/14/2007  . POPLITEAL CYST, RIGHT 11/05/2006  . Gout 05/13/2006  . Thrombocytopenia ([place]) 05/13/2006  . ANXIETY 05/13/2006  . Essential hypertension 05/13/2006  . CVA 05/13/2006  . [place] DISEASE 05/13/2006  . Splenomegaly 05/13/2006    Past Surgical History:  Procedure Laterality Date  . AORTIC VALVE REPLACEMENT  01/23/2011   via minimally invasive approach per Dr Evelina Dun, [facility]  . CARDIAC CATHETERIZATION    . CARDIAC VALVE REPLACEMENT    . CHOLECYSTECTOMY    . EXCISIONAL HEMORRHOIDECTOMY    . INGUINAL HERNIA REPAIR Right   . joint effusion     left knee  . LESION REMOVAL Right 08/02/2015   Procedure: EXCISION  RIGHT EAR SKIN CANCER;  Surgeon: Rozetta Nunnery, MD;  Location: [facility];  Service: ENT;  Laterality: Right;  LOCAL  . 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 SURGERY SCROTAL / TESTICULAR     "removed one" (01/05/2013)  . TEE WITHOUT CARDIOVERSION N/A 12/15/2012   Procedure: TRANSESOPHAGEAL  ECHOCARDIOGRAM (TEE);  Surgeon: [name], MD;  Location: [facility];  Service: Cardiovascular;  Laterality: N/A;  . TEE WITHOUT CARDIOVERSION N/A 03/26/2014   Procedure: TRANSESOPHAGEAL ECHOCARDIOGRAM (TEE);  Surgeon: Josue Hector, MD;  Location: [facility];  Service: Cardiovascular;  Laterality: N/A;  . TEE WITHOUT CARDIOVERSION N/A 05/14/2014   Procedure: TRANSESOPHAGEAL ECHOCARDIOGRAM (TEE);  Surgeon: [name], MD;  Location: [facility];  Service: Open Heart Surgery;  Laterality: N/A;  . TRANSCATHETER AORTIC VALVE REPLACEMENT, TRANSFEMORAL Right 05/14/2014   Procedure: TRANSCATHETER AORTIC VALVE REPLACEMENT, TRANSFEMORAL;  Surgeon: [name], MD;  Location: [facility];  Service: Open Heart Surgery;  Laterality: Right;       Home Medications    Prior to Admission medications   Medication Sig Start Date End Date Taking? Authorizing Provider  allopurinol (ZYLOPRIM) 100 MG tablet Take 100 mg by mouth daily.   Yes Historical Provider, MD  amiodarone (PACERONE) 200 MG tablet Take 1 tablet by mouth daily 03/23/15  Yes [name], MD  Cholecalciferol (VITAMIN D) 1000 UNITS capsule Take 1 capsule (1,000 Units total) by mouth daily. 01/05/11  Yes Renella Cunas, MD  clonazePAM (KLONOPIN) 0.5 MG tablet Take 0.25 mg by mouth daily as needed (Take 1/2 tablet once daily as needed if you wake up early).   Yes Historical Provider, MD  colchicine 0.6 MG tablet Take 0.6 mg by mouth daily as needed (gout flares).  04/03/11  Yes Renella Cunas, MD  doxycycline (MONODOX) 100 MG capsule Take 1 capsule (100 mg total) by mouth 2 (two) times daily. 08/29/15  Yes Binnie Rail, MD  ferrous sulfate 325 (65 FE) MG tablet Take 325 mg by mouth 2 (two) times daily with a meal.   Yes Historical Provider, MD  fish oil-omega-3 fatty acids 1000 MG capsule Take 1 g by mouth daily.     Yes Historical Provider, MD  Flaxseed, Linseed, (FLAX SEED OIL) 1000 MG CAPS Take 1 capsule by mouth daily.    Yes Historical  Provider, MD  furosemide (LASIX) 80 MG tablet Take 80mg  in the morning and 40mg  in the afternoon 06/10/15  Yes [name], MD  levothyroxine (SYNTHROID, LEVOTHROID) 25 MCG tablet Take 25 mcg by mouth daily before breakfast.   Yes Historical Provider, MD  metolazone (ZAROXOLYN) 2.5 MG tablet Take 1 tablet (2.5 mg total) by mouth daily as needed (edema). 12/22/14  Yes [name], MD  Multiple Vitamin (MULTIVITAMIN) tablet Take 1 tablet by mouth daily.     Yes Historical Provider, MD  omeprazole (PRILOSEC) 20 MG capsule TAKE 1 CAPSULE EVERY DAY  (REPLACES  PANTOPRAZOLE) 06/29/15  Yes [name], MD  potassium chloride (K-DUR) 10 MEQ tablet Take 1 tablet (10 mEq total) by mouth daily. 02/04/14  Yes [name], MD  propranolol (INDERAL) 10 MG tablet Take 1 tablet (10 mg total) by mouth 2 (two) times daily. 08/19/15  Yes Cherene Altes, MD  sertraline (ZOLOFT) 50 MG tablet TAKE 1 TABLET (50 MG TOTAL) BY MOUTH AT BEDTIME. 03/29/15  Yes Binnie Rail, MD  traMADol (ULTRAM) 50 MG tablet Take 50 mg by mouth every 8 (eight) hours as needed (pain). Reported on 06/28/2015   Yes Historical Provider, MD  vitamin C (ASCORBIC ACID) 500 MG tablet Take 1 tablet (500 mg total) by mouth daily. 01/05/11  Yes Renella Cunas, MD  cephALEXin (KEFLEX) 500 MG capsule Take 1 capsule (500 mg total) by mouth 4 (four) times daily. Patient not taking: Reported on 09/09/2015 08/27/15   Ann Held, DO    Family History Family History  Problem Relation Age of Onset  . Lung cancer Mother   . Heart disease Father   . Colon cancer Other   . Stroke Other   . Cancer Brother     Social History Social History  Substance Use Topics  . Smoking status: Former Smoker    Packs/day: 0.75    Years: 24.00    Types: Cigarettes    Quit date: 01/11/1962  . Smokeless tobacco: Never Used  . Alcohol use No     Allergies   Penicillins and Neomycin-bacitracin zn-polymyx   Review of Systems Review of Systems 10  Systems reviewed and are negative for acute change except as noted in the HPI.   Physical Exam Updated Vital Signs BP 105/62   Pulse 108   Temp 97.9 F (36.6 C) (Oral)   Resp  16   Ht 5\' 10"  (1.778 m)   Wt 173 lb (78.5 kg)   SpO2 99%   BMI 24.82 kg/m   Physical Exam  Constitutional: He is oriented to person, place, and time. He appears well-developed and well-nourished. No distress.  HENT:  Head: Normocephalic and atraumatic.  Moist mucous membranes  Eyes: Conjunctivae are normal. Pupils are equal, round, and reactive to light.  Neck: Neck supple.  Cardiovascular: Normal rate, regular rhythm and normal heart sounds.   No murmur heard. Pulmonary/Chest: Effort normal and breath sounds normal.  Abdominal: Soft. Bowel sounds are normal. He exhibits no distension. There is no tenderness.  Musculoskeletal: He exhibits edema.  2+ pitting edema BLE L>R; erythema, warmth, and increased swelling of distal L lower leg extending through ankle and to dorsal foot; 2+ DP pulses  Neurological: He is alert and oriented to person, place, and time.  Fluent speech  Skin: Skin is warm and dry.  Chronic venous stasis skin changes on b/l shins; erythema and warmth of anterior L lower leg extending to dorsal foot, no crepitus  Psychiatric: He has a normal mood and affect. Judgment normal.  Nursing note and vitals reviewed.    ED Treatments / Results  Labs (all labs ordered are listed, but only abnormal results are displayed) Labs Reviewed  BASIC METABOLIC PANEL - Abnormal; Notable for the following:       Result Value   CO2 21 (*)    Glucose, Bld 159 (*)    BUN 64 (*)    Creatinine, Ser 1.90 (*)    GFR calc non Af Amer 29 (*)    GFR calc Af Amer 34 (*)    All other components within normal limits  CBC WITH DIFFERENTIAL/PLATELET - Abnormal; Notable for the following:    WBC 3.2 (*)    RBC 3.57 (*)    Hemoglobin 10.4 (*)    HCT 31.2 (*)    RDW 17.2 (*)    Platelets 31 (*)    Lymphs Abs  0.5 (*)    All other components within normal limits  C-REACTIVE PROTEIN - Abnormal; Notable for the following:    CRP 1.9 (*)    All other components within normal limits  URINE CULTURE  CULTURE, BLOOD (ROUTINE X 2)  CULTURE, BLOOD (ROUTINE X 2)  SEDIMENTATION RATE  I-STAT CG4 LACTIC ACID, ED    EKG  EKG Interpretation None       Radiology No results found.  Procedures Procedures (including critical care time)  Medications Ordered in ED Medications  vancomycin (VANCOCIN) 1,500 mg in sodium chloride 0.9 % 500 mL IVPB (1,500 mg Intravenous New Bag/Given 09/09/15 1214)  vancomycin (VANCOCIN) IVPB 750 mg/150 ml premix (not administered)  sodium chloride 0.9 % bolus 500 mL (not administered)     Initial Impression / Assessment and Plan / ED Course  I have reviewed the triage vital signs and the nursing notes.  Pertinent labs & imaging results that were available during my care of the patient were reviewed by me and considered in my medical decision making (see chart for details).  Clinical Course    Pt currently on doxycycline for LE cellulitis Presents with worsening redness, swelling, and pain of his left lower leg despite medication compliance. He was nontoxic with reassuring vital signs at presentation. The erythema, warmth, and swelling extended onto his dorsal foot and to his distal left lower leg. It does not appear to be isolated to the ankle joint. I reviewed  outside hospital workup from yesterday which shows plain film negative for gas or osteomyelitis and ultrasound negative for DVT. I reviewed his chart which shows hospitalization at the beginning of the month for sepsis related to cellulitis. Labs today show acute on chronic kidney injury with creatinine 1.9, BUN 64. CBC shows persistent for pancytopenia. Blood and urine culture sent. Lactate normal and given patient is afebrile I doubt sepsis. I have given a dose of vancomycin and small fluid bolus. Discussed with Dr.  Maylene Roes, hospitalist, and pt admitted for further treatment.  Final Clinical Impressions(s) / ED Diagnoses   Final diagnoses:  Left leg cellulitis    New Prescriptions New Prescriptions   No medications on file     Sharlett Iles, MD 09/09/15 1551

## 2015-09-09 NOTE — H&P (Addendum)
History and Physical    [name] [id number] DOB: 02/28/1922 DOA: 09/09/2015  PCP: Binnie Rail, MD Patient coming from: Home  Chief Complaint: left lower leg swelling and pain   HPI: [name] is a 82 y.o. male with medical history significant of AS s/p TAVR 2016, dCHF, lymphoproliferative disorder with severe pancytopenia followed by Dr. Benay Spice and PAF not on anticoagulation. He has had recurrent left lower leg cellulitis in the past; most recently admitted for septic shock due to cellulitis. He also has history of pancytopenia and on chart review, it appears that his recurrent cellulitis may be attributed to his low cell counts. At time of discharge early August 2017, he was discharged on keflex. Since discharge, he had another episode of LLE cellulitis and was placed on doxycycline outpatient. His last dose is in 2 days. This episode of LLE swelling started yesterday, without any trauma. He was seen at [facility] last night and work up was negative including negative [place] for DVT. He was seen at [facility] office this morning for follow up. Due to his chronic pancytopenia and previous history of septic shock, they recommended hospital evaluation for his recurrent left lower leg cellulitis and IV abx therapy. He admits to LLE swelling and pain in his skin but not within the joint. His pain is worse with movement and pressure. He also admits to nausea without vomiting. He denies any fevers, chills, chest pain, shortness of breath, cough, diarrhea, abdominal pain. He also denies any spontaneous bleeding such as epistaxis or gum bleeding. No hematochezia.   ED Course: Blood cultures obtained. No further imaging completed due to previous negative work up at outside facility on 8/31 (see Care Everywhere). Vancomycin started.   Review of Systems: As per HPI otherwise 10 point review of systems negative.    Past Medical History:  Diagnosis Date  . Action tremor   . Anxiety   . Aortic  stenosis    a. Previously severe -> s/p minimally invasive tissue AVR with Dr. Evelina Dun at [facility] 01/2011 (pre-AVR cath with no obs CAD);  b. 05/2014 s/p TAVR (23 mm Edwards Sapien 3).  . Bacteremia    a. 12/2012 - S bovis;  b. 12/2012 TEE w/o veg;  c. Seeing ID->Rocephin therapy extended to 01/25/2013 via PICC for possible endocarditis (No veg on TEE).  . Bradycardia   . Cellulitis of left leg 10/11-16/2012  . Chronic diastolic CHF (congestive heart failure) ([place])    a. 12/15/2012 TEE: EF 60-65%, no veg.  . CKD (chronic kidney disease), stage III   . Colon polyp   . Degenerative disc disease   . Diabetes mellitus    "borderline" (01/05/2013)  . Diverticulosis   . GERD (gastroesophageal reflux disease)   . Gout   . H/O hiatal hernia   . High cholesterol   . History of blood transfusion 01/2011; 11/2012  . Hypertension   . Hypotension   . Hypothyroidism   . Joint effusion, knee    left knee  . Leukopenia    Chronic pancytopenia  . PAF (paroxysmal atrial fibrillation) (HCC)    a. amiodarone therapy; not felt to be a candidate for anticoagulation with pancytopenia. b. Back in AF 09/2014 - decision was made to pursue rate control only.  . Pancytopenia ([place])    a. possible chronic lymphoproliferative disorder or splenic lymphoma.  . QT prolongation   . S/P TAVR (transcatheter aortic valve replacement)    a. 05/14/2014 TAVR: 23 mm Edwards Sapien  3 transcatheter heart valve placed valve-in-valve for prosthetic valve dysfunction via open right transfemoral approach  . Splenomegaly   . Stroke ([place])   . Synovial cyst of popliteal space   . Throat cancer ([place])    s/p lasered  . Thrombocytopenia (HCC)    Dr. Benay Spice    Past Surgical History:  Procedure Laterality Date  . AORTIC VALVE REPLACEMENT  01/23/2011   via minimally invasive approach per Dr Evelina Dun, [facility]  . CARDIAC CATHETERIZATION    . CARDIAC VALVE REPLACEMENT    . CHOLECYSTECTOMY    . EXCISIONAL HEMORRHOIDECTOMY    . INGUINAL HERNIA  REPAIR Right   . joint effusion     left knee  . LESION REMOVAL Right 08/02/2015   Procedure: EXCISION  RIGHT EAR SKIN CANCER;  Surgeon: Rozetta Nunnery, MD;  Location: [place];  Service: ENT;  Laterality: Right;  LOCAL  . 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 SURGERY SCROTAL / TESTICULAR     "removed one" (01/05/2013)  . TEE WITHOUT CARDIOVERSION N/A 12/15/2012   Procedure: TRANSESOPHAGEAL ECHOCARDIOGRAM (TEE);  Surgeon: [name], MD;  Location: [place];  Service: Cardiovascular;  Laterality: N/A;  . TEE WITHOUT CARDIOVERSION N/A 03/26/2014   Procedure: TRANSESOPHAGEAL ECHOCARDIOGRAM (TEE);  Surgeon: Josue Hector, MD;  Location: [place];  Service: Cardiovascular;  Laterality: N/A;  . TEE WITHOUT CARDIOVERSION N/A 05/14/2014   Procedure: TRANSESOPHAGEAL ECHOCARDIOGRAM (TEE);  Surgeon: [name], MD;  Location: [place];  Service: Open Heart Surgery;  Laterality: N/A;  . TRANSCATHETER AORTIC VALVE REPLACEMENT, TRANSFEMORAL Right 05/14/2014   Procedure: TRANSCATHETER AORTIC VALVE REPLACEMENT, TRANSFEMORAL;  Surgeon: [name], MD;  Location: [place];  Service: Open Heart Surgery;  Laterality: Right;     reports that he quit smoking about 53 years ago. His smoking use included Cigarettes. He has a 18.00 pack-year smoking history. He has never used smokeless tobacco. He reports that he does not drink alcohol or use drugs.  Allergies  Allergen Reactions  . Penicillins Other (See Comments)    08/15/2015:  Tolerated Cefepime Does not remember rxn (~1950) Has patient had a PCN reaction causing immediate rash, facial/tongue/throat swelling, SOB or lightheadedness with hypotension:YES Has patient had a PCN reaction causing severe rash involving mucus membranes or skin necrosis: NO Has patient had a PCN reaction  that required hospitalization NO Has patient had a PCN reaction occurring within the last 10 years: NO If all of the above answers are "NO", then may proceed with Cephalosporin use.  [name] Kitchen Neomycin-Bacitracin Zn-Polymyx Rash and Other (See Comments)    ? Reaction (thinks he remembers redness)    Family History  Problem Relation Age of Onset  . Lung cancer Mother   . Heart disease Father   . Colon cancer Other   . Stroke Other   . Cancer Brother     Prior to Admission medications   Medication Sig Start Date End Date Taking? Authorizing Provider  allopurinol (ZYLOPRIM) 100 MG tablet Take 100 mg by mouth daily.   Yes Historical Provider, MD  amiodarone (PACERONE) 200 MG tablet Take 1 tablet by mouth daily 03/23/15  Yes [name], MD  Cholecalciferol (VITAMIN D) 1000 UNITS capsule Take 1 capsule (1,000 Units total) by mouth daily. 01/05/11  Yes Renella Cunas, MD  clonazePAM (KLONOPIN) 0.5 MG  tablet Take 0.25 mg by mouth daily as needed (Take 1/2 tablet once daily as needed if you wake up early).   Yes Historical Provider, MD  colchicine 0.6 MG tablet Take 0.6 mg by mouth daily as needed (gout flares).  04/03/11  Yes Renella Cunas, MD  doxycycline (MONODOX) 100 MG capsule Take 1 capsule (100 mg total) by mouth 2 (two) times daily. 08/29/15  Yes Binnie Rail, MD  ferrous sulfate 325 (65 FE) MG tablet Take 325 mg by mouth 2 (two) times daily with a meal.   Yes Historical Provider, MD  fish oil-omega-3 fatty acids 1000 MG capsule Take 1 g by mouth daily.     Yes Historical Provider, MD  Flaxseed, Linseed, (FLAX SEED OIL) 1000 MG CAPS Take 1 capsule by mouth daily.    Yes Historical Provider, MD  furosemide (LASIX) 80 MG tablet Take 80mg  in the morning and 40mg  in the afternoon 06/10/15  Yes [name], MD  levothyroxine (SYNTHROID, LEVOTHROID) 25 MCG tablet Take 25 mcg by mouth daily before breakfast.   Yes Historical Provider, MD  metolazone (ZAROXOLYN) 2.5 MG tablet Take 1 tablet (2.5 mg  total) by mouth daily as needed (edema). 12/22/14  Yes [name], MD  Multiple Vitamin (MULTIVITAMIN) tablet Take 1 tablet by mouth daily.     Yes Historical Provider, MD  omeprazole (PRILOSEC) 20 MG capsule TAKE 1 CAPSULE EVERY DAY  (REPLACES  PANTOPRAZOLE) 06/29/15  Yes [name], MD  potassium chloride (K-DUR) 10 MEQ tablet Take 1 tablet (10 mEq total) by mouth daily. 02/04/14  Yes [name], MD  propranolol (INDERAL) 10 MG tablet Take 1 tablet (10 mg total) by mouth 2 (two) times daily. 08/19/15  Yes Cherene Altes, MD  sertraline (ZOLOFT) 50 MG tablet TAKE 1 TABLET (50 MG TOTAL) BY MOUTH AT BEDTIME. 03/29/15  Yes Binnie Rail, MD  traMADol (ULTRAM) 50 MG tablet Take 50 mg by mouth every 8 (eight) hours as needed (pain). Reported on 06/28/2015   Yes Historical Provider, MD  vitamin C (ASCORBIC ACID) 500 MG tablet Take 1 tablet (500 mg total) by mouth daily. 01/05/11  Yes Renella Cunas, MD  cephALEXin (KEFLEX) 500 MG capsule Take 1 capsule (500 mg total) by mouth 4 (four) times daily. Patient not taking: Reported on 09/09/2015 08/27/15   Ann Held, DO    Physical Exam: Vitals:   09/09/15 1330 09/09/15 1345 09/09/15 1359 09/09/15 1400  BP: 105/64 108/65 108/65 114/62  Pulse: 78 71 78 76  Resp:   16   Temp:      TempSrc:      SpO2: 99% 99% 100% 100%  Weight:      Height:          Constitutional: NAD, calm, comfortable  Vitals:   09/09/15 1330 09/09/15 1345 09/09/15 1359 09/09/15 1400  BP: 105/64 108/65 108/65 114/62  Pulse: 78 71 78 76  Resp:   16   Temp:      TempSrc:      SpO2: 99% 99% 100% 100%  Weight:      Height:       Eyes: PERRL, lids and conjunctivae normal ENMT: Mucous membranes are moist. Posterior pharynx clear of any exudate or lesions.Normal dentition.  Neck: normal, supple, no masses, no thyromegaly Respiratory: clear to auscultation bilaterally, no wheezing, no crackles. Normal respiratory effort. No accessory muscle use.    Cardiovascular: Regular rate and rhythm, no murmurs / rubs / gallops. +1  bilateral lower extremity edema, worse on left. 1+ pedal pulses bilaterally  Abdomen: no tenderness, no masses palpated. No hepatosplenomegaly. Bowel sounds positive. Distended but soft  Musculoskeletal: no clubbing / cyanosis. No joint deformity upper and lower extremities. Good ROM, no contractures. Normal muscle tone.  Skin: +all limbs with skin thinning and chronic bruising, left lower leg with edema, erythema extending from midfoot to knee, and warmth worse than right  Neurologic: CN 2-12 grossly intact. Sensation intact, DTR normal. Strength 5/5 in all 4.  Psychiatric: Normal judgment and insight. Alert and oriented x 3. Normal mood.   Labs on Admission: I have personally reviewed following labs and imaging studies  CBC:  Recent Labs Lab 09/09/15 1139  WBC 3.2*  NEUTROABS 2.4  HGB 10.4*  HCT 31.2*  MCV 87.4  PLT 31*   Basic Metabolic Panel:  Recent Labs Lab 09/09/15 1139  NA 135  K 4.1  CL 104  CO2 21*  GLUCOSE 159*  BUN 64*  CREATININE 1.90*  CALCIUM 9.0   GFR: Estimated Creatinine Clearance: 26.1 mL/min (by C-G formula based on SCr of 1.9 mg/dL). Liver Function Tests: No results for input(s): AST, ALT, ALKPHOS, BILITOT, PROT, ALBUMIN in the last 168 hours. No results for input(s): LIPASE, AMYLASE in the last 168 hours. No results for input(s): AMMONIA in the last 168 hours. Coagulation Profile: No results for input(s): INR, PROTIME in the last 168 hours. Cardiac Enzymes: No results for input(s): CKTOTAL, CKMB, CKMBINDEX, TROPONINI in the last 168 hours. BNP (last 3 results)  Recent Labs  05/09/15 1157  PROBNP 410.0*   HbA1C: No results for input(s): HGBA1C in the last 72 hours. CBG: No results for input(s): GLUCAP in the last 168 hours. Lipid Profile: No results for input(s): CHOL, HDL, LDLCALC, TRIG, CHOLHDL, LDLDIRECT in the last 72 hours. Thyroid Function Tests: No results  for input(s): TSH, T4TOTAL, FREET4, T3FREE, THYROIDAB in the last 72 hours. Anemia Panel: No results for input(s): VITAMINB12, FOLATE, FERRITIN, TIBC, IRON, RETICCTPCT in the last 72 hours. Urine analysis:    Component Value Date/Time   COLORURINE YELLOW 08/10/2015 2036   APPEARANCEUR CLEAR 08/10/2015 2036   LABSPEC 1.018 08/10/2015 2036   PHURINE 5.0 08/10/2015 2036   GLUCOSEU NEGATIVE 08/10/2015 2036   HGBUR NEGATIVE 08/10/2015 2036   BILIRUBINUR NEGATIVE 08/10/2015 2036   KETONESUR NEGATIVE 08/10/2015 2036   PROTEINUR NEGATIVE 08/10/2015 2036   UROBILINOGEN 0.2 05/13/2014 2314   NITRITE NEGATIVE 08/10/2015 2036   LEUKOCYTESUR NEGATIVE 08/10/2015 2036   )No results found for this or any previous visit (from the past 240 hour(s)).   Radiological Exams on Admission: No results found.  Assessment/Plan Principal Problem:   Cellulitis of leg, left Active Problems:   Type 2 diabetes mellitus with complication (HCC)   Gout   Essential hypertension   Other pancytopenia (HCC)   Hypothyroidism   Paroxysmal atrial fibrillation (HCC)   CKD (chronic kidney disease), stage III   Depression   S/P TAVR (transcatheter aortic valve replacement)   GERD (gastroesophageal reflux disease)   Chronic diastolic congestive heart failure (HCC)   Lymphoproliferative disorder (HCC)   Recurrent cellulitis of lower leg  Recurrent left lower leg cellulitis  Blood cultures pending  Vancomycin  Chronic pancytopenia, lymphoproliferative disorder  Follows with Dr. Benay Spice   Monitor CBC daily   AS s/p TAVR [id number] Chronic diastolic heart failure  Paroxysmal A Fib  Essential HTN   Not candidate for anticoag due to thrombocytopenia   Follows with Dr. Burt Knack  Amiodarone 200mg  daily   Propranolol 10mg  BID  Lasix 80mg  in Am, 40mg  in PM  Chronic gout  Allopurinol 100mg  daily   Hypothyroidism  Synthroid 41mcg daily   Depression  Zoloft 50mg  daily   Klonopin 0.25mg  daily prn for anxiety   DM  type II with complication including CKD stage 3  Not on any medication at home  SSI   CKD stage 3  Baseline Cr ranging between 1.6-1.9   Monitor BMP   DVT prophylaxis: SCDs  Code Status: Partial, no CPR, no defib, no intubation, +pressors okay  Family Communication: daughter and son-in-law in room at time of examination   Disposition Plan: pending improvement of primary condition  Consults called: None Admission status: inpatient, [place], DO [facility] Pager [phone]  If 7PM-7AM, please contact night-coverage [email or web address] Password [email or web address] 09/09/2015, 2:58 PM

## 2015-09-10 DIAGNOSIS — Z953 Presence of xenogenic heart valve: Secondary | ICD-10-CM | POA: Diagnosis not present

## 2015-09-10 DIAGNOSIS — F329 Major depressive disorder, single episode, unspecified: Secondary | ICD-10-CM | POA: Diagnosis not present

## 2015-09-10 DIAGNOSIS — I480 Paroxysmal atrial fibrillation: Secondary | ICD-10-CM | POA: Diagnosis present

## 2015-09-10 DIAGNOSIS — E1122 Type 2 diabetes mellitus with diabetic chronic kidney disease: Secondary | ICD-10-CM | POA: Diagnosis present

## 2015-09-10 DIAGNOSIS — T380X5A Adverse effect of glucocorticoids and synthetic analogues, initial encounter: Secondary | ICD-10-CM | POA: Diagnosis present

## 2015-09-10 DIAGNOSIS — I959 Hypotension, unspecified: Secondary | ICD-10-CM | POA: Diagnosis present

## 2015-09-10 DIAGNOSIS — I5032 Chronic diastolic (congestive) heart failure: Secondary | ICD-10-CM | POA: Diagnosis present

## 2015-09-10 DIAGNOSIS — F419 Anxiety disorder, unspecified: Secondary | ICD-10-CM | POA: Diagnosis present

## 2015-09-10 DIAGNOSIS — Z8673 Personal history of transient ischemic attack (TIA), and cerebral infarction without residual deficits: Secondary | ICD-10-CM | POA: Diagnosis not present

## 2015-09-10 DIAGNOSIS — I951 Orthostatic hypotension: Secondary | ICD-10-CM | POA: Diagnosis not present

## 2015-09-10 DIAGNOSIS — E872 Acidosis: Secondary | ICD-10-CM | POA: Diagnosis not present

## 2015-09-10 DIAGNOSIS — B351 Tinea unguium: Secondary | ICD-10-CM | POA: Diagnosis not present

## 2015-09-10 DIAGNOSIS — D600 Chronic acquired pure red cell aplasia: Secondary | ICD-10-CM | POA: Diagnosis not present

## 2015-09-10 DIAGNOSIS — N433 Hydrocele, unspecified: Secondary | ICD-10-CM | POA: Diagnosis present

## 2015-09-10 DIAGNOSIS — M79605 Pain in left leg: Secondary | ICD-10-CM | POA: Diagnosis present

## 2015-09-10 DIAGNOSIS — M10072 Idiopathic gout, left ankle and foot: Secondary | ICD-10-CM | POA: Diagnosis not present

## 2015-09-10 DIAGNOSIS — K219 Gastro-esophageal reflux disease without esophagitis: Secondary | ICD-10-CM | POA: Diagnosis present

## 2015-09-10 DIAGNOSIS — M79609 Pain in unspecified limb: Secondary | ICD-10-CM | POA: Diagnosis not present

## 2015-09-10 DIAGNOSIS — L03116 Cellulitis of left lower limb: Secondary | ICD-10-CM | POA: Diagnosis present

## 2015-09-10 DIAGNOSIS — K769 Liver disease, unspecified: Secondary | ICD-10-CM | POA: Diagnosis not present

## 2015-09-10 DIAGNOSIS — N432 Other hydrocele: Secondary | ICD-10-CM | POA: Diagnosis not present

## 2015-09-10 DIAGNOSIS — K4090 Unilateral inguinal hernia, without obstruction or gangrene, not specified as recurrent: Secondary | ICD-10-CM | POA: Diagnosis not present

## 2015-09-10 DIAGNOSIS — M1A9XX0 Chronic gout, unspecified, without tophus (tophi): Secondary | ICD-10-CM | POA: Diagnosis present

## 2015-09-10 DIAGNOSIS — N183 Chronic kidney disease, stage 3 (moderate): Secondary | ICD-10-CM | POA: Diagnosis present

## 2015-09-10 DIAGNOSIS — E038 Other specified hypothyroidism: Secondary | ICD-10-CM | POA: Diagnosis not present

## 2015-09-10 DIAGNOSIS — D61818 Other pancytopenia: Secondary | ICD-10-CM | POA: Diagnosis present

## 2015-09-10 DIAGNOSIS — E039 Hypothyroidism, unspecified: Secondary | ICD-10-CM | POA: Diagnosis present

## 2015-09-10 DIAGNOSIS — D479 Neoplasm of uncertain behavior of lymphoid, hematopoietic and related tissue, unspecified: Secondary | ICD-10-CM | POA: Diagnosis not present

## 2015-09-10 DIAGNOSIS — I272 Other secondary pulmonary hypertension: Secondary | ICD-10-CM | POA: Diagnosis present

## 2015-09-10 DIAGNOSIS — E875 Hyperkalemia: Secondary | ICD-10-CM | POA: Diagnosis not present

## 2015-09-10 DIAGNOSIS — D759 Disease of blood and blood-forming organs, unspecified: Secondary | ICD-10-CM | POA: Diagnosis not present

## 2015-09-10 DIAGNOSIS — Z823 Family history of stroke: Secondary | ICD-10-CM | POA: Diagnosis not present

## 2015-09-10 DIAGNOSIS — I10 Essential (primary) hypertension: Secondary | ICD-10-CM | POA: Diagnosis not present

## 2015-09-10 DIAGNOSIS — B9689 Other specified bacterial agents as the cause of diseases classified elsewhere: Secondary | ICD-10-CM | POA: Diagnosis not present

## 2015-09-10 DIAGNOSIS — I130 Hypertensive heart and chronic kidney disease with heart failure and stage 1 through stage 4 chronic kidney disease, or unspecified chronic kidney disease: Secondary | ICD-10-CM | POA: Diagnosis present

## 2015-09-10 DIAGNOSIS — E1165 Type 2 diabetes mellitus with hyperglycemia: Secondary | ICD-10-CM | POA: Diagnosis present

## 2015-09-10 DIAGNOSIS — Z85819 Personal history of malignant neoplasm of unspecified site of lip, oral cavity, and pharynx: Secondary | ICD-10-CM | POA: Diagnosis not present

## 2015-09-10 LAB — CBC WITH DIFFERENTIAL/PLATELET
Basophils Absolute: 0 10*3/uL (ref 0.0–0.1)
Basophils Relative: 0 %
EOS ABS: 0 10*3/uL (ref 0.0–0.7)
EOS PCT: 1 %
HCT: 28.8 % — ABNORMAL LOW (ref 39.0–52.0)
Hemoglobin: 9.3 g/dL — ABNORMAL LOW (ref 13.0–17.0)
LYMPHS ABS: 0.4 10*3/uL — AB (ref 0.7–4.0)
Lymphocytes Relative: 16 %
MCH: 28.5 pg (ref 26.0–34.0)
MCHC: 32.3 g/dL (ref 30.0–36.0)
MCV: 88.3 fL (ref 78.0–100.0)
MONO ABS: 0.2 10*3/uL (ref 0.1–1.0)
Monocytes Relative: 10 %
Neutro Abs: 1.7 10*3/uL (ref 1.7–7.7)
Neutrophils Relative %: 73 %
PLATELETS: 27 10*3/uL — AB (ref 150–400)
RBC: 3.26 MIL/uL — AB (ref 4.22–5.81)
RDW: 17.1 % — AB (ref 11.5–15.5)
WBC: 2.3 10*3/uL — AB (ref 4.0–10.5)

## 2015-09-10 LAB — BASIC METABOLIC PANEL
Anion gap: 10 (ref 5–15)
BUN: 62 mg/dL — AB (ref 6–20)
CHLORIDE: 106 mmol/L (ref 101–111)
CO2: 21 mmol/L — ABNORMAL LOW (ref 22–32)
CREATININE: 1.86 mg/dL — AB (ref 0.61–1.24)
Calcium: 8.8 mg/dL — ABNORMAL LOW (ref 8.9–10.3)
GFR calc Af Amer: 35 mL/min — ABNORMAL LOW (ref >60)
GFR calc non Af Amer: 30 mL/min — ABNORMAL LOW (ref >60)
Glucose, Bld: 118 mg/dL — ABNORMAL HIGH (ref 65–99)
Potassium: 4.1 mmol/L (ref 3.5–5.1)
SODIUM: 137 mmol/L (ref 135–145)

## 2015-09-10 LAB — URINE CULTURE: Special Requests: NORMAL

## 2015-09-10 LAB — GLUCOSE, CAPILLARY
GLUCOSE-CAPILLARY: 157 mg/dL — AB (ref 65–99)
Glucose-Capillary: 164 mg/dL — ABNORMAL HIGH (ref 65–99)
Glucose-Capillary: 128 mg/dL — ABNORMAL HIGH (ref 65–99)
Glucose-Capillary: 142 mg/dL — ABNORMAL HIGH (ref 65–99)

## 2015-09-10 MED ORDER — POLYETHYLENE GLYCOL 3350 17 G PO PACK
17.0000 g | PACK | Freq: Every day | ORAL | Status: DC
  Administered 2015-09-11 – 2015-09-22 (×9): 17 g via ORAL
  Filled 2015-09-10 (×14): qty 1

## 2015-09-10 MED ORDER — OXYCODONE HCL 5 MG PO TABS
5.0000 mg | ORAL_TABLET | Freq: Four times a day (QID) | ORAL | Status: DC | PRN
  Administered 2015-09-13 – 2015-09-23 (×7): 5 mg via ORAL
  Filled 2015-09-10 (×8): qty 1

## 2015-09-10 NOTE — Progress Notes (Signed)
PROGRESS NOTE    [name]  NWG:[id number] DOB: [birth year] DOA: 09/09/2015 PCP: Binnie Rail, MD     Brief Narrative:  82 y.o. WM PMHx  Severe AS--> S/P Aortic valve replacement surgery at [facility] on 01/23/2011 (porcine valve); S/P transcatheter aortic valve replacement [place] 05/14/2014 (bovine valve) , Chronic Diastolic CHF,PAF not on anticoagulation,Bradycardia,QT prolongation, CKD (chronic kidney disease), stage III, Lymphoproliferative DO,Throat cancer , Severe Pancytopenia followed by Dr. Benay Spice, Recurrent Cellulitis of left leg.   He has had recurrent left lower leg cellulitis in the past; most recently admitted for septic shock due to cellulitis. He also has history of pancytopenia and on chart review, it appears that his recurrent cellulitis may be attributed to his low cell counts. At time of discharge early August 2017, he was discharged on keflex. Since discharge, he had another episode of LLE cellulitis and was placed on doxycycline outpatient. His last dose is in 2 days. This episode of LLE swelling started yesterday, without any trauma. He was seen at [facility] last night and work up was negative including negative [place] for DVT. He was seen at [facility] office this morning for follow up. Due to his chronic pancytopenia and previous history of septic shock, they recommended hospital evaluation for his recurrent left lower leg cellulitis and IV abx therapy. He admits to LLE swelling and pain in his skin but not within the joint. His pain is worse with movement and pressure. He also admits to nausea without vomiting. He denies any fevers, chills, chest pain, shortness of breath, cough, diarrhea, abdominal pain. He also denies any spontaneous bleeding such as epistaxis or gum bleeding. No hematochezia.    Subjective: 9/2 A/O 4. Significant LLE pain greatest in foot. Initially started out secondary to laceration on left calf from cooler.   Assessment & Plan:   Principal  Problem:   Cellulitis of leg, left Active Problems:   Type 2 diabetes mellitus with complication (HCC)   Gout   Essential hypertension   Other pancytopenia (HCC)   Hypothyroidism   Paroxysmal atrial fibrillation (HCC)   CKD (chronic kidney disease), stage III   Depression   S/P TAVR (transcatheter aortic valve replacement)   GERD (gastroesophageal reflux disease)   Chronic diastolic congestive heart failure (HCC)   Lymphoproliferative disorder (HCC)   Recurrent cellulitis of lower leg    Recurrent left lower leg cellulitis -Patient with recurrent cellulitis requiring multiple rounds of antibiotics. Consult ID on 9/3 since patient has had multiple aortic valve replacements, and a lymphoproliferative disorder does he need to be on prophylactic antibiotic? -Continue vancomycin until blood cultures return.  -See pictures below .  Chronic pancytopenia, lymphoproliferative disorder - Follows with Dr. Betsy Coder  CBC Latest Ref Rng & Units 09/10/2015 09/09/2015 08/15/2015  WBC 4.0 - 10.5 K/uL 2.3(L) 3.2(L) 1.7(L)  Hemoglobin 13.0 - 17.0 g/dL 9.3(L) 10.4(L) 8.6(L)  Hematocrit 39.0 - 52.0 % 28.8(L) 31.2(L) 26.8(L)  Platelets 150 - 400 K/uL 27(LL) 31(L) 23(LL)  -No active signs of bleeding: Monitor closely  AS s/p TAVR 2013 at [facility], repair 2016 at [facility] -Monitor blood cultures closely -See recurrent cellulitis  Chronic Diastolic CHF /Hypotension  -Strict I&O since admission -1.7 L -Daily weight -Discontinue Lasix secondary to infection and patient's relative hypotension -Normal saline -Follows with Dr. Burt Knack - Amiodarone 223m daily  -Propranolol 164mBID -Not on chronic anticoagulant (secondary lymphoproliferative disorder)  Paroxysmal A Fib  -Currently in NSR -See chronic diastolic CHF  Essential HTN    -  See chronic diastolic CHF  Pulmonary hypertension -See chronic diastolic CHF                      Chronic gout - Allopurinol 160m daily    Hypothyroidism -Synthroid 22m daily   Depression -Zoloft 5051maily  -Klonopin 0.76m22mily prn for anxiety   DM type II controlled with complication                       -5/1 Hemoglobin A1c= 5.7 -Moderate SSI  CKD stage 3(Baseline Cr 1.6-1.9 ) Lab Results  Component Value Date   CREATININE 1.86 (H) 09/10/2015   CREATININE 1.90 (H) 09/09/2015   CREATININE 1.64 (H) 08/16/2015      DVT prophylaxis: SCD Code Status: Partial, no CPR, no defib, no intubation, +pressors okay  Family Communication: Daughter present for discussion of plan Disposition Plan: Resolution sepsis   Consultants:  None   Procedures/Significant Events:  Per Dr. GaryBetsy Codere 6/20:[facility]-slightly hypercellular marrow with trilineage hematopoiesis, interstitial Nieman-Pick like histiocytosis. Negative for dysplasia, negative for lymphoma, negative for increased blasts. 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 -LVEF =60% to 65%.  - Aortic valve:  bioprosthesis was present and functioning normally. - Aorta: Aortic root dimension: 45 mm (ED).- Aortic root: moderately dilated. - Left atrium: severely dilated. - Pulmonary arteries: PA peak pressure: 35 mm Hg (S).    Cultures 9/1 blood 2 pending 9/1 urine positive multiple species 9/2 urine pending   Antimicrobials: Vancomycin 9/1>>   Devices    LINES / TUBES:  None    Continuous Infusions:    Objective: Vitals:   09/10/15 0501 09/10/15 0513 09/10/15 0950 09/10/15 1451  BP: (!) 87/39 (!) 113/59 118/75 (!) 93/48  Pulse: 87 85 83 78  Resp: 16   17  Temp: 99 F (37.2 C)  98.4 F (36.9 C) 99.4 F (37.4 C)  TempSrc: Oral  Oral Oral  SpO2: 100%  99% 98%  Weight:  78.4 kg (172 lb 13.5 oz)    Height:        Intake/Output Summary (Last 24 hours) at 09/10/15 1719 Last data  filed at 09/10/15 1100  Gross per 24 hour  Intake                0 ml  Output             1850 ml  Net            -1850 ml   Filed Weights   09/09/15 1042 09/10/15 0513  Weight: 78.5 kg (173 lb) 78.4 kg (172 lb 13.5 oz)    Examination:  General: A/O 4, appropriate pain for current condition, No acute respiratory distress Eyes: negative scleral hemorrhage, negative anisocoria, negative icterus ENT: Negative Runny nose, negative gingival bleeding, Neck:  Negative scars, masses, torticollis, lymphadenopathy, JVD Lungs: Clear to auscultation bilaterally without wheezes or crackles Cardiovascular: Regular rate and rhythm without murmur gallop or rub normal S1 and S2 Abdomen: negative abdominal pain, nondistended, positive soft, bowel sounds, no rebound, no ascites, no appreciable mass Extremities: L LE erythematous, swollen, warm to touch, painful to palpation and active movement: See picture below  Skin positive laceration calf of LLE: Eschar in place see picture below Psychiatric:  Negative depression, negative anxiety, negative fatigue, negative mania  Central nervous system:  Cranial nerves II through  XII intact, tongue/uvula midline, all extremities muscle strength 5/5, sensation intact throughout, negative dysarthria, negative expressive aphasia, negative receptive aphasia.  Left Lower Extremity     Left  Calf Laceration    Data Reviewed: Care during the described time interval was provided by me .  I have reviewed this patient's available data, including medical history, events of note, physical examination, and all test results as part of my evaluation. I have personally reviewed and interpreted all radiology studies.  CBC:  Recent Labs Lab 09/09/15 1139 09/10/15 0240  WBC 3.2* 2.3*  NEUTROABS 2.4 1.7  HGB 10.4* 9.3*  HCT 31.2* 28.8*  MCV 87.4 88.3  PLT 31* 27*   Basic Metabolic Panel:  Recent Labs Lab 09/09/15 1139 09/10/15 0240  NA 135 137  K 4.1 4.1  CL  104 106  CO2 21* 21*  GLUCOSE 159* 118*  BUN 64* 62*  CREATININE 1.90* 1.86*  CALCIUM 9.0 8.8*   GFR: Estimated Creatinine Clearance: 26.7 mL/min (by C-G formula based on SCr of 1.86 mg/dL). Liver Function Tests: No results for input(s): AST, ALT, ALKPHOS, BILITOT, PROT, ALBUMIN in the last 168 hours. No results for input(s): LIPASE, AMYLASE in the last 168 hours. No results for input(s): AMMONIA in the last 168 hours. Coagulation Profile: No results for input(s): INR, PROTIME in the last 168 hours. Cardiac Enzymes: No results for input(s): CKTOTAL, CKMB, CKMBINDEX, TROPONINI in the last 168 hours. BNP (last 3 results)  Recent Labs  05/09/15 1157  PROBNP 410.0*   HbA1C: No results for input(s): HGBA1C in the last 72 hours. CBG:  Recent Labs Lab 09/09/15 1708 09/09/15 2130 09/10/15 0624 09/10/15 1147 09/10/15 1620  GLUCAP 149* 202* 157* 164* 128*   Lipid Profile: No results for input(s): CHOL, HDL, LDLCALC, TRIG, CHOLHDL, LDLDIRECT in the last 72 hours. Thyroid Function Tests: No results for input(s): TSH, T4TOTAL, FREET4, T3FREE, THYROIDAB in the last 72 hours. Anemia Panel: No results for input(s): VITAMINB12, FOLATE, FERRITIN, TIBC, IRON, RETICCTPCT in the last 72 hours. Sepsis Labs:  Recent Labs Lab 09/09/15 1204  LATICACIDVEN 1.52    Recent Results (from the past 240 hour(s))  Urine culture     Status: Abnormal   Collection Time: 09/09/15 12:49 PM  Result Value Ref Range Status   Specimen Description URINE, CLEAN CATCH  Final   Special Requests Normal  Final   Culture MULTIPLE SPECIES PRESENT, SUGGEST RECOLLECTION (A)  Final   Report Status 09/10/2015 FINAL  Final         Radiology Studies: No results found.      Scheduled Meds: . allopurinol  100 mg Oral Daily  . amiodarone  200 mg Oral Daily  . insulin aspart  0-15 Units Subcutaneous TID WC  . insulin aspart  0-5 Units Subcutaneous QHS  . levothyroxine  25 mcg Oral QAC breakfast  .  polyethylene glycol  17 g Oral Daily  . propranolol  10 mg Oral BID  . sertraline  50 mg Oral QHS  . vancomycin  750 mg Intravenous Q24H   Continuous Infusions:    LOS: 0 days    Time spent:40 min    Rory Xiang, Geraldo Docker, MD [facility] Pager [phone]  If 7PM-7AM, please contact night-coverage [email or web address] Password [facility] 09/10/2015, 5:19 PM

## 2015-09-10 NOTE — Progress Notes (Addendum)
CRITICAL VALUE ALERT  Critical value received:  Platelet 27  Date of notification:  09/10/2015  Time of notification:  3:50am  Critical value read back:Yes.    Nurse who received alert:  Steffanie Dunn   MD notified (1st page):  MD on call  Time of first page:  Not as of yet  MD notified (2nd page):  Time of second page:  Responding MD:    Time MD responded:

## 2015-09-11 DIAGNOSIS — D619 Aplastic anemia, unspecified: Secondary | ICD-10-CM | POA: Diagnosis present

## 2015-09-11 LAB — CBC WITH DIFFERENTIAL/PLATELET
BASOS PCT: 1 %
Basophils Absolute: 0 10*3/uL (ref 0.0–0.1)
EOS ABS: 0 10*3/uL (ref 0.0–0.7)
Eosinophils Relative: 1 %
HEMATOCRIT: 26.8 % — AB (ref 39.0–52.0)
Hemoglobin: 8.8 g/dL — ABNORMAL LOW (ref 13.0–17.0)
LYMPHS ABS: 0.4 10*3/uL — AB (ref 0.7–4.0)
Lymphocytes Relative: 23 %
MCH: 29.1 pg (ref 26.0–34.0)
MCHC: 32.8 g/dL (ref 30.0–36.0)
MCV: 88.7 fL (ref 78.0–100.0)
MONO ABS: 0.1 10*3/uL (ref 0.1–1.0)
MONOS PCT: 7 %
Neutro Abs: 1.2 10*3/uL — ABNORMAL LOW (ref 1.7–7.7)
Neutrophils Relative %: 69 %
Platelets: 22 10*3/uL — CL (ref 150–400)
RBC: 3.02 MIL/uL — ABNORMAL LOW (ref 4.22–5.81)
RDW: 16.8 % — AB (ref 11.5–15.5)
WBC: 1.8 10*3/uL — ABNORMAL LOW (ref 4.0–10.5)

## 2015-09-11 LAB — COMPREHENSIVE METABOLIC PANEL
ALK PHOS: 77 U/L (ref 38–126)
ALT: 25 U/L (ref 17–63)
AST: 26 U/L (ref 15–41)
Albumin: 2.9 g/dL — ABNORMAL LOW (ref 3.5–5.0)
Anion gap: 6 (ref 5–15)
BUN: 67 mg/dL — AB (ref 6–20)
CALCIUM: 8.7 mg/dL — AB (ref 8.9–10.3)
CHLORIDE: 108 mmol/L (ref 101–111)
CO2: 24 mmol/L (ref 22–32)
CREATININE: 1.94 mg/dL — AB (ref 0.61–1.24)
GFR calc Af Amer: 33 mL/min — ABNORMAL LOW (ref >60)
GFR calc non Af Amer: 29 mL/min — ABNORMAL LOW (ref >60)
GLUCOSE: 169 mg/dL — AB (ref 65–99)
Potassium: 3.9 mmol/L (ref 3.5–5.1)
SODIUM: 138 mmol/L (ref 135–145)
Total Bilirubin: 1.1 mg/dL (ref 0.3–1.2)
Total Protein: 5.1 g/dL — ABNORMAL LOW (ref 6.5–8.1)

## 2015-09-11 LAB — GLUCOSE, CAPILLARY
GLUCOSE-CAPILLARY: 136 mg/dL — AB (ref 65–99)
GLUCOSE-CAPILLARY: 149 mg/dL — AB (ref 65–99)
GLUCOSE-CAPILLARY: 120 mg/dL — AB (ref 65–99)
Glucose-Capillary: 144 mg/dL — ABNORMAL HIGH (ref 65–99)

## 2015-09-11 LAB — URINE CULTURE: Culture: <10000 — AB

## 2015-09-11 LAB — MAGNESIUM: MAGNESIUM: 2 mg/dL (ref 1.7–2.4)

## 2015-09-11 LAB — LACTIC ACID, PLASMA: LACTIC ACID, VENOUS: 1.4 mmol/L (ref 0.5–1.9)

## 2015-09-11 MED ORDER — SODIUM CHLORIDE 0.9 % IV BOLUS (SEPSIS)
1000.0000 mL | Freq: Once | INTRAVENOUS | Status: AC
  Administered 2015-09-11: 1000 mL via INTRAVENOUS

## 2015-09-11 MED ORDER — PROPRANOLOL HCL 10 MG PO TABS
5.0000 mg | ORAL_TABLET | Freq: Two times a day (BID) | ORAL | Status: DC
  Administered 2015-09-11 – 2015-09-22 (×22): 5 mg via ORAL
  Filled 2015-09-11 (×25): qty 0.5

## 2015-09-11 MED ORDER — SODIUM CHLORIDE 0.9 % IV SOLN
INTRAVENOUS | Status: DC
  Administered 2015-09-11 – 2015-09-12 (×3): via INTRAVENOUS

## 2015-09-11 NOTE — Progress Notes (Signed)
PROGRESS NOTE    [name]  VOJ:[id number] DOB: 1922/04/05 DOA: 09/09/2015 PCP: Binnie Rail, MD     Brief Narrative:  82 y.o. WM PMHx  Severe AS--> S/P Aortic valve replacement surgery at [place] on 01/23/2011 (porcine valve); S/P transcatheter aortic valve replacement [place] 05/14/2014 (bovine valve) , Chronic Diastolic CHF,PAF not on anticoagulation,Bradycardia,QT prolongation, CKD (chronic kidney disease), stage III, Lymphoproliferative DO,Throat cancer , Severe Pancytopenia followed by Dr. Benay Spice, Recurrent Cellulitis of left leg.   He has had recurrent left lower leg cellulitis in the past; most recently admitted for septic shock due to cellulitis. He also has history of pancytopenia and on chart review, it appears that his recurrent cellulitis may be attributed to his low cell counts. At time of discharge early August 2017, he was discharged on keflex. Since discharge, he had another episode of LLE cellulitis and was placed on doxycycline outpatient. His last dose is in 2 days. This episode of LLE swelling started yesterday, without any trauma. He was seen at [facility] last night and work up was negative including negative [place] for DVT. He was seen at [facility] office this morning for follow up. Due to his chronic pancytopenia and previous history of septic shock, they recommended hospital evaluation for his recurrent left lower leg cellulitis and IV abx therapy. He admits to LLE swelling and pain in his skin but not within the joint. His pain is worse with movement and pressure. He also admits to nausea without vomiting. He denies any fevers, chills, chest pain, shortness of breath, cough, diarrhea, abdominal pain. He also denies any spontaneous bleeding such as epistaxis or gum bleeding. No hematochezia.    Subjective: 9/3 A/O 4. Significant LLE pain greatest in foot (slowly improving). Initially started out secondary to laceration on left calf from cooler.   Assessment & Plan:     Principal Problem:   Cellulitis of leg, left Active Problems:   Type 2 diabetes mellitus with complication (HCC)   Gout   Essential hypertension   Other pancytopenia (HCC)   Hypothyroidism   Paroxysmal atrial fibrillation (HCC)   CKD (chronic kidney disease), stage III   Depression   S/P TAVR (transcatheter aortic valve replacement)   GERD (gastroesophageal reflux disease)   Chronic diastolic congestive heart failure (HCC)   Lymphoproliferative disorder (HCC)   Recurrent cellulitis of lower leg    Recurrent left lower leg cellulitis -Patient with recurrent cellulitis requiring multiple rounds of antibiotics. Consult ID on 9/3 since patient has had multiple aortic valve replacements, and a lymphoproliferative disorder does he need to be on prophylactic antibiotic? -Continue vancomycin until blood cultures return.  -See pictures below -9/3 have consulted Dr. Scharlene Gloss ID will evaluate and give recommendations. Length of treatment? Discharge on prophylactic antibiotics given patient's lack of immune system?  Chronic pancytopenia, lymphoproliferative disorder - Follows with Dr. Betsy Coder CBC Latest Ref Rng & Units 09/11/2015 09/10/2015 09/09/2015  WBC 4.0 - 10.5 K/uL 1.8(L) 2.3(L) 3.2(L)  Hemoglobin 13.0 - 17.0 g/dL 8.8(L) 9.3(L) 10.4(L)  Hematocrit 39.0 - 52.0 % 26.8(L) 28.8(L) 31.2(L)  Platelets 150 - 400 K/uL 22(LL) 27(LL) 31(L)  -No active signs of bleeding: Monitor closely  AS s/p [facility]AVR 2013 at [facility], repair 2016 at [facility] -Monitor blood cultures closely -See recurrent cellulitis  Chronic Diastolic CHF /Hypotension  -Strict I&O since admission -2.0 L -Daily weight Filed Weights   09/09/15 1042 09/10/15 0513  Weight: 78.5 kg (173 lb) 78.4 kg (172 lb 13.5 oz)  -  Discontinue Lasix secondary to infection and patient's relative hypotension -Normal saline bolus 1 L: Then 53m/hr -Follows with Dr. CBurt Knack- Amiodarone 2046mdaily  -Decrease Propranolol 75m36mID -Not on  chronic anticoagulant (secondary lymphoproliferative disorder)  Paroxysmal A Fib  -Currently in NSR -See chronic diastolic CHF  Essential HTN    -See chronic diastolic CHF  Pulmonary hypertension -See chronic diastolic CHF                      Chronic gout - Allopurinol 100m88mily   Hypothyroidism -Synthroid 275mc62mily   Depression -Zoloft 50mg 49my  -Klonopin 0.275mg d33m prn for anxiety   DM type II controlled with complication                       -5/1 Hemoglobin A1c= 5.7 -Moderate SSI  CKD stage 3(Baseline Cr 1.6-1.9 ) Lab Results  Component Value Date   CREATININE 1.94 (H) 09/11/2015   CREATININE 1.86 (H) 09/10/2015   CREATININE 1.90 (H) 09/09/2015      DVT prophylaxis: SCD Code Status: Partial, no CPR, no defib, no intubation, +pressors okay  Family Communication: Daughter present for discussion of plan Disposition Plan: Resolution sepsis. Await ID recommendations   Consultants:  Dr. Robert Scharlene Glossrocedures/Significant Events:  Per Dr. Gary ShBetsy Coder/20:[facility]-slightly hypercellular marrow with trilineage hematopoiesis, interstitial Nieman-Pick like histiocytosis. Negative for dysplasia, negative for lymphoma, negative for increased blasts. 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 -LVEF =60% to 65%.  - Aortic valve:  bioprosthesis was present and functioning normally. - Aorta: Aortic root dimension: 45 mm (ED).- Aortic root: moderately dilated. - Left atrium: severely dilated. - Pulmonary arteries: PA peak pressure: 35 mm Hg (S).    Cultures 9/1 blood 2 pending 9/1 urine positive multiple species 9/2 urine pending   Antimicrobials: Vancomycin 9/1>>   Devices    LINES / TUBES:  None    Continuous Infusions:    Objective: Vitals:   09/10/15 0950 09/10/15 1451  09/10/15 2035 09/11/15 0543  BP: 118/75 (!) 93/48 (!) 91/42 100/61  Pulse: 83 78 73 80  Resp:  _0 Temp: 98.4 F (36.9 C) 99.4 F (37.4 C) 98.3 F (36.8 C) 98 F (36.7 C)  TempSrc: Oral Oral Oral Oral  SpO2: 99% 98% 98% 100%  Weight:      Height:        Intake/Output Summary (Last 24 hours) at 09/11/15 0908 Last data filed at 09/11/15 0851  [id number] per 24 hour  Intake              802 ml  Output             2000 ml  Net            -1198 ml   Filed Weights   09/09/15 1042 09/10/15 0513  Weight: 78.5 kg (173 lb) 78.4 kg (172 lb 13.5 oz)    Examination:  General: A/O 4, appropriate pain for current condition, No acute respiratory distress Eyes: negative scleral hemorrhage, negative anisocoria, negative icterus ENT: Negative Runny nose, negative gingival bleeding, Neck:  Negative scars, masses, torticollis, lymphadenopathy, JVD Lungs: Clear to auscultation bilaterally without wheezes or crackles Cardiovascular: Regular rate and rhythm without murmur gallop or rub normal S1 and S2 Abdomen: negative abdominal pain, nondistended, positive soft, bowel sounds, no  rebound, no ascites, no appreciable mass Extremities: L LE erythematous, swollen, warm to touch, painful to palpation and active movement:Patient able to wiggle toes today, decreased foot swelling See picture below  Skin positive laceration calf of LLE: Eschar in place see picture below Psychiatric:  Negative depression, negative anxiety, negative fatigue, negative mania  Central nervous system:  Cranial nerves II through XII intact, tongue/uvula midline, all extremities muscle strength 5/5, sensation intact throughout, negative dysarthria, negative expressive aphasia, negative receptive aphasia.  Left Lower Extremity     Left  Calf Laceration    Data Reviewed: Care during the described time interval was provided by me .  I have reviewed this patient's available data, including medical history, events of note,  physical examination, and all test results as part of my evaluation. I have personally reviewed and interpreted all radiology studies.  CBC:  Recent Labs Lab 09/09/15 1139 09/10/15 0240 09/11/15 0322  WBC 3.2* 2.3* 1.8*  NEUTROABS 2.4 1.7 1.2*  HGB 10.4* 9.3* 8.8*  HCT 31.2* 28.8* 26.8*  MCV 87.4 88.3 88.7  PLT 31* 27* 22*   Basic Metabolic Panel:  Recent Labs Lab 09/09/15 1139 09/10/15 0240 09/11/15 0322  NA 135 137 138  K 4.1 4.1 3.9  CL 104 106 108  CO2 21* 21* 24  GLUCOSE 159* 118* 169*  BUN 64* 62* 67*  CREATININE 1.90* 1.86* 1.94*  CALCIUM 9.0 8.8* 8.7*  MG  --   --  2.0   GFR: Estimated Creatinine Clearance: 25.6 mL/min (by C-G formula based on SCr of 1.94 mg/dL). Liver Function Tests:  Recent Labs Lab 09/11/15 0322  AST 26  ALT 25  ALKPHOS 77  BILITOT 1.1  PROT 5.1*  ALBUMIN 2.9*   No results for input(s): LIPASE, AMYLASE in the last 168 hours. No results for input(s): AMMONIA in the last 168 hours. Coagulation Profile: No results for input(s): INR, PROTIME in the last 168 hours. Cardiac Enzymes: No results for input(s): CKTOTAL, CKMB, CKMBINDEX, TROPONINI in the last 168 hours. BNP (last 3 results)  Recent Labs  05/09/15 1157  PROBNP 410.0*   HbA1C: No results for input(s): HGBA1C in the last 72 hours. CBG:  Recent Labs Lab 09/10/15 0624 09/10/15 1147 09/10/15 1620 09/10/15 2039 09/11/15 0701  GLUCAP 157* 164* 128* 142* 136*   Lipid Profile: No results for input(s): CHOL, HDL, LDLCALC, TRIG, CHOLHDL, LDLDIRECT in the last 72 hours. Thyroid Function Tests: No results for input(s): TSH, T4TOTAL, FREET4, T3FREE, THYROIDAB in the last 72 hours. Anemia Panel: No results for input(s): VITAMINB12, FOLATE, FERRITIN, TIBC, IRON, RETICCTPCT in the last 72 hours. Sepsis Labs:  Recent Labs Lab 09/09/15 1204 09/11/15 0322  LATICACIDVEN 1.52 1.4    Recent Results (from the past 240 hour(s))  Culture, blood (routine x 2)     Status:  None (Preliminary result)   Collection Time: 09/09/15 11:50 AM  Result Value Ref Range Status   Specimen Description BLOOD RIGHT ANTECUBITAL  Final   Special Requests BOTTLES DRAWN AEROBIC AND ANAEROBIC 5CC  Final   Culture NO GROWTH 1 DAY  Final   Report Status PENDING  Incomplete  Culture, blood (routine x 2)     Status: None (Preliminary result)   Collection Time: 09/09/15 12:09 PM  Result Value Ref Range Status   Specimen Description BLOOD RIGHT FOREARM  Final   Special Requests BOTTLES DRAWN AEROBIC AND ANAEROBIC 5CC  Final   Culture NO GROWTH 1 DAY  Final   Report Status PENDING  Incomplete  Urine culture     Status: Abnormal   Collection Time: 09/09/15 12:49 PM  Result Value Ref Range Status   Specimen Description URINE, CLEAN CATCH  Final   Special Requests Normal  Final   Culture MULTIPLE SPECIES PRESENT, SUGGEST RECOLLECTION (A)  Final   Report Status 09/10/2015 FINAL  Final         Radiology Studies: No results found.      Scheduled Meds: . allopurinol  100 mg Oral Daily  . amiodarone  200 mg Oral Daily  . insulin aspart  0-15 Units Subcutaneous TID WC  . insulin aspart  0-5 Units Subcutaneous QHS  . levothyroxine  25 mcg Oral QAC breakfast  . polyethylene glycol  17 g Oral Daily  . propranolol  10 mg Oral BID  . sertraline  50 mg Oral QHS  . vancomycin  750 mg Intravenous Q24H   Continuous Infusions:    LOS: 1 day    Time spent:40 min    [name], Walter Docker, MD [facility] Pager [phone]  If 7PM-7AM, please contact night-coverage [email or web address] Password [email or web address] 09/11/2015, 9:08 AM

## 2015-09-12 ENCOUNTER — Encounter (HOSPITAL_COMMUNITY): Payer: Self-pay | Admitting: *Deleted

## 2015-09-12 DIAGNOSIS — B351 Tinea unguium: Secondary | ICD-10-CM

## 2015-09-12 DIAGNOSIS — C140 Malignant neoplasm of pharynx, unspecified: Secondary | ICD-10-CM | POA: Insufficient documentation

## 2015-09-12 DIAGNOSIS — M10072 Idiopathic gout, left ankle and foot: Secondary | ICD-10-CM

## 2015-09-12 LAB — COMPREHENSIVE METABOLIC PANEL
ALBUMIN: 2.8 g/dL — AB (ref 3.5–5.0)
ALT: 26 U/L (ref 17–63)
ANION GAP: 3 — AB (ref 5–15)
AST: 28 U/L (ref 15–41)
Alkaline Phosphatase: 82 U/L (ref 38–126)
BUN: 59 mg/dL — ABNORMAL HIGH (ref 6–20)
CO2: 24 mmol/L (ref 22–32)
Calcium: 8.3 mg/dL — ABNORMAL LOW (ref 8.9–10.3)
Chloride: 110 mmol/L (ref 101–111)
Creatinine, Ser: 1.72 mg/dL — ABNORMAL HIGH (ref 0.61–1.24)
GFR calc non Af Amer: 33 mL/min — ABNORMAL LOW (ref >60)
GFR, EST AFRICAN AMERICAN: 38 mL/min — AB (ref >60)
GLUCOSE: 153 mg/dL — AB (ref 65–99)
POTASSIUM: 4.4 mmol/L (ref 3.5–5.1)
SODIUM: 137 mmol/L (ref 135–145)
Total Bilirubin: 0.9 mg/dL (ref 0.3–1.2)
Total Protein: 4.9 g/dL — ABNORMAL LOW (ref 6.5–8.1)

## 2015-09-12 LAB — CBC WITH DIFFERENTIAL/PLATELET
BASOS ABS: 0 10*3/uL (ref 0.0–0.1)
Basophils Relative: 1 %
Eosinophils Absolute: 0 10*3/uL (ref 0.0–0.7)
Eosinophils Relative: 1 %
HEMATOCRIT: 27.2 % — AB (ref 39.0–52.0)
HEMOGLOBIN: 8.7 g/dL — AB (ref 13.0–17.0)
LYMPHS PCT: 22 %
Lymphs Abs: 0.4 10*3/uL — ABNORMAL LOW (ref 0.7–4.0)
MCH: 28.9 pg (ref 26.0–34.0)
MCHC: 32 g/dL (ref 30.0–36.0)
MCV: 90.4 fL (ref 78.0–100.0)
MONO ABS: 0.1 10*3/uL (ref 0.1–1.0)
MONOS PCT: 7 %
NEUTROS ABS: 1.3 10*3/uL — AB (ref 1.7–7.7)
NEUTROS PCT: 69 %
Platelets: 20 10*3/uL — CL (ref 150–400)
RBC: 3.01 MIL/uL — ABNORMAL LOW (ref 4.22–5.81)
RDW: 16.5 % — AB (ref 11.5–15.5)
WBC: 1.9 10*3/uL — ABNORMAL LOW (ref 4.0–10.5)

## 2015-09-12 LAB — GLUCOSE, CAPILLARY
GLUCOSE-CAPILLARY: 153 mg/dL — AB (ref 65–99)
GLUCOSE-CAPILLARY: 121 mg/dL — AB (ref 65–99)
Glucose-Capillary: 167 mg/dL — ABNORMAL HIGH (ref 65–99)
Glucose-Capillary: 153 mg/dL — ABNORMAL HIGH (ref 65–99)

## 2015-09-12 LAB — MAGNESIUM: Magnesium: 2 mg/dL (ref 1.7–2.4)

## 2015-09-12 LAB — DIFFERENTIAL
BASOS ABS: 0 10*3/uL (ref 0.0–0.1)
BASOS PCT: 0 %
Eosinophils Absolute: 0 10*3/uL (ref 0.0–0.7)
Eosinophils Relative: 1 %
LYMPHS PCT: 17 %
Lymphs Abs: 0.4 10*3/uL — ABNORMAL LOW (ref 0.7–4.0)
MONO ABS: 0.1 10*3/uL (ref 0.1–1.0)
Monocytes Relative: 6 %
NEUTROS ABS: 1.9 10*3/uL (ref 1.7–7.7)
Neutrophils Relative %: 76 %

## 2015-09-12 LAB — URIC ACID: URIC ACID, SERUM: 7 mg/dL (ref 4.4–7.6)

## 2015-09-12 MED ORDER — COLCHICINE 0.6 MG PO TABS
0.6000 mg | ORAL_TABLET | Freq: Two times a day (BID) | ORAL | Status: DC
  Administered 2015-09-12 – 2015-09-13 (×3): 0.6 mg via ORAL
  Filled 2015-09-12 (×3): qty 1

## 2015-09-12 NOTE — Progress Notes (Signed)
Pharmacy Antibiotic Note  [name] is a 82 y.o. male admitted on 09/09/2015 with cellulitis.  Pharmacy has been consulted for vancomycin dosing. Of note, patient was on cephalexin and doxycyline recently prior to admission for cellulitis.   Patient is afebrile and WBC is low at 1.9 (chronic pancytopenia). His serum creatinine remains consistently elevated above baseline.   Plan: - Continue vancomycin 750mg  IV q24h  - Monitor renal function, C/S, and clinical status - Vancomycin trough for tomorrow (9/5) ordered - F/U ID recs  Height: 5\' 10"  (177.8 cm) Weight: 172 lb 14.4 oz (78.4 kg) IBW/kg (Calculated) : 73  Temp (24hrs), Avg:98.1 F (36.7 C), Min:98 F (36.7 C), Max:98.2 F (36.8 C)   Recent Labs Lab 09/09/15 1139 09/09/15 1204 09/10/15 0240 09/11/15 0322 09/12/15 0156  WBC 3.2*  --  2.3* 1.8* 1.9*  CREATININE 1.90*  --  1.86* 1.94* 1.72*  LATICACIDVEN  --  1.52  --  1.4  --     Estimated Creatinine Clearance: 28.9 mL/min (by C-G formula based on SCr of 1.72 mg/dL).    Allergies  Allergen Reactions  . Penicillins Other (See Comments)    08/15/2015:  Tolerated Cefepime Does not remember rxn (~1950) Has patient had a PCN reaction causing immediate rash, facial/tongue/throat swelling, SOB or lightheadedness with hypotension:YES Has patient had a PCN reaction causing severe rash involving mucus membranes or skin necrosis: NO Has patient had a PCN reaction that required hospitalization NO Has patient had a PCN reaction occurring within the last 10 years: NO If all of the above answers are "NO", then may proceed with Cephalosporin use.  [name] Kitchen Neomycin-Bacitracin Zn-Polymyx Rash and Other (See Comments)    ? Reaction (thinks he remembers redness)    Antimicrobials this admission: Vanc 9/1>>  Dose adjustments this admission: 9/3 BCx x2: NG x2 day 9/2 UCx: < 10,000 colonies   Microbiology results: Pending  Thank you for allowing pharmacy to be a part of this patient's  care.  Demetrius Charity, PharmD Acute Care Pharmacy Resident  Pager: [phone] 09/12/2015

## 2015-09-12 NOTE — Consult Note (Addendum)
[facility]ious Disease       Reason for Consult: prevention of future cellulitis episodes    Referring Physician: Dr. Sinclair Ship  Principal Problem:   Left leg cellulitis Active Problems:   Type 2 diabetes mellitus with complication (HCC)   Gout   Essential hypertension   Other pancytopenia (HCC)   Hypothyroidism   Paroxysmal atrial fibrillation (HCC)   CKD (chronic kidney disease), stage III   Depression   S/P TAVR (transcatheter aortic valve replacement)   GERD (gastroesophageal reflux disease)   Chronic diastolic congestive heart failure (HCC)   Lymphoproliferative disorder (HCC)   Recurrent cellulitis of lower leg   Anemia due to bone marrow failure ([place])   . amiodarone  200 mg Oral Daily  . colchicine  0.6 mg Oral BID  . insulin aspart  0-15 Units Subcutaneous TID WC  . insulin aspart  0-5 Units Subcutaneous QHS  . levothyroxine  25 mcg Oral QAC breakfast  . polyethylene glycol  17 g Oral Daily  . propranolol  5 mg Oral BID  . sertraline  50 mg Oral QHS  . vancomycin  750 mg Intravenous Q24H    Recommendations: Start colchicine Stop allopurinol for possible acute gout Consider ortho evaluation   Assessment: He has some erythema around his left ankle with exquisite tenderness around the joint and joint warmth most c/w inflammatory joint such as gout.  He has a history of gout and on chronic allopurinol but never had a crystal evaluation to his knowledge.  Uric acid wnl.   Does this need aspiration (septic vs gout)? I do not feel an MRI will be helpful at this time   I discussed podiatry care for his foot onychomycosis Has pancytopenia of unknown etiology.    Dr. Johnnye Sima to follow up tomorrow  Antibiotics: vancomycin  HPI: [name] is a 82 y.o. male with a reported history of recurrent cellulitis on both right and left leg this year who initially presented to an OSH ED 8/31 with ongoing right leg edema.  He was admitted here in  August with sepsis with cellulitis as a source and was concerned for recurrent cellulitis.  After discharge he again had more pain and swelling in his leg and had two additional courses of antibiotics including doxycycline and keflex.  Despite repeated antibiotics it persisted and came to ED and admitted for cellulitis on 9/1 and started on vancomycin.  He has had little improvement since admission and biggest issue is continued pain.  No fever, no chills.      Review of Systems:  Constitutional: negative for fevers, chills and anorexia Gastrointestinal: negative for nausea and diarrhea Neurological: negative for headaches All other systems reviewed and are negative   Past Medical History:  Diagnosis Date  . Action tremor   . Anxiety   . Aortic stenosis    a. Previously severe -> s/p minimally invasive tissue AVR with Dr. Evelina Dun at [facility] 01/2011 (pre-AVR cath with no obs CAD);  b. 05/2014 s/p TAVR (23 mm Edwards Sapien 3).  . Bacteremia    a. 12/2012 - S bovis;  b. 12/2012 TEE w/o veg;  c. Seeing ID->Rocephin therapy extended to 01/25/2013 via PICC for possible endocarditis (No veg on TEE).  . Bradycardia   . Cellulitis 09/09/2015   LEFT LEG  . Cellulitis of left leg 10/11-16/2012  . Chronic diastolic CHF (congestive heart failure) ([place])    a. 12/15/2012 TEE: EF 60-65%, no veg.  . CKD (chronic  kidney disease), stage III   . Colon polyp   . Degenerative disc disease   . Diabetes mellitus    "borderline" (01/05/2013)  . Diverticulosis   . GERD (gastroesophageal reflux disease)   . Gout   . H/O hiatal hernia   . High cholesterol   . History of blood transfusion 01/2011; 11/2012  . Hypertension   . Hypotension   . Hypothyroidism   . Joint effusion, knee    left knee  . Leukopenia    Chronic pancytopenia  . PAF (paroxysmal atrial fibrillation) (HCC)    a. amiodarone therapy; not felt to be a candidate for anticoagulation with pancytopenia. b. Back in AF 09/2014 - decision was made to  pursue rate control only.  . Pancytopenia ([place])    a. possible chronic lymphoproliferative disorder or splenic lymphoma.  . QT prolongation   . S/P TAVR (transcatheter aortic valve replacement)    a. 05/14/2014 TAVR: 23 mm Edwards Sapien 3 transcatheter heart valve placed valve-in-valve for prosthetic valve dysfunction via open right transfemoral approach  . Splenomegaly   . Stroke ([place])   . Synovial cyst of popliteal space   . Throat cancer ([place])    s/p lasered  . Thrombocytopenia ([place])    Dr. Benay Spice    Social History  Substance Use Topics  . Smoking status: Former Smoker    Packs/day: 0.75    Years: 24.00    Types: Cigarettes    Quit date: 01/11/1962  . Smokeless tobacco: Never Used  . Alcohol use No    Family History  Problem Relation Age of Onset  . Lung cancer Mother   . Heart disease Father   . Colon cancer Other   . Stroke Other   . Cancer Brother     Allergies  Allergen Reactions  . Penicillins Other (See Comments)    08/15/2015:  Tolerated Cefepime Does not remember rxn (~1950) Has patient had a PCN reaction causing immediate rash, facial/tongue/throat swelling, SOB or lightheadedness with hypotension:YES Has patient had a PCN reaction causing severe rash involving mucus membranes or skin necrosis: NO Has patient had a PCN reaction that required hospitalization NO Has patient had a PCN reaction occurring within the last 10 years: NO If all of the above answers are "NO", then may proceed with Cephalosporin use.  [name] Kitchen Neomycin-Bacitracin Zn-Polymyx Rash and Other (See Comments)    ? Reaction (thinks he remembers redness)    Physical Exam: Constitutional: in no apparent distress and alert  Vitals:   09/12/15 0646 09/12/15 1300  BP: 118/69 119/65  Pulse: 75 71  Resp: 16 16  Temp: 98 F (36.7 C) 98.7 F (37.1 C)   EYES: anicteric ENMT: no thrush Cardiovascular: Cor RRR Respiratory: CTA B; normal respiratory effort GI: Bowel sounds are normal, liver is not  enlarged, spleen is not enlarged Musculoskeletal: left leg with tenderness over medial ankle and in foot, warmth mainly around joint Skin: negatives: no rash Hematologic: cervical lad  Lab Results  Component Value Date   WBC 1.9 (L) 09/12/2015   HGB 8.7 (L) 09/12/2015   HCT 27.2 (L) 09/12/2015   MCV 90.4 09/12/2015   PLT 20 (LL) 09/12/2015    Lab Results  Component Value Date   CREATININE 1.72 (H) 09/12/2015   BUN 59 (H) 09/12/2015   NA 137 09/12/2015   K 4.4 09/12/2015   CL 110 09/12/2015   CO2 24 09/12/2015    Lab Results  Component Value Date   ALT 26 09/12/2015  AST 28 09/12/2015   ALKPHOS 82 09/12/2015     Microbiology: Recent Results (from the past 240 hour(s))  Culture, blood (routine x 2)     Status: None (Preliminary result)   Collection Time: 09/09/15 11:50 AM  Result Value Ref Range Status   Specimen Description BLOOD RIGHT ANTECUBITAL  Final   Special Requests BOTTLES DRAWN AEROBIC AND ANAEROBIC 5CC  Final   Culture NO GROWTH 3 DAYS  Final   Report Status PENDING  Incomplete  Culture, blood (routine x 2)     Status: None (Preliminary result)   Collection Time: 09/09/15 12:09 PM  Result Value Ref Range Status   Specimen Description BLOOD RIGHT FOREARM  Final   Special Requests BOTTLES DRAWN AEROBIC AND ANAEROBIC 5CC  Final   Culture NO GROWTH 3 DAYS  Final   Report Status PENDING  Incomplete  Urine culture     Status: Abnormal   Collection Time: 09/09/15 12:49 PM  Result Value Ref Range Status   Specimen Description URINE, CLEAN CATCH  Final   Special Requests Normal  Final   Culture MULTIPLE SPECIES PRESENT, SUGGEST RECOLLECTION (A)  Final   Report Status 09/10/2015 FINAL  Final  Culture, Urine     Status: Abnormal   Collection Time: 09/10/15  1:10 PM  Result Value Ref Range Status   Specimen Description URINE, RANDOM  Final   Special Requests NONE  Final   Culture <10,000 COLONIES/mL INSIGNIFICANT GROWTH (A)  Final   Report Status 09/11/2015  FINAL  Final    Noura Purpura, Herbie [place], [facility] for Infectious Disease [facility] [email or web address] [phone] pager  [phone] cell 09/12/2015, 1:47 PM

## 2015-09-12 NOTE — Progress Notes (Signed)
PROGRESS NOTE    FARD BORUNDA  DXI:[id number] DOB: 15-Sep-1922 DOA: 09/09/2015 PCP: Binnie Rail, MD     Brief Narrative:  82 y.o. WM PMHx  Severe AS--> S/P Aortic valve replacement surgery at [facility] on 01/23/2011 (porcine valve); S/P transcatheter aortic valve replacement [place] 05/14/2014 (bovine valve) , Chronic Diastolic CHF,PAF not on anticoagulation,Bradycardia,QT prolongation, CKD (chronic kidney disease), stage III, Lymphoproliferative DO,Throat cancer , Severe Pancytopenia followed by Dr. Benay Spice, Recurrent Cellulitis of left leg.   He has had recurrent left lower leg cellulitis in the past; most recently admitted for septic shock due to cellulitis. He also has history of pancytopenia and on chart review, it appears that his recurrent cellulitis may be attributed to his low cell counts. At time of discharge early August 2017, he was discharged on keflex. Since discharge, he had another episode of LLE cellulitis and was placed on doxycycline outpatient. His last dose is in 2 days. This episode of LLE swelling started yesterday, without any trauma. He was seen at [facility] last night and work up was negative including negative [place] for DVT. He was seen at [facility] office this morning for follow up. Due to his chronic pancytopenia and previous history of septic shock, they recommended hospital evaluation for his recurrent left lower leg cellulitis and IV abx therapy. He admits to LLE swelling and pain in his skin but not within the joint. His pain is worse with movement and pressure. He also admits to nausea without vomiting. He denies any fevers, chills, chest pain, shortness of breath, cough, diarrhea, abdominal pain. He also denies any spontaneous bleeding such as epistaxis or gum bleeding. No hematochezia.    Subjective:   Significant LLE pain  Around ankle   Daughter by the bedside   Assessment & Plan:   Principal Problem:   Left leg cellulitis Active Problems:   Type 2 diabetes  mellitus with complication (HCC)   Gout   Essential hypertension   Other pancytopenia (HCC)   Hypothyroidism   Paroxysmal atrial fibrillation (HCC)   CKD (chronic kidney disease), stage III   Depression   S/P TAVR (transcatheter aortic valve replacement)   GERD (gastroesophageal reflux disease)   Chronic diastolic congestive heart failure (HCC)   Lymphoproliferative disorder (HCC)   Recurrent cellulitis of lower leg   Anemia due to bone marrow failure (HCC)    Recurrent left lower leg cellulitis -Patient with recurrent cellulitis requiring multiple rounds of antibiotics. Consult ID on 9/3 since patient has had multiple aortic valve replacements, and a lymphoproliferative disorder does he need to be on prophylactic antibiotic? -Continue vancomycin until blood cultures return.  Recent negative venous doppler in aug  Dr. Scharlene Gloss ID will evaluate and give recommendations. Length of treatment?  Does he need neupogen ? Also ordered MRI of leg and ankle to r/o deep seated abcess   Chronic pancytopenia, lymphoproliferative disorder - Follows with Dr. Betsy Coder -No active signs of bleeding: Monitor closely Patient has pancytopenia that may be contributing to recurrent cellulitis   AS s/p [facility]AVR 2013 at [facility], repair 2016 at [facility] -Monitor blood cultures closely -See recurrent cellulitis    Chronic Diastolic CHF /Hypotension  -Strict I&O since admission -2.0 L -Daily weight -Discontinue Lasix secondary to infection and patient's relative hypotension -Normal saline bolus 1 L: Then 70m/hr -Follows with Dr. CBurt Knack- Amiodarone 2013mdaily  -Decrease Propranolol 92m58mID -Not on chronic anticoagulant (secondary lymphoproliferative disorder)   Paroxysmal A Fib  -Currently in NSR -  See chronic diastolic CHF  Essential HTN    -See chronic diastolic CHF  Pulmonary hypertension -See chronic diastolic CHF                      Chronic gout - Allopurinol 171m  daily   Hypothyroidism -Synthroid 278m daily   Depression -Zoloft 5068maily  -Klonopin 0.16m31mily prn for anxiety   DM type II controlled with complication                       -5/1 Hemoglobin A1c= 5.7 -Moderate SSI  CKD stage 3(Baseline Cr 1.6-1.9 ) Lab Results  Component Value Date   CREATININE 1.72 (H) 09/12/2015   CREATININE 1.94 (H) 09/11/2015   CREATININE 1.86 (H) 09/10/2015      DVT prophylaxis: SCD Code Status: Partial, no CPR, no defib, no intubation, +pressors okay  Family Communication: Daughter present for discussion of plan Disposition Plan: Resolution sepsis. Await ID recommendations   Consultants:  Dr. RobeScharlene Gloss  Procedures/Significant Events:  Per Dr. GaryBetsy Codere 6/20:[facility]slightly hypercellular marrow with trilineage hematopoiesis, interstitial Nieman-Pick like histiocytosis. Negative for dysplasia, negative for lymphoma, negative for increased blasts. 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 -LVEF =60% to 65%.  - Aortic valve:  bioprosthesis was present and functioning normally. - Aorta: Aortic root dimension: 45 mm (ED).- Aortic root: moderately dilated. - Left atrium: severely dilated. - Pulmonary arteries: PA peak pressure: 35 mm Hg (S).    Cultures 9/1 blood 2 pending 9/1 urine positive multiple species 9/2 urine pending   Antimicrobials: Vancomycin 9/1>>   Devices    LINES / TUBES:  None    Continuous Infusions: . sodium chloride 75 mL/hr at 09/12/15 0600     Objective: Vitals:   09/11/15 1014 09/11/15 1247 09/11/15 2142 09/12/15 0646  BP: (!) 106/57 (!) 96/53 102/65 118/69  Pulse: 73 82 91 75  Resp:  18 16 16   Temp:  98 F (36.7 C) 98.2 F (36.8 C) 98 F (36.7 C)  TempSrc:   Oral Oral  SpO2:  100% 97% 98%  Weight:      Height:         Intake/Output Summary (Last 24 hours) at 09/12/15 0931 Last data filed at 09/12/15 [id number] per 24 hour  Intake          1986.25 ml  Output             1200 ml  Net           786.25 ml   Filed Weights   09/09/15 1042 09/10/15 0513 09/11/15 0800  Weight: 78.5 kg (173 lb) 78.4 kg (172 lb 13.5 oz) 78.4 kg (172 lb 14.4 oz)    Examination:  General: A/O 4, appropriate pain for current condition, No acute respiratory distress Eyes: negative scleral hemorrhage, negative anisocoria, negative icterus ENT: Negative Runny nose, negative gingival bleeding, Neck:  Negative scars, masses, torticollis, lymphadenopathy, JVD Lungs: Clear to auscultation bilaterally without wheezes or crackles Cardiovascular: Regular rate and rhythm without murmur gallop or rub normal S1 and S2 Abdomen: negative abdominal pain, nondistended, positive soft, bowel sounds, no rebound, no ascites, no appreciable mass Extremities: L LE erythematous, swollen, warm to touch, painful to palpation and active movement:Patient able to wiggle toes today, decreased foot swelling See picture below  Skin positive laceration  calf of LLE: Eschar in place see picture below Psychiatric:  Negative depression, negative anxiety, negative fatigue, negative mania  Central nervous system:  Cranial nerves II through XII intact, tongue/uvula midline, all extremities muscle strength 5/5, sensation intact throughout, negative dysarthria, negative expressive aphasia, negative receptive aphasia.  Left Lower Extremity     Left  Calf Laceration       CBC:  Recent Labs Lab 09/09/15 1139 09/10/15 0240 09/11/15 0322 09/12/15 0156  WBC 3.2* 2.3* 1.8* 1.9*  NEUTROABS 2.4 1.7 1.2* 1.3*  HGB 10.4* 9.3* 8.8* 8.7*  HCT 31.2* 28.8* 26.8* 27.2*  MCV 87.4 88.3 88.7 90.4  PLT 31* 27* 22* 20*   Basic Metabolic Panel:  Recent Labs Lab 09/09/15 1139 09/10/15 0240 09/11/15 0322 09/12/15 0156  NA 135 137 138 137  K 4.1 4.1 3.9 4.4   CL 104 106 108 110  CO2 21* 21* 24 24  GLUCOSE 159* 118* 169* 153*  BUN 64* 62* 67* 59*  CREATININE 1.90* 1.86* 1.94* 1.72*  CALCIUM 9.0 8.8* 8.7* 8.3*  MG  --   --  2.0 2.0   GFR: Estimated Creatinine Clearance: 28.9 mL/min (by C-G formula based on SCr of 1.72 mg/dL). Liver Function Tests:  Recent Labs Lab 09/11/15 0322 09/12/15 0156  AST 26 28  ALT 25 26  ALKPHOS 77 82  BILITOT 1.1 0.9  PROT 5.1* 4.9*  ALBUMIN 2.9* 2.8*   No results for input(s): LIPASE, AMYLASE in the last 168 hours. No results for input(s): AMMONIA in the last 168 hours. Coagulation Profile: No results for input(s): INR, PROTIME in the last 168 hours. Cardiac Enzymes: No results for input(s): CKTOTAL, CKMB, CKMBINDEX, TROPONINI in the last 168 hours. BNP (last 3 results)  Recent Labs  05/09/15 1157  PROBNP 410.0*   HbA1C: No results for input(s): HGBA1C in the last 72 hours. CBG:  Recent Labs Lab 09/11/15 0701 09/11/15 1130 09/11/15 1640 09/11/15 2146 09/12/15 0650  GLUCAP 136* 149* 120* 144* 153*   Lipid Profile: No results for input(s): CHOL, HDL, LDLCALC, TRIG, CHOLHDL, LDLDIRECT in the last 72 hours. Thyroid Function Tests: No results for input(s): TSH, T4TOTAL, FREET4, T3FREE, THYROIDAB in the last 72 hours. Anemia Panel: No results for input(s): VITAMINB12, FOLATE, FERRITIN, TIBC, IRON, RETICCTPCT in the last 72 hours. Sepsis Labs:  Recent Labs Lab 09/09/15 1204 09/11/15 0322  LATICACIDVEN 1.52 1.4    Recent Results (from the past 240 hour(s))  Culture, blood (routine x 2)     Status: None (Preliminary result)   Collection Time: 09/09/15 11:50 AM  Result Value Ref Range Status   Specimen Description BLOOD RIGHT ANTECUBITAL  Final   Special Requests BOTTLES DRAWN AEROBIC AND ANAEROBIC 5CC  Final   Culture NO GROWTH 2 DAYS  Final   Report Status PENDING  Incomplete  Culture, blood (routine x 2)     Status: None (Preliminary result)   Collection Time: 09/09/15 12:09 PM   Result Value Ref Range Status   Specimen Description BLOOD RIGHT FOREARM  Final   Special Requests BOTTLES DRAWN AEROBIC AND ANAEROBIC 5CC  Final   Culture NO GROWTH 2 DAYS  Final   Report Status PENDING  Incomplete  Urine culture     Status: Abnormal   Collection Time: 09/09/15 12:49 PM  Result Value Ref Range Status   Specimen Description URINE, CLEAN CATCH  Final   Special Requests Normal  Final   Culture MULTIPLE SPECIES PRESENT, SUGGEST RECOLLECTION (A)  Final   Report  Status 09/10/2015 FINAL  Final  Culture, Urine     Status: Abnormal   Collection Time: 09/10/15  1:10 PM  Result Value Ref Range Status   Specimen Description URINE, RANDOM  Final   Special Requests NONE  Final   Culture <10,000 COLONIES/mL INSIGNIFICANT GROWTH (A)  Final   Report Status 09/11/2015 FINAL  Final         Radiology Studies: No results found.      Scheduled Meds: . allopurinol  100 mg Oral Daily  . amiodarone  200 mg Oral Daily  . insulin aspart  0-15 Units Subcutaneous TID WC  . insulin aspart  0-5 Units Subcutaneous QHS  . levothyroxine  25 mcg Oral QAC breakfast  . polyethylene glycol  17 g Oral Daily  . propranolol  5 mg Oral BID  . sertraline  50 mg Oral QHS  . vancomycin  750 mg Intravenous Q24H   Continuous Infusions: . sodium chloride 75 mL/hr at 09/12/15 0600     LOS: 2 days    Time spent:40 min    Reyne Dumas, MD [facility] Pager [phone]  If 7PM-7AM, please contact night-coverage [email or web address] Password [email or web address] 09/12/2015, 9:31 AM

## 2015-09-13 ENCOUNTER — Inpatient Hospital Stay (HOSPITAL_COMMUNITY): Payer: Commercial Managed Care - HMO

## 2015-09-13 DIAGNOSIS — L03116 Cellulitis of left lower limb: Principal | ICD-10-CM

## 2015-09-13 DIAGNOSIS — B9689 Other specified bacterial agents as the cause of diseases classified elsewhere: Secondary | ICD-10-CM

## 2015-09-13 LAB — CBC WITH DIFFERENTIAL/PLATELET
BASOS ABS: 0 10*3/uL (ref 0.0–0.1)
Basophils Relative: 0 %
Eosinophils Absolute: 0 10*3/uL (ref 0.0–0.7)
Eosinophils Relative: 1 %
HCT: 28.2 % — ABNORMAL LOW (ref 39.0–52.0)
HEMOGLOBIN: 8.8 g/dL — AB (ref 13.0–17.0)
LYMPHS ABS: 0.3 10*3/uL — AB (ref 0.7–4.0)
LYMPHS PCT: 15 %
MCH: 28.4 pg (ref 26.0–34.0)
MCHC: 31.2 g/dL (ref 30.0–36.0)
MCV: 91 fL (ref 78.0–100.0)
Monocytes Absolute: 0.1 10*3/uL (ref 0.1–1.0)
Monocytes Relative: 7 %
NEUTROS ABS: 1.6 10*3/uL — AB (ref 1.7–7.7)
NEUTROS PCT: 78 %
Platelets: 22 10*3/uL — CL (ref 150–400)
RBC: 3.1 MIL/uL — AB (ref 4.22–5.81)
RDW: 16.6 % — ABNORMAL HIGH (ref 11.5–15.5)
WBC: 2 10*3/uL — AB (ref 4.0–10.5)

## 2015-09-13 LAB — COMPREHENSIVE METABOLIC PANEL
ALT: 29 U/L (ref 17–63)
ANION GAP: 5 (ref 5–15)
AST: 29 U/L (ref 15–41)
Albumin: 2.8 g/dL — ABNORMAL LOW (ref 3.5–5.0)
Alkaline Phosphatase: 96 U/L (ref 38–126)
BUN: 50 mg/dL — ABNORMAL HIGH (ref 6–20)
CHLORIDE: 113 mmol/L — AB (ref 101–111)
CO2: 21 mmol/L — ABNORMAL LOW (ref 22–32)
Calcium: 8.6 mg/dL — ABNORMAL LOW (ref 8.9–10.3)
Creatinine, Ser: 1.4 mg/dL — ABNORMAL HIGH (ref 0.61–1.24)
GFR, EST AFRICAN AMERICAN: 49 mL/min — AB (ref >60)
GFR, EST NON AFRICAN AMERICAN: 42 mL/min — AB (ref >60)
Glucose, Bld: 158 mg/dL — ABNORMAL HIGH (ref 65–99)
POTASSIUM: 4.6 mmol/L (ref 3.5–5.1)
Sodium: 139 mmol/L (ref 135–145)
TOTAL PROTEIN: 5.2 g/dL — AB (ref 6.5–8.1)
Total Bilirubin: 1 mg/dL (ref 0.3–1.2)

## 2015-09-13 LAB — GLUCOSE, CAPILLARY
GLUCOSE-CAPILLARY: 112 mg/dL — AB (ref 65–99)
Glucose-Capillary: 159 mg/dL — ABNORMAL HIGH (ref 65–99)
Glucose-Capillary: 183 mg/dL — ABNORMAL HIGH (ref 65–99)
Glucose-Capillary: 242 mg/dL — ABNORMAL HIGH (ref 65–99)

## 2015-09-13 LAB — VANCOMYCIN, TROUGH: VANCOMYCIN TR: 14 ug/mL — AB (ref 15–20)

## 2015-09-13 MED ORDER — METHYLPREDNISOLONE SODIUM SUCC 125 MG IJ SOLR
40.0000 mg | Freq: Two times a day (BID) | INTRAMUSCULAR | Status: AC
  Administered 2015-09-13 – 2015-09-14 (×4): 40 mg via INTRAVENOUS
  Filled 2015-09-13 (×4): qty 2

## 2015-09-13 MED ORDER — LORAZEPAM 1 MG PO TABS: 1.0000 mg | ORAL_TABLET | Freq: Once | ORAL | Status: DC

## 2015-09-13 NOTE — Consult Note (Signed)
   [facility] CM Inpatient Consult   09/13/2015  [name] 09-03-1922 [id number]  Received a message from [place] that the patient has admitted to the hospital.  Chart HX reveals the patient is a 82 y.o. male with a reported history of recurrent cellulitis on both right and left legs this year. HX of HF, DM type II, Stage III Kidney Disease.   He was admitted here in August with sepsis with cellulitis as a source and was concerned for recurrent cellulitis.  Will follow up for post hospital follow up needs with [place] Management.  For questions, please contact:  Natividad Brood, RN BSN [facility] Liaison  [phone] business mobile phone Toll free office [phone]

## 2015-09-13 NOTE — Care Management Important Message (Signed)
Important Message  Patient Details  Name: [name] MRN: [id number] Date of Birth: 1922/08/10   Medicare Important Message Given:  Yes    Blaine Hari 09/13/2015, 10:23 AM

## 2015-09-13 NOTE — Progress Notes (Signed)
Pharmacy Antibiotic Note  [name] is a 82 y.o. male admitted on 09/09/2015 with cellulitis.  Pharmacy has been consulted for vancomycin dosing. Of note, patient was on cephalexin and doxycyline recently prior to admission for cellulitis.   Day #5 of vancomycin for cellulitis vs gout. Afebrile, WBC low at2. LA 1.4. Was on Keflex and doxy PTA. Cultures still pending. No improvement in pain/swelling since admission. ID changed allopurinol to colchicine. May need ortho eval? VT today drawn a little early since dose yesterday was late. VT is therapeutic.  Plan: Continue vancomycin 750mg  IV Q24 Monitor clinical picture, renal function, VT prn F/U C&S, abx deescalation / LOT, ID recs  Height: 5\' 10"  (177.8 cm) Weight: 172 lb 14.4 oz (78.4 kg) IBW/kg (Calculated) : 73  Temp (24hrs), Avg:98.3 F (36.8 C), Min:98 F (36.7 C), Max:98.6 F (37 C)   Recent Labs Lab 09/09/15 1139 09/09/15 1204 09/10/15 0240 09/11/15 0322 09/12/15 0156 09/13/15 0453 09/13/15 1216  WBC 3.2*  --  2.3* 1.8* 1.9* 2.0*  --   CREATININE 1.90*  --  1.86* 1.94* 1.72* 1.40*  --   LATICACIDVEN  --  1.52  --  1.4  --   --   --   VANCOTROUGH  --   --   --   --   --   --  14*    Estimated Creatinine Clearance: 35.5 mL/min (by C-G formula based on SCr of 1.4 mg/dL).    Allergies  Allergen Reactions  . Penicillins Other (See Comments)    08/15/2015:  Tolerated Cefepime Does not remember rxn (~1950) Has patient had a PCN reaction causing immediate rash, facial/tongue/throat swelling, SOB or lightheadedness with hypotension:YES Has patient had a PCN reaction causing severe rash involving mucus membranes or skin necrosis: NO Has patient had a PCN reaction that required hospitalization NO Has patient had a PCN reaction occurring within the last 10 years: NO If all of the above answers are "NO", then may proceed with Cephalosporin use.  [name] Kitchen Neomycin-Bacitracin Zn-Polymyx Rash and Other (See Comments)    ? Reaction  (thinks he remembers redness)    Antimicrobials this admission: Vanc 9/1 >>  Dose adjustments this admission: 9/5 VT = 14  Microbiology results: 9/3 BCx x2: ngtd 9/2 UCx: < 10,000 colonies   Thank you for allowing pharmacy to be a part of this patient's care.  Elenor Quinones, PharmD, BCPS Clinical Pharmacist Pager [phone] 09/13/2015 1:28 PM

## 2015-09-13 NOTE — Progress Notes (Deleted)
Pt's hallucinations progressively grown worse; as per NTs, pt 'see and hear people by the window and around his room'; pt hops on one foot while moving around his room and down the hallways with a walker; Ativan 1 mg PO was given at 2305 as part of his CIWA protocol with a score of 10 but apparently non-effective; recent VS taken and were nonremarkable; Dr. Ninfa Linden paged and notified of recent events; ordered additional one time dose of 1 mg Ativan PO, safety sitter was requested, of which he agreed to order as well. Will continue to monitor.

## 2015-09-13 NOTE — Progress Notes (Signed)
PROGRESS NOTE    [name]  XTA:[id number] DOB: October 04, 1922 DOA: 09/09/2015 PCP: Binnie Rail, MD     Brief Narrative:  82 y.o. WM PMHx  Severe AS--> S/P Aortic valve replacement surgery at [place] on 01/23/2011 (porcine valve); S/P transcatheter aortic valve replacement [place] 05/14/2014 (bovine valve) , Chronic Diastolic CHF,PAF not on anticoagulation,Bradycardia,QT prolongation, CKD (chronic kidney disease), stage III, Lymphoproliferative DO,Throat cancer , Severe Pancytopenia followed by Dr. Benay Spice, Recurrent Cellulitis of left leg.   He has had recurrent left lower leg cellulitis in the past; most recently admitted for septic shock due to cellulitis. He also has history of pancytopenia and on chart review, it appears that his recurrent cellulitis may be attributed to his low cell counts. At time of discharge early August 2017, he was discharged on keflex. Since discharge, he had another episode of LLE cellulitis and was placed on doxycycline outpatient. His last dose is in 2 days. This episode of LLE swelling started yesterday, without any trauma. He was seen at [facility] last night and work up was negative including negative [place] for DVT. He was seen at [facility] office this morning for follow up. Due to his chronic pancytopenia and previous history of septic shock, they recommended hospital evaluation for his recurrent left lower leg cellulitis and IV abx therapy.   Patient admitted for treatment of cellulitis-very slow to improve, infectious disease consulted.  Subjective:  Feels that the left ankle redness and pain is more today  Assessment & Plan:   Principal Problem:   Left leg cellulitis Active Problems:   Type 2 diabetes mellitus with complication (HCC)   Gout   Essential hypertension   Other pancytopenia (HCC)   Hypothyroidism   Paroxysmal atrial fibrillation (HCC)   CKD (chronic kidney disease), stage III   Depression   S/P TAVR (transcatheter aortic valve  replacement)   GERD (gastroesophageal reflux disease)   Chronic diastolic congestive heart failure (HCC)   Lymphoproliferative disorder (HCC)   Recurrent cellulitis of lower leg   Anemia due to bone marrow failure (HCC)    Recurrent left lower leg cellulitis -Patient with recurrent cellulitis requiring multiple rounds of antibiotics. Consult ID on 9/3 since patient has had multiple antibiotics/ aortic valve replacements, and a lymphoproliferative disorder . -Continue vancomycin as recommended by infectious disease-not sure if the patient would benefit from Neupogen to boost his white blood cell count..  Recent negative venous doppler in aug ID will evaluate and give recommendations. Length of treatment?  MRI negative for deep-seated abscess, infectious disease feels that pain/redness more related to gout than cellulitis. Will start patient on steroids IV 2 days and see if the patient improves. If it improves continue prednisone taper after completion of 4 doses of IV Solu-Medrol   Chronic pancytopenia, lymphoproliferative disorder - Follows with Dr. Betsy Coder, discussed the benefit of Neupogen, he feels that his white count is unchanged for several years, and he may not benefit from receiving Neupogen -No active signs of bleeding despite thrombocytopenia    AS s/p TAVR 2013 at [facility], repair 2016 at [place] blood cultures closely. No concern for endocarditis -See above    Chronic Diastolic CHF /Hypotension  -Daily weight -Discontinue Lasix secondary to infection and patient's relative hypotension Will hold IV fluids due to increasing lower extremity edema -Follows with Dr. Burt Knack - Amiodarone 28m daily  Propranolol 566mBID -Not on chronic anticoagulant (secondary lymphoproliferative disorder)   Paroxysmal A Fib  -Currently in NSR -See chronic  diastolic CHF  Essential HTN    -See chronic diastolic CHF  Pulmonary hypertension -See chronic diastolic  CHF                      Chronic gout - Allopurinol 165m daily  Also started on colchicine, uric acid 7.0  Hypothyroidism -Synthroid 214m daily   Depression -Zoloft 5026maily  -Klonopin 0.17m53mily prn for anxiety   DM type II controlled with complication                       -5/1 Hemoglobin A1c= 5.7 -Moderate SSI   CKD stage 3(Baseline Cr 1.6-1.9 )-improving Lab Results  Component Value Date   CREATININE 1.40 (H) 09/13/2015   CREATININE 1.72 (H) 09/12/2015   CREATININE 1.94 (H) 09/11/2015      DVT prophylaxis: SCD Code Status: Partial, no CPR, no defib, no intubation, +pressors okay  Family Communication: Daughter present for discussion of plan Disposition Plan: Resolution sepsis. Await ID recommendations   Consultants:  Dr. RobeScharlene Gloss  Procedures/Significant Events:  Per Dr. GaryBetsy Codere 6/20:[facility]-slightly hypercellular marrow with trilineage hematopoiesis, interstitial Nieman-Pick like histiocytosis. Negative for dysplasia, negative for lymphoma, negative for increased blasts. 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 -LVEF =60% to 65%.  - Aortic valve:  bioprosthesis was present and functioning normally. - Aorta: Aortic root dimension: 45 mm (ED).- Aortic root: moderately dilated. - Left atrium: severely dilated. - Pulmonary arteries: PA peak pressure: 35 mm Hg (S).    Cultures 9/1 blood 2 pending 9/1 urine positive multiple species 9/2 urine pending   Antimicrobials: Vancomycin 9/1>>   Devices    LINES / TUBES:  None    Continuous Infusions: . sodium chloride 75 mL/hr at 09/13/15 0942     Objective: Vitals:   09/12/15 0646 09/12/15 1300 09/12/15 2054 09/13/15 0613  BP: 118/69 119/65 121/74 113/66  Pulse: 75 71 84 85  Resp: 16 16 16 16   Temp: 98 F (36.7 C) 98.7  F (37.1 C) 98.4 F (36.9 C) 98.6 F (37 C)  TempSrc: Oral Oral Oral Oral  SpO2: 98% 100% 100% 99%  Weight:      Height:        Intake/Output Summary (Last 24 hours) at 09/13/15 0953 Last data filed at 09/13/15 0900  Gross per 24 hour  Intake              960 ml  Output             1180 ml  Net             -220 ml   Filed Weights   09/09/15 1042 09/10/15 0513 09/11/15 0800  Weight: 78.5 kg (173 lb) 78.4 kg (172 lb 13.5 oz) 78.4 kg (172 lb 14.4 oz)    Examination:  General: A/O 4, appropriate pain for current condition, No acute respiratory distress Eyes: negative scleral hemorrhage, negative anisocoria, negative icterus ENT: Negative Runny nose, negative gingival bleeding, Neck:  Negative scars, masses, torticollis, lymphadenopathy, JVD Lungs: Clear to auscultation bilaterally without wheezes or crackles Cardiovascular: Regular rate and rhythm without murmur gallop or rub normal S1 and S2 Abdomen: negative abdominal pain, nondistended, positive soft, bowel sounds, no rebound, no ascites, no appreciable mass Extremities: L LE erythematous, swollen, warm to touch, painful to palpation and active movement:Patient able to  wiggle toes today, decreased foot swelling See picture below  Skin positive laceration calf of LLE: Eschar in place see picture below Psychiatric:  Negative depression, negative anxiety, negative fatigue, negative mania  Central nervous system:  Cranial nerves II through XII intact, tongue/uvula midline, all extremities muscle strength 5/5, sensation intact throughout, negative dysarthria, negative expressive aphasia, negative receptive aphasia.  Left Lower Extremity     Left  Calf Laceration       CBC:  Recent Labs Lab 09/09/15 1139 09/10/15 0240 09/11/15 0322 09/12/15 0156 09/12/15 1011 09/13/15 0453  WBC 3.2* 2.3* 1.8* 1.9*  --  2.0*  NEUTROABS 2.4 1.7 1.2* 1.3* 1.9 1.6*  HGB 10.4* 9.3* 8.8* 8.7*  --  8.8*  HCT 31.2* 28.8* 26.8* 27.2*  --   28.2*  MCV 87.4 88.3 88.7 90.4  --  91.0  PLT 31* 27* 22* 20*  --  22*   Basic Metabolic Panel:  Recent Labs Lab 09/09/15 1139 09/10/15 0240 09/11/15 0322 09/12/15 0156 09/13/15 0453  NA 135 137 138 137 139  K 4.1 4.1 3.9 4.4 4.6  CL 104 106 108 110 113*  CO2 21* 21* 24 24 21*  GLUCOSE 159* 118* 169* 153* 158*  BUN 64* 62* 67* 59* 50*  CREATININE 1.90* 1.86* 1.94* 1.72* 1.40*  CALCIUM 9.0 8.8* 8.7* 8.3* 8.6*  MG  --   --  2.0 2.0  --    GFR: Estimated Creatinine Clearance: 35.5 mL/min (by C-G formula based on SCr of 1.4 mg/dL). Liver Function Tests:  Recent Labs Lab 09/11/15 0322 09/12/15 0156 09/13/15 0453  AST 26 28 29   ALT 25 26 29   ALKPHOS 77 82 96  BILITOT 1.1 0.9 1.0  PROT 5.1* 4.9* 5.2*  ALBUMIN 2.9* 2.8* 2.8*   No results for input(s): LIPASE, AMYLASE in the last 168 hours. No results for input(s): AMMONIA in the last 168 hours. Coagulation Profile: No results for input(s): INR, PROTIME in the last 168 hours. Cardiac Enzymes: No results for input(s): CKTOTAL, CKMB, CKMBINDEX, TROPONINI in the last 168 hours. BNP (last 3 results)  Recent Labs  05/09/15 1157  PROBNP 410.0*   HbA1C: No results for input(s): HGBA1C in the last 72 hours. CBG:  Recent Labs Lab 09/12/15 0650 09/12/15 1145 09/12/15 1621 09/12/15 2103 09/13/15 0618  GLUCAP 153* 167* 121* 153* 159*   Lipid Profile: No results for input(s): CHOL, HDL, LDLCALC, TRIG, CHOLHDL, LDLDIRECT in the last 72 hours. Thyroid Function Tests: No results for input(s): TSH, T4TOTAL, FREET4, T3FREE, THYROIDAB in the last 72 hours. Anemia Panel: No results for input(s): VITAMINB12, FOLATE, FERRITIN, TIBC, IRON, RETICCTPCT in the last 72 hours. Sepsis Labs:  Recent Labs Lab 09/09/15 1204 09/11/15 0322  LATICACIDVEN 1.52 1.4    Recent Results (from the past 240 hour(s))  Culture, blood (routine x 2)     Status: None (Preliminary result)   Collection Time: 09/09/15 11:50 AM  Result Value  Ref Range Status   Specimen Description BLOOD RIGHT ANTECUBITAL  Final   Special Requests BOTTLES DRAWN AEROBIC AND ANAEROBIC 5CC  Final   Culture NO GROWTH 3 DAYS  Final   Report Status PENDING  Incomplete  Culture, blood (routine x 2)     Status: None (Preliminary result)   Collection Time: 09/09/15 12:09 PM  Result Value Ref Range Status   Specimen Description BLOOD RIGHT FOREARM  Final   Special Requests BOTTLES DRAWN AEROBIC AND ANAEROBIC 5CC  Final   Culture NO GROWTH 3 DAYS  Final   Report Status PENDING  Incomplete  Urine culture     Status: Abnormal   Collection Time: 09/09/15 12:49 PM  Result Value Ref Range Status   Specimen Description URINE, CLEAN CATCH  Final   Special Requests Normal  Final   Culture MULTIPLE SPECIES PRESENT, SUGGEST RECOLLECTION (A)  Final   Report Status 09/10/2015 FINAL  Final  Culture, Urine     Status: Abnormal   Collection Time: 09/10/15  1:10 PM  Result Value Ref Range Status   Specimen Description URINE, RANDOM  Final   Special Requests NONE  Final   Culture <10,000 COLONIES/mL INSIGNIFICANT GROWTH (A)  Final   Report Status 09/11/2015 FINAL  Final         Radiology Studies: Mr Tibia Fibula Left Wo Contrast  Result Date: 09/13/2015 CLINICAL DATA:  Recurrent left lower leg and ankle pain and swelling. Question cellulitis. Diabetic patient. No known injury. EXAM: MRI OF LOWER LEFT EXTREMITY WITHOUT CONTRAST; MRI OF THE LEFT ANKLE WITHOUT CONTRAST TECHNIQUE: Multiplanar, multisequence MR imaging of the left lower leg and ankle was performed. No intravenous contrast was administered. COMPARISON:  MRI left lower leg 08/14/2015. FINDINGS: Bones/Joint/Cartilage The axial and coronal sequences incidentally include the right lower leg. There is no bone marrow signal abnormality to suggest osteomyelitis. No fracture or stress change is seen. No worrisome marrow lesion is identified. Ligaments Intact. Muscles and Tendons A small amount of fluid is seen  about the posteromedial tendons of the ankles bilaterally suggestive of tenosynovitis. No tendon tear is identified. Musculature of the lower leg demonstrates some fatty atrophy which appears symmetric from right to left. No intramuscular fluid collection is identified. Mild edema is seen in the extensor digitorum longus, flexor digitorum longus and tibialis anterior muscles bilaterally, more notable on the left. This could be due to myositis or related to atrophy. Soft tissues Cutaneous and subcutaneous edema is seen bilaterally and worse on the left. Edematous change is progressive inferiorly. The appearance is mildly improved compared to the prior MRI. No focal fluid collection is identified. IMPRESSION: Bilateral lower leg cutaneous and subcutaneous edema is worse on the left and appears slightly improved compared to the prior study. Asymmetry is most in keeping with cellulitis. No abscess or osteomyelitis. Mild edema in the extensor digitorum longus, flexor digitorum longus and tibialis anterior muscles bilaterally appears slightly worse on the left and could be related to mild atrophy or myositis. Small amount of fluid about the posteromedial tendons of the ankles bilaterally is compatible with tenosynovitis without tendon tear. Electronically Signed   By: Inge Rise M.D.   On: 09/13/2015 08:36   Mr Ankle Left  Wo Contrast  Result Date: 09/13/2015 CLINICAL DATA:  Recurrent left lower leg and ankle pain and swelling. Question cellulitis. Diabetic patient. No known injury. EXAM: MRI OF LOWER LEFT EXTREMITY WITHOUT CONTRAST; MRI OF THE LEFT ANKLE WITHOUT CONTRAST TECHNIQUE: Multiplanar, multisequence MR imaging of the left lower leg and ankle was performed. No intravenous contrast was administered. COMPARISON:  MRI left lower leg 08/14/2015. FINDINGS: Bones/Joint/Cartilage The axial and coronal sequences incidentally include the right lower leg. There is no bone marrow signal abnormality to suggest  osteomyelitis. No fracture or stress change is seen. No worrisome marrow lesion is identified. Ligaments Intact. Muscles and Tendons A small amount of fluid is seen about the posteromedial tendons of the ankles bilaterally suggestive of tenosynovitis. No tendon tear is identified. Musculature of the lower leg demonstrates some fatty atrophy which appears symmetric  from right to left. No intramuscular fluid collection is identified. Mild edema is seen in the extensor digitorum longus, flexor digitorum longus and tibialis anterior muscles bilaterally, more notable on the left. This could be due to myositis or related to atrophy. Soft tissues Cutaneous and subcutaneous edema is seen bilaterally and worse on the left. Edematous change is progressive inferiorly. The appearance is mildly improved compared to the prior MRI. No focal fluid collection is identified. IMPRESSION: Bilateral lower leg cutaneous and subcutaneous edema is worse on the left and appears slightly improved compared to the prior study. Asymmetry is most in keeping with cellulitis. No abscess or osteomyelitis. Mild edema in the extensor digitorum longus, flexor digitorum longus and tibialis anterior muscles bilaterally appears slightly worse on the left and could be related to mild atrophy or myositis. Small amount of fluid about the posteromedial tendons of the ankles bilaterally is compatible with tenosynovitis without tendon tear. Electronically Signed   By: Inge Rise M.D.   On: 09/13/2015 08:36        Scheduled Meds: . amiodarone  200 mg Oral Daily  . colchicine  0.6 mg Oral BID  . insulin aspart  0-15 Units Subcutaneous TID WC  . insulin aspart  0-5 Units Subcutaneous QHS  . levothyroxine  25 mcg Oral QAC breakfast  . polyethylene glycol  17 g Oral Daily  . propranolol  5 mg Oral BID  . sertraline  50 mg Oral QHS  . vancomycin  750 mg Intravenous Q24H   Continuous Infusions: . sodium chloride 75 mL/hr at 09/13/15 0942      LOS: 3 days    Time spent:40 min    Reyne Dumas, MD [facility] Pager [phone]  If 7PM-7AM, please contact night-coverage [email or web address] Password [email or web address] 09/13/2015, 9:53 AM

## 2015-09-13 NOTE — Progress Notes (Signed)
INFECTIOUS DISEASE PROGRESS NOTE  ID: [name] is a 82 y.o. male with  Principal Problem:   Left leg cellulitis Active Problems:   Type 2 diabetes mellitus with complication (HCC)   Gout   Essential hypertension   Other pancytopenia (HCC)   Hypothyroidism   Paroxysmal atrial fibrillation (HCC)   CKD (chronic kidney disease), stage III   Depression   S/P TAVR (transcatheter aortic valve replacement)   GERD (gastroesophageal reflux disease)   Chronic diastolic congestive heart failure (HCC)   Lymphoproliferative disorder (HCC)   Recurrent cellulitis of lower leg   Anemia due to bone marrow failure (HCC)  Subjective: Continued pain and swelling in his ankle.   Abtx:  Anti-infectives    Start     Dose/Rate Route Frequency Ordered Stop   09/10/15 1300  vancomycin (VANCOCIN) IVPB 750 mg/150 ml premix     750 mg 150 mL/hr over 60 Minutes Intravenous Every 24 hours 09/09/15 1239     09/09/15 1200  vancomycin (VANCOCIN) 1,500 mg in sodium chloride 0.9 % 500 mL IVPB     1,500 mg 250 mL/hr over 120 Minutes Intravenous  Once 09/09/15 1157 09/09/15 1432   09/09/15 1145  vancomycin (VANCOCIN) IVPB 1000 mg/200 mL premix  Status:  Discontinued     1,000 mg 200 mL/hr over 60 Minutes Intravenous  Once 09/09/15 1142 09/09/15 1142      Medications:  Scheduled: . amiodarone  200 mg Oral Daily  . colchicine  0.6 mg Oral BID  . insulin aspart  0-15 Units Subcutaneous TID WC  . insulin aspart  0-5 Units Subcutaneous QHS  . levothyroxine  25 mcg Oral QAC breakfast  . methylPREDNISolone (SOLU-MEDROL) injection  40 mg Intravenous BID  . polyethylene glycol  17 g Oral Daily  . propranolol  5 mg Oral BID  . sertraline  50 mg Oral QHS  . vancomycin  750 mg Intravenous Q24H    Objective: Vital signs in last 24 hours: Temp:  [98 F (36.7 C)-98.6 F (37 C)] 98 F (36.7 C) (09/05 1223) Pulse Rate:  [77-85] 77 (09/05 1223) Resp:  [16] 16 (09/05 1223) BP: (113-121)/(66-74) 116/67  (09/05 1223) SpO2:  [99 %-100 %] 100 % (09/05 1223)   General appearance: alert, cooperative and no distress Resp: clear to auscultation bilaterally Cardio: regular rate and rhythm GI: normal findings: bowel sounds normal and soft, non-tender Extremities: LLE swollen, tender, heat.   Lab Results  Recent Labs  09/12/15 0156 09/13/15 0453  WBC 1.9* 2.0*  HGB 8.7* 8.8*  HCT 27.2* 28.2*  NA 137 139  K 4.4 4.6  CL 110 113*  CO2 24 21*  BUN 59* 50*  CREATININE 1.72* 1.40*   Liver Panel  Recent Labs  09/12/15 0156 09/13/15 0453  PROT 4.9* 5.2*  ALBUMIN 2.8* 2.8*  AST 28 29  ALT 26 29  ALKPHOS 82 96  BILITOT 0.9 1.0   Sedimentation Rate No results for input(s): ESRSEDRATE in the last 72 hours. C-Reactive Protein No results for input(s): CRP in the last 72 hours.  Microbiology: Recent Results (from the past 240 hour(s))  Culture, blood (routine x 2)     Status: None (Preliminary result)   Collection Time: 09/09/15 11:50 AM  Result Value Ref Range Status   Specimen Description BLOOD RIGHT ANTECUBITAL  Final   Special Requests BOTTLES DRAWN AEROBIC AND ANAEROBIC 5CC  Final   Culture NO GROWTH 4 DAYS  Final   Report Status PENDING  Incomplete  Culture, blood (routine x 2)     Status: None (Preliminary result)   Collection Time: 09/09/15 12:09 PM  Result Value Ref Range Status   Specimen Description BLOOD RIGHT FOREARM  Final   Special Requests BOTTLES DRAWN AEROBIC AND ANAEROBIC 5CC  Final   Culture NO GROWTH 4 DAYS  Final   Report Status PENDING  Incomplete  Urine culture     Status: Abnormal   Collection Time: 09/09/15 12:49 PM  Result Value Ref Range Status   Specimen Description URINE, CLEAN CATCH  Final   Special Requests Normal  Final   Culture MULTIPLE SPECIES PRESENT, SUGGEST RECOLLECTION (A)  Final   Report Status 09/10/2015 FINAL  Final  Culture, Urine     Status: Abnormal   Collection Time: 09/10/15  1:10 PM  Result Value Ref Range Status    Specimen Description URINE, RANDOM  Final   Special Requests NONE  Final   Culture <10,000 COLONIES/mL INSIGNIFICANT GROWTH (A)  Final   Report Status 09/11/2015 FINAL  Final    Studies/Results: Mr Tibia Fibula Left Wo Contrast  Result Date: 09/13/2015 CLINICAL DATA:  Recurrent left lower leg and ankle pain and swelling. Question cellulitis. Diabetic patient. No known injury. EXAM: MRI OF LOWER LEFT EXTREMITY WITHOUT CONTRAST; MRI OF THE LEFT ANKLE WITHOUT CONTRAST TECHNIQUE: Multiplanar, multisequence MR imaging of the left lower leg and ankle was performed. No intravenous contrast was administered. COMPARISON:  MRI left lower leg 08/14/2015. FINDINGS: Bones/Joint/Cartilage The axial and coronal sequences incidentally include the right lower leg. There is no bone marrow signal abnormality to suggest osteomyelitis. No fracture or stress change is seen. No worrisome marrow lesion is identified. Ligaments Intact. Muscles and Tendons A small amount of fluid is seen about the posteromedial tendons of the ankles bilaterally suggestive of tenosynovitis. No tendon tear is identified. Musculature of the lower leg demonstrates some fatty atrophy which appears symmetric from right to left. No intramuscular fluid collection is identified. Mild edema is seen in the extensor digitorum longus, flexor digitorum longus and tibialis anterior muscles bilaterally, more notable on the left. This could be due to myositis or related to atrophy. Soft tissues Cutaneous and subcutaneous edema is seen bilaterally and worse on the left. Edematous change is progressive inferiorly. The appearance is mildly improved compared to the prior MRI. No focal fluid collection is identified. IMPRESSION: Bilateral lower leg cutaneous and subcutaneous edema is worse on the left and appears slightly improved compared to the prior study. Asymmetry is most in keeping with cellulitis. No abscess or osteomyelitis. Mild edema in the extensor digitorum  longus, flexor digitorum longus and tibialis anterior muscles bilaterally appears slightly worse on the left and could be related to mild atrophy or myositis. Small amount of fluid about the posteromedial tendons of the ankles bilaterally is compatible with tenosynovitis without tendon tear. Electronically Signed   By: Inge Rise M.D.   On: 09/13/2015 08:36   Mr Ankle Left  Wo Contrast  Result Date: 09/13/2015 CLINICAL DATA:  Recurrent left lower leg and ankle pain and swelling. Question cellulitis. Diabetic patient. No known injury. EXAM: MRI OF LOWER LEFT EXTREMITY WITHOUT CONTRAST; MRI OF THE LEFT ANKLE WITHOUT CONTRAST TECHNIQUE: Multiplanar, multisequence MR imaging of the left lower leg and ankle was performed. No intravenous contrast was administered. COMPARISON:  MRI left lower leg 08/14/2015. FINDINGS: Bones/Joint/Cartilage The axial and coronal sequences incidentally include the right lower leg. There is no bone marrow signal abnormality to suggest osteomyelitis. No fracture or stress  change is seen. No worrisome marrow lesion is identified. Ligaments Intact. Muscles and Tendons A small amount of fluid is seen about the posteromedial tendons of the ankles bilaterally suggestive of tenosynovitis. No tendon tear is identified. Musculature of the lower leg demonstrates some fatty atrophy which appears symmetric from right to left. No intramuscular fluid collection is identified. Mild edema is seen in the extensor digitorum longus, flexor digitorum longus and tibialis anterior muscles bilaterally, more notable on the left. This could be due to myositis or related to atrophy. Soft tissues Cutaneous and subcutaneous edema is seen bilaterally and worse on the left. Edematous change is progressive inferiorly. The appearance is mildly improved compared to the prior MRI. No focal fluid collection is identified. IMPRESSION: Bilateral lower leg cutaneous and subcutaneous edema is worse on the left and appears  slightly improved compared to the prior study. Asymmetry is most in keeping with cellulitis. No abscess or osteomyelitis. Mild edema in the extensor digitorum longus, flexor digitorum longus and tibialis anterior muscles bilaterally appears slightly worse on the left and could be related to mild atrophy or myositis. Small amount of fluid about the posteromedial tendons of the ankles bilaterally is compatible with tenosynovitis without tendon tear. Electronically Signed   By: Inge Rise M.D.   On: 09/13/2015 08:36     Assessment/Plan: Cellulitis (+/- gout)  Will stop colchicine, appreciate pharmacy input.  Will continue his anbx   Total days of antibiotics: 3 vanco         Bobby Rumpf Infectious Diseases (pager) [phone] [email or web address] 09/13/2015, 4:00 PM  LOS: 3 days

## 2015-09-14 DIAGNOSIS — E872 Acidosis, unspecified: Secondary | ICD-10-CM | POA: Diagnosis not present

## 2015-09-14 DIAGNOSIS — E875 Hyperkalemia: Secondary | ICD-10-CM | POA: Diagnosis not present

## 2015-09-14 LAB — CBC WITH DIFFERENTIAL/PLATELET
BASOS PCT: 0 %
Basophils Absolute: 0 10*3/uL (ref 0.0–0.1)
EOS ABS: 0 10*3/uL (ref 0.0–0.7)
EOS PCT: 0 %
HCT: 27.8 % — ABNORMAL LOW (ref 39.0–52.0)
HEMOGLOBIN: 8.7 g/dL — AB (ref 13.0–17.0)
Lymphocytes Relative: 11 %
Lymphs Abs: 0.3 10*3/uL — ABNORMAL LOW (ref 0.7–4.0)
MCH: 28.1 pg (ref 26.0–34.0)
MCHC: 31.3 g/dL (ref 30.0–36.0)
MCV: 89.7 fL (ref 78.0–100.0)
MONO ABS: 0 10*3/uL — AB (ref 0.1–1.0)
MONOS PCT: 0 %
NEUTROS PCT: 89 %
Neutro Abs: 2.1 10*3/uL (ref 1.7–7.7)
PLATELETS: 29 10*3/uL — AB (ref 150–400)
RBC: 3.1 MIL/uL — ABNORMAL LOW (ref 4.22–5.81)
RDW: 16.4 % — AB (ref 11.5–15.5)
WBC: 2.3 10*3/uL — ABNORMAL LOW (ref 4.0–10.5)

## 2015-09-14 LAB — COMPREHENSIVE METABOLIC PANEL
ALBUMIN: 2.8 g/dL — AB (ref 3.5–5.0)
ALT: 35 U/L (ref 17–63)
ANION GAP: 9 (ref 5–15)
AST: 37 U/L (ref 15–41)
Alkaline Phosphatase: 116 U/L (ref 38–126)
BUN: 52 mg/dL — ABNORMAL HIGH (ref 6–20)
CHLORIDE: 110 mmol/L (ref 101–111)
CO2: 17 mmol/L — AB (ref 22–32)
Calcium: 8.8 mg/dL — ABNORMAL LOW (ref 8.9–10.3)
Creatinine, Ser: 1.61 mg/dL — ABNORMAL HIGH (ref 0.61–1.24)
GFR calc non Af Amer: 36 mL/min — ABNORMAL LOW (ref >60)
GFR, EST AFRICAN AMERICAN: 41 mL/min — AB (ref >60)
GLUCOSE: 263 mg/dL — AB (ref 65–99)
POTASSIUM: 5.3 mmol/L — AB (ref 3.5–5.1)
SODIUM: 136 mmol/L (ref 135–145)
Total Bilirubin: 1 mg/dL (ref 0.3–1.2)
Total Protein: 5 g/dL — ABNORMAL LOW (ref 6.5–8.1)

## 2015-09-14 LAB — GLUCOSE, CAPILLARY
GLUCOSE-CAPILLARY: 299 mg/dL — AB (ref 65–99)
GLUCOSE-CAPILLARY: 251 mg/dL — AB (ref 65–99)
Glucose-Capillary: 222 mg/dL — ABNORMAL HIGH (ref 65–99)
Glucose-Capillary: 270 mg/dL — ABNORMAL HIGH (ref 65–99)

## 2015-09-14 LAB — CULTURE, BLOOD (ROUTINE X 2)
CULTURE: NO GROWTH
CULTURE: NO GROWTH

## 2015-09-14 MED ORDER — INSULIN DETEMIR 100 UNIT/ML ~~LOC~~ SOLN
10.0000 [IU] | Freq: Every day | SUBCUTANEOUS | Status: DC
  Administered 2015-09-14 – 2015-09-15 (×2): 10 [IU] via SUBCUTANEOUS
  Filled 2015-09-14 (×2): qty 0.1

## 2015-09-14 NOTE — Progress Notes (Signed)
INFECTIOUS DISEASE PROGRESS NOTE  ID: [name] is a 82 y.o. male with  Principal Problem:   Left leg cellulitis Active Problems:   Type 2 diabetes mellitus with complication (HCC)   Gout   Essential hypertension   Other pancytopenia (HCC)   Hypothyroidism   Paroxysmal atrial fibrillation (HCC)   CKD (chronic kidney disease), stage III   Depression   S/P TAVR (transcatheter aortic valve replacement)   GERD (gastroesophageal reflux disease)   Chronic diastolic congestive heart failure (HCC)   Lymphoproliferative disorder (HCC)   Recurrent cellulitis of lower leg   Anemia due to bone marrow failure (HCC)   Metabolic acidosis   Hyperkalemia  Subjective: Worried his foot is worse  Abtx:  Anti-infectives    Start     Dose/Rate Route Frequency Ordered Stop   09/10/15 1300  vancomycin (VANCOCIN) IVPB 750 mg/150 ml premix     750 mg 150 mL/hr over 60 Minutes Intravenous Every 24 hours 09/09/15 1239     09/09/15 1200  vancomycin (VANCOCIN) 1,500 mg in sodium chloride 0.9 % 500 mL IVPB     1,500 mg 250 mL/hr over 120 Minutes Intravenous  Once 09/09/15 1157 09/09/15 1432   09/09/15 1145  vancomycin (VANCOCIN) IVPB 1000 mg/200 mL premix  Status:  Discontinued     1,000 mg 200 mL/hr over 60 Minutes Intravenous  Once 09/09/15 1142 09/09/15 1142      Medications:  Scheduled: . amiodarone  200 mg Oral Daily  . insulin aspart  0-15 Units Subcutaneous TID WC  . insulin aspart  0-5 Units Subcutaneous QHS  . insulin detemir  10 Units Subcutaneous Daily  . levothyroxine  25 mcg Oral QAC breakfast  . methylPREDNISolone (SOLU-MEDROL) injection  40 mg Intravenous BID  . polyethylene glycol  17 g Oral Daily  . propranolol  5 mg Oral BID  . sertraline  50 mg Oral QHS  . vancomycin  750 mg Intravenous Q24H    Objective: Vital signs in last 24 hours: Temp:  [97.6 F (36.4 C)-98.1 F (36.7 C)] 97.6 F (36.4 C) (09/06 1407) Pulse Rate:  [82-97] 82 (09/06 1407) Resp:  [16-19]  16 (09/06 1407) BP: (95-131)/(52-77) 131/77 (09/06 1407) SpO2:  [97 %-98 %] 98 % (09/06 1407)   General appearance: alert, cooperative and no distress Extremities: mild increase in heat in L vs R. mild edema in LLE. both decreased over last 24h.   Lab Results  Recent Labs  09/13/15 0453 09/14/15 0549  WBC 2.0* 2.3*  HGB 8.8* 8.7*  HCT 28.2* 27.8*  NA 139 136  K 4.6 5.3*  CL 113* 110  CO2 21* 17*  BUN 50* 52*  CREATININE 1.40* 1.61*   Liver Panel  Recent Labs  09/13/15 0453 09/14/15 0549  PROT 5.2* 5.0*  ALBUMIN 2.8* 2.8*  AST 29 37  ALT 29 35  ALKPHOS 96 116  BILITOT 1.0 1.0   Sedimentation Rate No results for input(s): ESRSEDRATE in the last 72 hours. C-Reactive Protein No results for input(s): CRP in the last 72 hours.  Microbiology: Recent Results (from the past 240 hour(s))  Culture, blood (routine x 2)     Status: None   Collection Time: 09/09/15 11:50 AM  Result Value Ref Range Status   Specimen Description BLOOD RIGHT ANTECUBITAL  Final   Special Requests BOTTLES DRAWN AEROBIC AND ANAEROBIC 5CC  Final   Culture NO GROWTH 5 DAYS  Final   Report Status 09/14/2015 FINAL  Final  Culture,  blood (routine x 2)     Status: None   Collection Time: 09/09/15 12:09 PM  Result Value Ref Range Status   Specimen Description BLOOD RIGHT FOREARM  Final   Special Requests BOTTLES DRAWN AEROBIC AND ANAEROBIC 5CC  Final   Culture NO GROWTH 5 DAYS  Final   Report Status 09/14/2015 FINAL  Final  Urine culture     Status: Abnormal   Collection Time: 09/09/15 12:49 PM  Result Value Ref Range Status   Specimen Description URINE, CLEAN CATCH  Final   Special Requests Normal  Final   Culture MULTIPLE SPECIES PRESENT, SUGGEST RECOLLECTION (A)  Final   Report Status 09/10/2015 FINAL  Final  Culture, Urine     Status: Abnormal   Collection Time: 09/10/15  1:10 PM  Result Value Ref Range Status   Specimen Description URINE, RANDOM  Final   Special Requests NONE  Final    Culture <10,000 COLONIES/mL INSIGNIFICANT GROWTH (A)  Final   Report Status 09/11/2015 FINAL  Final    Studies/Results: Mr Tibia Fibula Left Wo Contrast  Result Date: 09/13/2015 CLINICAL DATA:  Recurrent left lower leg and ankle pain and swelling. Question cellulitis. Diabetic patient. No known injury. EXAM: MRI OF LOWER LEFT EXTREMITY WITHOUT CONTRAST; MRI OF THE LEFT ANKLE WITHOUT CONTRAST TECHNIQUE: Multiplanar, multisequence MR imaging of the left lower leg and ankle was performed. No intravenous contrast was administered. COMPARISON:  MRI left lower leg 08/14/2015. FINDINGS: Bones/Joint/Cartilage The axial and coronal sequences incidentally include the right lower leg. There is no bone marrow signal abnormality to suggest osteomyelitis. No fracture or stress change is seen. No worrisome marrow lesion is identified. Ligaments Intact. Muscles and Tendons A small amount of fluid is seen about the posteromedial tendons of the ankles bilaterally suggestive of tenosynovitis. No tendon tear is identified. Musculature of the lower leg demonstrates some fatty atrophy which appears symmetric from right to left. No intramuscular fluid collection is identified. Mild edema is seen in the extensor digitorum longus, flexor digitorum longus and tibialis anterior muscles bilaterally, more notable on the left. This could be due to myositis or related to atrophy. Soft tissues Cutaneous and subcutaneous edema is seen bilaterally and worse on the left. Edematous change is progressive inferiorly. The appearance is mildly improved compared to the prior MRI. No focal fluid collection is identified. IMPRESSION: Bilateral lower leg cutaneous and subcutaneous edema is worse on the left and appears slightly improved compared to the prior study. Asymmetry is most in keeping with cellulitis. No abscess or osteomyelitis. Mild edema in the extensor digitorum longus, flexor digitorum longus and tibialis anterior muscles bilaterally  appears slightly worse on the left and could be related to mild atrophy or myositis. Small amount of fluid about the posteromedial tendons of the ankles bilaterally is compatible with tenosynovitis without tendon tear. Electronically Signed   By: Inge Rise M.D.   On: 09/13/2015 08:36   Mr Ankle Left  Wo Contrast  Result Date: 09/13/2015 CLINICAL DATA:  Recurrent left lower leg and ankle pain and swelling. Question cellulitis. Diabetic patient. No known injury. EXAM: MRI OF LOWER LEFT EXTREMITY WITHOUT CONTRAST; MRI OF THE LEFT ANKLE WITHOUT CONTRAST TECHNIQUE: Multiplanar, multisequence MR imaging of the left lower leg and ankle was performed. No intravenous contrast was administered. COMPARISON:  MRI left lower leg 08/14/2015. FINDINGS: Bones/Joint/Cartilage The axial and coronal sequences incidentally include the right lower leg. There is no bone marrow signal abnormality to suggest osteomyelitis. No fracture or stress change is  seen. No worrisome marrow lesion is identified. Ligaments Intact. Muscles and Tendons A small amount of fluid is seen about the posteromedial tendons of the ankles bilaterally suggestive of tenosynovitis. No tendon tear is identified. Musculature of the lower leg demonstrates some fatty atrophy which appears symmetric from right to left. No intramuscular fluid collection is identified. Mild edema is seen in the extensor digitorum longus, flexor digitorum longus and tibialis anterior muscles bilaterally, more notable on the left. This could be due to myositis or related to atrophy. Soft tissues Cutaneous and subcutaneous edema is seen bilaterally and worse on the left. Edematous change is progressive inferiorly. The appearance is mildly improved compared to the prior MRI. No focal fluid collection is identified. IMPRESSION: Bilateral lower leg cutaneous and subcutaneous edema is worse on the left and appears slightly improved compared to the prior study. Asymmetry is most in  keeping with cellulitis. No abscess or osteomyelitis. Mild edema in the extensor digitorum longus, flexor digitorum longus and tibialis anterior muscles bilaterally appears slightly worse on the left and could be related to mild atrophy or myositis. Small amount of fluid about the posteromedial tendons of the ankles bilaterally is compatible with tenosynovitis without tendon tear. Electronically Signed   By: Inge Rise M.D.   On: 09/13/2015 08:36     Assessment/Plan: Cellulitis (+/- gout) Total days of antibiotics: 4 vanco    Would continue vanco Overall better but will be a slow course.  Keep leg elevated       Bobby Rumpf Infectious Diseases (pager) [phone] [email or web address] 09/14/2015, 4:11 PM  LOS: 4 days

## 2015-09-14 NOTE — Progress Notes (Signed)
Progress Note    HAINES MORALES  [id number] DOB: 09/06/1922  DOA: 09/09/2015 PCP: Binnie Rail, MD    Brief Narrative:   Chief complaint: Follow-up left leg pain and swelling.  VERNELL GATES is an 82 y.o. male with a PMH of severe AS--> S/P Aortic valve replacement surgery at [facility] on 01/23/2011 (porcine valve); S/P transcatheter aortic valve replacement [place] 05/14/2014 (bovine valve) , chronic diastolic CHF, PAF not on anticoagulation, bradycardia, QT prolongation, CKD (chronic kidney disease), stage III, Lymphoproliferative DO, throat cancer , severe pancytopenia followed by Dr. Benay Spice, recurrent cellulitis of left leg with history of sepsis/septic shock. His recent flare was treated with doxycycline, but due to failure of outpatient therapy he was referred for evaluation given his high risk of deterioration secondary to pancytopenia.  Assessment/Plan:   Principal Problem:   Left leg cellulitis/Recurrent cellulitis of lower leg in the setting of a lymphoproliferative active disorder and pancytopenia Being followed by ID who recommends ongoing treatment with vancomycin. Blood cultures negative to date. MRI negative for abscess or osteomyelitis.  Active Problems:   Hyperkalemia/metabolic acidosis New problems. Repeat chemistries in the morning. Creatinine slightly up today, the patient is mildly acidotic.    Type 2 diabetes mellitus with complication (HCC) Diabetes currently uncontrolled, likely secondary to steroids. CBGs [id number]. Currently being managed with moderate scale SSI QAC/HS. we'll add Levemir 10 units daily.    Gout Continue steroids. Continue oxycodone for pain control.    Essential hypertension Continue propranolol.    Other pancytopenia (HCC)/lymphoproliferative disorder/anemia due to bone marrow failure Continue to monitor blood counts, which have remained stable.    Hypothyroidism Continue Synthroid.    Paroxysmal atrial fibrillation  (HCC) Continue Pacerone.    CKD (chronic kidney disease), stage III Continue to monitor creatinine. Baseline creatinine 1.6-1.9. Current creatinine consistent with usual baseline values.    Depression Continue Zoloft.    S/P TAVR (transcatheter aortic valve replacement)    GERD (gastroesophageal reflux disease) No complaints of heartburn.    Chronic diastolic congestive heart failure (HCC) No complaints of shortness of breath.  Family Communication/Anticipated D/C date and plan/Code Status   DVT prophylaxis: SCDs given thrombocytopenia. Code Status: Limited Code.  Family Communication: No family currently at bedside. Declines my offer to call. Disposition Plan: Home in 2-3 days depending on resolution of cellulitis.   Medical Consultants:    Infectious Disease   Procedures:    None  Anti-Infectives:    Vancomycin 09/09/15--->  Subjective:   The patient reports that he has had ongoing left leg and ankle pain which is alleviated with Tylenol and worsens with standing or movement. He describes the pain as sharp and rated 6/10 when standing. Review of symptoms is positive for one episode of diarrhea this morning and negative for fever, chills, nausea and vomiting.  Objective:    Vitals:   09/13/15 0613 09/13/15 1223 09/13/15 2109 09/14/15 0509  BP: 113/66 116/67 (!) 105/59 (!) 95/52  Pulse: 85 77 97 92  Resp: 16 16 19 16   Temp: 98.6 F (37 C) 98 F (36.7 C) 98.1 F (36.7 C) 97.7 F (36.5 C)  TempSrc: Oral Oral Oral Oral  SpO2: 99% 100% 98% 97%  Weight:      Height:        Intake/Output Summary (Last 24 hours) at 09/14/15 0807 Last data filed at 09/13/15 1700  Gross per 24 hour  Intake  780 ml  Output              500 ml  Net              280 ml   Filed Weights   09/09/15 1042 09/10/15 0513 09/11/15 0800  Weight: 78.5 kg (173 lb) 78.4 kg (172 lb 13.5 oz) 78.4 kg (172 lb 14.4 oz)    Exam: General exam: Appears calm and comfortable.  Sitting up in chair. Respiratory system: Clear to auscultation. Respiratory effort normal. Cardiovascular system: S1 & S2 heard, RRR. No JVD,  rubs, gallops or clicks. No murmurs. Gastrointestinal system: Abdomen is firm, mildly distended and nontender. No organomegaly or masses felt. Normal bowel sounds heard. Central nervous system: Alert and oriented. No focal neurological deficits. Extremities: Left leg swollen from the knee to the toes, erythematous. Skin: Extensive ecchymosis on all extremities.[name] Kitchen Psychiatry: Judgement and insight appear normal. Mood & affect appropriate.   Data Reviewed:   I have personally reviewed following labs and imaging studies:  Labs: Basic Metabolic Panel:  Recent Labs Lab 09/10/15 0240 09/11/15 0322 09/12/15 0156 09/13/15 0453 09/14/15 0549  NA 137 138 137 139 136  K 4.1 3.9 4.4 4.6 5.3*  CL 106 108 110 113* 110  CO2 21* 24 24 21* 17*  GLUCOSE 118* 169* 153* 158* 263*  BUN 62* 67* 59* 50* 52*  CREATININE 1.86* 1.94* 1.72* 1.40* 1.61*  CALCIUM 8.8* 8.7* 8.3* 8.6* 8.8*  MG  --  2.0 2.0  --   --    GFR Estimated Creatinine Clearance: 30.9 mL/min (by C-G formula based on SCr of 1.61 mg/dL). Liver Function Tests:  Recent Labs Lab 09/11/15 0322 09/12/15 0156 09/13/15 0453 09/14/15 0549  AST 26 28 29  37  ALT 25 26 29  35  ALKPHOS 77 82 96 116  BILITOT 1.1 0.9 1.0 1.0  PROT 5.1* 4.9* 5.2* 5.0*  ALBUMIN 2.9* 2.8* 2.8* 2.8*   CBC:  Recent Labs Lab 09/10/15 0240 09/11/15 0322 09/12/15 0156 09/12/15 1011 09/13/15 0453 09/14/15 0549  WBC 2.3* 1.8* 1.9*  --  2.0* 2.3*  NEUTROABS 1.7 1.2* 1.3* 1.9 1.6* 2.1  HGB 9.3* 8.8* 8.7*  --  8.8* 8.7*  HCT 28.8* 26.8* 27.2*  --  28.2* 27.8*  MCV 88.3 88.7 90.4  --  91.0 89.7  PLT 27* 22* 20*  --  22* 29*   CBG:  Recent Labs Lab 09/13/15 0618 09/13/15 1102 09/13/15 1614 09/13/15 2136 09/14/15 0651  GLUCAP 159* 183* 112* 242* 222*   Sepsis Labs:  Recent Labs Lab 09/09/15 1204   09/11/15 0322 09/12/15 0156 09/13/15 0453 09/14/15 0549  WBC  --   < > 1.8* 1.9* 2.0* 2.3*  LATICACIDVEN 1.52  --  1.4  --   --   --   < > = values in this interval not displayed.  Microbiology Recent Results (from the past 240 hour(s))  Culture, blood (routine x 2)     Status: None (Preliminary result)   Collection Time: 09/09/15 11:50 AM  Result Value Ref Range Status   Specimen Description BLOOD RIGHT ANTECUBITAL  Final   Special Requests BOTTLES DRAWN AEROBIC AND ANAEROBIC 5CC  Final   Culture NO GROWTH 4 DAYS  Final   Report Status PENDING  Incomplete  Culture, blood (routine x 2)     Status: None (Preliminary result)   Collection Time: 09/09/15 12:09 PM  Result Value Ref Range Status   Specimen Description BLOOD RIGHT FOREARM  Final  Special Requests BOTTLES DRAWN AEROBIC AND ANAEROBIC 5CC  Final   Culture NO GROWTH 4 DAYS  Final   Report Status PENDING  Incomplete  Urine culture     Status: Abnormal   Collection Time: 09/09/15 12:49 PM  Result Value Ref Range Status   Specimen Description URINE, CLEAN CATCH  Final   Special Requests Normal  Final   Culture MULTIPLE SPECIES PRESENT, SUGGEST RECOLLECTION (A)  Final   Report Status 09/10/2015 FINAL  Final  Culture, Urine     Status: Abnormal   Collection Time: 09/10/15  1:10 PM  Result Value Ref Range Status   Specimen Description URINE, RANDOM  Final   Special Requests NONE  Final   Culture <10,000 COLONIES/mL INSIGNIFICANT GROWTH (A)  Final   Report Status 09/11/2015 FINAL  Final    Radiology: Mr Tibia Fibula Left Wo Contrast  Result Date: 09/13/2015 CLINICAL DATA:  Recurrent left lower leg and ankle pain and swelling. Question cellulitis. Diabetic patient. No known injury. EXAM: MRI OF LOWER LEFT EXTREMITY WITHOUT CONTRAST; MRI OF THE LEFT ANKLE WITHOUT CONTRAST TECHNIQUE: Multiplanar, multisequence MR imaging of the left lower leg and ankle was performed. No intravenous contrast was administered. COMPARISON:  MRI  left lower leg 08/14/2015. FINDINGS: Bones/Joint/Cartilage The axial and coronal sequences incidentally include the right lower leg. There is no bone marrow signal abnormality to suggest osteomyelitis. No fracture or stress change is seen. No worrisome marrow lesion is identified. Ligaments Intact. Muscles and Tendons A small amount of fluid is seen about the posteromedial tendons of the ankles bilaterally suggestive of tenosynovitis. No tendon tear is identified. Musculature of the lower leg demonstrates some fatty atrophy which appears symmetric from right to left. No intramuscular fluid collection is identified. Mild edema is seen in the extensor digitorum longus, flexor digitorum longus and tibialis anterior muscles bilaterally, more notable on the left. This could be due to myositis or related to atrophy. Soft tissues Cutaneous and subcutaneous edema is seen bilaterally and worse on the left. Edematous change is progressive inferiorly. The appearance is mildly improved compared to the prior MRI. No focal fluid collection is identified. IMPRESSION: Bilateral lower leg cutaneous and subcutaneous edema is worse on the left and appears slightly improved compared to the prior study. Asymmetry is most in keeping with cellulitis. No abscess or osteomyelitis. Mild edema in the extensor digitorum longus, flexor digitorum longus and tibialis anterior muscles bilaterally appears slightly worse on the left and could be related to mild atrophy or myositis. Small amount of fluid about the posteromedial tendons of the ankles bilaterally is compatible with tenosynovitis without tendon tear. Electronically Signed   By: Inge Rise M.D.   On: 09/13/2015 08:36   Mr Ankle Left  Wo Contrast  Result Date: 09/13/2015 CLINICAL DATA:  Recurrent left lower leg and ankle pain and swelling. Question cellulitis. Diabetic patient. No known injury. EXAM: MRI OF LOWER LEFT EXTREMITY WITHOUT CONTRAST; MRI OF THE LEFT ANKLE WITHOUT  CONTRAST TECHNIQUE: Multiplanar, multisequence MR imaging of the left lower leg and ankle was performed. No intravenous contrast was administered. COMPARISON:  MRI left lower leg 08/14/2015. FINDINGS: Bones/Joint/Cartilage The axial and coronal sequences incidentally include the right lower leg. There is no bone marrow signal abnormality to suggest osteomyelitis. No fracture or stress change is seen. No worrisome marrow lesion is identified. Ligaments Intact. Muscles and Tendons A small amount of fluid is seen about the posteromedial tendons of the ankles bilaterally suggestive of tenosynovitis. No tendon tear is identified.  Musculature of the lower leg demonstrates some fatty atrophy which appears symmetric from right to left. No intramuscular fluid collection is identified. Mild edema is seen in the extensor digitorum longus, flexor digitorum longus and tibialis anterior muscles bilaterally, more notable on the left. This could be due to myositis or related to atrophy. Soft tissues Cutaneous and subcutaneous edema is seen bilaterally and worse on the left. Edematous change is progressive inferiorly. The appearance is mildly improved compared to the prior MRI. No focal fluid collection is identified. IMPRESSION: Bilateral lower leg cutaneous and subcutaneous edema is worse on the left and appears slightly improved compared to the prior study. Asymmetry is most in keeping with cellulitis. No abscess or osteomyelitis. Mild edema in the extensor digitorum longus, flexor digitorum longus and tibialis anterior muscles bilaterally appears slightly worse on the left and could be related to mild atrophy or myositis. Small amount of fluid about the posteromedial tendons of the ankles bilaterally is compatible with tenosynovitis without tendon tear. Electronically Signed   By: Inge Rise M.D.   On: 09/13/2015 08:36    Medications:   . amiodarone  200 mg Oral Daily  . insulin aspart  0-15 Units Subcutaneous TID WC   . insulin aspart  0-5 Units Subcutaneous QHS  . levothyroxine  25 mcg Oral QAC breakfast  . methylPREDNISolone (SOLU-MEDROL) injection  40 mg Intravenous BID  . polyethylene glycol  17 g Oral Daily  . propranolol  5 mg Oral BID  . sertraline  50 mg Oral QHS  . vancomycin  750 mg Intravenous Q24H   Continuous Infusions:   Medical decision making is of high complexity and this patient is at high risk of deterioration, therefore this is a level 3 visit. He has multiple chronic conditions, an exacerbation of a chronic condition, and 2 new problems today including hyperkalemia and metabolic acidosis which will require further follow-up.   LOS: 4 days   [facility] Pager [phone]. If unable to reach me by pager, please call my cell phone at [phone].  *Please refer to [email or web address], password [email or web address] to get updated schedule on who will round on this patient, as hospitalists switch teams weekly. If 7PM-7AM, please contact night-coverage at [email or web address], password [email or web address] for any overnight needs.  09/14/2015, 8:07 AM

## 2015-09-14 NOTE — Progress Notes (Signed)
Inpatient Diabetes Program Recommendations  AACE/ADA: New Consensus Statement on Inpatient Glycemic Control (2015)  Target Ranges:  Prepandial:   less than 140 mg/dL      Peak postprandial:   less than 180 mg/dL (1-2 hours)      Critically ill patients:  140 - 180 mg/dL   Lab Results  Component Value Date   GLUCAP 299 (H) 09/14/2015   HGBA1C 5.7 05/09/2015    Review of Glycemic Control:  Results for HAZE, FERRUFINO (MRN [id number]) as of 09/14/2015 13:29  Ref. Range 09/13/2015 21:36 09/14/2015 06:51 09/14/2015 11:14  Glucose-Capillary Latest Ref Range: 65 - 99 mg/dL 242 (H) 222 (H) 299 (H)    Diabetes history: Borderline diabetes Outpatient Diabetes medications: None Current orders for Inpatient glycemic control:  Novolog moderate tid with meals and HS, Solumedrol 40 mg IV q 12 hours  Inpatient Diabetes Program Recommendations:    If appropriate, please consider adding Levemir 10 units daily while patient is on IV steroids.  Thanks, Adah Perl, RN, BC-ADM Inpatient Diabetes Coordinator Pager [phone] ([id number])

## 2015-09-15 ENCOUNTER — Other Ambulatory Visit: Payer: Self-pay

## 2015-09-15 LAB — CBC WITH DIFFERENTIAL/PLATELET
BASOS PCT: 0 %
Basophils Absolute: 0 10*3/uL (ref 0.0–0.1)
EOS ABS: 0 10*3/uL (ref 0.0–0.7)
Eosinophils Relative: 0 %
HEMATOCRIT: 28.6 % — AB (ref 39.0–52.0)
HEMOGLOBIN: 8.9 g/dL — AB (ref 13.0–17.0)
LYMPHS ABS: 0.2 10*3/uL — AB (ref 0.7–4.0)
Lymphocytes Relative: 6 %
MCH: 27.7 pg (ref 26.0–34.0)
MCHC: 31.1 g/dL (ref 30.0–36.0)
MCV: 89.1 fL (ref 78.0–100.0)
MONO ABS: 0.1 10*3/uL (ref 0.1–1.0)
MONOS PCT: 3 %
NEUTROS PCT: 91 %
Neutro Abs: 3.2 10*3/uL (ref 1.7–7.7)
Platelets: 30 10*3/uL — ABNORMAL LOW (ref 150–400)
RBC: 3.21 MIL/uL — ABNORMAL LOW (ref 4.22–5.81)
RDW: 16.2 % — AB (ref 11.5–15.5)
WBC: 3.5 10*3/uL — ABNORMAL LOW (ref 4.0–10.5)

## 2015-09-15 LAB — GLUCOSE, CAPILLARY
GLUCOSE-CAPILLARY: 236 mg/dL — AB (ref 65–99)
Glucose-Capillary: 222 mg/dL — ABNORMAL HIGH (ref 65–99)
Glucose-Capillary: 201 mg/dL — ABNORMAL HIGH (ref 65–99)
Glucose-Capillary: 153 mg/dL — ABNORMAL HIGH (ref 65–99)
Glucose-Capillary: 215 mg/dL — ABNORMAL HIGH (ref 65–99)

## 2015-09-15 LAB — BASIC METABOLIC PANEL
Anion gap: 8 (ref 5–15)
BUN: 66 mg/dL — AB (ref 6–20)
CALCIUM: 9.1 mg/dL (ref 8.9–10.3)
CHLORIDE: 109 mmol/L (ref 101–111)
CO2: 19 mmol/L — AB (ref 22–32)
CREATININE: 1.72 mg/dL — AB (ref 0.61–1.24)
GFR calc non Af Amer: 33 mL/min — ABNORMAL LOW (ref >60)
GFR, EST AFRICAN AMERICAN: 38 mL/min — AB (ref >60)
Glucose, Bld: 221 mg/dL — ABNORMAL HIGH (ref 65–99)
Potassium: 5.5 mmol/L — ABNORMAL HIGH (ref 3.5–5.1)
SODIUM: 136 mmol/L (ref 135–145)

## 2015-09-15 LAB — LACTIC ACID, PLASMA: Lactic Acid, Venous: 2.5 mmol/L (ref 0.5–1.9)

## 2015-09-15 MED ORDER — SODIUM CHLORIDE 0.9 % IV SOLN
Freq: Once | INTRAVENOUS | Status: AC
  Administered 2015-09-15: 10:00:00 via INTRAVENOUS

## 2015-09-15 MED ORDER — DEXTROSE 5 % IV SOLN
1.0000 g | INTRAVENOUS | Status: AC
  Administered 2015-09-15 – 2015-09-21 (×7): 1 g via INTRAVENOUS
  Filled 2015-09-15 (×7): qty 1

## 2015-09-15 MED ORDER — METHYLPREDNISOLONE SODIUM SUCC 125 MG IJ SOLR
40.0000 mg | Freq: Two times a day (BID) | INTRAMUSCULAR | Status: DC
  Administered 2015-09-15 – 2015-09-17 (×6): 40 mg via INTRAVENOUS
  Filled 2015-09-15 (×6): qty 2

## 2015-09-15 MED ORDER — INSULIN DETEMIR 100 UNIT/ML ~~LOC~~ SOLN
20.0000 [IU] | Freq: Every day | SUBCUTANEOUS | Status: DC
  Administered 2015-09-16: 20 [IU] via SUBCUTANEOUS
  Filled 2015-09-15 (×3): qty 0.2

## 2015-09-15 MED ORDER — METHYLPREDNISOLONE SODIUM SUCC 125 MG IJ SOLR: 40.0000 mg | Freq: Two times a day (BID) | INTRAMUSCULAR | Status: DC

## 2015-09-15 MED ORDER — SODIUM POLYSTYRENE SULFONATE 15 GM/60ML PO SUSP
15.0000 g | Freq: Once | ORAL | Status: AC
  Administered 2015-09-15: 15 g via ORAL
  Filled 2015-09-15: qty 60

## 2015-09-15 NOTE — Care Management (Addendum)
Should patient require Home Health at discharge for disease management or wound care he will be setup with [facility] as a [place] patient, per Dr. Reynaldo Minium. [name] Kitchen

## 2015-09-15 NOTE — Progress Notes (Signed)
Progress Note    [name]  [id number] DOB: 1922-12-10  DOA: 09/09/2015 PCP: Binnie Rail, MD    Brief Narrative:   Chief complaint: Follow-up left leg pain and swelling.  [name] is an 82 y.o. male with a PMH of severe AS--> S/P Aortic valve replacement surgery at [facility] on 01/23/2011 (porcine valve); S/P transcatheter aortic valve replacement [place] 05/14/2014 (bovine valve) , chronic diastolic CHF, PAF not on anticoagulation, bradycardia, QT prolongation, CKD (chronic kidney disease), stage III, Lymphoproliferative DO, throat cancer , severe pancytopenia followed by Dr. Benay Spice, recurrent cellulitis of left leg with history of sepsis/septic shock. His recent flare was treated with doxycycline, but due to failure of outpatient therapy he was referred for evaluation given his high risk of deterioration secondary to pancytopenia.  Assessment/Plan:   Principal Problem:   Left leg cellulitis/Recurrent cellulitis of lower leg in the setting of a lymphoproliferative active disorder and pancytopenia Being followed by ID who recommends ongoing treatment with vancomycin. Blood cultures negative to date. MRI negative for abscess or osteomyelitis. Lactic acid up today.  Will give 1 liter bolus.  Hemodynamically stable.  Active Problems:   Hyperkalemia/metabolic acidosis Repeat chemistries show persistent hyperkalemia/metabolic acidosis. Will give 15 g of Kayexalate. Serum lactate checked, and is up.  Will give 1 liter bolus.    Type 2 diabetes mellitus with complication (HCC) Diabetes currently uncontrolled, likely secondary to steroids.  Currently being managed with Levemir 10 units daily and moderate scale SSI QAC/HS. [place] 201-299. Increase Levemir to 20 units.    Gout Continue steroids. Continue oxycodone for pain control.    Essential hypertension Continue propranolol.    Other pancytopenia (HCC)/lymphoproliferative disorder/anemia due to bone marrow  failure Continue to monitor blood counts, which have remained stable.    Hypothyroidism Continue Synthroid.    Paroxysmal atrial fibrillation (HCC) Continue Pacerone.    CKD (chronic kidney disease), stage III Continue to monitor creatinine. Baseline creatinine 1.6-1.9. Current creatinine consistent with usual baseline values.    Depression Continue Zoloft.    S/P TAVR (transcatheter aortic valve replacement)    GERD (gastroesophageal reflux disease) No complaints of heartburn.    Chronic diastolic congestive heart failure (HCC) No complaints of shortness of breath.  Family Communication/Anticipated D/C date and plan/Code Status   DVT prophylaxis: SCDs given thrombocytopenia. Code Status: Limited Code.  Family Communication: No family currently at bedside. Declines my offer to call. Disposition Plan: Home in 2-3 days depending on resolution of cellulitis.   Medical Consultants:    Infectious Disease   Procedures:    None  Anti-Infectives:    Vancomycin 09/09/15--->  Subjective:   The patient reports that he has had ongoing left leg and ankle pain which is alleviated with Tylenol and worsens with standing or movement. He describes the pain as sharp and rated 6/10 when standing. Review of symptoms is positive for one episode of diarrhea this morning and negative for fever, chills, nausea and vomiting.  Objective:    Vitals:   09/14/15 0509 09/14/15 1407 09/14/15 1900 09/15/15 0501  BP: (!) 95/52 131/77 (!) 148/77 (!) 156/67  Pulse: 92 82 80 83  Resp: 16 16 16 16   Temp: 97.7 F (36.5 C) 97.6 F (36.4 C) 98.1 F (36.7 C) 97.7 F (36.5 C)  TempSrc: Oral   Oral  SpO2: 97% 98% 100% 97%  Weight:      Height:        Intake/Output Summary (Last 24 hours) at  09/15/15 0829 Last data filed at 09/14/15 1800  Gross per 24 hour  Intake              870 ml  Output              450 ml  Net              420 ml   Filed Weights   09/09/15 1042 09/10/15 0513  09/11/15 0800  Weight: 78.5 kg (173 lb) 78.4 kg (172 lb 13.5 oz) 78.4 kg (172 lb 14.4 oz)    Exam: General exam: Appears calm and comfortable. Sitting up in chair. Respiratory system: Clear to auscultation. Respiratory effort normal. Cardiovascular system: S1 & S2 heard, HSIR. No JVD,  rubs, gallops or clicks. No murmurs. Gastrointestinal system: Abdomen is soft and nontender. No organomegaly or masses felt. Normal bowel sounds heard. Central nervous system: Alert and oriented. No focal neurological deficits. Extremities: Left leg swollen from the knee to the toes, erythematous. Pictured below. Skin: Extensive ecchymosis on all extremities.[name] Kitchen Psychiatry: Judgement and insight appear normal. Mood & affect appropriate.      Data Reviewed:   I have personally reviewed following labs and imaging studies:  Labs: Basic Metabolic Panel:  Recent Labs Lab 09/11/15 0322 09/12/15 0156 09/13/15 0453 09/14/15 0549 09/15/15 0419  NA 138 137 139 136 136  K 3.9 4.4 4.6 5.3* 5.5*  CL 108 110 113* 110 109  CO2 24 24 21* 17* 19*  GLUCOSE 169* 153* 158* 263* 221*  BUN 67* 59* 50* 52* 66*  CREATININE 1.94* 1.72* 1.40* 1.61* 1.72*  CALCIUM 8.7* 8.3* 8.6* 8.8* 9.1  MG 2.0 2.0  --   --   --    GFR Estimated Creatinine Clearance: 28.9 mL/min (by C-G formula based on SCr of 1.72 mg/dL). Liver Function Tests:  Recent Labs Lab 09/11/15 0322 09/12/15 0156 09/13/15 0453 09/14/15 0549  AST 26 28 29  37  ALT 25 26 29  35  ALKPHOS 77 82 96 116  BILITOT 1.1 0.9 1.0 1.0  PROT 5.1* 4.9* 5.2* 5.0*  ALBUMIN 2.9* 2.8* 2.8* 2.8*   CBC:  Recent Labs Lab 09/11/15 0322 09/12/15 0156 09/12/15 1011 09/13/15 0453 09/14/15 0549 09/15/15 0419  WBC 1.8* 1.9*  --  2.0* 2.3* 3.5*  NEUTROABS 1.2* 1.3* 1.9 1.6* 2.1 3.2  HGB 8.8* 8.7*  --  8.8* 8.7* 8.9*  HCT 26.8* 27.2*  --  28.2* 27.8* 28.6*  MCV 88.7 90.4  --  91.0 89.7 89.1  PLT 22* 20*  --  22* 29* 30*   CBG:  Recent Labs Lab 09/14/15 1114  09/14/15 1633 09/14/15 2105 09/15/15 0505 09/15/15 0644  GLUCAP 299* 251* 270* 222* 201*   Sepsis Labs:  Recent Labs Lab 09/09/15 1204  09/11/15 0322 09/12/15 0156 09/13/15 0453 09/14/15 0549 09/15/15 0419  WBC  --   < > 1.8* 1.9* 2.0* 2.3* 3.5*  LATICACIDVEN 1.52  --  1.4  --   --   --   --   < > = values in this interval not displayed.  Microbiology Recent Results (from the past 240 hour(s))  Culture, blood (routine x 2)     Status: None   Collection Time: 09/09/15 11:50 AM  Result Value Ref Range Status   Specimen Description BLOOD RIGHT ANTECUBITAL  Final   Special Requests BOTTLES DRAWN AEROBIC AND ANAEROBIC 5CC  Final   Culture NO GROWTH 5 DAYS  Final   Report Status 09/14/2015 FINAL  Final  Culture, blood (routine x 2)     Status: None   Collection Time: 09/09/15 12:09 PM  Result Value Ref Range Status   Specimen Description BLOOD RIGHT FOREARM  Final   Special Requests BOTTLES DRAWN AEROBIC AND ANAEROBIC 5CC  Final   Culture NO GROWTH 5 DAYS  Final   Report Status 09/14/2015 FINAL  Final  Urine culture     Status: Abnormal   Collection Time: 09/09/15 12:49 PM  Result Value Ref Range Status   Specimen Description URINE, CLEAN CATCH  Final   Special Requests Normal  Final   Culture MULTIPLE SPECIES PRESENT, SUGGEST RECOLLECTION (A)  Final   Report Status 09/10/2015 FINAL  Final  Culture, Urine     Status: Abnormal   Collection Time: 09/10/15  1:10 PM  Result Value Ref Range Status   Specimen Description URINE, RANDOM  Final   Special Requests NONE  Final   Culture <10,000 COLONIES/mL INSIGNIFICANT GROWTH (A)  Final   Report Status 09/11/2015 FINAL  Final    Radiology: No results found.  Medications:   . amiodarone  200 mg Oral Daily  . insulin aspart  0-15 Units Subcutaneous TID WC  . insulin aspart  0-5 Units Subcutaneous QHS  . insulin detemir  10 Units Subcutaneous Daily  . levothyroxine  25 mcg Oral QAC breakfast  . polyethylene glycol  17 g  Oral Daily  . propranolol  5 mg Oral BID  . sertraline  50 mg Oral QHS  . vancomycin  750 mg Intravenous Q24H   Continuous Infusions:   Medical decision making is of high complexity and this patient is at high risk of deterioration, therefore this is a level 3 visit. He has multiple chronic conditions, an exacerbation of a chronic condition, and the 2 new problems identified yesterday including hyperkalemia and metabolic acidosis require further follow-up and treatment.   LOS: 5 days   [facility] Pager [phone]. If unable to reach me by pager, please call my cell phone at [phone].  *Please refer to [email or web address], password [email or web address] to get updated schedule on who will round on this patient, as hospitalists switch teams weekly. If 7PM-7AM, please contact night-coverage at [email or web address], password [email or web address] for any overnight needs.  09/15/2015, 8:29 AM

## 2015-09-15 NOTE — Plan of Care (Signed)
Problem: Tissue Perfusion: Goal: Risk factors for ineffective tissue perfusion will decrease Outcome: Progressing Elevating extremity ; steroids as ordered.

## 2015-09-15 NOTE — Progress Notes (Signed)
Critical lab value lactic acid messaged to MD, Dr. Rockne Menghini with new orders to begin bolus normal saline 1 liter over 2 hours.

## 2015-09-15 NOTE — Progress Notes (Signed)
INFECTIOUS DISEASE PROGRESS NOTE  ID: [name] is a 82 y.o. male with  Principal Problem:   Left leg cellulitis Active Problems:   Type 2 diabetes mellitus with complication (HCC)   Gout   Essential hypertension   Other pancytopenia (HCC)   Hypothyroidism   Paroxysmal atrial fibrillation (HCC)   CKD (chronic kidney disease), stage III   Depression   S/P TAVR (transcatheter aortic valve replacement)   GERD (gastroesophageal reflux disease)   Chronic diastolic congestive heart failure (HCC)   Lymphoproliferative disorder (HCC)   Recurrent cellulitis of lower leg   Anemia due to bone marrow failure (HCC)   Metabolic acidosis   Hyperkalemia  Subjective: Shooting pain in ankle.   Abtx:  Anti-infectives    Start     Dose/Rate Route Frequency Ordered Stop   09/10/15 1300  vancomycin (VANCOCIN) IVPB 750 mg/150 ml premix     750 mg 150 mL/hr over 60 Minutes Intravenous Every 24 hours 09/09/15 1239     09/09/15 1200  vancomycin (VANCOCIN) 1,500 mg in sodium chloride 0.9 % 500 mL IVPB     1,500 mg 250 mL/hr over 120 Minutes Intravenous  Once 09/09/15 1157 09/09/15 1432   09/09/15 1145  vancomycin (VANCOCIN) IVPB 1000 mg/200 mL premix  Status:  Discontinued     1,000 mg 200 mL/hr over 60 Minutes Intravenous  Once 09/09/15 1142 09/09/15 1142      Medications:  Scheduled: . sodium chloride   Intravenous Once  . amiodarone  200 mg Oral Daily  . insulin aspart  0-15 Units Subcutaneous TID WC  . insulin aspart  0-5 Units Subcutaneous QHS  . [START ON 09/16/2015] insulin detemir  20 Units Subcutaneous Daily  . levothyroxine  25 mcg Oral QAC breakfast  . methylPREDNISolone (SOLU-MEDROL) injection  40 mg Intravenous Q12H  . polyethylene glycol  17 g Oral Daily  . propranolol  5 mg Oral BID  . sertraline  50 mg Oral QHS  . vancomycin  750 mg Intravenous Q24H    Objective: Vital signs in last 24 hours: Temp:  [97.6 F (36.4 C)-98.1 F (36.7 C)] 97.7 F (36.5 C) (09/07  0501) Pulse Rate:  [80-83] 83 (09/07 0501) Resp:  [16] 16 (09/07 0501) BP: (131-156)/(67-77) 156/67 (09/07 0501) SpO2:  [97 %-100 %] 97 % (09/07 0501)   General appearance: alert, cooperative and no distress Resp: clear to auscultation bilaterally Cardio: regular rate and rhythm GI: normal findings: bowel sounds normal and soft, non-tender Extremities: decreased edema in LLE. heat unchanged. mild tenderness with palpation of ankle.   Lab Results  Recent Labs  09/14/15 0549 09/15/15 0419  WBC 2.3* 3.5*  HGB 8.7* 8.9*  HCT 27.8* 28.6*  NA 136 136  K 5.3* 5.5*  CL 110 109  CO2 17* 19*  BUN 52* 66*  CREATININE 1.61* 1.72*   Liver Panel  Recent Labs  09/13/15 0453 09/14/15 0549  PROT 5.2* 5.0*  ALBUMIN 2.8* 2.8*  AST 29 37  ALT 29 35  ALKPHOS 96 116  BILITOT 1.0 1.0   Sedimentation Rate No results for input(s): ESRSEDRATE in the last 72 hours. C-Reactive Protein No results for input(s): CRP in the last 72 hours.  Microbiology: Recent Results (from the past 240 hour(s))  Culture, blood (routine x 2)     Status: None   Collection Time: 09/09/15 11:50 AM  Result Value Ref Range Status   Specimen Description BLOOD RIGHT ANTECUBITAL  Final   Special Requests BOTTLES DRAWN  AEROBIC AND ANAEROBIC 5CC  Final   Culture NO GROWTH 5 DAYS  Final   Report Status 09/14/2015 FINAL  Final  Culture, blood (routine x 2)     Status: None   Collection Time: 09/09/15 12:09 PM  Result Value Ref Range Status   Specimen Description BLOOD RIGHT FOREARM  Final   Special Requests BOTTLES DRAWN AEROBIC AND ANAEROBIC 5CC  Final   Culture NO GROWTH 5 DAYS  Final   Report Status 09/14/2015 FINAL  Final  Urine culture     Status: Abnormal   Collection Time: 09/09/15 12:49 PM  Result Value Ref Range Status   Specimen Description URINE, CLEAN CATCH  Final   Special Requests Normal  Final   Culture MULTIPLE SPECIES PRESENT, SUGGEST RECOLLECTION (A)  Final   Report Status 09/10/2015 FINAL   Final  Culture, Urine     Status: Abnormal   Collection Time: 09/10/15  1:10 PM  Result Value Ref Range Status   Specimen Description URINE, RANDOM  Final   Special Requests NONE  Final   Culture <10,000 COLONIES/mL INSIGNIFICANT GROWTH (A)  Final   Report Status 09/11/2015 FINAL  Final    Studies/Results: No results found.   Assessment/Plan: Cellulitis (+/- gout)  Total days of antibiotics: 5 vanco  Lactic acid increased, hypotension unchanged. WBC low (as prev), Cr stable.  Will broaden his anbx (cefepime) but still not clear that this is infectious If he continues to do poorly, would consider repeat imaging.  His MRI describes bilateral cellulitis however a bilateral identical infection is quite unusual.          Bobby Rumpf Infectious Diseases (pager) [phone] [email or web address] 09/15/2015, 12:12 PM  LOS: 5 days

## 2015-09-15 NOTE — Patient Outreach (Signed)
Transition of care:   Home visit for 09/16/2015 cancelled as patient is currently admitted.  Plan: will follow up with patient at discharged. Hospital Liaisons following currently.  Tomasa Rand, RN, BSN, CEN [facility] [employer] [phone]

## 2015-09-16 ENCOUNTER — Ambulatory Visit: Payer: Self-pay

## 2015-09-16 DIAGNOSIS — D61818 Other pancytopenia: Secondary | ICD-10-CM

## 2015-09-16 DIAGNOSIS — I951 Orthostatic hypotension: Secondary | ICD-10-CM

## 2015-09-16 DIAGNOSIS — R740 Nonspecific elevation of levels of transaminase and lactic acid dehydrogenase [LDH]: Secondary | ICD-10-CM

## 2015-09-16 LAB — BASIC METABOLIC PANEL
ANION GAP: 9 (ref 5–15)
BUN: 66 mg/dL — ABNORMAL HIGH (ref 6–20)
CHLORIDE: 109 mmol/L (ref 101–111)
CO2: 18 mmol/L — AB (ref 22–32)
CREATININE: 1.53 mg/dL — AB (ref 0.61–1.24)
Calcium: 8.7 mg/dL — ABNORMAL LOW (ref 8.9–10.3)
GFR calc non Af Amer: 38 mL/min — ABNORMAL LOW (ref >60)
GFR, EST AFRICAN AMERICAN: 44 mL/min — AB (ref >60)
Glucose, Bld: 164 mg/dL — ABNORMAL HIGH (ref 65–99)
Potassium: 5.2 mmol/L — ABNORMAL HIGH (ref 3.5–5.1)
SODIUM: 136 mmol/L (ref 135–145)

## 2015-09-16 LAB — CBC
HCT: 26.5 % — ABNORMAL LOW (ref 39.0–52.0)
HEMOGLOBIN: 8.4 g/dL — AB (ref 13.0–17.0)
MCH: 28.4 pg (ref 26.0–34.0)
MCHC: 31.7 g/dL (ref 30.0–36.0)
MCV: 89.5 fL (ref 78.0–100.0)
PLATELETS: 28 10*3/uL — AB (ref 150–400)
RBC: 2.96 MIL/uL — AB (ref 4.22–5.81)
RDW: 16.5 % — ABNORMAL HIGH (ref 11.5–15.5)
WBC: 2.5 10*3/uL — AB (ref 4.0–10.5)

## 2015-09-16 LAB — GLUCOSE, CAPILLARY
GLUCOSE-CAPILLARY: 152 mg/dL — AB (ref 65–99)
GLUCOSE-CAPILLARY: 148 mg/dL — AB (ref 65–99)
GLUCOSE-CAPILLARY: 155 mg/dL — AB (ref 65–99)
GLUCOSE-CAPILLARY: 248 mg/dL — AB (ref 65–99)

## 2015-09-16 LAB — LACTIC ACID, PLASMA: Lactic Acid, Venous: 2 mmol/L (ref 0.5–1.9)

## 2015-09-16 MED ORDER — SODIUM CHLORIDE 0.9 % IV BOLUS (SEPSIS)
1000.0000 mL | Freq: Once | INTRAVENOUS | Status: AC
  Administered 2015-09-16: 1000 mL via INTRAVENOUS

## 2015-09-16 MED ORDER — TERBINAFINE HCL 1 % EX CREA
TOPICAL_CREAM | Freq: Two times a day (BID) | CUTANEOUS | Status: DC
  Administered 2015-09-16 – 2015-09-23 (×12): via TOPICAL
  Filled 2015-09-16 (×2): qty 12

## 2015-09-16 MED ORDER — SODIUM POLYSTYRENE SULFONATE 15 GM/60ML PO SUSP
15.0000 g | Freq: Once | ORAL | Status: AC
  Administered 2015-09-16: 15 g via ORAL
  Filled 2015-09-16: qty 60

## 2015-09-16 NOTE — Progress Notes (Signed)
Progress Note    [name]  [id number] DOB: [birth year]  DOA: 09/09/2015 PCP: Binnie Rail, MD    Brief Narrative:   Chief complaint: Follow-up left leg pain and swelling.  [name] is an 82 y.o. male with a PMH of severe AS--> S/P Aortic valve replacement surgery at [facility] on 01/23/2011 (porcine valve); S/P transcatheter aortic valve replacement [place] 05/14/2014 (bovine valve) , chronic diastolic CHF, PAF not on anticoagulation, bradycardia, QT prolongation, CKD (chronic kidney disease), stage III, Lymphoproliferative DO, throat cancer , severe pancytopenia followed by Dr. Benay Spice, recurrent cellulitis of left leg with history of sepsis/septic shock. His recent flare was treated with doxycycline, but due to failure of outpatient therapy he was referred for evaluation given his high risk of deterioration secondary to pancytopenia.  Assessment/Plan:   Principal Problem:   Left leg cellulitis/Recurrent cellulitis of lower leg in the setting of a lymphoproliferative active disorder and pancytopenia Being followed by ID who recommends ongoing treatment with vancomycin. Cefepime added 09/15/15. Blood cultures negative to date. MRI negative for abscess or osteomyelitis. Lactic acid remains persistently elevated.  Lamisil cream ordered to bilateral feet for Candida as this is likely serving as the portal of entry.  Active Problems:   Hyperkalemia/metabolic acidosis Repeat chemistries show persistent hyperkalemia/metabolic acidosis. Given 15 g of Kayexalate and a 1 L NS bolus 09/15/15. Repeat lactic acid still elevated, so we'll give another 1 L bolus today. Give an additional 15 g of Kayexalate today.    Type 2 diabetes mellitus with complication (HCC) Diabetes currently uncontrolled, likely secondary to steroids.  Currently being managed with Levemir 20 units daily and moderate scale SSI QAC/HS. CBGs 152-236.    Gout Continue steroids. Continue oxycodone for pain control.   Essential hypertension Continue propranolol.    Other pancytopenia (HCC)/lymphoproliferative disorder/anemia due to bone marrow failure Continue to monitor blood counts, which have remained stable.    Hypothyroidism Continue Synthroid.    Paroxysmal atrial fibrillation (HCC) Continue Pacerone.    CKD (chronic kidney disease), stage III Continue to monitor creatinine. Baseline creatinine 1.6-1.9. Current creatinine consistent with usual baseline values.    Depression Continue Zoloft.    S/P TAVR (transcatheter aortic valve replacement)    GERD (gastroesophageal reflux disease) No complaints of heartburn.    Chronic diastolic congestive heart failure (HCC) No complaints of shortness of breath.  Family Communication/Anticipated D/C date and plan/Code Status   DVT prophylaxis: SCDs given thrombocytopenia. Code Status: Limited Code.  Family Communication: Daughter updated at the bedside. Disposition Plan: Home in 2-3 days depending on resolution of cellulitis, hyperkalemia, and metabolic acidosis.   Medical Consultants:    Infectious Disease   Procedures:    None  Anti-Infectives:    Vancomycin 09/09/15--->  Subjective:   The patient reports that he has had ongoing ankle pain which is alleviated with Tylenol and worsens with standing or movement. He describes the pain as sharp and rated 6/10 when standing, overall improved but still present. Review of symptoms is positive for loose stools and negative for fever, chills, nausea and vomiting.  Objective:    Vitals:   09/15/15 0501 09/15/15 1409 09/15/15 2056 09/16/15 0521  BP: (!) 156/67 126/74 138/75 112/60  Pulse: 83 89 87 86  Resp: 16 18 18 18   Temp: 97.7 F (36.5 C) 98 F (36.7 C) 98.6 F (37 C) 97.7 F (36.5 C)  TempSrc: Oral  Oral Oral  SpO2: 97% 98% 98% 98%  Weight:  Height:        Intake/Output Summary (Last 24 hours) at 09/16/15 0852 Last data filed at 09/15/15 1900  Gross per 24 hour    Intake              240 ml  Output                0 ml  Net              240 ml   Filed Weights   09/09/15 1042 09/10/15 0513 09/11/15 0800  Weight: 78.5 kg (173 lb) 78.4 kg (172 lb 13.5 oz) 78.4 kg (172 lb 14.4 oz)    Exam: General exam: Appears calm and comfortable. Sitting up in chair. Respiratory system: Clear to auscultation. Respiratory effort normal. Cardiovascular system: S1 & S2 heard, HSIR. No JVD,  rubs, gallops or clicks. No murmurs. Gastrointestinal system: Abdomen is soft and nontender. No organomegaly or masses felt. Normal bowel sounds heard. Central nervous system: Alert and oriented. No focal neurological deficits. Extremities: Left leg swollen from the knee to the toes, erythematous. Unchanged in appearance as noted and picture which was taken 09/15/15. Skin: Extensive ecchymosis on all extremities.[name] Kitchen Psychiatry: Judgement and insight appear normal. Mood & affect appropriate.      Data Reviewed:   I have personally reviewed following labs and imaging studies:  Labs: Basic Metabolic Panel:  Recent Labs Lab 09/11/15 0322 09/12/15 0156 09/13/15 0453 09/14/15 0549 09/15/15 0419 09/16/15 0501  NA 138 137 139 136 136 136  K 3.9 4.4 4.6 5.3* 5.5* 5.2*  CL 108 110 113* 110 109 109  CO2 24 24 21* 17* 19* 18*  GLUCOSE 169* 153* 158* 263* 221* 164*  BUN 67* 59* 50* 52* 66* 66*  CREATININE 1.94* 1.72* 1.40* 1.61* 1.72* 1.53*  CALCIUM 8.7* 8.3* 8.6* 8.8* 9.1 8.7*  MG 2.0 2.0  --   --   --   --    GFR Estimated Creatinine Clearance: 32.5 mL/min (by C-G formula based on SCr of 1.53 mg/dL). Liver Function Tests:  Recent Labs Lab 09/11/15 0322 09/12/15 0156 09/13/15 0453 09/14/15 0549  AST 26 28 29  37  ALT 25 26 29  35  ALKPHOS 77 82 96 116  BILITOT 1.1 0.9 1.0 1.0  PROT 5.1* 4.9* 5.2* 5.0*  ALBUMIN 2.9* 2.8* 2.8* 2.8*   CBC:  Recent Labs Lab 09/12/15 0156 09/12/15 1011 09/13/15 0453 09/14/15 0549 09/15/15 0419 09/16/15 0501  WBC 1.9*  --   2.0* 2.3* 3.5* 2.5*  NEUTROABS 1.3* 1.9 1.6* 2.1 3.2  --   HGB 8.7*  --  8.8* 8.7* 8.9* 8.4*  HCT 27.2*  --  28.2* 27.8* 28.6* 26.5*  MCV 90.4  --  91.0 89.7 89.1 89.5  PLT 20*  --  22* 29* 30* 28*   CBG:  Recent Labs Lab 09/15/15 0644 09/15/15 1140 09/15/15 1636 09/15/15 2111 09/16/15 0607  GLUCAP 201* 153* 215* 236* 152*   Sepsis Labs:  Recent Labs Lab 09/09/15 1204  09/11/15 0322  09/13/15 0453 09/14/15 0549 09/15/15 0419 09/15/15 0848 09/16/15 0501  WBC  --   < > 1.8*  < > 2.0* 2.3* 3.5*  --  2.5*  LATICACIDVEN 1.52  --  1.4  --   --   --   --  2.5*  --   < > = values in this interval not displayed.  Microbiology Recent Results (from the past 240 hour(s))  Culture, blood (routine x 2)  Status: None   Collection Time: 09/09/15 11:50 AM  Result Value Ref Range Status   Specimen Description BLOOD RIGHT ANTECUBITAL  Final   Special Requests BOTTLES DRAWN AEROBIC AND ANAEROBIC 5CC  Final   Culture NO GROWTH 5 DAYS  Final   Report Status 09/14/2015 FINAL  Final  Culture, blood (routine x 2)     Status: None   Collection Time: 09/09/15 12:09 PM  Result Value Ref Range Status   Specimen Description BLOOD RIGHT FOREARM  Final   Special Requests BOTTLES DRAWN AEROBIC AND ANAEROBIC 5CC  Final   Culture NO GROWTH 5 DAYS  Final   Report Status 09/14/2015 FINAL  Final  Urine culture     Status: Abnormal   Collection Time: 09/09/15 12:49 PM  Result Value Ref Range Status   Specimen Description URINE, CLEAN CATCH  Final   Special Requests Normal  Final   Culture MULTIPLE SPECIES PRESENT, SUGGEST RECOLLECTION (A)  Final   Report Status 09/10/2015 FINAL  Final  Culture, Urine     Status: Abnormal   Collection Time: 09/10/15  1:10 PM  Result Value Ref Range Status   Specimen Description URINE, RANDOM  Final   Special Requests NONE  Final   Culture <10,000 COLONIES/mL INSIGNIFICANT GROWTH (A)  Final   Report Status 09/11/2015 FINAL  Final    Radiology: No results  found.  Medications:   . amiodarone  200 mg Oral Daily  . ceFEPime (MAXIPIME) IV  1 g Intravenous Q24H  . insulin aspart  0-15 Units Subcutaneous TID WC  . insulin aspart  0-5 Units Subcutaneous QHS  . insulin detemir  20 Units Subcutaneous Daily  . levothyroxine  25 mcg Oral QAC breakfast  . methylPREDNISolone (SOLU-MEDROL) injection  40 mg Intravenous Q12H  . polyethylene glycol  17 g Oral Daily  . propranolol  5 mg Oral BID  . sertraline  50 mg Oral QHS  . vancomycin  750 mg Intravenous Q24H   Continuous Infusions:   Medical decision making is of high complexity and this patient is at high risk of deterioration, therefore this is a level 3 visit. He has multiple chronic conditions, an exacerbation of a chronic condition, and the 2 new persistent problems including hyperkalemia and metabolic acidosis require further follow-up and treatment.   LOS: 6 days   [facility] Pager [phone]. If unable to reach me by pager, please call my cell phone at [phone].  *Please refer to [email or web address], password [email or web address] to get updated schedule on who will round on this patient, as hospitalists switch teams weekly. If 7PM-7AM, please contact night-coverage at [email or web address], password [email or web address] for any overnight needs.  09/16/2015, 8:52 AM

## 2015-09-16 NOTE — Progress Notes (Signed)
Pharmacy Antibiotic Note  [name] is a 82 y.o. male admitted on 09/09/2015 with LLE cellulitis.  Pharmacy has been consulted for Vancomycin dosing.  Today is day #8 of therapy.  Lactic Acid is elevated but trending down.  Scr is also improving.  Cx data negative.  Plan: Cotn[place]ue Vancomycin 750mg  IV q24h  Height: 5\' 10"  (177.8 cm) Weight: 172 lb 14.4 oz (78.4 kg) IBW/kg (Calculated) : 73  Temp (24hrs), Avg:98 F (36.7 C), Min:97.7 F (36.5 C), Max:98.6 F (37 C)   Recent Labs Lab 09/11/15 0322 09/12/15 0156 09/13/15 0453 09/13/15 1216 09/14/15 0549 09/15/15 0419 09/15/15 0848 09/16/15 0501 09/16/15 0923  WBC 1.8* 1.9* 2.0*  --  2.3* 3.5*  --  2.5*  --   CREATININE 1.94* 1.72* 1.40*  --  1.61* 1.72*  --  1.53*  --   LATICACIDVEN 1.4  --   --   --   --   --  2.5*  --  2.0*  VANCOTROUGH  --   --   --  14*  --   --   --   --   --     Estimated Creatinine Clearance: 32.5 mL/min (by C-G formula based on SCr of 1.53 mg/dL).    Allergies  Allergen Reactions  . Penicillins Other (See Comments)    08/15/2015:  Tolerated Cefepime Does not remember rxn (~1950) Has patient had a PCN reaction causing immediate rash, facial/tongue/throat swelling, SOB or lightheadedness with hypotension:YES Has patient had a PCN reaction causing severe rash involving mucus membranes or skin necrosis: NO Has patient had a PCN reaction that required hospitalization NO Has patient had a PCN reaction occurring within the last 10 years: NO If all of the above answers are "NO", then may proceed with Cephalosporin use.  [name] Kitchen Neomycin-Bacitracin Zn-Polymyx Rash and Other (See Comments)    ? Reaction (thinks he remembers redness)    Antimicrobials this admission: Vanc 9/1 >> Cefepime 9/7 >>  Dose adjustments this admission: 9/5 VT = 14  Microbiology results: 9/3 BCx x2: ng F 9/2 UCx: insignificant growth 9/1 UCx: mult species  Thank you for allowing pharmacy to be a part of this patient's  care.  [employer], Pharm.D., BCPS Clinical Pharmacist Pager [phone] 09/16/2015 1:56 PM

## 2015-09-16 NOTE — Progress Notes (Signed)
Critical lab report lactic acid is 2.0 per Dedra Skeens

## 2015-09-16 NOTE — Care Management Important Message (Signed)
Important Message  Patient Details  Name: [name] MRN: [id number] Date of Birth: January 15, 1922   Medicare Important Message Given:  Yes    [name] Montine Circle 09/16/2015, 10:41 AM

## 2015-09-16 NOTE — Progress Notes (Signed)
INFECTIOUS DISEASE PROGRESS NOTE  ID: [name] is a 82 y.o. male with  Principal Problem:   Left leg cellulitis Active Problems:   Type 2 diabetes mellitus with complication (HCC)   Gout   Essential hypertension   Other pancytopenia (HCC)   Hypothyroidism   Paroxysmal atrial fibrillation (HCC)   CKD (chronic kidney disease), stage III   Depression   S/P TAVR (transcatheter aortic valve replacement)   GERD (gastroesophageal reflux disease)   Chronic diastolic congestive heart failure (HCC)   Lymphoproliferative disorder (HCC)   Recurrent cellulitis of lower leg   Anemia due to bone marrow failure (HCC)   Metabolic acidosis   Hyperkalemia  Subjective: Without complaints.   Abtx:  Anti-infectives    Start     Dose/Rate Route Frequency Ordered Stop   09/15/15 1300  ceFEPIme (MAXIPIME) 1 g in dextrose 5 % 50 mL IVPB     1 g 100 mL/hr over 30 Minutes Intravenous Every 24 hours 09/15/15 1226     09/10/15 1300  vancomycin (VANCOCIN) IVPB 750 mg/150 ml premix     750 mg 150 mL/hr over 60 Minutes Intravenous Every 24 hours 09/09/15 1239     09/09/15 1200  vancomycin (VANCOCIN) 1,500 mg in sodium chloride 0.9 % 500 mL IVPB     1,500 mg 250 mL/hr over 120 Minutes Intravenous  Once 09/09/15 1157 09/09/15 1432   09/09/15 1145  vancomycin (VANCOCIN) IVPB 1000 mg/200 mL premix  Status:  Discontinued     1,000 mg 200 mL/hr over 60 Minutes Intravenous  Once 09/09/15 1142 09/09/15 1142      Medications:  Scheduled: . amiodarone  200 mg Oral Daily  . ceFEPime (MAXIPIME) IV  1 g Intravenous Q24H  . insulin aspart  0-15 Units Subcutaneous TID WC  . insulin aspart  0-5 Units Subcutaneous QHS  . insulin detemir  20 Units Subcutaneous Daily  . levothyroxine  25 mcg Oral QAC breakfast  . methylPREDNISolone (SOLU-MEDROL) injection  40 mg Intravenous Q12H  . polyethylene glycol  17 g Oral Daily  . propranolol  5 mg Oral BID  . sertraline  50 mg Oral QHS  . sodium chloride  1,000  mL Intravenous Once  . terbinafine   Topical BID  . vancomycin  750 mg Intravenous Q24H    Objective: Vital signs in last 24 hours: Temp:  [97.7 F (36.5 C)-98.6 F (37 C)] 97.8 F (36.6 C) (09/08 1243) Pulse Rate:  [86-87] 86 (09/08 1243) Resp:  [16-18] 16 (09/08 1243) BP: (112-138)/(60-75) 122/68 (09/08 1243) SpO2:  [98 %-100 %] 100 % (09/08 1243)   General appearance: alert, cooperative and no distress Extremities: 3+ edema in LLE, decreased heat, deacreased tenderness in his ankle.   Lab Results  Recent Labs  09/15/15 0419 09/16/15 0501  WBC 3.5* 2.5*  HGB 8.9* 8.4*  HCT 28.6* 26.5*  NA 136 136  K 5.5* 5.2*  CL 109 109  CO2 19* 18*  BUN 66* 66*  CREATININE 1.72* 1.53*   Liver Panel  Recent Labs  09/14/15 0549  PROT 5.0*  ALBUMIN 2.8*  AST 37  ALT 35  ALKPHOS 116  BILITOT 1.0   Sedimentation Rate No results for input(s): ESRSEDRATE in the last 72 hours. C-Reactive Protein No results for input(s): CRP in the last 72 hours.  Microbiology: Recent Results (from the past 240 hour(s))  Culture, blood (routine x 2)     Status: None   Collection Time: 09/09/15 11:50 AM  Result Value Ref Range Status   Specimen Description BLOOD RIGHT ANTECUBITAL  Final   Special Requests BOTTLES DRAWN AEROBIC AND ANAEROBIC 5CC  Final   Culture NO GROWTH 5 DAYS  Final   Report Status 09/14/2015 FINAL  Final  Culture, blood (routine x 2)     Status: None   Collection Time: 09/09/15 12:09 PM  Result Value Ref Range Status   Specimen Description BLOOD RIGHT FOREARM  Final   Special Requests BOTTLES DRAWN AEROBIC AND ANAEROBIC 5CC  Final   Culture NO GROWTH 5 DAYS  Final   Report Status 09/14/2015 FINAL  Final  Urine culture     Status: Abnormal   Collection Time: 09/09/15 12:49 PM  Result Value Ref Range Status   Specimen Description URINE, CLEAN CATCH  Final   Special Requests Normal  Final   Culture MULTIPLE SPECIES PRESENT, SUGGEST RECOLLECTION (A)  Final   Report  Status 09/10/2015 FINAL  Final  Culture, Urine     Status: Abnormal   Collection Time: 09/10/15  1:10 PM  Result Value Ref Range Status   Specimen Description URINE, RANDOM  Final   Special Requests NONE  Final   Culture <10,000 COLONIES/mL INSIGNIFICANT GROWTH (A)  Final   Report Status 09/11/2015 FINAL  Final    Studies/Results: No results found.   Assessment/Plan: Cellulitis (+/- gout) Cytopenias (chronic, unchanged)  Total days of antibiotics: 6 vanco, 1 cefepime  Hypotension improved, cytopenias unchanged, lactic acid slightly better.  Will watch, he has made some progress (less heat and tenderness). Not clear how much of this is steroid effect.  Dr Megan Salon available if questions over weekend.          Bobby Rumpf Infectious Diseases (pager) [phone] [email or web address] 09/16/2015, 3:51 PM  LOS: 6 days

## 2015-09-16 NOTE — Plan of Care (Signed)
Problem: Physical Regulation: Goal: Will remain free from infection Outcome: Progressing Antibiotics as ordered.

## 2015-09-16 NOTE — Care Management Note (Addendum)
Case Management Note  Patient Details  Name: RUBEN ALTIZER MRN: [id number] Date of Birth: 1922/10/20  Subjective/Objective:    82 yr old gentleman admitted with cellulitis of legs. Patient also has developed hyperkalemia and metabolic acidosis. Being medically managed.                Action/Plan: Case manager spoke with patient concerning his discharge needs. His daughter will be coming to assist him. Patient has been setup with [facility] as a [place] patient. CM called Mirna Mires @ [phone] confirm after yesterday's conversation. Patient is in agreement. Case manager contacted Santiago Glad, [facility] Liaison with referral for IV antibiotics. Patient will be having PICC placed.   Expected Discharge Date:   09/19/15               Expected Discharge Plan:  [place]  In-House Referral:     Discharge planning Services  CM Consult  Post Acute Care Choice:  Home Health, Durable Medical Equipment Choice offered to:  Patient  DME Arranged:  IV pump/equipment DME Agency:  [place]:  RN [facility] Agency:  [facility] Status of Service:  In process, will continue to follow  If discussed at Long Length of Stay Meetings, dates discussed:    Additional Comments:  Ninfa Meeker, RN 09/16/2015, 2:40 PM

## 2015-09-17 DIAGNOSIS — E872 Acidosis, unspecified: Secondary | ICD-10-CM | POA: Diagnosis present

## 2015-09-17 LAB — GLUCOSE, CAPILLARY
GLUCOSE-CAPILLARY: 192 mg/dL — AB (ref 65–99)
Glucose-Capillary: 230 mg/dL — ABNORMAL HIGH (ref 65–99)
Glucose-Capillary: 177 mg/dL — ABNORMAL HIGH (ref 65–99)
Glucose-Capillary: 185 mg/dL — ABNORMAL HIGH (ref 65–99)

## 2015-09-17 LAB — BASIC METABOLIC PANEL
Anion gap: 8 (ref 5–15)
BUN: 68 mg/dL — AB (ref 6–20)
CHLORIDE: 112 mmol/L — AB (ref 101–111)
CO2: 19 mmol/L — ABNORMAL LOW (ref 22–32)
CREATININE: 1.54 mg/dL — AB (ref 0.61–1.24)
Calcium: 8.8 mg/dL — ABNORMAL LOW (ref 8.9–10.3)
GFR, EST AFRICAN AMERICAN: 44 mL/min — AB (ref >60)
GFR, EST NON AFRICAN AMERICAN: 38 mL/min — AB (ref >60)
Glucose, Bld: 170 mg/dL — ABNORMAL HIGH (ref 65–99)
POTASSIUM: 5.3 mmol/L — AB (ref 3.5–5.1)
SODIUM: 139 mmol/L (ref 135–145)

## 2015-09-17 LAB — CBC
HCT: 24 % — ABNORMAL LOW (ref 39.0–52.0)
HEMOGLOBIN: 7.6 g/dL — AB (ref 13.0–17.0)
MCH: 27.8 pg (ref 26.0–34.0)
MCHC: 31.7 g/dL (ref 30.0–36.0)
MCV: 87.9 fL (ref 78.0–100.0)
PLATELETS: 30 10*3/uL — AB (ref 150–400)
RBC: 2.73 MIL/uL — AB (ref 4.22–5.81)
RDW: 16.5 % — ABNORMAL HIGH (ref 11.5–15.5)
WBC: 1.7 10*3/uL — AB (ref 4.0–10.5)

## 2015-09-17 LAB — LACTIC ACID, PLASMA: LACTIC ACID, VENOUS: 2.4 mmol/L — AB (ref 0.5–1.9)

## 2015-09-17 MED ORDER — SODIUM POLYSTYRENE SULFONATE 15 GM/60ML PO SUSP
15.0000 g | Freq: Once | ORAL | Status: AC
  Administered 2015-09-17: 15 g via ORAL
  Filled 2015-09-17: qty 60

## 2015-09-17 MED ORDER — SODIUM BICARBONATE 650 MG PO TABS
1300.0000 mg | ORAL_TABLET | Freq: Two times a day (BID) | ORAL | Status: DC
  Administered 2015-09-17 – 2015-09-23 (×12): 1300 mg via ORAL
  Filled 2015-09-17 (×13): qty 2

## 2015-09-17 MED ORDER — VITAMIN B-1 100 MG PO TABS
100.0000 mg | ORAL_TABLET | Freq: Every day | ORAL | Status: DC
  Administered 2015-09-17 – 2015-09-23 (×7): 100 mg via ORAL
  Filled 2015-09-17 (×7): qty 1

## 2015-09-17 NOTE — Plan of Care (Signed)
Problem: Physical Regulation: Goal: Will remain free from infection Outcome: Progressing No s/s of infection noted  Problem: Bowel/Gastric: Goal: Will not experience complications related to bowel motility Outcome: Progressing No bowel issues reported

## 2015-09-17 NOTE — Progress Notes (Signed)
CRITICAL VALUE ALERT  Critical value received: 2.4 lactid Acid  Date of notification: 09/17/2015  Time of notification:  [id number]  Critical value read back:Yes.    Nurse who received alert:  Jerry Caras, RN  MD notified (1st page):  [phone]

## 2015-09-17 NOTE — Progress Notes (Signed)
Progress Note    [name]  [id number] DOB: 11-11-1922  DOA: 09/09/2015 PCP: Binnie Rail, MD    Brief Narrative:   Chief complaint: Follow-up left leg pain and swelling.  STEEN KICK is an 82 y.o. male with a PMH of severe AS--> S/P Aortic valve replacement surgery at [facility] on 01/23/2011 (porcine valve); S/P transcatheter aortic valve replacement [place] 05/14/2014 (bovine valve) , chronic diastolic CHF, PAF not on anticoagulation, bradycardia, QT prolongation, CKD (chronic kidney disease), stage III, Lymphoproliferative DO, throat cancer , severe pancytopenia followed by Dr. Benay Spice, recurrent cellulitis of left leg with history of sepsis/septic shock. His recent flare was treated with doxycycline, but due to failure of outpatient therapy he was referred for evaluation given his high risk of deterioration secondary to pancytopenia.  Assessment/Plan:   Principal Problem:   Left leg cellulitis/Recurrent cellulitis of lower leg in the setting of a lymphoproliferative active disorder and pancytopenia Being followed by ID who recommends ongoing treatment with vancomycin and Cefepime which was added 09/15/15. Blood cultures negative to date. MRI negative for abscess or osteomyelitis. Lactic acid remains persistently elevated, but doubt related to infection (see below).  Lamisil cream ordered to bilateral feet for Candida as this is likely serving as the portal of entry.  Active Problems:   Hyperkalemia/metabolic acidosis/lactic acidosis Repeat chemistries show persistent hyperkalemia/metabolic acidosis. Give an additional 15 g of Kayexalate today.Metabolic acidosis is likely from lactic acidosis. I suspect that this is non-hypoxic and may be related to renal disease or underlying malignancy (so called type B lactic acidosis---the treatment of which is to treat the underlying malignancy). Thiamine deficiency also in the differential. We'll place on thiamine supplementation. We'll  place on oral bicarbonate to see if this improves.    Type 2 diabetes mellitus with complication (HCC) Diabetes currently uncontrolled, likely secondary to steroids.  Currently being managed with Levemir 20 units daily and moderate scale SSI QAC/HS. CBGs [id number].    Gout Continue steroids. Continue oxycodone for pain control.    Essential hypertension Continue propranolol.    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    Hypothyroidism Continue Synthroid.    Paroxysmal atrial fibrillation (HCC) Continue Pacerone.    CKD (chronic kidney disease), stage III Continue to monitor creatinine. Baseline creatinine 1.6-1.9. Current creatinine consistent with usual baseline values.    Depression Continue Zoloft.    S/P TAVR (transcatheter aortic valve replacement)    GERD (gastroesophageal reflux disease) No complaints of heartburn.    Chronic diastolic congestive heart failure (HCC) No complaints of shortness of breath.  Family Communication/Anticipated D/C date and plan/Code Status   DVT prophylaxis: SCDs given thrombocytopenia. Code Status: Limited Code.  Family Communication: No family at bedside today, but neighbor (describes as "like a son") updated. Disposition Plan: Home in 1-2 days depending on resolution of cellulitis, hyperkalemia, and metabolic acidosis.   Medical Consultants:    Infectious Disease   Procedures:    None  Anti-Infectives:    Vancomycin 09/09/15--->  Subjective:   The patient reports that his ankle is slowly improving, rated 3/10, sharp with varying intensity.  Review of symptoms is positive for 2 loose stools yesterday and negative for fever, chills, nausea and vomiting, chest pain.  Objective:    Vitals:   09/16/15 0521 09/16/15 1243 09/16/15 2109 09/17/15 0730  BP: 112/60 122/68 130/72 (!) 155/82  Pulse: 86 86  88 98  Resp: 18 16 16    Temp: 97.7 F (36.5 C) 97.8 F (36.6 C) 97.1 F (36.2 C) 97.8 F (36.6 C)  TempSrc: Oral Oral Oral Oral  SpO2: 98% 100% 100% 100%  Weight:      Height:        Intake/Output Summary (Last 24 hours) at 09/17/15 0946 Last data filed at 09/17/15 0700  Gross per 24 hour  Intake             1540 ml  Output              375 ml  Net             1165 ml   Filed Weights   09/09/15 1042 09/10/15 0513 09/11/15 0800  Weight: 78.5 kg (173 lb) 78.4 kg (172 lb 13.5 oz) 78.4 kg (172 lb 14.4 oz)    Exam: General exam: Appears calm and comfortable.  Respiratory system: Clear to auscultation. Respiratory effort normal. Cardiovascular system: S1 & S2 heard, HSIR. No JVD,  rubs, gallops or clicks. No murmurs. Gastrointestinal system: Abdomen is soft and nontender. No organomegaly or masses felt. Normal bowel sounds heard. Central nervous system: Alert and oriented. No focal neurological deficits. Extremities: BLE swollen, worse on left. Skin: Extensive ecchymosis on all extremities.[name] Kitchen Psychiatry: Judgement and insight appear normal. Mood & affect appropriate.    Data Reviewed:   I have personally reviewed following labs and imaging studies:  Labs: Basic Metabolic Panel:  Recent Labs Lab 09/11/15 0322 09/12/15 0156 09/13/15 0453 09/14/15 0549 09/15/15 0419 09/16/15 0501 09/17/15 0409  NA 138 137 139 136 136 136 139  K 3.9 4.4 4.6 5.3* 5.5* 5.2* 5.3*  CL 108 110 113* 110 109 109 112*  CO2 24 24 21* 17* 19* 18* 19*  GLUCOSE 169* 153* 158* 263* 221* 164* 170*  BUN 67* 59* 50* 52* 66* 66* 68*  CREATININE 1.94* 1.72* 1.40* 1.61* 1.72* 1.53* 1.54*  CALCIUM 8.7* 8.3* 8.6* 8.8* 9.1 8.7* 8.8*  MG 2.0 2.0  --   --   --   --   --    GFR Estimated Creatinine Clearance: 32.3 mL/min (by C-G formula based on SCr of 1.54 mg/dL). Liver Function Tests:  Recent Labs Lab 09/11/15 0322 09/12/15 0156 09/13/15 0453 09/14/15 0549  AST 26 28 29  37  ALT 25 26 29  35    ALKPHOS 77 82 96 116  BILITOT 1.1 0.9 1.0 1.0  PROT 5.1* 4.9* 5.2* 5.0*  ALBUMIN 2.9* 2.8* 2.8* 2.8*   CBC:  Recent Labs Lab 09/12/15 0156 09/12/15 1011 09/13/15 0453 09/14/15 0549 09/15/15 0419 09/16/15 0501 09/17/15 0409  WBC 1.9*  --  2.0* 2.3* 3.5* 2.5* 1.7*  NEUTROABS 1.3* 1.9 1.6* 2.1 3.2  --   --   HGB 8.7*  --  8.8* 8.7* 8.9* 8.4* 7.6*  HCT 27.2*  --  28.2* 27.8* 28.6* 26.5* 24.0*  MCV 90.4  --  91.0 89.7 89.1 89.5 87.9  PLT 20*  --  22* 29* 30* 28* 30*   CBG:  Recent Labs Lab 09/16/15 0607 09/16/15 1103 09/16/15 1609 09/16/15 2112 09/17/15 0722  GLUCAP 152* 148* 155* 248* 192*   Sepsis Labs:  Recent Labs Lab 09/11/15 0322  09/14/15 0549 09/15/15 0419 09/15/15 0848 09/16/15 0501 09/16/15 0923 09/17/15 0409  WBC 1.8*  < > 2.3* 3.5*  --  2.5*  --  1.7*  LATICACIDVEN 1.4  --   --   --  2.5*  --  2.0*  --   < > =  values in this interval not displayed.  Microbiology Recent Results (from the past 240 hour(s))  Culture, blood (routine x 2)     Status: None   Collection Time: 09/09/15 11:50 AM  Result Value Ref Range Status   Specimen Description BLOOD RIGHT ANTECUBITAL  Final   Special Requests BOTTLES DRAWN AEROBIC AND ANAEROBIC 5CC  Final   Culture NO GROWTH 5 DAYS  Final   Report Status 09/14/2015 FINAL  Final  Culture, blood (routine x 2)     Status: None   Collection Time: 09/09/15 12:09 PM  Result Value Ref Range Status   Specimen Description BLOOD RIGHT FOREARM  Final   Special Requests BOTTLES DRAWN AEROBIC AND ANAEROBIC 5CC  Final   Culture NO GROWTH 5 DAYS  Final   Report Status 09/14/2015 FINAL  Final  Urine culture     Status: Abnormal   Collection Time: 09/09/15 12:49 PM  Result Value Ref Range Status   Specimen Description URINE, CLEAN CATCH  Final   Special Requests Normal  Final   Culture MULTIPLE SPECIES PRESENT, SUGGEST RECOLLECTION (A)  Final   Report Status 09/10/2015 FINAL  Final  Culture, Urine     Status: Abnormal    Collection Time: 09/10/15  1:10 PM  Result Value Ref Range Status   Specimen Description URINE, RANDOM  Final   Special Requests NONE  Final   Culture <10,000 COLONIES/mL INSIGNIFICANT GROWTH (A)  Final   Report Status 09/11/2015 FINAL  Final    Radiology: No results found.  Medications:   . amiodarone  200 mg Oral Daily  . ceFEPime (MAXIPIME) IV  1 g Intravenous Q24H  . insulin aspart  0-15 Units Subcutaneous TID WC  . insulin aspart  0-5 Units Subcutaneous QHS  . insulin detemir  20 Units Subcutaneous Daily  . levothyroxine  25 mcg Oral QAC breakfast  . methylPREDNISolone (SOLU-MEDROL) injection  40 mg Intravenous Q12H  . polyethylene glycol  17 g Oral Daily  . propranolol  5 mg Oral BID  . sertraline  50 mg Oral QHS  . sodium polystyrene  15 g Oral Once  . terbinafine   Topical BID  . vancomycin  750 mg Intravenous Q24H   Continuous Infusions:   Medical decision making is of high complexity and this patient is at high risk of deterioration, therefore this is a level 3 visit. He has multiple chronic conditions, an exacerbation of a chronic condition, and the 2 new persistent problems including hyperkalemia and metabolic acidosis require further follow-up and treatment.   LOS: 7 days   [facility] Pager [phone]. If unable to reach me by pager, please call my cell phone at [phone].  *Please refer to [email or web address], password [email or web address] to get updated schedule on who will round on this patient, as hospitalists switch teams weekly. If 7PM-7AM, please contact night-coverage at [email or web address], password [email or web address] for any overnight needs.  09/17/2015, 9:46 AM

## 2015-09-18 LAB — CBC
HCT: 27.1 % — ABNORMAL LOW (ref 39.0–52.0)
Hemoglobin: 8.5 g/dL — ABNORMAL LOW (ref 13.0–17.0)
MCH: 28.1 pg (ref 26.0–34.0)
MCHC: 31.4 g/dL (ref 30.0–36.0)
MCV: 89.7 fL (ref 78.0–100.0)
PLATELETS: 37 10*3/uL — AB (ref 150–400)
RBC: 3.02 MIL/uL — AB (ref 4.22–5.81)
RDW: 16.6 % — AB (ref 11.5–15.5)
WBC: 2.5 10*3/uL — AB (ref 4.0–10.5)

## 2015-09-18 LAB — GLUCOSE, CAPILLARY
GLUCOSE-CAPILLARY: 198 mg/dL — AB (ref 65–99)
GLUCOSE-CAPILLARY: 288 mg/dL — AB (ref 65–99)
GLUCOSE-CAPILLARY: 199 mg/dL — AB (ref 65–99)
Glucose-Capillary: 229 mg/dL — ABNORMAL HIGH (ref 65–99)

## 2015-09-18 LAB — BASIC METABOLIC PANEL
ANION GAP: 7 (ref 5–15)
BUN: 73 mg/dL — ABNORMAL HIGH (ref 6–20)
CALCIUM: 8.8 mg/dL — AB (ref 8.9–10.3)
CO2: 19 mmol/L — ABNORMAL LOW (ref 22–32)
Chloride: 113 mmol/L — ABNORMAL HIGH (ref 101–111)
Creatinine, Ser: 1.67 mg/dL — ABNORMAL HIGH (ref 0.61–1.24)
GFR, EST AFRICAN AMERICAN: 40 mL/min — AB (ref >60)
GFR, EST NON AFRICAN AMERICAN: 34 mL/min — AB (ref >60)
Glucose, Bld: 188 mg/dL — ABNORMAL HIGH (ref 65–99)
POTASSIUM: 5.2 mmol/L — AB (ref 3.5–5.1)
SODIUM: 139 mmol/L (ref 135–145)

## 2015-09-18 LAB — LACTIC ACID, PLASMA: LACTIC ACID, VENOUS: 3.2 mmol/L — AB (ref 0.5–1.9)

## 2015-09-18 MED ORDER — INSULIN DETEMIR 100 UNIT/ML ~~LOC~~ SOLN
25.0000 [IU] | Freq: Every day | SUBCUTANEOUS | Status: DC
  Administered 2015-09-18 – 2015-09-19 (×2): 25 [IU] via SUBCUTANEOUS
  Filled 2015-09-18 (×3): qty 0.25

## 2015-09-18 MED ORDER — SODIUM CHLORIDE 0.9 % IV SOLN
INTRAVENOUS | Status: DC
  Administered 2015-09-22: 13:00:00 via INTRAVENOUS

## 2015-09-18 MED ORDER — METHYLPREDNISOLONE SODIUM SUCC 125 MG IJ SOLR
40.0000 mg | Freq: Every day | INTRAMUSCULAR | Status: DC
  Administered 2015-09-18 – 2015-09-19 (×2): 40 mg via INTRAVENOUS
  Filled 2015-09-18 (×2): qty 2

## 2015-09-18 MED ORDER — SODIUM POLYSTYRENE SULFONATE 15 GM/60ML PO SUSP
30.0000 g | Freq: Once | ORAL | Status: AC
  Administered 2015-09-18: 30 g via ORAL
  Filled 2015-09-18: qty 120

## 2015-09-18 NOTE — Progress Notes (Signed)
CRITICAL VALUE ALERT  Critical value received:  1050  Date of notification:  09/18/2015  Time of notification:  [id number]  Critical value read back:Yes.    Nurse who received alert: Willia Craze, RN  MD notified (1st page): [phone]

## 2015-09-18 NOTE — Progress Notes (Signed)
Progress Note    [name]  [id number] DOB: [birth year]  DOA: 09/09/2015 PCP: Binnie Rail, MD    Brief Narrative:   Chief complaint: Follow-up left leg pain and swelling.  [name] is an 82 y.o. 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 Benay Spice, recurrent cellulitis of left leg with history of sepsis/septic shock. His recent flare was treated with doxycycline, but due to failure of outpatient therapy he was referred for evaluation given his high risk of deterioration secondary to pancytopenia.  Assessment/Plan:   Principal Problem:   Left leg cellulitis/Recurrent cellulitis of lower leg in the setting of a lymphoproliferative active disorder and pancytopenia Being followed by ID who recommends ongoing treatment with vancomycin and Cefepime which was added 09/15/15. Blood cultures negative. MRI negative for abscess or osteomyelitis. Lactic acid remains persistently elevated, but doubt related to infection (see below).  Lamisil cream ordered to bilateral feet for Candida as this is likely serving as the portal of entry.Overall, the left leg/foot is less erythematous and swollen.  Active Problems:   Hyperkalemia/metabolic acidosis/lactic acidosis Repeat chemistries show persistent hyperkalemia/metabolic acidosis. Has not responded to Kayexalate. Metabolic acidosis is likely from lactic acidosis. I suspect that this is non-hypoxic and may be related to renal disease or underlying malignancy (so called type B lactic acidosis---the treatment of which is to treat the underlying malignancy). Thiamine deficiency also in the differential. Continue  thiamine supplementation and oral bicarbonate. Start IV fluids at 100 mL an hour with normal saline. Give 30 g of Kayexalate today.    Type 2 diabetes mellitus with complication (HCC) Diabetes currently uncontrolled, likely secondary to steroids.  Currently being managed with Levemir 20 units daily and moderate scale SSI QAC/HS. CBGs 177-230. Increase Levemir to 25 units.    Gout Decrease Solu-Medrol to 40 mg daily. Continue oxycodone for pain control.    Essential hypertension Continue propranolol.    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 We'll contact Dr. Benay Spice tomorrow to discuss his persistent lactic acidosis to see if it might be related to his lymphoproliferative disorder.    Hypothyroidism Continue Synthroid.    Paroxysmal atrial fibrillation (HCC) Continue Pacerone.    CKD (chronic kidney disease), stage III Continue to monitor creatinine. Baseline creatinine 1.6-1.9. Current creatinine consistent with usual baseline values. Continue to monitor closely for deterioration of renal function while on vancomycin.    Depression Continue Zoloft.    S/P TAVR (transcatheter aortic valve replacement)    GERD (gastroesophageal reflux disease) No complaints of heartburn.    Chronic diastolic congestive heart failure (HCC) No complaints of shortness of breath.  Family Communication/Anticipated D/C date and plan/Code Status   DVT prophylaxis: SCDs given thrombocytopenia. Code Status: Limited Code.  Family Communication: Daughter updated at the bedside. Disposition Plan: Possibly home tomorrow depending on clearance from ID and hematology/oncology.   Medical Consultants:    Infectious Disease   Procedures:    None  Anti-Infectives:    Vancomycin 09/09/15--->  Cefepime 09/15/15--->   Subjective:   The patient reports that his ankle is  slowly improving, rated 4-5/10, sharp with varying intensity.  Review of  symptoms is negative for fever, chills, nausea and vomiting, chest pain. Last BM was yesterday x 2.  Objective:    Vitals:   09/17/15 0730 09/17/15 1300 09/17/15 2007 09/18/15 0344  BP: (!) 155/82 116/70 129/75 130/74  Pulse: 98 92 88 91  Resp:      Temp: 97.8 F (36.6 C) 98.2 F (36.8 C) 98 F (36.7 C) 97.6 F (36.4 C)  TempSrc: Oral Oral Oral Oral  SpO2: 100% 99% 99% 100%  Weight:      Height:        Intake/Output Summary (Last 24 hours) at 09/18/15 0840 Last data filed at 09/18/15 0636  Gross per 24 hour  Intake                0 ml  Output             1100 ml  Net            -1100 ml   Filed Weights   09/09/15 1042 09/10/15 0513 09/11/15 0800  Weight: 78.5 kg (173 lb) 78.4 kg (172 lb 13.5 oz) 78.4 kg (172 lb 14.4 oz)    Exam: General exam: Appears calm and comfortable.  Respiratory system: Clear to auscultation. Respiratory effort normal. Cardiovascular system: S1 & S2 heard, HSIR. No JVD,  rubs, gallops or clicks. No murmurs. Gastrointestinal system: Abdomen is soft and nontender. No organomegaly or masses felt. Normal bowel sounds heard. Central nervous system: Alert and oriented. No focal neurological deficits. Extremities: BLE swollen, improving on left (pictured below). Skin: Extensive ecchymosis on all extremities.[name] Kitchen Psychiatry: Judgement and insight appear normal. Mood & affect mildly depressed.      Data Reviewed:   I have personally reviewed following labs and imaging studies:  Labs: Basic Metabolic Panel:  Recent Labs Lab 09/12/15 0156  09/14/15 0549 09/15/15 0419 09/16/15 0501 09/17/15 0409 09/18/15 0401  NA 137  < > 136 136 136 139 139  K 4.4  < > 5.3* 5.5* 5.2* 5.3* 5.2*  CL 110  < > 110 109 109 112* 113*  CO2 24  < > 17* 19* 18* 19* 19*  GLUCOSE 153*  < > 263* 221* 164* 170* 188*  BUN 59*  < > 52* 66* 66* 68* 73*  CREATININE 1.72*  < > 1.61* 1.72* 1.53* 1.54*  1.67*  CALCIUM 8.3*  < > 8.8* 9.1 8.7* 8.8* 8.8*  MG 2.0  --   --   --   --   --   --   < > = values in this interval not displayed. GFR Estimated Creatinine Clearance: 29.7 mL/min (by C-G formula based on SCr of 1.67 mg/dL). Liver Function Tests:  Recent Labs Lab 09/12/15 0156 09/13/15 0453 09/14/15 0549  AST 28 29 37  ALT 26 29 35  ALKPHOS 82 96 116  BILITOT 0.9 1.0 1.0  PROT 4.9* 5.2* 5.0*  ALBUMIN 2.8* 2.8* 2.8*   CBC:  Recent Labs Lab 09/12/15 0156 09/12/15 1011 09/13/15 0453 09/14/15 0549 09/15/15 0419 09/16/15 0501 09/17/15 0409 09/18/15 0401  WBC 1.9*  --  2.0* 2.3* 3.5* 2.5* 1.7* 2.5*  NEUTROABS 1.3* 1.9 1.6* 2.1 3.2  --   --   --   HGB 8.7*  --  8.8* 8.7* 8.9* 8.4* 7.6* 8.5*  HCT 27.2*  --  28.2* 27.8* 28.6* 26.5* 24.0* 27.1*  MCV 90.4  --  91.0 89.7 89.1 89.5 87.9 89.7  PLT 20*  --  22* 29* 30* 28* 30* 37*  CBG:  Recent Labs Lab 09/17/15 0722 09/17/15 1130 09/17/15 1634 09/17/15 2011 09/18/15 0616  GLUCAP 192* 230* 177* 185* 198*   Sepsis Labs:  Recent Labs Lab 09/15/15 0419 09/15/15 0848 09/16/15 0501 09/16/15 0923 09/17/15 0409 09/17/15 1114 09/18/15 0401  WBC 3.5*  --  2.5*  --  1.7*  --  2.5*  LATICACIDVEN  --  2.5*  --  2.0*  --  2.4*  --     Microbiology Recent Results (from the past 240 hour(s))  Culture, blood (routine x 2)     Status: None   Collection Time: 09/09/15 11:50 AM  Result Value Ref Range Status   Specimen Description BLOOD RIGHT ANTECUBITAL  Final   Special Requests BOTTLES DRAWN AEROBIC AND ANAEROBIC 5CC  Final   Culture NO GROWTH 5 DAYS  Final   Report Status 09/14/2015 FINAL  Final  Culture, blood (routine x 2)     Status: None   Collection Time: 09/09/15 12:09 PM  Result Value Ref Range Status   Specimen Description BLOOD RIGHT FOREARM  Final   Special Requests BOTTLES DRAWN AEROBIC AND ANAEROBIC 5CC  Final   Culture NO GROWTH 5 DAYS  Final   Report Status 09/14/2015 FINAL  Final  Urine culture      Status: Abnormal   Collection Time: 09/09/15 12:49 PM  Result Value Ref Range Status   Specimen Description URINE, CLEAN CATCH  Final   Special Requests Normal  Final   Culture MULTIPLE SPECIES PRESENT, SUGGEST RECOLLECTION (A)  Final   Report Status 09/10/2015 FINAL  Final  Culture, Urine     Status: Abnormal   Collection Time: 09/10/15  1:10 PM  Result Value Ref Range Status   Specimen Description URINE, RANDOM  Final   Special Requests NONE  Final   Culture <10,000 COLONIES/mL INSIGNIFICANT GROWTH (A)  Final   Report Status 09/11/2015 FINAL  Final    Radiology: No results found.  Medications:   . amiodarone  200 mg Oral Daily  . ceFEPime (MAXIPIME) IV  1 g Intravenous Q24H  . insulin aspart  0-15 Units Subcutaneous TID WC  . insulin aspart  0-5 Units Subcutaneous QHS  . insulin detemir  20 Units Subcutaneous Daily  . levothyroxine  25 mcg Oral QAC breakfast  . methylPREDNISolone (SOLU-MEDROL) injection  40 mg Intravenous Q12H  . polyethylene glycol  17 g Oral Daily  . propranolol  5 mg Oral BID  . sertraline  50 mg Oral QHS  . sodium bicarbonate  1,300 mg Oral BID  . terbinafine   Topical BID  . thiamine  100 mg Oral Daily  . vancomycin  750 mg Intravenous Q24H   Continuous Infusions:   Medical decision making is of high complexity and this patient is at high risk of deterioration, therefore this is a level 3 visit. He has multiple chronic conditions, an exacerbation of a chronic condition, and the 2 new persistent problems including hyperkalemia and metabolic acidosis require further follow-up and treatment.   LOS: 8 days   [facility] Pager [phone]. If unable to reach me by pager, please call my cell phone at [phone].  *Please refer to [email or web address], password [email or web address] to get updated schedule on who will round on this patient, as hospitalists switch teams weekly. If 7PM-7AM, please contact night-coverage at [email or web address], password [email or web address] for any  overnight needs.  09/18/2015, 8:40 AM

## 2015-09-19 ENCOUNTER — Inpatient Hospital Stay (HOSPITAL_COMMUNITY): Payer: Commercial Managed Care - HMO

## 2015-09-19 ENCOUNTER — Telehealth: Payer: Self-pay | Admitting: Nurse Practitioner

## 2015-09-19 ENCOUNTER — Other Ambulatory Visit: Payer: Self-pay

## 2015-09-19 DIAGNOSIS — D759 Disease of blood and blood-forming organs, unspecified: Secondary | ICD-10-CM

## 2015-09-19 DIAGNOSIS — K4090 Unilateral inguinal hernia, without obstruction or gangrene, not specified as recurrent: Secondary | ICD-10-CM

## 2015-09-19 DIAGNOSIS — K469 Unspecified abdominal hernia without obstruction or gangrene: Secondary | ICD-10-CM

## 2015-09-19 LAB — BASIC METABOLIC PANEL
Anion gap: 10 (ref 5–15)
BUN: 66 mg/dL — AB (ref 6–20)
CALCIUM: 8.6 mg/dL — AB (ref 8.9–10.3)
CHLORIDE: 111 mmol/L (ref 101–111)
CO2: 19 mmol/L — AB (ref 22–32)
CREATININE: 1.57 mg/dL — AB (ref 0.61–1.24)
GFR, EST AFRICAN AMERICAN: 43 mL/min — AB (ref >60)
GFR, EST NON AFRICAN AMERICAN: 37 mL/min — AB (ref >60)
Glucose, Bld: 182 mg/dL — ABNORMAL HIGH (ref 65–99)
POTASSIUM: 5 mmol/L (ref 3.5–5.1)
Sodium: 140 mmol/L (ref 135–145)

## 2015-09-19 LAB — GLUCOSE, CAPILLARY
GLUCOSE-CAPILLARY: 169 mg/dL — AB (ref 65–99)
GLUCOSE-CAPILLARY: 165 mg/dL — AB (ref 65–99)
Glucose-Capillary: 135 mg/dL — ABNORMAL HIGH (ref 65–99)
Glucose-Capillary: 132 mg/dL — ABNORMAL HIGH (ref 65–99)

## 2015-09-19 LAB — CK: CK TOTAL: 31 U/L — AB (ref 49–397)

## 2015-09-19 LAB — SEDIMENTATION RATE: SED RATE: 5 mm/h (ref 0–16)

## 2015-09-19 MED ORDER — INSULIN ASPART 100 UNIT/ML ~~LOC~~ SOLN
4.0000 [IU] | Freq: Three times a day (TID) | SUBCUTANEOUS | Status: DC
  Administered 2015-09-19 – 2015-09-23 (×7): 4 [IU] via SUBCUTANEOUS

## 2015-09-19 MED ORDER — INSULIN ASPART 100 UNIT/ML ~~LOC~~ SOLN
0.0000 [IU] | Freq: Three times a day (TID) | SUBCUTANEOUS | Status: DC
  Administered 2015-09-19 (×2): 3 [IU] via SUBCUTANEOUS
  Administered 2015-09-22 (×2): 2 [IU] via SUBCUTANEOUS

## 2015-09-19 MED ORDER — FUROSEMIDE 10 MG/ML IJ SOLN
40.0000 mg | Freq: Once | INTRAMUSCULAR | Status: AC
  Administered 2015-09-19: 40 mg via INTRAVENOUS
  Filled 2015-09-19: qty 4

## 2015-09-19 MED ORDER — INSULIN ASPART 100 UNIT/ML ~~LOC~~ SOLN: 0.0000 [IU] | Freq: Every day | SUBCUTANEOUS | Status: DC

## 2015-09-19 NOTE — Patient Outreach (Signed)
Case Closure: [facility] community case management has closed case as patient has been admitted greater than 10 days. [facility] hospital liaisons will continue to follow while inpatient.   PLAN: Will reopen if needed. [facility] hospital liaisons will continue to follow.  Tomasa Rand, RN, BSN, CEN [facility] [employer] [phone]

## 2015-09-19 NOTE — Progress Notes (Signed)
Progress Note    [name]  [id number] DOB: 1922/07/02  DOA: 09/09/2015 PCP: Binnie Rail, MD    Brief Narrative:   Chief complaint: Follow-up left leg pain and swelling.  [name] is an 82 y.o. 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 Benay Spice, recurrent cellulitis of left leg with history of sepsis/septic shock. His recent flare was treated with doxycycline, but due to failure of outpatient therapy he was referred for evaluation given his high risk of deterioration secondary to pancytopenia.  Assessment/Plan:   Principal Problem:   Left leg cellulitis/Recurrent cellulitis of lower leg in the setting of a lymphoproliferative active disorder and pancytopenia Being followed by ID who recommends ongoing treatment with vancomycin and Cefepime which was added 09/15/15. Blood cultures negative. MRI done 09/13/15 negative for abscess or osteomyelitis. Discussed case with Dr. Johnnye Sima who recommends repeat imaging so will obtain a repeat MRI of the left ankle, tibia and fibula. Will also check ESR and CK. Lactic acid remains persistently elevated, but doubt related to infection (see below).  Lamisil cream ordered to bilateral feet for Candida as this is likely serving as the portal of entry.Overall, the left leg/foot is more swollen today.  Active Problems:   Hyperkalemia/metabolic acidosis/lactic acidosis Metabolic acidosis is likely from lactic acidosis. Bicarbonate stable at 19. I suspect that persistent lactate elevation is non-hypoxic and may be related to renal disease or underlying malignancy (so called type B lactic acidosis---the treatment  of which is to treat the underlying malignancy). Thiamine deficiency also in the differential. Continue thiamine supplementation and oral bicarbonate. Discontinue IV fluids. Potassium normalized today, no need for Kayexalate.    Type 2 diabetes mellitus with complication (HCC) Diabetes currently uncontrolled, likely secondary to steroids. Currently being managed with Levemir 25 units daily and moderate scale SSI QAC/HS. CBGs 135-288. Add 4 units of meal coverage.    Gout Continue Solu-Medrol to 40 mg daily. Continue oxycodone for pain control.    Essential hypertension Continue propranolol.    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 Discussed with Dr. Benay Spice who confirms that the lactic acid elevation may be related to his bone marrow process, versus liver disease (Dr. Benay Spice suspects underlying cirrhosis). He has had multiple bone marrow biopsies that have not revealed a specific etiology of his bone marrow failure. Most recently, he refused an additional bone marrow biopsy.  Dr. Benay Spice will evaluate the patient in consultation on 09/20/15.    Hypothyroidism Continue Synthroid.    Paroxysmal atrial fibrillation (HCC) Continue Pacerone.    CKD (chronic kidney disease), stage III Continue to monitor creatinine. Baseline creatinine 1.6-1.9. Current creatinine consistent with usual baseline values. Continue to monitor closely for deterioration of renal function while on vancomycin.    Depression Continue Zoloft.    S/P TAVR (transcatheter aortic valve replacement)    GERD (gastroesophageal reflux disease) No complaints of heartburn.    Chronic diastolic congestive heart failure (HCC) No complaints of shortness of breath.  Family Communication/Anticipated D/C date and plan/Code Status   DVT prophylaxis: SCDs given thrombocytopenia. Code Status: Limited  Code.  Family Communication: Daughter updated at the bedside.  Disposition Plan: Possibly home tomorrow depending on clearance from ID and hematology/oncology.   Medical Consultants:    Infectious Disease  Oncology   Procedures:    None  Anti-Infectives:    Vancomycin 09/09/15--->  Cefepime 09/15/15--->   Subjective:   Patient reports worsening abdominal/lower extremity swelling and a new right inguinal hernia. He continues to have moderate left foot/ankle pain that is worse with standing. Review of symptoms is negative for fever, chills, nausea and vomiting, chest pain.   Objective:    Vitals:   09/18/15 1335 09/18/15 2221 09/19/15 0635 09/19/15 1231  BP: 128/72 133/73 125/76 (!) 175/88  Pulse: 86 (!) 106 82 91  Resp: 18   16  Temp: 97.8 F (36.6 C) 97.6 F (36.4 C) 97.8 F (36.6 C) 97.8 F (36.6 C)  TempSrc: Oral Oral Oral Oral  SpO2: 99% 98% 100% 100%  Weight:      Height:        Intake/Output Summary (Last 24 hours) at 09/19/15 1430 Last data filed at 09/19/15 1145  Gross per 24 hour  Intake              240 ml  Output              582 ml  Net             -342 ml   Filed Weights   09/09/15 1042 09/10/15 0513 09/11/15 0800  Weight: 78.5 kg (173 lb) 78.4 kg (172 lb 13.5 oz) 78.4 kg (172 lb 14.4 oz)    Exam: General exam: Appears calm and comfortable.  Respiratory system: Clear to auscultation. Respiratory effort normal. Cardiovascular system: S1 & S2 heard, HSIR. No JVD,  rubs, gallops or clicks. No murmurs. Gastrointestinal system: Abdomen is mildly distended. Right inguinal hernia that reduces with pressure. Normal bowel sounds heard. Central nervous system: Alert and oriented. No focal neurological deficits. Extremities: BLE swelling appears worse today. Skin: Extensive ecchymosis on all extremities.[name] Kitchen Psychiatry: Judgement and insight appear normal. Mood & affect mildly depressed.        Data Reviewed:   I have personally reviewed  following labs and imaging studies:  Labs: Basic Metabolic Panel:  Recent Labs Lab 09/15/15 0419 09/16/15 0501 09/17/15 0409 09/18/15 0401 09/19/15 1116  NA 136 136 139 139 140  K 5.5* 5.2* 5.3* 5.2* 5.0  CL 109 109 112* 113* 111  CO2 19* 18* 19* 19* 19*  GLUCOSE 221* 164* 170* 188* 182*  BUN 66* 66* 68* 73* 66*  CREATININE 1.72* 1.53* 1.54* 1.67* 1.57*  CALCIUM 9.1 8.7* 8.8* 8.8* 8.6*   GFR Estimated Creatinine Clearance: 31.6 mL/min (by C-G formula based on SCr of 1.57 mg/dL). Liver Function Tests:  Recent Labs Lab 09/13/15 0453 09/14/15 0549  AST 29 37  ALT 29 35  ALKPHOS 96 116  BILITOT 1.0 1.0  PROT 5.2* 5.0*  ALBUMIN 2.8* 2.8*   CBC:  Recent Labs Lab 09/13/15 0453 09/14/15 0549 09/15/15 0419 09/16/15 0501 09/17/15 0409 09/18/15 0401  WBC 2.0* 2.3* 3.5* 2.5* 1.7* 2.5*  NEUTROABS 1.6* 2.1 3.2  --   --   --   HGB 8.8* 8.7* 8.9* 8.4* 7.6* 8.5*  HCT 28.2* 27.8* 28.6* 26.5* 24.0* 27.1*  MCV 91.0 89.7 89.1 89.5 87.9 89.7  PLT 22* 29* 30* 28* 30* 37*   CBG:  Recent Labs Lab 09/18/15 1443 09/18/15 1630 09/18/15 2228 09/19/15 0638 09/19/15 1117  GLUCAP 288* 229* 199* 135* 169*   Sepsis Labs:  Recent  Labs Lab 09/15/15 0419 09/15/15 0848 09/16/15 0501 09/16/15 0923 09/17/15 0409 09/17/15 1114 09/18/15 0401 09/18/15 0937  WBC 3.5*  --  2.5*  --  1.7*  --  2.5*  --   LATICACIDVEN  --  2.5*  --  2.0*  --  2.4*  --  3.2*    Microbiology Recent Results (from the past 240 hour(s))  Culture, Urine     Status: Abnormal   Collection Time: 09/10/15  1:10 PM  Result Value Ref Range Status   Specimen Description URINE, RANDOM  Final   Special Requests NONE  Final   Culture <10,000 COLONIES/mL INSIGNIFICANT GROWTH (A)  Final   Report Status 09/11/2015 FINAL  Final    Radiology: No results found.  Medications:   . amiodarone  200 mg Oral Daily  . ceFEPime (MAXIPIME) IV  1 g Intravenous Q24H  . insulin aspart  0-15 Units Subcutaneous TID WC   . insulin aspart  0-5 Units Subcutaneous QHS  . insulin aspart  4 Units Subcutaneous TID WC  . insulin detemir  25 Units Subcutaneous Daily  . levothyroxine  25 mcg Oral QAC breakfast  . methylPREDNISolone (SOLU-MEDROL) injection  40 mg Intravenous Daily  . polyethylene glycol  17 g Oral Daily  . propranolol  5 mg Oral BID  . sertraline  50 mg Oral QHS  . sodium bicarbonate  1,300 mg Oral BID  . terbinafine   Topical BID  . thiamine  100 mg Oral Daily  . vancomycin  750 mg Intravenous Q24H   Continuous Infusions: . sodium chloride 100 mL/hr at 09/18/15 0900    Medical decision making remains of high complexity and this patient is at high risk of deterioration, therefore this is a level 3 visit.    LOS: 9 days   [place] [facility] Pager [phone]. If unable to reach me by pager, please call my cell phone at [phone].  *Please refer to [email or web address], password [email or web address] to get updated schedule on who will round on this patient, as hospitalists switch teams weekly. If 7PM-7AM, please contact night-coverage at [email or web address], password [email or web address] for any overnight needs.  09/19/2015, 2:30 PM

## 2015-09-19 NOTE — Progress Notes (Signed)
Patient states his vision in rt eye is all clear now, the blurriness last just a few minutes

## 2015-09-19 NOTE — Progress Notes (Signed)
Paged Dr Rockne Menghini, patient stated he was having trouble seeing out of right eye, top part was like lightning striking bottom part of vision was normal

## 2015-09-19 NOTE — Telephone Encounter (Signed)
09/20/2015 Appointments canceled per patient and wife request. Patient has been hospitalized since 09/09/2015 and is still in at [facility].

## 2015-09-19 NOTE — Progress Notes (Signed)
Pharmacy Antibiotic Note  [name] is a 82 y.o. male admitted on 09/09/2015 with LLE cellulitis.  Pharmacy has been consulted for Vancomycin dosing.   Day #11 of vancomycin, #5 of cefepime for cellulitis vs gout. Afebrile, WBC low but stable. LA trending up to 3.2. Was on Keflex and doxy PTA. ID changed allopurinol to colchicine. ID states foot has made some progress  Plan: Continue vancomycin 750mg  IV Q24 Continue cefepime 1g q24h per MD Monitor clinical picture, renal function, VT prn F/U abx deescalation, LOT?, ID recs  Will plan to check VT soon if continuing vancomycin  Height: 5\' 10"  (177.8 cm) Weight: 172 lb 14.4 oz (78.4 kg) IBW/kg (Calculated) : 73  Temp (24hrs), Avg:97.7 F (36.5 C), Min:97.6 F (36.4 C), Max:97.8 F (36.6 C)   Recent Labs Lab 09/13/15 1216 09/14/15 0549 09/15/15 0419 09/15/15 0848 09/16/15 0501 09/16/15 0923 09/17/15 0409 09/17/15 1114 09/18/15 0401 09/18/15 0937  WBC  --  2.3* 3.5*  --  2.5*  --  1.7*  --  2.5*  --   CREATININE  --  1.61* 1.72*  --  1.53*  --  1.54*  --  1.67*  --   LATICACIDVEN  --   --   --  2.5*  --  2.0*  --  2.4*  --  3.2*  VANCOTROUGH 14*  --   --   --   --   --   --   --   --   --     Estimated Creatinine Clearance: 29.7 mL/min (by C-G formula based on SCr of 1.67 mg/dL).    Allergies  Allergen Reactions  . Penicillins Other (See Comments)    08/15/2015:  Tolerated Cefepime Does not remember rxn (~1950) Has patient had a PCN reaction causing immediate rash, facial/tongue/throat swelling, SOB or lightheadedness with hypotension:YES Has patient had a PCN reaction causing severe rash involving mucus membranes or skin necrosis: NO Has patient had a PCN reaction that required hospitalization NO Has patient had a PCN reaction occurring within the last 10 years: NO If all of the above answers are "NO", then may proceed with Cephalosporin use.  [name] Kitchen Neomycin-Bacitracin Zn-Polymyx Rash and Other (See Comments)    ?  Reaction (thinks he remembers redness)    Antimicrobials this admission: Vanc 9/1 >> Cefepime 9/7 >>  Dose adjustments this admission: 9/5 VT = 14  Microbiology results: 9/3 BCx x2: ng F 9/2 UCx: insignificant growth 9/1 UCx: mult species  Thank you for allowing pharmacy to be a part of this patient's care.  Elenor Quinones, PharmD, BCPS Clinical Pharmacist Pager [phone] 09/19/2015 8:12 AM

## 2015-09-19 NOTE — Care Management Important Message (Signed)
Important Message  Patient Details  Name: [name] MRN: [id number] Date of Birth: Apr 13, 1922   Medicare Important Message Given:  Yes    Charmon Thorson Montine Circle 09/19/2015, 3:06 PM

## 2015-09-19 NOTE — Progress Notes (Signed)
INFECTIOUS DISEASE PROGRESS NOTE  ID: [name] is a 82 y.o. male with  Principal Problem:   Left leg cellulitis Active Problems:   Type 2 diabetes mellitus with complication (HCC)   Gout   Essential hypertension   Other pancytopenia (HCC)   Hypothyroidism   Paroxysmal atrial fibrillation (HCC)   CKD (chronic kidney disease), stage III   Depression   S/P TAVR (transcatheter aortic valve replacement)   GERD (gastroesophageal reflux disease)   Chronic diastolic congestive heart failure (HCC)   Lymphoproliferative disorder (HCC)   Recurrent cellulitis of lower leg   Anemia due to bone marrow failure (HCC)   Metabolic acidosis   Hyperkalemia   Lactic acidosis  Subjective: C/o R groin pain, LLE pain. C/o abd distension.   Abtx:  Anti-infectives    Start     Dose/Rate Route Frequency Ordered Stop   09/15/15 1300  ceFEPIme (MAXIPIME) 1 g in dextrose 5 % 50 mL IVPB     1 g 100 mL/hr over 30 Minutes Intravenous Every 24 hours 09/15/15 1226     09/10/15 1300  vancomycin (VANCOCIN) IVPB 750 mg/150 ml premix     750 mg 150 mL/hr over 60 Minutes Intravenous Every 24 hours 09/09/15 1239     09/09/15 1200  vancomycin (VANCOCIN) 1,500 mg in sodium chloride 0.9 % 500 mL IVPB     1,500 mg 250 mL/hr over 120 Minutes Intravenous  Once 09/09/15 1157 09/09/15 1432   09/09/15 1145  vancomycin (VANCOCIN) IVPB 1000 mg/200 mL premix  Status:  Discontinued     1,000 mg 200 mL/hr over 60 Minutes Intravenous  Once 09/09/15 1142 09/09/15 1142      Medications:  Scheduled: . amiodarone  200 mg Oral Daily  . ceFEPime (MAXIPIME) IV  1 g Intravenous Q24H  . furosemide  40 mg Intravenous Once  . insulin aspart  0-15 Units Subcutaneous TID WC  . insulin aspart  0-5 Units Subcutaneous QHS  . insulin aspart  4 Units Subcutaneous TID WC  . insulin detemir  25 Units Subcutaneous Daily  . levothyroxine  25 mcg Oral QAC breakfast  . methylPREDNISolone (SOLU-MEDROL) injection  40 mg Intravenous  Daily  . polyethylene glycol  17 g Oral Daily  . propranolol  5 mg Oral BID  . sertraline  50 mg Oral QHS  . sodium bicarbonate  1,300 mg Oral BID  . terbinafine   Topical BID  . thiamine  100 mg Oral Daily  . vancomycin  750 mg Intravenous Q24H    Objective: Vital signs in last 24 hours: Temp:  [97.6 F (36.4 C)-97.8 F (36.6 C)] 97.8 F (36.6 C) (09/11 1231) Pulse Rate:  [82-106] 91 (09/11 1231) Resp:  [16] 16 (09/11 1231) BP: (125-175)/(73-88) 175/88 (09/11 1231) SpO2:  [98 %-100 %] 100 % (09/11 1231)   General appearance: alert, cooperative and no distress Resp: clear to auscultation bilaterally Cardio: regular rate and rhythm GI: normal findings: bowel sounds normal and soft, non-tender and abnormal findings:  distended Extremities: LLE warmth nearly = RLE. 2+ edema BLE.   Tender hernia L groin.    Lab Results  Recent Labs  09/17/15 0409 09/18/15 0401 09/19/15 1116  WBC 1.7* 2.5*  --   HGB 7.6* 8.5*  --   HCT 24.0* 27.1*  --   NA 139 139 140  K 5.3* 5.2* 5.0  CL 112* 113* 111  CO2 19* 19* 19*  BUN 68* 73* 66*  CREATININE 1.54* 1.67* 1.57*  Liver Panel No results for input(s): PROT, ALBUMIN, AST, ALT, ALKPHOS, BILITOT, BILIDIR, IBILI in the last 72 hours. Sedimentation Rate No results for input(s): ESRSEDRATE in the last 72 hours. C-Reactive Protein No results for input(s): CRP in the last 72 hours.  Microbiology: Recent Results (from the past 240 hour(s))  Culture, Urine     Status: Abnormal   Collection Time: 09/10/15  1:10 PM  Result Value Ref Range Status   Specimen Description URINE, RANDOM  Final   Special Requests NONE  Final   Culture <10,000 COLONIES/mL INSIGNIFICANT GROWTH (A)  Final   Report Status 09/11/2015 FINAL  Final    Studies/Results: No results found.   Assessment/Plan: Cellulitis (+/- gout) Cytopenias (chronic, unchanged) R inguinal hernia  Total days of antibiotics: 9 vanco, 3 cefepime  To get MRI of his leg due to  persistent pain To restart diuresis To get u/s of R groin to eval his hernia.         Bobby Rumpf Infectious Diseases (pager) [phone] [email or web address] 09/19/2015, 2:57 PM  LOS: 9 days

## 2015-09-19 NOTE — Progress Notes (Signed)
Dr Rama called back, stated to let her know if he has blurry vision again

## 2015-09-20 ENCOUNTER — Other Ambulatory Visit: Payer: Commercial Managed Care - HMO

## 2015-09-20 ENCOUNTER — Ambulatory Visit: Payer: Commercial Managed Care - HMO | Admitting: Nurse Practitioner

## 2015-09-20 DIAGNOSIS — D479 Neoplasm of uncertain behavior of lymphoid, hematopoietic and related tissue, unspecified: Secondary | ICD-10-CM

## 2015-09-20 DIAGNOSIS — K769 Liver disease, unspecified: Secondary | ICD-10-CM

## 2015-09-20 DIAGNOSIS — M659 Synovitis and tenosynovitis, unspecified: Secondary | ICD-10-CM | POA: Diagnosis present

## 2015-09-20 DIAGNOSIS — D469 Myelodysplastic syndrome, unspecified: Secondary | ICD-10-CM

## 2015-09-20 DIAGNOSIS — R161 Splenomegaly, not elsewhere classified: Secondary | ICD-10-CM

## 2015-09-20 DIAGNOSIS — M6588 Other synovitis and tenosynovitis, other site: Secondary | ICD-10-CM

## 2015-09-20 DIAGNOSIS — N432 Other hydrocele: Secondary | ICD-10-CM

## 2015-09-20 DIAGNOSIS — M79605 Pain in left leg: Secondary | ICD-10-CM

## 2015-09-20 LAB — BASIC METABOLIC PANEL
Anion gap: 10 (ref 5–15)
BUN: 70 mg/dL — AB (ref 6–20)
CALCIUM: 8.4 mg/dL — AB (ref 8.9–10.3)
CO2: 21 mmol/L — ABNORMAL LOW (ref 22–32)
CREATININE: 1.65 mg/dL — AB (ref 0.61–1.24)
Chloride: 110 mmol/L (ref 101–111)
GFR calc Af Amer: 40 mL/min — ABNORMAL LOW (ref >60)
GFR, EST NON AFRICAN AMERICAN: 35 mL/min — AB (ref >60)
GLUCOSE: 95 mg/dL (ref 65–99)
Potassium: 4.9 mmol/L (ref 3.5–5.1)
Sodium: 141 mmol/L (ref 135–145)

## 2015-09-20 LAB — GLUCOSE, CAPILLARY
Glucose-Capillary: 93 mg/dL (ref 65–99)
Glucose-Capillary: 94 mg/dL (ref 65–99)
Glucose-Capillary: 110 mg/dL — ABNORMAL HIGH (ref 65–99)
Glucose-Capillary: 176 mg/dL — ABNORMAL HIGH (ref 65–99)

## 2015-09-20 MED ORDER — FUROSEMIDE 40 MG PO TABS
80.0000 mg | ORAL_TABLET | Freq: Every day | ORAL | Status: DC
  Administered 2015-09-20 – 2015-09-22 (×2): 80 mg via ORAL
  Filled 2015-09-20 (×3): qty 2

## 2015-09-20 MED ORDER — INSULIN DETEMIR 100 UNIT/ML ~~LOC~~ SOLN
15.0000 [IU] | Freq: Every day | SUBCUTANEOUS | Status: DC
  Administered 2015-09-20 – 2015-09-23 (×4): 15 [IU] via SUBCUTANEOUS
  Filled 2015-09-20 (×4): qty 0.15

## 2015-09-20 NOTE — Progress Notes (Signed)
IP PROGRESS NOTE  Subjective:   [name] is well-known to me with a history of chronic pancytopenia felt to be secondary to liver disease versus a chronic lymphoproliferative or myeloproliferative disorder. He has declined a repeat diagnostic bone marrow biopsy. He was admitted on 09/09/2015 with recurrent left lower extremity cellulitis and pain at the left ankle. He reports improvement in in the left leg erythema and warmth. He continues to have pain at the left ankle, worse with weightbearing.  Objective: Vital signs in last 24 hours: Blood pressure 119/61, pulse 83, temperature 97.8 F (36.6 C), temperature source Oral, resp. rate 16, height _0  (1.778 m), weight 172 lb 14.4 oz (78.4 kg), SpO2 100 %.  Intake/Output from previous day: 09/11 0701 - 09/12 0700 In: 630 [P.O.:480; IV Piggyback:150] Out: 3200 [Urine:3200]  Physical Exam:  HEENT: No thrush or ulcers. No bleeding. Lungs: Rhonchi at the left upper anterior chest, no respiratory distress Cardiac: Irregular Abdomen: The spleen is palpable in the left abdomen, nontender, right inguinal hernia Extremities: Trace low leg and foot edema bilaterally Musculoskeletal: Pain with palpation and motion at the left ankle. No apparent effusion. Skin: Diffuse dark brown discoloration secondary to repeat ecchymoses, no erythema at the left lower leg, yeast rash in the left greater than right groin    Lab Results:  Recent Labs  09/18/15 0401  WBC 2.5*  HGB 8.5*  HCT 27.1*  PLT 37*    BMET  Recent Labs  09/19/15 1116 09/20/15 0607  NA 140 141  K 5.0 4.9  CL 111 110  CO2 19* 21*  GLUCOSE 182* 95  BUN 66* 70*  CREATININE 1.57* 1.65*  CALCIUM 8.6* 8.4*    Studies/Results: US Pelvis Limited  Result Date: 09/19/2015 CLINICAL DATA:  Complains of pain in the right inguinal canal for 1 day. History of hernia repair. Left testicle removed. EXAM: LIMITED ULTRASOUND OF PELVIS TECHNIQUE: Limited transabdominal ultrasound  examination of the pelvis was performed. COMPARISON:  CT scan 05/18/2015 FINDINGS: Limited ultrasound of the right inguinal region performed. No herniated bowel loops visualized within the right inguinal canal. No focal fluid collections. Incidentally noted is a small hydrocele in the right scrotal sac. IMPRESSION: 1. No scintigraphic evidence for bowel containing right inguinal hernia 2. Incidental note made of small right hydrocele Electronically Signed   By: Donavan Foil M.D.   On: 09/19/2015 19:29   [name]  Result Date: 09/20/2015 CLINICAL DATA:  Left leg cellulitis in patient with lymphoproliferative disorder and pancytopenia. Question abscess or osteomyelitis. EXAM: MRI OF LOWER LEFT EXTREMITY WITHOUT Walton TECHNIQUE: Multiplanar, multisequence Walter imaging of the left lower leg was performed. No intravenous Walton was administered. COMPARISON:  MRI left lower leg 08/14/2015 in 09/13/2015. FINDINGS: Bones/Joint/Cartilage The axial and coronal sequences incidentally include the right lower leg. Bone marrow signal is normal bilaterally without evidence of osteomyelitis or worrisome marrow lesion. Ligaments Limited visualization demonstrates no abnormality. Muscles and Tendons Mild to moderate fatty atrophy of lower leg musculature bilaterally appears symmetric. Intermediate increased T2 signal in lower leg musculature and is now diffuse compared to the prior study with marked increase on the right seen. No focal fluid collection is identified. Soft tissues Extensive subcutaneous edema is seen about the lower legs bilaterally. Changes appear fairly symmetric and have worsened on the right since the prior study. IMPRESSION: Increased subcutaneous and intramuscular edema about both lower legs since the prior examination. Findings could be due to cellulitis and myositis. Dependent change  causing subcutaneous edema and rhabdomyolysis causing intramuscular edema are also  considerations. Negative for abscess or osteomyelitis. Electronically Signed   By: Inge Rise M.D.   On: 09/20/2015 08:08   Walter Ankle Left W Wo Walton  Result Date: 09/20/2015 CLINICAL DATA:  Recurrent left lower extremity cellulitis and patient with lymphoproliferative disease and pancytopenia. EXAM: MRI OF THE LEFT ANKLE WITHOUT Walton TECHNIQUE: Multiplanar, multisequence Walter imaging of the ankle was performed. No intravenous Walton was administered. COMPARISON:  MRI left ankle 09/13/2015. MRI left lower leg 09/13/2015 and this same day. FINDINGS: Subcutaneous edema without focal fluid collection is seen about the ankle and imaged foot. TENDONS Peroneal: Intact. Posteromedial: Intact. A small amount of fluid about the posteromedial tendons is not notably changed. Anterior: Intact. Achilles: Intact. Plantar Fascia: Unremarkable. LIGAMENTS Lateral: Intact. Medial: Intact. CARTILAGE Ankle Joint: Unremarkable. Subtalar Joints/Sinus Tarsi: Unremarkable. Bones: Normal marrow signal throughout. Other: None. IMPRESSION: Subcutaneous edema about the ankle and foot could be due to dependent change or cellulitis. Negative for abscess or osteomyelitis. Small amount of fluid about the posteromedial tendons is compatible with tenosynovitis and unchanged. Mild edema in the flexor hallucis longus muscle as seen on MRI left lower leg this same day. Electronically Signed   By: Inge Rise M.D.   On: 09/20/2015 08:17    Medications: I have reviewed the patient's current medications.  Assessment/Plan:  1. Chronic pancytopenia secondary to chronic liver disease versus a chronic lymphoproliferative disorder-stable  2. History of severe aortic stenosis, status post aortic valve replacement surgery at [facility] on 01/23/2011, status post transcatheter aortic valve replacement 05/14/2014  3.  Diabetes  4.  History of Streptococcus bacteremia  5.  Renal insufficiency  6.  Paroxysmal atrial fibrillation  7.   Admission with urosepsis May 2017  8.  Admission with Escherichia coli sepsis and left lower extremity cellulitis August 2017  9.  Admission with recurrent left lower extremity cellulitis 09/09/2015  10. Pain at the left ankle-gout?, Negative MRI evaluation for osteomyelitis  11. Right inguinal hernia  Walter. Snavely appears to be recovering from the admission with cellulitis. The left ankle pain is most likely related to gout. No evidence of infection or hemorrhage on the ankle MRI.  He is undoubtedly immunocompromised secondary to age, pancytopenia, probable chronic liver disease, and probable venous stasis in the lower extremities.  He has chronic pancytopenia with associated splenomegaly. This may be related to chronic liver disease or a lymphoproliferative/myeloproliferative disorder. He has declined a repeat bone marrow biopsy. He also declined another trial of erythropoietin therapy.  The elevated lactic acid is likely related to chronic liver disease as opposed to sepsis/hypoperfusion.  Regulations: 1. Antibiotics for treatment of the cellulitis as recommended by infectious disease 2. Outpatient follow-up will be scheduled at the [facility] in approximately 1 month.  Please call as needed.  Recommendations:   LOS: 10 days   Betsy Coder, MD   09/20/2015, 8:41 AM

## 2015-09-20 NOTE — Progress Notes (Signed)
Progress Note    [name]  CHE:[id number] DOB: 29-Apr-1922  DOA: 09/09/2015 PCP: Binnie Rail, MD    Brief Narrative:   Chief complaint: Follow-up left leg pain and swelling.  [name] is an 82 y.o. male with a PMH of severe AS--> S/P Aortic valve replacement surgery at [facility] on 01/23/2011 (porcine valve); S/P transcatheter aortic valve replacement Trezevant 05/14/2014 (bovine valve) , chronic diastolic CHF, PAF not on anticoagulation, bradycardia, QT prolongation, CKD (chronic kidney disease), stage III, Lymphoproliferative DO, throat cancer , severe pancytopenia followed by Dr. Benay Spice, recurrent cellulitis of left leg with history of sepsis/septic shock. His recent flare was treated with doxycycline, but due to failure of outpatient therapy he was referred for evaluation given his high risk of deterioration secondary to pancytopenia.  Assessment/Plan:   Principal Problem:   Left leg cellulitis/Recurrent cellulitis/Tenosynovitis of lower leg in the setting of a lymphoproliferative active disorder and pancytopenia Being followed by ID who recommends ongoing treatment with vancomycin and Cefepime which was added 09/15/15. Blood cultures negative. MRI done 09/13/15 negative for abscess or osteomyelitis, repeated 09/19/15 which was again negative for abscess or osteomyelitis. ESR and CK is not elevated. Lactic acid remains persistently elevated, but doubt related to infection (see below).  Lamisil cream ordered to bilateral feet for Candida as this is likely serving as the portal of entry.  Active Problems:   Hyperkalemia/metabolic acidosis/lactic acidosis Metabolic acidosis is likely from lactic acidosis. Bicarbonate stable at 21. I suspect that persistent lactate elevation is non-hypoxic and may be related to renal disease or underlying malignancy (so called type B lactic acidosis---the treatment of which is to treat the underlying malignancy). Thiamine deficiency also in the  differential. Continue thiamine supplementation and oral bicarbonate. Potassium remains normal after multiple doses of Kayexalate.    Type 2 diabetes mellitus with complication (HCC) Diabetes currently uncontrolled, likely secondary to steroids. Currently being managed with Levemir 25 units daily and moderate scale SSI QAC/HS with 4 units of meal coverage. CBGs 93-169. Will decrease Levemir to 15 units given that we are stopping Solu-Medrol today to avoid hypoglycemia.    Gout Discontinue Solu-Medrol. Continue oxycodone for pain control.    Essential hypertension Continue propranolol.    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 Discussed with Dr. Benay Spice who confirms that the lactic acid elevation may be related to his bone marrow process, versus liver disease (Dr. Benay Spice suspects underlying cirrhosis). He has had multiple bone marrow biopsies that have not revealed a specific etiology of his bone marrow failure. Most recently, he refused an additional bone marrow biopsy.  Dr. Benay Spice saw the patient in consultation on 09/20/15. He will follow-up with him as an outpatient post discharge.    Hypothyroidism Continue Synthroid.    Paroxysmal atrial fibrillation (HCC) Continue Pacerone.    CKD (chronic kidney disease), stage III Continue to monitor creatinine. Baseline creatinine 1.6-1.9. Current creatinine consistent with usual baseline values. Continue to monitor closely for deterioration of renal function while on vancomycin.    Depression Continue Zoloft.    S/P TAVR (transcatheter aortic valve replacement)    GERD (gastroesophageal reflux disease) No complaints of heartburn.    Chronic diastolic congestive heart failure (HCC) No complaints of shortness of breath. Discussed the patient's care with Dr. Burt Knack, his primary cardiologist by telephone due to  concerns that his Lasix dose was causing his problem with gout. Based on  our conversation, will resume Lasix at 80 mg daily, as the patient has a history of significant fluid retention and decompensated heart failure without treatment with a diuretic.    Right groin swelling Likely from edema, status post ultrasound which was negative for inguinal hernia. I have personally reviewed these images.  Family Communication/Anticipated D/C date and plan/Code Status   DVT prophylaxis: SCDs given thrombocytopenia. Code Status: Limited Code.  Family Communication: Daughter updated at the bedside. Disposition Plan: Possibly home tomorrow after he completes his course of IV antibiotics.   Medical Consultants:    Infectious Disease  Oncology   Procedures:    None  Anti-Infectives:    Vancomycin 09/09/15--->  Cefepime 09/15/15--->   Subjective:   Patient reports Ongoing peripheral edema. He continues to have moderate left foot/ankle pain that is worse with standing, very slow to improve. Review of symptoms is negative for fever, chills, nausea and vomiting, chest pain.   Objective:    Vitals:   09/19/15 1231 09/19/15 2155 09/20/15 0605 09/20/15 0900  BP: (!) 175/88 112/70 119/61 129/78  Pulse: 91 100 83 91  Resp: _0 Temp: 97.8 F (36.6 C) 98.1 F (36.7 C) 97.8 F (36.6 C) 97.8 F (36.6 C)  TempSrc: Oral Oral Oral Oral  SpO2: 100% 100% 100%   Weight:      Height:        Intake/Output Summary (Last 24 hours) at 09/20/15 0928 Last data filed at 09/20/15 0900  Gross per 24 hour  Intake             1198 ml  Output             3400 ml  Net            -2202 ml   Filed Weights   09/09/15 1042 09/10/15 0513 09/11/15 0800  Weight: 78.5 kg (173 lb) 78.4 kg (172 lb 13.5 oz) 78.4 kg (172 lb 14.4 oz)    Exam: General exam: Appears calm and comfortable.  Respiratory system: Clear to auscultation. Respiratory effort normal. Cardiovascular system: S1 & S2 heard, HSIR.  No JVD,  rubs, gallops or clicks. No murmurs. Gastrointestinal system: Abdomen is mildly distended. Right inguinal hernia that reduces with pressure. Normal bowel sounds heard. Central nervous system: Alert and oriented. No focal neurological deficits. Extremities: BLE swelling appears similar to today. Skin: Extensive ecchymosis on all extremities.[name] Kitchen Psychiatry: Judgement and insight appear normal. Mood & affect mildly depressed.   Data Reviewed:   I have personally reviewed following labs and imaging studies:  Labs: Basic Metabolic Panel:  Recent Labs Lab 09/16/15 0501 09/17/15 0409 09/18/15 0401 09/19/15 1116 09/20/15 0607  NA 136 139 139 140 141  K 5.2* 5.3* 5.2* 5.0 4.9  CL 109 112* 113* 111 110  CO2 18* 19* 19* 19* 21*  GLUCOSE 164* 170* 188* 182* 95  BUN 66* 68* 73* 66* 70*  CREATININE 1.53* 1.54* 1.67* 1.57* 1.65*  CALCIUM 8.7* 8.8* 8.8* 8.6* 8.4*   GFR Estimated Creatinine Clearance: 30.1 mL/min (by C-G formula based on SCr of 1.65 mg/dL). Liver Function Tests:  Recent Labs Lab 09/14/15 0549  AST 37  ALT 35  ALKPHOS 116  BILITOT 1.0  PROT 5.0*  ALBUMIN 2.8*   CBC:  Recent Labs Lab 09/14/15 0549 09/15/15 0419 09/16/15 0501 09/17/15 0409 09/18/15 0401  WBC 2.3* 3.5* 2.5* 1.7* 2.5*  NEUTROABS 2.1 3.2  --   --   --   HGB 8.7* 8.9* 8.4*  7.6* 8.5*  HCT 27.8* 28.6* 26.5* 24.0* 27.1*  MCV 89.7 89.1 89.5 87.9 89.7  PLT 29* 30* 28* 30* 37*   CBG:  Recent Labs Lab 09/19/15 0638 09/19/15 1117 09/19/15 1612 09/19/15 2159 09/20/15 0612  GLUCAP 135* 169* 165* 132* 93   Sepsis Labs:  Recent Labs Lab 09/15/15 0419 09/15/15 0848 09/16/15 0501 09/16/15 0923 09/17/15 0409 09/17/15 1114 09/18/15 0401 09/18/15 0937  WBC 3.5*  --  2.5*  --  1.7*  --  2.5*  --   LATICACIDVEN  --  2.5*  --  2.0*  --  2.4*  --  3.2*    Microbiology Recent Results (from the past 240 hour(s))  Culture, Urine     Status: Abnormal   Collection Time: 09/10/15  1:10  PM  Result Value Ref Range Status   Specimen Description URINE, RANDOM  Final   Special Requests NONE  Final   Culture <10,000 COLONIES/mL INSIGNIFICANT GROWTH (A)  Final   Report Status 09/11/2015 FINAL  Final    Radiology: US Pelvis Limited  Result Date: 09/19/2015 CLINICAL DATA:  Complains of pain in the right inguinal canal for 1 day. History of hernia repair. Left testicle removed. EXAM: LIMITED ULTRASOUND OF PELVIS TECHNIQUE: Limited transabdominal ultrasound examination of the pelvis was performed. COMPARISON:  CT scan 05/18/2015 FINDINGS: Limited ultrasound of the right inguinal region performed. No herniated bowel loops visualized within the right inguinal canal. No focal fluid collections. Incidentally noted is a small hydrocele in the right scrotal sac. IMPRESSION: 1. No scintigraphic evidence for bowel containing right inguinal hernia 2. Incidental note made of small right hydrocele Electronically Signed   By: Donavan Foil M.D.   On: 09/19/2015 19:29   Mr Tibia Fibula Left Wo Contrast  Result Date: 09/20/2015 CLINICAL DATA:  Left leg cellulitis in patient with lymphoproliferative disorder and pancytopenia. Question abscess or osteomyelitis. EXAM: MRI OF LOWER LEFT EXTREMITY WITHOUT CONTRAST TECHNIQUE: Multiplanar, multisequence MR imaging of the left lower leg was performed. No intravenous contrast was administered. COMPARISON:  MRI left lower leg 08/14/2015 in 09/13/2015. FINDINGS: Bones/Joint/Cartilage The axial and coronal sequences incidentally include the right lower leg. Bone marrow signal is normal bilaterally without evidence of osteomyelitis or worrisome marrow lesion. Ligaments Limited visualization demonstrates no abnormality. Muscles and Tendons Mild to moderate fatty atrophy of lower leg musculature bilaterally appears symmetric. Intermediate increased T2 signal in lower leg musculature and is now diffuse compared to the prior study with marked increase on the right seen. No  focal fluid collection is identified. Soft tissues Extensive subcutaneous edema is seen about the lower legs bilaterally. Changes appear fairly symmetric and have worsened on the right since the prior study. IMPRESSION: Increased subcutaneous and intramuscular edema about both lower legs since the prior examination. Findings could be due to cellulitis and myositis. Dependent change causing subcutaneous edema and rhabdomyolysis causing intramuscular edema are also considerations. Negative for abscess or osteomyelitis. Electronically Signed   By: Inge Rise M.D.   On: 09/20/2015 08:08   Mr Ankle Left W Wo Contrast  Result Date: 09/20/2015 CLINICAL DATA:  Recurrent left lower extremity cellulitis and patient with lymphoproliferative disease and pancytopenia. EXAM: MRI OF THE LEFT ANKLE WITHOUT CONTRAST TECHNIQUE: Multiplanar, multisequence MR imaging of the ankle was performed. No intravenous contrast was administered. COMPARISON:  MRI left ankle 09/13/2015. MRI left lower leg 09/13/2015 and this same day. FINDINGS: Subcutaneous edema without focal fluid collection is seen about the ankle and imaged foot. TENDONS Peroneal: Intact. Posteromedial:  Intact. A small amount of fluid about the posteromedial tendons is not notably changed. Anterior: Intact. Achilles: Intact. Plantar Fascia: Unremarkable. LIGAMENTS Lateral: Intact. Medial: Intact. CARTILAGE Ankle Joint: Unremarkable. Subtalar Joints/Sinus Tarsi: Unremarkable. Bones: Normal marrow signal throughout. Other: None. IMPRESSION: Subcutaneous edema about the ankle and foot could be due to dependent change or cellulitis. Negative for abscess or osteomyelitis. Small amount of fluid about the posteromedial tendons is compatible with tenosynovitis and unchanged. Mild edema in the flexor hallucis longus muscle as seen on MRI left lower leg this same day. Electronically Signed   By: Inge Rise M.D.   On: 09/20/2015 08:17    Medications:   . amiodarone   200 mg Oral Daily  . ceFEPime (MAXIPIME) IV  1 g Intravenous Q24H  . insulin aspart  0-15 Units Subcutaneous TID WC  . insulin aspart  0-5 Units Subcutaneous QHS  . insulin aspart  4 Units Subcutaneous TID WC  . insulin detemir  25 Units Subcutaneous Daily  . levothyroxine  25 mcg Oral QAC breakfast  . methylPREDNISolone (SOLU-MEDROL) injection  40 mg Intravenous Daily  . polyethylene glycol  17 g Oral Daily  . propranolol  5 mg Oral BID  . sertraline  50 mg Oral QHS  . sodium bicarbonate  1,300 mg Oral BID  . terbinafine   Topical BID  . thiamine  100 mg Oral Daily  . vancomycin  750 mg Intravenous Q24H   Continuous Infusions: . sodium chloride 100 mL/hr at 09/18/15 0900    Medical decision making remains of high complexity and this patient is at high risk of deterioration, therefore this is a level 3 visit.    LOS: 10 days   [facility] Pager [phone]. If unable to reach me by pager, please call my cell phone at [phone].  *Please refer to [email or web address], password [email or web address] to get updated schedule on who will round on this patient, as hospitalists switch teams weekly. If 7PM-7AM, please contact night-coverage at [email or web address], password [email or web address] for any overnight needs.  09/20/2015, 9:28 AM

## 2015-09-20 NOTE — Consult Note (Signed)
   [facility] CM Inpatient Consult   09/20/2015  [name] 06/23/1922 [id number]   Spoke with [name] at bedside as he has been followed by [place] [facility] program. He is agreeable to ongoing follow up from [place] [facility] post hospital discharge. Will request for him to be re-assigned to [place] upon hospital discharge.   Discussed his discharge plans. [name] reports his plan is to return home with home health services. States his daughter will help out some. He states his neighbor is a retired Marine scientist and she told him that she will help him once he goes home. [name] states he will contact his neighbor once he knows more about the level of assistance he will need pertaining to iv antibiotics and the like. He is adamant about "not going to no rehab". Discussed ongoing follow up and contact information left at bedside. Inpatient RNCM aware [facility] [facility] is following.   Marthenia Rolling, MSN-Ed, RN,BSN [facility] Liaison [phone]

## 2015-09-20 NOTE — Progress Notes (Signed)
INFECTIOUS DISEASE PROGRESS NOTE  ID: [name] is a 82 y.o. male with  Principal Problem:   Left leg cellulitis Active Problems:   Type 2 diabetes mellitus with complication (HCC)   Gout   Essential hypertension   Other pancytopenia (HCC)   Hypothyroidism   Paroxysmal atrial fibrillation (HCC)   CKD (chronic kidney disease), stage III   Depression   S/P TAVR (transcatheter aortic valve replacement)   GERD (gastroesophageal reflux disease)   Chronic diastolic congestive heart failure (HCC)   Lymphoproliferative disorder (HCC)   Recurrent cellulitis of lower leg   Anemia due to bone marrow failure (HCC)   Metabolic acidosis   Hyperkalemia   Lactic acidosis   Hernia of abdominal cavity   Tenosynovitis of ankle  Subjective: C/o edema.  Has been up to bathroom today.   Abtx:  Anti-infectives    Start     Dose/Rate Route Frequency Ordered Stop   09/15/15 1300  ceFEPIme (MAXIPIME) 1 g in dextrose 5 % 50 mL IVPB     1 g 100 mL/hr over 30 Minutes Intravenous Every 24 hours 09/15/15 1226     09/10/15 1300  vancomycin (VANCOCIN) IVPB 750 mg/150 ml premix     750 mg 150 mL/hr over 60 Minutes Intravenous Every 24 hours 09/09/15 1239     09/09/15 1200  vancomycin (VANCOCIN) 1,500 mg in sodium chloride 0.9 % 500 mL IVPB     1,500 mg 250 mL/hr over 120 Minutes Intravenous  Once 09/09/15 1157 09/09/15 1432   09/09/15 1145  vancomycin (VANCOCIN) IVPB 1000 mg/200 mL premix  Status:  Discontinued     1,000 mg 200 mL/hr over 60 Minutes Intravenous  Once 09/09/15 1142 09/09/15 1142      Medications:  Scheduled: . amiodarone  200 mg Oral Daily  . ceFEPime (MAXIPIME) IV  1 g Intravenous Q24H  . insulin aspart  0-15 Units Subcutaneous TID WC  . insulin aspart  0-5 Units Subcutaneous QHS  . insulin aspart  4 Units Subcutaneous TID WC  . insulin detemir  15 Units Subcutaneous Daily  . levothyroxine  25 mcg Oral QAC breakfast  . polyethylene glycol  17 g Oral Daily  .  propranolol  5 mg Oral BID  . sertraline  50 mg Oral QHS  . sodium bicarbonate  1,300 mg Oral BID  . terbinafine   Topical BID  . thiamine  100 mg Oral Daily  . vancomycin  750 mg Intravenous Q24H    Objective: Vital signs in last 24 hours: Temp:  [97.8 F (36.6 C)-98.1 F (36.7 C)] 97.8 F (36.6 C) (09/12 0900) Pulse Rate:  [83-100] 91 (09/12 0900) Resp:  [16] 16 (09/12 0900) BP: (112-129)/(61-78) 129/78 (09/12 0900) SpO2:  [100 %] 100 % (09/12 0605)   General appearance: alert, cooperative and no distress GI: abnormal findings:  distended Extremities: 3+ BLE. warmth equalized on BLE.   Lab Results  Recent Labs  09/18/15 0401 09/19/15 1116 09/20/15 0607  WBC 2.5*  --   --   HGB 8.5*  --   --   HCT 27.1*  --   --   NA 139 140 141  K 5.2* 5.0 4.9  CL 113* 111 110  CO2 19* 19* 21*  BUN 73* 66* 70*  CREATININE 1.67* 1.57* 1.65*   Liver Panel No results for input(s): PROT, ALBUMIN, AST, ALT, ALKPHOS, BILITOT, BILIDIR, IBILI in the last 72 hours. Sedimentation Rate  Recent Labs  09/19/15 1805  ESRSEDRATE  5   C-Reactive Protein No results for input(s): CRP in the last 72 hours.  Microbiology: No results found for this or any previous visit (from the past 240 hour(s)).  Studies/Results: US Pelvis Limited  Result Date: 09/19/2015 CLINICAL DATA:  Complains of pain in the right inguinal canal for 1 day. History of hernia repair. Left testicle removed. EXAM: LIMITED ULTRASOUND OF PELVIS TECHNIQUE: Limited transabdominal ultrasound examination of the pelvis was performed. COMPARISON:  CT scan 05/18/2015 FINDINGS: Limited ultrasound of the right inguinal region performed. No herniated bowel loops visualized within the right inguinal canal. No focal fluid collections. Incidentally noted is a small hydrocele in the right scrotal sac. IMPRESSION: 1. No scintigraphic evidence for bowel containing right inguinal hernia 2. Incidental note made of small right hydrocele  Electronically Signed   By: Donavan Foil M.D.   On: 09/19/2015 19:29   Mr Tibia Fibula Left Wo Contrast  Result Date: 09/20/2015 CLINICAL DATA:  Left leg cellulitis in patient with lymphoproliferative disorder and pancytopenia. Question abscess or osteomyelitis. EXAM: MRI OF LOWER LEFT EXTREMITY WITHOUT CONTRAST TECHNIQUE: Multiplanar, multisequence MR imaging of the left lower leg was performed. No intravenous contrast was administered. COMPARISON:  MRI left lower leg 08/14/2015 in 09/13/2015. FINDINGS: Bones/Joint/Cartilage The axial and coronal sequences incidentally include the right lower leg. Bone marrow signal is normal bilaterally without evidence of osteomyelitis or worrisome marrow lesion. Ligaments Limited visualization demonstrates no abnormality. Muscles and Tendons Mild to moderate fatty atrophy of lower leg musculature bilaterally appears symmetric. Intermediate increased T2 signal in lower leg musculature and is now diffuse compared to the prior study with marked increase on the right seen. No focal fluid collection is identified. Soft tissues Extensive subcutaneous edema is seen about the lower legs bilaterally. Changes appear fairly symmetric and have worsened on the right since the prior study. IMPRESSION: Increased subcutaneous and intramuscular edema about both lower legs since the prior examination. Findings could be due to cellulitis and myositis. Dependent change causing subcutaneous edema and rhabdomyolysis causing intramuscular edema are also considerations. Negative for abscess or osteomyelitis. Electronically Signed   By: Inge Rise M.D.   On: 09/20/2015 08:08   Mr Ankle Left W Wo Contrast  Result Date: 09/20/2015 CLINICAL DATA:  Recurrent left lower extremity cellulitis and patient with lymphoproliferative disease and pancytopenia. EXAM: MRI OF THE LEFT ANKLE WITHOUT CONTRAST TECHNIQUE: Multiplanar, multisequence MR imaging of the ankle was performed. No intravenous  contrast was administered. COMPARISON:  MRI left ankle 09/13/2015. MRI left lower leg 09/13/2015 and this same day. FINDINGS: Subcutaneous edema without focal fluid collection is seen about the ankle and imaged foot. TENDONS Peroneal: Intact. Posteromedial: Intact. A small amount of fluid about the posteromedial tendons is not notably changed. Anterior: Intact. Achilles: Intact. Plantar Fascia: Unremarkable. LIGAMENTS Lateral: Intact. Medial: Intact. CARTILAGE Ankle Joint: Unremarkable. Subtalar Joints/Sinus Tarsi: Unremarkable. Bones: Normal marrow signal throughout. Other: None. IMPRESSION: Subcutaneous edema about the ankle and foot could be due to dependent change or cellulitis. Negative for abscess or osteomyelitis. Small amount of fluid about the posteromedial tendons is compatible with tenosynovitis and unchanged. Mild edema in the flexor hallucis longus muscle as seen on MRI left lower leg this same day. Electronically Signed   By: Inge Rise M.D.   On: 09/20/2015 08:17     Assessment/Plan: Cellulitis (+/- gout) Cytopenias (chronic, unchanged) R groin hydrocele  Total days of antibiotics: 10 vanco, 4 cefepime  MRI shows diffuse changes.  Will stop vanco today, cefepime in AM if  he does well Appreciate heme f/u.  Available as needed.          Bobby Rumpf Infectious Diseases (pager) [phone] [email or web address] 09/20/2015, 2:18 PM  LOS: 10 days

## 2015-09-21 DIAGNOSIS — E038 Other specified hypothyroidism: Secondary | ICD-10-CM

## 2015-09-21 DIAGNOSIS — F329 Major depressive disorder, single episode, unspecified: Secondary | ICD-10-CM

## 2015-09-21 DIAGNOSIS — I5032 Chronic diastolic (congestive) heart failure: Secondary | ICD-10-CM

## 2015-09-21 DIAGNOSIS — E872 Acidosis: Secondary | ICD-10-CM

## 2015-09-21 DIAGNOSIS — N183 Chronic kidney disease, stage 3 (moderate): Secondary | ICD-10-CM

## 2015-09-21 DIAGNOSIS — D600 Chronic acquired pure red cell aplasia: Secondary | ICD-10-CM

## 2015-09-21 DIAGNOSIS — I10 Essential (primary) hypertension: Secondary | ICD-10-CM

## 2015-09-21 DIAGNOSIS — E875 Hyperkalemia: Secondary | ICD-10-CM

## 2015-09-21 LAB — BASIC METABOLIC PANEL
ANION GAP: 9 (ref 5–15)
BUN: 69 mg/dL — ABNORMAL HIGH (ref 6–20)
CHLORIDE: 108 mmol/L (ref 101–111)
CO2: 22 mmol/L (ref 22–32)
Calcium: 8.6 mg/dL — ABNORMAL LOW (ref 8.9–10.3)
Creatinine, Ser: 1.69 mg/dL — ABNORMAL HIGH (ref 0.61–1.24)
GFR calc Af Amer: 39 mL/min — ABNORMAL LOW (ref >60)
GFR, EST NON AFRICAN AMERICAN: 34 mL/min — AB (ref >60)
GLUCOSE: 85 mg/dL (ref 65–99)
POTASSIUM: 4.4 mmol/L (ref 3.5–5.1)
Sodium: 139 mmol/L (ref 135–145)

## 2015-09-21 LAB — GLUCOSE, CAPILLARY
GLUCOSE-CAPILLARY: 90 mg/dL (ref 65–99)
GLUCOSE-CAPILLARY: 156 mg/dL — AB (ref 65–99)
Glucose-Capillary: 119 mg/dL — ABNORMAL HIGH (ref 65–99)
Glucose-Capillary: 94 mg/dL (ref 65–99)

## 2015-09-21 MED ORDER — FUROSEMIDE 10 MG/ML IJ SOLN
80.0000 mg | Freq: Once | INTRAMUSCULAR | Status: AC
  Administered 2015-09-21: 80 mg via INTRAVENOUS
  Filled 2015-09-21: qty 8

## 2015-09-21 NOTE — Progress Notes (Signed)
Progress Note    [name]  PQZ:[id number] DOB: [birth year]  DOA: 09/09/2015 PCP: Binnie Rail, MD   Subjective:   Patient seen with nursing staff at bedside, has bilateral lower extremity swelling and hemosiderin staining suggesting chronic stasis dermatitis.   Brief Narrative:   Chief complaint: Follow-up left leg pain and swelling.  [name] is an 82 y.o. male with a PMH of severe AS--> S/P Aortic valve replacement surgery at [facility] on 01/23/2011 (porcine valve); S/P transcatheter aortic valve replacement [facility] 05/14/2014 (bovine valve) , chronic diastolic CHF, PAF not on anticoagulation, bradycardia, QT prolongation, CKD (chronic kidney disease), stage III, Lymphoproliferative DO, throat cancer , severe pancytopenia followed by Dr. Benay Spice, recurrent cellulitis of left leg with history of sepsis/septic shock. His recent flare was treated with doxycycline, but due to failure of outpatient therapy he was referred for evaluation given his high risk of deterioration secondary to pancytopenia.  Assessment/Plan:    Left leg cellulitis/Recurrent cellulitis/Tenosynovitis of lower leg in the setting of a lymphoproliferative active disorder and pancytopenia Being followed by ID who recommends ongoing treatment with vancomycin and Cefepime which was added 09/15/15. Blood cultures negative. MRI done 09/13/15 negative for abscess or osteomyelitis, repeated 09/19/15 which was again negative for abscess or osteomyelitis. ESR and CK is not elevated. Lactic acid remains persistently elevated, but doubt related to infection (see below).  Lamisil cream ordered to bilateral feet for Candida as this is likely serving as the portal of entry. -We'll obtain arterial ultrasound with ABI. -There is worsening swelling, patient is on Lasix, will give extra dose, keep lower extremity elevated.  Hyperkalemia/metabolic acidosis/lactic acidosis Metabolic acidosis likely from lactic acidosis versus chronic  kidney disease.  Type 2 diabetes mellitus with complication (HCC) Diabetes currently uncontrolled, likely secondary to steroids. Currently being managed with Levemir 25 units daily and moderate scale SSI QAC/HS with 4 units of meal coverage. CBGs 93-169. Will decrease Levemir to 15 units given that we are stopping Solu-Medrol today to avoid hypoglycemia.  Gout Discontinue Solu-Medrol. Continue oxycodone for pain control.  Essential hypertension Continue propranolol.  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 Discussed with Dr. Benay Spice who confirms that the lactic acid elevation may be related to his bone marrow process, versus liver disease (Dr. Benay Spice suspects underlying cirrhosis). He has had multiple bone marrow biopsies that have not revealed a specific etiology of his bone marrow failure. Most recently, he refused an additional bone marrow biopsy.  Dr. Benay Spice saw the patient in consultation on 09/20/15. He will follow-up with him as an outpatient post discharge.  Hypothyroidism Continue Synthroid.  Paroxysmal atrial fibrillation (HCC) Continue Pacerone.  CKD (chronic kidney disease), stage III Continue to monitor creatinine. Baseline creatinine 1.6-1.9. Current creatinine consistent with usual baseline values. Continue to monitor closely for deterioration of renal function while on vancomycin.  Depression Continue Zoloft.  S/P TAVR (transcatheter aortic valve replacement)  GERD (gastroesophageal reflux disease) No complaints of heartburn.  Chronic diastolic congestive heart failure (HCC) No complaints of shortness of breath. Discussed the patient's care with Dr. Burt Knack, his primary cardiologist by telephone due to concerns that his Lasix dose was causing his problem with gout. Based on our conversation, will resume Lasix at 80 mg daily, as the patient  has a history of significant fluid retention and decompensated heart failure without treatment with a diuretic.  Right groin swelling Likely from edema, status post ultrasound  which was negative for inguinal hernia. I have personally reviewed these images.   Family Communication/Anticipated D/C date and plan/Code Status   DVT prophylaxis: SCDs given thrombocytopenia. Code Status: Limited Code.  Family Communication: Daughter updated at the bedside. Disposition Plan: Possibly home tomorrow after he completes his course of IV antibiotics.   Medical Consultants:    Infectious Disease  Oncology   Procedures:    None  Anti-Infectives:    Vancomycin 09/09/15--->  Cefepime 09/15/15--->    Objective:    Vitals:   09/20/15 0900 09/20/15 2051 09/21/15 0455 09/21/15 1228  BP: 129/78 114/76 94/69 111/79  Pulse: 91 (!) 101 95 93  Resp: 16 18 16 16   Temp: 97.8 F (36.6 C) 98.1 F (36.7 C) 97.5 F (36.4 C) 97.6 F (36.4 C)  TempSrc: Oral Oral Oral Oral  SpO2:  100% 100% 100%  Weight:      Height:        Intake/Output Summary (Last 24 hours) at 09/21/15 1425 Last data filed at 09/21/15 1300  Gross per 24 hour  Intake              720 ml  Output             2750 ml  Net            -2030 ml   Filed Weights   09/09/15 1042 09/10/15 0513 09/11/15 0800  Weight: 78.5 kg (173 lb) 78.4 kg (172 lb 13.5 oz) 78.4 kg (172 lb 14.4 oz)    Exam: General exam: Appears calm and comfortable.  Respiratory system: Clear to auscultation. Respiratory effort normal. Cardiovascular system: S1 & S2 heard, HSIR. No JVD,  rubs, gallops or clicks. No murmurs. Gastrointestinal system: Abdomen is mildly distended. Right inguinal hernia that reduces with pressure. Normal bowel sounds heard. Central nervous system: Alert and oriented. No focal neurological deficits. Extremities: BLE swelling appears similar to today. Skin: Extensive ecchymosis on all extremities.[name] Kitchen Psychiatry: Judgement and  insight appear normal. Mood & affect mildly depressed.   Data Reviewed:   I have personally reviewed following labs and imaging studies:  Labs: Basic Metabolic Panel:  Recent Labs Lab 09/17/15 0409 09/18/15 0401 09/19/15 1116 09/20/15 0607 09/21/15 0454  NA 139 139 140 141 139  K 5.3* 5.2* 5.0 4.9 4.4  CL 112* 113* 111 110 108  CO2 19* 19* 19* 21* 22  GLUCOSE 170* 188* 182* 95 85  BUN 68* 73* 66* 70* 69*  CREATININE 1.54* 1.67* 1.57* 1.65* 1.69*  CALCIUM 8.8* 8.8* 8.6* 8.4* 8.6*   GFR Estimated Creatinine Clearance: 29.4 mL/min (by C-G formula based on SCr of 1.69 mg/dL (H)). Liver Function Tests: No results for input(s): AST, ALT, ALKPHOS, BILITOT, PROT, ALBUMIN in the last 168 hours. CBC:  Recent Labs Lab 09/15/15 0419 09/16/15 0501 09/17/15 0409 09/18/15 0401  WBC 3.5* 2.5* 1.7* 2.5*  NEUTROABS 3.2  --   --   --   HGB 8.9* 8.4* 7.6* 8.5*  HCT 28.6* 26.5* 24.0* 27.1*  MCV 89.1 89.5 87.9 89.7  PLT 30* 28* 30* 37*   CBG:  Recent Labs Lab 09/20/15 1149 09/20/15 1604 09/20/15 2107 09/21/15 0629 09/21/15 1116  GLUCAP 94 110* 176* 90 119*   Sepsis Labs:  Recent Labs Lab 09/15/15 0419 09/15/15 0848 09/16/15 0501 09/16/15 0923 09/17/15 0409 09/17/15 1114 09/18/15 0401 09/18/15 0937  WBC 3.5*  --  2.5*  --  1.7*  --  2.5*  --   LATICACIDVEN  --  2.5*  --  2.0*  --  2.4*  --  3.2*    Microbiology No results found for this or any previous visit (from the past 240 hour(s)).  Radiology: US Pelvis Limited  Result Date: 09/19/2015 CLINICAL DATA:  Complains of pain in the right inguinal canal for 1 day. History of hernia repair. Left testicle removed. EXAM: LIMITED ULTRASOUND OF PELVIS TECHNIQUE: Limited transabdominal ultrasound examination of the pelvis was performed. COMPARISON:  CT scan 05/18/2015 FINDINGS: Limited ultrasound of the right inguinal region performed. No herniated bowel loops visualized within the right inguinal canal. No focal fluid  collections. Incidentally noted is a small hydrocele in the right scrotal sac. IMPRESSION: 1. No scintigraphic evidence for bowel containing right inguinal hernia 2. Incidental note made of small right hydrocele Electronically Signed   By: Donavan Foil M.D.   On: 09/19/2015 19:29   Mr Tibia Fibula Left Wo Contrast  Result Date: 09/20/2015 CLINICAL DATA:  Left leg cellulitis in patient with lymphoproliferative disorder and pancytopenia. Question abscess or osteomyelitis. EXAM: MRI OF LOWER LEFT EXTREMITY WITHOUT CONTRAST TECHNIQUE: Multiplanar, multisequence MR imaging of the left lower leg was performed. No intravenous contrast was administered. COMPARISON:  MRI left lower leg 08/14/2015 in 09/13/2015. FINDINGS: Bones/Joint/Cartilage The axial and coronal sequences incidentally include the right lower leg. Bone marrow signal is normal bilaterally without evidence of osteomyelitis or worrisome marrow lesion. Ligaments Limited visualization demonstrates no abnormality. Muscles and Tendons Mild to moderate fatty atrophy of lower leg musculature bilaterally appears symmetric. Intermediate increased T2 signal in lower leg musculature and is now diffuse compared to the prior study with marked increase on the right seen. No focal fluid collection is identified. Soft tissues Extensive subcutaneous edema is seen about the lower legs bilaterally. Changes appear fairly symmetric and have worsened on the right since the prior study. IMPRESSION: Increased subcutaneous and intramuscular edema about both lower legs since the prior examination. Findings could be due to cellulitis and myositis. Dependent change causing subcutaneous edema and rhabdomyolysis causing intramuscular edema are also considerations. Negative for abscess or osteomyelitis. Electronically Signed   By: Inge Rise M.D.   On: 09/20/2015 08:08   Mr Ankle Left W Wo Contrast  Result Date: 09/20/2015 CLINICAL DATA:  Recurrent left lower extremity  cellulitis and patient with lymphoproliferative disease and pancytopenia. EXAM: MRI OF THE LEFT ANKLE WITHOUT CONTRAST TECHNIQUE: Multiplanar, multisequence MR imaging of the ankle was performed. No intravenous contrast was administered. COMPARISON:  MRI left ankle 09/13/2015. MRI left lower leg 09/13/2015 and this same day. FINDINGS: Subcutaneous edema without focal fluid collection is seen about the ankle and imaged foot. TENDONS Peroneal: Intact. Posteromedial: Intact. A small amount of fluid about the posteromedial tendons is not notably changed. Anterior: Intact. Achilles: Intact. Plantar Fascia: Unremarkable. LIGAMENTS Lateral: Intact. Medial: Intact. CARTILAGE Ankle Joint: Unremarkable. Subtalar Joints/Sinus Tarsi: Unremarkable. Bones: Normal marrow signal throughout. Other: None. IMPRESSION: Subcutaneous edema about the ankle and foot could be due to dependent change or cellulitis. Negative for abscess or osteomyelitis. Small amount of fluid about the posteromedial tendons is compatible with tenosynovitis and unchanged. Mild edema in the flexor hallucis longus muscle as seen on MRI left lower leg this same day. Electronically Signed   By: Inge Rise M.D.   On: 09/20/2015 08:17    Medications:   . amiodarone  200 mg Oral Daily  . ceFEPime (MAXIPIME) IV  1 g Intravenous Q24H  . furosemide  80 mg Oral Daily  . insulin aspart  0-15 Units Subcutaneous TID WC  .  insulin aspart  0-5 Units Subcutaneous QHS  . insulin aspart  4 Units Subcutaneous TID WC  . insulin detemir  15 Units Subcutaneous Daily  . levothyroxine  25 mcg Oral QAC breakfast  . polyethylene glycol  17 g Oral Daily  . propranolol  5 mg Oral BID  . sertraline  50 mg Oral QHS  . sodium bicarbonate  1,300 mg Oral BID  . terbinafine   Topical BID  . thiamine  100 mg Oral Daily   Continuous Infusions: . sodium chloride 100 mL/hr at 09/18/15 0900    Medical decision making remains of high complexity and this patient is at  high risk of deterioration, therefore this is a level 3 visit.    LOS: 11 days   [facility] Pager [phone].  *Please refer to [email or web address], password [email or web address] to get updated schedule on who will round on this patient, as hospitalists switch teams weekly. If 7PM-7AM, please contact night-coverage at [email or web address], password [email or web address] for any overnight needs.  09/21/2015, 2:25 PM

## 2015-09-22 ENCOUNTER — Inpatient Hospital Stay (HOSPITAL_COMMUNITY): Payer: Commercial Managed Care - HMO

## 2015-09-22 ENCOUNTER — Encounter (HOSPITAL_COMMUNITY): Payer: Commercial Managed Care - HMO

## 2015-09-22 DIAGNOSIS — M79609 Pain in unspecified limb: Secondary | ICD-10-CM

## 2015-09-22 DIAGNOSIS — M109 Gout, unspecified: Secondary | ICD-10-CM

## 2015-09-22 LAB — CBC
HCT: 26.8 % — ABNORMAL LOW (ref 39.0–52.0)
Hemoglobin: 8.6 g/dL — ABNORMAL LOW (ref 13.0–17.0)
MCH: 28.4 pg (ref 26.0–34.0)
MCHC: 32.1 g/dL (ref 30.0–36.0)
MCV: 88.4 fL (ref 78.0–100.0)
Platelets: 36 10*3/uL — ABNORMAL LOW (ref 150–400)
RBC: 3.03 MIL/uL — ABNORMAL LOW (ref 4.22–5.81)
RDW: 17.4 % — AB (ref 11.5–15.5)
WBC: 3.3 10*3/uL — ABNORMAL LOW (ref 4.0–10.5)

## 2015-09-22 LAB — BASIC METABOLIC PANEL
Anion gap: 5 (ref 5–15)
BUN: 61 mg/dL — ABNORMAL HIGH (ref 6–20)
CALCIUM: 8.4 mg/dL — AB (ref 8.9–10.3)
CO2: 28 mmol/L (ref 22–32)
Chloride: 107 mmol/L (ref 101–111)
Creatinine, Ser: 1.69 mg/dL — ABNORMAL HIGH (ref 0.61–1.24)
GFR calc Af Amer: 39 mL/min — ABNORMAL LOW (ref >60)
GFR, EST NON AFRICAN AMERICAN: 34 mL/min — AB (ref >60)
GLUCOSE: 101 mg/dL — AB (ref 65–99)
Potassium: 4.4 mmol/L (ref 3.5–5.1)
Sodium: 140 mmol/L (ref 135–145)

## 2015-09-22 LAB — GLUCOSE, CAPILLARY
GLUCOSE-CAPILLARY: 88 mg/dL (ref 65–99)
GLUCOSE-CAPILLARY: 121 mg/dL — AB (ref 65–99)
Glucose-Capillary: 129 mg/dL — ABNORMAL HIGH (ref 65–99)
Glucose-Capillary: 100 mg/dL — ABNORMAL HIGH (ref 65–99)

## 2015-09-22 MED ORDER — FUROSEMIDE 10 MG/ML IJ SOLN
80.0000 mg | Freq: Two times a day (BID) | INTRAMUSCULAR | Status: DC
Start: 2015-09-22 — End: 2015-09-23
  Administered 2015-09-22 – 2015-09-23 (×2): 80 mg via INTRAVENOUS
  Filled 2015-09-22 (×2): qty 8

## 2015-09-22 MED ORDER — FUROSEMIDE 10 MG/ML IJ SOLN
80.0000 mg | Freq: Once | INTRAMUSCULAR | Status: AC
  Administered 2015-09-22: 80 mg via INTRAVENOUS
  Filled 2015-09-22: qty 8

## 2015-09-22 NOTE — Consult Note (Signed)
   [facility] CM Inpatient Consult   09/22/2015  RUSHIL SOLAZZO 02/01/1922 [id number]   [facility] Care Management follow up visit. Spoke with Mr. Ferroni at bedside. Discussed his discharge plans. States he will likely go home tomorrow without iv antibiotics. States he will have family check in on him. Discussed with inpatient RNCM Edwin Cap) about Mr. Boghosian having home health at discharge as he can really benefit from both home health RN and PT services. Will request for Mr. Duch to be reassigned to [facility] for post hospital follow up.  Marthenia Rolling, MSN-Ed, RN,BSN [facility] Liaison [phone]

## 2015-09-22 NOTE — Progress Notes (Signed)
Progress Note    [name]  GEX:[id number] DOB: 02-06-1922  DOA: 09/09/2015 PCP: Binnie Rail, MD   Subjective:   Denies any new complaints, still has lower extremity edema, his daughter was at bedside. Vascular ultrasound to be done today, continue diuresis with IV Lasix.  Brief Narrative:   Chief complaint: Follow-up left leg pain and swelling.  [name] is an 82 y.o. 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 Benay Spice, recurrent cellulitis of left leg with history of sepsis/septic shock. His recent flare was treated with doxycycline, but due to failure of outpatient therapy he was referred for evaluation given his high risk of deterioration secondary to pancytopenia.  Assessment/Plan:    Left leg cellulitis/Recurrent cellulitis/Tenosynovitis of lower leg in the setting of a lymphoproliferative active disorder and pancytopenia Being followed by ID who recommends ongoing treatment with vancomycin and Cefepime which was added 09/15/15. Blood cultures negative. MRI done 09/13/15 negative for abscess or osteomyelitis, repeated 09/19/15 which was again negative for abscess or osteomyelitis. ESR and CK is not elevated. Lactic acid remains persistently elevated, but doubt related to infection (see below).  Lamisil cream ordered to bilateral feet for Candida as this is likely serving as the portal of entry. -Continues to have some lower extremity edema, continue IV diuresis, pending ABI. -I do component to the left lower extremity edema/redness is chronic stasis especially its bilateral (see pictures)  Hyperkalemia/metabolic acidosis/lactic  acidosis Metabolic acidosis likely from lactic acidosis versus chronic kidney disease.  Type 2 diabetes mellitus with complication (HCC) Diabetes currently uncontrolled, likely secondary to steroids. Currently being managed with Levemir 25 units daily and moderate scale SSI QAC/HS with 4 units of meal coverage. CBGs 93-169. Will decrease Levemir to 15 units given that we are stopping Solu-Medrol today to avoid hypoglycemia.  Gout Discontinue Solu-Medrol. Continue oxycodone for pain control.  Essential hypertension Continue propranolol.  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 Discussed with Dr. Benay Spice who confirms that the lactic acid elevation may be related to his bone marrow process, versus liver disease (Dr. Benay Spice suspects underlying cirrhosis). He has had multiple bone marrow biopsies that have not revealed a specific etiology of his bone marrow failure. Most recently, he refused an additional bone marrow biopsy.  Dr. Benay Spice saw the patient in consultation on 09/20/15. He will follow-up with him as an outpatient post discharge.  Hypothyroidism Continue Synthroid.  Paroxysmal atrial fibrillation (HCC) Continue Pacerone.  CKD (chronic kidney disease), stage III Continue to monitor creatinine. Baseline creatinine 1.6-1.9. Current creatinine consistent with usual baseline values. Continue to monitor closely for deterioration of renal function while on vancomycin.  Depression Continue Zoloft.  S/P TAVR (transcatheter aortic valve replacement)  GERD (gastroesophageal reflux disease) No complaints of heartburn.  Chronic diastolic congestive heart failure (HCC) No complaints of shortness of breath. Discussed the patient's care with Dr. Burt Knack, his primary cardiologist by telephone due to concerns that his Lasix dose was causing his problem with gout. Based  on our conversation, will resume Lasix at 80 mg daily, as the patient has a history of significant fluid retention and decompensated heart failure without treatment with  a diuretic.  Right groin swelling Likely from edema, status post ultrasound which was negative for inguinal hernia. I have personally reviewed these images.   Family Communication/Anticipated D/C date and plan/Code Status   DVT prophylaxis: SCDs given thrombocytopenia. Code Status: Limited Code.  Family Communication: Daughter updated at the bedside. Disposition Plan: Possibly home tomorrow after he completes his course of IV antibiotics.   Medical Consultants:    Infectious Disease  Oncology   Procedures:    None  Anti-Infectives:    Vancomycin 09/09/15--->  Cefepime 09/15/15--->    Objective:    Vitals:   09/21/15 0455 09/21/15 1228 09/21/15 2125 09/22/15 0511  BP: 94/69 111/79 114/61 108/66  Pulse: 95 93 89 82  Resp: 16 16 17 16   Temp: 97.5 F (36.4 C) 97.6 F (36.4 C) 97.9 F (36.6 C) 97.8 F (36.6 C)  TempSrc: Oral Oral Oral Oral  SpO2: 100% 100% 100% 100%  Weight:      Height:        Intake/Output Summary (Last 24 hours) at 09/22/15 1224 Last data filed at 09/22/15 0900  Gross per 24 hour  Intake              720 ml  Output             3475 ml  Net            -2755 ml   Filed Weights   09/09/15 1042 09/10/15 0513 09/11/15 0800  Weight: 78.5 kg (173 lb) 78.4 kg (172 lb 13.5 oz) 78.4 kg (172 lb 14.4 oz)    Exam: General exam: Appears calm and comfortable.  Respiratory system: Clear to auscultation. Respiratory effort normal. Cardiovascular system: S1 & S2 heard, HSIR. No JVD,  rubs, gallops or clicks. No murmurs. Gastrointestinal system: Abdomen is mildly distended. Right inguinal hernia that reduces with pressure. Normal bowel sounds heard. Central nervous system: Alert and oriented. No focal neurological deficits. Extremities: BLE swelling appears similar to today. Skin:  Extensive ecchymosis on all extremities.[name] Kitchen Psychiatry: Judgement and insight appear normal. Mood & affect mildly depressed.       These 2 pictures from 09/22/2015. The picture below is from 09/11/2015        Data Reviewed:   I have personally reviewed following labs and imaging studies:  Labs: Basic Metabolic Panel:  Recent Labs Lab 09/18/15 0401 09/19/15 1116 09/20/15 0607 09/21/15 0454 09/22/15 0351  NA 139 140 141 139 140  K 5.2* 5.0 4.9 4.4 4.4  CL 113* 111 110 108 107  CO2 19* 19* 21* 22 28  GLUCOSE 188* 182* 95 85 101*  BUN 73* 66* 70* 69* 61*  CREATININE 1.67* 1.57* 1.65* 1.69* 1.69*  CALCIUM 8.8* 8.6* 8.4* 8.6* 8.4*   GFR Estimated Creatinine Clearance: 29.4 mL/min (by C-G formula based on SCr of 1.69 mg/dL (H)). Liver Function Tests: No results for input(s): AST, ALT, ALKPHOS, BILITOT, PROT, ALBUMIN in the last 168 hours. CBC:  Recent Labs Lab 09/16/15 0501 09/17/15 0409 09/18/15 0401 09/22/15 0351  WBC 2.5* 1.7* 2.5* 3.3*  HGB 8.4* 7.6* 8.5* 8.6*  HCT 26.5* 24.0* 27.1* 26.8*  MCV 89.5 87.9 89.7 88.4  PLT 28* 30* 37* 36*   CBG:  Recent Labs Lab 09/21/15 1116 09/21/15 1639 09/21/15 2128 09/22/15 0653 09/22/15 1125  GLUCAP 119* 94 156* 88 121*   Sepsis Labs:  Recent Labs Lab 09/16/15 0501 09/16/15 0923 09/17/15 0409 09/17/15 1114 09/18/15 0401 09/18/15 0937 09/22/15 0351  WBC 2.5*  --  1.7*  --  2.5*  --  3.3*  LATICACIDVEN  --  2.0*  --  2.4*  --  3.2*  --     Microbiology No results found for this or any previous visit (from the past 240 hour(s)).  Radiology: No results found.  Medications:   . amiodarone  200 mg Oral Daily  . furosemide  80 mg Oral Daily  . insulin aspart  0-15 Units Subcutaneous TID WC  . insulin aspart  0-5 Units Subcutaneous QHS  . insulin aspart  4 Units Subcutaneous TID WC  . insulin detemir  15 Units Subcutaneous Daily  . levothyroxine  25 mcg Oral QAC breakfast  . polyethylene glycol  17  g Oral Daily  . propranolol  5 mg Oral BID  . sertraline  50 mg Oral QHS  . sodium bicarbonate  1,300 mg Oral BID  . terbinafine   Topical BID  . thiamine  100 mg Oral Daily   Continuous Infusions: . sodium chloride 100 mL/hr at 09/18/15 0900    Medical decision making remains of high complexity and this patient is at high risk of deterioration, therefore this is a level 3 visit.    LOS: 12 days   [facility] Pager [phone].  *Please refer to [email or web address], password [email or web address] to get updated schedule on who will round on this patient, as hospitalists switch teams weekly. If 7PM-7AM, please contact night-coverage at [email or web address], password [email or web address] for any overnight needs.  09/22/2015, 12:24 PM

## 2015-09-22 NOTE — Care Management Important Message (Signed)
Important Message  Patient Details  Name: [name] MRN: [id number] Date of Birth: 05/11/1922   Medicare Important Message Given:  Yes    Kaeleb Emond Abena 09/22/2015, 11:11 AM

## 2015-09-22 NOTE — Progress Notes (Signed)
VASCULAR LAB PRELIMINARY  ARTERIAL  ABI completed:    RIGHT    LEFT    PRESSURE WAVEFORM  PRESSURE WAVEFORM  BRACHIAL 130 Triphasic BRACHIAL IV site Triphasic  DP 155 Triphasic DP 177 Triphasic  PT 182 Triphasic PT 228 Triphasic    RIGHT LEFT  ABI 1.40 1.75   ABIs elevated pressures may indicate calcification of the vessels with normal Doppler waveforms throughout the pedal vessels bilaterally.  [name], [place], RVS 09/22/2015, 5:38 PM

## 2015-09-23 DIAGNOSIS — K219 Gastro-esophageal reflux disease without esophagitis: Secondary | ICD-10-CM

## 2015-09-23 LAB — BASIC METABOLIC PANEL
Anion gap: 7 (ref 5–15)
BUN: 59 mg/dL — ABNORMAL HIGH (ref 6–20)
CALCIUM: 8.6 mg/dL — AB (ref 8.9–10.3)
CO2: 31 mmol/L (ref 22–32)
CREATININE: 1.84 mg/dL — AB (ref 0.61–1.24)
Chloride: 103 mmol/L (ref 101–111)
GFR calc non Af Amer: 30 mL/min — ABNORMAL LOW (ref >60)
GFR, EST AFRICAN AMERICAN: 35 mL/min — AB (ref >60)
Glucose, Bld: 99 mg/dL (ref 65–99)
Potassium: 4.6 mmol/L (ref 3.5–5.1)
Sodium: 141 mmol/L (ref 135–145)

## 2015-09-23 LAB — GLUCOSE, CAPILLARY
GLUCOSE-CAPILLARY: 89 mg/dL (ref 65–99)
Glucose-Capillary: 109 mg/dL — ABNORMAL HIGH (ref 65–99)

## 2015-09-23 NOTE — Discharge Summary (Signed)
Physician Discharge Summary  [name]:[id number] DOB: Jan 24, 1922 DOA: 09/09/2015  PCP: Walter Rail, MD  Admit date: 09/09/2015 Discharge date: 09/23/2015  Admitted From: Home Disposition: Home  Recommendations for Outpatient Follow-up:  1. Follow up with PCP in 1-2 weeks 2. Please obtain BMP/CBC in one week  Home Health: PT and RN Equipment/Devices: NA  Discharge Condition: Stable CODE STATUS: Partial code Diet recommendation: Heart Healthy  Brief/Interim Summary: [name] is a 82 y.o. male with medical history significant of AS s/p TAVR 2016, dCHF, lymphoproliferative disorder with severe pancytopenia followed by [name] and PAF not on anticoagulation. He has had recurrent left lower leg cellulitis in the past; most recently admitted for septic shock due to cellulitis. He also has history of pancytopenia and on chart review, it appears that his recurrent cellulitis may be attributed to his low cell counts. At time of discharge early August 2017, he was discharged on keflex. Since discharge, he had another episode of LLE cellulitis and was placed on doxycycline outpatient. His last dose is in 2 days. This episode of LLE swelling started yesterday, without any trauma. He was seen at [facility] last night and work up was negative including negative [place] for DVT. He was seen at [facility] office this morning for follow up. Due to his chronic pancytopenia and previous history of septic shock, they recommended hospital evaluation for his recurrent left lower leg cellulitis and IV abx therapy. He admits to LLE swelling and pain in his skin but not within the joint. His pain is worse with movement and pressure. He also admits to nausea without vomiting. He denies any fevers, chills, chest pain, shortness of breath, cough, diarrhea, abdominal pain. He also denies any spontaneous bleeding such as epistaxis or gum bleeding. No hematochezia.    Discharge Diagnoses:  Principal Problem:    Left leg cellulitis Active Problems:   Type 2 diabetes mellitus with complication (HCC)   Gout   Essential hypertension   Other pancytopenia (HCC)   Hypothyroidism   Paroxysmal atrial fibrillation (HCC)   CKD (chronic kidney disease), stage III   Depression   S/P TAVR (transcatheter aortic valve replacement)   GERD (gastroesophageal reflux disease)   Chronic diastolic congestive heart failure (HCC)   Lymphoproliferative disorder (HCC)   Recurrent cellulitis of lower leg   Anemia due to bone marrow failure (HCC)   Metabolic acidosis   Hyperkalemia   Lactic acidosis   Hernia of abdominal cavity   Tenosynovitis of ankle   Left leg cellulitis/Recurrent cellulitis/Tenosynovitis of lower leg in the setting of a lymphoproliferative active disorder and pancytopenia Being followed by ID who recommends ongoing treatment with vancomycin and Cefepime which was added 09/15/15. Blood cultures negative. MRI done 09/13/15 negative for abscess or osteomyelitis, repeated 09/19/15 which was again negative for abscess or osteomyelitis. ESR and CK is not elevated. Lactic acid remains persistently elevated, but doubt related to infection (see below).  Lamisil cream ordered to bilateral feet for Candida as this is likely serving as the portal of entry. -ABIs showed calcific arteries but triphasic waveforms, right 1.4 and left 1.75. -I think patient has a component of chronic status dermatitis based on hemosiderin discoloration of his skin. -Patient has this problem since July 2017 had multiple courses of antibiotics, continue diuretics and gout medications. -On discharge patient did not have a significant swelling or pain.  Hyperkalemia/metabolic acidosis/lactic acidosis Metabolic acidosis likely from lactic acidosis versus chronic kidney disease. This is resolved.  Type 2  diabetes mellitus with complication ([place]) -Diabetes currently uncontrolled, likely secondary to steroids.  -Currently being managed with  Levemir 25 units daily and moderate scale SSI QAC/HS with 4 units of meal coverage.   Gout Was briefly on Solu-Medrol, continue colchicine and allopurinol.  Essential hypertension Continue propranolol.  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 Discussed with [name] who confirms that the lactic acid elevation may be related to his bone marrow process, versus liver disease ([name] suspects underlying cirrhosis). He has had multiple bone marrow biopsies that have not revealed a specific etiology of his bone marrow failure. Most recently, he refused an additional bone marrow biopsy.  [name] saw the patient in consultation on 09/20/15. He will follow-up with him as an outpatient post discharge.  Hypothyroidism Continue Synthroid.  Paroxysmal atrial fibrillation (HCC) Continue Pacerone.  CKD (chronic kidney disease), stage III Continue to monitor creatinine. Baseline creatinine 1.6-1.9. Current creatinine consistent with usual baseline values.  Continue to monitor closely for deterioration of renal function while on vancomycin. On discharge back to home dose of Lasix, creatinine is 1.84 on discharg.  Depression Continue Zoloft.  S/P TAVR (transcatheter aortic valve replacement)  GERD (gastroesophageal reflux disease) No complaints of heartburn.  Chronic diastolic congestive heart failure (HCC) No complaints of shortness of breath.  No evidence of decompensation, his home dose of Lasix restarted the time of discharge.  Right groin swelling Likely from edema, status post ultrasound which was negative for inguinal hernia. I have personally reviewed these images.   Discharge Instructions  Discharge Instructions    Diet - low sodium heart healthy    Complete by:  As directed    Increase activity slowly    Complete by:  As directed         Medication List    STOP taking these medications   cephALEXin 500 MG capsule Commonly known as:  KEFLEX   doxycycline 100 MG capsule Commonly known as:  MONODOX     TAKE these medications   allopurinol 100 MG tablet Commonly known as:  ZYLOPRIM Take 100 mg by mouth daily.   amiodarone 200 MG tablet Commonly known as:  PACERONE Take 1 tablet by mouth daily   clonazePAM 0.5 MG tablet Commonly known as:  KLONOPIN Take 0.25 mg by mouth daily as needed (Take 1/2 tablet once daily as needed if you wake up early).   colchicine 0.6 MG tablet Take 0.6 mg by mouth daily as needed (gout flares).   ferrous sulfate 325 (65 FE) MG tablet Take 325 mg by mouth 2 (two) times daily with a meal.   fish oil-omega-3 fatty acids 1000 MG capsule Take 1 g by mouth daily.   Flax Seed Oil 1000 MG Caps Take 1 capsule by mouth daily.   furosemide 80 MG tablet Commonly known as:  LASIX Take '80mg'$  in the morning and '40mg'$  in the afternoon   levothyroxine 25 MCG tablet Commonly known as:  SYNTHROID, LEVOTHROID Take 25 mcg by mouth daily before breakfast.   metolazone 2.5 MG tablet Commonly known as:  ZAROXOLYN Take 1 tablet (2.5 mg total) by mouth daily as needed (edema).   multivitamin tablet Take 1 tablet by mouth daily.   omeprazole 20 MG capsule Commonly known as:  PRILOSEC TAKE 1 CAPSULE EVERY DAY  (REPLACES  PANTOPRAZOLE)   potassium chloride 10 MEQ tablet Commonly known as:  K-DUR Take 1 tablet (10 mEq total) by  mouth daily.   propranolol 10 MG tablet Commonly known as:  INDERAL Take 1 tablet (10 mg total) by mouth 2 (two) times daily.   sertraline 50 MG tablet Commonly known as:  ZOLOFT TAKE 1 TABLET (50 MG TOTAL) BY MOUTH AT BEDTIME.   traMADol 50 MG tablet Commonly known as:  ULTRAM Take 50 mg by mouth every 8 (eight) hours as needed (pain). Reported on 06/28/2015   vitamin C 500 MG tablet Commonly known as:  ASCORBIC ACID Take 1 tablet (500 mg total) by mouth  daily.   Vitamin D 1000 units capsule Take 1 capsule (1,000 Units total) by mouth daily.      Follow-up Information    [facility] .   Specialty:  [place] Why:  Someone from [facility] will contact you to setup start time for Home Health Nurse. Contact information: [place] [address] [phone]          Allergies  Allergen Reactions  . Penicillins Other (See Comments)    08/15/2015:  Tolerated Cefepime Does not remember rxn (~1950) Has patient had a PCN reaction causing immediate rash, facial/tongue/throat swelling, SOB or lightheadedness with hypotension:YES Has patient had a PCN reaction causing severe rash involving mucus membranes or skin necrosis: NO Has patient had a PCN reaction that required hospitalization NO Has patient had a PCN reaction occurring within the last 10 years: NO If all of the above answers are "NO", then may proceed with Cephalosporin use.  [name] Kitchen Neomycin-Bacitracin Zn-Polymyx Rash and Other (See Comments)    ? Reaction (thinks he remembers redness)    Consultations:  Infectious disease.  Hematology/oncology    Procedures/Studies: US Pelvis Limited  Result Date: 09/19/2015 CLINICAL DATA:  Complains of pain in the right inguinal canal for 1 day. History of hernia repair. Left testicle removed. EXAM: LIMITED ULTRASOUND OF PELVIS TECHNIQUE: Limited transabdominal ultrasound examination of the pelvis was performed. COMPARISON:  CT scan 05/18/2015 FINDINGS: Limited ultrasound of the right inguinal region performed. No herniated bowel loops visualized within the right inguinal canal. No focal fluid collections. Incidentally noted is a small hydrocele in the right scrotal sac. IMPRESSION: 1. No scintigraphic evidence for bowel containing right inguinal hernia 2. Incidental note made of small right hydrocele Electronically Signed   By: Donavan Foil M.D.   On: 09/19/2015 19:29   Mr Tibia Fibula Left Wo  Contrast  Result Date: 09/20/2015 CLINICAL DATA:  Left leg cellulitis in patient with lymphoproliferative disorder and pancytopenia. Question abscess or osteomyelitis. EXAM: MRI OF LOWER LEFT EXTREMITY WITHOUT CONTRAST TECHNIQUE: Multiplanar, multisequence MR imaging of the left lower leg was performed. No intravenous contrast was administered. COMPARISON:  MRI left lower leg 08/14/2015 in 09/13/2015. FINDINGS: Bones/Joint/Cartilage The axial and coronal sequences incidentally include the right lower leg. Bone marrow signal is normal bilaterally without evidence of osteomyelitis or worrisome marrow lesion. Ligaments Limited visualization demonstrates no abnormality. Muscles and Tendons Mild to moderate fatty atrophy of lower leg musculature bilaterally appears symmetric. Intermediate increased T2 signal in lower leg musculature and is now diffuse compared to the prior study with marked increase on the right seen. No focal fluid collection is identified. Soft tissues Extensive subcutaneous edema is seen about the lower legs bilaterally. Changes appear fairly symmetric and have worsened on the right since the prior study. IMPRESSION: Increased subcutaneous and intramuscular edema about both lower legs since the prior examination. Findings could be due to cellulitis and myositis. Dependent change causing subcutaneous  edema and rhabdomyolysis causing intramuscular edema are also considerations. Negative for abscess or osteomyelitis. Electronically Signed   By: Inge Rise M.D.   On: 09/20/2015 08:08   Mr Tibia Fibula Left Wo Contrast  Result Date: 09/13/2015 CLINICAL DATA:  Recurrent left lower leg and ankle pain and swelling. Question cellulitis. Diabetic patient. No known injury. EXAM: MRI OF LOWER LEFT EXTREMITY WITHOUT CONTRAST; MRI OF THE LEFT ANKLE WITHOUT CONTRAST TECHNIQUE: Multiplanar, multisequence MR imaging of the left lower leg and ankle was performed. No intravenous contrast was administered.  COMPARISON:  MRI left lower leg 08/14/2015. FINDINGS: Bones/Joint/Cartilage The axial and coronal sequences incidentally include the right lower leg. There is no bone marrow signal abnormality to suggest osteomyelitis. No fracture or stress change is seen. No worrisome marrow lesion is identified. Ligaments Intact. Muscles and Tendons A small amount of fluid is seen about the posteromedial tendons of the ankles bilaterally suggestive of tenosynovitis. No tendon tear is identified. Musculature of the lower leg demonstrates some fatty atrophy which appears symmetric from right to left. No intramuscular fluid collection is identified. Mild edema is seen in the extensor digitorum longus, flexor digitorum longus and tibialis anterior muscles bilaterally, more notable on the left. This could be due to myositis or related to atrophy. Soft tissues Cutaneous and subcutaneous edema is seen bilaterally and worse on the left. Edematous change is progressive inferiorly. The appearance is mildly improved compared to the prior MRI. No focal fluid collection is identified. IMPRESSION: Bilateral lower leg cutaneous and subcutaneous edema is worse on the left and appears slightly improved compared to the prior study. Asymmetry is most in keeping with cellulitis. No abscess or osteomyelitis. Mild edema in the extensor digitorum longus, flexor digitorum longus and tibialis anterior muscles bilaterally appears slightly worse on the left and could be related to mild atrophy or myositis. Small amount of fluid about the posteromedial tendons of the ankles bilaterally is compatible with tenosynovitis without tendon tear. Electronically Signed   By: Inge Rise M.D.   On: 09/13/2015 08:36   Mr Ankle Left  Wo Contrast  Result Date: 09/20/2015 CLINICAL DATA:  Recurrent left lower extremity cellulitis and patient with lymphoproliferative disease and pancytopenia. EXAM: MRI OF THE LEFT ANKLE WITHOUT CONTRAST TECHNIQUE: Multiplanar,  multisequence MR imaging of the ankle was performed. No intravenous contrast was administered. COMPARISON:  MRI left ankle 09/13/2015. MRI left lower leg 09/13/2015 and this same day. FINDINGS: Subcutaneous edema without focal fluid collection is seen about the ankle and imaged foot. TENDONS Peroneal: Intact. Posteromedial: Intact. A small amount of fluid about the posteromedial tendons is not notably changed. Anterior: Intact. Achilles: Intact. Plantar Fascia: Unremarkable. LIGAMENTS Lateral: Intact. Medial: Intact. CARTILAGE Ankle Joint: Unremarkable. Subtalar Joints/Sinus Tarsi: Unremarkable. Bones: Normal marrow signal throughout. Other: None. IMPRESSION: Subcutaneous edema about the ankle and foot could be due to dependent change or cellulitis. Negative for abscess or osteomyelitis. Small amount of fluid about the posteromedial tendons is compatible with tenosynovitis and unchanged. Mild edema in the flexor hallucis longus muscle as seen on MRI left lower leg this same day. Electronically Signed   By: Inge Rise M.D.   On: 09/20/2015 08:17   Mr Ankle Left  Wo Contrast  Result Date: 09/13/2015 CLINICAL DATA:  Recurrent left lower leg and ankle pain and swelling. Question cellulitis. Diabetic patient. No known injury. EXAM: MRI OF LOWER LEFT EXTREMITY WITHOUT CONTRAST; MRI OF THE LEFT ANKLE WITHOUT CONTRAST TECHNIQUE: Multiplanar, multisequence MR imaging of the left lower leg and  ankle was performed. No intravenous contrast was administered. COMPARISON:  MRI left lower leg 08/14/2015. FINDINGS: Bones/Joint/Cartilage The axial and coronal sequences incidentally include the right lower leg. There is no bone marrow signal abnormality to suggest osteomyelitis. No fracture or stress change is seen. No worrisome marrow lesion is identified. Ligaments Intact. Muscles and Tendons A small amount of fluid is seen about the posteromedial tendons of the ankles bilaterally suggestive of tenosynovitis. No tendon tear  is identified. Musculature of the lower leg demonstrates some fatty atrophy which appears symmetric from right to left. No intramuscular fluid collection is identified. Mild edema is seen in the extensor digitorum longus, flexor digitorum longus and tibialis anterior muscles bilaterally, more notable on the left. This could be due to myositis or related to atrophy. Soft tissues Cutaneous and subcutaneous edema is seen bilaterally and worse on the left. Edematous change is progressive inferiorly. The appearance is mildly improved compared to the prior MRI. No focal fluid collection is identified. IMPRESSION: Bilateral lower leg cutaneous and subcutaneous edema is worse on the left and appears slightly improved compared to the prior study. Asymmetry is most in keeping with cellulitis. No abscess or osteomyelitis. Mild edema in the extensor digitorum longus, flexor digitorum longus and tibialis anterior muscles bilaterally appears slightly worse on the left and could be related to mild atrophy or myositis. Small amount of fluid about the posteromedial tendons of the ankles bilaterally is compatible with tenosynovitis without tendon tear. Electronically Signed   By: Inge Rise M.D.   On: 09/13/2015 08:36    (Echo, Carotid, EGD, Colonoscopy, ERCP)    Subjective:   Discharge Exam: Vitals:   09/23/15 0456 09/23/15 0831  BP: 93/65 99/72  Pulse: 82 97  Resp: 16   Temp: 97.7 F (36.5 C)    Vitals:   09/22/15 2011 09/23/15 0456 09/23/15 0500 09/23/15 0831  BP: 105/73 93/65  99/72  Pulse: 86 82  97  Resp: 16 16    Temp: 97.9 F (36.6 C) 97.7 F (36.5 C)    TempSrc: Oral Oral    SpO2: 100% 100%  100%  Weight:   78.8 kg (173 lb 12.8 oz)   Height:        General: Pt is alert, awake, not in acute distress Cardiovascular: RRR, S1/S2 +, no rubs, no gallops Respiratory: CTA bilaterally, no wheezing, no rhonchi Abdominal: Soft, NT, ND, bowel sounds + Extremities: no edema, no  cyanosis    The results of significant diagnostics from this hospitalization (including imaging, microbiology, ancillary and laboratory) are listed below for reference.     Microbiology: No results found for this or any previous visit (from the past 240 hour(s)).   Labs: BNP (last 3 results)  Recent Labs  10/05/14 0421 05/17/15 2329  BNP 887.4* 378.5*   Basic Metabolic Panel:  Recent Labs Lab 09/19/15 1116 09/20/15 0607 09/21/15 0454 09/22/15 0351 09/23/15 0554  NA 140 141 139 140 141  K 5.0 4.9 4.4 4.4 4.6  CL 111 110 108 107 103  CO2 19* 21* _0 GLUCOSE 182* 95 85 101* 99  BUN 66* 70* 69* 61* 59*  CREATININE 1.57* 1.65* 1.69* 1.69* 1.84*  CALCIUM 8.6* 8.4* 8.6* 8.4* 8.6*   Liver Function Tests: No results for input(s): AST, ALT, ALKPHOS, BILITOT, PROT, ALBUMIN in the last 168 hours. No results for input(s): LIPASE, AMYLASE in the last 168 hours. No results for input(s): AMMONIA in the last 168 hours. CBC:  Recent Labs Lab  09/17/15 0409 09/18/15 0401 09/22/15 0351  WBC 1.7* 2.5* 3.3*  HGB 7.6* 8.5* 8.6*  HCT 24.0* 27.1* 26.8*  MCV 87.9 89.7 88.4  PLT 30* 37* 36*   Cardiac Enzymes:  Recent Labs Lab 09/19/15 1805  CKTOTAL 31*   BNP: Invalid input(s): POCBNP CBG:  Recent Labs Lab 09/22/15 0653 09/22/15 1125 09/22/15 1559 09/22/15 2132 09/23/15 0612  GLUCAP 88 121* 129* 100* 109*   D-Dimer No results for input(s): DDIMER in the last 72 hours. Hgb A1c No results for input(s): HGBA1C in the last 72 hours. Lipid Profile No results for input(s): CHOL, HDL, LDLCALC, TRIG, CHOLHDL, LDLDIRECT in the last 72 hours. Thyroid function studies No results for input(s): TSH, T4TOTAL, T3FREE, THYROIDAB in the last 72 hours.  Invalid input(s): FREET3 Anemia work up No results for input(s): VITAMINB12, FOLATE, FERRITIN, TIBC, IRON, RETICCTPCT in the last 72 hours. Urinalysis    Component Value Date/Time   COLORURINE YELLOW 08/10/2015 2036    APPEARANCEUR CLEAR 08/10/2015 2036   LABSPEC 1.018 08/10/2015 2036   PHURINE 5.0 08/10/2015 2036   GLUCOSEU NEGATIVE 08/10/2015 2036   HGBUR NEGATIVE 08/10/2015 2036   BILIRUBINUR NEGATIVE 08/10/2015 2036   KETONESUR NEGATIVE 08/10/2015 2036   PROTEINUR NEGATIVE 08/10/2015 2036   UROBILINOGEN 0.2 05/13/2014 2314   NITRITE NEGATIVE 08/10/2015 2036   LEUKOCYTESUR NEGATIVE 08/10/2015 2036   Sepsis Labs Invalid input(s): PROCALCITONIN,  WBC,  LACTICIDVEN Microbiology No results found for this or any previous visit (from the past 240 hour(s)).   Time coordinating discharge: Over 30 minutes  SIGNED:   Birdie Hopes, MD  [facility] 09/23/2015, 10:09 AM Pager   If 7PM-7AM, please contact night-coverage [email or web address] Password [email or web address]

## 2015-09-23 NOTE — Care Management Note (Signed)
Case Management Note  Patient Details  Name: [name] MRN: [id number] Date of Birth: 1922-01-16  Subjective/Objective:  Left leg cellulitis, gout, DM, HTN                   Action/Plan: Discharge Planning: AVS reviewed:   NCM spoke to pt and dtr, Samuella Cota # [phone]. Dtr states she will be with the pt all weekend. Pt states he has cane, RW and bedside commode at home. Contacted [facility] Liaison, Mirna Mires to make aware of scheduled dc home today with [facility] RN and PT. New referral per rep and pt listed as HRI ([facility]). [facility] will follow up with pt for long-term RN Case Management. Faxed dc summary to PCP office per pt's/dtr request. They will call to make an appt in 1-2 weeks.   Elder Cyphers MD Expected Discharge Date:   09/23/2015             Expected Discharge Plan:  [place]  In-House Referral:  NA  Discharge planning Services  CM Consult  Post Acute Care Choice:  Home Health, Durable Medical Equipment Choice offered to:  Patient  DME Arranged:  N/A DME Agency:  NA  HH Arranged:  RN, PT HH Agency:  [place]  Status of Service:  Completed, signed off  If discussed at [place] of Stay Meetings, dates discussed:    Additional Comments:  Erenest Rasher, RN 09/23/2015, 3:08 PM

## 2015-09-26 ENCOUNTER — Telehealth: Payer: Self-pay | Admitting: Internal Medicine

## 2015-09-26 ENCOUNTER — Telehealth: Payer: Self-pay | Admitting: *Deleted

## 2015-09-26 ENCOUNTER — Other Ambulatory Visit: Payer: Self-pay

## 2015-09-26 NOTE — Patient Outreach (Signed)
Care Coordination: Patient called me to report that he is seeing his primary MD tomorrow.    PLAN: will continue weekly transition of care calls.   Tomasa Rand, RN, BSN, CEN [facility] [employer] [phone]

## 2015-09-26 NOTE — Telephone Encounter (Signed)
Has an appt with me tomorrow

## 2015-09-26 NOTE — Telephone Encounter (Signed)
Unable to get through to [facility] with [place], called daughter. appt scheduled for 9/19 at 945.

## 2015-09-26 NOTE — Telephone Encounter (Signed)
Please advise when you would like me to work pt in.

## 2015-09-26 NOTE — Telephone Encounter (Signed)
Left msg on triage stating wanting to let MD know pt came home Friday & they went out on Sat weight was 171, Sun 173.8, and today weight was 175. Pt did inform he had some fried chicken on yesterday...[name]

## 2015-09-26 NOTE — Patient Outreach (Signed)
Transition of care call: Patient was a readmission for cellulitis of the lower legs.  Reports that he is feeling better. States that he had leg pain over the weekend and is out of toradol.  Reports that he does not need it today as he is not having pain today.   Patient denies weakness and reports that he is eating well. States todays CBG is 165 and that is not on any medications for his DM. Reports he is off antibiotics as well.   Family is currently staying with patient on a rotation basis.  Patient reports that his children will taking him to his doctors appointments. Patient reports his concern today is weight gain. Reports that he has gained 4 pounds since he has been home. Reports afternoon dose of lasix was discontinued and he does not know what to do. Reports he feels like his legs/thighs are swollen.  Denies shortness of breath. Reports home health nurse came to see him yesterday and physical therapist is coming today.   Reviewed pending follow up appointments:  Dr. Quay Burow 9/26,  Reviewed with patient the need for labs this week per discharge instructions.                                                                         Dr. Burt Knack  12/8                                                                        Dr. Benay Spice   None. Patient reports that he will call and make follow up appointment.   PLAN:  (1) Placed call primary MD office about weight gain. This note sent to MD. (2) Offered home visit for 10/03/2015 and patient has accepted. (3) Provided my contact information to patient and daughter to call as needed. Reviewed 24 hour nurse line.   Outpatient Encounter Prescriptions as of 09/26/2015  Medication Sig Note  . allopurinol (ZYLOPRIM) 100 MG tablet Take 100 mg by mouth daily.   [name] Kitchen amiodarone (PACERONE) 200 MG tablet Take 1 tablet by mouth daily   . Cholecalciferol (VITAMIN D) 1000 UNITS capsule Take 1 capsule (1,000 Units total) by mouth daily.   . clonazePAM (KLONOPIN) 0.5  MG tablet Take 0.25 mg by mouth daily as needed (Take 1/2 tablet once daily as needed if you wake up early).   . colchicine 0.6 MG tablet Take 0.6 mg by mouth daily as needed (gout flares).    . ferrous sulfate 325 (65 FE) MG tablet Take 325 mg by mouth 2 (two) times daily with a meal.   . fish oil-omega-3 fatty acids 1000 MG capsule Take 1 g by mouth daily.     . Flaxseed, Linseed, (FLAX SEED OIL) 1000 MG CAPS Take 1 capsule by mouth daily.    . furosemide (LASIX) 80 MG tablet Take 80mg  in the morning and 40mg  in the afternoon 09/26/2015: Reports afternoon dose of lasix discontinued  . levothyroxine (SYNTHROID, LEVOTHROID) 25 MCG  tablet Take 25 mcg by mouth daily before breakfast.   . metolazone (ZAROXOLYN) 2.5 MG tablet Take 1 tablet (2.5 mg total) by mouth daily as needed (edema). 09/26/2015: Patient reports that he has to call Dr. Burt Knack prior to taking this medication.  . Multiple Vitamin (MULTIVITAMIN) tablet Take 1 tablet by mouth daily.     [name] Kitchen omeprazole (PRILOSEC) 20 MG capsule TAKE 1 CAPSULE EVERY DAY  (REPLACES  PANTOPRAZOLE)   . potassium chloride (K-DUR) 10 MEQ tablet Take 1 tablet (10 mEq total) by mouth daily.   . propranolol (INDERAL) 10 MG tablet Take 1 tablet (10 mg total) by mouth 2 (two) times daily.   . sertraline (ZOLOFT) 50 MG tablet TAKE 1 TABLET (50 MG TOTAL) BY MOUTH AT BEDTIME.   . vitamin C (ASCORBIC ACID) 500 MG tablet Take 1 tablet (500 mg total) by mouth daily.   . traMADol (ULTRAM) 50 MG tablet Take 50 mg by mouth every 8 (eight) hours as needed (pain). Reported on 06/28/2015 09/26/2015: Reports no prescription for this medication.    No facility-administered encounter medications on file as of 09/26/2015.    [facility] CM Care Plan Problem One   Flowsheet Row Most Recent Value  Care Plan Problem One  Recent hospital admission related to cellulitis of leg  Role Documenting the Problem One  Care Management [place] for Problem One  Active  [facility]N Long Term Goal  (31-90 days)  Patient will report no readmissions in the next 31 days.  THN Long Term Goal Start Date  09/26/15  Interventions for Problem One Long Term Goal  Reviewed importance of calling Md for any new or worsening concern for leg. Home visit planned for 10/03/2015  [facility] CM Short Term Goal #1 (0-30 days)  Patient will record CBG daily for the next 30 days.  [facility] CM Short Term Goal #1 Start Date  09/26/15  Interventions for Short Term Goal #1  reviewed importance of daily monitoring of CBG. Home visit planned.  [facility] CM Short Term Goal #2 (0-30 days)  Patient will call MD for worsening leg infection in the next 30 days.  [facility] CM Short Term Goal #2 Start Date  09/26/15  Interventions for Short Term Goal #2  reviewed reasons to call Md, reddness, increase warmth, swelling or increase pain or drainage.  [facility]N CM Short Term Goal #3 (0-30 days)  Patient will report weighing daily for the next 30 days.  THN CM Short Term Goal #3 Start Date  09/26/15  Interventions for Short Tern Goal #3  Reviewed with patient parameters to call MD for weight gain.  Reviewed importance of following a low salt diet.  Called MD office and reported weight gain.      Tomasa Rand, RN, BSN, CEN [facility] [employer] [phone]

## 2015-09-26 NOTE — Telephone Encounter (Signed)
anywhere

## 2015-09-26 NOTE — Telephone Encounter (Signed)
Amanda from [facility] , nurse called in and said that since pt has been discharged he has gained 4 pounds.  Legs are swollen and she feels as if he needs to be seen as soon has possible since being discharged from [place].  Is there a time that you could work him in or what do you suggest?    Her number is [phone]

## 2015-09-27 ENCOUNTER — Ambulatory Visit (INDEPENDENT_AMBULATORY_CARE_PROVIDER_SITE_OTHER): Payer: Commercial Managed Care - HMO | Admitting: Internal Medicine

## 2015-09-27 ENCOUNTER — Other Ambulatory Visit (INDEPENDENT_AMBULATORY_CARE_PROVIDER_SITE_OTHER): Payer: Commercial Managed Care - HMO

## 2015-09-27 VITALS — BP 120/62 | HR 8 | Temp 98.0°F | Resp 16 | Wt 176.0 lb

## 2015-09-27 DIAGNOSIS — N183 Chronic kidney disease, stage 3 unspecified: Secondary | ICD-10-CM

## 2015-09-27 DIAGNOSIS — I10 Essential (primary) hypertension: Secondary | ICD-10-CM

## 2015-09-27 DIAGNOSIS — E118 Type 2 diabetes mellitus with unspecified complications: Secondary | ICD-10-CM | POA: Diagnosis not present

## 2015-09-27 DIAGNOSIS — R600 Localized edema: Secondary | ICD-10-CM

## 2015-09-27 DIAGNOSIS — Z23 Encounter for immunization: Secondary | ICD-10-CM

## 2015-09-27 DIAGNOSIS — L03119 Cellulitis of unspecified part of limb: Secondary | ICD-10-CM

## 2015-09-27 LAB — COMPREHENSIVE METABOLIC PANEL
ALBUMIN: 3.4 g/dL — AB (ref 3.5–5.2)
ALT: 32 U/L (ref 0–53)
AST: 28 U/L (ref 0–37)
Alkaline Phosphatase: 103 U/L (ref 39–117)
BILIRUBIN TOTAL: 0.7 mg/dL (ref 0.2–1.2)
BUN: 40 mg/dL — ABNORMAL HIGH (ref 6–23)
CALCIUM: 8.4 mg/dL (ref 8.4–10.5)
CO2: 29 meq/L (ref 19–32)
CREATININE: 1.88 mg/dL — AB (ref 0.40–1.50)
Chloride: 100 mEq/L (ref 96–112)
GFR: 35.89 mL/min — ABNORMAL LOW (ref >60.00)
Glucose, Bld: 128 mg/dL — ABNORMAL HIGH (ref 70–99)
Potassium: 4.2 mEq/L (ref 3.5–5.1)
Sodium: 135 mEq/L (ref 135–145)
Total Protein: 5.6 g/dL — ABNORMAL LOW (ref 6.0–8.3)

## 2015-09-27 LAB — HEMOGLOBIN A1C: HEMOGLOBIN A1C: 5.4 % (ref 4.6–6.5)

## 2015-09-27 MED ORDER — COLCHICINE 0.6 MG PO TABS: ORAL_TABLET | ORAL | 2 refills | Status: DC

## 2015-09-27 NOTE — Patient Instructions (Addendum)
  Test(s) ordered today. Your results will be released to [place] (or called to you) after review, usually within 72hours after test completion. If any changes need to be made, you will be notified at that same time.   Flu vaccine administered today.   Medications reviewed and updated.  Changes include a different dosing of colchicine if needed for gout.   Your prescription(s) have been submitted to your pharmacy. Please take as directed and contact our office if you believe you are having problem(s) with the medication(s).   Please followup in 3 months

## 2015-09-27 NOTE — Progress Notes (Signed)
Subjective:    Patient ID: [name], male    DOB: 1922/09/26, 82 y.o.   MRN: [id number]  HPI The patient is here for follow up from his hospitalization - came in today due to increasing weight.  He was admitted 09/09/15 and discharged 09/23/15.  He was admitted for cellulitis. He has had several office visits and hospitalizations for recurrent lower extremity cellulitis.    When discharged he was on lasix 80 mg in the morning. He did know he was supposed to take the 40 mg in the afternoon.  His weight started increasing over the past couple of days.  He realized yesterday he was supposed to be taking it and restarted the afternoon dose and his weight decreased again.   His weight at home: Sat 171, sun 173.8, Monday 175, today 172.3.    His legs are minmally tender.  Some pain left ankle thought to be gout. He has been taking the colchicine frequently for gout in left ankle.  His legs are not red.  He denies any open wounds on the legs.  He wears his compression socks daily.  He elevates his legs when able.    He denies fever.  His appetite is getting better.  He denies nausea and lightheadedness.  Medications and allergies reviewed with patient and updated if appropriate.  Patient Active Problem List   Diagnosis Date Noted  . Tenosynovitis of ankle 09/20/2015  . Hernia of abdominal cavity   . Lactic acidosis 09/17/2015  . Metabolic acidosis [id number]  . Hyperkalemia 09/14/2015  . Throat cancer ([place])   . Anemia due to bone marrow failure ([place])   . Recurrent cellulitis of lower leg 09/09/2015  . Lymphoproliferative disorder ([place])   . QT prolongation   . GERD (gastroesophageal reflux disease) 10/04/2014  . Chronic diastolic congestive heart failure ([place]) 10/04/2014  . Impingement syndrome of right shoulder region 05/21/2014  . Severe aortic stenosis 05/14/2014  . S/P TAVR (transcatheter aortic valve replacement) 05/14/2014  . Severe aortic insufficiency 04/05/2014  .  Depression 10/02/2013  . Left leg cellulitis 08/01/2013  . Aortic atherosclerosis ([place]) 01/06/2013  . Paroxysmal atrial fibrillation ([place]) 01/06/2013  . Lower extremity edema 01/06/2013  . CKD (chronic kidney disease), stage III 01/06/2013  . S/P AVR 12/13/2012  . Protein-calorie malnutrition, severe ([place]) 12/13/2012  . Hypothyroidism 02/27/2011  . Restless legs 02/27/2011  . Other pancytopenia ([place]) 12/06/2009  . HYPERURICEMIA, ASYMPTOMATIC 07/25/2009  . HYPERGLYCEMIA, FASTING 01/05/2009  . CAD, NATIVE VESSEL 12/21/2008  . Type 2 diabetes mellitus with complication ([place]) [id number]  . Aortic valve disorder 05/15/2008  . Leg pain 06/23/2007  . ACTION TREMOR 01/14/2007  . POPLITEAL CYST, RIGHT 11/05/2006  . Gout 05/13/2006  . Thrombocytopenia ([place]) 05/13/2006  . ANXIETY 05/13/2006  . Essential hypertension 05/13/2006  . CVA 05/13/2006  . [place] DISEASE 05/13/2006  . Splenomegaly 05/13/2006    Current Outpatient Prescriptions on File Prior to Visit  Medication Sig Dispense Refill  . allopurinol (ZYLOPRIM) 100 MG tablet Take 100 mg by mouth daily.    [name] Kitchen amiodarone (PACERONE) 200 MG tablet Take 1 tablet by mouth daily 90 tablet 0  . Cholecalciferol (VITAMIN D) 1000 UNITS capsule Take 1 capsule (1,000 Units total) by mouth daily. 90 capsule 3  . clonazePAM (KLONOPIN) 0.5 MG tablet Take 0.25 mg by mouth daily as needed (Take 1/2 tablet once daily as needed if you wake up early).    . ferrous sulfate 325 (65 FE) MG tablet  Take 325 mg by mouth 2 (two) times daily with a meal.    . fish oil-omega-3 fatty acids 1000 MG capsule Take 1 g by mouth daily.      . Flaxseed, Linseed, (FLAX SEED OIL) 1000 MG CAPS Take 1 capsule by mouth daily.     . furosemide (LASIX) 80 MG tablet Take 80mg  in the morning and 40mg  in the afternoon 60 tablet 6  . levothyroxine (SYNTHROID, LEVOTHROID) 25 MCG tablet Take 25 mcg by mouth daily before breakfast.    . metolazone (ZAROXOLYN) 2.5 MG tablet  Take 1 tablet (2.5 mg total) by mouth daily as needed (edema). 30 tablet 3  . Multiple Vitamin (MULTIVITAMIN) tablet Take 1 tablet by mouth daily.      [name] Kitchen omeprazole (PRILOSEC) 20 MG capsule TAKE 1 CAPSULE EVERY DAY  (REPLACES  PANTOPRAZOLE) 90 capsule 3  . potassium chloride (K-DUR) 10 MEQ tablet Take 1 tablet (10 mEq total) by mouth daily. 90 tablet 1  . propranolol (INDERAL) 10 MG tablet Take 1 tablet (10 mg total) by mouth 2 (two) times daily. 180 tablet 3  . sertraline (ZOLOFT) 50 MG tablet TAKE 1 TABLET (50 MG TOTAL) BY MOUTH AT BEDTIME. 90 tablet 0  . traMADol (ULTRAM) 50 MG tablet Take 50 mg by mouth every 8 (eight) hours as needed (pain). Reported on 06/28/2015    . vitamin C (ASCORBIC ACID) 500 MG tablet Take 1 tablet (500 mg total) by mouth daily. 90 tablet 3   No current facility-administered medications on file prior to visit.     Past Medical History:  Diagnosis Date  . Action tremor   . Anxiety   . Aortic stenosis    a. Previously severe -> s/p minimally invasive tissue AVR with Dr. Evelina Dun at [facility] 01/2011 (pre-AVR cath with no obs CAD);  b. 05/2014 s/p TAVR (23 mm Edwards Sapien 3).  . Bacteremia    a. 12/2012 - S bovis;  b. 12/2012 TEE w/o veg;  c. Seeing ID->Rocephin therapy extended to 01/25/2013 via PICC for possible endocarditis (No veg on TEE).  . Bradycardia   . Cellulitis 09/09/2015   LEFT LEG  . Cellulitis of left leg 10/11-16/2012  . Chronic diastolic CHF (congestive heart failure) ([place])    a. 12/15/2012 TEE: EF 60-65%, no veg.  . CKD (chronic kidney disease), stage III   . Colon polyp   . Degenerative disc disease   . Diabetes mellitus    "borderline" (01/05/2013)  . Diverticulosis   . GERD (gastroesophageal reflux disease)   . Gout   . H/O hiatal hernia   . High cholesterol   . History of blood transfusion 01/2011; 11/2012  . Hypertension   . Hypotension   . Hypothyroidism   . Joint effusion, knee    left knee  . Leukopenia    Chronic pancytopenia  .  PAF (paroxysmal atrial fibrillation) (HCC)    a. amiodarone therapy; not felt to be a candidate for anticoagulation with pancytopenia. b. Back in AF 09/2014 - decision was made to pursue rate control only.  . Pancytopenia ([place])    a. possible chronic lymphoproliferative disorder or splenic lymphoma.  . QT prolongation   . S/P TAVR (transcatheter aortic valve replacement)    a. 05/14/2014 TAVR: 23 mm Edwards Sapien 3 transcatheter heart valve placed valve-in-valve for prosthetic valve dysfunction via open right transfemoral approach  . Splenomegaly   . Stroke ([place])   . Synovial cyst of popliteal space   . Throat cancer (  [place])    s/p lasered  . Thrombocytopenia (HCC)    Dr. Benay Spice    Past Surgical History:  Procedure Laterality Date  . AORTIC VALVE REPLACEMENT  01/23/2011   via minimally invasive approach per Dr Evelina Dun, [facility]  . CARDIAC CATHETERIZATION    . CARDIAC VALVE REPLACEMENT    . CHOLECYSTECTOMY    . EXCISIONAL HEMORRHOIDECTOMY    . INGUINAL HERNIA REPAIR Right   . joint effusion     left knee  . LESION REMOVAL Right 08/02/2015   Procedure: EXCISION  RIGHT EAR SKIN CANCER;  Surgeon: Rozetta Nunnery, MD;  Location: [facility];  Service: ENT;  Laterality: Right;  LOCAL  . 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 SURGERY SCROTAL / TESTICULAR     "removed one" (01/05/2013)  . TEE WITHOUT CARDIOVERSION N/A 12/15/2012   Procedure: TRANSESOPHAGEAL ECHOCARDIOGRAM (TEE);  Surgeon: [name], MD;  Location: [facility];  Service: Cardiovascular;  Laterality: N/A;  . TEE WITHOUT CARDIOVERSION N/A 03/26/2014   Procedure: TRANSESOPHAGEAL ECHOCARDIOGRAM (TEE);  Surgeon: Josue Hector, MD;  Location: [facility];  Service: Cardiovascular;  Laterality: N/A;  . TEE WITHOUT CARDIOVERSION N/A 05/14/2014   Procedure:  TRANSESOPHAGEAL ECHOCARDIOGRAM (TEE);  Surgeon: [name], MD;  Location: [facility];  Service: Open Heart Surgery;  Laterality: N/A;  . TRANSCATHETER AORTIC VALVE REPLACEMENT, TRANSFEMORAL Right 05/14/2014   Procedure: TRANSCATHETER AORTIC VALVE REPLACEMENT, TRANSFEMORAL;  Surgeon: [name], MD;  Location: [facility];  Service: Open Heart Surgery;  Laterality: Right;    Social History   Social History  . Marital status: Widowed    Spouse name: N/A  . Number of children: 3  . Years of education: N/A   Occupational History  . Retired Agricultural consultant Retired   Social History Main Topics  . Smoking status: Former Smoker    Packs/day: 0.75    Years: 24.00    Types: Cigarettes    Quit date: 01/11/1962  . Smokeless tobacco: Never Used  . Alcohol use No  . Drug use: No  . Sexual activity: No   Other Topics Concern  . Not on file   Social History Narrative   [place] at [place] farm outside of [place]   Lives with his wife   Smoked until 1964 about 7 cigarettes a day for 30 years   No alcohol history.   No drug history   Very active, no regimented exercise                Family History  Problem Relation Age of Onset  . Lung cancer Mother   . Heart disease Father   . Colon cancer Other   . Stroke Other   . Cancer Brother     Review of Systems  Constitutional: Negative for appetite change, chills and fever.  Respiratory: Negative for cough, shortness of breath and wheezing.   Cardiovascular: Positive for leg swelling. Negative for chest pain and palpitations.  Gastrointestinal: Negative for abdominal pain and nausea.  Neurological: Negative for light-headedness.       Objective:   Vitals:   09/27/15 0934  BP: 120/62  Pulse: (!) 8  Resp: 16  Temp: 98 F (36.7 C)   Filed Weights   09/27/15 0934  Weight: 176 lb (79.8 kg)   Body mass index is 25.25 kg/m.  Physical Exam    Constitutional: Appears well-developed and well-nourished. No distress.  HENT:  Head: Normocephalic  and atraumatic.  Neck: Neck supple. No tracheal deviation present. No thyromegaly present.  Cardiovascular: Normal rate, regular rhythm and normal heart sounds.   No murmur heard. No carotid bruit  Pulmonary/Chest: Effort normal and breath sounds normal. No respiratory distress. No has no wheezes. No rales.  Musculoskeletal/vascular: chronic 2+  Edema - wearing compression socks. No warmth. Minimal tenderness - increased ankle tenderness .  Lymphadenopathy: No cervical adenopathy.  Skin: Skin is warm and dry. Not diaphoretic. no open wounds on legs Psychiatric: Normal mood and affect. Behavior is normal.     Assessment & Plan:    See Problem List for Assessment and Plan of chronic medical problems.

## 2015-09-28 ENCOUNTER — Encounter: Payer: Self-pay | Admitting: Internal Medicine

## 2015-09-28 NOTE — Assessment & Plan Note (Signed)
Sugars have been controlled at home Check 1c

## 2015-09-28 NOTE — Assessment & Plan Note (Signed)
BP well controlled Current regimen effective and well tolerated Continue current medications at current doses  

## 2015-09-28 NOTE — Assessment & Plan Note (Signed)
Recently in hospital No evidence of active cellulitis at this time, but high risk for recurrence given history and pancytopenia Continue elevation, compression sock and current lasix dose Monitor legs closely - he know he needs to be seen immediately if he has any signs of infection

## 2015-09-28 NOTE — Assessment & Plan Note (Signed)
Chronic He was gaining weight since discharge, but restarted the afternoon dose of lasix Weight has decreased and been stable Continue lasix 80 mg in morning and 40 mg in afternoon Continue to monitor weights daily at home

## 2015-09-28 NOTE — Assessment & Plan Note (Signed)
cmp

## 2015-09-29 ENCOUNTER — Telehealth: Payer: Self-pay | Admitting: *Deleted

## 2015-09-29 NOTE — Telephone Encounter (Signed)
Have him call us tomorrow with his weight.  No change in meds today.

## 2015-09-29 NOTE — Telephone Encounter (Signed)
Left msg on triage stating out seeing pt today wanting to inform weight is up 2.4 pds from yesterday. Weight today 172.8...Johny Chess

## 2015-09-29 NOTE — Telephone Encounter (Signed)
Notified [name] w/MD response...[name]

## 2015-09-30 ENCOUNTER — Telehealth: Payer: Self-pay | Admitting: Emergency Medicine

## 2015-09-30 ENCOUNTER — Telehealth: Payer: Self-pay | Admitting: Nurse Practitioner

## 2015-09-30 NOTE — Telephone Encounter (Signed)
Patient called reschedule appointments. 09/30/15

## 2015-09-30 NOTE — Telephone Encounter (Signed)
Angie called and wanted to let you know pt is up 3 lbs in 3 days.  9/20- 170 lbs 9/21- 172 lbs 9/22- 173 lbs His blood pressure is still pretty stable at 112/66. Pulse is 84. O2 sats 99% on room air. Resp were 16. He is not in an kind of distress but still complain about left leg around his ankle giving him pain. He has +1 edema bilateral lower extremities and good peddle pulses. She just wanted to give up an update. Thanks.

## 2015-09-30 NOTE — Telephone Encounter (Signed)
Please advise if any changes need to be made or pt needs to be seen.

## 2015-09-30 NOTE — Telephone Encounter (Signed)
Have him call us Monday wth his weights over the weekend - no change.  Make sure he is eating a low salt in his diet over the weeken.d

## 2015-10-03 ENCOUNTER — Other Ambulatory Visit: Payer: Self-pay

## 2015-10-03 ENCOUNTER — Telehealth: Payer: Self-pay | Admitting: Internal Medicine

## 2015-10-03 ENCOUNTER — Telehealth: Payer: Self-pay

## 2015-10-03 NOTE — Patient Outreach (Signed)
See next nursing note for home visit physical assessment on 10/03/2015.  Unable to enter note due to wrong visit type selected.   Tomasa Rand, RN, BSN, CEN [facility] [employer] [phone]

## 2015-10-03 NOTE — Patient Outreach (Signed)
[facility]) Care Management   10/03/2015  [name] March 07, 1922 [id number]  [name] is an 82 y.o. male  Subjective: Arrived for scheduled home visit. Patient reports home health nurse has already been to see him.  Reports that he weight continue to increase atleast 1-2 pounds per day. States that he is having left ankle pain ( 1/10)  States that his left leg is red and warm and swollen.  Reports that he is limiting his salt intake. Reports that he is weak.  States that he is having some occasional difficulty sleeping. Reports taking all medications as prescribed. Reports daily monitoring of blood pressure, heart rate and CBG.  CBG range 130-170.  Today reading of 162.  Denies taking any meds for DM.  Reports that he lives alone. Has 3 daughters that live out of town.  States that he has a nephew who lives next door and several friends that check on him 2-3 times per week.  Reports that he drives and is independent.   Objective:  See vitals signs report from telephone encounter today.  Todays weight of 175.4,  Weight log:  9/18     175.0                       9/19     172.3                       9/20     170.4                       9/21     172.8                       9/22     173.2                       9/23     173.6                       9/24     174.2          Today  9/25      175.4 Review of Systems  HENT: Negative.   Eyes:       Has an appointment with eye doctor in December.   Respiratory: Negative.   Cardiovascular: Positive for leg swelling.       Denies chest pain.   Gastrointestinal: Negative.        Reports daily bowel movements  Genitourinary: Positive for frequency.  Musculoskeletal:       Reports left ankle pain.  Skin:       Reports discoloration of both legs and arms.  Reports left leg is warm and red.   Neurological: Positive for weakness.  Endo/Heme/Allergies: Bruises/bleeds easily.  Psychiatric/Behavioral: Negative.     Physical  Exam  Constitutional: He is oriented to person, place, and time. He appears well-developed and well-nourished.  Cardiovascular: Normal rate and intact distal pulses.   Irregular radial pulse.  Respiratory: Effort normal and breath sounds normal.  Lungs clear. Patient used his incentive spirometer during home visit and was able to pull 2500 ml. The max of the device.  GI: Soft. Bowel sounds are normal.  Musculoskeletal: Normal range of motion. He exhibits edema.  Lower legs shinny. 2 plus edema to both legs.  Left leg more red and slightly  warmer than right. Both legs and arm discolored. Patient reports that he bruises easily. Brisk cap refill to both feet. Able to wiggle toes upon command.  Neurological: He is alert and oriented to person, place, and time.  Skin: Skin is warm and dry.  Psychiatric: He has a normal mood and affect. His behavior is normal. Judgment and thought content normal.    Encounter Medications:   Outpatient Encounter Prescriptions as of 10/03/2015  Medication Sig Note  . allopurinol (ZYLOPRIM) 100 MG tablet Take 100 mg by mouth daily.   [name] Kitchen amiodarone (PACERONE) 200 MG tablet Take 1 tablet by mouth daily   . Cholecalciferol (VITAMIN D) 1000 UNITS capsule Take 1 capsule (1,000 Units total) by mouth daily.   . clonazePAM (KLONOPIN) 0.5 MG tablet Take 0.25 mg by mouth daily as needed (Take 1/2 tablet once daily as needed if you wake up early).   . colchicine 0.6 MG tablet Take 2 tabs once and then 1 tab one hour later for gout flare   . ferrous sulfate 325 (65 FE) MG tablet Take 325 mg by mouth 2 (two) times daily with a meal.   . fish oil-omega-3 fatty acids 1000 MG capsule Take 1 g by mouth daily.     . Flaxseed, Linseed, (FLAX SEED OIL) 1000 MG CAPS Take 1 capsule by mouth daily.    . furosemide (LASIX) 80 MG tablet Take 80mg  in the morning and 40mg  in the afternoon 10/03/2015: Takes afternoon dose at 2:30 pm .. Is taking 80 mg in am and 40 in the afternoon  .  levothyroxine (SYNTHROID, LEVOTHROID) 25 MCG tablet Take 25 mcg by mouth daily before breakfast.   . metolazone (ZAROXOLYN) 2.5 MG tablet Take 1 tablet (2.5 mg total) by mouth daily as needed (edema). (Patient not taking: Reported on 10/03/2015) 09/26/2015: Patient reports that he has to call Dr. Burt Knack prior to taking this medication.  . Multiple Vitamin (MULTIVITAMIN) tablet Take 1 tablet by mouth daily.     [name] Kitchen omeprazole (PRILOSEC) 20 MG capsule TAKE 1 CAPSULE EVERY DAY  (REPLACES  PANTOPRAZOLE)   . potassium chloride (K-DUR) 10 MEQ tablet Take 1 tablet (10 mEq total) by mouth daily.   . propranolol (INDERAL) 10 MG tablet Take 1 tablet (10 mg total) by mouth 2 (two) times daily.   . sertraline (ZOLOFT) 50 MG tablet TAKE 1 TABLET (50 MG TOTAL) BY MOUTH AT BEDTIME.   . traMADol (ULTRAM) 50 MG tablet Take 50 mg by mouth every 8 (eight) hours as needed (pain). Reported on 06/28/2015 09/26/2015: Reports no prescription for this medication.   . vitamin C (ASCORBIC ACID) 500 MG tablet Take 1 tablet (500 mg total) by mouth daily.    No facility-administered encounter medications on file as of 10/03/2015.     Functional Status:   In your present state of health, do you have any difficulty performing the following activities: 10/03/2015 09/09/2015  Hearing? Tempie Donning  Vision? N N  Difficulty concentrating or making decisions? N N  Walking or climbing stairs? Y Y  Dressing or bathing? N N  Doing errands, shopping? N N  Preparing Food and eating ? N -  Using the Toilet? N -  In the past six months, have you accidently leaked urine? N -  Do you have problems with loss of bowel control? N -  Managing your Medications? N -  Managing your Finances? N -  Housekeeping or managing your Housekeeping? N -  Some recent data might be  hidden    Fall/Depression Screening:    PHQ 2/9 Scores 10/03/2015 09/06/2015 05/30/2015 05/21/2014 01/15/2013 06/15/2011 03/12/2011  PHQ - 2 Score 0 0 0 0 0 0 0   Fall Risk  10/03/2015 09/06/2015  05/30/2015 05/21/2014 11/23/2013  Falls in the past year? No No No No Yes  Number falls in past yr: - - - - 1  Injury with Fall? - - - - Yes  Risk for fall due to : - - - - Impaired balance/gait   Assessment:   (1) Reviewed [facility] program with patient. Provided new patient packet.  Written consent obtained. Contact card and magnet provided.  (2) active with [facility] home health nursing and physical therapy. (3) 5 pound weight gain in 6 days.  (4) reports following low salt diet. (5) CBG range 130-160's.  (6) left ankle pain, warmth, swelling, Patient wearing compression hose. (7) pending follow up with cancer center on 10/07/2015 (8) lives alone and is able to manage well per patient.  Ambulates without any assistance devices.  (9) cardiology follow up planned in December per patient.  Plan:  (1) consent scanned in to chart. Patient will continue to get weekly transition of care outreaches. (2) physical therapist arrived during my home visit. Collabortation to assist with avoiding readmission. (3) placed call to MD office to report findings of this home visit.  Left message with Rachel Bo. Informed patient that I would call him when I heard back from MD. (4) Provided CHF folder, low salt poster. Reviewed with patient how to read labels and foods to avoid. Encouraged salt substitute. (5)Reviewed with patient the need to avoid sweets, candies. Encouraged patient to continue to self manage. (6) notified MD. (7) encouraged patient to keep appointment. (8) reviewed safety measures with patient. Reviewed reasons to call 911. (9) reviewed with patient to notify cardiology of weight gain as directed in past.   Care planning and goal setting with patient during home visit. Primary goal is to avoid readmission.    This note sent to MD. Next outreach to patient in 7 days.  [facility] CM Care Plan Problem One   Flowsheet Row Most Recent Value  Care Plan Problem One  Recent hospital admission related to  cellulitis of leg  Role Documenting the Problem One  Care Management [place] for Problem One  Active  [facility] Long Term Goal (31-90 days)  Patient will report no readmissions in the next 31 days.  [facility] Long Term Goal Start Date  09/26/15  Interventions for Problem One Long Term Goal  home visit completed. reviewed reasons to call MD.   [facility] CM Short Term Goal #1 (0-30 days)  Patient will record CBG daily for the next 30 days.  [facility] CM Short Term Goal #1 Start Date  09/26/15  Interventions for Short Term Goal #1  Encouraged patient to avoid sweets and continue to monitor CBG daily. Review reasons to call Md for high or low readings.  [facility] CM Short Term Goal #2 (0-30 days)  Patient will call MD for worsening leg infection in the next 30 days.  [facility] CM Short Term Goal #2 Start Date  09/26/15  Interventions for Short Term Goal #2  reviewed signs and symptoms or infection, fever. placed call to MD to report increased weight and finding of leg today during home visit.  [facility] CM Short Term Goal #3 (0-30 days)  Patient will report weighing daily for the next 30 days.  [facility] CM Short Term Goal #3 Start  Date  09/26/15  Interventions for Short Tern Goal #3  reviewed current log. Encouraged patient to continue to keep accurate records. Reviewed when to call MD for a weight gain of more than 2-3 overnight or 5 pounds in a week. reviewed heart failure zones. Provided CHF educational packet. Provided low salt poster and reviewed reading labels.      Tomasa Rand, RN, BSN, CEN [facility] [employer] [phone]

## 2015-10-03 NOTE — Telephone Encounter (Signed)
Please advise what should be done.  

## 2015-10-03 NOTE — Telephone Encounter (Signed)
Home Health Cert/Plan of Care received (09/25/2015 - 11/23/2015) and placed on MD's desk for signature

## 2015-10-03 NOTE — Telephone Encounter (Signed)
[name] Is calling to inform you that his weight is:   09/28/2015- 170.4 09/29/2015- 172.8 09/30/2015- 173.2 10/01/2015- 173.6 10/02/2015- 174.2 10/03/2015- 175.4  Also, his left leg is slightly warm to the touch and red. He has 2+ swelling.  Please call amanda or the patient

## 2015-10-03 NOTE — Telephone Encounter (Signed)
I think he needs to take a dose of his metolazone to get some of the fluid off.  I would expect the redness to improve if the swelling goes down.

## 2015-10-04 ENCOUNTER — Other Ambulatory Visit: Payer: Self-pay

## 2015-10-04 ENCOUNTER — Inpatient Hospital Stay (HOSPITAL_COMMUNITY)
Admission: EM | Admit: 2015-10-04 | Discharge: 2015-10-09 | DRG: 603 | Disposition: A | Discharge status: Home / Self Care | Payer: Commercial Managed Care - HMO | Attending: Internal Medicine | Admitting: Internal Medicine

## 2015-10-04 ENCOUNTER — Telehealth: Payer: Self-pay

## 2015-10-04 ENCOUNTER — Ambulatory Visit: Payer: Commercial Managed Care - HMO | Admitting: Internal Medicine

## 2015-10-04 ENCOUNTER — Encounter (HOSPITAL_COMMUNITY): Payer: Self-pay | Admitting: Emergency Medicine

## 2015-10-04 ENCOUNTER — Encounter (HOSPITAL_COMMUNITY): Payer: Self-pay

## 2015-10-04 ENCOUNTER — Ambulatory Visit (HOSPITAL_COMMUNITY)
Admission: EM | Admit: 2015-10-04 | Discharge: 2015-10-04 | Disposition: A | Discharge status: Home / Self Care | Payer: Commercial Managed Care - HMO | Attending: Family Medicine | Admitting: Family Medicine

## 2015-10-04 ENCOUNTER — Inpatient Hospital Stay: Payer: Commercial Managed Care - HMO | Admitting: Internal Medicine

## 2015-10-04 DIAGNOSIS — L03116 Cellulitis of left lower limb: Principal | ICD-10-CM | POA: Diagnosis present

## 2015-10-04 DIAGNOSIS — I5032 Chronic diastolic (congestive) heart failure: Secondary | ICD-10-CM | POA: Diagnosis present

## 2015-10-04 DIAGNOSIS — C946 Myelodysplastic disease, not classified: Secondary | ICD-10-CM | POA: Diagnosis present

## 2015-10-04 DIAGNOSIS — N183 Chronic kidney disease, stage 3 unspecified: Secondary | ICD-10-CM | POA: Diagnosis present

## 2015-10-04 DIAGNOSIS — E872 Acidosis, unspecified: Secondary | ICD-10-CM | POA: Diagnosis present

## 2015-10-04 DIAGNOSIS — N390 Urinary tract infection, site not specified: Secondary | ICD-10-CM | POA: Diagnosis present

## 2015-10-04 DIAGNOSIS — Z79899 Other long term (current) drug therapy: Secondary | ICD-10-CM

## 2015-10-04 DIAGNOSIS — Z66 Do not resuscitate: Secondary | ICD-10-CM | POA: Diagnosis present

## 2015-10-04 DIAGNOSIS — N184 Chronic kidney disease, stage 4 (severe): Secondary | ICD-10-CM | POA: Diagnosis present

## 2015-10-04 DIAGNOSIS — Z880 Allergy status to penicillin: Secondary | ICD-10-CM

## 2015-10-04 DIAGNOSIS — I480 Paroxysmal atrial fibrillation: Secondary | ICD-10-CM | POA: Diagnosis present

## 2015-10-04 DIAGNOSIS — D479 Neoplasm of uncertain behavior of lymphoid, hematopoietic and related tissue, unspecified: Secondary | ICD-10-CM | POA: Diagnosis present

## 2015-10-04 DIAGNOSIS — D61818 Other pancytopenia: Secondary | ICD-10-CM | POA: Diagnosis present

## 2015-10-04 DIAGNOSIS — Z85819 Personal history of malignant neoplasm of unspecified site of lip, oral cavity, and pharynx: Secondary | ICD-10-CM

## 2015-10-04 DIAGNOSIS — Z87891 Personal history of nicotine dependence: Secondary | ICD-10-CM

## 2015-10-04 DIAGNOSIS — I350 Nonrheumatic aortic (valve) stenosis: Secondary | ICD-10-CM

## 2015-10-04 DIAGNOSIS — K219 Gastro-esophageal reflux disease without esophagitis: Secondary | ICD-10-CM | POA: Diagnosis present

## 2015-10-04 DIAGNOSIS — Z952 Presence of prosthetic heart valve: Secondary | ICD-10-CM

## 2015-10-04 DIAGNOSIS — L03119 Cellulitis of unspecified part of limb: Secondary | ICD-10-CM | POA: Diagnosis not present

## 2015-10-04 DIAGNOSIS — B964 Proteus (mirabilis) (morganii) as the cause of diseases classified elsewhere: Secondary | ICD-10-CM | POA: Diagnosis present

## 2015-10-04 DIAGNOSIS — I5033 Acute on chronic diastolic (congestive) heart failure: Secondary | ICD-10-CM

## 2015-10-04 DIAGNOSIS — R7309 Other abnormal glucose: Secondary | ICD-10-CM | POA: Diagnosis present

## 2015-10-04 DIAGNOSIS — D619 Aplastic anemia, unspecified: Secondary | ICD-10-CM | POA: Diagnosis present

## 2015-10-04 DIAGNOSIS — I130 Hypertensive heart and chronic kidney disease with heart failure and stage 1 through stage 4 chronic kidney disease, or unspecified chronic kidney disease: Secondary | ICD-10-CM | POA: Diagnosis present

## 2015-10-04 DIAGNOSIS — E1165 Type 2 diabetes mellitus with hyperglycemia: Secondary | ICD-10-CM | POA: Diagnosis present

## 2015-10-04 DIAGNOSIS — E1122 Type 2 diabetes mellitus with diabetic chronic kidney disease: Secondary | ICD-10-CM | POA: Diagnosis present

## 2015-10-04 DIAGNOSIS — I10 Essential (primary) hypertension: Secondary | ICD-10-CM | POA: Diagnosis present

## 2015-10-04 DIAGNOSIS — B9689 Other specified bacterial agents as the cause of diseases classified elsewhere: Secondary | ICD-10-CM | POA: Diagnosis present

## 2015-10-04 DIAGNOSIS — L0390 Cellulitis, unspecified: Secondary | ICD-10-CM | POA: Diagnosis present

## 2015-10-04 DIAGNOSIS — Z86010 Personal history of colonic polyps: Secondary | ICD-10-CM

## 2015-10-04 DIAGNOSIS — E039 Hypothyroidism, unspecified: Secondary | ICD-10-CM | POA: Diagnosis present

## 2015-10-04 DIAGNOSIS — Z888 Allergy status to other drugs, medicaments and biological substances status: Secondary | ICD-10-CM

## 2015-10-04 LAB — COMPREHENSIVE METABOLIC PANEL
ALK PHOS: 112 U/L (ref 38–126)
ALT: 54 U/L (ref 17–63)
ANION GAP: 12 (ref 5–15)
AST: 67 U/L — ABNORMAL HIGH (ref 15–41)
Albumin: 3.6 g/dL (ref 3.5–5.0)
BILIRUBIN TOTAL: 1.8 mg/dL — AB (ref 0.3–1.2)
BUN: 36 mg/dL — ABNORMAL HIGH (ref 6–20)
CALCIUM: 8.9 mg/dL (ref 8.9–10.3)
CO2: 19 mmol/L — ABNORMAL LOW (ref 22–32)
Chloride: 104 mmol/L (ref 101–111)
Creatinine, Ser: 1.84 mg/dL — ABNORMAL HIGH (ref 0.61–1.24)
GFR calc non Af Amer: 30 mL/min — ABNORMAL LOW (ref >60)
GFR, EST AFRICAN AMERICAN: 35 mL/min — AB (ref >60)
GLUCOSE: 139 mg/dL — AB (ref 65–99)
Potassium: 3.9 mmol/L (ref 3.5–5.1)
Sodium: 135 mmol/L (ref 135–145)
TOTAL PROTEIN: 6 g/dL — AB (ref 6.5–8.1)

## 2015-10-04 LAB — CBC WITH DIFFERENTIAL/PLATELET
Basophils Absolute: 0 10*3/uL (ref 0.0–0.1)
Basophils Relative: 0 %
EOS ABS: 0 10*3/uL (ref 0.0–0.7)
EOS PCT: 0 %
HCT: 29.9 % — ABNORMAL LOW (ref 39.0–52.0)
Hemoglobin: 9.5 g/dL — ABNORMAL LOW (ref 13.0–17.0)
LYMPHS ABS: 0.5 10*3/uL — AB (ref 0.7–4.0)
Lymphocytes Relative: 10 %
MCH: 28.5 pg (ref 26.0–34.0)
MCHC: 31.8 g/dL (ref 30.0–36.0)
MCV: 89.8 fL (ref 78.0–100.0)
MONO ABS: 0.3 10*3/uL (ref 0.1–1.0)
Monocytes Relative: 7 %
Neutro Abs: 4.1 10*3/uL (ref 1.7–7.7)
Neutrophils Relative %: 83 %
PLATELETS: 25 10*3/uL — AB (ref 150–400)
RBC: 3.33 MIL/uL — AB (ref 4.22–5.81)
RDW: 17.6 % — AB (ref 11.5–15.5)
WBC: 5 10*3/uL (ref 4.0–10.5)

## 2015-10-04 LAB — TYPE AND SCREEN
ABO/RH(D): A POS
Antibody Screen: NEGATIVE

## 2015-10-04 LAB — URINALYSIS, ROUTINE W REFLEX MICROSCOPIC
Bilirubin Urine: NEGATIVE
GLUCOSE, UA: NEGATIVE mg/dL
Hgb urine dipstick: NEGATIVE
Ketones, ur: NEGATIVE mg/dL
LEUKOCYTES UA: NEGATIVE
Nitrite: NEGATIVE
PH: 5 (ref 5.0–8.0)
Protein, ur: NEGATIVE mg/dL
SPECIFIC GRAVITY, URINE: 1.011 (ref 1.005–1.030)

## 2015-10-04 LAB — CBG MONITORING, ED: GLUCOSE-CAPILLARY: 168 mg/dL — AB (ref 65–99)

## 2015-10-04 LAB — I-STAT CG4 LACTIC ACID, ED
LACTIC ACID, VENOUS: 2.12 mmol/L — AB (ref 0.5–1.9)
Lactic Acid, Venous: 3.16 mmol/L (ref 0.5–1.9)

## 2015-10-04 MED ORDER — PROPRANOLOL HCL 10 MG PO TABS
10.0000 mg | ORAL_TABLET | Freq: Two times a day (BID) | ORAL | Status: DC
  Administered 2015-10-05 – 2015-10-09 (×8): 10 mg via ORAL
  Filled 2015-10-04 (×12): qty 1

## 2015-10-04 MED ORDER — ACETAMINOPHEN 650 MG RE SUPP
650.0000 mg | Freq: Four times a day (QID) | RECTAL | Status: DC | PRN
Start: 2015-10-04 — End: 2015-10-09

## 2015-10-04 MED ORDER — TETANUS-DIPHTH-ACELL PERTUSSIS 5-2.5-18.5 LF-MCG/0.5 IM SUSP: 0.5000 mL | Freq: Once | INTRAMUSCULAR | Status: DC

## 2015-10-04 MED ORDER — CLONAZEPAM 0.5 MG PO TABS
0.2500 mg | ORAL_TABLET | Freq: Every day | ORAL | Status: DC | PRN
  Administered 2015-10-05 – 2015-10-09 (×3): 0.25 mg via ORAL
  Filled 2015-10-04 (×3): qty 1

## 2015-10-04 MED ORDER — SODIUM CHLORIDE 0.9 % IV BOLUS (SEPSIS)
1000.0000 mL | Freq: Once | INTRAVENOUS | Status: AC
  Administered 2015-10-04: 1000 mL via INTRAVENOUS

## 2015-10-04 MED ORDER — LEVOTHYROXINE SODIUM 25 MCG PO TABS
25.0000 ug | ORAL_TABLET | Freq: Every day | ORAL | Status: DC
  Administered 2015-10-05 – 2015-10-09 (×5): 25 ug via ORAL
  Filled 2015-10-04 (×5): qty 1

## 2015-10-04 MED ORDER — ONDANSETRON HCL 4 MG PO TABS: 4.0000 mg | ORAL_TABLET | Freq: Four times a day (QID) | ORAL | Status: DC | PRN

## 2015-10-04 MED ORDER — VANCOMYCIN HCL IN DEXTROSE 1-5 GM/200ML-% IV SOLN
1000.0000 mg | Freq: Once | INTRAVENOUS | Status: AC
  Administered 2015-10-04: 1000 mg via INTRAVENOUS
  Filled 2015-10-04: qty 200

## 2015-10-04 MED ORDER — VANCOMYCIN HCL 500 MG IV SOLR
500.0000 mg | INTRAVENOUS | Status: DC
  Filled 2015-10-04: qty 500

## 2015-10-04 MED ORDER — ONDANSETRON HCL 4 MG/2ML IJ SOLN: 4.0000 mg | Freq: Four times a day (QID) | INTRAMUSCULAR | Status: DC | PRN

## 2015-10-04 MED ORDER — ALLOPURINOL 100 MG PO TABS
100.0000 mg | ORAL_TABLET | Freq: Every day | ORAL | Status: DC
  Administered 2015-10-05 – 2015-10-09 (×5): 100 mg via ORAL
  Filled 2015-10-04 (×5): qty 1

## 2015-10-04 MED ORDER — SODIUM CHLORIDE 0.9 % IV BOLUS (SEPSIS)
500.0000 mL | Freq: Once | INTRAVENOUS | Status: AC
  Administered 2015-10-04: 500 mL via INTRAVENOUS

## 2015-10-04 MED ORDER — AMIODARONE HCL 200 MG PO TABS
200.0000 mg | ORAL_TABLET | Freq: Every day | ORAL | Status: DC
  Administered 2015-10-05 – 2015-10-09 (×4): 200 mg via ORAL
  Filled 2015-10-04 (×4): qty 1

## 2015-10-04 MED ORDER — CEFEPIME HCL 2 G IJ SOLR
2.0000 g | Freq: Once | INTRAMUSCULAR | Status: AC
  Administered 2015-10-04: 2 g via INTRAVENOUS
  Filled 2015-10-04: qty 2

## 2015-10-04 MED ORDER — DOCUSATE SODIUM 100 MG PO CAPS
100.0000 mg | ORAL_CAPSULE | Freq: Two times a day (BID) | ORAL | Status: DC
  Administered 2015-10-05 – 2015-10-09 (×9): 100 mg via ORAL
  Filled 2015-10-04 (×10): qty 1

## 2015-10-04 MED ORDER — SERTRALINE HCL 50 MG PO TABS
50.0000 mg | ORAL_TABLET | Freq: Every day | ORAL | Status: DC
  Administered 2015-10-05 – 2015-10-08 (×5): 50 mg via ORAL
  Filled 2015-10-04 (×5): qty 1

## 2015-10-04 MED ORDER — FERROUS SULFATE 325 (65 FE) MG PO TABS
325.0000 mg | ORAL_TABLET | Freq: Two times a day (BID) | ORAL | Status: DC
  Administered 2015-10-05 – 2015-10-09 (×9): 325 mg via ORAL
  Filled 2015-10-04 (×9): qty 1

## 2015-10-04 MED ORDER — ACETAMINOPHEN 325 MG PO TABS
650.0000 mg | ORAL_TABLET | Freq: Four times a day (QID) | ORAL | Status: DC | PRN
Start: 2015-10-04 — End: 2015-10-09

## 2015-10-04 NOTE — Telephone Encounter (Signed)
PA called back form urgent care, hung up before Dr Quay Burow was pulled from a room with a pt. Tried calling number listed below and also called [phone] and was unable to get an answer.

## 2015-10-04 NOTE — Telephone Encounter (Signed)
He needs to be evaluated - ED or urgent care

## 2015-10-04 NOTE — ED Provider Notes (Signed)
Patient with swollen red and left leg onset last night. States mild pain. Denies any shortness of breath. No other associated symptoms. Symptoms consistent with cellulitis he's had in the past. On exam alert nontoxic left lower extremity red and swollen and mildly tender  an leg involving knee lower leg and foot DP pulse 2+. No red streaks proximally. No inguinal nodes.    Orlie Dakin, MD 10/04/15 [phone]

## 2015-10-04 NOTE — ED Notes (Signed)
Informed Floor nurse of latest BP. Nurse would like to contact rapid response

## 2015-10-04 NOTE — Telephone Encounter (Signed)
Paperwork signed, faxed, copy sent to scan 

## 2015-10-04 NOTE — Telephone Encounter (Signed)
[id number] - PA at [facility] health urgent care wants Dr. Quay Burow to call him about the patient. Follow up once she comes out of room. Thank you.

## 2015-10-04 NOTE — Telephone Encounter (Signed)
[name] called back. Patient called her this morning saying he is feeling worse.  Very thirsty has drink 4 glasses of water.  HR was 115  Blood sugar 191.  L Leg is hott up to knee  He is feeling nerves  He has a chill and a headache. Weight is 174  She just wants to know what to advise the patient to do. Or if he needs to make an app.   [phone]

## 2015-10-04 NOTE — Patient Outreach (Signed)
Care Coordination: Return call from Dr. Quay Burow office. Spoke with Lovena Le who reports that MD wants patient to take a metazone today.  Lovena Le states she will call patient to inform.   While on the phone with MD office, patient called.  When I hung up patient called again. States that he is very thirsty, reports drinking 4 glasses of water today,  States his heart rate is 115. Reports CBG is 191 today.  States that he feels nervous. Reports that his left leg is hot up to his knee.  Reports he went to get the paper this am and had a chill. Reports a headache.  States that his weight today is 174.  Patient states that he does not know what to do.    I placed a call back to MD office and left a message.   PLAN: Awaiting a call back from MD office.  Tomasa Rand, RN, BSN, CEN [facility] [employer] [phone]

## 2015-10-04 NOTE — ED Notes (Signed)
Report given to Blanch Media, RN at this time.  Receiving nurse denies having any further questions at this time.

## 2015-10-04 NOTE — ED Triage Notes (Signed)
Pt sent here from UC. Pt has recurrent cellulitis in left lower extremity. Extremity appears red. Warm to touch.

## 2015-10-04 NOTE — Progress Notes (Signed)
Pharmacy Antibiotic Note  [name] is a 82 y.o. male admitted on 10/04/2015 with cellulitis of left lower extremity.  Pharmacy has been consulted for vancomycin dosing. Patient afebrile and WBC within normal limits. Lactate elevated at 2.12. Of note, patient has tolerated cephalosporins in the past (cefepime x1 per physician).   Plan: Vancomycin 1g IV x1 in ED Cefepime 2g IV x1 in ED - physician ordered  Vancomycin 500 mg IV every 24 hours.  Goal trough 10-15 mcg/mL.  Monitor renal function, clinical picture, culture results, and vancomycin trough as needed.   Height: 5\' 10"  (177.8 cm) Weight: 175 lb (79.4 kg) IBW/kg (Calculated) : 73  Temp (24hrs), Avg:99 F (37.2 C), Min:98.6 F (37 C), Max:99.3 F (37.4 C)   Recent Labs Lab 10/04/15 1512 10/04/15 1526 10/04/15 1817  WBC 5.0  --   --   CREATININE 1.84*  --   --   LATICACIDVEN  --  3.16* 2.12*    Estimated Creatinine Clearance: 27 mL/min (by C-G formula based on SCr of 1.84 mg/dL (H)).    Allergies  Allergen Reactions  . Penicillins Other (See Comments)    08/15/2015:  Tolerated Cefepime Does not remember rxn (~1950) Has patient had a PCN reaction causing immediate rash, facial/tongue/throat swelling, SOB or lightheadedness with hypotension:YES Has patient had a PCN reaction causing severe rash involving mucus membranes or skin necrosis: NO Has patient had a PCN reaction that required hospitalization NO Has patient had a PCN reaction occurring within the last 10 years: NO If all of the above answers are "NO", then may proceed with Cephalosporin use.  [name] Kitchen Neomycin-Bacitracin Zn-Polymyx Rash and Other (See Comments)    ? Reaction (thinks he remembers redness)    Antimicrobials this admission: 9/26 Vanc >>  9/26 Cefepime x1 in ED   Dose adjustments this admission: N/A  Microbiology results: pending   Thank you for allowing pharmacy to be a part of this patient's care.  Argie Ramming, PharmD Pharmacy Resident   Pager [phone] 10/04/15 6:24 PM

## 2015-10-04 NOTE — ED Notes (Signed)
Per Blanch Media, RN pt is to be moved to C-24 to await admission bed.

## 2015-10-04 NOTE — ED Notes (Signed)
Dr Chapman at bedside.

## 2015-10-04 NOTE — H&P (Signed)
History and Physical    [name] [id number] DOB: 1922/12/24 DOA: 10/04/2015  PCP: Binnie Rail, MD Consultants:  Devoria Albe - cardiology Patient coming from: home - lives alone; NOK: Brynda Rim, [phone]  Chief Complaint: cellulitis  HPI: [name] is a 82 y.o. male with medical history significant of AS s/p TAVR in 2016, CHF, lymphoproliferative disorder with severe pancytopenia, PAF, and multiple recent hospitalizations for cellulitis presenting with recurrent cellulitis.  Hospitalized in May for UTI (unrelated).  Hospitalized from 8/2-9/17 and again from 9/1-15/17 both with cellulitis in the left lower leg.  In between hospitalizations he was also treated with doxycycline for recurrence.  Went home on 9/15 and has had home health care since then.  It was doing fine yesterday according to the nurse.  This AM, there was little issue other than waking up with a headache and thirst.  Went to get newspaper and came back and noticed chills.  Through the day, he noticed warmth, progressive redness.  Able to eat and drink.  No further antibiotics since discharge.  Went to Urgent Care and was sent to the ER.     ED Course: Per Dr. Winfred Leeds: Patient with swollen red and left leg onset last night. States mild pain. Denies any shortness of breath. No other associated symptoms. Symptoms consistent with cellulitis he's had in the past. On exam alert nontoxic left lower extremity red and swollen and mildly tender  an leg involving knee lower leg and foot DP pulse 2+. No red streaks proximally. No inguinal nodes.   Review of Systems: As per HPI; otherwise 10 point review of systems reviewed and negative.   Ambulatory Status:  Has been working with PT, ambulates independently  Past Medical History:  Diagnosis Date  . Action tremor   . Anxiety   . Aortic stenosis    a. Previously severe -> s/p minimally invasive tissue AVR with Dr. Evelina Dun at [facility] 01/2011 (pre-AVR cath with  no obs CAD);  b. 05/2014 s/p TAVR (23 mm Edwards Sapien 3).  . Bacteremia    a. 12/2012 - S bovis;  b. 12/2012 TEE w/o veg;  c. Seeing ID->Rocephin therapy extended to 01/25/2013 via PICC for possible endocarditis (No veg on TEE).  . Bradycardia   . Cellulitis 09/09/2015   LEFT LEG  . Cellulitis of left leg 10/11-16/2012  . Chronic diastolic CHF (congestive heart failure) ([place])    a. 12/15/2012 TEE: EF 60-65%, no veg.  . CKD (chronic kidney disease), stage III   . Colon polyp   . Degenerative disc disease   . Diabetes mellitus    "borderline" (01/05/2013)  . Diverticulosis   . GERD (gastroesophageal reflux disease)   . Gout   . H/O hiatal hernia   . High cholesterol   . History of blood transfusion 01/2011; 11/2012  . Hypertension   . Hypotension   . Hypothyroidism   . Joint effusion, knee    left knee  . Leukopenia    Chronic pancytopenia  . PAF (paroxysmal atrial fibrillation) (HCC)    a. amiodarone therapy; not felt to be a candidate for anticoagulation with pancytopenia. b. Back in AF 09/2014 - decision was made to pursue rate control only.  . Pancytopenia ([place])    a. possible chronic lymphoproliferative disorder or splenic lymphoma.  . QT prolongation   . S/P TAVR (transcatheter aortic valve replacement)    a. 05/14/2014 TAVR: 23 mm Edwards Sapien 3 transcatheter heart valve placed valve-in-valve for  prosthetic valve dysfunction via open right transfemoral approach  . Splenomegaly   . Stroke ([place])   . Synovial cyst of popliteal space   . Throat cancer ([place])    s/p lasered  . Thrombocytopenia (HCC)    Dr. Benay Spice    Past Surgical History:  Procedure Laterality Date  . AORTIC VALVE REPLACEMENT  01/23/2011   via minimally invasive approach per Dr Evelina Dun, [facility]  . CARDIAC CATHETERIZATION    . CARDIAC VALVE REPLACEMENT    . CHOLECYSTECTOMY    . EXCISIONAL HEMORRHOIDECTOMY    . INGUINAL HERNIA REPAIR Right   . joint effusion     left knee  . LESION REMOVAL Right 08/02/2015    Procedure: EXCISION  RIGHT EAR SKIN CANCER;  Surgeon: Rozetta Nunnery, MD;  Location: [place];  Service: ENT;  Laterality: Right;  LOCAL  . 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 SURGERY SCROTAL / TESTICULAR     "removed one" (01/05/2013)  . TEE WITHOUT CARDIOVERSION N/A 12/15/2012   Procedure: TRANSESOPHAGEAL ECHOCARDIOGRAM (TEE);  Surgeon: [name], MD;  Location: [place];  Service: Cardiovascular;  Laterality: N/A;  . TEE WITHOUT CARDIOVERSION N/A 03/26/2014   Procedure: TRANSESOPHAGEAL ECHOCARDIOGRAM (TEE);  Surgeon: Josue Hector, MD;  Location: [place];  Service: Cardiovascular;  Laterality: N/A;  . TEE WITHOUT CARDIOVERSION N/A 05/14/2014   Procedure: TRANSESOPHAGEAL ECHOCARDIOGRAM (TEE);  Surgeon: [name], MD;  Location: [place];  Service: Open Heart Surgery;  Laterality: N/A;  . TRANSCATHETER AORTIC VALVE REPLACEMENT, TRANSFEMORAL Right 05/14/2014   Procedure: TRANSCATHETER AORTIC VALVE REPLACEMENT, TRANSFEMORAL;  Surgeon: [name], MD;  Location: [place];  Service: Open Heart Surgery;  Laterality: Right;    Social History   Social History  . Marital status: Widowed    Spouse name: N/A  . Number of children: 3  . Years of education: N/A   Occupational History  . Retired Agricultural consultant Retired   Social History Main Topics  . Smoking status: Former Smoker    Packs/day: 0.75    Years: 24.00    Types: Cigarettes    Quit date: 01/11/1962  . Smokeless tobacco: Never Used  . Alcohol use No  . Drug use: No  . Sexual activity: No   Other Topics Concern  . Not on file   Social History Narrative   [place]ives at his farm outside of [place]   Lives with his wife   Smoked until 1964 about 7 cigarettes a day for 30 years   No alcohol history.   No drug history   Very active, no regimented exercise                 Allergies  Allergen Reactions  . Penicillins Other (See Comments)    08/15/2015:  Tolerated Cefepime Does not remember rxn (~1950) Has patient had a PCN reaction causing immediate rash, facial/tongue/throat swelling, SOB or lightheadedness with hypotension:YES Has patient had a PCN reaction causing severe rash involving mucus membranes or skin necrosis: NO Has patient had a PCN reaction that required hospitalization NO Has patient had a PCN reaction occurring within the last 10 years: NO If all of the above answers are "NO", then may proceed with Cephalosporin use.  [name] Kitchen Neomycin-Bacitracin Zn-Polymyx Rash and Other (See Comments)    ? Reaction (thinks he remembers redness)  Family History  Problem Relation Age of Onset  . Lung cancer Mother   . Heart disease Father   . Colon cancer Other   . Stroke Other   . Cancer Brother     Prior to Admission medications   Medication Sig Start Date End Date Taking? Authorizing Provider  allopurinol (ZYLOPRIM) 100 MG tablet Take 100 mg by mouth daily.    Historical Provider, MD  amiodarone (PACERONE) 200 MG tablet Take 1 tablet by mouth daily 03/23/15   [name], MD  Cholecalciferol (VITAMIN D) 1000 UNITS capsule Take 1 capsule (1,000 Units total) by mouth daily. 01/05/11   Renella Cunas, MD  clonazePAM (KLONOPIN) 0.5 MG tablet Take 0.25 mg by mouth daily as needed (Take 1/2 tablet once daily as needed if you wake up early).    Historical Provider, MD  colchicine 0.6 MG tablet Take 2 tabs once and then 1 tab one hour later for gout flare 09/27/15   Binnie Rail, MD  ferrous sulfate 325 (65 FE) MG tablet Take 325 mg by mouth 2 (two) times daily with a meal.    Historical Provider, MD  fish oil-omega-3 fatty acids 1000 MG capsule Take 1 g by mouth daily.      Historical Provider, MD  Flaxseed, Linseed, (FLAX SEED OIL) 1000 MG CAPS Take 1 capsule by mouth daily.     Historical Provider, MD  furosemide (LASIX) 80 MG tablet Take 80mg   in the morning and 40mg  in the afternoon 06/10/15   [name], MD  levothyroxine (SYNTHROID, LEVOTHROID) 25 MCG tablet Take 25 mcg by mouth daily before breakfast.    Historical Provider, MD  metolazone (ZAROXOLYN) 2.5 MG tablet Take 1 tablet (2.5 mg total) by mouth daily as needed (edema). Patient not taking: Reported on 10/03/2015 12/22/14   [name], MD  Multiple Vitamin (MULTIVITAMIN) tablet Take 1 tablet by mouth daily.      Historical Provider, MD  omeprazole (PRILOSEC) 20 MG capsule TAKE 1 CAPSULE EVERY DAY  (REPLACES  PANTOPRAZOLE) 06/29/15   [name], MD  potassium chloride (K-DUR) 10 MEQ tablet Take 1 tablet (10 mEq total) by mouth daily. 02/04/14   [name], MD  propranolol (INDERAL) 10 MG tablet Take 1 tablet (10 mg total) by mouth 2 (two) times daily. 08/19/15   Cherene Altes, MD  sertraline (ZOLOFT) 50 MG tablet TAKE 1 TABLET (50 MG TOTAL) BY MOUTH AT BEDTIME. 03/29/15   Binnie Rail, MD  traMADol (ULTRAM) 50 MG tablet Take 50 mg by mouth every 8 (eight) hours as needed (pain). Reported on 06/28/2015    Historical Provider, MD  vitamin C (ASCORBIC ACID) 500 MG tablet Take 1 tablet (500 mg total) by mouth daily. 01/05/11   Renella Cunas, MD    Physical Exam: Vitals:   10/04/15 2024 10/04/15 2200 10/04/15 2200 10/04/15 2253  BP:  94/61 94/61 122/75  Pulse:  80 88 82  Resp:  16 16 16   Temp:   98.6 F (37 C) 98.8 F (37.1 C)  TempSrc:   Oral Oral  SpO2: 98% 99% 100% 100%  Weight:    81.1 kg (178 lb 12.7 oz)  Height:    5\' 10"  (1.778 m)     General:  Appears calm and comfortable and is NAD Eyes:  PERRL, EOMI, normal lids, iris ENT:  grossly normal hearing, lips & tongue, mmm Neck:  no LAD, masses or thyromegaly Cardiovascular:  RRR, 4/6 systolic murmur, no r/g. No LE  edema.  Respiratory:  CTA bilaterally, no w/r/r. Normal respiratory effort. Abdomen:  soft, ntnd, NABS Skin:  Diffuse erythema/edema/warmth of the left lower leg extending from toes to  just distal to the knee. Musculoskeletal:  grossly normal tone BUE/BLE, good ROM, no bony abnormality Psychiatric:  grossly normal mood and affect, speech fluent and appropriate, AOx3 Neurologic:  CN 2-12 grossly intact, moves all extremities in coordinated fashion, sensation intact  Labs on Admission: I have personally reviewed following labs and imaging studies  CBC:  Recent Labs Lab 10/04/15 1512  WBC 5.0  NEUTROABS 4.1  HGB 9.5*  HCT 29.9*  MCV 89.8  PLT 25*   Basic Metabolic Panel:  Recent Labs Lab 10/04/15 1512  NA 135  K 3.9  CL 104  CO2 19*  GLUCOSE 139*  BUN 36*  CREATININE 1.84*  CALCIUM 8.9   GFR: Estimated Creatinine Clearance: 27 mL/min (by C-G formula based on SCr of 1.84 mg/dL (H)). Liver Function Tests:  Recent Labs Lab 10/04/15 1512  AST 67*  ALT 54  ALKPHOS 112  BILITOT 1.8*  PROT 6.0*  ALBUMIN 3.6   No results for input(s): LIPASE, AMYLASE in the last 168 hours. No results for input(s): AMMONIA in the last 168 hours. Coagulation Profile: No results for input(s): INR, PROTIME in the last 168 hours. Cardiac Enzymes: No results for input(s): CKTOTAL, CKMB, CKMBINDEX, TROPONINI in the last 168 hours. BNP (last 3 results)  Recent Labs  05/09/15 1157  PROBNP 410.0*   HbA1C: No results for input(s): HGBA1C in the last 72 hours. CBG:  Recent Labs Lab 10/04/15 1913  GLUCAP 168*   Lipid Profile: No results for input(s): CHOL, HDL, LDLCALC, TRIG, CHOLHDL, LDLDIRECT in the last 72 hours. Thyroid Function Tests: No results for input(s): TSH, T4TOTAL, FREET4, T3FREE, THYROIDAB in the last 72 hours. Anemia Panel: No results for input(s): VITAMINB12, FOLATE, FERRITIN, TIBC, IRON, RETICCTPCT in the last 72 hours. Urine analysis:    Component Value Date/Time   COLORURINE YELLOW 10/04/2015 1915   APPEARANCEUR CLEAR 10/04/2015 1915   LABSPEC 1.011 10/04/2015 1915   PHURINE 5.0 10/04/2015 1915   GLUCOSEU NEGATIVE 10/04/2015 1915   HGBUR  NEGATIVE 10/04/2015 1915   BILIRUBINUR NEGATIVE 10/04/2015 1915   KETONESUR NEGATIVE 10/04/2015 1915   PROTEINUR NEGATIVE 10/04/2015 1915   UROBILINOGEN 0.2 05/13/2014 2314   NITRITE NEGATIVE 10/04/2015 1915   LEUKOCYTESUR NEGATIVE 10/04/2015 1915    Creatinine Clearance: Estimated Creatinine Clearance: 27 mL/min (by C-G formula based on SCr of 1.84 mg/dL (H)).  Sepsis Labs: @LABRCNTIP (procalcitonin:4,lacticidven:4) )No results found for this or any previous visit (from the past 240 hour(s)).   Radiological Exams on Admission: No results found.  EKG: Not done  Assessment/Plan Principal Problem:   Recurrent cellulitis of lower leg Active Problems:   Essential hypertension   HYPERGLYCEMIA, FASTING   Hypothyroidism   CKD (chronic kidney disease), stage III   S/P TAVR (transcatheter aortic valve replacement)   Lymphoproliferative disorder (HCC)   Anemia due to bone marrow failure (HCC)   Lactic acidosis   Cellulitis -Patient with multiple prior admissions for cellulitis, now with recurrence in same region -Likely needs long-term antibiotics -Suggest ID consultation in AM, previously seen by Dr. Johnnye Sima (who happens to also be on service now) -For now will treat with Vancomycin as per pharmacy -Elevated lactate may be related to sepsis but has been chronically elevated in the past, likely related to chronic liver disease as per Dr. Benay Spice (09/20/15); will trend for now  Lymphoproliferative  disorder with anemia, thrombocytopenia -Followed by heme/onc -Platelets, while quite low, are generally stable -Anemia is better than prior -No current indication for hematology consult as inpatient  Hypothyroidism -Normal TSH in 5/17 -Continue Synthroid at current dose for now  Hyperglycemia -Prior recent A1c was 5.4 -Will not follow/treat at this time as long-term effects of diabetes are unlikely to be a factor in this patient -If marked elevation in glucose, may require  treatment for improved wound healing  CKD -Appears stable, will follow   DVT prophylaxis:  SCD - R leg only Code Status: DNR - confirmed with patient/family Family Communication: Family present throughout evaluation Disposition Plan:  Home once clinically improved Consults called: Suggest ID in AM  Admission status: Admit to Med Surg   Karmen Bongo MD [facility]  If 7PM-7AM, please contact night-coverage [email or web address] Password [email or web address]  10/05/2015, 1:33 AM

## 2015-10-04 NOTE — ED Provider Notes (Signed)
CSN: [id number]     Arrival date & time 10/04/15  1141 History   First MD Initiated Contact with Patient 10/04/15 1323     Chief Complaint  Patient presents with  . Cellulitis   (Consider location/radiation/quality/duration/timing/severity/associated sxs/prior Treatment) HPI 82 y/o male with a recent hx of hosp for cellulitis. Pt states he is not feeling very well, and feels his symptoms are getting worse. States he was advised to come to UC. Pt is not clear what the treatment plan is.  Past Medical History:  Diagnosis Date  . Action tremor   . Anxiety   . Aortic stenosis    a. Previously severe -> s/p minimally invasive tissue AVR with Dr. Evelina Dun at [facility] 01/2011 (pre-AVR cath with no obs CAD);  b. 05/2014 s/p TAVR (23 mm Edwards Sapien 3).  . Bacteremia    a. 12/2012 - S bovis;  b. 12/2012 TEE w/o veg;  c. Seeing ID->Rocephin therapy extended to 01/25/2013 via PICC for possible endocarditis (No veg on TEE).  . Bradycardia   . Cellulitis 09/09/2015   LEFT LEG  . Cellulitis of left leg 10/11-16/2012  . Chronic diastolic CHF (congestive heart failure) ([place])    a. 12/15/2012 TEE: EF 60-65%, no veg.  . CKD (chronic kidney disease), stage III   . Colon polyp   . Degenerative disc disease   . Diabetes mellitus    "borderline" (01/05/2013)  . Diverticulosis   . GERD (gastroesophageal reflux disease)   . Gout   . H/O hiatal hernia   . High cholesterol   . History of blood transfusion 01/2011; 11/2012  . Hypertension   . Hypotension   . Hypothyroidism   . Joint effusion, knee    left knee  . Leukopenia    Chronic pancytopenia  . PAF (paroxysmal atrial fibrillation) (HCC)    a. amiodarone therapy; not felt to be a candidate for anticoagulation with pancytopenia. b. Back in AF 09/2014 - decision was made to pursue rate control only.  . Pancytopenia ([place])    a. possible chronic lymphoproliferative disorder or splenic lymphoma.  . QT prolongation   . S/P TAVR (transcatheter aortic valve  replacement)    a. 05/14/2014 TAVR: 23 mm Edwards Sapien 3 transcatheter heart valve placed valve-in-valve for prosthetic valve dysfunction via open right transfemoral approach  . Splenomegaly   . Stroke ([place])   . Synovial cyst of popliteal space   . Throat cancer ([place])    s/p lasered  . Thrombocytopenia (HCC)    Dr. Benay Spice   Past Surgical History:  Procedure Laterality Date  . AORTIC VALVE REPLACEMENT  01/23/2011   via minimally invasive approach per Dr Evelina Dun, [facility]  . CARDIAC CATHETERIZATION    . CARDIAC VALVE REPLACEMENT    . CHOLECYSTECTOMY    . EXCISIONAL HEMORRHOIDECTOMY    . INGUINAL HERNIA REPAIR Right   . joint effusion     left knee  . LESION REMOVAL Right 08/02/2015   Procedure: EXCISION  RIGHT EAR SKIN CANCER;  Surgeon: Rozetta Nunnery, MD;  Location: [place];  Service: ENT;  Laterality: Right;  LOCAL  . 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 SURGERY SCROTAL / TESTICULAR     "removed one" (01/05/2013)  . TEE WITHOUT CARDIOVERSION N/A 12/15/2012   Procedure: TRANSESOPHAGEAL ECHOCARDIOGRAM (TEE);  Surgeon: Houston Siren  Hazel Sams, MD;  Location: [facility];  Service: Cardiovascular;  Laterality: N/A;  . TEE WITHOUT CARDIOVERSION N/A 03/26/2014   Procedure: TRANSESOPHAGEAL ECHOCARDIOGRAM (TEE);  Surgeon: Josue Hector, MD;  Location: [place];  Service: Cardiovascular;  Laterality: N/A;  . TEE WITHOUT CARDIOVERSION N/A 05/14/2014   Procedure: TRANSESOPHAGEAL ECHOCARDIOGRAM (TEE);  Surgeon: [name], MD;  Location: [place];  Service: Open Heart Surgery;  Laterality: N/A;  . TRANSCATHETER AORTIC VALVE REPLACEMENT, TRANSFEMORAL Right 05/14/2014   Procedure: TRANSCATHETER AORTIC VALVE REPLACEMENT, TRANSFEMORAL;  Surgeon: [name], MD;  Location: [place];  Service: Open Heart Surgery;  Laterality: Right;    Family History  Problem Relation Age of Onset  . Lung cancer Mother   . Heart disease Father   . Colon cancer Other   . Stroke Other   . Cancer Brother    Social History  Substance Use Topics  . Smoking status: Former Smoker    Packs/day: 0.75    Years: 24.00    Types: Cigarettes    Quit date: 01/11/1962  . Smokeless tobacco: Never Used  . Alcohol use No    Review of Systems  Denies: HEADACHE, NAUSEA, ABDOMINAL PAIN, CHEST PAIN, CONGESTION, DYSURIA, SHORTNESS OF BREATH  Allergies  Penicillins and Neomycin-bacitracin zn-polymyx  Home Medications   Prior to Admission medications   Medication Sig Start Date End Date Taking? Authorizing Provider  allopurinol (ZYLOPRIM) 100 MG tablet Take 100 mg by mouth daily.    Historical Provider, MD  amiodarone (PACERONE) 200 MG tablet Take 1 tablet by mouth daily 03/23/15   [name], MD  Cholecalciferol (VITAMIN D) 1000 UNITS capsule Take 1 capsule (1,000 Units total) by mouth daily. 01/05/11   Renella Cunas, MD  clonazePAM (KLONOPIN) 0.5 MG tablet Take 0.25 mg by mouth daily as needed (Take 1/2 tablet once daily as needed if you wake up early).    Historical Provider, MD  colchicine 0.6 MG tablet Take 2 tabs once and then 1 tab one hour later for gout flare 09/27/15   Binnie Rail, MD  ferrous sulfate 325 (65 FE) MG tablet Take 325 mg by mouth 2 (two) times daily with a meal.    Historical Provider, MD  fish oil-omega-3 fatty acids 1000 MG capsule Take 1 g by mouth daily.      Historical Provider, MD  Flaxseed, Linseed, (FLAX SEED OIL) 1000 MG CAPS Take 1 capsule by mouth daily.     Historical Provider, MD  furosemide (LASIX) 80 MG tablet Take 80mg  in the morning and 40mg  in the afternoon 06/10/15   [name], MD  levothyroxine (SYNTHROID, LEVOTHROID) 25 MCG tablet Take 25 mcg by mouth daily before breakfast.    Historical Provider, MD  metolazone (ZAROXOLYN) 2.5 MG tablet Take 1 tablet (2.5 mg total) by mouth daily as needed  (edema). Patient not taking: Reported on 10/03/2015 12/22/14   [name], MD  Multiple Vitamin (MULTIVITAMIN) tablet Take 1 tablet by mouth daily.      Historical Provider, MD  omeprazole (PRILOSEC) 20 MG capsule TAKE 1 CAPSULE EVERY DAY  (REPLACES  PANTOPRAZOLE) 06/29/15   [name], MD  potassium chloride (K-DUR) 10 MEQ tablet Take 1 tablet (10 mEq total) by mouth daily. 02/04/14   [name], MD  propranolol (INDERAL) 10 MG tablet Take 1 tablet (10 mg total) by mouth 2 (two) times daily. 08/19/15   Cherene Altes, MD  sertraline (ZOLOFT) 50 MG tablet TAKE 1 TABLET (50 MG TOTAL) BY MOUTH AT  BEDTIME. 03/29/15   Binnie Rail, MD  traMADol (ULTRAM) 50 MG tablet Take 50 mg by mouth every 8 (eight) hours as needed (pain). Reported on 06/28/2015    Historical Provider, MD  vitamin C (ASCORBIC ACID) 500 MG tablet Take 1 tablet (500 mg total) by mouth daily. 01/05/11   Renella Cunas, MD   Meds Ordered and Administered this Visit  Medications - No data to display  BP 110/60 (BP Location: Left Arm)   Pulse 89   Temp 99.3 F (37.4 C) (Oral)   Resp 12   SpO2 100%  No data found.   Physical Exam NURSES NOTES AND VITAL SIGNS REVIEWED. CONSTITUTIONAL: Well developed, well nourished, no acute distress, feels ill.  HEENT: normocephalic, atraumatic EYES: Conjunctiva normal NECK:normal ROM, supple, no adenopathy PULMONARY:No respiratory distress, normal effort ABDOMINAL: Soft, ND, NT BS+, No CVAT MUSCULOSKELETAL: Normal ROM of all extremities, Left leg has redness and heat extending to the knee. The leg is very warm to touch.  SKIN: warm and dry without rash PSYCHIATRIC: Mood and affect, behavior are normal  Urgent Care Course   Clinical Course  Unable to discuss with PCP the treatment course or plan for this patient. It is in his best interest to go the ER.  Procedures (including critical care time)  Labs Review Labs Reviewed - No data to display  Imaging Review No results  found.   Visual Acuity Review  Right Eye Distance:   Left Eye Distance:   Bilateral Distance:    Right Eye Near:   Left Eye Near:    Bilateral Near:         MDM   1. Cellulitis of left lower extremity    Pt should attend the emergency department for further review and treatment    Konrad Felix, [place] 10/04/15 1411

## 2015-10-04 NOTE — Telephone Encounter (Signed)
LVM informing [name], spoke with pt, pt is going to go to [facility].

## 2015-10-04 NOTE — ED Triage Notes (Signed)
The patient presented to the [facility] with his daughter with a complaint of left lower leg pain. The patient reported that he was hospitalized on 09/09/2015 for cellulitis on the same leg. The patient reported being hospitalized for 15 days. The patient stated that the same leg is now again red and swollen and warm to the touch. The patient was advised by his PCP to come to the [facility].

## 2015-10-05 ENCOUNTER — Telehealth: Payer: Self-pay | Admitting: Nurse Practitioner

## 2015-10-05 DIAGNOSIS — Z66 Do not resuscitate: Secondary | ICD-10-CM | POA: Diagnosis present

## 2015-10-05 DIAGNOSIS — B964 Proteus (mirabilis) (morganii) as the cause of diseases classified elsewhere: Secondary | ICD-10-CM | POA: Diagnosis present

## 2015-10-05 DIAGNOSIS — R7309 Other abnormal glucose: Secondary | ICD-10-CM | POA: Diagnosis not present

## 2015-10-05 DIAGNOSIS — L03119 Cellulitis of unspecified part of limb: Secondary | ICD-10-CM

## 2015-10-05 DIAGNOSIS — I10 Essential (primary) hypertension: Secondary | ICD-10-CM | POA: Diagnosis not present

## 2015-10-05 DIAGNOSIS — N390 Urinary tract infection, site not specified: Secondary | ICD-10-CM | POA: Diagnosis present

## 2015-10-05 DIAGNOSIS — C946 Myelodysplastic disease, not classified: Secondary | ICD-10-CM | POA: Diagnosis present

## 2015-10-05 DIAGNOSIS — E1122 Type 2 diabetes mellitus with diabetic chronic kidney disease: Secondary | ICD-10-CM | POA: Diagnosis present

## 2015-10-05 DIAGNOSIS — I480 Paroxysmal atrial fibrillation: Secondary | ICD-10-CM | POA: Diagnosis present

## 2015-10-05 DIAGNOSIS — Z79899 Other long term (current) drug therapy: Secondary | ICD-10-CM | POA: Diagnosis not present

## 2015-10-05 DIAGNOSIS — E1165 Type 2 diabetes mellitus with hyperglycemia: Secondary | ICD-10-CM | POA: Diagnosis present

## 2015-10-05 DIAGNOSIS — D600 Chronic acquired pure red cell aplasia: Secondary | ICD-10-CM | POA: Diagnosis not present

## 2015-10-05 DIAGNOSIS — N183 Chronic kidney disease, stage 3 (moderate): Secondary | ICD-10-CM | POA: Diagnosis not present

## 2015-10-05 DIAGNOSIS — I130 Hypertensive heart and chronic kidney disease with heart failure and stage 1 through stage 4 chronic kidney disease, or unspecified chronic kidney disease: Secondary | ICD-10-CM | POA: Diagnosis present

## 2015-10-05 DIAGNOSIS — E872 Acidosis: Secondary | ICD-10-CM | POA: Diagnosis present

## 2015-10-05 DIAGNOSIS — Z85819 Personal history of malignant neoplasm of unspecified site of lip, oral cavity, and pharynx: Secondary | ICD-10-CM | POA: Diagnosis not present

## 2015-10-05 DIAGNOSIS — Z888 Allergy status to other drugs, medicaments and biological substances status: Secondary | ICD-10-CM | POA: Diagnosis not present

## 2015-10-05 DIAGNOSIS — B9689 Other specified bacterial agents as the cause of diseases classified elsewhere: Secondary | ICD-10-CM | POA: Diagnosis present

## 2015-10-05 DIAGNOSIS — Z86010 Personal history of colonic polyps: Secondary | ICD-10-CM | POA: Diagnosis not present

## 2015-10-05 DIAGNOSIS — L03116 Cellulitis of left lower limb: Principal | ICD-10-CM

## 2015-10-05 DIAGNOSIS — N184 Chronic kidney disease, stage 4 (severe): Secondary | ICD-10-CM | POA: Diagnosis present

## 2015-10-05 DIAGNOSIS — L0390 Cellulitis, unspecified: Secondary | ICD-10-CM | POA: Diagnosis present

## 2015-10-05 DIAGNOSIS — Z87891 Personal history of nicotine dependence: Secondary | ICD-10-CM | POA: Diagnosis not present

## 2015-10-05 DIAGNOSIS — Z880 Allergy status to penicillin: Secondary | ICD-10-CM | POA: Diagnosis not present

## 2015-10-05 DIAGNOSIS — D61818 Other pancytopenia: Secondary | ICD-10-CM | POA: Diagnosis present

## 2015-10-05 DIAGNOSIS — K219 Gastro-esophageal reflux disease without esophagitis: Secondary | ICD-10-CM | POA: Diagnosis present

## 2015-10-05 DIAGNOSIS — D479 Neoplasm of uncertain behavior of lymphoid, hematopoietic and related tissue, unspecified: Secondary | ICD-10-CM | POA: Diagnosis present

## 2015-10-05 DIAGNOSIS — I5032 Chronic diastolic (congestive) heart failure: Secondary | ICD-10-CM | POA: Diagnosis present

## 2015-10-05 DIAGNOSIS — Z952 Presence of prosthetic heart valve: Secondary | ICD-10-CM | POA: Diagnosis not present

## 2015-10-05 DIAGNOSIS — E038 Other specified hypothyroidism: Secondary | ICD-10-CM | POA: Diagnosis not present

## 2015-10-05 DIAGNOSIS — E039 Hypothyroidism, unspecified: Secondary | ICD-10-CM | POA: Diagnosis present

## 2015-10-05 LAB — CBC
HCT: 22.7 % — ABNORMAL LOW (ref 39.0–52.0)
Hemoglobin: 7.2 g/dL — ABNORMAL LOW (ref 13.0–17.0)
MCH: 28.3 pg (ref 26.0–34.0)
MCHC: 31.7 g/dL (ref 30.0–36.0)
MCV: 89.4 fL (ref 78.0–100.0)
PLATELETS: 21 10*3/uL — AB (ref 150–400)
RBC: 2.54 MIL/uL — ABNORMAL LOW (ref 4.22–5.81)
RDW: 17.7 % — AB (ref 11.5–15.5)
WBC: 2.1 10*3/uL — AB (ref 4.0–10.5)

## 2015-10-05 LAB — C DIFFICILE QUICK SCREEN W PCR REFLEX
C DIFFICLE (CDIFF) ANTIGEN: NEGATIVE
C Diff interpretation: NOT DETECTED
C Diff toxin: NEGATIVE

## 2015-10-05 LAB — LACTIC ACID, PLASMA
LACTIC ACID, VENOUS: 1 mmol/L (ref 0.5–1.9)
LACTIC ACID, VENOUS: 1.1 mmol/L (ref 0.5–1.9)

## 2015-10-05 LAB — BASIC METABOLIC PANEL
Anion gap: 10 (ref 5–15)
BUN: 33 mg/dL — AB (ref 6–20)
CALCIUM: 8.3 mg/dL — AB (ref 8.9–10.3)
CO2: 21 mmol/L — ABNORMAL LOW (ref 22–32)
CREATININE: 1.77 mg/dL — AB (ref 0.61–1.24)
Chloride: 106 mmol/L (ref 101–111)
GFR calc Af Amer: 37 mL/min — ABNORMAL LOW (ref >60)
GFR, EST NON AFRICAN AMERICAN: 32 mL/min — AB (ref >60)
GLUCOSE: 129 mg/dL — AB (ref 65–99)
POTASSIUM: 3.5 mmol/L (ref 3.5–5.1)
SODIUM: 137 mmol/L (ref 135–145)

## 2015-10-05 LAB — GLUCOSE, CAPILLARY: Glucose-Capillary: 141 mg/dL — ABNORMAL HIGH (ref 65–99)

## 2015-10-05 MED ORDER — METOLAZONE 2.5 MG PO TABS
2.5000 mg | ORAL_TABLET | Freq: Every day | ORAL | Status: DC
  Administered 2015-10-05 – 2015-10-09 (×5): 2.5 mg via ORAL
  Filled 2015-10-05 (×6): qty 1

## 2015-10-05 MED ORDER — POTASSIUM CHLORIDE CRYS ER 20 MEQ PO TBCR
40.0000 meq | EXTENDED_RELEASE_TABLET | Freq: Once | ORAL | Status: AC
  Administered 2015-10-05: 40 meq via ORAL
  Filled 2015-10-05: qty 2

## 2015-10-05 MED ORDER — FUROSEMIDE 10 MG/ML IJ SOLN
80.0000 mg | Freq: Two times a day (BID) | INTRAMUSCULAR | Status: DC
  Administered 2015-10-05 – 2015-10-08 (×6): 80 mg via INTRAVENOUS
  Filled 2015-10-05 (×7): qty 8

## 2015-10-05 NOTE — Consult Note (Signed)
   [facility] Inpatient Consult   10/05/2015  ELIJUAH YECK 02-24-1922 [id number]   Mr. Bradway is active with [place] [facility] program. Please see chart review then notes for further patient outreach details. [facility] RNCM has been in frequent contact with Mr. Aloisi and has been contacting Primary Care MD on patient's behalf.  Spoke with Mr. Hecksel and daughter at bedside. Discussed readmission. Mr. Koen indicates he had increased swelling and pain and warmth to his left lower leg. He expresses frustration about having a recurrent cellulitis. His daughter also indicates that there was some confusion about his lasix as well. She states Mr. Luskey is compliant with weighing himself and taking his medications as instructed.   Spoke with inpatient RNCM to collaborate about Mr.Rego's readmission. He is active with [facility] home health as well. Discussed whether he would qualify for [facility] program with home health. Also discussed the confusion regarding his lasix per his daughter. Mr.Flagg lives alone but has good family support. He is usually pretty independent. However, with recent long length of stay hospitalization and recurrent cellulitis, he reports being a little worn out. Left contact information at bedside with daughter. Will update [facility] RNCM as well.   Marthenia Rolling, MSN-Ed, RN,BSN [facility] Liaison [phone]

## 2015-10-05 NOTE — Progress Notes (Signed)
PROGRESS NOTE  [name]  [id number] DOB: [birth year] DOA: 10/04/2015 PCP: Binnie Rail, MD Outpatient Specialists:  Subjective: Denies any fever or chills overnight. Still complaining about redness, swelling and tenderness over the left lower extremity  Brief Narrative:  [name] is a 82 y.o. male with medical history significant of AS s/p TAVR in 2016, CHF, lymphoproliferative disorder with severe pancytopenia, PAF, and multiple recent hospitalizations for cellulitis presenting with recurrent cellulitis.  Hospitalized in May for UTI (unrelated).  Hospitalized from 8/2-9/17 and again from 9/1-15/17 both with cellulitis in the left lower leg.  In between hospitalizations he was also treated with doxycycline for recurrence.  Went home on 9/15 and has had home health care since then.  It was doing fine yesterday according to the nurse.  This AM, there was little issue other than waking up with a headache and thirst.  Went to get newspaper and came back and noticed chills.  Through the day, he noticed warmth, progressive redness.  Able to eat and drink.  No further antibiotics since discharge.  Went to Urgent Care and was sent to the ER.    Assessment & Plan:   Principal Problem:   Recurrent cellulitis of lower leg Active Problems:   Essential hypertension   HYPERGLYCEMIA, FASTING   Hypothyroidism   CKD (chronic kidney disease), stage III   S/P TAVR (transcatheter aortic valve replacement)   Lymphoproliferative disorder (HCC)   Anemia due to bone marrow failure (HCC)   Lactic acidosis   Cellulitis, left lower extremity -Patient with multiple prior admissions for cellulitis, now with recurrence in same region -Patient is well-known to me, I have seen him on previous admission. -At low-grade fever of 99.3, leukocytes not very reliable way to tell about infection as he does have myeloproliferative disorder. -I will hold IV antibiotics and monitor in the hospital and restart if  he developed fever chills or worsening of his swelling. -I will treat with the IV diuretics, elevate leg, check pro-calcitonin.  Lymphoproliferative disorder with anemia, thrombocytopenia -Followed by heme/onc -Platelets, while quite low, are generally stable -Anemia is better than prior -No current indication for hematology consult as inpatient  Hypothyroidism -Normal TSH in 5/17 -Continue Synthroid at current dose for now  Hyperglycemia -Prior recent A1c was 5.4 -Will not follow/treat at this time as long-term effects of diabetes are unlikely to be a factor in this patient -If marked elevation in glucose, may require treatment for improved wound healing  CKD -Appears stable, will follow   DVT prophylaxis: SCD Code Status: DNR Family Communication:  Disposition Plan:  Diet: Diet regular Room service appropriate? Yes; Fluid consistency: Thin  Consultants:   None  Procedures:   None  Antimicrobials:   Vancomycin  Objective: Vitals:   10/04/15 2200 10/04/15 2253 10/05/15 0601 10/05/15 0900  BP: 94/61 122/75 (!) 90/57 115/70  Pulse: 88 82 79 100  Resp: 16 16 16 17   Temp: 98.6 F (37 C) 98.8 F (37.1 C) 97.9 F (36.6 C) 97.9 F (36.6 C)  TempSrc: Oral Oral Oral Oral  SpO2: 100% 100% 100% 100%  Weight:  81.1 kg (178 lb 12.7 oz)    Height:  5\' 10"  (1.778 m)      Intake/Output Summary (Last 24 hours) at 10/05/15 1219 Last data filed at 10/05/15 0930  Gross per 24 hour  Intake              360 ml  Output  150 ml  Net              210 ml   Filed Weights   10/04/15 1459 10/04/15 1825 10/04/15 2253  Weight: 79.4 kg (175 lb) 78.9 kg (174 lb) 81.1 kg (178 lb 12.7 oz)    Examination: General exam: Appears calm and comfortable  Respiratory system: Clear to auscultation. Respiratory effort normal. Cardiovascular system: S1 & S2 heard, RRR. No JVD, murmurs, rubs, gallops or clicks. No pedal edema. Gastrointestinal system: Abdomen is  nondistended, soft and nontender. No organomegaly or masses felt. Normal bowel sounds heard. Central nervous system: Alert and oriented. No focal neurological deficits. Extremities: Symmetric 5 x 5 power. Skin: No rashes, lesions or ulcers Psychiatry: Judgement and insight appear normal. Mood & affect appropriate.   Data Reviewed: I have personally reviewed following labs and imaging studies  CBC:  Recent Labs Lab 10/04/15 1512 10/05/15 0531  WBC 5.0 2.1*  NEUTROABS 4.1  --   HGB 9.5* 7.2*  HCT 29.9* 22.7*  MCV 89.8 89.4  PLT 25* 21*   Basic Metabolic Panel:  Recent Labs Lab 10/04/15 1512 10/05/15 0531  NA 135 137  K 3.9 3.5  CL 104 106  CO2 19* 21*  GLUCOSE 139* 129*  BUN 36* 33*  CREATININE 1.84* 1.77*  CALCIUM 8.9 8.3*   GFR: Estimated Creatinine Clearance: 28.1 mL/min (by C-G formula based on SCr of 1.77 mg/dL (H)). Liver Function Tests:  Recent Labs Lab 10/04/15 1512  AST 67*  ALT 54  ALKPHOS 112  BILITOT 1.8*  PROT 6.0*  ALBUMIN 3.6   No results for input(s): LIPASE, AMYLASE in the last 168 hours. No results for input(s): AMMONIA in the last 168 hours. Coagulation Profile: No results for input(s): INR, PROTIME in the last 168 hours. Cardiac Enzymes: No results for input(s): CKTOTAL, CKMB, CKMBINDEX, TROPONINI in the last 168 hours. BNP (last 3 results)  Recent Labs  05/09/15 1157  PROBNP 410.0*   HbA1C: No results for input(s): HGBA1C in the last 72 hours. CBG:  Recent Labs Lab 10/04/15 1913 10/04/15 2253  GLUCAP 168* 141*   Lipid Profile: No results for input(s): CHOL, HDL, LDLCALC, TRIG, CHOLHDL, LDLDIRECT in the last 72 hours. Thyroid Function Tests: No results for input(s): TSH, T4TOTAL, FREET4, T3FREE, THYROIDAB in the last 72 hours. Anemia Panel: No results for input(s): VITAMINB12, FOLATE, FERRITIN, TIBC, IRON, RETICCTPCT in the last 72 hours. Urine analysis:    Component Value Date/Time   COLORURINE YELLOW 10/04/2015 1915    APPEARANCEUR CLEAR 10/04/2015 1915   LABSPEC 1.011 10/04/2015 1915   PHURINE 5.0 10/04/2015 1915   GLUCOSEU NEGATIVE 10/04/2015 1915   HGBUR NEGATIVE 10/04/2015 1915   BILIRUBINUR NEGATIVE 10/04/2015 1915   KETONESUR NEGATIVE 10/04/2015 1915   PROTEINUR NEGATIVE 10/04/2015 1915   UROBILINOGEN 0.2 05/13/2014 2314   NITRITE NEGATIVE 10/04/2015 1915   LEUKOCYTESUR NEGATIVE 10/04/2015 1915   Sepsis Labs: @LABRCNTIP (procalcitonin:4,lacticidven:4)  )No results found for this or any previous visit (from the past 240 hour(s)).   Invalid input(s): PROCALCITONIN, [place]   Radiology Studies: No results found.      Scheduled Meds: . allopurinol  100 mg Oral Daily  . amiodarone  200 mg Oral Daily  . docusate sodium  100 mg Oral BID  . ferrous sulfate  325 mg Oral BID WC  . levothyroxine  25 mcg Oral QAC breakfast  . propranolol  10 mg Oral BID  . sertraline  50 mg Oral QHS  . vancomycin  500  mg Intravenous Q24H   Continuous Infusions:    LOS: 0 days    Time spent: 35 minutes    Bradee Common A, MD [facility] Pager [phone]  If 7PM-7AM, please contact night-coverage [email or web address] Password [facility] 10/05/2015, 12:19 PM

## 2015-10-05 NOTE — Telephone Encounter (Signed)
09/29 Appointments canceled per patient's daughter request. Patient is hospitalized. Will call to reschedule.

## 2015-10-05 NOTE — Telephone Encounter (Signed)
error 

## 2015-10-06 DIAGNOSIS — E872 Acidosis: Secondary | ICD-10-CM

## 2015-10-06 LAB — BASIC METABOLIC PANEL
ANION GAP: 8 (ref 5–15)
BUN: 40 mg/dL — ABNORMAL HIGH (ref 6–20)
CALCIUM: 8.7 mg/dL — AB (ref 8.9–10.3)
CHLORIDE: 107 mmol/L (ref 101–111)
CO2: 21 mmol/L — AB (ref 22–32)
Creatinine, Ser: 1.92 mg/dL — ABNORMAL HIGH (ref 0.61–1.24)
GFR calc non Af Amer: 29 mL/min — ABNORMAL LOW (ref >60)
GFR, EST AFRICAN AMERICAN: 33 mL/min — AB (ref >60)
GLUCOSE: 155 mg/dL — AB (ref 65–99)
POTASSIUM: 3.7 mmol/L (ref 3.5–5.1)
Sodium: 136 mmol/L (ref 135–145)

## 2015-10-06 LAB — CBC
HCT: 24.1 % — ABNORMAL LOW (ref 39.0–52.0)
HEMOGLOBIN: 7.7 g/dL — AB (ref 13.0–17.0)
MCH: 28.5 pg (ref 26.0–34.0)
MCHC: 32 g/dL (ref 30.0–36.0)
MCV: 89.3 fL (ref 78.0–100.0)
PLATELETS: 25 10*3/uL — AB (ref 150–400)
RBC: 2.7 MIL/uL — AB (ref 4.22–5.81)
RDW: 17.7 % — AB (ref 11.5–15.5)
WBC: 1.6 10*3/uL — ABNORMAL LOW (ref 4.0–10.5)

## 2015-10-06 MED ORDER — POTASSIUM CHLORIDE CRYS ER 20 MEQ PO TBCR
40.0000 meq | EXTENDED_RELEASE_TABLET | Freq: Every day | ORAL | Status: DC
  Administered 2015-10-06 – 2015-10-09 (×4): 40 meq via ORAL
  Filled 2015-10-06 (×4): qty 2

## 2015-10-06 NOTE — Progress Notes (Signed)
PROGRESS NOTE  CHICO TARANTO  [id number] DOB: [birth year] DOA: 10/04/2015 PCP: Binnie Rail, MD Outpatient Specialists:  Subjective: Seen with his daughter at bedside, still has lower extremity edema Redness is improving, he is off of antibiotics since yesterday.  Brief Narrative:  [name] is a 82 y.o. male with medical history significant of AS s/p TAVR in 2016, CHF, lymphoproliferative disorder with severe pancytopenia, PAF, and multiple recent hospitalizations for cellulitis presenting with recurrent cellulitis.  Hospitalized in May for UTI (unrelated).  Hospitalized from 8/2-9/17 and again from 9/1-15/17 both with cellulitis in the left lower leg.  In between hospitalizations he was also treated with doxycycline for recurrence.  Went home on 9/15 and has had home health care since then.  It was doing fine yesterday according to the nurse.  This AM, there was little issue other than waking up with a headache and thirst.  Went to get newspaper and came back and noticed chills.  Through the day, he noticed warmth, progressive redness.  Able to eat and drink.  No further antibiotics since discharge.  Went to Urgent Care and was sent to the ER.    Assessment & Plan:   Principal Problem:   Recurrent cellulitis of lower leg Active Problems:   Essential hypertension   HYPERGLYCEMIA, FASTING   Hypothyroidism   CKD (chronic kidney disease), stage III   S/P TAVR (transcatheter aortic valve replacement)   Lymphoproliferative disorder (HCC)   Anemia due to bone marrow failure (HCC)   Lactic acidosis   Cellulitis   ? Cellulitis, left lower extremity -Patient with multiple prior admissions for cellulitis, now with recurrence in same region -Patient is well-known to me, I have seen him on previous admission. -At low-grade fever of 99.3, leukocytes not very reliable way to tell about infection as he does have myeloproliferative disorder. -I will hold IV antibiotics and monitor in the  hospital and restart if he developed fever chills or worsening of his swelling. -I will treat with the IV diuretics, elevate leg, check pro-calcitonin. Continue current plans, had 1.4 L output. -If develops acute on chronic renal failure I might need nephrology to come and evaluate him.  Lymphoproliferative disorder with pancytopenia -Followed by heme/onc -Leukopenia, anemia and thrombocytopenia, follow his hemoglobin closely in case he needs transfusion. -Hemoglobin is 7.7 today.  Hypothyroidism -Normal TSH in 5/17 -Continue Synthroid at current dose for now  Hyperglycemia -Prior recent A1c was 5.4 -Will not follow/treat at this time as long-term effects of diabetes are unlikely to be a factor in this patient -If marked elevation in glucose, may require treatment for improved wound healing  CKD stage 3-4 -Creatinine is 1.8 on admission, currently is 1.9 around baseline. GU aggressive diuresis.   DVT prophylaxis: SCD Code Status: DNR Family Communication:  Disposition Plan:  Diet: Diet regular Room service appropriate? Yes; Fluid consistency: Thin  Consultants:   None  Procedures:   None  Antimicrobials:   Vancomycin  Objective: Vitals:   10/05/15 2013 10/05/15 2108 10/06/15 0546 10/06/15 0910  BP: (!) 147/64  (!) 93/52 (!) 117/54  Pulse: 93  85 88  Resp: 17  16 17   Temp: 98.5 F (36.9 C)  98.2 F (36.8 C) 97.6 F (36.4 C)  TempSrc:    Oral  SpO2: 98%  100% 100%  Weight:  79.8 kg (176 lb)    Height:        Intake/Output Summary (Last 24 hours) at 10/06/15 1124 Last data filed at 10/06/15 [phone]  Gross per 24 hour  Intake              720 ml  Output             1775 ml  Net            -1055 ml   Filed Weights   10/04/15 1825 10/04/15 2253 10/05/15 2108  Weight: 78.9 kg (174 lb) 81.1 kg (178 lb 12.7 oz) 79.8 kg (176 lb)    Examination: General exam: Appears calm and comfortable  Respiratory system: Clear to auscultation. Respiratory effort  normal. Cardiovascular system: S1 & S2 heard, RRR. No JVD, murmurs, rubs, gallops or clicks. No pedal edema. Gastrointestinal system: Abdomen is nondistended, soft and nontender. No organomegaly or masses felt. Normal bowel sounds heard. Central nervous system: Alert and oriented. No focal neurological deficits. Extremities: Symmetric 5 x 5 power. Skin: No rashes, lesions or ulcers Psychiatry: Judgement and insight appear normal. Mood & affect appropriate.   Data Reviewed: I have personally reviewed following labs and imaging studies  CBC:  Recent Labs Lab 10/04/15 1512 10/05/15 0531 10/06/15 0742  WBC 5.0 2.1* 1.6*  NEUTROABS 4.1  --   --   HGB 9.5* 7.2* 7.7*  HCT 29.9* 22.7* 24.1*  MCV 89.8 89.4 89.3  PLT 25* 21* 25*   Basic Metabolic Panel:  Recent Labs Lab 10/04/15 1512 10/05/15 0531 10/06/15 0742  NA 135 137 136  K 3.9 3.5 3.7  CL 104 106 107  CO2 19* 21* 21*  GLUCOSE 139* 129* 155*  BUN 36* 33* 40*  CREATININE 1.84* 1.77* 1.92*  CALCIUM 8.9 8.3* 8.7*   GFR: Estimated Creatinine Clearance: 25.9 mL/min (by C-G formula based on SCr of 1.92 mg/dL (H)). Liver Function Tests:  Recent Labs Lab 10/04/15 1512  AST 67*  ALT 54  ALKPHOS 112  BILITOT 1.8*  PROT 6.0*  ALBUMIN 3.6   No results for input(s): LIPASE, AMYLASE in the last 168 hours. No results for input(s): AMMONIA in the last 168 hours. Coagulation Profile: No results for input(s): INR, PROTIME in the last 168 hours. Cardiac Enzymes: No results for input(s): CKTOTAL, CKMB, CKMBINDEX, TROPONINI in the last 168 hours. BNP (last 3 results)  Recent Labs  05/09/15 1157  PROBNP 410.0*   HbA1C: No results for input(s): HGBA1C in the last 72 hours. CBG:  Recent Labs Lab 10/04/15 1913 10/04/15 2253  GLUCAP 168* 141*   Lipid Profile: No results for input(s): CHOL, HDL, LDLCALC, TRIG, CHOLHDL, LDLDIRECT in the last 72 hours. Thyroid Function Tests: No results for input(s): TSH, T4TOTAL,  FREET4, T3FREE, THYROIDAB in the last 72 hours. Anemia Panel: No results for input(s): VITAMINB12, FOLATE, FERRITIN, TIBC, IRON, RETICCTPCT in the last 72 hours. Urine analysis:    Component Value Date/Time   COLORURINE YELLOW 10/04/2015 1915   APPEARANCEUR CLEAR 10/04/2015 1915   LABSPEC 1.011 10/04/2015 1915   PHURINE 5.0 10/04/2015 1915   GLUCOSEU NEGATIVE 10/04/2015 1915   HGBUR NEGATIVE 10/04/2015 1915   BILIRUBINUR NEGATIVE 10/04/2015 1915   KETONESUR NEGATIVE 10/04/2015 1915   PROTEINUR NEGATIVE 10/04/2015 1915   UROBILINOGEN 0.2 05/13/2014 2314   NITRITE NEGATIVE 10/04/2015 1915   LEUKOCYTESUR NEGATIVE 10/04/2015 1915   Sepsis Labs: @LABRCNTIP (procalcitonin:4,lacticidven:4)  ) Recent Results (from the past 240 hour(s))  Culture, blood (Routine x 2)     Status: None (Preliminary result)   Collection Time: 10/04/15  5:43 PM  Result Value Ref Range Status   Specimen Description BLOOD RIGHT ANTECUBITAL  Final  Special Requests BOTTLES DRAWN AEROBIC AND ANAEROBIC 5CC  Final   Culture NO GROWTH < 24 HOURS  Final   Report Status PENDING  Incomplete  Culture, blood (Routine x 2)     Status: None (Preliminary result)   Collection Time: 10/04/15  5:47 PM  Result Value Ref Range Status   Specimen Description BLOOD LEFT HAND  Final   Special Requests BOTTLES DRAWN AEROBIC ONLY 5CC  Final   Culture NO GROWTH < 24 HOURS  Final   Report Status PENDING  Incomplete  Urine culture     Status: Abnormal (Preliminary result)   Collection Time: 10/04/15  7:15 PM  Result Value Ref Range Status   Specimen Description URINE, CLEAN CATCH  Final   Special Requests NONE  Final   Culture (A)  Final    20,000 COLONIES/mL KLEBSIELLA OXYTOCA 10,000 COLONIES/mL PROTEUS MIRABILIS SUSCEPTIBILITIES TO FOLLOW    Report Status PENDING  Incomplete  C difficile quick scan w PCR reflex     Status: None   Collection Time: 10/05/15 10:28 AM  Result Value Ref Range Status   C Diff antigen NEGATIVE  NEGATIVE Final   C Diff toxin NEGATIVE NEGATIVE Final   C Diff interpretation No C. difficile detected.  Final     Invalid input(s): PROCALCITONIN, LACTICACIDVEN   Radiology Studies: No results found.      Scheduled Meds: . allopurinol  100 mg Oral Daily  . amiodarone  200 mg Oral Daily  . docusate sodium  100 mg Oral BID  . ferrous sulfate  325 mg Oral BID WC  . furosemide  80 mg Intravenous BID  . levothyroxine  25 mcg Oral QAC breakfast  . metolazone  2.5 mg Oral Daily  . propranolol  10 mg Oral BID  . sertraline  50 mg Oral QHS   Continuous Infusions:    LOS: 1 day    Time spent: 35 minutes    Norrine Ballester A, MD [facility] Pager [phone]  If 7PM-7AM, please contact night-coverage [email or web address] Password [facility] 10/06/2015, 11:24 AM

## 2015-10-06 NOTE — Care Management Obs Status (Addendum)
MEDICARE OBSERVATION STATUS NOTIFICATION Late Entry  Patient Details  Name: KASHYAP NUTTLE MRN: [id number] Date of Birth: 1922/07/13   Medicare Observation Status Notification Given:  Yes  Notification given to pt and daughter and explained at length. Given on 10/05/2015 @ 5pm.  Adron Bene, RN 10/06/2015, 9:55 AM

## 2015-10-07 ENCOUNTER — Other Ambulatory Visit: Payer: Commercial Managed Care - HMO

## 2015-10-07 ENCOUNTER — Ambulatory Visit: Payer: Commercial Managed Care - HMO | Admitting: Nurse Practitioner

## 2015-10-07 LAB — CBC
HEMATOCRIT: 25.4 % — AB (ref 39.0–52.0)
HEMOGLOBIN: 8 g/dL — AB (ref 13.0–17.0)
MCH: 28.3 pg (ref 26.0–34.0)
MCHC: 31.5 g/dL (ref 30.0–36.0)
MCV: 89.8 fL (ref 78.0–100.0)
PLATELETS: 28 10*3/uL — AB (ref 150–400)
RBC: 2.83 MIL/uL — AB (ref 4.22–5.81)
RDW: 17.1 % — ABNORMAL HIGH (ref 11.5–15.5)
WBC: 1.5 10*3/uL — AB (ref 4.0–10.5)

## 2015-10-07 LAB — BASIC METABOLIC PANEL
ANION GAP: 8 (ref 5–15)
BUN: 44 mg/dL — ABNORMAL HIGH (ref 6–20)
CHLORIDE: 107 mmol/L (ref 101–111)
CO2: 23 mmol/L (ref 22–32)
Calcium: 9 mg/dL (ref 8.9–10.3)
Creatinine, Ser: 1.91 mg/dL — ABNORMAL HIGH (ref 0.61–1.24)
GFR calc non Af Amer: 29 mL/min — ABNORMAL LOW (ref >60)
GFR, EST AFRICAN AMERICAN: 34 mL/min — AB (ref >60)
Glucose, Bld: 156 mg/dL — ABNORMAL HIGH (ref 65–99)
POTASSIUM: 3.9 mmol/L (ref 3.5–5.1)
SODIUM: 138 mmol/L (ref 135–145)

## 2015-10-07 MED ORDER — CIPROFLOXACIN HCL 250 MG PO TABS
250.0000 mg | ORAL_TABLET | Freq: Two times a day (BID) | ORAL | Status: DC
  Administered 2015-10-07 – 2015-10-08 (×3): 250 mg via ORAL
  Filled 2015-10-07 (×3): qty 1

## 2015-10-07 NOTE — Progress Notes (Signed)
PROGRESS NOTE  [name]  [id number] DOB: 1922-08-16 DOA: 10/04/2015 PCP: Walter Rail, MD Outpatient Specialists:  Subjective: Appears to be improving, low-grade fever of 99.1 last night. No evidence of redness or tenderness in his leg. His urine grew insignificant colony count of Klebsiella and Proteus.  Brief Narrative:  [name] is a 82 y.o. male with medical history significant of AS s/p TAVR in 2016, CHF, lymphoproliferative disorder with severe pancytopenia, PAF, and multiple recent hospitalizations for cellulitis presenting with recurrent cellulitis.  Hospitalized in May for UTI (unrelated).  Hospitalized from 8/2-9/17 and again from 9/1-15/17 both with cellulitis in the left lower leg.  In between hospitalizations he was also treated with doxycycline for recurrence.  Went home on 9/15 and has had home health care since then.  It was doing fine yesterday according to the nurse.  This AM, there was little issue other than waking up with a headache and thirst.  Went to get newspaper and came back and noticed chills.  Through the day, he noticed warmth, progressive redness.  Able to eat and drink.  No further antibiotics since discharge.  Went to Urgent Care and was sent to the ER.    Assessment & Plan:   Principal Problem:   Recurrent cellulitis of lower leg Active Problems:   Essential hypertension   HYPERGLYCEMIA, FASTING   Hypothyroidism   CKD (chronic kidney disease), stage III   S/P TAVR (transcatheter aortic valve replacement)   Lymphoproliferative disorder (HCC)   Anemia due to bone marrow failure (HCC)   Lactic acidosis   Cellulitis   ? Cellulitis, left lower extremity -Patient with multiple prior admissions for cellulitis, now with recurrence in same region -Patient is well-known to me, I have seen him on previous admission. -At low-grade fever of 99.3, leukocytes not very reliable way to tell about infection as he does have myeloproliferative  disorder. -I will hold IV antibiotics and monitor in the hospital and restart if he developed fever chills or worsening of his swelling. -Continue elevation of lower extremity and continue diuretics, currently 5.3 L negative since admission.  Lymphoproliferative disorder with pancytopenia -Followed by heme/onc -Leukopenia, anemia and thrombocytopenia, follow his hemoglobin closely in case he needs transfusion. -Hemoglobin is 8.0, improving with diuresis, check CBC with differential.  Hypothyroidism -Normal TSH in 5/17 -Continue Synthroid at current dose for now  Hyperglycemia -Prior recent A1c was 5.4 -Will not follow/treat at this time as long-term effects of diabetes are unlikely to be a factor in this patient -If marked elevation in glucose, may require treatment for improved wound healing  CKD stage 3-4 -Creatinine is 1.8 on admission, currently is 1.9 around baseline. Check CBC in a.m.  ? UTI -Urinalysis showed Klebsiella oxytoca was 20,000 colony-forming units, Proteus mirabilis at 10,000 CFUs -I'll start on renally adjusted Cipro for 5 days.  DVT prophylaxis: SCD Code Status: DNR Family Communication:  Disposition Plan:  Diet: Diet regular Room service appropriate? Yes; Fluid consistency: Thin  Consultants:   None  Procedures:   None  Antimicrobials:   Vancomycin  Objective: Vitals:   10/06/15 2109 10/07/15 0509 10/07/15 0931 10/07/15 1014  BP: (!) 95/56 (!) 99/52 (!) 92/53   Pulse: 85 78 86   Resp: 16 18 18    Temp: 99.1 F (37.3 C) 98 F (36.7 C) 98 F (36.7 C)   TempSrc:   Oral   SpO2: 100% 100% 100%   Weight: 77.7 kg (171 lb 3.2 oz)   76 kg (167  lb 9.6 oz)  Height:        Intake/Output Summary (Last 24 hours) at 10/07/15 1220 Last data filed at 10/07/15 1014  Gross per 24 hour  Intake              840 ml  Output             4500 ml  Net            -3660 ml   Filed Weights   10/05/15 2108 10/06/15 2109 10/07/15 1014  Weight: 79.8 kg  (176 lb) 77.7 kg (171 lb 3.2 oz) 76 kg (167 lb 9.6 oz)    Examination: General exam: Appears calm and comfortable  Respiratory system: Clear to auscultation. Respiratory effort normal. Cardiovascular system: S1 & S2 heard, RRR. No JVD, murmurs, rubs, gallops or clicks. No pedal edema. Gastrointestinal system: Abdomen is nondistended, soft and nontender. No organomegaly or masses felt. Normal bowel sounds heard. Central nervous system: Alert and oriented. No focal neurological deficits. Extremities: Symmetric 5 x 5 power. Skin: No rashes, lesions or ulcers Psychiatry: Judgement and insight appear normal. Mood & affect appropriate.   Data Reviewed: I have personally reviewed following labs and imaging studies  CBC:  Recent Labs Lab 10/04/15 1512 10/05/15 0531 10/06/15 0742 10/07/15 0605  WBC 5.0 2.1* 1.6* 1.5*  NEUTROABS 4.1  --   --   --   HGB 9.5* 7.2* 7.7* 8.0*  HCT 29.9* 22.7* 24.1* 25.4*  MCV 89.8 89.4 89.3 89.8  PLT 25* 21* 25* 28*   Basic Metabolic Panel:  Recent Labs Lab 10/04/15 1512 10/05/15 0531 10/06/15 0742 10/07/15 0605  NA 135 137 136 138  K 3.9 3.5 3.7 3.9  CL 104 106 107 107  CO2 19* 21* 21* 23  GLUCOSE 139* 129* 155* 156*  BUN 36* 33* 40* 44*  CREATININE 1.84* 1.77* 1.92* 1.91*  CALCIUM 8.9 8.3* 8.7* 9.0   GFR: Estimated Creatinine Clearance: 26 mL/min (by C-G formula based on SCr of 1.91 mg/dL (H)). Liver Function Tests:  Recent Labs Lab 10/04/15 1512  AST 67*  ALT 54  ALKPHOS 112  BILITOT 1.8*  PROT 6.0*  ALBUMIN 3.6   No results for input(s): LIPASE, AMYLASE in the last 168 hours. No results for input(s): AMMONIA in the last 168 hours. Coagulation Profile: No results for input(s): INR, PROTIME in the last 168 hours. Cardiac Enzymes: No results for input(s): CKTOTAL, CKMB, CKMBINDEX, TROPONINI in the last 168 hours. BNP (last 3 results)  Recent Labs  05/09/15 1157  PROBNP 410.0*   HbA1C: No results for input(s): HGBA1C in  the last 72 hours. CBG:  Recent Labs Lab 10/04/15 1913 10/04/15 2253  GLUCAP 168* 141*   Lipid Profile: No results for input(s): CHOL, HDL, LDLCALC, TRIG, CHOLHDL, LDLDIRECT in the last 72 hours. Thyroid Function Tests: No results for input(s): TSH, T4TOTAL, FREET4, T3FREE, THYROIDAB in the last 72 hours. Anemia Panel: No results for input(s): VITAMINB12, FOLATE, FERRITIN, TIBC, IRON, RETICCTPCT in the last 72 hours. Urine analysis:    Component Value Date/Time   COLORURINE YELLOW 10/04/2015 1915   APPEARANCEUR CLEAR 10/04/2015 1915   LABSPEC 1.011 10/04/2015 1915   PHURINE 5.0 10/04/2015 1915   GLUCOSEU NEGATIVE 10/04/2015 1915   HGBUR NEGATIVE 10/04/2015 1915   BILIRUBINUR NEGATIVE 10/04/2015 1915   KETONESUR NEGATIVE 10/04/2015 1915   PROTEINUR NEGATIVE 10/04/2015 1915   UROBILINOGEN 0.2 05/13/2014 2314   NITRITE NEGATIVE 10/04/2015 1915   LEUKOCYTESUR NEGATIVE 10/04/2015 1915  Sepsis Labs: @LABRCNTIP (procalcitonin:4,lacticidven:4)  ) Recent Results (from the past 240 hour(s))  Culture, blood (Routine x 2)     Status: None (Preliminary result)   Collection Time: 10/04/15  5:43 PM  Result Value Ref Range Status   Specimen Description BLOOD RIGHT ANTECUBITAL  Final   Special Requests BOTTLES DRAWN AEROBIC AND ANAEROBIC 5CC  Final   Culture NO GROWTH 2 DAYS  Final   Report Status PENDING  Incomplete  Culture, blood (Routine x 2)     Status: None (Preliminary result)   Collection Time: 10/04/15  5:47 PM  Result Value Ref Range Status   Specimen Description BLOOD LEFT HAND  Final   Special Requests BOTTLES DRAWN AEROBIC ONLY 5CC  Final   Culture NO GROWTH 2 DAYS  Final   Report Status PENDING  Incomplete  Urine culture     Status: Abnormal (Preliminary result)   Collection Time: 10/04/15  7:15 PM  Result Value Ref Range Status   Specimen Description URINE, CLEAN CATCH  Final   Special Requests NONE  Final   Culture (A)  Final    20,000 COLONIES/mL KLEBSIELLA  OXYTOCA 10,000 COLONIES/mL PROTEUS MIRABILIS SUSCEPTIBILITIES TO FOLLOW    Report Status PENDING  Incomplete   Organism ID, Bacteria KLEBSIELLA OXYTOCA (A)  Final      Susceptibility   Klebsiella oxytoca - MIC*    AMPICILLIN >=32 RESISTANT Resistant     CEFAZOLIN >=64 RESISTANT Resistant     CEFTRIAXONE 2 SENSITIVE Sensitive     CIPROFLOXACIN <=0.25 SENSITIVE Sensitive     GENTAMICIN <=1 SENSITIVE Sensitive     IMIPENEM <=0.25 SENSITIVE Sensitive     NITROFURANTOIN 32 SENSITIVE Sensitive     TRIMETH/SULFA <=20 SENSITIVE Sensitive     AMPICILLIN/SULBACTAM >=32 RESISTANT Resistant     PIP/TAZO >=128 RESISTANT Resistant     Extended ESBL NEGATIVE Sensitive     * 20,000 COLONIES/mL KLEBSIELLA OXYTOCA  C difficile quick scan w PCR reflex     Status: None   Collection Time: 10/05/15 10:28 AM  Result Value Ref Range Status   C Diff antigen NEGATIVE NEGATIVE Final   C Diff toxin NEGATIVE NEGATIVE Final   C Diff interpretation No C. difficile detected.  Final     Invalid input(s): PROCALCITONIN, LACTICACIDVEN   Radiology Studies: No results found.  Scheduled Meds: . allopurinol  100 mg Oral Daily  . amiodarone  200 mg Oral Daily  . docusate sodium  100 mg Oral BID  . ferrous sulfate  325 mg Oral BID WC  . furosemide  80 mg Intravenous BID  . levothyroxine  25 mcg Oral QAC breakfast  . metolazone  2.5 mg Oral Daily  . potassium chloride  40 mEq Oral Daily  . propranolol  10 mg Oral BID  . sertraline  50 mg Oral QHS   Continuous Infusions:    LOS: 2 days    Time spent: 35 minutes    Honorio Devol A, MD [facility] Pager [phone]  If 7PM-7AM, please contact night-coverage [email or web address] Password [email or web address] 10/07/2015, 12:20 PM

## 2015-10-08 DIAGNOSIS — D600 Chronic acquired pure red cell aplasia: Secondary | ICD-10-CM

## 2015-10-08 DIAGNOSIS — R7309 Other abnormal glucose: Secondary | ICD-10-CM

## 2015-10-08 DIAGNOSIS — E038 Other specified hypothyroidism: Secondary | ICD-10-CM

## 2015-10-08 DIAGNOSIS — I10 Essential (primary) hypertension: Secondary | ICD-10-CM

## 2015-10-08 DIAGNOSIS — N183 Chronic kidney disease, stage 3 (moderate): Secondary | ICD-10-CM

## 2015-10-08 DIAGNOSIS — D479 Neoplasm of uncertain behavior of lymphoid, hematopoietic and related tissue, unspecified: Secondary | ICD-10-CM

## 2015-10-08 LAB — BASIC METABOLIC PANEL
Anion gap: 11 (ref 5–15)
BUN: 55 mg/dL — ABNORMAL HIGH (ref 6–20)
CALCIUM: 9.1 mg/dL (ref 8.9–10.3)
CO2: 24 mmol/L (ref 22–32)
CREATININE: 2.06 mg/dL — AB (ref 0.61–1.24)
Chloride: 102 mmol/L (ref 101–111)
GFR calc non Af Amer: 27 mL/min — ABNORMAL LOW (ref >60)
GFR, EST AFRICAN AMERICAN: 31 mL/min — AB (ref >60)
Glucose, Bld: 158 mg/dL — ABNORMAL HIGH (ref 65–99)
Potassium: 4.2 mmol/L (ref 3.5–5.1)
SODIUM: 137 mmol/L (ref 135–145)

## 2015-10-08 MED ORDER — CIPROFLOXACIN HCL 250 MG PO TABS
250.0000 mg | ORAL_TABLET | Freq: Every evening | ORAL | Status: DC
  Administered 2015-10-08: 250 mg via ORAL
  Filled 2015-10-08: qty 1

## 2015-10-08 MED ORDER — FUROSEMIDE 80 MG PO TABS
80.0000 mg | ORAL_TABLET | Freq: Two times a day (BID) | ORAL | Status: DC
  Administered 2015-10-08 – 2015-10-09 (×2): 80 mg via ORAL
  Filled 2015-10-08 (×2): qty 1

## 2015-10-08 NOTE — Progress Notes (Signed)
Patient refused bed alarm. Advised to call for any assistance before getting up. Call bell within reach. Onesty Clair, Wonda Cheng, Therapist, sports

## 2015-10-08 NOTE — Progress Notes (Signed)
PROGRESS NOTE  [name]  [id number] DOB: June 17, 1922 DOA: 10/04/2015 PCP: Binnie Rail, MD Outpatient Specialists:  Subjective: Seen with his daughter at bedside, denies any new complaints, feels much better. 7.5 L negative since admission, still has +1 to +2 edema but I will switch to oral Lasix.  Brief Narrative:  [name] is a 82 y.o. male with medical history significant of AS s/p TAVR in 2016, CHF, lymphoproliferative disorder with severe pancytopenia, PAF, and multiple recent hospitalizations for cellulitis presenting with recurrent cellulitis.  Hospitalized in May for UTI (unrelated).  Hospitalized from 8/2-9/17 and again from 9/1-15/17 both with cellulitis in the left lower leg.  In between hospitalizations he was also treated with doxycycline for recurrence.  Went home on 9/15 and has had home health care since then.  It was doing fine yesterday according to the nurse.  This AM, there was little issue other than waking up with a headache and thirst.  Went to get newspaper and came back and noticed chills.  Through the day, he noticed warmth, progressive redness.  Able to eat and drink.  No further antibiotics since discharge.  Went to Urgent Care and was sent to the ER.    Assessment & Plan:   Principal Problem:   Recurrent cellulitis of lower leg Active Problems:   Essential hypertension   HYPERGLYCEMIA, FASTING   Hypothyroidism   CKD (chronic kidney disease), stage III   S/P TAVR (transcatheter aortic valve replacement)   Lymphoproliferative disorder (HCC)   Anemia due to bone marrow failure (HCC)   Lactic acidosis   Cellulitis   ? Cellulitis, left lower extremity -Patient with multiple prior admissions for cellulitis, now with recurrence in same region -Patient is well-known to me, I have seen him on previous admission. -At low-grade fever of 99.3, leukocytes not very reliable way to tell about infection as he does have myeloproliferative disorder. -I will  hold IV antibiotics and monitor in the hospital and restart if he developed fever chills or worsening of his swelling.  Fluid overload with lower extremity edema -No evidence of CHF as the lungs very clear, started on IV diuresis with Lasix and metolazone. -Currently 7.5 L negative since admission switch to oral Lasix  Lymphoproliferative disorder with pancytopenia -Followed by heme/onc -Leukopenia, anemia and thrombocytopenia, follow his hemoglobin closely in case he needs transfusion. -Hemoglobin is 8.0, improving with diuresis, check CBC with differential.  Hypothyroidism -Normal TSH in 5/17 -Continue Synthroid at current dose for now  Hyperglycemia -Prior recent A1c was 5.4 -Will not follow/treat at this time as long-term effects of diabetes are unlikely to be a factor in this patient -If marked elevation in glucose, may require treatment for improved wound healing  CKD stage 3-4 -Creatinine is 1.8 on admission, currently is 1.9 around baseline. Check CBC in a.m.  ? UTI -Urinalysis showed Klebsiella oxytoca was 20,000 colony-forming units, Proteus mirabilis at 10,000 CFUs -I'll start on renally adjusted Cipro for 5 days.  DVT prophylaxis: SCD, thrombocytopenia Code Status: DNR Family Communication:  Disposition Plan:  Diet: Diet regular Room service appropriate? Yes; Fluid consistency: Thin  Consultants:   None  Procedures:   None  Antimicrobials:   Vancomycin  Objective: Vitals:   10/07/15 1700 10/07/15 2041 10/08/15 0451 10/08/15 0931  BP: 105/69 105/66 119/68 (!) 101/53  Pulse: 94 94 (!) 106 73  Resp: 18 19 18 18   Temp: 98.2 F (36.8 C) 98.8 F (37.1 C) 98 F (36.7 C) 97.7 F (36.5 C)  TempSrc: Oral Oral Oral Oral  SpO2: 100% 100% 100% 100%  Weight:  76.2 kg (167 lb 15.9 oz)  74.6 kg (164 lb 6.4 oz)  Height:        Intake/Output Summary (Last 24 hours) at 10/08/15 1001 Last data filed at 10/08/15 0900  Gross per 24 hour  Intake               840 ml  Output             3175 ml  Net            -2335 ml   Filed Weights   10/07/15 1014 10/07/15 2041 10/08/15 0931  Weight: 76 kg (167 lb 9.6 oz) 76.2 kg (167 lb 15.9 oz) 74.6 kg (164 lb 6.4 oz)    Examination: General exam: Appears calm and comfortable  Respiratory system: Clear to auscultation. Respiratory effort normal. Cardiovascular system: S1 & S2 heard, RRR. No JVD, murmurs, rubs, gallops or clicks. No pedal edema. Gastrointestinal system: Abdomen is nondistended, soft and nontender. No organomegaly or masses felt. Normal bowel sounds heard. Central nervous system: Alert and oriented. No focal neurological deficits. Extremities: Symmetric 5 x 5 power. Skin: No rashes, lesions or ulcers Psychiatry: Judgement and insight appear normal. Mood & affect appropriate.   Data Reviewed: I have personally reviewed following labs and imaging studies  CBC:  Recent Labs Lab 10/04/15 1512 10/05/15 0531 10/06/15 0742 10/07/15 0605  WBC 5.0 2.1* 1.6* 1.5*  NEUTROABS 4.1  --   --   --   HGB 9.5* 7.2* 7.7* 8.0*  HCT 29.9* 22.7* 24.1* 25.4*  MCV 89.8 89.4 89.3 89.8  PLT 25* 21* 25* 28*   Basic Metabolic Panel:  Recent Labs Lab 10/04/15 1512 10/05/15 0531 10/06/15 0742 10/07/15 0605 10/08/15 0541  NA 135 137 136 138 137  K 3.9 3.5 3.7 3.9 4.2  CL 104 106 107 107 102  CO2 19* 21* 21* 23 24  GLUCOSE 139* 129* 155* 156* 158*  BUN 36* 33* 40* 44* 55*  CREATININE 1.84* 1.77* 1.92* 1.91* 2.06*  CALCIUM 8.9 8.3* 8.7* 9.0 9.1   GFR: Estimated Creatinine Clearance: 24.1 mL/min (by C-G formula based on SCr of 2.06 mg/dL (H)). Liver Function Tests:  Recent Labs Lab 10/04/15 1512  AST 67*  ALT 54  ALKPHOS 112  BILITOT 1.8*  PROT 6.0*  ALBUMIN 3.6   No results for input(s): LIPASE, AMYLASE in the last 168 hours. No results for input(s): AMMONIA in the last 168 hours. Coagulation Profile: No results for input(s): INR, PROTIME in the last 168 hours. Cardiac  Enzymes: No results for input(s): CKTOTAL, CKMB, CKMBINDEX, TROPONINI in the last 168 hours. BNP (last 3 results)  Recent Labs  05/09/15 1157  PROBNP 410.0*   HbA1C: No results for input(s): HGBA1C in the last 72 hours. CBG:  Recent Labs Lab 10/04/15 1913 10/04/15 2253  GLUCAP 168* 141*   Lipid Profile: No results for input(s): CHOL, HDL, LDLCALC, TRIG, CHOLHDL, LDLDIRECT in the last 72 hours. Thyroid Function Tests: No results for input(s): TSH, T4TOTAL, FREET4, T3FREE, THYROIDAB in the last 72 hours. Anemia Panel: No results for input(s): VITAMINB12, FOLATE, FERRITIN, TIBC, IRON, RETICCTPCT in the last 72 hours. Urine analysis:    Component Value Date/Time   COLORURINE YELLOW 10/04/2015 1915   APPEARANCEUR CLEAR 10/04/2015 1915   LABSPEC 1.011 10/04/2015 1915   PHURINE 5.0 10/04/2015 1915   GLUCOSEU NEGATIVE 10/04/2015 [place] NEGATIVE 10/04/2015 1915  BILIRUBINUR NEGATIVE 10/04/2015 1915   KETONESUR NEGATIVE 10/04/2015 1915   PROTEINUR NEGATIVE 10/04/2015 1915   UROBILINOGEN 0.2 05/13/2014 2314   NITRITE NEGATIVE 10/04/2015 1915   LEUKOCYTESUR NEGATIVE 10/04/2015 1915   Sepsis Labs: @LABRCNTIP (procalcitonin:4,lacticidven:4)  ) Recent Results (from the past 240 hour(s))  Culture, blood (Routine x 2)     Status: None (Preliminary result)   Collection Time: 10/04/15  5:43 PM  Result Value Ref Range Status   Specimen Description BLOOD RIGHT ANTECUBITAL  Final   Special Requests BOTTLES DRAWN AEROBIC AND ANAEROBIC 5CC  Final   Culture NO GROWTH 3 DAYS  Final   Report Status PENDING  Incomplete  Culture, blood (Routine x 2)     Status: None (Preliminary result)   Collection Time: 10/04/15  5:47 PM  Result Value Ref Range Status   Specimen Description BLOOD LEFT HAND  Final   Special Requests BOTTLES DRAWN AEROBIC ONLY 5CC  Final   Culture NO GROWTH 3 DAYS  Final   Report Status PENDING  Incomplete  Urine culture     Status: Abnormal (Preliminary result)    Collection Time: 10/04/15  7:15 PM  Result Value Ref Range Status   Specimen Description URINE, CLEAN CATCH  Final   Special Requests NONE  Final   Culture (A)  Final    20,000 COLONIES/mL KLEBSIELLA OXYTOCA 10,000 COLONIES/mL PROTEUS MIRABILIS SUSCEPTIBILITIES TO FOLLOW    Report Status PENDING  Incomplete   Organism ID, Bacteria KLEBSIELLA OXYTOCA (A)  Final      Susceptibility   Klebsiella oxytoca - MIC*    AMPICILLIN >=32 RESISTANT Resistant     CEFAZOLIN >=64 RESISTANT Resistant     CEFTRIAXONE 2 SENSITIVE Sensitive     CIPROFLOXACIN <=0.25 SENSITIVE Sensitive     GENTAMICIN <=1 SENSITIVE Sensitive     IMIPENEM <=0.25 SENSITIVE Sensitive     NITROFURANTOIN 32 SENSITIVE Sensitive     TRIMETH/SULFA <=20 SENSITIVE Sensitive     AMPICILLIN/SULBACTAM >=32 RESISTANT Resistant     PIP/TAZO >=128 RESISTANT Resistant     Extended ESBL NEGATIVE Sensitive     * 20,000 COLONIES/mL KLEBSIELLA OXYTOCA  C difficile quick scan w PCR reflex     Status: None   Collection Time: 10/05/15 10:28 AM  Result Value Ref Range Status   C Diff antigen NEGATIVE NEGATIVE Final   C Diff toxin NEGATIVE NEGATIVE Final   C Diff interpretation No C. difficile detected.  Final     Invalid input(s): PROCALCITONIN, LACTICACIDVEN   Radiology Studies: No results found.  Scheduled Meds: . allopurinol  100 mg Oral Daily  . amiodarone  200 mg Oral Daily  . ciprofloxacin  250 mg Oral BID  . docusate sodium  100 mg Oral BID  . ferrous sulfate  325 mg Oral BID WC  . furosemide  80 mg Intravenous BID  . levothyroxine  25 mcg Oral QAC breakfast  . metolazone  2.5 mg Oral Daily  . potassium chloride  40 mEq Oral Daily  . propranolol  10 mg Oral BID  . sertraline  50 mg Oral QHS   Continuous Infusions:    LOS: 3 days    Time spent: 35 minutes    Kameka Whan A, MD [facility] Pager [phone]  If 7PM-7AM, please contact night-coverage [email or web address] Password [email or web address] 10/08/2015, 10:01 AM

## 2015-10-09 LAB — CULTURE, BLOOD (ROUTINE X 2)
CULTURE: NO GROWTH
Culture: NO GROWTH

## 2015-10-09 LAB — BASIC METABOLIC PANEL
Anion gap: 11 (ref 5–15)
BUN: 62 mg/dL — AB (ref 6–20)
CALCIUM: 9.4 mg/dL (ref 8.9–10.3)
CHLORIDE: 101 mmol/L (ref 101–111)
CO2: 27 mmol/L (ref 22–32)
CREATININE: 2.17 mg/dL — AB (ref 0.61–1.24)
GFR calc non Af Amer: 25 mL/min — ABNORMAL LOW (ref >60)
GFR, EST AFRICAN AMERICAN: 29 mL/min — AB (ref >60)
Glucose, Bld: 209 mg/dL — ABNORMAL HIGH (ref 65–99)
Potassium: 4 mmol/L (ref 3.5–5.1)
SODIUM: 139 mmol/L (ref 135–145)

## 2015-10-09 MED ORDER — FUROSEMIDE 80 MG PO TABS: 80.0000 mg | ORAL_TABLET | Freq: Two times a day (BID) | ORAL | 6 refills | Status: DC

## 2015-10-09 MED ORDER — CIPROFLOXACIN HCL 250 MG PO TABS: 250.0000 mg | ORAL_TABLET | Freq: Two times a day (BID) | ORAL | 0 refills | Status: DC

## 2015-10-09 NOTE — Discharge Summary (Signed)
Physician Discharge Summary  [name] [id number] DOB: July 07, 1922 DOA: 10/04/2015  PCP: Binnie Rail, MD  Admit date: 10/04/2015 Discharge date: 10/09/2015  Admitted From: Home Disposition: Home  Recommendations for Outpatient Follow-up:  1. Follow up with PCP in 1-2 weeks, Refer to nephrology. 2. Please obtain BMP/CBC in one week 3. Lasix changed to 80 mg twice a day, continue compressive stockings.  Home Health: N/A Equipment/Devices: N/A  Discharge Condition: Stable CODE STATUS: DO NOT RESUSCITATE Diet recommendation: Heart Healthy  Brief/Interim Summary: [name] a 82 y.o.malewith medical history significant of AS s/p TAVR in 2016, CHF, lymphoproliferative disorder with severe pancytopenia, PAF, and multiple recent hospitalizations for cellulitis presenting with recurrent cellulitis. Hospitalized in May for UTI (unrelated). Hospitalized from 8/2-9/17 and again from 9/1-15/17both with cellulitis in the left lower leg. In between hospitalizations he was also treated with doxycycline for recurrence. Went home on 9/15 and has had home health care since then. It was doing fine yesterday according tothe nurse. This AM, there was little issue other than waking up with a headache and thirst. Went to get newspaper and came back and noticed chills. Through the day, he noticed warmth, progressive redness. Able to eat and drink. No further antibiotics since discharge. Went to Urgent Care and was sent to the ER.   Discharge Diagnoses:  Principal Problem:   Recurrent cellulitis of lower leg Active Problems:   Essential hypertension   HYPERGLYCEMIA, FASTING   Hypothyroidism   CKD (chronic kidney disease), stage III   S/P TAVR (transcatheter aortic valve replacement)   Lymphoproliferative disorder (HCC)   Anemia due to bone marrow failure (HCC)   Lactic acidosis   Cellulitis   ? Cellulitis, left lower extremity -Patient with multiple prior admissions for  cellulitis, now with recurrence of symptoms in same region -Patient is well-known to me, I have seen him on previous admission. -At low-grade fever of 99.3, leukocytes not very reliable way to tell about infection as he does have myeloproliferative disorder. -Antibiotics discontinued after the first dose and monitored in the hospital. -I do not think he has cellulitis, patient improved only with IV diuresis.  Fluid overload with lower extremity edema -No evidence of CHF as the lungs very clear, started on IV diuresis with Lasix and metolazone. -On day of discharge its 9.4 L negative since admission, he lost 11 pounds. -This is likely secondary to stage IV chronic kidney disease, needs referral to nephrology as outpatient  Lymphoproliferative disorder with pancytopenia -Followed by heme/onc -Leukopenia, anemia and thrombocytopenia, follow his hemoglobin closely in case he needs transfusion. -Hemoglobin is 8.0, improving with diuresis, check CBC with differential.  Hypothyroidism -NormalTSH in 5/17 -Continue Synthroid at current dose for now  Hyperglycemia -Prior recent A1c was 5.4 -Will not follow/treat at this time as long-term effects of diabetes are unlikely to be a factor in this patient -If marked elevation in glucose, may require treatment for improved wound healing  CKD stage 3-4 -Creatinine is 1.8 on admission, currently is 1.9 around baseline.  -Discharged with creatinine of 2.1, Lasix increased to 80 mg twice a day along with metolazone. -Needs follow-up with nephrology as outpatient  ? UTI -Urinalysis showed Klebsiella oxytoca was 20,000 colony-forming units, Proteus mirabilis at 10,000 CFUs -Cipro for total 5 days.   Discharge Instructions  Discharge Instructions    Diet - low sodium heart healthy    Complete by:  As directed    Increase activity slowly    Complete by:  As directed  Medication List    TAKE these medications   allopurinol 100 MG  tablet Commonly known as:  ZYLOPRIM Take 100 mg by mouth daily.   amiodarone 200 MG tablet Commonly known as:  PACERONE Take 1 tablet by mouth daily What changed:  how much to take  how to take this  when to take this  additional instructions   ciprofloxacin 250 MG tablet Commonly known as:  CIPRO Take 1 tablet (250 mg total) by mouth 2 (two) times daily.   clonazePAM 0.5 MG tablet Commonly known as:  KLONOPIN Take 0.25 mg by mouth daily as needed (Take 1/2 tablet once daily as needed if you wake up early).   colchicine 0.6 MG tablet Take 2 tabs once and then 1 tab one hour later for gout flare   ferrous sulfate 325 (65 FE) MG tablet Take 325 mg by mouth 2 (two) times daily with a meal.   fish oil-omega-3 fatty acids 1000 MG capsule Take 1 g by mouth daily.   Flax Seed Oil 1000 MG Caps Take 1 capsule by mouth daily.   furosemide 80 MG tablet Commonly known as:  LASIX Take 1 tablet (80 mg total) by mouth 2 (two) times daily. Take 80mg  in the morning and 40mg  in the afternoon What changed:  how much to take  how to take this  when to take this   levothyroxine 25 MCG tablet Commonly known as:  SYNTHROID, LEVOTHROID Take 25 mcg by mouth daily before breakfast.   metolazone 2.5 MG tablet Commonly known as:  ZAROXOLYN Take 1 tablet (2.5 mg total) by mouth daily as needed (edema).   multivitamin tablet Take 1 tablet by mouth daily.   omeprazole 20 MG capsule Commonly known as:  PRILOSEC TAKE 1 CAPSULE EVERY DAY  (REPLACES  PANTOPRAZOLE)   potassium chloride 10 MEQ tablet Commonly known as:  K-DUR Take 1 tablet (10 mEq total) by mouth daily.   propranolol 10 MG tablet Commonly known as:  INDERAL Take 1 tablet (10 mg total) by mouth 2 (two) times daily.   sertraline 50 MG tablet Commonly known as:  ZOLOFT TAKE 1 TABLET (50 MG TOTAL) BY MOUTH AT BEDTIME.   traMADol 50 MG tablet Commonly known as:  ULTRAM Take 50 mg by mouth every 8 (eight) hours as  needed (pain). Reported on 06/28/2015   vitamin C 500 MG tablet Commonly known as:  ASCORBIC ACID Take 1 tablet (500 mg total) by mouth daily.   Vitamin D 1000 units capsule Take 1 capsule (1,000 Units total) by mouth daily.       Allergies  Allergen Reactions  . Penicillins Other (See Comments)    08/15/2015:  Tolerated Cefepime Does not remember rxn (~1950) Has patient had a PCN reaction causing immediate rash, facial/tongue/throat swelling, SOB or lightheadedness with hypotension:YES Has patient had a PCN reaction causing severe rash involving mucus membranes or skin necrosis: NO Has patient had a PCN reaction that required hospitalization NO Has patient had a PCN reaction occurring within the last 10 years: NO If all of the above answers are "NO", then may proceed with Cephalosporin use.  [name] Kitchen Neomycin-Bacitracin Zn-Polymyx Rash and Other (See Comments)    ? Reaction (thinks he remembers redness)    Consultations:  None   Procedures/Studies: US Pelvis Limited  Result Date: 09/19/2015 CLINICAL DATA:  Complains of pain in the right inguinal canal for 1 day. History of hernia repair. Left testicle removed. EXAM: LIMITED ULTRASOUND OF PELVIS TECHNIQUE: Limited transabdominal  ultrasound examination of the pelvis was performed. COMPARISON:  CT scan 05/18/2015 FINDINGS: Limited ultrasound of the right inguinal region performed. No herniated bowel loops visualized within the right inguinal canal. No focal fluid collections. Incidentally noted is a small hydrocele in the right scrotal sac. IMPRESSION: 1. No scintigraphic evidence for bowel containing right inguinal hernia 2. Incidental note made of small right hydrocele Electronically Signed   By: Donavan Foil M.D.   On: 09/19/2015 19:29   Mr Tibia Fibula Left Wo Contrast  Result Date: 09/20/2015 CLINICAL DATA:  Left leg cellulitis in patient with lymphoproliferative disorder and pancytopenia. Question abscess or osteomyelitis. EXAM: MRI  OF LOWER LEFT EXTREMITY WITHOUT CONTRAST TECHNIQUE: Multiplanar, multisequence MR imaging of the left lower leg was performed. No intravenous contrast was administered. COMPARISON:  MRI left lower leg 08/14/2015 in 09/13/2015. FINDINGS: Bones/Joint/Cartilage The axial and coronal sequences incidentally include the right lower leg. Bone marrow signal is normal bilaterally without evidence of osteomyelitis or worrisome marrow lesion. Ligaments Limited visualization demonstrates no abnormality. Muscles and Tendons Mild to moderate fatty atrophy of lower leg musculature bilaterally appears symmetric. Intermediate increased T2 signal in lower leg musculature and is now diffuse compared to the prior study with marked increase on the right seen. No focal fluid collection is identified. Soft tissues Extensive subcutaneous edema is seen about the lower legs bilaterally. Changes appear fairly symmetric and have worsened on the right since the prior study. IMPRESSION: Increased subcutaneous and intramuscular edema about both lower legs since the prior examination. Findings could be due to cellulitis and myositis. Dependent change causing subcutaneous edema and rhabdomyolysis causing intramuscular edema are also considerations. Negative for abscess or osteomyelitis. Electronically Signed   By: Inge Rise M.D.   On: 09/20/2015 08:08   Mr Tibia Fibula Left Wo Contrast  Result Date: 09/13/2015 CLINICAL DATA:  Recurrent left lower leg and ankle pain and swelling. Question cellulitis. Diabetic patient. No known injury. EXAM: MRI OF LOWER LEFT EXTREMITY WITHOUT CONTRAST; MRI OF THE LEFT ANKLE WITHOUT CONTRAST TECHNIQUE: Multiplanar, multisequence MR imaging of the left lower leg and ankle was performed. No intravenous contrast was administered. COMPARISON:  MRI left lower leg 08/14/2015. FINDINGS: Bones/Joint/Cartilage The axial and coronal sequences incidentally include the right lower leg. There is no bone marrow signal  abnormality to suggest osteomyelitis. No fracture or stress change is seen. No worrisome marrow lesion is identified. Ligaments Intact. Muscles and Tendons A small amount of fluid is seen about the posteromedial tendons of the ankles bilaterally suggestive of tenosynovitis. No tendon tear is identified. Musculature of the lower leg demonstrates some fatty atrophy which appears symmetric from right to left. No intramuscular fluid collection is identified. Mild edema is seen in the extensor digitorum longus, flexor digitorum longus and tibialis anterior muscles bilaterally, more notable on the left. This could be due to myositis or related to atrophy. Soft tissues Cutaneous and subcutaneous edema is seen bilaterally and worse on the left. Edematous change is progressive inferiorly. The appearance is mildly improved compared to the prior MRI. No focal fluid collection is identified. IMPRESSION: Bilateral lower leg cutaneous and subcutaneous edema is worse on the left and appears slightly improved compared to the prior study. Asymmetry is most in keeping with cellulitis. No abscess or osteomyelitis. Mild edema in the extensor digitorum longus, flexor digitorum longus and tibialis anterior muscles bilaterally appears slightly worse on the left and could be related to mild atrophy or myositis. Small amount of fluid about the posteromedial tendons of the  ankles bilaterally is compatible with tenosynovitis without tendon tear. Electronically Signed   By: Inge Rise M.D.   On: 09/13/2015 08:36   Mr Ankle Left  Wo Contrast  Result Date: 09/20/2015 CLINICAL DATA:  Recurrent left lower extremity cellulitis and patient with lymphoproliferative disease and pancytopenia. EXAM: MRI OF THE LEFT ANKLE WITHOUT CONTRAST TECHNIQUE: Multiplanar, multisequence MR imaging of the ankle was performed. No intravenous contrast was administered. COMPARISON:  MRI left ankle 09/13/2015. MRI left lower leg 09/13/2015 and this same day.  FINDINGS: Subcutaneous edema without focal fluid collection is seen about the ankle and imaged foot. TENDONS Peroneal: Intact. Posteromedial: Intact. A small amount of fluid about the posteromedial tendons is not notably changed. Anterior: Intact. Achilles: Intact. Plantar Fascia: Unremarkable. LIGAMENTS Lateral: Intact. Medial: Intact. CARTILAGE Ankle Joint: Unremarkable. Subtalar Joints/Sinus Tarsi: Unremarkable. Bones: Normal marrow signal throughout. Other: None. IMPRESSION: Subcutaneous edema about the ankle and foot could be due to dependent change or cellulitis. Negative for abscess or osteomyelitis. Small amount of fluid about the posteromedial tendons is compatible with tenosynovitis and unchanged. Mild edema in the flexor hallucis longus muscle as seen on MRI left lower leg this same day. Electronically Signed   By: Inge Rise M.D.   On: 09/20/2015 08:17   Mr Ankle Left  Wo Contrast  Result Date: 09/13/2015 CLINICAL DATA:  Recurrent left lower leg and ankle pain and swelling. Question cellulitis. Diabetic patient. No known injury. EXAM: MRI OF LOWER LEFT EXTREMITY WITHOUT CONTRAST; MRI OF THE LEFT ANKLE WITHOUT CONTRAST TECHNIQUE: Multiplanar, multisequence MR imaging of the left lower leg and ankle was performed. No intravenous contrast was administered. COMPARISON:  MRI left lower leg 08/14/2015. FINDINGS: Bones/Joint/Cartilage The axial and coronal sequences incidentally include the right lower leg. There is no bone marrow signal abnormality to suggest osteomyelitis. No fracture or stress change is seen. No worrisome marrow lesion is identified. Ligaments Intact. Muscles and Tendons A small amount of fluid is seen about the posteromedial tendons of the ankles bilaterally suggestive of tenosynovitis. No tendon tear is identified. Musculature of the lower leg demonstrates some fatty atrophy which appears symmetric from right to left. No intramuscular fluid collection is identified. Mild edema is  seen in the extensor digitorum longus, flexor digitorum longus and tibialis anterior muscles bilaterally, more notable on the left. This could be due to myositis or related to atrophy. Soft tissues Cutaneous and subcutaneous edema is seen bilaterally and worse on the left. Edematous change is progressive inferiorly. The appearance is mildly improved compared to the prior MRI. No focal fluid collection is identified. IMPRESSION: Bilateral lower leg cutaneous and subcutaneous edema is worse on the left and appears slightly improved compared to the prior study. Asymmetry is most in keeping with cellulitis. No abscess or osteomyelitis. Mild edema in the extensor digitorum longus, flexor digitorum longus and tibialis anterior muscles bilaterally appears slightly worse on the left and could be related to mild atrophy or myositis. Small amount of fluid about the posteromedial tendons of the ankles bilaterally is compatible with tenosynovitis without tendon tear. Electronically Signed   By: Inge Rise M.D.   On: 09/13/2015 08:36    (Echo, Carotid, EGD, Colonoscopy, ERCP)    Subjective:   Discharge Exam: Vitals:   10/08/15 2112 10/09/15 0501  BP: 109/69 95/62  Pulse: 94 80  Resp: 19 18  Temp: 98.4 F (36.9 C) 97.9 F (36.6 C)   Vitals:   10/08/15 0451 10/08/15 0931 10/08/15 2112 10/09/15 0501  BP: 119/68 [name] Kitchen)  101/53 109/69 95/62  Pulse: (!) 106 73 94 80  Resp: 18 18 19 18   Temp: 98 F (36.7 C) 97.7 F (36.5 C) 98.4 F (36.9 C) 97.9 F (36.6 C)  TempSrc: Oral Oral Oral Oral  SpO2: 100% 100% 100% 100%  Weight:  74.6 kg (164 lb 6.4 oz) 74.7 kg (164 lb 11.2 oz)   Height:        General: Pt is alert, awake, not in acute distress Cardiovascular: RRR, S1/S2 +, no rubs, no gallops Respiratory: CTA bilaterally, no wheezing, no rhonchi Abdominal: Soft, NT, ND, bowel sounds + Extremities: no edema, no cyanosis    The results of significant diagnostics from this hospitalization (including  imaging, microbiology, ancillary and laboratory) are listed below for reference.     Microbiology: Recent Results (from the past 240 hour(s))  Culture, blood (Routine x 2)     Status: None (Preliminary result)   Collection Time: 10/04/15  5:43 PM  Result Value Ref Range Status   Specimen Description BLOOD RIGHT ANTECUBITAL  Final   Special Requests BOTTLES DRAWN AEROBIC AND ANAEROBIC 5CC  Final   Culture NO GROWTH 4 DAYS  Final   Report Status PENDING  Incomplete  Culture, blood (Routine x 2)     Status: None (Preliminary result)   Collection Time: 10/04/15  5:47 PM  Result Value Ref Range Status   Specimen Description BLOOD LEFT HAND  Final   Special Requests BOTTLES DRAWN AEROBIC ONLY 5CC  Final   Culture NO GROWTH 4 DAYS  Final   Report Status PENDING  Incomplete  Urine culture     Status: Abnormal (Preliminary result)   Collection Time: 10/04/15  7:15 PM  Result Value Ref Range Status   Specimen Description URINE, CLEAN CATCH  Final   Special Requests NONE  Final   Culture (A)  Final    20,000 COLONIES/mL KLEBSIELLA OXYTOCA 10,000 COLONIES/mL PROTEUS MIRABILIS Sent to [facility] for further susceptibility testing.    Report Status PENDING  Incomplete   Organism ID, Bacteria KLEBSIELLA OXYTOCA (A)  Final      Susceptibility   Klebsiella oxytoca - MIC*    AMPICILLIN >=32 RESISTANT Resistant     CEFAZOLIN >=64 RESISTANT Resistant     CEFTRIAXONE 2 SENSITIVE Sensitive     CIPROFLOXACIN <=0.25 SENSITIVE Sensitive     GENTAMICIN <=1 SENSITIVE Sensitive     IMIPENEM <=0.25 SENSITIVE Sensitive     NITROFURANTOIN 32 SENSITIVE Sensitive     TRIMETH/SULFA <=20 SENSITIVE Sensitive     AMPICILLIN/SULBACTAM >=32 RESISTANT Resistant     PIP/TAZO >=128 RESISTANT Resistant     Extended ESBL NEGATIVE Sensitive     * 20,000 COLONIES/mL KLEBSIELLA OXYTOCA  C difficile quick scan w PCR reflex     Status: None   Collection Time: 10/05/15 10:28 AM  Result Value Ref Range Status   C Diff  antigen NEGATIVE NEGATIVE Final   C Diff toxin NEGATIVE NEGATIVE Final   C Diff interpretation No C. difficile detected.  Final     Labs: BNP (last 3 results)  Recent Labs  05/17/15 2329  BNP [id number]*   Basic Metabolic Panel:  Recent Labs Lab 10/05/15 0531 10/06/15 0742 10/07/15 0605 10/08/15 0541 10/09/15 0529  NA 137 136 138 137 139  K 3.5 3.7 3.9 4.2 4.0  CL 106 107 107 102 101  CO2 21* 21* 23 24 27   GLUCOSE 129* 155* 156* 158* 209*  BUN 33* 40* 44* 55* 62*  CREATININE 1.77* 1.92*  1.91* 2.06* 2.17*  CALCIUM 8.3* 8.7* 9.0 9.1 9.4   Liver Function Tests:  Recent Labs Lab 10/04/15 1512  AST 67*  ALT 54  ALKPHOS 112  BILITOT 1.8*  PROT 6.0*  ALBUMIN 3.6   No results for input(s): LIPASE, AMYLASE in the last 168 hours. No results for input(s): AMMONIA in the last 168 hours. CBC:  Recent Labs Lab 10/04/15 1512 10/05/15 0531 10/06/15 0742 10/07/15 0605  WBC 5.0 2.1* 1.6* 1.5*  NEUTROABS 4.1  --   --   --   HGB 9.5* 7.2* 7.7* 8.0*  HCT 29.9* 22.7* 24.1* 25.4*  MCV 89.8 89.4 89.3 89.8  PLT 25* 21* 25* 28*   Cardiac Enzymes: No results for input(s): CKTOTAL, CKMB, CKMBINDEX, TROPONINI in the last 168 hours. BNP: Invalid input(s): POCBNP CBG:  Recent Labs Lab 10/04/15 1913 10/04/15 2253  GLUCAP 168* 141*   D-Dimer No results for input(s): DDIMER in the last 72 hours. Hgb A1c No results for input(s): HGBA1C in the last 72 hours. Lipid Profile No results for input(s): CHOL, HDL, LDLCALC, TRIG, CHOLHDL, LDLDIRECT in the last 72 hours. Thyroid function studies No results for input(s): TSH, T4TOTAL, T3FREE, THYROIDAB in the last 72 hours.  Invalid input(s): FREET3 Anemia work up No results for input(s): VITAMINB12, FOLATE, FERRITIN, TIBC, IRON, RETICCTPCT in the last 72 hours. Urinalysis    Component Value Date/Time   COLORURINE YELLOW 10/04/2015 1915   APPEARANCEUR CLEAR 10/04/2015 1915   LABSPEC 1.011 10/04/2015 1915   PHURINE 5.0  10/04/2015 1915   GLUCOSEU NEGATIVE 10/04/2015 1915   HGBUR NEGATIVE 10/04/2015 1915   BILIRUBINUR NEGATIVE 10/04/2015 1915   KETONESUR NEGATIVE 10/04/2015 1915   PROTEINUR NEGATIVE 10/04/2015 1915   UROBILINOGEN 0.2 05/13/2014 2314   NITRITE NEGATIVE 10/04/2015 1915   LEUKOCYTESUR NEGATIVE 10/04/2015 1915   Sepsis Labs Invalid input(s): PROCALCITONIN,  WBC,  LACTICIDVEN Microbiology Recent Results (from the past 240 hour(s))  Culture, blood (Routine x 2)     Status: None (Preliminary result)   Collection Time: 10/04/15  5:43 PM  Result Value Ref Range Status   Specimen Description BLOOD RIGHT ANTECUBITAL  Final   Special Requests BOTTLES DRAWN AEROBIC AND ANAEROBIC 5CC  Final   Culture NO GROWTH 4 DAYS  Final   Report Status PENDING  Incomplete  Culture, blood (Routine x 2)     Status: None (Preliminary result)   Collection Time: 10/04/15  5:47 PM  Result Value Ref Range Status   Specimen Description BLOOD LEFT HAND  Final   Special Requests BOTTLES DRAWN AEROBIC ONLY 5CC  Final   Culture NO GROWTH 4 DAYS  Final   Report Status PENDING  Incomplete  Urine culture     Status: Abnormal (Preliminary result)   Collection Time: 10/04/15  7:15 PM  Result Value Ref Range Status   Specimen Description URINE, CLEAN CATCH  Final   Special Requests NONE  Final   Culture (A)  Final    20,000 COLONIES/mL KLEBSIELLA OXYTOCA 10,000 COLONIES/mL PROTEUS MIRABILIS Sent to [facility] for further susceptibility testing.    Report Status PENDING  Incomplete   Organism ID, Bacteria KLEBSIELLA OXYTOCA (A)  Final      Susceptibility   Klebsiella oxytoca - MIC*    AMPICILLIN >=32 RESISTANT Resistant     CEFAZOLIN >=64 RESISTANT Resistant     CEFTRIAXONE 2 SENSITIVE Sensitive     CIPROFLOXACIN <=0.25 SENSITIVE Sensitive     GENTAMICIN <=1 SENSITIVE Sensitive     IMIPENEM <=0.25 SENSITIVE  Sensitive     NITROFURANTOIN 32 SENSITIVE Sensitive     TRIMETH/SULFA <=20 SENSITIVE Sensitive      AMPICILLIN/SULBACTAM >=32 RESISTANT Resistant     PIP/TAZO >=128 RESISTANT Resistant     Extended ESBL NEGATIVE Sensitive     * 20,000 COLONIES/mL KLEBSIELLA OXYTOCA  C difficile quick scan w PCR reflex     Status: None   Collection Time: 10/05/15 10:28 AM  Result Value Ref Range Status   C Diff antigen NEGATIVE NEGATIVE Final   C Diff toxin NEGATIVE NEGATIVE Final   C Diff interpretation No C. difficile detected.  Final     Time coordinating discharge: Over 30 minutes  SIGNED:   Birdie Hopes, MD  [facility] 10/09/2015, 9:34 AM Pager   If 7PM-7AM, please contact night-coverage [email or web address] Password [email or web address]

## 2015-10-10 ENCOUNTER — Other Ambulatory Visit: Payer: Self-pay

## 2015-10-10 NOTE — Patient Outreach (Signed)
Transition of care:  Admitted on 10/04/2015 Discharged on 10/09/2015  Placed call to patient who reports that he is doing good. States no swelling and no shortness of breath. Reports weight today of 160.4 pounds  Reports that he has follow up planned with primary MD tomorrow. States that his daughter is home with him and will take him to his appointments.  Reports no swelling and no shortness of breath. Reports that he has all medications.Reviewed change of lasix to twice a day and addition of Cipro. Patient states that he understands.  PLAN: Will plan to continue transition of care calls weekly. Will follow up with [facility] to ensure that they are aware patient is back home. Placed call to [facility], In with [facility] and informed of patients recent discharge.  [facility] CM Care Plan Problem One   Flowsheet Row Most Recent Value  Care Plan Problem One  Recent hospital admission related to cellulitis of leg  Role Documenting the Problem One  Care Management [place] for Problem One  Active  THN Long Term Goal (31-90 days)  Patient will report no readmissions in the next 31 days.  THN Long Term Goal Start Date  10/17/15  THN Long Term Goal Met Date  10/04/15  Interventions for Problem One Long Term Goal  Reviewed discharged instructions, reviewed changes in medications, Placed call to [facility] and informed for discharge date of 10/1. Spoke with Estill Bamberg.   THN CM Short Term Goal #1 (0-30 days)  Patient will record CBG daily for the next 30 days.  THN CM Short Term Goal #1 Start Date  10/10/15  Interventions for Short Term Goal #1  Encouraged patient to avoid sweets and continue to monitor CBG daily. Review reasons to call Md for high or low readings.  THN CM Short Term Goal #2 (0-30 days)  Patient will call MD for worsening leg infection in the next 30 days.  THN CM Short Term Goal #2 Start Date  10/10/15  Interventions for Short Term Goal #2  reviewed  signs and symptoms or infection, fever. placed call to MD to report increased weight and finding of leg today during home visit.  THN CM Short Term Goal #3 (0-30 days)  Patient will report weighing daily for the next 30 days.  THN CM Short Term Goal #3 Start Date  10/10/15  Interventions for Short [place]ern Goal #3  Placed call to [facility] to restart services. Reviewed importance of daily weights and call MD with weight gains.      Tomasa Rand, RN, BSN, CEN [facility] [employer] [phone]

## 2015-10-11 ENCOUNTER — Encounter: Payer: Self-pay | Admitting: Internal Medicine

## 2015-10-11 ENCOUNTER — Telehealth: Payer: Self-pay | Admitting: Nurse Practitioner

## 2015-10-11 ENCOUNTER — Other Ambulatory Visit (INDEPENDENT_AMBULATORY_CARE_PROVIDER_SITE_OTHER): Payer: Commercial Managed Care - HMO

## 2015-10-11 ENCOUNTER — Telehealth: Payer: Self-pay | Admitting: *Deleted

## 2015-10-11 ENCOUNTER — Ambulatory Visit (INDEPENDENT_AMBULATORY_CARE_PROVIDER_SITE_OTHER): Payer: Commercial Managed Care - HMO | Admitting: Internal Medicine

## 2015-10-11 VITALS — BP 88/62 | HR 81 | Temp 97.6°F | Resp 16 | Wt 166.0 lb

## 2015-10-11 DIAGNOSIS — N183 Chronic kidney disease, stage 3 unspecified: Secondary | ICD-10-CM

## 2015-10-11 DIAGNOSIS — R600 Localized edema: Secondary | ICD-10-CM | POA: Diagnosis not present

## 2015-10-11 DIAGNOSIS — L03116 Cellulitis of left lower limb: Secondary | ICD-10-CM | POA: Diagnosis not present

## 2015-10-11 DIAGNOSIS — N390 Urinary tract infection, site not specified: Secondary | ICD-10-CM

## 2015-10-11 DIAGNOSIS — I10 Essential (primary) hypertension: Secondary | ICD-10-CM

## 2015-10-11 DIAGNOSIS — N184 Chronic kidney disease, stage 4 (severe): Secondary | ICD-10-CM

## 2015-10-11 DIAGNOSIS — E118 Type 2 diabetes mellitus with unspecified complications: Secondary | ICD-10-CM

## 2015-10-11 LAB — COMPREHENSIVE METABOLIC PANEL
ALK PHOS: 102 U/L (ref 39–117)
ALT: 23 U/L (ref 0–53)
AST: 29 U/L (ref 0–37)
Albumin: 3.9 g/dL (ref 3.5–5.2)
BUN: 90 mg/dL — AB (ref 6–23)
CO2: 26 mEq/L (ref 19–32)
CREATININE: 2.56 mg/dL — AB (ref 0.40–1.50)
Calcium: 9 mg/dL (ref 8.4–10.5)
Chloride: 93 mEq/L — ABNORMAL LOW (ref 96–112)
GFR: 25.13 mL/min — ABNORMAL LOW (ref >60.00)
GLUCOSE: 167 mg/dL — AB (ref 70–99)
POTASSIUM: 4.2 meq/L (ref 3.5–5.1)
SODIUM: 134 meq/L — AB (ref 135–145)
TOTAL PROTEIN: 6.9 g/dL (ref 6.0–8.3)
Total Bilirubin: 0.7 mg/dL (ref 0.2–1.2)

## 2015-10-11 NOTE — Assessment & Plan Note (Signed)
Recent worsening due to increased Lasix dose We'll refer to nephrology CMP today-may need to adjust Lasix dose

## 2015-10-11 NOTE — Assessment & Plan Note (Signed)
Sugar elevated this morning, which is likely related to what he ate last night He will continue to monitor Ideally would like to avoid medication if possible given his kidney function Continue to monitor

## 2015-10-11 NOTE — Patient Instructions (Addendum)
  Test(s) ordered today. Your results will be released to [place] (or called to you) after review, usually within 72hours after test completion. If any changes need to be made, you will be notified at that same time.   Medications reviewed and updated.  No changes recommended at this time.  We will advise on any changes with medication based on your blood work today.   A referral was ordered for a kidney specialist.

## 2015-10-11 NOTE — Assessment & Plan Note (Signed)
Much improved and minimal at this time with Lasix 80 mg twice daily and after IV diuresis in the hospital Currently taking Lasix 80 mg twice daily We'll check CMP-may need to revise dose Continue compression socks, elevating legs when sitting and low-sodium diet

## 2015-10-11 NOTE — Telephone Encounter (Signed)
Patient called to reschedule appt. 10/11/15

## 2015-10-11 NOTE — Progress Notes (Signed)
Pre visit review using our clinic review tool, if applicable. No additional management support is needed unless otherwise documented below in the visit note. 

## 2015-10-11 NOTE — Progress Notes (Signed)
Subjective:    Patient ID: [name], male    DOB: 1922/05/28, 82 y.o.   MRN: [id number]  HPI The patient is here for follow up from the hospital.  He was admitted 9/26 and discharged 10/1.He was admitted with recurrent cellulitis of the left lower leg and fluid overload.  The day of admission he was experiencing chills, headache, increased thirst, warmth and progressive redness in the leg. He was given antibiotics initially, but those were discontinued and his leg was just monitor. His symptoms improved with IV diuresis.  He was on IV Lasix and metolazone while in the hospital and his home Lasix was changed to 80 mg twice a day instead of 80 mg in the morning and 40 mg in the evening. He lost 11 pounds in the hospital. His creatinine increased slightly while in the hospital with the increased diuresis from 1.8 on admission to 2.1. It was recommended that he see a nephrologist as an outpatient. He did receive Cipro for 5 days for mild UTI.  His weight yesterday at home was 160.4 at home.  His weight today at home was 160.6.  He has one dose left of the antibiotic for the UTI. He denies any urinary symptoms including dysuria, hematuria or difficulty initiating urination.  He feels well since leaving the hospital. He denies any chest pain, palpitations, shortness of breath, headaches and lightheadedness. He denies any edema in his legs are the best they have been in a very long time. He has no edema. There is no skin breakdown or ulcers.  He is taking 80 mg of Lasix twice daily. He does have excessive urination.  History was slightly elevated today. It was over 200. He did have a cookie and symmetrical heart rates last night. Given. Sugars are typically better controlled. He is currently not on any diabetic medication.  Medications and allergies reviewed with patient and updated if appropriate.  Patient Active Problem List   Diagnosis Date Noted  . Cellulitis 10/05/2015  . Tenosynovitis  of ankle 09/20/2015  . Hernia of abdominal cavity   . Lactic acidosis 09/17/2015  . Metabolic acidosis [id number]  . Hyperkalemia 09/14/2015  . Throat cancer ([place])   . Anemia due to bone marrow failure ([place])   . Recurrent cellulitis of lower leg 09/09/2015  . Lymphoproliferative disorder ([place])   . QT prolongation   . GERD (gastroesophageal reflux disease) 10/04/2014  . Chronic diastolic congestive heart failure ([place]) 10/04/2014  . Impingement syndrome of right shoulder region 05/21/2014  . Severe aortic stenosis 05/14/2014  . S/P TAVR (transcatheter aortic valve replacement) 05/14/2014  . Severe aortic insufficiency 04/05/2014  . Depression 10/02/2013  . Aortic atherosclerosis ([place]) 01/06/2013  . Paroxysmal atrial fibrillation ([place]) 01/06/2013  . Lower extremity edema 01/06/2013  . CKD (chronic kidney disease), stage III 01/06/2013  . Protein-calorie malnutrition, severe ([place]) 12/13/2012  . Hypothyroidism 02/27/2011  . Restless legs 02/27/2011  . Other pancytopenia ([place]) 12/06/2009  . HYPERURICEMIA, ASYMPTOMATIC 07/25/2009  . HYPERGLYCEMIA, FASTING 01/05/2009  . CAD, NATIVE VESSEL 12/21/2008  . Type 2 diabetes mellitus with complication ([place]) [id number]  . Aortic valve disorder 05/15/2008  . ACTION TREMOR 01/14/2007  . POPLITEAL CYST, RIGHT 11/05/2006  . Gout 05/13/2006  . Thrombocytopenia ([place]) 05/13/2006  . ANXIETY 05/13/2006  . Essential hypertension 05/13/2006  . CVA 05/13/2006  . [place] DISEASE 05/13/2006  . Splenomegaly 05/13/2006    Current Outpatient Prescriptions on File Prior to Visit  Medication Sig Dispense Refill  .  allopurinol (ZYLOPRIM) 100 MG tablet Take 100 mg by mouth daily.    [name] Kitchen amiodarone (PACERONE) 200 MG tablet Take 1 tablet by mouth daily (Patient taking differently: Take 200 mg by mouth daily. Take 1 tablet by mouth daily) 90 tablet 0  . Cholecalciferol (VITAMIN D) 1000 UNITS capsule Take 1 capsule (1,000 Units total) by mouth daily. 90  capsule 3  . ciprofloxacin (CIPRO) 250 MG tablet Take 1 tablet (250 mg total) by mouth 2 (two) times daily. 6 tablet 0  . clonazePAM (KLONOPIN) 0.5 MG tablet Take 0.25 mg by mouth daily as needed (Take 1/2 tablet once daily as needed if you wake up early).    . colchicine 0.6 MG tablet Take 2 tabs once and then 1 tab one hour later for gout flare 30 tablet 2  . ferrous sulfate 325 (65 FE) MG tablet Take 325 mg by mouth 2 (two) times daily with a meal.    . fish oil-omega-3 fatty acids 1000 MG capsule Take 1 g by mouth daily.      . Flaxseed, Linseed, (FLAX SEED OIL) 1000 MG CAPS Take 1 capsule by mouth daily.     . furosemide (LASIX) 80 MG tablet Take 1 tablet (80 mg total) by mouth 2 (two) times daily. Take 80mg  in the morning and 40mg  in the afternoon 60 tablet 6  . levothyroxine (SYNTHROID, LEVOTHROID) 25 MCG tablet Take 25 mcg by mouth daily before breakfast.    . metolazone (ZAROXOLYN) 2.5 MG tablet Take 1 tablet (2.5 mg total) by mouth daily as needed (edema). 30 tablet 3  . Multiple Vitamin (MULTIVITAMIN) tablet Take 1 tablet by mouth daily.      [name] Kitchen omeprazole (PRILOSEC) 20 MG capsule TAKE 1 CAPSULE EVERY DAY  (REPLACES  PANTOPRAZOLE) 90 capsule 3  . potassium chloride (K-DUR) 10 MEQ tablet Take 1 tablet (10 mEq total) by mouth daily. 90 tablet 1  . propranolol (INDERAL) 10 MG tablet Take 1 tablet (10 mg total) by mouth 2 (two) times daily. 180 tablet 3  . sertraline (ZOLOFT) 50 MG tablet TAKE 1 TABLET (50 MG TOTAL) BY MOUTH AT BEDTIME. 90 tablet 0  . traMADol (ULTRAM) 50 MG tablet Take 50 mg by mouth every 8 (eight) hours as needed (pain). Reported on 06/28/2015    . vitamin C (ASCORBIC ACID) 500 MG tablet Take 1 tablet (500 mg total) by mouth daily. 90 tablet 3   No current facility-administered medications on file prior to visit.     Past Medical History:  Diagnosis Date  . Action tremor   . Anxiety   . Aortic stenosis    a. Previously severe -> s/p minimally invasive tissue AVR  with Dr. Evelina Dun at [facility] 01/2011 (pre-AVR cath with no obs CAD);  b. 05/2014 s/p TAVR (23 mm Edwards Sapien 3).  . Bacteremia    a. 12/2012 - S bovis;  b. 12/2012 TEE w/o veg;  c. Seeing ID->Rocephin therapy extended to 01/25/2013 via PICC for possible endocarditis (No veg on TEE).  . Bradycardia   . Cellulitis 09/09/2015   LEFT LEG  . Cellulitis of left leg 10/11-16/2012  . Chronic diastolic CHF (congestive heart failure) ([place])    a. 12/15/2012 TEE: EF 60-65%, no veg.  . CKD (chronic kidney disease), stage III   . Colon polyp   . Degenerative disc disease   . Diabetes mellitus    "borderline" (01/05/2013)  . Diverticulosis   . GERD (gastroesophageal reflux disease)   . Gout   .  H/O hiatal hernia   . High cholesterol   . History of blood transfusion 01/2011; 11/2012  . Hypertension   . Hypotension   . Hypothyroidism   . Joint effusion, knee    left knee  . Leukopenia    Chronic pancytopenia  . PAF (paroxysmal atrial fibrillation) (HCC)    a. amiodarone therapy; not felt to be a candidate for anticoagulation with pancytopenia. b. Back in AF 09/2014 - decision was made to pursue rate control only.  . Pancytopenia ([place])    a. possible chronic lymphoproliferative disorder or splenic lymphoma.  . QT prolongation   . S/P TAVR (transcatheter aortic valve replacement)    a. 05/14/2014 TAVR: 23 mm Edwards Sapien 3 transcatheter heart valve placed valve-in-valve for prosthetic valve dysfunction via open right transfemoral approach  . Splenomegaly   . Stroke ([place])   . Synovial cyst of popliteal space   . Throat cancer ([place])    s/p lasered  . Thrombocytopenia (HCC)    Dr. Benay Spice    Past Surgical History:  Procedure Laterality Date  . AORTIC VALVE REPLACEMENT  01/23/2011   via minimally invasive approach per Dr Evelina Dun, [facility]  . CARDIAC CATHETERIZATION    . CARDIAC VALVE REPLACEMENT    . CHOLECYSTECTOMY    . EXCISIONAL HEMORRHOIDECTOMY    . INGUINAL HERNIA REPAIR Right   . joint effusion      left knee  . LESION REMOVAL Right 08/02/2015   Procedure: EXCISION  RIGHT EAR SKIN CANCER;  Surgeon: Rozetta Nunnery, MD;  Location: [place];  Service: ENT;  Laterality: Right;  LOCAL  . 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 SURGERY SCROTAL / TESTICULAR     "removed one" (01/05/2013)  . TEE WITHOUT CARDIOVERSION N/A 12/15/2012   Procedure: TRANSESOPHAGEAL ECHOCARDIOGRAM (TEE);  Surgeon: [name], MD;  Location: [place];  Service: Cardiovascular;  Laterality: N/A;  . TEE WITHOUT CARDIOVERSION N/A 03/26/2014   Procedure: TRANSESOPHAGEAL ECHOCARDIOGRAM (TEE);  Surgeon: Josue Hector, MD;  Location: [place];  Service: Cardiovascular;  Laterality: N/A;  . TEE WITHOUT CARDIOVERSION N/A 05/14/2014   Procedure: TRANSESOPHAGEAL ECHOCARDIOGRAM (TEE);  Surgeon: [name], MD;  Location: [place];  Service: Open Heart Surgery;  Laterality: N/A;  . TRANSCATHETER AORTIC VALVE REPLACEMENT, TRANSFEMORAL Right 05/14/2014   Procedure: TRANSCATHETER AORTIC VALVE REPLACEMENT, TRANSFEMORAL;  Surgeon: [name], MD;  Location: [place];  Service: Open Heart Surgery;  Laterality: Right;    Social History   Social History  . Marital status: Widowed    Spouse name: N/A  . Number of children: 3  . Years of education: N/A   Occupational History  . Retired Agricultural consultant Retired   Social History Main Topics  . Smoking status: Former Smoker    Packs/day: 0.75    Years: 24.00    Types: Cigarettes    Quit date: 01/11/1962  . Smokeless tobacco: Never Used  . Alcohol use No  . Drug use: No  . Sexual activity: No   Other Topics Concern  . Not on file   Social History Narrative   [place]ives at his farm outside of [place]   Lives with his wife   Smoked until 1964 about 7 cigarettes a day for 30 years   No alcohol history.   No drug  history   Very  active, no regimented exercise                Family History  Problem Relation Age of Onset  . Lung cancer Mother   . Heart disease Father   . Colon cancer Other   . Stroke Other   . Cancer Brother     Review of Systems  Constitutional: Negative for chills and fever.  Respiratory: Negative for cough, shortness of breath and wheezing.   Cardiovascular: Negative for chest pain, palpitations and leg swelling.  Gastrointestinal: Negative for abdominal pain and nausea.  Neurological: Negative for light-headedness and headaches.       Objective:   Vitals:   10/11/15 0911  BP: (!) 88/62  Pulse: 81  Resp: 16  Temp: 97.6 F (36.4 C)   Filed Weights   10/11/15 0911  Weight: 166 lb (75.3 kg)   Body mass index is 23.82 kg/m.   Physical Exam    Constitutional: Appears well-developed and well-nourished. No distress.  HENT:  Head: Normocephalic and atraumatic.  Neck: Neck supple. No tracheal deviation present. No thyromegaly present.  No cervical lymphadenopathy Cardiovascular: Normal rate, regular rhythm and normal heart sounds.   3/6 systolic murmur heard.   Trace edema bilateral lower extremities-chronic skin changes Pulmonary/Chest: Effort normal and breath sounds normal. No respiratory distress. No has no wheezes. No rales.  Skin: Skin is warm and dry. Not diaphoretic.  chronic ecchymosis from Thrombocytopenia   Psychiatric: Normal mood and affect. Behavior is normal.      Assessment & Plan:    See Problem List for Assessment and Plan of chronic medical problems.

## 2015-10-11 NOTE — Telephone Encounter (Signed)
Kidney function is worse. Please have him try taking 80 mg of Lasix twice a day 1 day and then 80 mg in the morning and 40 mg in the afternoon the following day. Have him alternate every other day.  Recheck kidney function Thursday or Friday-blood work ordered  [employer] please ask Cecille Rubin or [facility] to send referral to [facility] as soon as possible. Thank you.

## 2015-10-11 NOTE — Assessment & Plan Note (Signed)
Mild infection diagnosed with in the Hospital 1 day left of 5 days of Cipro Asymptomatic and no need to recheck urine

## 2015-10-11 NOTE — Assessment & Plan Note (Signed)
Concern for possible cellulitis upon admission-only received antibiotics initially and then they were discontinued. Symptoms improved without additional antibiotics Symptoms of possible cellulitis likely related to increased edema associated with erythema No additional diuretics at this time We will monitor the leg for redness, warmth and pain and we will try to keep the leg edema controlled

## 2015-10-11 NOTE — Telephone Encounter (Signed)
Spoke with pt to inform. Cecille Rubin is working on referral for pt.

## 2015-10-11 NOTE — Assessment & Plan Note (Signed)
Blood pressure on low side-asymptomatic Low today likely related to increased Lasix dose Continue current medication for now CMP

## 2015-10-11 NOTE — Telephone Encounter (Signed)
CRITICAL LAB BUN = 90

## 2015-10-12 LAB — SUSCEPTIBILITY RESULT

## 2015-10-12 LAB — SUSCEPTIBILITY, AER + ANAEROB

## 2015-10-14 ENCOUNTER — Other Ambulatory Visit (INDEPENDENT_AMBULATORY_CARE_PROVIDER_SITE_OTHER): Payer: Commercial Managed Care - HMO

## 2015-10-14 DIAGNOSIS — N184 Chronic kidney disease, stage 4 (severe): Secondary | ICD-10-CM | POA: Diagnosis not present

## 2015-10-14 LAB — BASIC METABOLIC PANEL
BUN: 100 mg/dL (ref 6–23)
CALCIUM: 9.1 mg/dL (ref 8.4–10.5)
CHLORIDE: 91 meq/L — AB (ref 96–112)
CO2: 26 meq/L (ref 19–32)
Creatinine, Ser: 2.58 mg/dL — ABNORMAL HIGH (ref 0.40–1.50)
GFR: 24.9 mL/min — ABNORMAL LOW (ref >60.00)
GLUCOSE: 198 mg/dL — AB (ref 70–99)
POTASSIUM: 3.3 meq/L — AB (ref 3.5–5.1)
SODIUM: 129 meq/L — AB (ref 135–145)

## 2015-10-15 LAB — URINE CULTURE

## 2015-10-17 ENCOUNTER — Other Ambulatory Visit: Payer: Self-pay

## 2015-10-17 NOTE — Patient Outreach (Signed)
Transition of care call: Placed call to patient who returned call. Patient reports that he is doing well except that he has little energy and feels weak. States that his weight went up about 4-5 pounds and he took a metolazone today. Reports difficulty sleeping.   Patient states that he has an appointment with Dr. Benay Spice tomorrow and an appointment with kidney doctor on Friday 10/21/2015.   Patient reports that he continues to see [facility].  Denies any new problems or concerns today.  PLAN: Encouraged patient to call if needed. Reminded of the importance of daily weights and need to inform MD of weight gain. Will plan to continue weekly transition of care calls.  Tomasa Rand, RN, BSN, CEN [facility] [employer] [phone]

## 2015-10-18 ENCOUNTER — Ambulatory Visit (HOSPITAL_BASED_OUTPATIENT_CLINIC_OR_DEPARTMENT_OTHER): Payer: Commercial Managed Care - HMO

## 2015-10-18 ENCOUNTER — Ambulatory Visit (HOSPITAL_BASED_OUTPATIENT_CLINIC_OR_DEPARTMENT_OTHER): Payer: Commercial Managed Care - HMO | Admitting: Nurse Practitioner

## 2015-10-18 ENCOUNTER — Telehealth: Payer: Self-pay | Admitting: Cardiovascular Disease

## 2015-10-18 VITALS — BP 97/57 | HR 83 | Temp 97.7°F | Resp 17 | Ht 70.0 in | Wt 172.8 lb

## 2015-10-18 DIAGNOSIS — D631 Anemia in chronic kidney disease: Secondary | ICD-10-CM | POA: Diagnosis not present

## 2015-10-18 DIAGNOSIS — D61818 Other pancytopenia: Secondary | ICD-10-CM

## 2015-10-18 DIAGNOSIS — D696 Thrombocytopenia, unspecified: Secondary | ICD-10-CM | POA: Diagnosis not present

## 2015-10-18 DIAGNOSIS — N183 Chronic kidney disease, stage 3 (moderate): Secondary | ICD-10-CM | POA: Diagnosis not present

## 2015-10-18 DIAGNOSIS — L0390 Cellulitis, unspecified: Secondary | ICD-10-CM

## 2015-10-18 DIAGNOSIS — E119 Type 2 diabetes mellitus without complications: Secondary | ICD-10-CM

## 2015-10-18 DIAGNOSIS — R161 Splenomegaly, not elsewhere classified: Secondary | ICD-10-CM

## 2015-10-18 LAB — CBC WITH DIFFERENTIAL/PLATELET
BASO%: 0.2 % (ref 0.0–2.0)
Basophils Absolute: 0 10*3/uL (ref 0.0–0.1)
EOS%: 1.1 % (ref 0.0–7.0)
Eosinophils Absolute: 0.1 10*3/uL (ref 0.0–0.5)
HCT: 32 % — ABNORMAL LOW (ref 38.4–49.9)
HGB: 11 g/dL — ABNORMAL LOW (ref 13.0–17.1)
LYMPH%: 13.6 % — AB (ref 14.0–49.0)
MCH: 28.6 pg (ref 27.2–33.4)
MCHC: 34.4 g/dL (ref 32.0–36.0)
MCV: 83.3 fL (ref 79.3–98.0)
MONO#: 0.3 10*3/uL (ref 0.1–0.9)
MONO%: 5.3 % (ref 0.0–14.0)
NEUT%: 79.8 % — ABNORMAL HIGH (ref 39.0–75.0)
NEUTROS ABS: 3.7 10*3/uL (ref 1.5–6.5)
Platelets: 47 10*3/uL — ABNORMAL LOW (ref 140–400)
RBC: 3.84 10*6/uL — AB (ref 4.20–5.82)
RDW: 16.4 % — ABNORMAL HIGH (ref 11.0–14.6)
WBC: 4.7 10*3/uL (ref 4.0–10.3)
lymph#: 0.6 10*3/uL — ABNORMAL LOW (ref 0.9–3.3)
nRBC: 0 % (ref 0–0)

## 2015-10-18 NOTE — Telephone Encounter (Signed)
I spoke with [name] and she said the pt has had a 6 lb weight gain since last Friday.  I contacted the pt and he complains of abdominal bloating and lower extremity swelling.  The pt recently had lab work on 10/14/15 by PCP and due to elevated BUN/Creatinine the pt's Furosemide was decreased to 80mg  in the morning and 40mg  in evening.  The pt did take a metolazone yesterday but did not have any change in his weight today.  The pt has a pending appointment with his kidney doctor on Friday.  I instructed the pt to take an additional metolazone tomorrow morning prior to Furosemide dosage.  I advised the pt that due to his symptoms he may require hospitalization if weight and swelling do not decrease. PT will be assessing the pt tomorrow and the nurse will visit on Thursday. We will have them contact the office if symptoms continue. Pt agreed with plan.

## 2015-10-18 NOTE — Telephone Encounter (Signed)
[name] is calling because Mr. Amour has had a weight gain of 6lbs within the past 5 days . Please call  Thanks

## 2015-10-18 NOTE — Progress Notes (Addendum)
  [name] OFFICE PROGRESS NOTE   Diagnosis:  Pancytopenia  INTERVAL HISTORY:   [name] returns for follow-up. He was hospitalized 10/04/2015 through 10/09/2015 with recurrent cellulitis. He denies fever. No bleeding. No shortness of breath. He is having trouble sleeping. He reports he is gaining weight.  Objective:  Vital signs in last 24 hours:  Blood pressure (!) 97/57, pulse 83, temperature 97.7 F (36.5 C), temperature source Oral, resp. rate 17, height _0  (1.778 m), weight 172 lb 12.8 oz (78.4 kg), SpO2 100 %.    HEENT: No thrush or ulcers. Several small ecchymoses at the right buccal mucosa. Resp: Lungs clear bilaterally. Cardio: Irregular. GI: Abdomen is soft. No apparent ascites. No hepatomegaly. Spleen palpable left mid/lateral abdomen with associated tenderness. Vascular: Trace firm edema at the lower legs bilaterally. He is wearing support stockings. Skin: Diffuse dark discoloration over the forearms.    Lab Results:  Lab Results  Component Value Date   WBC 4.7 10/18/2015   HGB 11.0 (L) 10/18/2015   HCT 32.0 (L) 10/18/2015   MCV 83.3 10/18/2015   PLT 47 (L) 10/18/2015   NEUTROABS 3.7 10/18/2015    Imaging:  No results found.  Medications: I have reviewed the patient's current medications.  Assessment/Plan: 1. Chronic pancytopenia/splenomegaly-likely related to a chronic lymphoproliferative disorder,? Splenic lymphoma versus cirrhosis  Bone marrow biopsy at [facility] November 2012-slightly hypercellular marrow with trilineage hematopoiesis, interstitial Nieman-Pick like histiocytosis. Negative for dysplasia, negative for lymphoma, negative for increased blasts. 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 History of severe aortic stenosis, status post aortic valve replacement surgery at [facility] on 01/23/2011; status post transcatheter aortic valve replacement 05/14/2014  3. History of gout   4. Diabetes  5. Streptococcus bacteremia-TEE negative for endocarditis  6. Malaise-likely multifactorial 7. Renal insufficiency  8. Paroxysmal atrial fibrillation  9. Colon polyps noted on a virtual colonoscopy 02/10/2013  10. Anemia secondary to renal insufficiency and the chronic lymphoproliferative disorder. Trial of weekly erythropoietin initiated 04/09/2014 with improvement. The anemia progressed. Erythropoietin resumed 07/02/2014. Discontinued 09/29/2014 secondary to patient not experiencing clinical benefit 11. Right ear skin lesion-possibly a basal cell carcinoma, observe for now 12. Hospitalization with pyelonephritis in [place] April 2017 13. Admission 05/18/2015 with urosepsis 14. Admission 09/09/2015 through 09/23/2015 with left leg cellulitis 15. Admission 10/04/2015 through 10/09/2015 with left leg cellulitis   Disposition: [name] appears unchanged. He has chronic pancytopenia/splenomegaly. Plan is for continued observation. We will check a CBC today.   He has had multiple recent admissions with recurrent cellulitis. We will obtain immunoglobulin levels today.   He will return for a follow-up visit and CBC in 6 weeks. He will contact the office in the interim with any problems.  We recommended he contact Dr. Burt Knack regarding the weight gain.  Patient seen with Dr. Benay Spice.   Betsy Coder ANP/GNP-BC   10/18/2015  2:27 PM  This was a shared visit with Ned Card. [name] appears unchanged. The hemoglobin and platelet count are improved today. He has a history of recurrent admissions with cellulitis. We will check his immunoglobulin levels.  Julieanne Manson, M.D.

## 2015-10-19 LAB — IGG, IGA, IGM
IgA, Qn, Serum: 168 mg/dL (ref 61–437)
IgG, Qn, Serum: 706 mg/dL (ref 700–1600)
IgM, Qn, Serum: 346 mg/dL — ABNORMAL HIGH (ref 15–143)

## 2015-10-20 NOTE — Telephone Encounter (Signed)
Follow Up   Olivia Mackie [facility]) because Mr. Seeton weight still remains the same as it was on yesterday before he took the extra diuretic . There has been no change int he weight . Please call   Can call Olivia Mackie at [phone] or call the patient   Thanks

## 2015-10-20 NOTE — Telephone Encounter (Signed)
I spoke with patient. He c/o fatigue and no weight increase of decrease.  He denies SOB and CP at this point.  He states he followed Lauren's advise and added metolazone-did not help.  Pt admits to having appt w/ renal doc tomorrow.  Taking to Dr Rayann Heman (DOD) to advise.  Per Dr Rayann Heman: Since no change from Tuesday, keep nephrology appt tomorrow.    I advised patient I would forward this message to Lauren and Dr Burt Knack to be sure he does not have any further advise.  I advised patient if he developed SOB or CP to call 911. He voiced understanding and agreed with above plan.

## 2015-10-22 ENCOUNTER — Other Ambulatory Visit: Payer: Self-pay | Admitting: Cardiovascular Disease

## 2015-10-22 NOTE — ED Provider Notes (Signed)
[facility] DEPT Provider Note  CSN: [place]:[id number] Arrival Date & Time: 10/04/15 @ [id number]  History    Chief Complaint Chief Complaint  Patient presents with  . Cellulitis    HPI [name] is a 82 y.o. male.  Patient presents emergency part for assessment of her concern for dehydration and generalized headache. Headache was not sudden in onset and there is no associated altered mental status near syncope vertigo or syncopal event. Patient denies neck pain fevers and chills.  Patient believes this is related to recent hospitalizations for cellulitis of the left lower leg and he was recently hospitalized from 8/2 to 9/17 and again from 9/1 to 9/17. Patient states he has noticed increased redness and warmth throughout the day.  Was seen at outside urgent care and told to come to the emergency department. Patient denies chest pain shortness of breath emesis or nausea.  Past Medical & Surgical History    Past Medical History:  Diagnosis Date  . Action tremor   . Anxiety   . Aortic stenosis    a. Previously severe -> s/p minimally invasive tissue AVR with Dr. Evelina Dun at [facility] 01/2011 (pre-AVR cath with no obs CAD);  b. 05/2014 s/p TAVR (23 mm Edwards Sapien 3).  . Bacteremia    a. 12/2012 - S bovis;  b. 12/2012 TEE w/o veg;  c. Seeing ID->Rocephin therapy extended to 01/25/2013 via PICC for possible endocarditis (No veg on TEE).  . Bradycardia   . Cellulitis 09/09/2015   LEFT LEG  . Cellulitis of left leg 10/11-16/2012  . Chronic diastolic CHF (congestive heart failure) ([place])    a. 12/15/2012 TEE: EF 60-65%, no veg.  . CKD (chronic kidney disease), stage III   . Colon polyp   . Degenerative disc disease   . Diabetes mellitus    "borderline" (01/05/2013)  . Diverticulosis   . GERD (gastroesophageal reflux disease)   . Gout   . H/O hiatal hernia   . High cholesterol   . History of blood transfusion 01/2011; 11/2012  . Hypertension   . Hypotension   . Hypothyroidism   . Joint  effusion, knee    left knee  . Leukopenia    Chronic pancytopenia  . PAF (paroxysmal atrial fibrillation) (HCC)    a. amiodarone therapy; not felt to be a candidate for anticoagulation with pancytopenia. b. Back in AF 09/2014 - decision was made to pursue rate control only.  . Pancytopenia ([place])    a. possible chronic lymphoproliferative disorder or splenic lymphoma.  . QT prolongation   . S/P TAVR (transcatheter aortic valve replacement)    a. 05/14/2014 TAVR: 23 mm Edwards Sapien 3 transcatheter heart valve placed valve-in-valve for prosthetic valve dysfunction via open right transfemoral approach  . Splenomegaly   . Stroke ([place])   . Synovial cyst of popliteal space   . Throat cancer ([place])    s/p lasered  . Thrombocytopenia ([place])    Dr. Benay Spice   Patient Active Problem List   Diagnosis Date Noted  . Cellulitis 10/05/2015  . Tenosynovitis of ankle 09/20/2015  . Hernia of abdominal cavity   . Lactic acidosis 09/17/2015  . Metabolic acidosis [id number]  . Hyperkalemia 09/14/2015  . Throat cancer ([place])   . Anemia due to bone marrow failure ([place])   . Recurrent cellulitis of lower leg 09/09/2015  . Lymphoproliferative disorder ([place])   . Urinary tract infection 05/18/2015  . QT prolongation   . GERD (gastroesophageal reflux disease) 10/04/2014  . Chronic diastolic  congestive heart failure ([place]) 10/04/2014  . Impingement syndrome of right shoulder region 05/21/2014  . Severe aortic stenosis 05/14/2014  . S/P TAVR (transcatheter aortic valve replacement) 05/14/2014  . Severe aortic insufficiency 04/05/2014  . Depression 10/02/2013  . Aortic atherosclerosis ([place]) 01/06/2013  . Paroxysmal atrial fibrillation ([place]) 01/06/2013  . Lower extremity edema 01/06/2013  . CKD (chronic kidney disease), stage III 01/06/2013  . Protein-calorie malnutrition, severe ([place]) 12/13/2012  . Hypothyroidism 02/27/2011  . Restless legs 02/27/2011  . Other pancytopenia ([place]) 12/06/2009  .  HYPERURICEMIA, ASYMPTOMATIC 07/25/2009  . HYPERGLYCEMIA, FASTING 01/05/2009  . CAD, NATIVE VESSEL 12/21/2008  . Type 2 diabetes mellitus with complication ([place]) [id number]  . Aortic valve disorder 05/15/2008  . ACTION TREMOR 01/14/2007  . POPLITEAL CYST, RIGHT 11/05/2006  . Gout 05/13/2006  . Thrombocytopenia ([place]) 05/13/2006  . ANXIETY 05/13/2006  . Essential hypertension 05/13/2006  . CVA 05/13/2006  . [place] DISEASE 05/13/2006  . Splenomegaly 05/13/2006   Past Surgical History:  Procedure Laterality Date  . AORTIC VALVE REPLACEMENT  01/23/2011   via minimally invasive approach per Dr Evelina Dun, [facility]  . CARDIAC CATHETERIZATION    . CARDIAC VALVE REPLACEMENT    . CHOLECYSTECTOMY    . EXCISIONAL HEMORRHOIDECTOMY    . INGUINAL HERNIA REPAIR Right   . joint effusion     left knee  . LESION REMOVAL Right 08/02/2015   Procedure: EXCISION  RIGHT EAR SKIN CANCER;  Surgeon: Rozetta Nunnery, MD;  Location: [place];  Service: ENT;  Laterality: Right;  LOCAL  . 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 SURGERY SCROTAL / TESTICULAR     "removed one" (01/05/2013)  . TEE WITHOUT CARDIOVERSION N/A 12/15/2012   Procedure: TRANSESOPHAGEAL ECHOCARDIOGRAM (TEE);  Surgeon: [name], MD;  Location: [place];  Service: Cardiovascular;  Laterality: N/A;  . TEE WITHOUT CARDIOVERSION N/A 03/26/2014   Procedure: TRANSESOPHAGEAL ECHOCARDIOGRAM (TEE);  Surgeon: Josue Hector, MD;  Location: [place];  Service: Cardiovascular;  Laterality: N/A;  . TEE WITHOUT CARDIOVERSION N/A 05/14/2014   Procedure: TRANSESOPHAGEAL ECHOCARDIOGRAM (TEE);  Surgeon: [name], MD;  Location: [place];  Service: Open Heart Surgery;  Laterality: N/A;  . TRANSCATHETER AORTIC VALVE REPLACEMENT, TRANSFEMORAL Right 05/14/2014   Procedure:  TRANSCATHETER AORTIC VALVE REPLACEMENT, TRANSFEMORAL;  Surgeon: [name], MD;  Location: [place];  Service: Open Heart Surgery;  Laterality: Right;    Family & Social History    Family History  Problem Relation Age of Onset  . Lung cancer Mother   . Heart disease Father   . Colon cancer Other   . Stroke Other   . Cancer Brother    Social History  Substance Use Topics  . Smoking status: Former Smoker    Packs/day: 0.75    Years: 24.00    Types: Cigarettes    Quit date: 01/11/1962  . Smokeless tobacco: Never Used  . Alcohol use No    Home Medications    Prior to Admission medications   Medication Sig Start Date End Date Taking? Authorizing Provider  allopurinol (ZYLOPRIM) 100 MG tablet Take 100 mg by mouth daily.   Yes Historical Provider, MD  amiodarone (PACERONE) 200 MG tablet Take 1 tablet by mouth daily Patient taking differently: Take 200 mg by mouth daily. Take 1 tablet by mouth daily 03/23/15  Yes [name], MD  Cholecalciferol (VITAMIN D) 1000 UNITS capsule Take 1 capsule (1,000 Units total) by mouth daily. 01/05/11  Yes Renella Cunas, MD  clonazePAM (KLONOPIN) 0.5 MG tablet Take 0.25 mg by mouth daily as needed (Take 1/2 tablet once daily as needed if you wake up early).   Yes Historical Provider, MD  colchicine 0.6 MG tablet Take 2 tabs once and then 1 tab one hour later for gout flare 09/27/15  Yes Binnie Rail, MD  ferrous sulfate 325 (65 FE) MG tablet Take 325 mg by mouth 2 (two) times daily with a meal.   Yes Historical Provider, MD  fish oil-omega-3 fatty acids 1000 MG capsule Take 1 g by mouth daily.     Yes Historical Provider, MD  Flaxseed, Linseed, (FLAX SEED OIL) 1000 MG CAPS Take 1 capsule by mouth daily.    Yes Historical Provider, MD  metolazone (ZAROXOLYN) 2.5 MG tablet Take 1 tablet (2.5 mg total) by mouth daily as needed (edema). 12/22/14  Yes [name], MD  Multiple Vitamin (MULTIVITAMIN) tablet Take 1 tablet by mouth daily.     Yes Historical  Provider, MD  omeprazole (PRILOSEC) 20 MG capsule TAKE 1 CAPSULE EVERY DAY  (REPLACES  PANTOPRAZOLE) 06/29/15  Yes [name], MD  potassium chloride (K-DUR) 10 MEQ tablet Take 1 tablet (10 mEq total) by mouth daily. 02/04/14  Yes [name], MD  propranolol (INDERAL) 10 MG tablet Take 1 tablet (10 mg total) by mouth 2 (two) times daily. 08/19/15  Yes Cherene Altes, MD  sertraline (ZOLOFT) 50 MG tablet TAKE 1 TABLET (50 MG TOTAL) BY MOUTH AT BEDTIME. 03/29/15  Yes Binnie Rail, MD  traMADol (ULTRAM) 50 MG tablet Take 50 mg by mouth every 8 (eight) hours as needed (pain). Reported on 06/28/2015   Yes Historical Provider, MD  vitamin C (ASCORBIC ACID) 500 MG tablet Take 1 tablet (500 mg total) by mouth daily. 01/05/11  Yes Renella Cunas, MD  furosemide (LASIX) 80 MG tablet Take 1 tablet (80 mg total) by mouth 2 (two) times daily. Take 80mg  in the morning and 40mg  in the afternoon 10/09/15   Verlee Monte, MD  levothyroxine (SYNTHROID, LEVOTHROID) 25 MCG tablet Take 25 mcg by mouth daily before breakfast.    Historical Provider, MD    Allergies    Penicillins and Neomycin-bacitracin zn-polymyx  I reviewed & agree with nursing's documentation on the patient's past medical, surgical, social & family histories as well as their allergies.  Review of Systems  Complete ROS obtained, and is negative except as stated in HPI.  Physical Exam  Updated Vital Signs BP (!) 91/54 (BP Location: Right Arm)   Pulse 90   Temp 97.4 F (36.3 C) (Oral)   Resp 18   Ht 5\' 10"  (1.778 m)   Wt 74.7 kg   SpO2 100%   BMI 23.63 kg/m  I have reviewed the triage vital signs and the nursing notes. Physical Exam CONST: Patient alert, well appearing, in no apparent distress.  EYES: PERRLA. EOMI. Conjunctiva w/o d/c. Lids AT w/o swelling.  ENMT: External Nares & Ears AT w/o swelling. Oropharynx patent. MM moist.  NECK: ROM full w/o rigidity. Trachea midline. JVD absent.  CVS: +S1/S2 w/o obvious murmur. Lower  extremities w/o pitting edema.  RESP: Respiratory effort unlabored w/o retractions & accessory muscle use. BS clear bilaterally.  GI: Soft & ND. +BS x 4. TTP absent. Hernia absent. Guarding & Rebound absent.  BACK: CVA TTP  absent bilaterally.  SKIN: Skin warm & dry. Turgor good. Diffuse erythema with significant warmth per the left lower extremity extending from the distal toes to 1-2 cm distal to the knee. No obvious knee effusion.   PSYCH: Alert. Oriented. Affect and mood appropriate.  NEURO: CN II-XII grossly intact. Motor exam symmetric w/ upper & lower extremities 5/5 bilaterally. Sensation grossly intact.  MSK: Joints located & stable, w/o obvious dislocation & obvious deformity or crepitus absent w/ Cap refill < 2 sec. Peripheral pulses 2+ & equal in all extremities.   ED Treatments & Results   Labs (only abnormal results are displayed) Labs Reviewed  URINE CULTURE - Abnormal; Notable for the following:       Result Value   Culture   (*)    Value: 20,000 COLONIES/mL KLEBSIELLA OXYTOCA 10,000 COLONIES/mL PROTEUS MIRABILIS SEE SEPARATE REPORT    Organism ID, Bacteria KLEBSIELLA OXYTOCA (*)    All other components within normal limits  COMPREHENSIVE METABOLIC PANEL - Abnormal; Notable for the following:    CO2 19 (*)    Glucose, Bld 139 (*)    BUN 36 (*)    Creatinine, Ser 1.84 (*)    Total Protein 6.0 (*)    AST 67 (*)    Total Bilirubin 1.8 (*)    GFR calc non Af Amer 30 (*)    GFR calc Af Amer 35 (*)    All other components within normal limits  CBC WITH DIFFERENTIAL/PLATELET - Abnormal; Notable for the following:    RBC 3.33 (*)    Hemoglobin 9.5 (*)    HCT 29.9 (*)    RDW 17.6 (*)    Platelets 25 (*)    Lymphs Abs 0.5 (*)    All other components within normal limits  BASIC METABOLIC PANEL - Abnormal; Notable for the following:    CO2 21 (*)    Glucose, Bld 129 (*)    BUN 33 (*)    Creatinine, Ser 1.77 (*)    Calcium 8.3 (*)    GFR calc non Af Amer 32 (*)     GFR calc Af Amer 37 (*)    All other components within normal limits  CBC - Abnormal; Notable for the following:    WBC 2.1 (*)    RBC 2.54 (*)    Hemoglobin 7.2 (*)    HCT 22.7 (*)    RDW 17.7 (*)    Platelets 21 (*)    All other components within normal limits  GLUCOSE, CAPILLARY - Abnormal; Notable for the following:    Glucose-Capillary 141 (*)    All other components within normal limits  BASIC METABOLIC PANEL - Abnormal; Notable for the following:    CO2 21 (*)    Glucose, Bld 155 (*)    BUN 40 (*)    Creatinine, Ser 1.92 (*)    Calcium 8.7 (*)    GFR calc non Af Amer 29 (*)    GFR calc Af Amer 33 (*)    All other components within normal limits  CBC - Abnormal; Notable for the following:    WBC 1.6 (*)    RBC 2.70 (*)    Hemoglobin 7.7 (*)    HCT 24.1 (*)    RDW 17.7 (*)    Platelets 25 (*)    All other components within normal limits  BASIC METABOLIC PANEL - Abnormal; Notable for the following:    Glucose, Bld 156 (*)    BUN 44 (*)  Creatinine, Ser 1.91 (*)    GFR calc non Af Amer 29 (*)    GFR calc Af Amer 34 (*)    All other components within normal limits  CBC - Abnormal; Notable for the following:    WBC 1.5 (*)    RBC 2.83 (*)    Hemoglobin 8.0 (*)    HCT 25.4 (*)    RDW 17.1 (*)    Platelets 28 (*)    All other components within normal limits  BASIC METABOLIC PANEL - Abnormal; Notable for the following:    Glucose, Bld 158 (*)    BUN 55 (*)    Creatinine, Ser 2.06 (*)    GFR calc non Af Amer 27 (*)    GFR calc Af Amer 31 (*)    All other components within normal limits  BASIC METABOLIC PANEL - Abnormal; Notable for the following:    Glucose, Bld 209 (*)    BUN 62 (*)    Creatinine, Ser 2.17 (*)    GFR calc non Af Amer 25 (*)    GFR calc Af Amer 29 (*)    All other components within normal limits  I-STAT CG4 LACTIC ACID, ED - Abnormal; Notable for the following:    Lactic Acid, Venous 3.16 (*)    All other components within normal limits    I-STAT CG4 LACTIC ACID, ED - Abnormal; Notable for the following:    Lactic Acid, Venous 2.12 (*)    All other components within normal limits  CBG MONITORING, ED - Abnormal; Notable for the following:    Glucose-Capillary 168 (*)    All other components within normal limits  CULTURE, BLOOD (ROUTINE X 2)  CULTURE, BLOOD (ROUTINE X 2)  C DIFFICILE QUICK SCREEN W PCR REFLEX  SUSCEPTIBILITY, AER + ANAEROB  SUSCEPTIBILITY RESULT  URINALYSIS, ROUTINE W REFLEX MICROSCOPIC (NOT AT [facility])  LACTIC ACID, PLASMA  LACTIC ACID, PLASMA  TYPE AND SCREEN    EKG    EKG Interpretation  Date/Time:    Ventricular Rate:    PR Interval:    QRS Duration:   QT Interval:    QTC Calculation:   R Axis:     Text Interpretation:         Radiology No results found.  Pertinent labs & imaging results that were available during my care of the patient were independently visualized by me and considered in my medical decision making, please see chart for details. Formal interpretation provided by Radiology.  Procedures (including critical care time) Procedures  Medications Ordered in ED Medications  sodium chloride 0.9 % bolus 1,000 mL (0 mLs Intravenous Stopped 10/04/15 1920)    And  sodium chloride 0.9 % bolus 1,000 mL (0 mLs Intravenous Stopped 10/04/15 1920)    And  sodium chloride 0.9 % bolus 500 mL (0 mLs Intravenous Stopped 10/04/15 1920)  vancomycin (VANCOCIN) IVPB 1000 mg/200 mL premix (0 mg Intravenous Stopped 10/04/15 1921)  ceFEPIme (MAXIPIME) 2 g in dextrose 5 % 50 mL IVPB (0 g Intravenous Stopped 10/04/15 1905)  potassium chloride SA (K-DUR,KLOR-CON) CR tablet 40 mEq (40 mEq Oral Given 10/05/15 1247)    Initial Impression & Plan / ED Course & Results / Final Disposition   Initial Impression & Plan Patient presents from urgent care for concern of worsening cellulitis of left lower extremity. Patient has no vital signs concerning for sepsis at this time however due to inflammatory  markers will treat for bacteremia and sent cultures along with  providing a full 30 mL per KG bolus of normal saline and starting the patient on cefepime and vancomycin per my review of patient's chart which reveals previous sensitivity to these 2 medications. Per exam no concern for necrotizing soft tissue infection and no abscess or wound to repair this time or drain. Pulses are intact and after consultation and conversation with family medicine service decision was made to admit the patient for further observation and long course of intravenous antibiotics.  Final Clinical Impression & ED Diagnoses   1. Left leg cellulitis   2. Acute on chronic diastolic CHF (congestive heart failure), NYHA class 1 ([place])   3. Aortic stenosis    Patient care discussed with the attending physician, Dr. Winfred Leeds, who oversaw their evaluation & treatment & voiced agreement.  Note: This document was prepared using Dragon voice recognition software and may include unintentional dictation errors.  House Officer: Voncille Lo, MD, Emergency Medicine Resident.    Voncille Lo, MD 10/22/15 [place], MD 10/22/15 1651

## 2015-10-24 ENCOUNTER — Telehealth: Payer: Self-pay | Admitting: Internal Medicine

## 2015-10-24 ENCOUNTER — Other Ambulatory Visit: Payer: Self-pay

## 2015-10-24 NOTE — Telephone Encounter (Signed)
Call him and see what his weight is the morning? Yesterday?

## 2015-10-24 NOTE — Patient Outreach (Signed)
Transition of care call:  Placed call to patient who reports that he is okay. States that he saw kidney doctor Friday. He reports that his weight is up 3 pounds of overnight.  Reports that he has taking metazone for 3 days. Reports lasix is increased to 80 mg twice a day.  Patient denies any shortness of breath. States that he has swelling of the abdomen.   Patient reports blood pressure of 103/61. CBG 210   PLAN: Placed called to primary MD office to inform about weight gain and ask for recommendation. Will continue weekly outreaches for transition of care.  [facility] CM Care Plan Problem One   Flowsheet Row Most Recent Value  Care Plan Problem One  Recent hospital admission related to cellulitis of leg  Role Documenting the Problem One  Care Management [place] for Problem One  Active  THN Long Term Goal (31-90 days)  Patient will report no readmissions in the next 31 days.  THN Long Term Goal Start Date  10/17/15  Interventions for Problem One Long Term Goal  PLACED CALL TO MD OFFICE TO REPORT WEIGHT GAIN  THN CM Short Term Goal #1 (0-30 days)  Patient will record CBG daily for the next 30 days.  THN CM Short Term Goal #1 Start Date  10/10/15  Interventions for Short Term Goal #1  Encouraged patient to avoid sweets and continue to monitor CBG daily. Review reasons to call Md for high or low readings.  THN CM Short Term Goal #2 (0-30 days)  Patient will call MD for worsening leg infection in the next 30 days.  THN CM Short Term Goal #2 Start Date  10/10/15  Interventions for Short Term Goal #2  Encouraged patient to continue to monitor legs for infection  THN CM Short Term Goal #3 (0-30 days)  Patient will report weighing daily for the next 30 days.  THN CM Short Term Goal #3 Start Date  10/10/15  Interventions for Short Tern Goal #3  Again reviewed heart failure zones and placed call to MD office to report weight gain.    Tomasa Rand, RN, BSN, CEN [facility] [employer] [phone]

## 2015-10-24 NOTE — Telephone Encounter (Signed)
Nothing further.  Fluid management per nephrology

## 2015-10-24 NOTE — Telephone Encounter (Signed)
[name] called to advise the following   Transition of care call:  Placed call to patient who reports that he is okay. States that he saw kidney doctor Friday. He reports that his weight is up 3 pounds of overnight.  Reports that he has taking metazone for 3 days. Reports lasix is increased to 80 mg twice a day.  Patient denies any shortness of breath. States that he has swelling of the abdomen.

## 2015-10-24 NOTE — Telephone Encounter (Signed)
Please advise if anything should be done.

## 2015-10-25 NOTE — Telephone Encounter (Signed)
No changes needed 

## 2015-10-25 NOTE — Telephone Encounter (Signed)
Will await renal evaluation and recommendations. thanks

## 2015-10-25 NOTE — Telephone Encounter (Signed)
10/24/2015--169 lbs  10/25/2015-- 167.4 lbs   Please advise if there are changes that should be made.

## 2015-10-26 NOTE — Telephone Encounter (Signed)
I spoke with the pt and he did follow up with Nephrology last Friday.  They increased the pt's Furosemide to 80mg  twice a day, increased metolazone to 2.5mg  30 minutes prior to morning and afternoon dosage of Furosemide everyday and increase potassium chloride to 12mEq daily.  [name] weight yesterday and today was 167. Next appointment with Nephrology is next Tuesday.  The pt is going to the beach tomorrow for a fishing trip and I instructed him to monitor his diet for salt intake.

## 2015-10-31 ENCOUNTER — Other Ambulatory Visit: Payer: Self-pay

## 2015-10-31 NOTE — Patient Outreach (Signed)
Transition of care call: Spoke with patient who reports that he is doing well this morning. States weight down to 161 today.  Reports that he is taking Lasix 80 mg twice a day and metolazone 2.5 mg before every dose of lasix. Reports that he has very little swelling to legs and no redness. Denies shortness of breath. Reports that he saw the "kidney" doctor on Friday and that he will go back to see him again on 11/02/2015 for follow. Patient stated that his kidney function had improved.   Patient continue to to be active with home health nursing and physical therapy.  Denies any new problems or concerns today.  PLAN: Will continue weekly outreach calls for transition of care. [facility] CM Care Plan Problem One   Flowsheet Row Most Recent Value  Care Plan Problem One  Recent hospital admission related to cellulitis of leg  Role Documenting the Problem One  Care Management [place] for Problem One  Active  THN Long Term Goal (31-90 days)  Patient will report no readmissions in the next 31 days.  THN Long Term Goal Start Date  10/17/15  Interventions for Problem One Long Term Goal  Encouraged patient to continue to monitor weight and take medications as prescribed.  THN CM Short Term Goal #1 (0-30 days)  Patient will record CBG daily for the next 30 days.  THN CM Short Term Goal #1 Start Date  10/10/15  Interventions for Short Term Goal #1  Encouraged patient to avoid sweets and continue to monitor CBG daily. Review reasons to call Md for high or low readings.  THN CM Short Term Goal #2 (0-30 days)  Patient will call MD for worsening leg infection in the next 30 days.  THN CM Short Term Goal #2 Start Date  10/10/15  Interventions for Short Term Goal #2  Reviewed reasons to call MF.  THN CM Short Term Goal #3 (0-30 days)  Patient will report weighing daily for the next 30 days.  THN CM Short Term Goal #3 Start Date  10/10/15  Interventions for Short Tern Goal #3  Reviewed heart failure  zones and discussed when to call MD for weight gain. Encouraged patient to follow his low salt diet.       Tomasa Rand, RN, BSN, CEN [facility] [employer] [phone]

## 2015-11-05 ENCOUNTER — Other Ambulatory Visit: Payer: Self-pay | Admitting: Cardiovascular Disease

## 2015-11-07 ENCOUNTER — Other Ambulatory Visit: Payer: Self-pay

## 2015-11-07 NOTE — Patient Outreach (Signed)
Transition of care: Placed call to patient who reports that he is doing better today than yesterday. Reports that his blood pressure was low yesterday and he did not feel good. States blood pressure was 86.  States that he drank 60 ounces of water. Reports today blood pressure of 106/62.  States that he continues to feel weak and has no bounced back to where he was before he got sick 2 months ago. States that he saw the kidney doctor last week and is going back again on 11/10/2015 for additional labs.   Patient reports for some reason his CBG is running about 200 fasting. States that he does not know why. Reports that he is not eating sweets. States that he has a "sweet taste in my mouth".   Reports no recent falls.. Denies feeling dizzy.  Reports that he remains active with home health physical therapy and home health nursing.   Patient reports that he continues to weigh daily. Reports today's weight of 158. Reports that is the lowest in a really long time.  Reports that he continues to take Lasix 80 mg twice a day and 3 potassium pills daily.  Denies any new problems or concerns other than higher than normal blood sugar readings.  Plan: Will continue weekly transition of care calls for 1 more week.  Tomasa Rand, RN, BSN, CEN [facility] [employer] [phone]

## 2015-11-07 NOTE — Telephone Encounter (Signed)
Would you like to refill this medication? Please advise °

## 2015-11-09 ENCOUNTER — Telehealth: Payer: Self-pay | Admitting: General Practice

## 2015-11-09 NOTE — Telephone Encounter (Signed)
Left message regarding pt appointment for 11/29/2015.

## 2015-11-14 ENCOUNTER — Other Ambulatory Visit: Payer: Self-pay

## 2015-11-14 ENCOUNTER — Ambulatory Visit: Payer: Self-pay

## 2015-11-14 NOTE — Patient Outreach (Signed)
Transition of care call: Placed call to patient. No answer. Left a message requesting a call back.  PLAN: Will continue to outreach patient for final transition of care call. Tomasa Rand, RN, BSN, CEN [facility] [employer] [phone]

## 2015-11-18 ENCOUNTER — Ambulatory Visit (INDEPENDENT_AMBULATORY_CARE_PROVIDER_SITE_OTHER): Payer: Commercial Managed Care - HMO | Admitting: Nurse Practitioner

## 2015-11-18 ENCOUNTER — Encounter: Payer: Self-pay | Admitting: Nurse Practitioner

## 2015-11-18 VITALS — BP 97/62 | HR 65 | Temp 97.7°F | Ht 70.0 in | Wt 169.0 lb

## 2015-11-18 DIAGNOSIS — K644 Residual hemorrhoidal skin tags: Secondary | ICD-10-CM | POA: Diagnosis not present

## 2015-11-18 NOTE — Patient Instructions (Signed)
Patient declined prescription for annusol  Advised patient to call office if changes his mind.  Hemorrhoids Hemorrhoids are swollen veins around the rectum or anus. There are two types of hemorrhoids:   Internal hemorrhoids. These occur in the veins just inside the rectum. They may poke through to the outside and become irritated and painful.  External hemorrhoids. These occur in the veins outside the anus and can be felt as a painful swelling or hard lump near the anus. CAUSES  Pregnancy.   Obesity.   Constipation or diarrhea.   Straining to have a bowel movement.   Sitting for long periods on the toilet.  Heavy lifting or other activity that caused you to strain.  Anal intercourse. SYMPTOMS   Pain.   Anal itching or irritation.   Rectal bleeding.   Fecal leakage.   Anal swelling.   One or more lumps around the anus.  DIAGNOSIS  Your caregiver may be able to diagnose hemorrhoids by visual examination. Other examinations or tests that may be performed include:   Examination of the rectal area with a gloved hand (digital rectal exam).   Examination of anal canal using a small tube (scope).   A blood test if you have lost a significant amount of blood.  A test to look inside the colon (sigmoidoscopy or colonoscopy). TREATMENT Most hemorrhoids can be treated at home. However, if symptoms do not seem to be getting better or if you have a lot of rectal bleeding, your caregiver may perform a procedure to help make the hemorrhoids get smaller or remove them completely. Possible treatments include:   Placing a rubber band at the base of the hemorrhoid to cut off the circulation (rubber band ligation).   Injecting a chemical to shrink the hemorrhoid (sclerotherapy).   Using a tool to burn the hemorrhoid (infrared light therapy).   Surgically removing the hemorrhoid (hemorrhoidectomy).   Stapling the hemorrhoid to block blood flow to the tissue  (hemorrhoid stapling).  HOME CARE INSTRUCTIONS   Eat foods with fiber, such as whole grains, beans, nuts, fruits, and vegetables. Ask your doctor about taking products with added fiber in them (fibersupplements).  Increase fluid intake. Drink enough water and fluids to keep your urine clear or pale yellow.   Exercise regularly.   Go to the bathroom when you have the urge to have a bowel movement. Do not wait.   Avoid straining to have bowel movements.   Keep the anal area dry and clean. Use wet toilet paper or moist towelettes after a bowel movement.   Medicated creams and suppositories may be used or applied as directed.   Only take over-the-counter or prescription medicines as directed by your caregiver.   Take warm sitz baths for 15-20 minutes, 3-4 times a day to ease pain and discomfort.   Place ice packs on the hemorrhoids if they are tender and swollen. Using ice packs between sitz baths may be helpful.   Put ice in a plastic bag.   Place a towel between your skin and the bag.   Leave the ice on for 15-20 minutes, 3-4 times a day.   Do not use a donut-shaped pillow or sit on the toilet for long periods. This increases blood pooling and pain.  SEEK MEDICAL CARE IF:  You have increasing pain and swelling that is not controlled by treatment or medicine.  You have uncontrolled bleeding.  You have difficulty or you are unable to have a bowel movement.  You have  pain or inflammation outside the area of the hemorrhoids. MAKE SURE YOU:  Understand these instructions.  Will watch your condition.  Will get help right away if you are not doing well or get worse.   This information is not intended to replace advice given to you by your health care provider. Make sure you discuss any questions you have with your health care provider.   Document Released: 12/23/1999 Document Revised: 12/12/2011 Document Reviewed: 10/30/2011 Elsevier Interactive Patient  Education [employer].

## 2015-11-18 NOTE — Progress Notes (Signed)
Subjective:  Patient ID: [name], male    DOB: Oct 16, 1922  Age: 82 y.o. MRN: [id number]  CC: Rectal Bleeding (Pt stated have clear mucus/bleeding in rectal area for 2 days.)   Rectal Bleeding   The current episode started 2 days ago. The onset was sudden. The problem occurs rarely. The problem has been resolved. The patient is experiencing no pain. The stool is described as soft. There was no prior successful therapy. Associated symptoms include hemorrhoids. Pertinent negatives include no anorexia, no fever, no abdominal pain, no diarrhea, no nausea, no rectal pain, no hematuria, no chest pain and no coughing. He has received no recent medical care.    Outpatient Medications Prior to Visit  Medication Sig Dispense Refill  . allopurinol (ZYLOPRIM) 100 MG tablet Take 100 mg by mouth daily.    [name] Kitchen amiodarone (PACERONE) 200 MG tablet TAKE 1 TABLET EVERY DAY 90 tablet 3  . Cholecalciferol (VITAMIN D) 1000 UNITS capsule Take 1 capsule (1,000 Units total) by mouth daily. 90 capsule 3  . clonazePAM (KLONOPIN) 0.5 MG tablet Take 0.25 mg by mouth daily as needed (Take 1/2 tablet once daily as needed if you wake up early).    . colchicine 0.6 MG tablet Take 2 tabs once and then 1 tab one hour later for gout flare 30 tablet 2  . ferrous sulfate 325 (65 FE) MG tablet Take 325 mg by mouth 2 (two) times daily with a meal.    . fish oil-omega-3 fatty acids 1000 MG capsule Take 1 g by mouth daily.      . Flaxseed, Linseed, (FLAX SEED OIL) 1000 MG CAPS Take 1 capsule by mouth daily.     . furosemide (LASIX) 80 MG tablet Take 1 tablet (80 mg total) by mouth 2 (two) times daily. Take 80mg  in the morning and 40mg  in the afternoon 60 tablet 6  . levothyroxine (SYNTHROID, LEVOTHROID) 25 MCG tablet TAKE 1 TABLET EVERY DAY 90 tablet 2  . metolazone (ZAROXOLYN) 2.5 MG tablet Take 1 tablet (2.5 mg total) by mouth daily as needed (edema). 30 tablet 3  . Multiple Vitamin (MULTIVITAMIN) tablet Take 1 tablet by mouth  daily.      [name] Kitchen omeprazole (PRILOSEC) 20 MG capsule TAKE 1 CAPSULE EVERY DAY  (REPLACES  PANTOPRAZOLE) 90 capsule 3  . potassium chloride (K-DUR) 10 MEQ tablet Take 1 tablet (10 mEq total) by mouth daily. (Patient taking differently: Take 10 mEq by mouth 3 (three) times daily. ) 90 tablet 1  . propranolol (INDERAL) 10 MG tablet Take 1 tablet (10 mg total) by mouth 2 (two) times daily. 180 tablet 3  . sertraline (ZOLOFT) 50 MG tablet TAKE 1 TABLET (50 MG TOTAL) BY MOUTH AT BEDTIME. 90 tablet 0  . traMADol (ULTRAM) 50 MG tablet Take 50 mg by mouth every 8 (eight) hours as needed (pain). Reported on 06/28/2015    . vitamin C (ASCORBIC ACID) 500 MG tablet Take 1 tablet (500 mg total) by mouth daily. 90 tablet 3   No facility-administered medications prior to visit.     ROS See HPI  Objective:  BP 97/62 (BP Location: Left Arm, Patient Position: Sitting, Cuff Size: Normal)   Pulse 65   Temp 97.7 F (36.5 C)   Ht 5\' 10"  (1.778 m)   Wt 169 lb (76.7 kg)   SpO2 99%   BMI 24.25 kg/m   BP Readings from Last 3 Encounters:  11/18/15 97/62  10/18/15 (!) 97/57  10/11/15 (!) 88/62  Wt Readings from Last 3 Encounters:  11/18/15 169 lb (76.7 kg)  11/07/15 158 lb (71.7 kg)  10/31/15 161 lb (73 kg)    Physical Exam  Constitutional: He is oriented to person, place, and time. No distress.  Cardiovascular: Normal rate.   Pulmonary/Chest: Effort normal.  Abdominal: Soft. Bowel sounds are normal. There is no tenderness.  Genitourinary: Rectal exam shows external hemorrhoid. Rectal exam shows no internal hemorrhoid, no mass, no tenderness and guaiac negative stool.  Neurological: He is alert and oriented to person, place, and time.  Vitals reviewed.   Lab Results  Component Value Date   WBC 4.7 10/18/2015   HGB 11.0 (L) 10/18/2015   HCT 32.0 (L) 10/18/2015   PLT 47 (L) 10/18/2015   GLUCOSE 198 (H) 10/14/2015   CHOL 84 09/21/2013   TRIG 94.0 09/21/2013   HDL 25.60 (L) 09/21/2013   LDLCALC  40 09/21/2013   ALT 23 10/11/2015   AST 29 10/11/2015   NA 129 (L) 10/14/2015   K 3.3 (L) 10/14/2015   CL 91 (L) 10/14/2015   CREATININE 2.58 (H) 10/14/2015   BUN 100 (HH) 10/14/2015   CO2 26 10/14/2015   TSH 2.664 05/18/2015   INR 1.32 10/04/2014   HGBA1C 5.4 09/27/2015    No results found.  Assessment & Plan:   [name] was seen today for rectal bleeding.  Diagnoses and all orders for this visit:  External hemorrhoids   I am having [name] maintain his fish oil-omega-3 fatty acids, Flax Seed Oil, multivitamin, vitamin C, Vitamin D, potassium chloride, ferrous sulfate, traMADol, metolazone, sertraline, allopurinol, clonazePAM, omeprazole, propranolol, colchicine, furosemide, amiodarone, and levothyroxine.  No orders of the defined types were placed in this encounter.   Follow-up: Return if symptoms worsen or fail to improve.  Wilfred Lacy, NP

## 2015-11-19 ENCOUNTER — Other Ambulatory Visit: Payer: Self-pay | Admitting: Internal Medicine

## 2015-11-21 ENCOUNTER — Other Ambulatory Visit: Payer: Self-pay

## 2015-11-21 NOTE — Patient Outreach (Signed)
Transition of care/ case closure: 11:15am placed call to patient. No answer. Left a message. No call back from last weeks attempt to reach patient. 11:30 am Placed call to daughter Brynda Rim. ( on consent) She reports that patient is doing well. Reports fluid levels are under good control and patient is doing the best he has done in the last 3 months. Reports no new problems. Reports patient has been hunting and active outside in his yard picking up pecans.   Reviewed transition of care and case closure with daughter. She reports that patient no longer needs our services but she would call us in the future if needed.  3:15pm  Patient returned call. I called patient back and he reports that he is doing very well. States that he continues to monitor his weight and call MD if needed.  Reports that he is maintaining weight 160-165 pounds. Reviewed with patient his successful completion of transition of care program.  Patient denies further needs and is in agreement with case closure. Patient has met all goals successfully.   PLAN: Will mail case closure to patient. Will send MD case closure. Will notify [facility] care management assistant of case closure for goals met.   Tomasa Rand, RN, BSN, CEN [facility] [employer] [phone]

## 2015-11-28 ENCOUNTER — Other Ambulatory Visit: Payer: Self-pay | Admitting: Internal Medicine

## 2015-11-28 ENCOUNTER — Other Ambulatory Visit: Payer: Self-pay | Admitting: Cardiovascular Disease

## 2015-11-28 DIAGNOSIS — I5033 Acute on chronic diastolic (congestive) heart failure: Secondary | ICD-10-CM

## 2015-11-28 DIAGNOSIS — I350 Nonrheumatic aortic (valve) stenosis: Secondary | ICD-10-CM

## 2015-11-29 ENCOUNTER — Telehealth: Payer: Self-pay | Admitting: Oncology

## 2015-11-29 ENCOUNTER — Other Ambulatory Visit (HOSPITAL_BASED_OUTPATIENT_CLINIC_OR_DEPARTMENT_OTHER): Payer: Commercial Managed Care - HMO

## 2015-11-29 ENCOUNTER — Ambulatory Visit (HOSPITAL_BASED_OUTPATIENT_CLINIC_OR_DEPARTMENT_OTHER): Payer: Commercial Managed Care - HMO | Admitting: Oncology

## 2015-11-29 VITALS — BP 95/54 | HR 76 | Temp 98.0°F | Resp 18 | Ht 70.0 in | Wt 168.7 lb

## 2015-11-29 DIAGNOSIS — N289 Disorder of kidney and ureter, unspecified: Secondary | ICD-10-CM | POA: Diagnosis not present

## 2015-11-29 DIAGNOSIS — D696 Thrombocytopenia, unspecified: Secondary | ICD-10-CM

## 2015-11-29 DIAGNOSIS — E119 Type 2 diabetes mellitus without complications: Secondary | ICD-10-CM | POA: Diagnosis not present

## 2015-11-29 DIAGNOSIS — D61818 Other pancytopenia: Secondary | ICD-10-CM

## 2015-11-29 LAB — CBC WITH DIFFERENTIAL/PLATELET
BASO%: 0.4 % (ref 0.0–2.0)
Basophils Absolute: 0 10*3/uL (ref 0.0–0.1)
EOS%: 1.8 % (ref 0.0–7.0)
Eosinophils Absolute: 0.1 10*3/uL (ref 0.0–0.5)
HEMATOCRIT: 32.6 % — AB (ref 38.4–49.9)
HGB: 11.1 g/dL — ABNORMAL LOW (ref 13.0–17.1)
LYMPH#: 0.6 10*3/uL — AB (ref 0.9–3.3)
LYMPH%: 19.9 % (ref 14.0–49.0)
MCH: 28.4 pg (ref 27.2–33.4)
MCHC: 34 g/dL (ref 32.0–36.0)
MCV: 83.4 fL (ref 79.3–98.0)
MONO#: 0.1 10*3/uL (ref 0.1–0.9)
MONO%: 5 % (ref 0.0–14.0)
NEUT%: 72.9 % (ref 39.0–75.0)
NEUTROS ABS: 2.1 10*3/uL (ref 1.5–6.5)
Platelets: 29 10*3/uL — ABNORMAL LOW (ref 140–400)
RBC: 3.91 10*6/uL — ABNORMAL LOW (ref 4.20–5.82)
RDW: 17.2 % — ABNORMAL HIGH (ref 11.0–14.6)
WBC: 2.8 10*3/uL — ABNORMAL LOW (ref 4.0–10.3)
nRBC: 0 % (ref 0–0)

## 2015-11-29 NOTE — Progress Notes (Signed)
  [place] OFFICE PROGRESS NOTE   Diagnosis: Thrombocytopenia  INTERVAL HISTORY:   [name] returns as scheduled. No recent infection. Good appetite.He fell a few days ago and developed skin tears at the left upper and lower arm. He reports there was minimal bleeding.   Objective:  Vital signs in last 24 hours:  Blood pressure (!) 95/54, pulse 76, temperature 98 F (36.7 C), temperature source Oral, resp. rate 18, height '5\' 10"'$  (1.778 m), weight 168 lb 11.2 oz (76.5 kg), SpO2 100 %.    HEENT: No thrush or bleeding Resp: Lungs clear bilaterally Cardio: Irregular GI: No hepatomegaly, the spleen is palpable in the left mid abdomen Vascular: Bilateral support stockings in place  Skin: Brown/purple discoloration throughout the arms bilaterally, superficial skin tears at the left upper and lower arm with dried blood.    Lab Results:  Lab Results  Component Value Date   WBC 2.8 (L) 11/29/2015   HGB 11.1 (L) 11/29/2015   HCT 32.6 (L) 11/29/2015   MCV 83.4 11/29/2015   PLT 29 (L) 11/29/2015   NEUTROABS 2.1 11/29/2015    Medications: I have reviewed the patient's current medications.  Assessment/Plan:  1.Chronic pancytopenia/splenomegaly-likely related to a chronic lymphoproliferative disorder,? Splenic lymphoma versus cirrhosis  Bone marrow biopsy at [facility] November 2012-slightly hypercellular marrow with trilineage hematopoiesis, interstitial Nieman-Pick like histiocytosis. Negative for dysplasia, negative for lymphoma, negative for increased blasts. 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 History of severe aortic stenosis, status post aortic valve replacement surgery at [facility] on 01/23/2011; status post transcatheter aortic valve replacement 05/14/2014  3. History of gout  4. Diabetes  5. Streptococcus bacteremia-TEE negative for endocarditis  6. Malaise-likely multifactorial 7. Renal insufficiency   8. Paroxysmal atrial fibrillation  9. Colon polyps noted on a virtual colonoscopy 02/10/2013  10. Anemia secondary to renal insufficiency and the chronic lymphoproliferative disorder. Trial of weekly erythropoietin initiated 04/09/2014 with improvement. The anemia progressed. Erythropoietin resumed 07/02/2014. Discontinued 09/29/2014 secondary to patient not experiencing clinical benefit 11. Right ear skin lesion-possibly a basal cell carcinoma, observe for now 12. Hospitalization with pyelonephritis in [place] April 2017 13. Admission 05/18/2015 with urosepsis 14. Admission 09/09/2015 through 09/23/2015 with left leg cellulitis 15. Admission 10/04/2015 through 10/09/2015 with left leg cellulitis    Disposition:  [name] appears stable. He has persistent thrombocytopenia. He will seek medical attention for bleeding. He plans to go to [place] for the holidays and will stay until February. He will return for an office visit and CBC in 3 months. We are available to see him sooner as needed.  Betsy Coder, MD  11/29/2015  12:41 PM

## 2015-11-29 NOTE — Telephone Encounter (Signed)
Appointments scheduled per 11/29/15 los. A copy of the AVS report and appointment schedule, was given to patient, per 11/29/15 los. °

## 2015-12-16 ENCOUNTER — Ambulatory Visit (INDEPENDENT_AMBULATORY_CARE_PROVIDER_SITE_OTHER): Payer: Commercial Managed Care - HMO | Admitting: Cardiovascular Disease

## 2015-12-16 ENCOUNTER — Encounter: Payer: Self-pay | Admitting: Cardiovascular Disease

## 2015-12-16 VITALS — BP 114/40 | HR 76 | Ht 70.0 in | Wt 169.8 lb

## 2015-12-16 DIAGNOSIS — I10 Essential (primary) hypertension: Secondary | ICD-10-CM

## 2015-12-16 DIAGNOSIS — I5032 Chronic diastolic (congestive) heart failure: Secondary | ICD-10-CM | POA: Diagnosis not present

## 2015-12-16 DIAGNOSIS — I359 Nonrheumatic aortic valve disorder, unspecified: Secondary | ICD-10-CM | POA: Diagnosis not present

## 2015-12-16 DIAGNOSIS — I480 Paroxysmal atrial fibrillation: Secondary | ICD-10-CM

## 2015-12-16 NOTE — Patient Instructions (Signed)
Medication Instructions:  Your physician recommends that you continue on your current medications as directed. Please refer to the Current Medication list given to you today.  Labwork: No new orders.   Testing/Procedures: No new orders.   Follow-Up: Your physician recommends that you schedule a follow-up appointment in: 3 MONTHS with Dr Cooper   Any Other Special Instructions Will Be Listed Below (If Applicable).     If you need a refill on your cardiac medications before your next appointment, please call your pharmacy.   

## 2015-12-16 NOTE — Progress Notes (Signed)
Cardiology Office Note Date:  12/16/2015   ID:  YAZN ETUE, DOB [birth year], MRN [id number]  PCP:  Binnie Rail, MD  Cardiologist:  [name], MD    Chief Complaint  Patient presents with  . Coronary Artery Disease     History of Present Illness: [name] is a 82 y.o. male who presents for Follow-up evaluation. The patient is followed for aortic valve disease status post surgical aortic valve replacement in 2013 and subsequent valve in valve TAVR in May 2016. Other problems include paroxysmal atrial fibrillation, chronic pancytopenia/splenomegaly, presumed chronic lymphoproliferative disorder, stroke, stage III chronic kidney disease, chronic diastolic heart failure, long-standing hypertension, type 2 diabetes, gout, and depression. He is coming up on the one-year anniversary of his wife's death. The last year has been quite difficult for him as they were married for over 51 years. He is still able to get out in his yard and do a little bit of light work. He brings in fresh pecans today that he shelled himself. Also brings in home made peanut brittle and soup.  The patient is doing okay. His chronic leg swelling is essentially unchanged. He denies orthopnea or PND. Has fatigue and exertional dyspnea that are unchanged. No chest pain, lightheadedness, or syncope. Has upcoming renal follow-up with Dr. Hollie Salk. Continues to follow regularly with Dr. Benay Spice.   Past Medical History:  Diagnosis Date  . Action tremor   . Anxiety   . Aortic stenosis    a. Previously severe -> s/p minimally invasive tissue AVR with Dr. Evelina Dun at [facility] 01/2011 (pre-AVR cath with no obs CAD);  b. 05/2014 s/p TAVR (23 mm Edwards Sapien 3).  . Bacteremia    a. 12/2012 - S bovis;  b. 12/2012 TEE w/o veg;  c. Seeing ID->Rocephin therapy extended to 01/25/2013 via PICC for possible endocarditis (No veg on TEE).  . Bradycardia   . Cellulitis 09/09/2015   LEFT LEG  . Cellulitis of left leg 10/11-16/2012  .  Chronic diastolic CHF (congestive heart failure) ([place])    a. 12/15/2012 TEE: EF 60-65%, no veg.  . CKD (chronic kidney disease), stage III   . Colon polyp   . Degenerative disc disease   . Diabetes mellitus    "borderline" (01/05/2013)  . Diverticulosis   . GERD (gastroesophageal reflux disease)   . Gout   . H/O hiatal hernia   . High cholesterol   . History of blood transfusion 01/2011; 11/2012  . Hypertension   . Hypotension   . Hypothyroidism   . Joint effusion, knee    left knee  . Leukopenia    Chronic pancytopenia  . PAF (paroxysmal atrial fibrillation) (HCC)    a. amiodarone therapy; not felt to be a candidate for anticoagulation with pancytopenia. b. Back in AF 09/2014 - decision was made to pursue rate control only.  . Pancytopenia ([place])    a. possible chronic lymphoproliferative disorder or splenic lymphoma.  . QT prolongation   . S/P TAVR (transcatheter aortic valve replacement)    a. 05/14/2014 TAVR: 23 mm Edwards Sapien 3 transcatheter heart valve placed valve-in-valve for prosthetic valve dysfunction via open right transfemoral approach  . Splenomegaly   . Stroke ([place])   . Synovial cyst of popliteal space   . Throat cancer ([place])    s/p lasered  . Thrombocytopenia (HCC)    Dr. Benay Spice    Past Surgical History:  Procedure Laterality Date  . AORTIC VALVE REPLACEMENT  01/23/2011   via  minimally invasive approach per Dr Evelina Dun, [facility]  . CARDIAC CATHETERIZATION    . CARDIAC VALVE REPLACEMENT    . CHOLECYSTECTOMY    . EXCISIONAL HEMORRHOIDECTOMY    . INGUINAL HERNIA REPAIR Right   . joint effusion     left knee  . LESION REMOVAL Right 08/02/2015   Procedure: EXCISION  RIGHT EAR SKIN CANCER;  Surgeon: Rozetta Nunnery, MD;  Location: [place];  Service: ENT;  Laterality: Right;  LOCAL  . 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 SURGERY SCROTAL / TESTICULAR     "removed one" (01/05/2013)  . TEE WITHOUT CARDIOVERSION N/A 12/15/2012   Procedure: TRANSESOPHAGEAL ECHOCARDIOGRAM (TEE);  Surgeon: [name], MD;  Location: [place];  Service: Cardiovascular;  Laterality: N/A;  . TEE WITHOUT CARDIOVERSION N/A 03/26/2014   Procedure: TRANSESOPHAGEAL ECHOCARDIOGRAM (TEE);  Surgeon: Josue Hector, MD;  Location: [place];  Service: Cardiovascular;  Laterality: N/A;  . TEE WITHOUT CARDIOVERSION N/A 05/14/2014   Procedure: TRANSESOPHAGEAL ECHOCARDIOGRAM (TEE);  Surgeon: [name], MD;  Location: [place];  Service: Open Heart Surgery;  Laterality: N/A;  . TRANSCATHETER AORTIC VALVE REPLACEMENT, TRANSFEMORAL Right 05/14/2014   Procedure: TRANSCATHETER AORTIC VALVE REPLACEMENT, TRANSFEMORAL;  Surgeon: [name], MD;  Location: [place];  Service: Open Heart Surgery;  Laterality: Right;    Current Outpatient Prescriptions  Medication Sig Dispense Refill  . allopurinol (ZYLOPRIM) 100 MG tablet Take 100 mg by mouth daily.    [name] Kitchen amiodarone (PACERONE) 200 MG tablet Take 200 mg by mouth daily.    . Cholecalciferol (VITAMIN D) 1000 UNITS capsule Take 1 capsule (1,000 Units total) by mouth daily. 90 capsule 3  . clonazePAM (KLONOPIN) 0.5 MG tablet Take 0.25 mg by mouth daily as needed (Take 1/2 tablet once daily as needed if you wake up early).    . COLCRYS 0.6 MG tablet Take 0.6 mg by mouth as directed. Take 2 tablets by mouth one time and then take 1 tablet by mouth one hour later for gout flare    . ferrous sulfate 325 (65 FE) MG tablet Take 325 mg by mouth 2 (two) times daily with a meal.    . fish oil-omega-3 fatty acids 1000 MG capsule Take 1 g by mouth daily.      . Flaxseed, Linseed, (FLAX SEED OIL) 1000 MG CAPS Take 1 capsule by mouth daily.     . furosemide (LASIX) 80 MG tablet Take 80 mg by mouth 2 (two) times daily.    [name] Kitchen levothyroxine (SYNTHROID, LEVOTHROID) 25 MCG tablet TAKE 1 TABLET  EVERY DAY 90 tablet 2  . metolazone (ZAROXOLYN) 2.5 MG tablet Take 1 tablet (2.5 mg total) by mouth daily as needed (edema). 30 tablet 3  . Multiple Vitamin (MULTIVITAMIN) tablet Take 1 tablet by mouth daily.      [name] Kitchen omeprazole (PRILOSEC) 20 MG capsule TAKE 1 CAPSULE EVERY DAY  (REPLACES  PANTOPRAZOLE) 90 capsule 3  . potassium chloride (K-DUR) 10 MEQ tablet Take 30 mEq by mouth every morning.    . propranolol (INDERAL) 10 MG tablet Take 1 tablet (10 mg total) by mouth 2 (two) times daily. 180 tablet 3  . sertraline (ZOLOFT) 50 MG tablet Take 50 mg by mouth at bedtime.    . traMADol (ULTRAM) 50 MG tablet Take 50 mg by mouth  every 8 (eight) hours as needed (pain). Reported on 06/28/2015    . vitamin C (ASCORBIC ACID) 500 MG tablet Take 1 tablet (500 mg total) by mouth daily. 90 tablet 3   No current facility-administered medications for this visit.     Allergies:   Penicillins and Neomycin-bacitracin zn-polymyx   Social History:  The patient  reports that he quit smoking about 53 years ago. His smoking use included Cigarettes. He has a 18.00 pack-year smoking history. He has never used smokeless tobacco. He reports that he does not drink alcohol or use drugs.   Family History:  [name]  family history includes Cancer in his brother; Colon cancer in his other; Heart disease in his father; Lung cancer in his mother; Stroke in his other.   ROS:  Please see the history of present illness.  Otherwise, review of systems is positive for easy bruising, fatigue.  All other systems are reviewed and negative.   PHYSICAL EXAM: VS:  BP (!) 114/40   Pulse 76   Ht 5\' 10"  (1.778 m)   Wt 169 lb 12.8 oz (77 kg)   BMI 24.36 kg/m  , BMI Body mass index is 24.36 kg/m. GEN: Pleasant elderly male, in no acute distress  HEENT: normal  Neck: no JVD, no masses. No carotid bruits Cardiac: RRR with 2/6 systolic murmur at the right upper sternal border              Respiratory:  clear to auscultation  bilaterally, normal work of breathing GI: soft, nontender, nondistended, + BS MS: no deformity or atrophy  Ext: 1+ bilateral pretibial edema, pedal pulses 2+= bilaterally Skin: warm and dry, no rash, diffuse ecchymoses Neuro:  Strength and sensation are intact Psych: euthymic mood, full affect  EKG:  EKG is not ordered today.  Recent Labs: 05/09/2015: Pro B Natriuretic peptide (BNP) 410.0 05/17/2015: B Natriuretic Peptide 674.3 05/18/2015: TSH 2.664 09/12/2015: Magnesium 2.0 10/11/2015: ALT 23 10/14/2015: BUN 100; Creatinine, Ser 2.58; Potassium 3.3; Sodium 129 11/29/2015: HGB 11.1; Platelets 29   Lipid Panel     Component Value Date/Time   CHOL 84 09/21/2013 0809   TRIG 94.0 09/21/2013 0809   HDL 25.60 (L) 09/21/2013 0809   CHOLHDL 3 09/21/2013 0809   VLDL 18.8 09/21/2013 0809   LDLCALC 40 09/21/2013 0809      Wt Readings from Last 3 Encounters:  12/16/15 169 lb 12.8 oz (77 kg)  11/29/15 168 lb 11.2 oz (76.5 kg)  11/18/15 169 lb (76.7 kg)     Cardiac Studies Reviewed: 2-D echocardiogram 05/30/2015: Study Conclusions  - Left ventricle: The cavity size was normal. There was moderate   concentric hypertrophy. Systolic function was normal. The   estimated ejection fraction was in the range of 60% to 65%. Wall   motion was normal; there were no regional wall motion   abnormalities. Doppler parameters are consistent with high   ventricular filling pressure. - Aortic valve: Transvalvular gradient is increased. A   bioprosthesis was present and functioning normally. There was no   regurgitation. Mean gradient (S): 25 mm Hg. Valve area (VTI):   1.29 cm^2. Valve area (Vmax): 1.13 cm^2. Valve area (Vmean): 1.32   cm^2. - Aorta: Aortic root dimension: 45 mm (ED). - Aortic root: The aortic root was moderately dilated. - Mitral valve: Severely calcified annulus. Mild diffuse   calcification of the anterior leaflet and posterior leaflet.   There was trivial regurgitation. Valve area  by pressure   half-time: 2.47  cm^2. Valve area by continuity equation (using   LVOT flow): 1.78 cm^2. - Left atrium: The atrium was severely dilated. - Tricuspid valve: There was mild regurgitation. - Pulmonary arteries: PA peak pressure: 35 mm Hg (S).  Impressions:  - Compared to study of 10/04/2014, the mean AV velocity has actually   decreased to 69mmHg (was 75mmHg). The right ventricular systolic   pressure was increased consistent with mild pulmonary   hypertension.   ASSESSMENT AND PLAN: 1.  Chronic diastolic heart failure, NYHA functional class II symptoms: The patient remained stable. I reviewed his weights and blood pressures today. He continues to use metolazone as needed but appears to be stable on furosemide 80 mg twice daily. No medication changes are made today.  2. Aortic valve disease status post valve and valve TAVR: Most recent echo reviewed with stable transvalvular gradients and normal LV function.  3. Paroxysmal atrial fibrillation: The patient is not a candidate for anticoagulation because of severe pancytopenia. He will continue on current medications.  Current medicines are reviewed with the patient today.  The patient does not have concerns regarding medicines.  Labs/ tests ordered today include:  No orders of the defined types were placed in this encounter.   Disposition:   FU 3 months  Signed, [name], MD  12/16/2015 9:19 AM    [facility] [address], [place], [place]  [address] Phone: [phone]; Fax: [phone]

## 2015-12-19 LAB — HM DIABETES EYE EXAM

## 2015-12-22 ENCOUNTER — Encounter: Payer: Self-pay | Admitting: Internal Medicine

## 2015-12-26 ENCOUNTER — Ambulatory Visit: Payer: Commercial Managed Care - HMO | Admitting: Internal Medicine

## 2015-12-29 ENCOUNTER — Ambulatory Visit (INDEPENDENT_AMBULATORY_CARE_PROVIDER_SITE_OTHER): Payer: Commercial Managed Care - HMO | Admitting: Internal Medicine

## 2015-12-29 ENCOUNTER — Encounter: Payer: Self-pay | Admitting: Internal Medicine

## 2015-12-29 VITALS — BP 110/64 | HR 88 | Temp 97.8°F | Resp 16 | Wt 169.0 lb

## 2015-12-29 DIAGNOSIS — K219 Gastro-esophageal reflux disease without esophagitis: Secondary | ICD-10-CM | POA: Diagnosis not present

## 2015-12-29 DIAGNOSIS — E118 Type 2 diabetes mellitus with unspecified complications: Secondary | ICD-10-CM

## 2015-12-29 DIAGNOSIS — I10 Essential (primary) hypertension: Secondary | ICD-10-CM | POA: Diagnosis not present

## 2015-12-29 DIAGNOSIS — E038 Other specified hypothyroidism: Secondary | ICD-10-CM

## 2015-12-29 DIAGNOSIS — M109 Gout, unspecified: Secondary | ICD-10-CM

## 2015-12-29 DIAGNOSIS — N183 Chronic kidney disease, stage 3 unspecified: Secondary | ICD-10-CM

## 2015-12-29 DIAGNOSIS — D61818 Other pancytopenia: Secondary | ICD-10-CM

## 2015-12-29 DIAGNOSIS — L03115 Cellulitis of right lower limb: Secondary | ICD-10-CM

## 2015-12-29 NOTE — Progress Notes (Signed)
Subjective:    Patient ID: [name], male    DOB: [birth year], 82 y.o.   MRN: [id number]  HPI The patient is here for follow up.  CKD, stage 3:He is following with Dr Hollie Salk.  His kidney function has been stable. He is taking lasix 80 mg twice a day and metolazone as needed.  He had increased edema last week and took the metolazone three times.  He thinks his weight is increasing again.  He knows when he needs to take the metolazone.    Cellulitis, right leg:  He had swelling, warmth and increased swelling in his right leg last week.  He saw Dr Hollie Salk and was placed on doxycycline.  He has taken the metolazone as needed.    Chronic diastolic dysfunction, aortic valve disease s/p replacement and repair, htn:  He is taking all of his medications as prescribed.   He saw cardiology not long ago.  He is not on any anticoagulation due to his thrombocytopenia.   His BP at home has been well controlled: 112/69-118/71, hr 80-90, weight - 163.3 - 12/19 - 161.8.  One day last week his SBP was in the 80's but he feels that was related to his infection.    Chronic pancytopenia, splenomegaly - likely chronic lymphoproliferative d/o:  He follows with Dr Benay Spice regularly.  He has been stable.  He denies any unusual bleeding.    Diabetes: He is not on any medication. He is compliant with a diabetic diet. He is very active and still does his yard work.  He checks his feet daily and denies foot lesions. He is up-to-date with an ophthalmology examination.   Depression, anxiety: He is taking his medication daily as prescribed. He denies any side effects from the medication. He feels his anxiety is well controlled, but he feels depressed.  His wife died one year ago and it is not getting any easier.  He is lonely.    Hypothyroidism:  He is taking his medication daily.  He denies any recent changes in energy or weight that are unexplained.   GERD:  He is taking his medication daily as prescribed.  He denies  any GERD symptoms and feels his GERD is well controlled.    Medications and allergies reviewed with patient and updated if appropriate.  Patient Active Problem List   Diagnosis Date Noted  . Cellulitis 10/05/2015  . Tenosynovitis of ankle 09/20/2015  . Hernia of abdominal cavity   . Lactic acidosis 09/17/2015  . Metabolic acidosis [id number]  . Hyperkalemia 09/14/2015  . Throat cancer ([place])   . Anemia due to bone marrow failure ([place])   . Recurrent cellulitis of lower leg 09/09/2015  . Lymphoproliferative disorder ([place])   . Urinary tract infection 05/18/2015  . QT prolongation   . GERD (gastroesophageal reflux disease) 10/04/2014  . Chronic diastolic congestive heart failure ([place]) 10/04/2014  . Impingement syndrome of right shoulder region 05/21/2014  . Severe aortic stenosis 05/14/2014  . S/P TAVR (transcatheter aortic valve replacement) 05/14/2014  . Severe aortic insufficiency 04/05/2014  . Depression 10/02/2013  . Aortic atherosclerosis ([place]) 01/06/2013  . Paroxysmal atrial fibrillation ([place]) 01/06/2013  . Lower extremity edema 01/06/2013  . CKD (chronic kidney disease), stage III 01/06/2013  . Protein-calorie malnutrition, severe ([place]) 12/13/2012  . Hypothyroidism 02/27/2011  . Restless legs 02/27/2011  . Other pancytopenia ([place]) 12/06/2009  . CAD, NATIVE VESSEL 12/21/2008  . Type 2 diabetes mellitus with complication ([place]) [id number]  . Aortic valve  disorder 05/15/2008  . ACTION TREMOR 01/14/2007  . POPLITEAL CYST, RIGHT 11/05/2006  . Gout 05/13/2006  . Thrombocytopenia ([place]) 05/13/2006  . ANXIETY 05/13/2006  . Essential hypertension 05/13/2006  . CVA 05/13/2006  . [place] DISEASE 05/13/2006  . Splenomegaly 05/13/2006    Current Outpatient Prescriptions on File Prior to Visit  Medication Sig Dispense Refill  . allopurinol (ZYLOPRIM) 100 MG tablet Take 100 mg by mouth daily.    [name] Kitchen amiodarone (PACERONE) 200 MG tablet Take 200 mg by mouth daily.    .  Cholecalciferol (VITAMIN D) 1000 UNITS capsule Take 1 capsule (1,000 Units total) by mouth daily. 90 capsule 3  . clonazePAM (KLONOPIN) 0.5 MG tablet Take 0.25 mg by mouth daily as needed (Take 1/2 tablet once daily as needed if you wake up early).    . COLCRYS 0.6 MG tablet Take 0.6 mg by mouth as directed. Take 2 tablets by mouth one time and then take 1 tablet by mouth one hour later for gout flare    . ferrous sulfate 325 (65 FE) MG tablet Take 325 mg by mouth 2 (two) times daily with a meal.    . fish oil-omega-3 fatty acids 1000 MG capsule Take 1 g by mouth daily.      . Flaxseed, Linseed, (FLAX SEED OIL) 1000 MG CAPS Take 1 capsule by mouth daily.     . furosemide (LASIX) 80 MG tablet Take 80 mg by mouth 2 (two) times daily.    [name] Kitchen levothyroxine (SYNTHROID, LEVOTHROID) 25 MCG tablet TAKE 1 TABLET EVERY DAY 90 tablet 2  . metolazone (ZAROXOLYN) 2.5 MG tablet Take 1 tablet (2.5 mg total) by mouth daily as needed (edema). 30 tablet 3  . Multiple Vitamin (MULTIVITAMIN) tablet Take 1 tablet by mouth daily.      [name] Kitchen omeprazole (PRILOSEC) 20 MG capsule TAKE 1 CAPSULE EVERY DAY  (REPLACES  PANTOPRAZOLE) 90 capsule 3  . potassium chloride (K-DUR) 10 MEQ tablet Take 30 mEq by mouth every morning.    . propranolol (INDERAL) 10 MG tablet Take 1 tablet (10 mg total) by mouth 2 (two) times daily. 180 tablet 3  . sertraline (ZOLOFT) 50 MG tablet Take 50 mg by mouth at bedtime.    . traMADol (ULTRAM) 50 MG tablet Take 50 mg by mouth every 8 (eight) hours as needed (pain). Reported on 06/28/2015    . vitamin C (ASCORBIC ACID) 500 MG tablet Take 1 tablet (500 mg total) by mouth daily. 90 tablet 3   No current facility-administered medications on file prior to visit.     Past Medical History:  Diagnosis Date  . Action tremor   . Anxiety   . Aortic stenosis    a. Previously severe -> s/p minimally invasive tissue AVR with Dr. Evelina Dun at [facility] 01/2011 (pre-AVR cath with no obs CAD);  b. 05/2014 s/p TAVR (23 mm  Edwards Sapien 3).  . Bacteremia    a. 12/2012 - S bovis;  b. 12/2012 TEE w/o veg;  c. Seeing ID->Rocephin therapy extended to 01/25/2013 via PICC for possible endocarditis (No veg on TEE).  . Bradycardia   . Cellulitis 09/09/2015   LEFT LEG  . Cellulitis of left leg 10/11-16/2012  . Chronic diastolic CHF (congestive heart failure) ([place])    a. 12/15/2012 TEE: EF 60-65%, no veg.  . CKD (chronic kidney disease), stage III   . Colon polyp   . Degenerative disc disease   . Diabetes mellitus    "borderline" (01/05/2013)  .  Diverticulosis   . GERD (gastroesophageal reflux disease)   . Gout   . H/O hiatal hernia   . High cholesterol   . History of blood transfusion 01/2011; 11/2012  . Hypertension   . Hypotension   . Hypothyroidism   . Joint effusion, knee    left knee  . Leukopenia    Chronic pancytopenia  . PAF (paroxysmal atrial fibrillation) (HCC)    a. amiodarone therapy; not felt to be a candidate for anticoagulation with pancytopenia. b. Back in AF 09/2014 - decision was made to pursue rate control only.  . Pancytopenia ([place])    a. possible chronic lymphoproliferative disorder or splenic lymphoma.  . QT prolongation   . S/P TAVR (transcatheter aortic valve replacement)    a. 05/14/2014 TAVR: 23 mm Edwards Sapien 3 transcatheter heart valve placed valve-in-valve for prosthetic valve dysfunction via open right transfemoral approach  . Splenomegaly   . Stroke ([place])   . Synovial cyst of popliteal space   . Throat cancer ([place])    s/p lasered  . Thrombocytopenia (HCC)    Dr. Benay Spice    Past Surgical History:  Procedure Laterality Date  . AORTIC VALVE REPLACEMENT  01/23/2011   via minimally invasive approach per Dr Evelina Dun, [facility]  . CARDIAC CATHETERIZATION    . CARDIAC VALVE REPLACEMENT    . CHOLECYSTECTOMY    . EXCISIONAL HEMORRHOIDECTOMY    . INGUINAL HERNIA REPAIR Right   . joint effusion     left knee  . LESION REMOVAL Right 08/02/2015   Procedure: EXCISION  RIGHT EAR SKIN  CANCER;  Surgeon: Rozetta Nunnery, MD;  Location: [place];  Service: ENT;  Laterality: Right;  LOCAL  . 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 SURGERY SCROTAL / TESTICULAR     "removed one" (01/05/2013)  . TEE WITHOUT CARDIOVERSION N/A 12/15/2012   Procedure: TRANSESOPHAGEAL ECHOCARDIOGRAM (TEE);  Surgeon: [name], MD;  Location: [place];  Service: Cardiovascular;  Laterality: N/A;  . TEE WITHOUT CARDIOVERSION N/A 03/26/2014   Procedure: TRANSESOPHAGEAL ECHOCARDIOGRAM (TEE);  Surgeon: Josue Hector, MD;  Location: [place];  Service: Cardiovascular;  Laterality: N/A;  . TEE WITHOUT CARDIOVERSION N/A 05/14/2014   Procedure: TRANSESOPHAGEAL ECHOCARDIOGRAM (TEE);  Surgeon: [name], MD;  Location: [place];  Service: Open Heart Surgery;  Laterality: N/A;  . TRANSCATHETER AORTIC VALVE REPLACEMENT, TRANSFEMORAL Right 05/14/2014   Procedure: TRANSCATHETER AORTIC VALVE REPLACEMENT, TRANSFEMORAL;  Surgeon: [name], MD;  Location: [place];  Service: Open Heart Surgery;  Laterality: Right;    Social History   Social History  . Marital status: Widowed    Spouse name: N/A  . Number of children: 3  . Years of education: N/A   Occupational History  . Retired Agricultural consultant Retired   Social History Main Topics  . Smoking status: Former Smoker    Packs/day: 0.75    Years: 24.00    Types: Cigarettes    Quit date: 01/11/1962  . Smokeless tobacco: Never Used  . Alcohol use No  . Drug use: No  . Sexual activity: No   Other Topics Concern  . Not on file   Social History Narrative   [place] farm outside of [place]   Lives with his wife   Smoked until 1964 about 7 cigarettes a day for 30  years   No alcohol history.   No drug history   Very active, no regimented exercise                Family History    Problem Relation Age of Onset  . Lung cancer Mother   . Heart disease Father   . Colon cancer Other   . Stroke Other   . Cancer Brother     Review of Systems  Constitutional: Positive for chills (prior to starting antibiotics). Negative for fever.  Respiratory: Negative for cough, shortness of breath and wheezing.   Cardiovascular: Positive for palpitations (occ) and leg swelling. Negative for chest pain.  Gastrointestinal: Negative for abdominal pain.  Neurological: Negative for light-headedness and headaches.       Objective:   Vitals:   12/29/15 0814  BP: 110/64  Pulse: 88  Resp: 16  Temp: 97.8 F (36.6 C)   Filed Weights   12/29/15 0814  Weight: 169 lb (76.7 kg)   Body mass index is 24.25 kg/m.   Physical Exam    Constitutional: Appears well-developed and well-nourished. No distress.  HENT:  Head: Normocephalic and atraumatic.  Neck: Neck supple. No tracheal deviation present. No thyromegaly present.  No cervical lymphadenopathy Cardiovascular: Normal rate, iregular rhythm and normal heart sounds.   3/6 systolic murmur heard. No carotid bruit .  1+ b/l LE edema- stable and improved compared to last visit Pulmonary/Chest: Effort normal and breath sounds normal. No respiratory distress. No has no wheezes. No rales.  Skin: Skin is warm and dry. Not diaphoretic.  skin with permanent bruising - legs, arms Psychiatric: depressed mood and affect. Behavior is normal.      Assessment & Plan:    See Problem List for Assessment and Plan of chronic medical problems.

## 2015-12-29 NOTE — Assessment & Plan Note (Signed)
Right lower extremity On doxycycline Improving He is monitoring his leg closely and will call with concerns Metolazone prn for edema

## 2015-12-29 NOTE — Assessment & Plan Note (Signed)
BP well controlled Current regimen effective and well tolerated Continue current medications at current doses  

## 2015-12-29 NOTE — Assessment & Plan Note (Signed)
Continue allopurinol 

## 2015-12-29 NOTE — Assessment & Plan Note (Signed)
Following with Dr Hollie Salk Fluid management per her Taking lasix 80 mg BID and metolazone prn

## 2015-12-29 NOTE — Assessment & Plan Note (Signed)
Management per Dr Benay Spice stable

## 2015-12-29 NOTE — Assessment & Plan Note (Signed)
GERD controlled Continue daily medication  

## 2015-12-29 NOTE — Patient Instructions (Addendum)
   All other Health Maintenance issues reviewed.   All recommended immunizations and age-appropriate screenings are up-to-date or discussed.  No immunizations administered today.   Medications reviewed and updated.  No changes recommended at this time.    Please followup in 6 months   

## 2015-12-29 NOTE — Assessment & Plan Note (Signed)
Active, compliant with diabetic diet a1c has been good - ? Reliable due to CKD Will try to avoid insulin Continue to monitor sugars at home

## 2015-12-29 NOTE — Progress Notes (Signed)
Pre visit review using our clinic review tool, if applicable. No additional management support is needed unless otherwise documented below in the visit note. 

## 2016-01-04 ENCOUNTER — Other Ambulatory Visit: Payer: Self-pay | Admitting: *Deleted

## 2016-01-04 ENCOUNTER — Telehealth: Payer: Self-pay | Admitting: Internal Medicine

## 2016-01-04 MED ORDER — METOLAZONE 2.5 MG PO TABS: 2.5000 mg | ORAL_TABLET | Freq: Every day | ORAL | 3 refills | Status: AC | PRN

## 2016-01-04 NOTE — Telephone Encounter (Signed)
[facility]  Patient Name: [name]  DOB: 1922/03/05    Initial Comment Caller's BP has been low for 2 days, it's 83/53 has been dizzy.    Nurse Assessment  Nurse: Harlow Mares, RN, Suanne Marker Date/Time Eilene Ghazi Time): 01/04/2016 1:33:33 PM  Confirm and document reason for call. If symptomatic, describe symptoms. ---Caller's BP has been low for 2 days, it's 83/53 has been dizzy.  Does the patient have any new or worsening symptoms? ---Yes  Will a triage be completed? ---Yes  Related visit to physician within the last 2 weeks? ---Yes  Does the PT have any chronic conditions? (i.e. diabetes, asthma, etc.) ---Yes  List chronic conditions. ---heart valve replacement; "bad kidneys"; high blood sugar  Is this a behavioral health or substance abuse call? ---No     Guidelines    Guideline Title Affirmed Question Affirmed Notes  Low Blood Pressure [id number] Systolic BP < 90 AND [id number] dizzy, lightheaded, or weak    Final Disposition User   Call EMS 911 Now Harlow Mares, RN, Suanne Marker    Disagree/Comply: Leta Baptist

## 2016-02-08 DIAGNOSIS — J40 Bronchitis, not specified as acute or chronic: Secondary | ICD-10-CM | POA: Diagnosis not present

## 2016-02-08 DIAGNOSIS — J069 Acute upper respiratory infection, unspecified: Secondary | ICD-10-CM | POA: Diagnosis not present

## 2016-02-08 DIAGNOSIS — J0100 Acute maxillary sinusitis, unspecified: Secondary | ICD-10-CM | POA: Diagnosis not present

## 2016-02-08 DIAGNOSIS — Z79899 Other long term (current) drug therapy: Secondary | ICD-10-CM | POA: Diagnosis not present

## 2016-02-08 DIAGNOSIS — Z6825 Body mass index (BMI) 25.0-25.9, adult: Secondary | ICD-10-CM | POA: Diagnosis not present

## 2016-02-11 DIAGNOSIS — E785 Hyperlipidemia, unspecified: Secondary | ICD-10-CM | POA: Diagnosis not present

## 2016-02-11 DIAGNOSIS — Z87891 Personal history of nicotine dependence: Secondary | ICD-10-CM | POA: Diagnosis not present

## 2016-02-11 DIAGNOSIS — Z952 Presence of prosthetic heart valve: Secondary | ICD-10-CM | POA: Diagnosis not present

## 2016-02-11 DIAGNOSIS — R0600 Dyspnea, unspecified: Secondary | ICD-10-CM | POA: Diagnosis not present

## 2016-02-11 DIAGNOSIS — E860 Dehydration: Secondary | ICD-10-CM | POA: Diagnosis not present

## 2016-02-11 DIAGNOSIS — A411 Sepsis due to other specified staphylococcus: Secondary | ICD-10-CM | POA: Diagnosis not present

## 2016-02-11 DIAGNOSIS — I130 Hypertensive heart and chronic kidney disease with heart failure and stage 1 through stage 4 chronic kidney disease, or unspecified chronic kidney disease: Secondary | ICD-10-CM | POA: Diagnosis not present

## 2016-02-11 DIAGNOSIS — I509 Heart failure, unspecified: Secondary | ICD-10-CM | POA: Diagnosis not present

## 2016-02-11 DIAGNOSIS — Z66 Do not resuscitate: Secondary | ICD-10-CM | POA: Diagnosis not present

## 2016-02-11 DIAGNOSIS — E039 Hypothyroidism, unspecified: Secondary | ICD-10-CM | POA: Diagnosis not present

## 2016-02-11 DIAGNOSIS — E872 Acidosis: Secondary | ICD-10-CM | POA: Diagnosis not present

## 2016-02-11 DIAGNOSIS — J189 Pneumonia, unspecified organism: Secondary | ICD-10-CM | POA: Diagnosis not present

## 2016-02-11 DIAGNOSIS — N179 Acute kidney failure, unspecified: Secondary | ICD-10-CM | POA: Diagnosis not present

## 2016-02-11 DIAGNOSIS — N184 Chronic kidney disease, stage 4 (severe): Secondary | ICD-10-CM | POA: Diagnosis not present

## 2016-02-11 DIAGNOSIS — R6521 Severe sepsis with septic shock: Secondary | ICD-10-CM | POA: Diagnosis not present

## 2016-02-11 DIAGNOSIS — A419 Sepsis, unspecified organism: Secondary | ICD-10-CM | POA: Diagnosis not present

## 2016-02-11 DIAGNOSIS — I350 Nonrheumatic aortic (valve) stenosis: Secondary | ICD-10-CM | POA: Diagnosis not present

## 2016-02-11 DIAGNOSIS — D65 Disseminated intravascular coagulation [defibrination syndrome]: Secondary | ICD-10-CM | POA: Diagnosis not present

## 2016-02-12 DIAGNOSIS — I480 Paroxysmal atrial fibrillation: Secondary | ICD-10-CM | POA: Diagnosis not present

## 2016-02-12 DIAGNOSIS — D649 Anemia, unspecified: Secondary | ICD-10-CM | POA: Diagnosis not present

## 2016-02-12 DIAGNOSIS — R6521 Severe sepsis with septic shock: Secondary | ICD-10-CM | POA: Diagnosis not present

## 2016-02-12 DIAGNOSIS — N179 Acute kidney failure, unspecified: Secondary | ICD-10-CM | POA: Diagnosis not present

## 2016-02-12 DIAGNOSIS — I509 Heart failure, unspecified: Secondary | ICD-10-CM | POA: Diagnosis not present

## 2016-02-12 DIAGNOSIS — R161 Splenomegaly, not elsewhere classified: Secondary | ICD-10-CM | POA: Diagnosis not present

## 2016-02-12 DIAGNOSIS — N184 Chronic kidney disease, stage 4 (severe): Secondary | ICD-10-CM | POA: Diagnosis not present

## 2016-02-12 DIAGNOSIS — N170 Acute kidney failure with tubular necrosis: Secondary | ICD-10-CM | POA: Diagnosis not present

## 2016-02-12 DIAGNOSIS — I350 Nonrheumatic aortic (valve) stenosis: Secondary | ICD-10-CM | POA: Diagnosis not present

## 2016-02-12 DIAGNOSIS — A419 Sepsis, unspecified organism: Secondary | ICD-10-CM | POA: Diagnosis not present

## 2016-02-12 DIAGNOSIS — I5032 Chronic diastolic (congestive) heart failure: Secondary | ICD-10-CM | POA: Diagnosis not present

## 2016-02-12 DIAGNOSIS — D696 Thrombocytopenia, unspecified: Secondary | ICD-10-CM | POA: Diagnosis not present

## 2016-02-12 DIAGNOSIS — I38 Endocarditis, valve unspecified: Secondary | ICD-10-CM | POA: Diagnosis not present

## 2016-02-12 DIAGNOSIS — R571 Hypovolemic shock: Secondary | ICD-10-CM | POA: Diagnosis not present

## 2016-02-12 DIAGNOSIS — N189 Chronic kidney disease, unspecified: Secondary | ICD-10-CM | POA: Diagnosis not present

## 2016-02-12 DIAGNOSIS — A411 Sepsis due to other specified staphylococcus: Secondary | ICD-10-CM | POA: Diagnosis not present

## 2016-02-13 DIAGNOSIS — I509 Heart failure, unspecified: Secondary | ICD-10-CM | POA: Diagnosis not present

## 2016-02-13 DIAGNOSIS — N179 Acute kidney failure, unspecified: Secondary | ICD-10-CM | POA: Diagnosis not present

## 2016-02-13 DIAGNOSIS — D696 Thrombocytopenia, unspecified: Secondary | ICD-10-CM | POA: Diagnosis not present

## 2016-02-13 DIAGNOSIS — N184 Chronic kidney disease, stage 4 (severe): Secondary | ICD-10-CM | POA: Diagnosis not present

## 2016-02-13 DIAGNOSIS — I480 Paroxysmal atrial fibrillation: Secondary | ICD-10-CM | POA: Diagnosis not present

## 2016-02-13 DIAGNOSIS — I350 Nonrheumatic aortic (valve) stenosis: Secondary | ICD-10-CM | POA: Diagnosis not present

## 2016-02-13 DIAGNOSIS — I38 Endocarditis, valve unspecified: Secondary | ICD-10-CM | POA: Diagnosis not present

## 2016-02-13 DIAGNOSIS — N170 Acute kidney failure with tubular necrosis: Secondary | ICD-10-CM | POA: Diagnosis not present

## 2016-02-13 DIAGNOSIS — R571 Hypovolemic shock: Secondary | ICD-10-CM | POA: Diagnosis not present

## 2016-02-13 DIAGNOSIS — I5032 Chronic diastolic (congestive) heart failure: Secondary | ICD-10-CM | POA: Diagnosis not present

## 2016-02-13 DIAGNOSIS — I360 Nonrheumatic tricuspid (valve) stenosis: Secondary | ICD-10-CM | POA: Diagnosis not present

## 2016-02-13 DIAGNOSIS — N189 Chronic kidney disease, unspecified: Secondary | ICD-10-CM | POA: Diagnosis not present

## 2016-02-13 DIAGNOSIS — R161 Splenomegaly, not elsewhere classified: Secondary | ICD-10-CM | POA: Diagnosis not present

## 2016-02-13 DIAGNOSIS — A411 Sepsis due to other specified staphylococcus: Secondary | ICD-10-CM | POA: Diagnosis not present

## 2016-02-13 DIAGNOSIS — R6521 Severe sepsis with septic shock: Secondary | ICD-10-CM | POA: Diagnosis not present

## 2016-02-13 DIAGNOSIS — I959 Hypotension, unspecified: Secondary | ICD-10-CM | POA: Diagnosis not present

## 2016-02-13 DIAGNOSIS — D649 Anemia, unspecified: Secondary | ICD-10-CM | POA: Diagnosis not present

## 2016-02-13 DIAGNOSIS — A419 Sepsis, unspecified organism: Secondary | ICD-10-CM | POA: Diagnosis not present

## 2016-02-13 DIAGNOSIS — I348 Other nonrheumatic mitral valve disorders: Secondary | ICD-10-CM | POA: Diagnosis not present

## 2016-02-14 DIAGNOSIS — R6521 Severe sepsis with septic shock: Secondary | ICD-10-CM | POA: Diagnosis not present

## 2016-02-14 DIAGNOSIS — J40 Bronchitis, not specified as acute or chronic: Secondary | ICD-10-CM | POA: Diagnosis not present

## 2016-02-14 DIAGNOSIS — I38 Endocarditis, valve unspecified: Secondary | ICD-10-CM | POA: Diagnosis not present

## 2016-02-14 DIAGNOSIS — D473 Essential (hemorrhagic) thrombocythemia: Secondary | ICD-10-CM | POA: Diagnosis not present

## 2016-02-14 DIAGNOSIS — I480 Paroxysmal atrial fibrillation: Secondary | ICD-10-CM | POA: Diagnosis not present

## 2016-02-14 DIAGNOSIS — E872 Acidosis: Secondary | ICD-10-CM | POA: Diagnosis not present

## 2016-02-14 DIAGNOSIS — D649 Anemia, unspecified: Secondary | ICD-10-CM | POA: Diagnosis not present

## 2016-02-14 DIAGNOSIS — R161 Splenomegaly, not elsewhere classified: Secondary | ICD-10-CM | POA: Diagnosis not present

## 2016-02-14 DIAGNOSIS — N170 Acute kidney failure with tubular necrosis: Secondary | ICD-10-CM | POA: Diagnosis not present

## 2016-02-14 DIAGNOSIS — I959 Hypotension, unspecified: Secondary | ICD-10-CM | POA: Diagnosis not present

## 2016-02-14 DIAGNOSIS — A411 Sepsis due to other specified staphylococcus: Secondary | ICD-10-CM | POA: Diagnosis not present

## 2016-02-14 DIAGNOSIS — N189 Chronic kidney disease, unspecified: Secondary | ICD-10-CM | POA: Diagnosis not present

## 2016-02-14 DIAGNOSIS — D696 Thrombocytopenia, unspecified: Secondary | ICD-10-CM | POA: Diagnosis not present

## 2016-02-14 DIAGNOSIS — A419 Sepsis, unspecified organism: Secondary | ICD-10-CM | POA: Diagnosis not present

## 2016-02-14 DIAGNOSIS — I509 Heart failure, unspecified: Secondary | ICD-10-CM | POA: Diagnosis not present

## 2016-02-14 DIAGNOSIS — N179 Acute kidney failure, unspecified: Secondary | ICD-10-CM | POA: Diagnosis not present

## 2016-02-15 DIAGNOSIS — N189 Chronic kidney disease, unspecified: Secondary | ICD-10-CM | POA: Diagnosis not present

## 2016-02-15 DIAGNOSIS — D696 Thrombocytopenia, unspecified: Secondary | ICD-10-CM | POA: Diagnosis not present

## 2016-02-15 DIAGNOSIS — I959 Hypotension, unspecified: Secondary | ICD-10-CM | POA: Diagnosis not present

## 2016-02-15 DIAGNOSIS — R161 Splenomegaly, not elsewhere classified: Secondary | ICD-10-CM | POA: Diagnosis not present

## 2016-02-15 DIAGNOSIS — J40 Bronchitis, not specified as acute or chronic: Secondary | ICD-10-CM | POA: Diagnosis not present

## 2016-02-28 ENCOUNTER — Ambulatory Visit: Payer: Commercial Managed Care - HMO | Admitting: Nurse Practitioner

## 2016-02-28 ENCOUNTER — Other Ambulatory Visit: Payer: Commercial Managed Care - HMO

## 2016-03-08 DEATH — deceased

## 2016-03-19 ENCOUNTER — Ambulatory Visit: Payer: Commercial Managed Care - HMO | Admitting: Cardiovascular Disease

## 2016-06-27 ENCOUNTER — Ambulatory Visit: Payer: Commercial Managed Care - HMO | Admitting: Internal Medicine

## 2017-03-23 IMAGING — MR MR ANKLE*L* WO/W CM
4 of 6 series · 19 of 40 positions shown · non-contrast
Comparison: MRI left ankle 09/13/2015. MRI left lower leg
09/13/2015 and this same day.

CLINICAL DATA: Recurrent left lower extremity cellulitis and
patient with lymphoproliferative disease and pancytopenia.

EXAM:
MRI OF THE LEFT ANKLE WITHOUT CONTRAST
TECHNIQUE: Multiplanar, multisequence MR imaging of the ankle was performed. No
intravenous contrast was administered.

[Series 6: PD fat-sat · axial · 4.0mm · 0.33mm/px · z∈[-84,+55]mm · 7 of 32 slices shown]
[im 1/32]
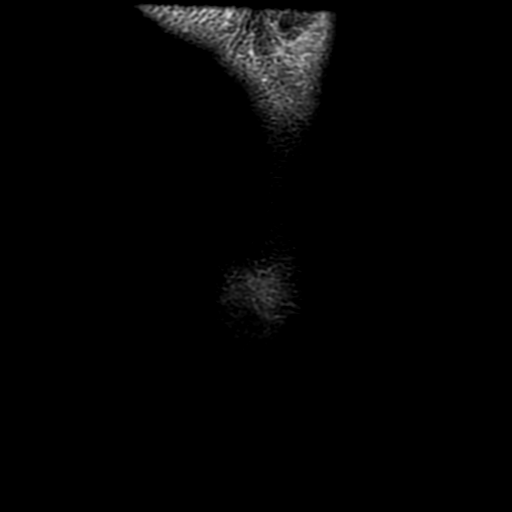
[im 6/32]
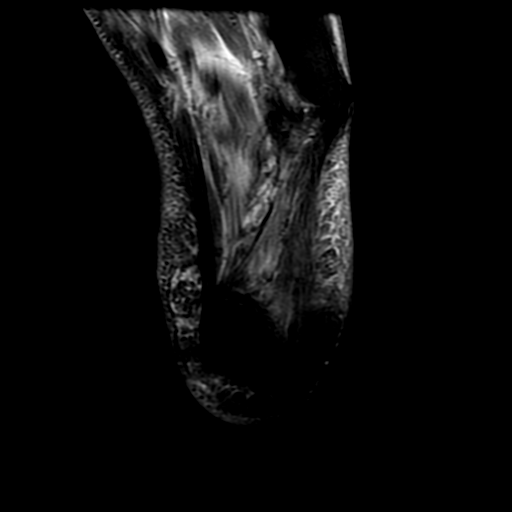
[im 11/32]
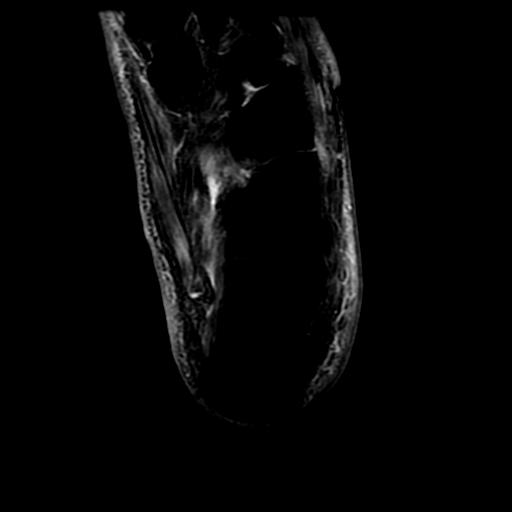
[im 16/32]
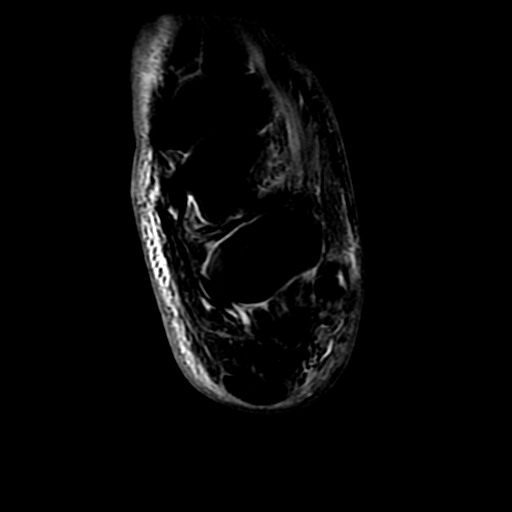
[im 21/32]
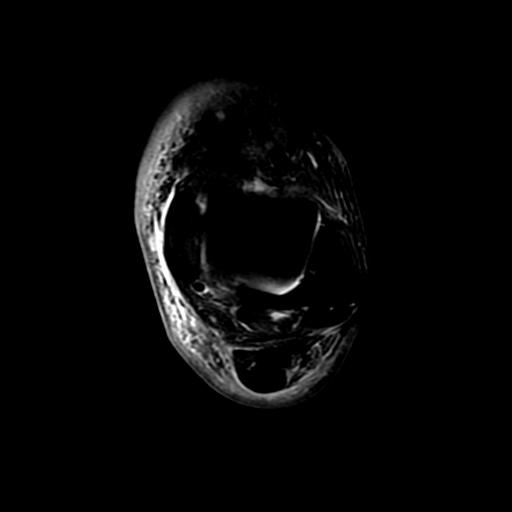
[im 26/32]
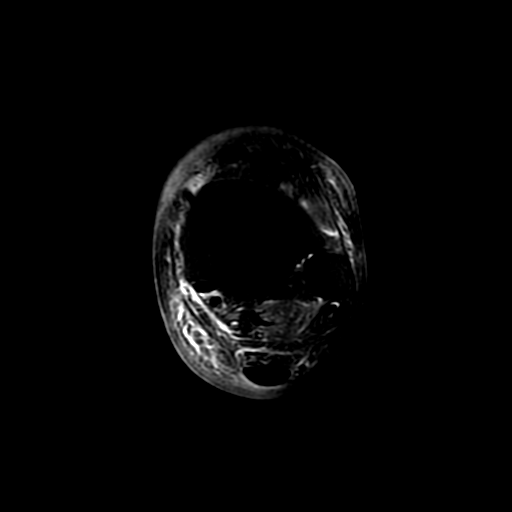
[im 32/32]
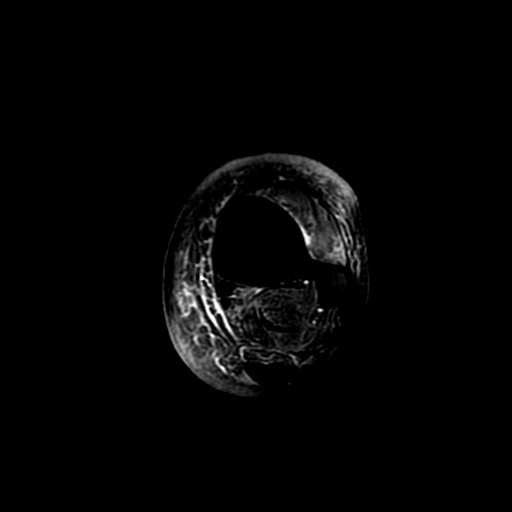

[Series 7: T2 fat-sat · axial · 4.0mm · 0.33mm/px · z∈[-62,+28]mm · 3 of 32 slices shown (1 of 2)]
[im 6/32]
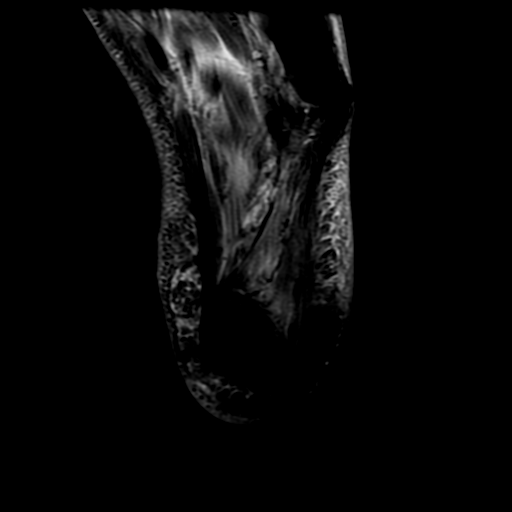
[im 16/32]
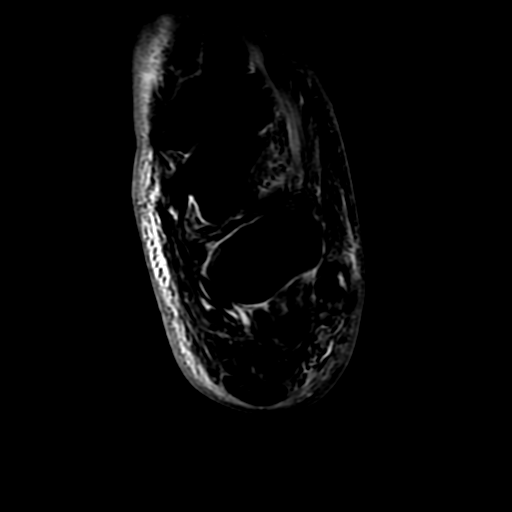
[im 26/32]
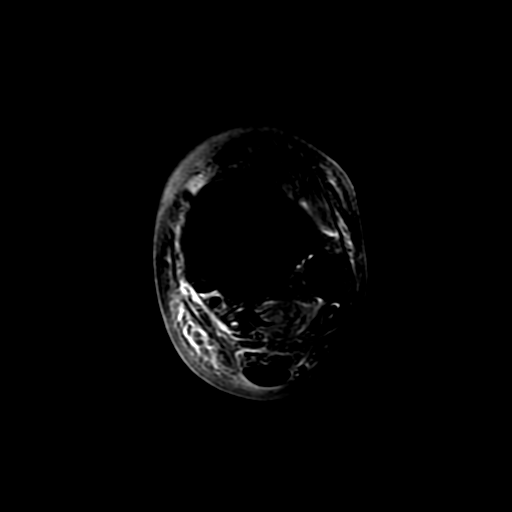

[Series 8: PD · axial · 4.0mm · 0.33mm/px · z∈[-84,+28]mm · 6 of 32 slices shown]
[im 1/32]
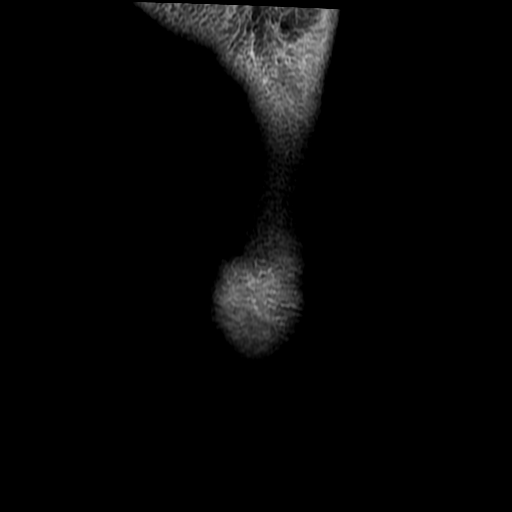
[im 6/32]
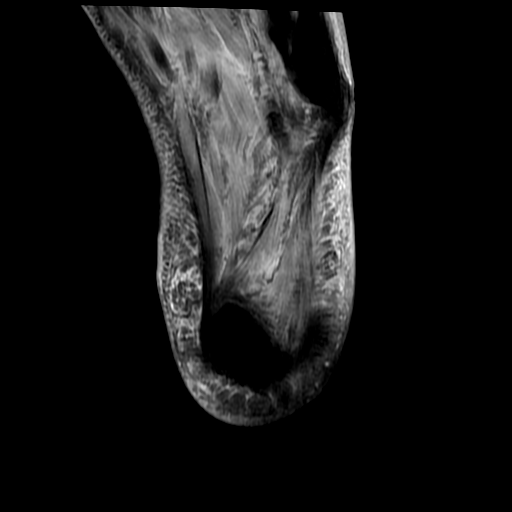
[im 11/32]
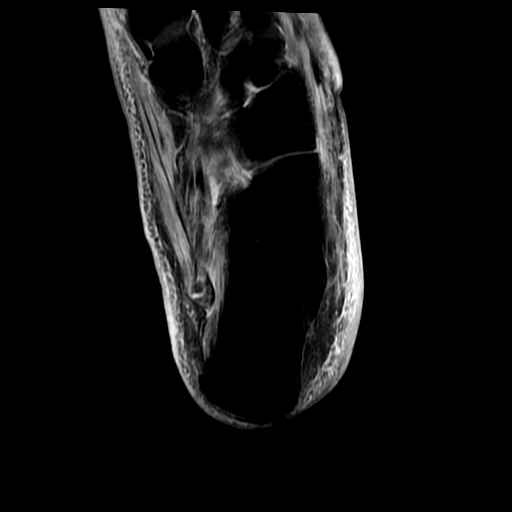
[im 16/32]
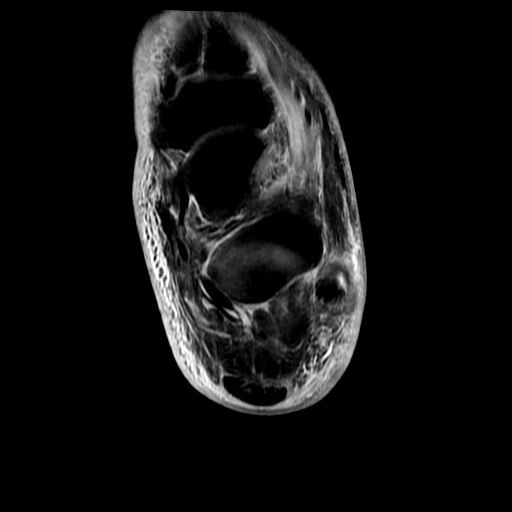
[im 21/32]
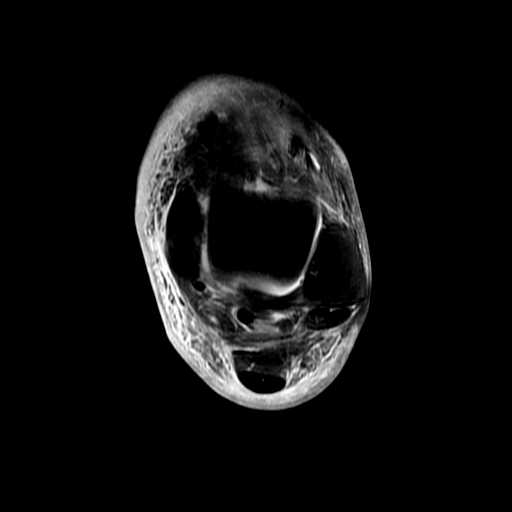
[im 26/32]
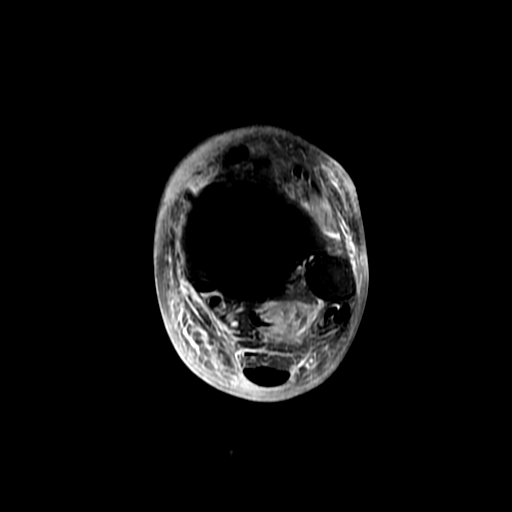

[Series 9: T2 fat-sat · coronal · 3.0mm · 0.31mm/px · 3 of 37 slices shown (2 of 2)]
[im 5/37]
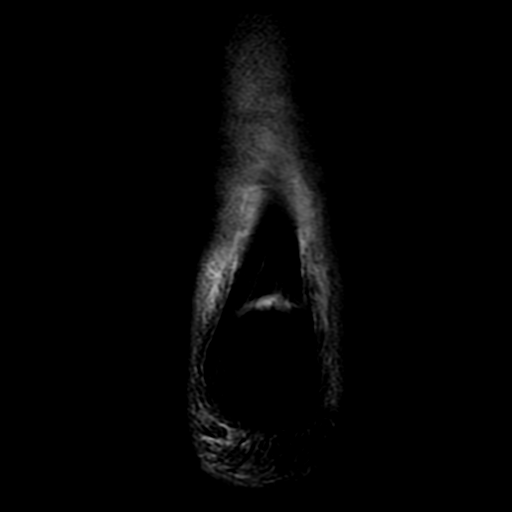
[im 19/37]
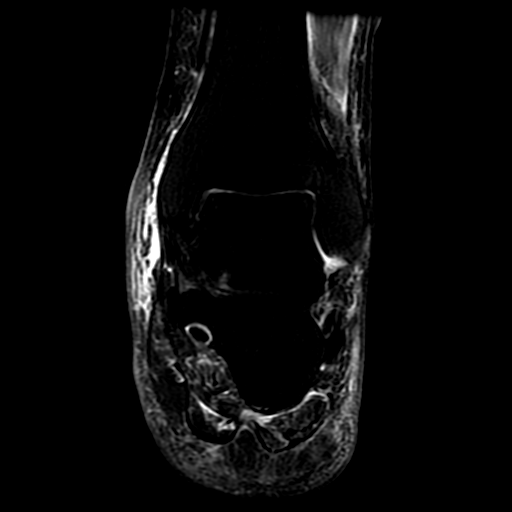
[im 32/37]
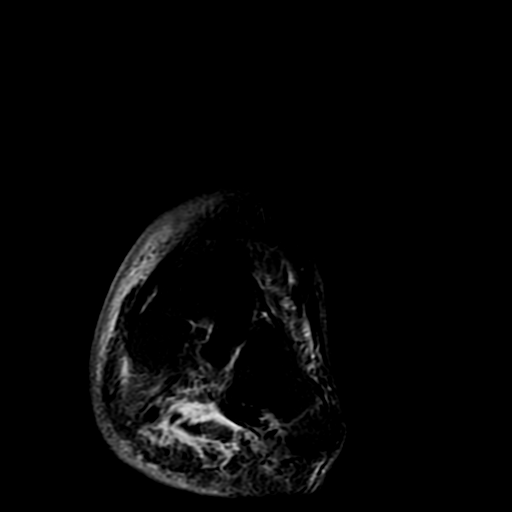

[19 of 40 positions shown; findings below may reference images not displayed]

FINDINGS: Subcutaneous edema without focal fluid collection is seen about the
ankle and imaged foot.

TENDONS

Peroneal: Intact.

Posteromedial: Intact. A small amount of fluid about the
posteromedial tendons is not notably changed.

Anterior: Intact.

Achilles: Intact.

Plantar Fascia: Unremarkable.

LIGAMENTS

Lateral: Intact.

Medial: Intact.

CARTILAGE

Ankle Joint: Unremarkable.

Subtalar Joints/Sinus Tarsi: Unremarkable.

Bones: Normal marrow signal throughout.

Other: None.
IMPRESSION: Subcutaneous edema about the ankle and foot could be due to
dependent change or cellulitis. Negative for abscess or
osteomyelitis.

Small amount of fluid about the posteromedial tendons is compatible
with tenosynovitis and unchanged.

Mild edema in the flexor hallucis longus muscle as seen on MRI left
lower leg this same day.
